# Patient Record
Sex: Male | Born: 1940 | Race: White | Hispanic: No | Marital: Married | Smoking: Former smoker
Health system: Southern US, Community
[De-identification: ages and names within clinical notes are randomized; demographics above are authoritative.]

## PROBLEM LIST (undated history)

## (undated) DIAGNOSIS — D696 Thrombocytopenia, unspecified: Secondary | ICD-10-CM

## (undated) DIAGNOSIS — K56609 Unspecified intestinal obstruction, unspecified as to partial versus complete obstruction: Secondary | ICD-10-CM

## (undated) DIAGNOSIS — R011 Cardiac murmur, unspecified: Secondary | ICD-10-CM

## (undated) DIAGNOSIS — R519 Headache, unspecified: Secondary | ICD-10-CM

## (undated) DIAGNOSIS — J439 Emphysema, unspecified: Secondary | ICD-10-CM

## (undated) DIAGNOSIS — I2510 Atherosclerotic heart disease of native coronary artery without angina pectoris: Secondary | ICD-10-CM

## (undated) DIAGNOSIS — Z8679 Personal history of other diseases of the circulatory system: Secondary | ICD-10-CM

## (undated) DIAGNOSIS — K7460 Unspecified cirrhosis of liver: Secondary | ICD-10-CM

## (undated) DIAGNOSIS — R51 Headache: Secondary | ICD-10-CM

## (undated) DIAGNOSIS — I5033 Acute on chronic diastolic (congestive) heart failure: Secondary | ICD-10-CM

## (undated) DIAGNOSIS — Z860100 Personal history of colon polyps, unspecified: Secondary | ICD-10-CM

## (undated) DIAGNOSIS — M1990 Unspecified osteoarthritis, unspecified site: Secondary | ICD-10-CM

## (undated) DIAGNOSIS — K5790 Diverticulosis of intestine, part unspecified, without perforation or abscess without bleeding: Secondary | ICD-10-CM

## (undated) DIAGNOSIS — K4090 Unilateral inguinal hernia, without obstruction or gangrene, not specified as recurrent: Secondary | ICD-10-CM

## (undated) DIAGNOSIS — I4891 Unspecified atrial fibrillation: Secondary | ICD-10-CM

## (undated) DIAGNOSIS — Z9289 Personal history of other medical treatment: Secondary | ICD-10-CM

## (undated) DIAGNOSIS — D649 Anemia, unspecified: Secondary | ICD-10-CM

## (undated) DIAGNOSIS — E785 Hyperlipidemia, unspecified: Secondary | ICD-10-CM

## (undated) DIAGNOSIS — C3490 Malignant neoplasm of unspecified part of unspecified bronchus or lung: Secondary | ICD-10-CM

## (undated) DIAGNOSIS — J449 Chronic obstructive pulmonary disease, unspecified: Secondary | ICD-10-CM

## (undated) DIAGNOSIS — K420 Umbilical hernia with obstruction, without gangrene: Secondary | ICD-10-CM

## (undated) DIAGNOSIS — I714 Abdominal aortic aneurysm, without rupture, unspecified: Secondary | ICD-10-CM

## (undated) DIAGNOSIS — K219 Gastro-esophageal reflux disease without esophagitis: Secondary | ICD-10-CM

## (undated) DIAGNOSIS — Z923 Personal history of irradiation: Secondary | ICD-10-CM

## (undated) DIAGNOSIS — E039 Hypothyroidism, unspecified: Secondary | ICD-10-CM

## (undated) DIAGNOSIS — L719 Rosacea, unspecified: Secondary | ICD-10-CM

## (undated) DIAGNOSIS — K2960 Other gastritis without bleeding: Secondary | ICD-10-CM

## (undated) DIAGNOSIS — S0990XA Unspecified injury of head, initial encounter: Secondary | ICD-10-CM

## (undated) DIAGNOSIS — N400 Enlarged prostate without lower urinary tract symptoms: Secondary | ICD-10-CM

## (undated) DIAGNOSIS — I219 Acute myocardial infarction, unspecified: Secondary | ICD-10-CM

## (undated) DIAGNOSIS — Z9221 Personal history of antineoplastic chemotherapy: Secondary | ICD-10-CM

## (undated) DIAGNOSIS — R0602 Shortness of breath: Secondary | ICD-10-CM

## (undated) HISTORY — DX: Other gastritis without bleeding: K29.60

## (undated) HISTORY — DX: Malignant neoplasm of unspecified part of unspecified bronchus or lung: C34.90

## (undated) HISTORY — DX: Rosacea, unspecified: L71.9

## (undated) HISTORY — PX: CATARACT EXTRACTION, BILATERAL: SHX1313

## (undated) HISTORY — PX: COLONOSCOPY W/ BIOPSIES: SHX1374

## (undated) HISTORY — DX: Unspecified cirrhosis of liver: K74.60

## (undated) HISTORY — PX: JOINT REPLACEMENT: SHX530

## (undated) HISTORY — DX: Diverticulosis of intestine, part unspecified, without perforation or abscess without bleeding: K57.90

## (undated) HISTORY — PX: TONSILLECTOMY: SUR1361

## (undated) HISTORY — DX: Personal history of irradiation: Z92.3

## (undated) HISTORY — PX: HERNIA REPAIR: SHX51

## (undated) HISTORY — DX: Personal history of other medical treatment: Z92.89

## (undated) HISTORY — DX: Gastro-esophageal reflux disease without esophagitis: K21.9

## (undated) HISTORY — DX: Abdominal aortic aneurysm, without rupture, unspecified: I71.40

## (undated) HISTORY — DX: Atherosclerotic heart disease of native coronary artery without angina pectoris: I25.10

## (undated) HISTORY — PX: VASECTOMY: SHX75

## (undated) HISTORY — DX: Acute on chronic diastolic (congestive) heart failure: I50.33

## (undated) HISTORY — DX: Anemia, unspecified: D64.9

## (undated) HISTORY — DX: Chronic obstructive pulmonary disease, unspecified: J44.9

## (undated) HISTORY — PX: OTHER SURGICAL HISTORY: SHX169

## (undated) HISTORY — DX: Emphysema, unspecified: J43.9

## (undated) HISTORY — DX: Abdominal aortic aneurysm, without rupture: I71.4

## (undated) HISTORY — DX: Thrombocytopenia, unspecified: D69.6

## (undated) HISTORY — DX: Acute myocardial infarction, unspecified: I21.9

---

## 2000-11-22 ENCOUNTER — Ambulatory Visit (HOSPITAL_COMMUNITY): Admission: RE | Admit: 2000-11-22 | Discharge: 2000-11-22 | Payer: Self-pay | Admitting: *Deleted

## 2000-11-22 ENCOUNTER — Encounter (INDEPENDENT_AMBULATORY_CARE_PROVIDER_SITE_OTHER): Payer: Self-pay

## 2001-08-31 HISTORY — PX: CARDIAC CATHETERIZATION: SHX172

## 2001-12-02 ENCOUNTER — Encounter: Payer: Self-pay | Admitting: *Deleted

## 2001-12-02 ENCOUNTER — Ambulatory Visit (HOSPITAL_COMMUNITY): Admission: RE | Admit: 2001-12-02 | Discharge: 2001-12-02 | Payer: Self-pay | Admitting: *Deleted

## 2004-08-31 HISTORY — PX: ESOPHAGOGASTRODUODENOSCOPY: SHX1529

## 2005-02-21 ENCOUNTER — Ambulatory Visit (HOSPITAL_COMMUNITY): Admission: EM | Admit: 2005-02-21 | Discharge: 2005-02-21 | Payer: Self-pay | Admitting: Emergency Medicine

## 2005-03-16 ENCOUNTER — Encounter: Admission: RE | Admit: 2005-03-16 | Discharge: 2005-03-16 | Payer: Self-pay | Admitting: Family Medicine

## 2005-04-24 ENCOUNTER — Ambulatory Visit (HOSPITAL_COMMUNITY): Admission: RE | Admit: 2005-04-24 | Discharge: 2005-04-24 | Payer: Self-pay | Admitting: *Deleted

## 2005-04-24 ENCOUNTER — Encounter (INDEPENDENT_AMBULATORY_CARE_PROVIDER_SITE_OTHER): Payer: Self-pay | Admitting: Specialist

## 2005-09-25 ENCOUNTER — Ambulatory Visit (HOSPITAL_COMMUNITY): Admission: RE | Admit: 2005-09-25 | Discharge: 2005-09-25 | Payer: Self-pay | Admitting: *Deleted

## 2005-12-14 ENCOUNTER — Encounter: Admission: RE | Admit: 2005-12-14 | Discharge: 2005-12-14 | Payer: Self-pay | Admitting: Family Medicine

## 2006-07-19 ENCOUNTER — Encounter: Admission: RE | Admit: 2006-07-19 | Discharge: 2006-07-19 | Payer: Self-pay | Admitting: Family Medicine

## 2006-08-31 HISTORY — PX: KNEE ARTHROSCOPY: SUR90

## 2006-08-31 HISTORY — PX: FINGER SURGERY: SHX640

## 2007-06-22 ENCOUNTER — Ambulatory Visit (HOSPITAL_COMMUNITY): Admission: RE | Admit: 2007-06-22 | Discharge: 2007-06-22 | Payer: Self-pay | Admitting: *Deleted

## 2007-06-22 ENCOUNTER — Encounter (INDEPENDENT_AMBULATORY_CARE_PROVIDER_SITE_OTHER): Payer: Self-pay | Admitting: *Deleted

## 2007-09-08 ENCOUNTER — Encounter: Admission: RE | Admit: 2007-09-08 | Discharge: 2007-09-08 | Payer: Self-pay | Admitting: Family Medicine

## 2008-06-13 ENCOUNTER — Ambulatory Visit (HOSPITAL_COMMUNITY): Admission: RE | Admit: 2008-06-13 | Discharge: 2008-06-13 | Payer: Self-pay | Admitting: Specialist

## 2008-11-28 ENCOUNTER — Encounter: Admission: RE | Admit: 2008-11-28 | Discharge: 2008-11-28 | Payer: Self-pay | Admitting: Family Medicine

## 2009-08-13 ENCOUNTER — Encounter: Admission: RE | Admit: 2009-08-13 | Discharge: 2009-08-13 | Payer: Self-pay | Admitting: Family Medicine

## 2010-09-23 ENCOUNTER — Encounter
Admission: RE | Admit: 2010-09-23 | Discharge: 2010-09-23 | Payer: Self-pay | Source: Home / Self Care | Attending: Family Medicine | Admitting: Family Medicine

## 2011-01-13 NOTE — Op Note (Signed)
NAME:  Joseph [name], Joseph [name]          ACCOUNT NO.:  [id number]   MEDICAL RECORD NO.:  [id number]          PATIENT TYPE:  AMB   LOCATION:  ENDO                         FACILITY:  [facility]   PHYSICIAN:  Georgiana Spinner, M.D.    DATE OF BIRTH:  28-Nov-1940   DATE OF PROCEDURE:  06/22/2007  DATE OF DISCHARGE:                               OPERATIVE REPORT   PROCEDURE:  Colonoscopy.   INDICATIONS:  Colon polyp.   ANESTHESIA:  Fentanyl 100 mcg, Versed 9 mg.   PROCEDURE IN DETAIL:  With the patient mildly sedated in the left  lateral decubitus position, a rectal examination was performed which was  unremarkable to my examination other than external skin tags.  Subsequently, the Pentax videoscopic colonoscope was inserted into the  rectum, passed under direct vision to the cecum, identified by the  ileocecal valve and base of the cecum, both of which were photographed.  From this point, the colonoscope was slowly withdrawn taking  circumferential views of the colonic mucosa, stopping in the ascending  colon where a polyp was seen, photographed, and removed using hot biopsy  forceps technique at a setting of 20/150 blended current.  The endoscope  was then withdrawn taking circumferential views of the remaining colonic  mucosa stopping in the rectum which appeared normal on direct and showed  hemorrhoidal tissue on retroflexed view. The endoscope was straightened  and withdrawn.  The patient's vital signs and pulse oximeter remained  stable.  The patient tolerated the procedure well without apparent  complications.   FINDINGS:  A polyp of ascending colon, diverticulosis, mild sigmoid  colon, internal hemorrhoids.   PLAN:  Await biopsy report.  The patient will call me for results and  follow up with me as needed as an outpatient.           ______________________________  Georgiana Spinner, M.D.     GMO/MEDQ  D:  06/22/2007  T:  06/22/2007  Job:  [id number]

## 2011-01-13 NOTE — Op Note (Signed)
NAME:  Joseph [name], Joseph [name]          ACCOUNT NO.:  [id number]   MEDICAL RECORD NO.:  [id number]          PATIENT TYPE:  AMB   LOCATION:  ENDO                         FACILITY:  [facility]   PHYSICIAN:  Georgiana Spinner, M.D.    DATE OF BIRTH:  25-May-1941   DATE OF PROCEDURE:  06/22/2007  DATE OF DISCHARGE:                               OPERATIVE REPORT   ADDENDUM:  Add a carbon copy of that previous report to Dr. Marjory Lies at [facility].           ______________________________  Georgiana Spinner, M.D.     GMO/MEDQ  D:  06/22/2007  T:  06/22/2007  Job:  [id number]

## 2011-01-16 NOTE — Op Note (Signed)
NAME:  Joseph [name], Joseph [name]          ACCOUNT NO.:  [id number]   MEDICAL RECORD NO.:  [id number]          PATIENT TYPE:  AMB   LOCATION:  ENDO                         FACILITY:  [facility]   PHYSICIAN:  Georgiana Spinner, M.D.    DATE OF BIRTH:  10-16-1940   DATE OF PROCEDURE:  09/25/2005  DATE OF DISCHARGE:                                 OPERATIVE REPORT   PROCEDURE:  Colonoscopy with eradication of polyp tumor   ANESTHESIA:  Demerol 50 Versed 5 mg.   DESCRIPTION OF PROCEDURE:  With the patient mildly sedated in the left  lateral decubitus position. A rectal examination was performed which was  unremarkable. Subsequently the Olympus videoscopic colonoscope was inserted  into the rectum, passed under direct vision to the cecum, identified by  ileocecal valve and appendiceal orifice, both of which were photographed.  From this point, the colonoscope was slowly withdrawn taking circumferential  views of the colonic mucosa; stopping, actually two folds removed from the  ileocecal valve. At which point, a previously noted polyp was seen and  looked like just a thickened fold, but on closer inspection it did appear  that there was polypoid tissue here. This was photographed; and using the  ERBE argon photocoagulator, we eradicated this.  Actually the polypoid  tissue extended over top of the fold and then lapped into the valley between  two folds and we eradicate it in that area as well. Once, with close  inspection, I had managed to evaluate and see that there was no polypoid  tissue remaining to my view.   I elected to terminate the argon photocoagulation and withdrew the  colonoscope, as noted, taking circumferential views of the remaining colonic  mucosa; stopping, only, when we reached the rectum, which appeared normal on  direct and showed hemorrhoids on retroflex view. The endoscope was  straightened, withdrawn. The patient's vital signs and pulse oximeter  remained stable. The patient  tolerated procedure well without apparent  complications.   FINDINGS:  1.  Polyp of ascending colon, two folds removed from the ileocecal valve.  2.  Diverticulosis of sigmoid colon and internal hemorrhoids.   PLAN:  Await clinical response and have patient follow up with me as an  outpatient.           ______________________________  Georgiana Spinner, M.D.     GMO/MEDQ  D:  09/25/2005  T:  09/25/2005  Job:  [id number]

## 2011-01-16 NOTE — Op Note (Signed)
Joseph [name], Joseph [name]          ACCOUNT NO.:  [id number]   MEDICAL RECORD NO.:  [id number]          PATIENT TYPE:  AMB   LOCATION:  ENDO                         FACILITY:  [facility]   PHYSICIAN:  Georgiana Spinner, M.D.    DATE OF BIRTH:  1940/12/01   DATE OF PROCEDURE:  04/24/2005  DATE OF DISCHARGE:                                 OPERATIVE REPORT   PROCEDURE:  Colonoscopy.   INDICATIONS:  Colon polyp.   ANESTHESIA:  None further given.   DESCRIPTION OF PROCEDURE:  With the patient mildly sedated in the left  lateral decubitus position, the Olympus videoscopic colonoscope was inserted  into the rectum after a rectal examination was performed which revealed the  prostate to be smooth but slightly enlarged. The colonoscope was then passed  under direct vision to the cecum, identified by crow's foot of the cecum and  ileocecal valve both of which were photographed. Approximately one fold  removed from the ileocecal valve, there was a thickened area on a fold,  question of whether this was a polyp could not be clearly delineated,  therefore I elected to biopsy this at this time rather than using ERBE to  eradicate a possible normal fold. The endoscope was then withdrawn taking  circumferential views of the remaining colonic mucosa stopping in the rectum  which appeared normal on direct and showed hemorrhoids on retroflexed view.  The endoscope was straightened and withdrawn. The patient's vital signs and  pulse oximeter remained stable. The patient tolerated the procedure well  without apparent complications.   FINDINGS:  Internal hemorrhoids, diverticulosis of sigmoid colon moderately  severe, question of polyp of the ascending colon one fold removed from the  ileocecal valve.   PLAN:  Await biopsy report. The patient will call me for results and follow-  up with me as an outpatient. If tissue proves to be adenomatous will need to  repeat this examination with plans to eradicate the  thickened fold area.           ______________________________  Georgiana Spinner, M.D.     GMO/MEDQ  D:  04/24/2005  T:  04/24/2005  Job:  [id number]   cc:   Marjory Lies, M.D.  [address]  [place]  [place] [address]  Fax: [phone]

## 2011-01-16 NOTE — Procedures (Signed)
[facility]  Patient:    Joseph [name], Joseph [name]                   MRN: [id number] Proc. Date: 11/22/00 Adm. Date:  [id number] Attending:  Sabino Gasser CCNolon Nations, M.D., M.P.H.   Procedure Report  PROCEDURE:  Colonoscopy.  GASTROENTEROLOGIST:  Sabino Gasser, M.D.  INDICATIONS:  Rectal bleeding and colon cancer screening.  ANESTHESIA:  Demerol 60 mg, Versed 6 mg.  PROCEDURE IN DETAIL:  With the patient mildly sedated and in the left lateral decubitus position, the Olympus video colonoscope was inserted in the rectum after rectal exam and passed under direct vision to the cecum identified by ileocecal valve and appendiceal orifice, both of which were photographed. From this point, the colonoscope was slowly withdrawn, taking circumferential views of the entire colonic mucosa, stopping to photograph diverticulosis seen along the way throughout the colon, both right and left sided.  The endoscope was pulled back to approximately 20 cm from the anal verge at which point a polyp was seen.  It was on a short stalk.  It was photographed, and it was removed using snare cautery technique at the setting of 20/20 blended current. There was good hemostasis.  The endoscope was withdrawn to the rectum which appeared normal on direct view and showed internal hemorrhoid in retroflexed view.  The endoscope was straightened and withdrawn.  The patients vital signs and pulse oximetry remained stable.  The patient tolerated the procedure well with no apparent complications.  FINDINGS: 1. Diverticulosis throughout the colon. 2. Internal hemorrhoids. 3. Polyp at 20 cm.  PLAN:  Await biopsy report.  The patient will call me for results and follow up with me as indicated as an outpatient. DD:  11/22/00 TD:  11/22/00 Job: [id number]

## 2011-01-16 NOTE — Op Note (Signed)
NAMETHEORDORE, CISNERO          ACCOUNT NO.:  [id number]   MEDICAL RECORD NO.:  [id number]          PATIENT TYPE:  INP   LOCATION:  1830                         FACILITY:  [facility]   PHYSICIAN:  Mark C. Ophelia Charter, M.D.    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   PROCEDURE:  Irrigation, excisional debridement open fracture left index  finger distal phalanx, open reduction internal fixation, nailbed repair and  closure.   ANESTHESIA:  Preoperative digital block plus repeat digital block  supplementation.   TOURNIQUET:  None.   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  He is right hand dominant and  had amputation through the base of the nailbed and transversely across the  distal phalanx.  Patient had preoperative digital block by anesthesia or  wrist block.  After irrigation and sharp excisional debridement of some  contaminated tissue, removal of small comminuted fragments at the level of  the fracture site, inspection revealed that he had still intact ulnar  digital artery nerve with complete laceration through the base of the  nailbed of the radial digital artery and nerve, not reparable.  Fracture was  distracted, sharply debrided and an L4-5 K-wire was drilled antegrade  followed by retrograde with reduction checked under fluoroscopy.  Nailbed  was then repaired, replaced in a nail plate and repaired with 5-0 nylon  sutures interrupted simple.  Laceration was closed and Xeroform, 4 x 4s, 1  inch Kling, 1  inch Coban was then applied.  The patient tolerated the  procedure well and was transferred in the recovery room in stable condition  and will be discharged home.       MCY/MEDQ  D:  02/21/2005  T:  02/22/2005  Job:  [id number]

## 2011-01-16 NOTE — Cardiovascular Report (Signed)
[facility]  Patient:    Joseph [name], Joseph [name] Visit Number: [id number] MRN: [id number]          Service Type: CAT Location: [facility] [id number] Attending Physician:  Daisey Must Dictated by:   Daisey Must, M.D. [facility] Proc. Date: 12/02/01 Admit Date:  12/02/2001 Discharge Date: 12/02/2001   CC:         [name], M.D.  Lewayne Bunting, M.D. [facility]  Cardiac Catheterization Laboratory   Cardiac Catheterization  PROCEDURES PERFORMED: Left heart catheterization with coronary angiography and left ventriculography.  INDICATIONS: The patient is a 69 year old male with multiple cardiac risk factors. He has been having chest pain. A stress Cardiolite scan showed mild inferior ischemia.  DESCRIPTION OF PROCEDURE: A 6 French sheath was placed in the right femoral artery.  Standard Judkins 6 French catheters were utilized.  Contrast was Omnipaque.  There were no complications.  RESULTS:  HEMODYNAMICS: Left ventricular pressure 120/12, aortic pressure 120/72.  There was no aortic valve gradient.  LEFT VENTRICULOGRAM: Wall motion is normal. Ejection fraction calculated at 63%. There is trace mitral regurgitation.  CORONARY ARTERIOGRAPHY: (Right dominant).  Left main is a long vessel with a mid 20% stenosis.  Left anterior descending artery has a diffuse 20% stenosis in the mid vessel. It gives rise to three small diagonal branches.  Left circumflex gives rise to a very large trifurcating first obtuse marginal branch which supplies the majority of the posterolateral wall. The distal circumflex is relatively small. The left circumflex is normal.  Right coronary artery is a relatively small caliber vessel. There is a 30% stenosis in the proximal vessel, 30% in the mid vessel. The distal right coronary artery gives rise to a normal sized posterior descending artery and a small posterolateral branch.  IMPRESSIONS: 1. Normal left ventricular  systolic function. 2. Mild but hemodynamically insignificant coronary artery disease.  PLAN: The patient will be managed medically. Disease as described. Dictated by:   Daisey Must, M.D. [facility] Attending Physician:  Daisey Must DD:  12/02/01 TD:  11/03/01 Job: [id number]

## 2011-01-16 NOTE — Op Note (Signed)
Joseph [name], Joseph [name]          ACCOUNT NO.:  [id number]   MEDICAL RECORD NO.:  [id number]          PATIENT TYPE:  AMB   LOCATION:  ENDO                         FACILITY:  [facility]   PHYSICIAN:  Georgiana Spinner, M.D.    DATE OF BIRTH:  10/23/1940   DATE OF PROCEDURE:  04/24/2005  DATE OF DISCHARGE:                                 OPERATIVE REPORT   PROCEDURE:  Upper endoscopy.   INDICATIONS:  Gastroesophageal reflux disease.   ANESTHESIA:  Demerol 50, Versed 4 mg.   DESCRIPTION OF PROCEDURE:  With the patient mildly sedated in the left  lateral decubitus position, the Olympus videoscopic endoscope was inserted  in the mouth and passed under direct vision through the esophagus which  appeared normal. There was no evidence of Barrett's esophagus. We entered  into the stomach. The fundus, body, antrum, duodenal bulb, and second  portion of duodenum appeared normal. From this point, the endoscope was  slowly withdrawn taking circumferential views of the duodenal mucosa until  the endoscope had been pulled back into the stomach, placed in retroflexion  to view the stomach from below. The endoscope was straightened and withdrawn  taking circumferential views of the remaining gastric and esophageal mucosa.  The patient's vital signs pulse and oximeter remained stable. The patient  tolerated the procedure well without apparent complications.   FINDINGS:  Unremarkable examination.   PLAN.:  Proceed to colonoscopy.           ______________________________  Georgiana Spinner, M.D.     GMO/MEDQ  D:  04/24/2005  T:  04/24/2005  Job:  [id number]   cc:   Marjory Lies, M.D.  [address]  [place]  [place] [address]  Fax: [phone]

## 2011-07-02 DIAGNOSIS — C3490 Malignant neoplasm of unspecified part of unspecified bronchus or lung: Secondary | ICD-10-CM

## 2011-07-02 HISTORY — DX: Malignant neoplasm of unspecified part of unspecified bronchus or lung: C34.90

## 2011-07-17 ENCOUNTER — Other Ambulatory Visit: Payer: Self-pay | Admitting: Family Medicine

## 2011-07-17 DIAGNOSIS — R222 Localized swelling, mass and lump, trunk: Secondary | ICD-10-CM

## 2011-07-20 ENCOUNTER — Ambulatory Visit
Admission: RE | Admit: 2011-07-20 | Discharge: 2011-07-20 | Disposition: A | Discharge status: Home / Self Care | Payer: Medicare Other | Source: Ambulatory Visit | Attending: Family Medicine | Admitting: Family Medicine

## 2011-07-20 DIAGNOSIS — R222 Localized swelling, mass and lump, trunk: Secondary | ICD-10-CM

## 2011-07-20 MED ORDER — IOHEXOL 300 MG/ML  SOLN
75.0000 mL | Freq: Once | INTRAMUSCULAR | Status: AC | PRN
  Administered 2011-07-20: 75 mL via INTRAVENOUS

## 2011-07-28 ENCOUNTER — Other Ambulatory Visit (HOSPITAL_COMMUNITY): Payer: Self-pay | Admitting: Family Medicine

## 2011-07-28 DIAGNOSIS — R222 Localized swelling, mass and lump, trunk: Secondary | ICD-10-CM

## 2011-07-30 ENCOUNTER — Other Ambulatory Visit: Payer: Self-pay | Admitting: Thoracic Surgery

## 2011-07-30 ENCOUNTER — Institutional Professional Consult (permissible substitution) (INDEPENDENT_AMBULATORY_CARE_PROVIDER_SITE_OTHER): Payer: Medicare Other | Admitting: Thoracic Surgery

## 2011-07-30 VITALS — BP 178/107 | HR 104 | Resp 20 | Ht 72.0 in | Wt 208.0 lb

## 2011-07-30 DIAGNOSIS — R42 Dizziness and giddiness: Secondary | ICD-10-CM

## 2011-07-30 DIAGNOSIS — D381 Neoplasm of uncertain behavior of trachea, bronchus and lung: Secondary | ICD-10-CM

## 2011-07-30 DIAGNOSIS — C7931 Secondary malignant neoplasm of brain: Secondary | ICD-10-CM

## 2011-07-30 DIAGNOSIS — R918 Other nonspecific abnormal finding of lung field: Secondary | ICD-10-CM

## 2011-07-30 DIAGNOSIS — R222 Localized swelling, mass and lump, trunk: Secondary | ICD-10-CM

## 2011-07-30 NOTE — Progress Notes (Signed)
PCP is No primary provider on file. Referring Provider is Burnett, Brent A., MD  Chief Complaint  Patient presents with  . Lung Mass    Referral from Dr Burnett for surgical eval on RUL Mass, liver lesion, Chest CT 07/17/11, PET Scan sch'ed for 08/07/11    HPI: This 70-year-old Caucasian male was found to have a right upper lobe mass. CT scan showed a 2-1/2 cm right upper lobe mass with the mediastinal adenopathy a PET scan is pending. He quit smoking about a month ago because of increasing shortness of breath. He's had no hemoptysis fever chills or excessive sputum he is. He is referred here for evaluation. The plan to MRI of the brain, PET scan, and pulmonary function tests. We will schedule her for bronchoscopy with endobronchial ultrasound and electromagnetic navigation on December 5 the chances assessment of the procedure were discussed. The risk of the procedure were discussed.   No past medical history on file.  No past surgical history on file.  No family history on file.  Social History History  Substance Use Topics  . Smoking status: Former Smoker    Types: Cigarettes    Quit date: 07/02/2011  . Smokeless tobacco: Former User  . Alcohol Use: Yes     soical    Current Outpatient Prescriptions  Medication Sig Dispense Refill  . CHANTIX 1 MG tablet 1 mg 2 (two) times daily.       . nystatin-triamcinolone (MYCOLOG II) cream       . pravastatin (PRAVACHOL) 40 MG tablet Take 40 mg by mouth daily.       . RAPAFLO 8 MG CAPS capsule Take 8 mg by mouth daily with breakfast.       . SYNTHROID 175 MCG tablet Take 175 mcg by mouth daily.         No Known Allergies  Review of Systems  Constitutional: Negative.   Eyes: Negative.   Respiratory: Positive for shortness of breath and wheezing.   Cardiovascular: Negative.   Gastrointestinal: Negative.   Genitourinary: Positive for frequency.  Musculoskeletal: Positive for arthralgias.  Neurological: Negative.   Hematological:  Negative.   Psychiatric/Behavioral: Negative.     BP 178/107  Pulse 104  Resp 20  Ht 6' (1.829 m)  Wt 208 lb (94.348 kg)  BMI 28.21 kg/m2  SpO2 93% Physical Exam  Constitutional: He is oriented to person, place, and time. He appears well-developed and well-nourished.  HENT:  Head: Normocephalic and atraumatic.  Right Ear: External ear normal.  Left Ear: External ear normal.  Mouth/Throat: Oropharynx is clear and moist.  Eyes: Conjunctivae and EOM are normal. Pupils are equal, round, and reactive to light.  Neck: Normal range of motion. Neck supple. Thyromegaly present.  Cardiovascular: Normal rate, regular rhythm, normal heart sounds and intact distal pulses.   Pulmonary/Chest: Effort normal. He has wheezes.  Abdominal: Soft. Bowel sounds are normal.  Musculoskeletal: Normal range of motion.  Neurological: He is alert and oriented to person, place, and time. He has normal reflexes.  Skin: Skin is warm and dry.  Psychiatric: He has a normal mood and affect. His behavior is normal. Judgment and thought content normal.     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  

## 2011-07-31 ENCOUNTER — Other Ambulatory Visit: Payer: Self-pay

## 2011-07-31 DIAGNOSIS — D381 Neoplasm of uncertain behavior of trachea, bronchus and lung: Secondary | ICD-10-CM

## 2011-07-31 LAB — BUN: BUN: 11 mg/dL (ref 6–23)

## 2011-07-31 LAB — CREATININE, SERUM: Creat: 0.71 mg/dL (ref 0.50–1.35)

## 2011-08-03 ENCOUNTER — Ambulatory Visit (HOSPITAL_COMMUNITY)
Admission: RE | Admit: 2011-08-03 | Discharge: 2011-08-03 | Disposition: A | Discharge status: Home / Self Care | Payer: Medicare Other | Source: Ambulatory Visit | Attending: Thoracic Surgery | Admitting: Thoracic Surgery

## 2011-08-03 ENCOUNTER — Encounter (HOSPITAL_COMMUNITY)
Admission: RE | Admit: 2011-08-03 | Discharge: 2011-08-03 | Disposition: A | Discharge status: Home / Self Care | Payer: Medicare Other | Source: Ambulatory Visit | Attending: Family Medicine | Admitting: Family Medicine

## 2011-08-03 ENCOUNTER — Encounter (HOSPITAL_COMMUNITY): Payer: Self-pay

## 2011-08-03 DIAGNOSIS — R062 Wheezing: Secondary | ICD-10-CM | POA: Insufficient documentation

## 2011-08-03 DIAGNOSIS — F17200 Nicotine dependence, unspecified, uncomplicated: Secondary | ICD-10-CM | POA: Insufficient documentation

## 2011-08-03 DIAGNOSIS — I714 Abdominal aortic aneurysm, without rupture, unspecified: Secondary | ICD-10-CM | POA: Insufficient documentation

## 2011-08-03 DIAGNOSIS — K7460 Unspecified cirrhosis of liver: Secondary | ICD-10-CM | POA: Insufficient documentation

## 2011-08-03 DIAGNOSIS — R222 Localized swelling, mass and lump, trunk: Secondary | ICD-10-CM

## 2011-08-03 DIAGNOSIS — K4090 Unilateral inguinal hernia, without obstruction or gangrene, not specified as recurrent: Secondary | ICD-10-CM | POA: Insufficient documentation

## 2011-08-03 DIAGNOSIS — R0609 Other forms of dyspnea: Secondary | ICD-10-CM | POA: Insufficient documentation

## 2011-08-03 DIAGNOSIS — I2510 Atherosclerotic heart disease of native coronary artery without angina pectoris: Secondary | ICD-10-CM | POA: Insufficient documentation

## 2011-08-03 DIAGNOSIS — I723 Aneurysm of iliac artery: Secondary | ICD-10-CM | POA: Insufficient documentation

## 2011-08-03 DIAGNOSIS — R599 Enlarged lymph nodes, unspecified: Secondary | ICD-10-CM | POA: Insufficient documentation

## 2011-08-03 DIAGNOSIS — J988 Other specified respiratory disorders: Secondary | ICD-10-CM | POA: Insufficient documentation

## 2011-08-03 DIAGNOSIS — R0989 Other specified symptoms and signs involving the circulatory and respiratory systems: Secondary | ICD-10-CM | POA: Insufficient documentation

## 2011-08-03 DIAGNOSIS — D381 Neoplasm of uncertain behavior of trachea, bronchus and lung: Secondary | ICD-10-CM

## 2011-08-03 LAB — PULMONARY FUNCTION TEST

## 2011-08-03 LAB — GLUCOSE, CAPILLARY: Glucose-Capillary: 98 mg/dL (ref 70–99)

## 2011-08-03 MED ORDER — FLUDEOXYGLUCOSE F - 18 (FDG) INJECTION
19.6000 | Freq: Once | INTRAVENOUS | Status: AC | PRN
  Administered 2011-08-03: 19.6 via INTRAVENOUS

## 2011-08-04 ENCOUNTER — Encounter (HOSPITAL_COMMUNITY): Payer: Self-pay

## 2011-08-04 ENCOUNTER — Other Ambulatory Visit: Payer: Medicare Other

## 2011-08-04 ENCOUNTER — Ambulatory Visit
Admission: RE | Admit: 2011-08-04 | Discharge: 2011-08-04 | Disposition: A | Discharge status: Home / Self Care | Payer: Medicare Other | Source: Ambulatory Visit | Attending: Thoracic Surgery | Admitting: Thoracic Surgery

## 2011-08-04 ENCOUNTER — Encounter (HOSPITAL_COMMUNITY)
Admission: RE | Admit: 2011-08-04 | Discharge: 2011-08-04 | Disposition: A | Discharge status: Home / Self Care | Payer: Medicare Other | Source: Ambulatory Visit | Attending: Thoracic Surgery | Admitting: Thoracic Surgery

## 2011-08-04 ENCOUNTER — Ambulatory Visit (HOSPITAL_COMMUNITY)
Admission: RE | Admit: 2011-08-04 | Discharge: 2011-08-04 | Disposition: A | Discharge status: Home / Self Care | Payer: Medicare Other | Source: Ambulatory Visit | Attending: Thoracic Surgery | Admitting: Thoracic Surgery

## 2011-08-04 DIAGNOSIS — R42 Dizziness and giddiness: Secondary | ICD-10-CM

## 2011-08-04 DIAGNOSIS — D381 Neoplasm of uncertain behavior of trachea, bronchus and lung: Secondary | ICD-10-CM

## 2011-08-04 HISTORY — DX: Shortness of breath: R06.02

## 2011-08-04 HISTORY — DX: Personal history of colon polyps, unspecified: Z86.0100

## 2011-08-04 HISTORY — DX: Unspecified osteoarthritis, unspecified site: M19.90

## 2011-08-04 HISTORY — DX: Hypothyroidism, unspecified: E03.9

## 2011-08-04 HISTORY — DX: Personal history of colonic polyps: Z86.010

## 2011-08-04 HISTORY — DX: Benign prostatic hyperplasia without lower urinary tract symptoms: N40.0

## 2011-08-04 HISTORY — DX: Hyperlipidemia, unspecified: E78.5

## 2011-08-04 LAB — APTT: aPTT: 35 seconds (ref 24–37)

## 2011-08-04 LAB — CBC
HCT: 47 % (ref 39.0–52.0)
MCHC: 34.7 g/dL (ref 30.0–36.0)
MCV: 94.2 fL (ref 78.0–100.0)
RDW: 13.3 % (ref 11.5–15.5)

## 2011-08-04 LAB — COMPREHENSIVE METABOLIC PANEL
Albumin: 3.5 g/dL (ref 3.5–5.2)
BUN: 11 mg/dL (ref 6–23)
Creatinine, Ser: 0.7 mg/dL (ref 0.50–1.35)
Total Protein: 7.6 g/dL (ref 6.0–8.3)

## 2011-08-04 LAB — PROTIME-INR
INR: 1.07 (ref 0.00–1.49)
Prothrombin Time: 14.1 seconds (ref 11.6–15.2)

## 2011-08-04 LAB — SURGICAL PCR SCREEN
MRSA, PCR: NEGATIVE
Staphylococcus aureus: NEGATIVE

## 2011-08-04 MED ORDER — GADOBENATE DIMEGLUMINE 529 MG/ML IV SOLN
10.0000 mL | Freq: Once | INTRAVENOUS | Status: AC | PRN
  Administered 2011-08-04: 10 mL via INTRAVENOUS

## 2011-08-04 NOTE — Pre-Procedure Instructions (Signed)
[address]  08/04/2011   Your procedure is scheduled on: Wed,Dec 5 @ 0830 Report to [facility] at 0630 AM.  Call this number if you have problems the morning of surgery: [phone]   Remember:   Do not eat food:After Midnight.  May have clear liquids: up to 4 Hours before arrival.(until 2:30am)  Clear liquids include soda, tea, black coffee, apple or grape juice, broth.  Take these medicines the morning of surgery with A SIP OF WATER: Synthroid   Do not wear jewelry, make-up or nail polish.  Do not wear lotions, powders, or perfumes. You may wear deodorant.  Do not shave 48 hours prior to surgery.  Do not bring valuables to the hospital.  Contacts, dentures or bridgework may not be worn into surgery.  Leave suitcase in the car. After surgery it may be brought to your room.  For patients admitted to the hospital, checkout time is 11:00 AM the day of discharge.   Patients discharged the day of surgery will not be allowed to drive home.  Name and phone number of your driver:   Special Instructions: CHG Shower Use Special Wash: 1/2 bottle night before surgery and 1/2 bottle morning of surgery.   Please read over the following fact sheets that you were given: Pain Booklet, Coughing and Deep Breathing, MRSA Information and Surgical Site Infection Prevention

## 2011-08-04 NOTE — Progress Notes (Signed)
Stress test and heart cath done in 2003-report of heart cath in epic

## 2011-08-04 NOTE — Progress Notes (Signed)
EKG requested from Dr.Burnette @ [facility] in [place]

## 2011-08-05 ENCOUNTER — Ambulatory Visit (HOSPITAL_COMMUNITY): Payer: Medicare Other

## 2011-08-05 ENCOUNTER — Other Ambulatory Visit: Payer: Self-pay | Admitting: Thoracic Surgery

## 2011-08-05 ENCOUNTER — Ambulatory Visit (HOSPITAL_COMMUNITY)
Admission: RE | Admit: 2011-08-05 | Discharge: 2011-08-05 | Disposition: A | Discharge status: Home / Self Care | Payer: Medicare Other | Source: Ambulatory Visit | Attending: Thoracic Surgery | Admitting: Thoracic Surgery

## 2011-08-05 ENCOUNTER — Ambulatory Visit (HOSPITAL_COMMUNITY): Payer: Medicare Other | Admitting: Anesthesiology

## 2011-08-05 ENCOUNTER — Encounter (HOSPITAL_COMMUNITY): Payer: Self-pay | Admitting: *Deleted

## 2011-08-05 ENCOUNTER — Encounter (HOSPITAL_COMMUNITY): Payer: Self-pay | Admitting: Thoracic Surgery

## 2011-08-05 ENCOUNTER — Encounter (HOSPITAL_COMMUNITY)
Admission: RE | Disposition: A | Discharge status: Home / Self Care | Payer: Self-pay | Source: Ambulatory Visit | Attending: Thoracic Surgery

## 2011-08-05 ENCOUNTER — Encounter (HOSPITAL_COMMUNITY): Payer: Self-pay | Admitting: Anesthesiology

## 2011-08-05 DIAGNOSIS — Z01810 Encounter for preprocedural cardiovascular examination: Secondary | ICD-10-CM | POA: Insufficient documentation

## 2011-08-05 DIAGNOSIS — Z01818 Encounter for other preprocedural examination: Secondary | ICD-10-CM | POA: Insufficient documentation

## 2011-08-05 DIAGNOSIS — C3410 Malignant neoplasm of upper lobe, unspecified bronchus or lung: Secondary | ICD-10-CM | POA: Insufficient documentation

## 2011-08-05 DIAGNOSIS — R0602 Shortness of breath: Secondary | ICD-10-CM | POA: Insufficient documentation

## 2011-08-05 DIAGNOSIS — D381 Neoplasm of uncertain behavior of trachea, bronchus and lung: Secondary | ICD-10-CM

## 2011-08-05 DIAGNOSIS — R599 Enlarged lymph nodes, unspecified: Secondary | ICD-10-CM | POA: Insufficient documentation

## 2011-08-05 DIAGNOSIS — R222 Localized swelling, mass and lump, trunk: Secondary | ICD-10-CM | POA: Insufficient documentation

## 2011-08-05 LAB — AFB CULTURE WITH SMEAR (NOT AT ARMC): Acid Fast Smear: NONE SEEN

## 2011-08-05 SURGERY — BRONCHOSCOPY, WITH EBUS
Anesthesia: General | Site: Chest | Wound class: Clean Contaminated

## 2011-08-05 MED ORDER — PHENYLEPHRINE HCL 10 MG/ML IJ SOLN
INTRAMUSCULAR | Status: DC | PRN
  Administered 2011-08-05: 120 ug via INTRAVENOUS
  Administered 2011-08-05: 80 ug via INTRAVENOUS
  Administered 2011-08-05: 120 ug via INTRAVENOUS

## 2011-08-05 MED ORDER — PROMETHAZINE HCL 25 MG/ML IJ SOLN: 12.5000 mg | Freq: Four times a day (QID) | INTRAMUSCULAR | Status: DC | PRN

## 2011-08-05 MED ORDER — ONDANSETRON HCL 4 MG/2ML IJ SOLN
INTRAMUSCULAR | Status: DC | PRN
  Administered 2011-08-05: 4 mg via INTRAVENOUS

## 2011-08-05 MED ORDER — SODIUM CHLORIDE 0.9 % IJ SOLN: 3.0000 mL | INTRAMUSCULAR | Status: DC | PRN

## 2011-08-05 MED ORDER — ROCURONIUM BROMIDE 100 MG/10ML IV SOLN
INTRAVENOUS | Status: DC | PRN
  Administered 2011-08-05: 50 mg via INTRAVENOUS

## 2011-08-05 MED ORDER — FENTANYL CITRATE 0.05 MG/ML IJ SOLN
INTRAMUSCULAR | Status: DC | PRN
Start: 2011-08-05 — End: 2011-08-05
  Administered 2011-08-05 (×2): 50 ug via INTRAVENOUS

## 2011-08-05 MED ORDER — SODIUM CHLORIDE 0.9 % IJ SOLN: 3.0000 mL | Freq: Two times a day (BID) | INTRAMUSCULAR | Status: DC

## 2011-08-05 MED ORDER — OXYCODONE HCL 5 MG PO TABS: 5.0000 mg | ORAL_TABLET | ORAL | Status: DC | PRN

## 2011-08-05 MED ORDER — ONDANSETRON HCL 4 MG/2ML IJ SOLN: 4.0000 mg | Freq: Four times a day (QID) | INTRAMUSCULAR | Status: DC | PRN

## 2011-08-05 MED ORDER — THERA M PLUS PO TABS
1.0000 | ORAL_TABLET | Freq: Every day | ORAL | Status: DC
Start: 2011-08-05 — End: 2011-08-05
  Filled 2011-08-05: qty 1

## 2011-08-05 MED ORDER — ACETAMINOPHEN 650 MG RE SUPP: 650.0000 mg | RECTAL | Status: DC | PRN

## 2011-08-05 MED ORDER — GLYCOPYRROLATE 0.2 MG/ML IJ SOLN
INTRAMUSCULAR | Status: DC | PRN
  Administered 2011-08-05: .6 mg via INTRAVENOUS

## 2011-08-05 MED ORDER — PROPOFOL 10 MG/ML IV EMUL
INTRAVENOUS | Status: DC | PRN
  Administered 2011-08-05: 200 mg via INTRAVENOUS

## 2011-08-05 MED ORDER — ALBUTEROL SULFATE HFA 108 (90 BASE) MCG/ACT IN AERS
INHALATION_SPRAY | RESPIRATORY_TRACT | Status: DC | PRN
  Administered 2011-08-05 (×2): 4 via RESPIRATORY_TRACT

## 2011-08-05 MED ORDER — PHENYLEPHRINE HCL 10 MG/ML IJ SOLN
10.0000 mg | INTRAVENOUS | Status: DC | PRN
  Administered 2011-08-05: 10 ug/min via INTRAVENOUS

## 2011-08-05 MED ORDER — LACTATED RINGERS IV SOLN
INTRAVENOUS | Status: DC | PRN
  Administered 2011-08-05 (×2): via INTRAVENOUS

## 2011-08-05 MED ORDER — NYSTATIN-TRIAMCINOLONE 100000-0.1 UNIT/GM-% EX CREA
1.0000 "application " | TOPICAL_CREAM | Freq: Every day | CUTANEOUS | Status: DC | PRN
  Filled 2011-08-05: qty 15

## 2011-08-05 MED ORDER — ACETAMINOPHEN 325 MG PO TABS: 650.0000 mg | ORAL_TABLET | ORAL | Status: DC | PRN

## 2011-08-05 MED ORDER — NEOSTIGMINE METHYLSULFATE 1 MG/ML IJ SOLN
INTRAMUSCULAR | Status: DC | PRN
  Administered 2011-08-05: 5 mg via INTRAVENOUS

## 2011-08-05 MED ORDER — PROMETHAZINE HCL 25 MG/ML IJ SOLN: 6.2500 mg | INTRAMUSCULAR | Status: DC | PRN

## 2011-08-05 MED ORDER — CELECOXIB 200 MG PO CAPS
200.0000 mg | ORAL_CAPSULE | Freq: Two times a day (BID) | ORAL | Status: DC | PRN
  Filled 2011-08-05: qty 1

## 2011-08-05 MED ORDER — PROSTATE HEALTH PO CAPS: 1.0000 | ORAL_CAPSULE | Freq: Every day | ORAL | Status: DC

## 2011-08-05 MED ORDER — HYDROMORPHONE HCL PF 1 MG/ML IJ SOLN: 0.2500 mg | INTRAMUSCULAR | Status: DC | PRN

## 2011-08-05 MED ORDER — SODIUM CHLORIDE 0.9 % IV SOLN: 250.0000 mL | INTRAVENOUS | Status: DC | PRN

## 2011-08-05 MED ORDER — VARENICLINE TARTRATE 1 MG PO TABS
1.0000 mg | ORAL_TABLET | Freq: Two times a day (BID) | ORAL | Status: DC
  Filled 2011-08-05 (×2): qty 1

## 2011-08-05 MED ORDER — MIDAZOLAM HCL 5 MG/5ML IJ SOLN
INTRAMUSCULAR | Status: DC | PRN
  Administered 2011-08-05: 2 mg via INTRAVENOUS

## 2011-08-05 MED ORDER — VECURONIUM BROMIDE 10 MG IV SOLR
INTRAVENOUS | Status: DC | PRN
  Administered 2011-08-05: 2 mg via INTRAVENOUS
  Administered 2011-08-05: 1 mg via INTRAVENOUS

## 2011-08-05 MED ORDER — SIMVASTATIN 40 MG PO TABS
40.0000 mg | ORAL_TABLET | Freq: Every day | ORAL | Status: DC
  Filled 2011-08-05: qty 1

## 2011-08-05 MED ORDER — ESMOLOL HCL 10 MG/ML IV SOLN
INTRAVENOUS | Status: DC | PRN
  Administered 2011-08-05: 40 mg via INTRAVENOUS

## 2011-08-05 MED ORDER — LEVOTHYROXINE SODIUM 175 MCG PO TABS
175.0000 ug | ORAL_TABLET | Freq: Every day | ORAL | Status: DC
  Filled 2011-08-05: qty 1

## 2011-08-05 MED ORDER — SILODOSIN 8 MG PO CAPS
8.0000 mg | ORAL_CAPSULE | Freq: Every day | ORAL | Status: DC
  Filled 2011-08-05: qty 1

## 2011-08-05 SURGICAL SUPPLY — 40 items
BRUSH CYTOL CELLEBRITY 1.5X140 (MISCELLANEOUS) IMPLANT
BRUSH SUPERTRAX BIOPSY (INSTRUMENTS) IMPLANT
BRUSH SUPERTRAX NDL-TIP CYTO (INSTRUMENTS) ×1 IMPLANT
CANISTER SUCTION 2500CC (MISCELLANEOUS) ×3 IMPLANT
CHANNEL WORK EXTEND EDGE 180 (KITS) IMPLANT
CHANNEL WORK EXTEND EDGE 45 (KITS) IMPLANT
CHANNEL WORK EXTEND EDGE 90 (KITS) IMPLANT
CLOTH BEACON ORANGE TIMEOUT ST (SAFETY) ×3 IMPLANT
CONT SPEC 4OZ CLIKSEAL STRL BL (MISCELLANEOUS) ×3 IMPLANT
COVER TABLE BACK 60X90 (DRAPES) ×3 IMPLANT
FILTER STRAW FLUID ASPIR (MISCELLANEOUS) IMPLANT
FORCEPS BIOP RJ4 1.8 (CUTTING FORCEPS) IMPLANT
FORCEPS BIOP SUPERTRX PREMAR (INSTRUMENTS) ×1 IMPLANT
GLOVE SURG SIGNA 7.5 PF LTX (GLOVE) ×3 IMPLANT
KIT LOCATABLE GUIDE (CANNULA) IMPLANT
KIT MARKER FIDUCIAL DELIVERY (KITS) IMPLANT
KIT PROCEDURE EDGE 180 (KITS) IMPLANT
KIT PROCEDURE EDGE 45 (KITS) IMPLANT
KIT PROCEDURE EDGE 90 (KITS) ×1 IMPLANT
KIT ROOM TURNOVER OR (KITS) ×3 IMPLANT
MARKER SKIN DUAL TIP RULER LAB (MISCELLANEOUS) ×3 IMPLANT
NDL BIOPSY TRANSBRONCH 21G (NEEDLE) IMPLANT
NDL ECHOTIP HI DEF 22GA (NEEDLE) IMPLANT
NDL SUPERTRX PREMARK BIOPSY (NEEDLE) IMPLANT
NEEDLE BIOPSY TRANSBRONCH 21G (NEEDLE) IMPLANT
NEEDLE ECHOTIP HI DEF 22GA (NEEDLE) ×3 IMPLANT
NEEDLE SUPERTRX PREMARK BIOPSY (NEEDLE) ×3 IMPLANT
NEEDLE SYS SONOTIP II EBUSTBNA (NEEDLE) IMPLANT
NS IRRIG 1000ML POUR BTL (IV SOLUTION) ×3 IMPLANT
OIL SILICONE PENTAX (PARTS (SERVICE/REPAIRS)) ×3 IMPLANT
PAD ARMBOARD 7.5X6 YLW CONV (MISCELLANEOUS) ×6 IMPLANT
PATCHES PATIENT (LABEL) ×3 IMPLANT
SPONGE GAUZE 4X4 12PLY (GAUZE/BANDAGES/DRESSINGS) ×3 IMPLANT
SYR 20CC LL (SYRINGE) ×3 IMPLANT
SYR 20ML ECCENTRIC (SYRINGE) ×6 IMPLANT
SYR 30ML LL (SYRINGE) ×3 IMPLANT
SYR 5ML LUER SLIP (SYRINGE) ×3 IMPLANT
TOWEL OR 17X24 6PK STRL BLUE (TOWEL DISPOSABLE) ×3 IMPLANT
TRAP SPECIMEN MUCOUS 40CC (MISCELLANEOUS) ×3 IMPLANT
TUBE CONNECTING 12X1/4 (SUCTIONS) ×3 IMPLANT

## 2011-08-05 NOTE — Transfer of Care (Signed)
Immediate Anesthesia Transfer of Care Note  Patient: Joseph [name]  Procedure(s) Performed:  VIDEO BRONCHOSCOPY WITH ENDOBRONCHIAL ULTRASOUND; VIDEO BRONCHOSCOPY WITH ENDOBRONCHIAL NAVIGATION  Patient Location: PACU  Anesthesia Type: General  Level of Consciousness: awake, alert , oriented and patient cooperative  Airway & Oxygen Therapy: Patient Spontanous Breathing and Patient connected to nasal cannula oxygen  Post-op Assessment: Report given to PACU RN and Post -op Vital signs reviewed and stable  Post vital signs: Reviewed and stable  Complications: No apparent anesthesia complications

## 2011-08-05 NOTE — Anesthesia Postprocedure Evaluation (Signed)
  Anesthesia Post-op Note  Patient: Joseph [name]  Procedure(s) Performed:  VIDEO BRONCHOSCOPY WITH ENDOBRONCHIAL ULTRASOUND; VIDEO BRONCHOSCOPY WITH ENDOBRONCHIAL NAVIGATION  Patient Location: PACU  Anesthesia Type: General  Level of Consciousness: awake  Airway and Oxygen Therapy: Patient Spontanous Breathing  Post-op Pain: mild  Post-op Assessment: Post-op Vital signs reviewed  Post-op Vital Signs: stable  Complications: No apparent anesthesia complications

## 2011-08-05 NOTE — Anesthesia Procedure Notes (Signed)
Procedure Name: Intubation Date/Time: 08/05/2011 8:32 AM Performed by: Adria Dill Oxygen Delivery Method: Circle System Utilized Preoxygenation: Pre-oxygenation with 100% oxygen Intubation Type: IV induction Ventilation: Mask ventilation without difficulty Laryngoscope Size: Miller and 2 Grade View: Grade II Tube type: Oral Tube size: 8.5 mm Number of attempts: 1 Placement Confirmation: ETT inserted through vocal cords under direct vision,  breath sounds checked- equal and bilateral and positive ETCO2 Secured at: 22 cm Tube secured with: Tape Dental Injury: Teeth and Oropharynx as per pre-operative assessment

## 2011-08-05 NOTE — Interval H&P Note (Signed)
History and Physical Interval Note:  08/05/2011 7:57 AM  Joseph [name]  has presented today for surgery, with the diagnosis of lung lesion  The various methods of treatment have been discussed with the patient and family. After consideration of risks, benefits and other options for treatment, the patient has consented to  Procedure(s): VIDEO BRONCHOSCOPY WITH ENDOBRONCHIAL NAVIGATION VIDEO BRONCHOSCOPY WITH ENDOBRONCHIAL ULTRASOUND as a surgical intervention .  The patients' history has been reviewed, patient examined, no change in status, stable for surgery.  I have reviewed the patients' chart and labs.  Questions were answered to the patient's satisfaction.     [name]

## 2011-08-05 NOTE — Anesthesia Preprocedure Evaluation (Addendum)
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 Followed with primary care physician.   Neuro/Psych Negative Neurological ROS  Negative Psych ROS   GI/Hepatic GERD-  ,(+)     substance abuse  alcohol use,   Endo/Other  Hypothyroidism   Renal/GU negative Renal ROS     Musculoskeletal  (+) Arthritis -, Osteoarthritis,    Abdominal (+) obese,   Peds  Hematology negative hematology ROS (+)   Anesthesia Other Findings hoarse  Reproductive/Obstetrics negative OB ROS                        Anesthesia Physical Anesthesia Plan  ASA: III  Anesthesia Plan: General   Post-op Pain Management:    Induction: Intravenous  Airway Management Planned: Oral ETT  Additional Equipment:   Intra-op Plan:   Post-operative Plan: Extubation in OR  Informed Consent: I have reviewed the patients History and Physical, chart, labs and discussed the procedure including the risks, benefits and alternatives for the proposed anesthesia with the patient or authorized representative who has indicated his/her understanding and acceptance.   Dental advisory given  Plan Discussed with: CRNA and Surgeon  Anesthesia Plan Comments:        Anesthesia Quick Evaluation

## 2011-08-05 NOTE — Op Note (Signed)
NAME:  Joseph [name], Joseph [name]          ACCOUNT NO.:  [id number]  MEDICAL RECORD NO.:  [id number]  LOCATION:  [facility]                         FACILITY:  [facility]  PHYSICIAN:  Ines Bloomer, M.D. DATE OF BIRTH:  14-Aug-1941  DATE OF PROCEDURE: DATE OF DISCHARGE:                              OPERATIVE REPORT   PREOPERATIVE DIAGNOSIS:  Right upper lobe mass.  POSTOPERATIVE DIAGNOSIS:  Right upper lobe mass.  OPERATION PERFORMED:  Fiber bronchoscopy with endobronchial ultrasound, electromagnetic navigation.  SURGEON:  Ines Bloomer, MD  ANESTHESIA:  General.  DESCRIPTION OF PROCEDURE:  After percutaneous insertion of monitoring lines, underwent general anesthesia, video bronchoscope was passed through the endotracheal tube.  The carina was in the midline.  The right upper lobe, right middle lobe, and right lower lobe orifices were normal.  Left upper lobe and left lower lobe orifices were normal.  No endobronchial lesion was seen.  We then inserted the endobronchial ultrasound, navigated to a 4R node, which was enlarged and did 4 aspirations of this by passing the sheath up the working channel, and then under direct vision and ultrasound guidance passing the needle into the 4R node, and then put in the needle back into the sheath and removing the sheath.  We then removed the endobronchial ultrasound scope and inserted the videoscope with the extended working channel on the locatable guide and did automatic registration.  After this was done, we then navigated to approximately 1-1.5 cm of the right upper lobe lesion and through that, we removed the locatable guide and then passed a needle brush for 2 passes, and then did an aspiration for 1 pass.  Next, we then did multiple biopsies.  This was all done through the working channel.  After that was done, we did 2 more needle passes, and then finally a BAL.  After that, the extended working channel was removed, and the video bronchoscope  was removed.  The patient tolerated the procedure well.     Ines Bloomer, M.D.     DPB/MEDQ  D:  08/05/2011  T:  08/05/2011  Job:  [id number]

## 2011-08-05 NOTE — Preoperative (Signed)
Beta Blockers   Reason not to administer Beta Blockers:Not Applicable 

## 2011-08-05 NOTE — Brief Op Note (Signed)
08/05/2011  9:36 AM  PATIENT:  Joseph [name]  70 y.o. male  PRE-OPERATIVE DIAGNOSIS:  lung lesion  POST-OPERATIVE DIAGNOSIS:  rul mass PROCEDURE:  Procedure(s): VIDEO BRONCHOSCOPY WITH ENDOBRONCHIAL ULTRASOUND VIDEO BRONCHOSCOPY WITH ENDOBRONCHIAL NAVIGATION  SURGEON:  Surgeon(s): D Karle Plumber, MD  PHYSICIAN ASSISTANT:   ASSISTANTS: none   ANESTHESIA:   general  EBL:  Total I/O In: 1000 [I.V.:1000] Out: -   BLOOD ADMINISTERED:none  DRAINS: none   LOCAL MEDICATIONS USED:  NONE  SPECIMEN:  Aspirate  DISPOSITION OF SPECIMEN:  PATHOLOGY  COUNTS:  YES  TOURNIQUET:  * No tourniquets in log *  DICTATION: .Other Dictation: Dictation Number [id number]  PLAN OF CARE: Discharge to home after PACU  PATIENT DISPOSITION:  PACU - hemodynamically stable.   Delay start of Pharmacological VTE agent (>24hrs) due to surgical blood loss or risk of bleeding:  yes

## 2011-08-05 NOTE — H&P (View-Only) (Signed)
PCP is No primary provider on file. Referring Provider is [name]., MD  Chief Complaint  Patient presents with  . Lung Mass    Referral from Dr Doristine Counter for surgical eval on RUL Mass, liver lesion, Chest CT 07/17/11, PET Scan sch'ed for 08/07/11    HPI: This 70 year old Caucasian male was found to have a right upper lobe mass. CT scan showed a 2-1/2 cm right upper lobe mass with the mediastinal adenopathy a PET scan is pending. He quit smoking about a month ago because of increasing shortness of breath. He's had no hemoptysis fever chills or excessive sputum he is. He is referred here for evaluation. The plan to MRI of the brain, PET scan, and pulmonary function tests. We will schedule her for bronchoscopy with endobronchial ultrasound and electromagnetic navigation on December 5 the chances assessment of the procedure were discussed. The risk of the procedure were discussed.   No past medical history on file.  No past surgical history on file.  No family history on file.  Social History History  Substance Use Topics  . Smoking status: Former Smoker    Types: Cigarettes    Quit date: 07/02/2011  . Smokeless tobacco: Former Neurosurgeon  . Alcohol Use: Yes     soical    Current Outpatient Prescriptions  Medication Sig Dispense Refill  . CHANTIX 1 MG tablet 1 mg 2 (two) times daily.       Marland Kitchen nystatin-triamcinolone (MYCOLOG II) cream       . pravastatin (PRAVACHOL) 40 MG tablet Take 40 mg by mouth daily.       Marland Kitchen RAPAFLO 8 MG CAPS capsule Take 8 mg by mouth daily with breakfast.       . SYNTHROID 175 MCG tablet Take 175 mcg by mouth daily.         No Known Allergies  Review of Systems  Constitutional: Negative.   Eyes: Negative.   Respiratory: Positive for shortness of breath and wheezing.   Cardiovascular: Negative.   Gastrointestinal: Negative.   Genitourinary: Positive for frequency.  Musculoskeletal: Positive for arthralgias.  Neurological: Negative.   Hematological:  Negative.   Psychiatric/Behavioral: Negative.     BP 178/107  Pulse 104  Resp 20  Ht 6' (1.829 m)  Wt 208 lb (94.348 kg)  BMI 28.21 kg/m2  SpO2 93% Physical Exam  Constitutional: He is oriented to person, place, and time. He appears well-developed and well-nourished.  HENT:  Head: Normocephalic and atraumatic.  Right Ear: External ear normal.  Left Ear: External ear normal.  Mouth/Throat: Oropharynx is clear and moist.  Eyes: Conjunctivae and EOM are normal. Pupils are equal, round, and reactive to light.  Neck: Normal range of motion. Neck supple. Thyromegaly present.  Cardiovascular: Normal rate, regular rhythm, normal heart sounds and intact distal pulses.   Pulmonary/Chest: Effort normal. He has wheezes.  Abdominal: Soft. Bowel sounds are normal.  Musculoskeletal: Normal range of motion.  Neurological: He is alert and oriented to person, place, and time. He has normal reflexes.  Skin: Skin is warm and dry.  Psychiatric: He has a normal mood and affect. His behavior is normal. Judgment and thought content normal.     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

## 2011-08-06 ENCOUNTER — Ambulatory Visit (HOSPITAL_BASED_OUTPATIENT_CLINIC_OR_DEPARTMENT_OTHER): Payer: Medicare Other | Admitting: Internal Medicine

## 2011-08-06 ENCOUNTER — Encounter: Payer: Medicare Other | Admitting: Internal Medicine

## 2011-08-06 ENCOUNTER — Ambulatory Visit
Admission: RE | Admit: 2011-08-06 | Discharge: 2011-08-06 | Disposition: A | Discharge status: Home / Self Care | Payer: Medicare Other | Source: Ambulatory Visit | Attending: Radiation Oncology | Admitting: Radiation Oncology

## 2011-08-06 ENCOUNTER — Institutional Professional Consult (permissible substitution) (INDEPENDENT_AMBULATORY_CARE_PROVIDER_SITE_OTHER): Payer: Medicare Other | Admitting: Thoracic Surgery

## 2011-08-06 ENCOUNTER — Other Ambulatory Visit: Payer: Self-pay

## 2011-08-06 VITALS — BP 156/105 | HR 110 | Temp 97.3°F | Resp 20 | Ht 72.0 in | Wt 208.0 lb

## 2011-08-06 DIAGNOSIS — J438 Other emphysema: Secondary | ICD-10-CM | POA: Insufficient documentation

## 2011-08-06 DIAGNOSIS — Y842 Radiological procedure and radiotherapy as the cause of abnormal reaction of the patient, or of later complication, without mention of misadventure at the time of the procedure: Secondary | ICD-10-CM | POA: Insufficient documentation

## 2011-08-06 DIAGNOSIS — C3490 Malignant neoplasm of unspecified part of unspecified bronchus or lung: Secondary | ICD-10-CM

## 2011-08-06 DIAGNOSIS — R059 Cough, unspecified: Secondary | ICD-10-CM

## 2011-08-06 DIAGNOSIS — E785 Hyperlipidemia, unspecified: Secondary | ICD-10-CM | POA: Insufficient documentation

## 2011-08-06 DIAGNOSIS — Z87891 Personal history of nicotine dependence: Secondary | ICD-10-CM | POA: Insufficient documentation

## 2011-08-06 DIAGNOSIS — K208 Other esophagitis without bleeding: Secondary | ICD-10-CM | POA: Insufficient documentation

## 2011-08-06 DIAGNOSIS — Z801 Family history of malignant neoplasm of trachea, bronchus and lung: Secondary | ICD-10-CM | POA: Insufficient documentation

## 2011-08-06 DIAGNOSIS — C771 Secondary and unspecified malignant neoplasm of intrathoracic lymph nodes: Secondary | ICD-10-CM | POA: Insufficient documentation

## 2011-08-06 DIAGNOSIS — R0989 Other specified symptoms and signs involving the circulatory and respiratory systems: Secondary | ICD-10-CM

## 2011-08-06 DIAGNOSIS — E039 Hypothyroidism, unspecified: Secondary | ICD-10-CM | POA: Insufficient documentation

## 2011-08-06 DIAGNOSIS — R0609 Other forms of dyspnea: Secondary | ICD-10-CM

## 2011-08-06 DIAGNOSIS — Z510 Encounter for antineoplastic radiation therapy: Secondary | ICD-10-CM | POA: Insufficient documentation

## 2011-08-06 DIAGNOSIS — C3410 Malignant neoplasm of upper lobe, unspecified bronchus or lung: Secondary | ICD-10-CM

## 2011-08-06 DIAGNOSIS — Z79899 Other long term (current) drug therapy: Secondary | ICD-10-CM | POA: Insufficient documentation

## 2011-08-06 DIAGNOSIS — C3491 Malignant neoplasm of unspecified part of right bronchus or lung: Secondary | ICD-10-CM | POA: Insufficient documentation

## 2011-08-06 DIAGNOSIS — R05 Cough: Secondary | ICD-10-CM

## 2011-08-06 NOTE — Progress Notes (Signed)
HPI patient returns for followup. PET scan was positive for mediastinal adenopathy and a possiblei a contralateral mediastinal node. The right upper lobe lesion was also positive. Pulmonary function test showed severe obstructive pulmonary disease with a decreased DLCO. Brain scan was negative for metastasis. 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 We will refer him to Dr. early or Dr. fields for evaluation. He has a clinically stage IIIa non-small cell lung cancer. His biopsies showed non-small cell lung cancer. He is probably adenocarcinoma but will await final report. He was seen today by Dr. Moody as well as Dr. Mohammad. They will initiate treatment.  Current Outpatient Prescriptions  Medication Sig Dispense Refill  . celecoxib (CELEBREX) 200 MG capsule Take 200 mg by mouth 2 (two) times daily as needed.       . CHANTIX 1 MG tablet 1 mg 2 (two) times daily.       . Misc Natural Products (PROSTATE HEALTH) CAPS Take 1 capsule by mouth daily.        . Multiple Vitamins-Minerals (MULTIVITAMINS THER. W/MINERALS) TABS Take 1 tablet by mouth daily.        . nystatin-triamcinolone (MYCOLOG II) cream Apply 1 application topically daily as needed.       . RAPAFLO 8 MG CAPS capsule Take 8 mg by mouth daily with breakfast.       . pravastatin (PRAVACHOL) 40 MG tablet Take 40 mg by mouth daily.       . SYNTHROID 175 MCG tablet Take 175 mcg by mouth daily.          Review of Systems: Unchanged   Physical Exam lungs are clear to auscultation percussion distant breath are distant Diagnostic Tests: Bronchoscopy biopsies revealed non-small cell lung cancer   Impression: Stage IIIa non-small cell lung cancer   Plan: Refer for radiation and chemotherapy      

## 2011-08-07 ENCOUNTER — Telehealth: Payer: Self-pay | Admitting: Internal Medicine

## 2011-08-07 ENCOUNTER — Other Ambulatory Visit: Payer: Medicare Other

## 2011-08-07 ENCOUNTER — Other Ambulatory Visit (HOSPITAL_COMMUNITY): Payer: Medicare Other

## 2011-08-07 ENCOUNTER — Encounter: Payer: Self-pay | Admitting: *Deleted

## 2011-08-07 ENCOUNTER — Telehealth: Payer: Self-pay | Admitting: *Deleted

## 2011-08-07 LAB — CULTURE, RESPIRATORY W GRAM STAIN

## 2011-08-07 NOTE — Progress Notes (Signed)
Spoke with pt at [facility] 08/06/11.  Pt educational/resource information given

## 2011-08-07 NOTE — Telephone Encounter (Signed)
Called pt , left message, chemo class 12/10 , asked pt to get new calendar for month of December

## 2011-08-07 NOTE — Telephone Encounter (Signed)
NOTIFIED DR.MOHAMED'S NURSE, DIANE BELL,RN. ALSO CALLED AND LEFT A VOICE MAIL FOR SHIRLEY HALSEY IN RADIATION TO RETURN A CALL CONCERNING THIS PT.

## 2011-08-07 NOTE — Progress Notes (Signed)
REASON FOR CONSULTATION:  70 years old white male diagnosed with lung cancer.  HPI Joseph [name] is a 70 y.o. male was past medical history significant for her and dyslipidemia as well as abdominal aortic aneurysm and long history of smoking. The patient mentions that he has been complaining of increasing shortness breath and fatigue since in June of 2011. He was seen a few times by his primary care physician and received symptomatic management. Chest x-ray for a preoperative evaluation for left knee surgery was performed on 07/17/2011 and it showed abnormalities in the right upper lobe of the lung. This was followed by CT scan of the chest with contrast on 07/20/2011 which showed Right upper lobe mass with mediastinal adenopathy extending contralaterally to the AP window. Findings are most consistent with primary bronchogenic carcinoma. If this is a non-small cell lung cancer, it is most consistent with at least T2aN3M0 or stage IIIB disease. This was followed by a PET scan on 08/03/2011 and it showed An irregular mass in the right upper lobe measures 3.2 x 2.7 cm, with an S U V max of 25.6. And its lateral prevascular lymph node measures 1.8 cm in short axis, with an S U V max of 14.4. A low right paratracheal lymph node measures 2 cm in short axis, with an S U V max of 23.9. There is faint uptake in the left hilum, with an S U V max of 3.9. On diagnostic CT examination performed in November, there is an unenlarged lymph node in the left hilum. A 12 mm AP window lymph node does not show  hypermetabolism. A 10 mm nodule along the minor fissure does not show hypermetabolism. No additional areas of abnormal hypermetabolism in the neck, chest, abdomen or pelvis. There is an infrarenal aortic aneurysm, measuring 5.1 x 5.0 cm. Left common iliac artery measures 4.2 cm and right common iliac  artery measures 2.8 cm. MRI of the brain with and without contrast on 08/04/2011 was negative for metastatic disease.  The patient was seen by Dr. Edwyna Shell and on 08/01/2011 he underwent Fiber bronchoscopy with endobronchial ultrasound, electromagnetic navigation. The cytology of the biopsy showed malignant cells consistent with non-small cell carcinoma but the immunohistochemical stains to identify the subtype are still pending. Dr. Edwyna Shell kindly referred the patient to me today for evaluation and recommendation regarding his treatment. The patient was seen at the [facility]). He is feeling fine except for shortness breath with exertion as well as occasional cough he had fewer blood tinged sputum after the bronchoscopy. The patient denied having any significant weight loss, denied having any visual changes, no nausea or vomiting and no change in his bowel movement. He was accompanied by his wife [name] today.   Past Medical History  Diagnosis Date  . History of colon polyps   . 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 Hyperlipidemia     takes Pravastatin  . Emphysema   . Lung mass     right upper lobe  . Shortness of breath     with exertion  . Cancer     lung  . Loose stools   . Enlarged prostate     takes Rapaflo daily  . Nocturia   . Arthritis     left knee and back arthrtis  . Hypothyroidism     takes Synthroid daily  . Cataract     Past Surgical History  Procedure Date  . Hernia repair 70's  . Vasectomy  70's  . Finger surgery 2008    left pointer finger  . Knee arthroscopy 2008    left knee  . Tonsillectomy     as a child  . Colonoscopy 2008  . Cardiac catheterization 2003  . Esophagogastroduodenoscopy     Family History  Problem Relation Age of Onset  . Anesthesia problems Neg Hx   . Hypotension Neg Hx   . Malignant hyperthermia Neg Hx   . Pseudochol deficiency Neg Hx     Social History History  Substance Use Topics  . Smoking status: Former Smoker -- 1.5 packs/day for 40 years    Types: Cigarettes    Quit date: 07/02/2011  . Smokeless tobacco:  Former Neurosurgeon  . Alcohol Use: Yes     soical    No Known Allergies  Current Outpatient Prescriptions  Medication Sig Dispense Refill  . celecoxib (CELEBREX) 200 MG capsule Take 200 mg by mouth 2 (two) times daily as needed.       . CHANTIX 1 MG tablet 1 mg 2 (two) times daily.       . Misc Natural Products (PROSTATE HEALTH) CAPS Take 1 capsule by mouth daily.        . Multiple Vitamins-Minerals (MULTIVITAMINS THER. W/MINERALS) TABS Take 1 tablet by mouth daily.        Marland Kitchen nystatin-triamcinolone (MYCOLOG II) cream Apply 1 application topically daily as needed.       . pravastatin (PRAVACHOL) 40 MG tablet Take 40 mg by mouth daily.       Marland Kitchen RAPAFLO 8 MG CAPS capsule Take 8 mg by mouth daily with breakfast.       . SYNTHROID 175 MCG tablet Take 175 mcg by mouth daily.         Review of Systems  A comprehensive review of systems was negative except for: Respiratory: positive for cough and dyspnea on exertion  Physical Exam  ZOX:WRUEA, healthy, no distress, well nourished and well developed SKIN: skin color, texture, turgor are normal HEAD: Normocephalic EYES: normal EARS: External ears normal OROPHARYNX:no exudate, no erythema and lips, buccal mucosa, and tongue normal  NECK: supple, no adenopathy LYMPH:  no palpable lymphadenopathy LUNGS: clear to auscultation  HEART: regular rate & rhythm, no murmurs and no gallops ABDOMEN:abdomen soft, non-tender and normal bowel sounds BACK: Back symmetric, no curvature. EXTREMITIES:no joint deformities, effusion, or inflammation, no edema, no skin discoloration  NEURO: alert & oriented x 3 with fluent speech, no focal motor/sensory deficits, gait normal    Studies/Results: Dg Chest 2 View Within Previous 72 Hours.  Films Obtained On Friday Are Acceptable For Monday And Tuesday Cases  08/04/2011  *RADIOLOGY REPORT*  Clinical Data: Preop bronchoscopy  CHEST - 2 VIEW  Comparison: 08/02/2010  Findings: Heart size appears normal.  No pleural  effusion or pulmonary edema identified.  There is a mass within the right upper lobe which measures 3.30 x 4.6 cm.  No acute airspace consolidation.  IMPRESSION:  1.  Right upper lobe lung mass. 2.  Emphysema.  Original Report Authenticated By: Rosealee Albee, M.D.   Ct Chest W Contrast  07/20/2011  *RADIOLOGY REPORT*  Clinical Data: Right upper lobe mass on chest radiograph.  Ex- smoker.  Shortness of breath.  CT CHEST WITH CONTRAST  Technique:  Multidetector CT imaging of the chest was performed following the standard protocol during bolus administration of intravenous contrast.  Contrast: 75mL OMNIPAQUE IOHEXOL 300 MG/ML IV SOLN  BUN and creatinine were obtained on site at  [facility] at [address] Results:  BUN 9.0 mg/dL,  Creatinine 0.6 mg/dL.  Comparison: Chest radiograph 07/17/2011.  Findings: Prevascular lymph nodes measure up to 1.3 x 2.2 cm (image 27).  Lymph node anterior to the right mainstem bronchus measures 1.7 x 2.5 cm (image 27). AP window lymph nodes measure up to 1.2 cm in short axis.  No hilar or axillary adenopathy.  Coronary artery calcification.  Heart size normal.  No pericardial effusion.  Pre pericardiac lymph node measures 7 mm and has a fatty hilum.  There may be a 9 mm lymph node adjacent to the inferior vena cava (image 55).  Severe emphysema with bullous changes at the right apex.  A spiculated mass in the the right upper lobe measures 3.5 x 2.5 cm. A 10 mm nodule is seen along the minor fissure (image 36). Dependent atelectasis in both lower lobes.  Mild scarring in the right middle lobe and lingula.  No pleural fluid.  Airway is unremarkable.  Incidental imaging of the upper abdomen shows a low attenuation lesion along the peripheral right hepatic lobe, measuring 1.3 x 2.5 cm (image 52). Liver margin may be slightly irregular. Gastrohepatic ligament lymph nodes measure 10 mm.  No worrisome lytic or sclerotic lesions.  IMPRESSION:  1.  Right upper lobe mass with  mediastinal adenopathy extending contralaterally to the AP window.  Findings are most consistent with primary bronchogenic carcinoma.  If this is a non-small cell lung cancer, it is most consistent with at least T2aN3M0 or stage IIIB disease. 2.  Low attenuation lesion along the margin of the right hepatic lobe is indeterminate.  MR abdomen with and without contrast could be performed in further evaluation, as clinically indicated. 3.  Liver margin appears slightly irregular, suggesting cirrhosis.  Original Report Authenticated By: Reyes Ivan, M.D.   Mr Laqueta Jean UX Contrast  08/05/2011  *RADIOLOGY REPORT*  Clinical Data:   Dizziness.  History of right upper lobe  mass. Evaluate for metastatic disease.  MRI HEAD WITHOUT AND WITH CONTRAST  Technique:  Multiplanar, multiecho pulse sequences of the brain and surrounding structures were obtained according to standard protocol without and with intravenous contrast  Contrast: 10mL MULTIHANCE GADOBENATE DIMEGLUMINE 529 MG/ML IV SOLN  Comparison: None.  Findings:  Gray-white matter differentiation is normal. The sella appears normal.  There is mild heterogeneous signal of the inferior aspect of the clivus.  Cerebellar tonsils are above the level of the foramen magnum.  The odontoid process, the predental space and the prevertebral soft tissues are within normal limits.  The subcutaneous tissues of the scalp demonstrate focal nonenhancing hypointensity areas over the parietal occipital region, of unknown significance.  No diffusion weighted abnormalities seen.  Axial FLAIR and T2-weighted images demonstrate prominence of sulci overlying cerebral convexities with overlying prominence of the subarachnoid space left greater right.  A few scattered foci of signal hyperintensity are seen in the subcortical white matter bifrontally, the centrum semi ovale, the biparietal subcortical white matter and the right paramedian pons . There is  asymmetry of the right paramedian  pons anteriorly.  The ventricles are normal.  Mastoids are clear.  The internal auditory canals are symmetrical in signal and morphology.  Flow voids are maintained in the major vessels at the cranial skull base with tortuosity of the basilar artery in the prepontine cistern in contact with the right paramedian pons.  Moderate inflammatory thickening of the mucosa in   the ethmoid air cells, the maxillary sinuses  and the frontal sinuses is  seen. Visualized orbital contents are grossly normal.  No abnormal blood breakdown products seen on SPGR sequences.  No pathological intracranial enhancement seen. Dural venous sinuses are grossly patent.  IMPRESSION: 1.  No evidence of acute ischemia, or of  pathological intracranial enhancement.  2.  Nonspecific subcortical white matter changes supra and infratentorially.  These probably represent areas of gliosis related to chronic microvascular ischemia.  3.  Ectasia of the basilar artery and mass effect on the right paramedian pons anteriorly 4. Diffuse atrophy. 5.  Inflammatory reaction of the mucosa in the paranasal sinuses.  Original Report Authenticated By: Oneal Grout, M.D.   Nm Pet Image Initial (pi) Skull Base To Thigh  08/03/2011  *RADIOLOGY REPORT*  Clinical Data:  Initial treatment strategy for right upper lobe mass.  NUCLEAR MEDICINE PET CT INITIAL (PI) SKULL BASE TO THIGH  Technique:  19.6 mCi F-18 FDG was injected intravenously via the right antecubital fossa.  Full-ring PET imaging was performed from the skull base through the mid-thighs 63  minutes after injection. CT data was obtained and used for attenuation correction and anatomic localization only.  (This was not acquired as a diagnostic CT examination.)  Fasting Blood Glucose:  98  Patient Weight:  208 pounds.  Comparison:  CT chest 07/20/2011.  Findings: An irregular mass in the right upper lobe measures 3.2 x 2.7 cm, with an S U V max of 25.6.  And its lateral prevascular lymph node  measures 1.8 cm in short axis, with an S U V max of 14.4.  A low right paratracheal lymph node measures 2 cm in short axis, with an S U V max of 23.9.  There is faint uptake in the left hilum, with an S U V max of 3.9.  On diagnostic CT examination performed in November, there is an unenlarged lymph node in the left hilum.  A 12 mm AP window lymph node does not show hypermetabolism. A 10 mm nodule along the minor fissure does not show hypermetabolism.  No additional areas of abnormal hypermetabolism in the neck, chest, abdomen or pelvis.  CT images were performed for attenuation correction.  No acute findings in the neck.  Coronary artery calcification.  No pericardial or pleural effusion.  Liver is somewhat irregular along its margin.  Small stones and/or renal vascular calcifications are seen in the kidneys.  There is an infrarenal aortic aneurysm, measuring 5.1 x 5.0 cm. Left common iliac artery measures 4.2 cm and right common iliac artery measures 2.8 cm.  Prostate indents the bladder base.  Small right inguinal hernia contains fat.  IMPRESSION:  1.  Right upper lobe mass with ipsilateral mediastinal adenopathy, most consistent with primary bronchogenic carcinoma.  If this is a non-small cell lung cancer, it is most consistent with T2aN2M0 or stage IIIA disease. Note is made that this is down staged from examination performed 07/20/2011. 2.  Very mild hypermetabolism in an unenlarged left hilar lymph node.  Given the intense hypermetabolism of right mediastinal adenopathy, metastatic involvement of the left hilum would be expected to yield a much higher SUV max as well. 3.  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 4.  Coronary artery calcification. 5.  Cirrhosis.  Original Report Authenticated By: Reyes Ivan, M.D.   Dg Chest Port 1 View  08/05/2011  *RADIOLOGY REPORT*  Clinical Data: Right bronchoscopy.  PORTABLE CHEST - 1 VIEW  Comparison:  08/04/2011  Findings:  Stable right upper lobe lung mass.  No pneumothorax following bronchoscopy.  Underlying COPD changes.  Bibasilar atelectasis or infiltrates, increased since prior study, possibly related to bronchoscopy.  Heart is normal size.  IMPRESSION: No pneumothorax following bronchoscopy.  COPD.  Increasing bibasilar opacities.  Original Report Authenticated By: Cyndie Chime, M.D.   Dg C-arm 1-60 Min-no Report  08/05/2011  CLINICAL DATA: needed for case Brochial Navaigation   C-ARM 1-60 MINUTES  Fluoroscopy was utilized by the requesting physician.  No radiographic  interpretation.       Assessment: This is a very pleasant 70 years old white male recently diagnosed with a stage IIIA/IIIB non-small cell lung cancer, subtype still unknown at this point. I have a lengthy discussion with the patient and his wife today about his disease, stage, prognosis and treatment options.   Plan:  I recommended for the patient a course of concurrent chemoradiation with weekly carboplatin for AUC of 2 and paclitaxel 45 mg/M2 giving with radiation for a total of 6-7 weeks. I discussed with the patient adverse effect of this treatment including but not limited to alopecia, myelosuppression, nausea and vomiting, liver or renal dysfunction and peripheral neuropathy. The patient agreed to the current plan. I will arrange for him to have a chemotherapy education class next week. I referred the patient to Dr. Edwyna Shell for consideration of Port-A-Cath placement. The patient would also be seen by Dr. Mitzi Hansen later today for evaluation and discussion of the radiotherapy option. The patient was given prescription for Compazine 10 mg by mouth every 6 hours as needed for nausea in addition to Emla cream. I expect addition to start the first cycle of this treatment on 08/17/2011 and the patient would come back for followup visit in 3 weeks for evaluation and management any adverse effect of his chemotherapy.  All questions  were answered. The patient knows to call the clinic with any problems, questions or concerns. We can certainly see the patient much sooner if necessary.  Thank you so much for allowing me to participate in the care of Joseph [name]. I will continue to follow up the patient with you and assist in his care.  I spent 30 minutes counseling the patient face to face. The total time spent in the appointment was 60 minutes.   Samier Jaco K. 08/07/2011, 10:26 AM

## 2011-08-09 MED ORDER — CEFUROXIME SODIUM 1.5 G IJ SOLR
1.5000 g | INTRAMUSCULAR | Status: AC
  Administered 2011-08-10: 1.5 g via INTRAVENOUS
  Filled 2011-08-09: qty 1.5

## 2011-08-10 ENCOUNTER — Encounter (HOSPITAL_COMMUNITY): Payer: Self-pay | Admitting: *Deleted

## 2011-08-10 ENCOUNTER — Other Ambulatory Visit: Payer: Medicare Other

## 2011-08-10 ENCOUNTER — Encounter (HOSPITAL_COMMUNITY): Payer: Self-pay | Admitting: Certified Registered"

## 2011-08-10 ENCOUNTER — Telehealth: Payer: Self-pay | Admitting: Internal Medicine

## 2011-08-10 ENCOUNTER — Ambulatory Visit (HOSPITAL_COMMUNITY): Payer: Medicare Other

## 2011-08-10 ENCOUNTER — Encounter (HOSPITAL_COMMUNITY): Payer: Self-pay | Admitting: Anesthesiology

## 2011-08-10 ENCOUNTER — Other Ambulatory Visit: Payer: Self-pay | Admitting: *Deleted

## 2011-08-10 ENCOUNTER — Encounter (HOSPITAL_COMMUNITY)
Admission: RE | Disposition: A | Discharge status: Home / Self Care | Payer: Self-pay | Source: Ambulatory Visit | Attending: Thoracic Surgery

## 2011-08-10 ENCOUNTER — Ambulatory Visit (HOSPITAL_COMMUNITY)
Admission: RE | Admit: 2011-08-10 | Discharge: 2011-08-10 | Disposition: A | Discharge status: Home / Self Care | Payer: Medicare Other | Source: Ambulatory Visit | Attending: Thoracic Surgery | Admitting: Thoracic Surgery

## 2011-08-10 ENCOUNTER — Ambulatory Visit (HOSPITAL_COMMUNITY): Payer: Medicare Other | Admitting: Anesthesiology

## 2011-08-10 DIAGNOSIS — C3490 Malignant neoplasm of unspecified part of unspecified bronchus or lung: Secondary | ICD-10-CM | POA: Insufficient documentation

## 2011-08-10 HISTORY — PX: PORTACATH PLACEMENT: SHX2246

## 2011-08-10 SURGERY — INSERTION, TUNNELED CENTRAL VENOUS DEVICE, WITH PORT
Anesthesia: Monitor Anesthesia Care | Site: Chest | Laterality: Left | Wound class: Clean

## 2011-08-10 MED ORDER — PROMETHAZINE HCL 25 MG/ML IJ SOLN: 12.5000 mg | Freq: Four times a day (QID) | INTRAMUSCULAR | Status: DC | PRN

## 2011-08-10 MED ORDER — LIDOCAINE-EPINEPHRINE (PF) 1 %-1:200000 IJ SOLN
INTRAMUSCULAR | Status: DC | PRN
  Administered 2011-08-10: 16 mL

## 2011-08-10 MED ORDER — LACTATED RINGERS IV SOLN
INTRAVENOUS | Status: DC | PRN
  Administered 2011-08-10: 12:00:00 via INTRAVENOUS

## 2011-08-10 MED ORDER — HYDROMORPHONE HCL PF 1 MG/ML IJ SOLN: 0.2500 mg | INTRAMUSCULAR | Status: DC | PRN

## 2011-08-10 MED ORDER — SODIUM CHLORIDE 0.9 % IR SOLN
Status: DC | PRN
  Administered 2011-08-10: 12:00:00

## 2011-08-10 MED ORDER — FENTANYL CITRATE 0.05 MG/ML IJ SOLN: 50.0000 ug | INTRAMUSCULAR | Status: DC | PRN

## 2011-08-10 MED ORDER — MIDAZOLAM HCL 2 MG/2ML IJ SOLN: 1.0000 mg | INTRAMUSCULAR | Status: DC | PRN

## 2011-08-10 MED ORDER — LACTATED RINGERS IV SOLN
INTRAVENOUS | Status: DC
  Administered 2011-08-10: 12:00:00 via INTRAVENOUS

## 2011-08-10 MED ORDER — PROPOFOL 10 MG/ML IV EMUL
INTRAVENOUS | Status: DC | PRN
  Administered 2011-08-10: 100 ug/kg/min via INTRAVENOUS

## 2011-08-10 MED ORDER — ONDANSETRON HCL 4 MG/2ML IJ SOLN
INTRAMUSCULAR | Status: DC | PRN
  Administered 2011-08-10: 4 mg via INTRAVENOUS

## 2011-08-10 MED ORDER — FENTANYL CITRATE 0.05 MG/ML IJ SOLN
INTRAMUSCULAR | Status: DC | PRN
  Administered 2011-08-10: 50 ug via INTRAVENOUS
  Administered 2011-08-10: 25 ug via INTRAVENOUS

## 2011-08-10 MED ORDER — ONDANSETRON HCL 4 MG/2ML IJ SOLN: 4.0000 mg | Freq: Four times a day (QID) | INTRAMUSCULAR | Status: DC | PRN

## 2011-08-10 MED ORDER — PROMETHAZINE HCL 25 MG/ML IJ SOLN: 6.2500 mg | INTRAMUSCULAR | Status: DC | PRN

## 2011-08-10 MED ORDER — SODIUM CHLORIDE 0.9 % IR SOLN
Status: DC | PRN
  Administered 2011-08-10: 1000 mL

## 2011-08-10 MED ORDER — LORAZEPAM 2 MG/ML IJ SOLN: 1.0000 mg | Freq: Once | INTRAMUSCULAR | Status: DC | PRN

## 2011-08-10 MED ORDER — MIDAZOLAM HCL 5 MG/5ML IJ SOLN
INTRAMUSCULAR | Status: DC | PRN
  Administered 2011-08-10: 1 mg via INTRAVENOUS

## 2011-08-10 SURGICAL SUPPLY — 45 items
ADH SKN CLS APL DERMABOND .7 (GAUZE/BANDAGES/DRESSINGS) ×1
BAG DECANTER FOR FLEXI CONT (MISCELLANEOUS) ×2 IMPLANT
BLADE SURG 11 STRL SS (BLADE) ×2 IMPLANT
CANISTER SUCTION 2500CC (MISCELLANEOUS) ×2 IMPLANT
CLOTH BEACON ORANGE TIMEOUT ST (SAFETY) ×2 IMPLANT
COVER SURGICAL LIGHT HANDLE (MISCELLANEOUS) ×4 IMPLANT
DERMABOND ADVANCED (GAUZE/BANDAGES/DRESSINGS) ×1
DERMABOND ADVANCED .7 DNX12 (GAUZE/BANDAGES/DRESSINGS) ×1 IMPLANT
DRAPE C-ARM 42X72 X-RAY (DRAPES) ×2 IMPLANT
DRAPE CHEST BREAST 15X10 FENES (DRAPES) ×2 IMPLANT
ELECT REM PT RETURN 9FT ADLT (ELECTROSURGICAL) ×2
ELECTRODE REM PT RTRN 9FT ADLT (ELECTROSURGICAL) ×1 IMPLANT
GAUZE SPONGE 4X4 12PLY STRL LF (GAUZE/BANDAGES/DRESSINGS) ×1 IMPLANT
GLOVE BIOGEL PI IND STRL 7.0 (GLOVE) IMPLANT
GLOVE BIOGEL PI INDICATOR 7.0 (GLOVE) ×2
GLOVE SS BIOGEL STRL SZ 6.5 (GLOVE) IMPLANT
GLOVE SUPERSENSE BIOGEL SZ 6.5 (GLOVE) ×1
GLOVE SURG SIGNA 7.5 PF LTX (GLOVE) ×2 IMPLANT
GOWN BRE IMP PREV XXLGXLNG (GOWN DISPOSABLE) ×2 IMPLANT
GOWN STRL NON-REIN LRG LVL3 (GOWN DISPOSABLE) ×2 IMPLANT
GUIDEWIRE UNCOATED ST S 7038 (WIRE) IMPLANT
INTRODUCER 13FR (MISCELLANEOUS) IMPLANT
INTRODUCER COOK 11FR (CATHETERS) IMPLANT
KIT BASIN OR (CUSTOM PROCEDURE TRAY) ×2 IMPLANT
KIT PORT POWER 9.6FR MRI PREA (Catheter) ×1 IMPLANT
KIT PORT POWER ISP 8FR (Catheter) IMPLANT
KIT POWER CATH 8FR (Catheter) IMPLANT
KIT ROOM TURNOVER OR (KITS) ×2 IMPLANT
NDL HYPO 25X1 1.5 SAFETY (NEEDLE) IMPLANT
NEEDLE 22X1 1/2 (OR ONLY) (NEEDLE) ×2 IMPLANT
NEEDLE HYPO 25X1 1.5 SAFETY (NEEDLE) IMPLANT
NS IRRIG 1000ML POUR BTL (IV SOLUTION) ×2 IMPLANT
PACK GENERAL/GYN (CUSTOM PROCEDURE TRAY) ×2 IMPLANT
PAD ARMBOARD 7.5X6 YLW CONV (MISCELLANEOUS) ×4 IMPLANT
SET SHEATH INTRODUCER 10FR (MISCELLANEOUS) IMPLANT
SPONGE GAUZE 4X4 12PLY (GAUZE/BANDAGES/DRESSINGS) ×2 IMPLANT
SUT SILK 2 0 SH (SUTURE) ×2 IMPLANT
SUT VIC AB 3-0 SH 27 (SUTURE) ×2
SUT VIC AB 3-0 SH 27X BRD (SUTURE) ×1 IMPLANT
SYR 20CC LL (SYRINGE) ×2 IMPLANT
SYR CONTROL 10ML LL (SYRINGE) ×2 IMPLANT
TAPE CLOTH SURG 4X10 WHT LF (GAUZE/BANDAGES/DRESSINGS) ×1 IMPLANT
TOWEL OR 17X24 6PK STRL BLUE (TOWEL DISPOSABLE) ×2 IMPLANT
TOWEL OR 17X26 10 PK STRL BLUE (TOWEL DISPOSABLE) ×2 IMPLANT
WATER STERILE IRR 1000ML POUR (IV SOLUTION) ×2 IMPLANT

## 2011-08-10 NOTE — Anesthesia Preprocedure Evaluation (Signed)
Anesthesia Evaluation   Patient awake    Reviewed: Allergy & Precautions, H&P , NPO status , Patient's Chart, lab work & pertinent test results  Airway Mallampati: II TM Distance: >3 FB Neck ROM: Full    Dental   Pulmonary shortness of breath, COPDformer smoker + rhonchi        Cardiovascular     Neuro/Psych    GI/Hepatic   Endo/Other  Hypothyroidism   Renal/GU      Musculoskeletal   Abdominal   Peds  Hematology   Anesthesia Other Findings   Reproductive/Obstetrics                           Anesthesia Physical Anesthesia Plan  ASA: III  Anesthesia Plan: MAC   Post-op Pain Management:    Induction: Intravenous  Airway Management Planned: Simple Face Mask  Additional Equipment:   Intra-op Plan:   Post-operative Plan:   Informed Consent: I have reviewed the patients History and Physical, chart, labs and discussed the procedure including the risks, benefits and alternatives for the proposed anesthesia with the patient or authorized representative who has indicated his/her understanding and acceptance.     Plan Discussed with: CRNA and Surgeon  Anesthesia Plan Comments:         Anesthesia Quick Evaluation

## 2011-08-10 NOTE — Anesthesia Postprocedure Evaluation (Signed)
  Anesthesia Post-op Note  Patient: Joseph [name]  Procedure(s) Performed:  INSERTION PORT-A-CATH - Left Subclavian  Patient Location: PACU  Anesthesia Type: MAC  Level of Consciousness: awake, alert  and oriented  Airway and Oxygen Therapy: Patient Spontanous Breathing  Post-op Pain: mild  Post-op Assessment: Post-op Vital signs reviewed, Patient's Cardiovascular Status Stable, Respiratory Function Stable, Patent Airway and No signs of Nausea or vomiting  Post-op Vital Signs: stable  Complications: No apparent anesthesia complications

## 2011-08-10 NOTE — H&P (View-Only) (Signed)
HPI patient returns for followup. PET scan was positive for mediastinal adenopathy and a possiblei a contralateral mediastinal node. The right upper lobe lesion was also positive. Pulmonary function test showed severe obstructive pulmonary disease with a decreased DLCO. Brain scan was negative for metastasis. 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 We will refer him to Dr. early or Dr. Darrick Penna for evaluation. He has a clinically stage IIIa non-small cell lung cancer. His biopsies showed non-small cell lung cancer. He is probably adenocarcinoma but will await final report. He was seen today by Dr. Mitzi Hansen as well as Dr. Gwenyth Bouillon. They will initiate treatment.  Current Outpatient Prescriptions  Medication Sig Dispense Refill  . celecoxib (CELEBREX) 200 MG capsule Take 200 mg by mouth 2 (two) times daily as needed.       . CHANTIX 1 MG tablet 1 mg 2 (two) times daily.       . Misc Natural Products (PROSTATE HEALTH) CAPS Take 1 capsule by mouth daily.        . Multiple Vitamins-Minerals (MULTIVITAMINS THER. W/MINERALS) TABS Take 1 tablet by mouth daily.        Marland Kitchen nystatin-triamcinolone (MYCOLOG II) cream Apply 1 application topically daily as needed.       Marland Kitchen RAPAFLO 8 MG CAPS capsule Take 8 mg by mouth daily with breakfast.       . pravastatin (PRAVACHOL) 40 MG tablet Take 40 mg by mouth daily.       Marland Kitchen SYNTHROID 175 MCG tablet Take 175 mcg by mouth daily.          Review of Systems: Unchanged   Physical Exam lungs are clear to auscultation percussion distant breath are distant Diagnostic Tests: Bronchoscopy biopsies revealed non-small cell lung cancer   Impression: Stage IIIa non-small cell lung cancer   Plan: Refer for radiation and chemotherapy

## 2011-08-10 NOTE — Progress Notes (Signed)
CC:   Ines Bloomer, M.D. Lajuana Matte, M.D.  REFERRING PHYSICIAN:  Ines Bloomer, M.D.  DIAGNOSIS:  Stage IIIA non-small cell lung cancer.  HISTORY OF PRESENT ILLNESS:  The patient is a 70 year old male who indicates that he has had some degree of shortness of breath for approximately 1 year or greater.  He notes that this has worsened over the last couple of months.  He denies any significant chest pain or other symptoms such as headache or vision changes.  The patient has been experiencing some fatigue as well.  A chest x-ray for preoperative evaluation prior to left knee surgery showed some potential abnormalities within the right upper lobe of the lung.  A CT scan of the chest with contrast was then performed on 07/20/2011.  This showed a spiculated mass in the right upper lobe measuring 3.5 x 2.5 cm.  This was associated with some prevascular lymph nodes and additional mediastinal lymph nodes including 1 AP window lymph node measuring up to 1.2 cm.  There is no hilar or axillary adenopathy present.  The patient has undergone a brain MRI, which was negative for any acute intracranial findings.  He has additionally undergone a PET scan, which was completed on 08/03/2011.  This did show that the right upper lobe mass and ipsilateral mediastinal adenopathy was hypermetabolic and this was felt to correspond to a T2a N2 N0 lung cancer if pathologically demonstrated. There was some very mild hypermetabolism in an non-enlarged left hilar lymph node.  This was felt to be potentially not related to this suspected primary process.  The patient did undergo a bronchoscopy on 08/05/2011.  There were no endobronchial abnormalities noted.  The patient's pathology did return positive for non-small cell lung cancer. There were  some features suggestive of glandular differentiation.  A FNA of a 4R lymph node which was done in endoscopically with EVIS was also positive for malignant  cells consistent with non-small cell lung cancer.  Therefore, it does appear that the patient is presenting with stage III non-small cell lung cancer and his case was discussed at lung conference this morning.  We discussed that a course of chemoradiotherapy would be appropriate for his case and he is seeing me today for consideration of radiation treatment.  PAST MEDICAL HISTORY:  Hyperlipidemia, emphysema, abdominal aneurysm enlarge prostate, arthritis, hypothyroidism, cataracts.  PAST SURGICAL HISTORY:  Hernia repair, vasectomy, finger surgery, knee arthroscopy, status post tonsillectomy, status post cardiac catheterization.  CURRENT MEDICATIONS:  Celebrex, Chantix, Mycolog-II cream, pravastatin, Rapaflo, Synthroid.  ALLERGIES:  No known drug allergies.  FAMILY HISTORY:  The patient states his father suffered from lung cancer.  SOCIAL HISTORY:  The patient lives in [place].  He is married and has 2 children.  The patient is a former smoker, as he states that he quit on 07/02/2011.  He smoked 1-1/2 packs per day for 40 years.  He does drink alcohol on an occasional basis.  REVIEW OF SYSTEMS:  Fully reviewed and documented in the medical chart. This is  negative other than positive for cough and as discussed in the HPI.  PHYSICAL EXAMINATION:  Weight 280 pounds, blood  pressure 183/112, pulse 110, temperature 97.3, O2 saturation 90%.  General:  Well-developed male in no acute distress.  Alert and oriented x3.  HEENT:  Normocephalic atraumatic.  Pupils equal, round, reactive to light.  Extraocular movements intact.  Neck:  Supple but lymphadenopathy.  Cardiovascular: Regular rate and rhythm.  Respiratory is clear to auscultation bilaterally.  GI:  Abdomen is soft, nontender, normal bowel sounds. Extremities:  No edema present.  Neurologic:  No focal deficits.  IMPRESSION AND PLAN:  The patient is a pleasant 70 year old male who has been diagnosed with stage III non-small  cell lung cancer, stage IIIA versus IIIB.  The patient does not have any evidence of distant disease on his PET scan.  I do believe that the patient would be an appropriate candidate to proceed with a course of radiation for an approximate 6-1/2 week course of treatment with concurrent chemotherapy.  I would include the areas positive on PET scan and would also attempt to include the mildly hypermetabolic contralateral node as well to the extent possible. I do believe that it will be possible to include all of this in a radiation plan.  The patient's questions were answered and we did discuss the rationale of this treatment, including the possible side effects and risks.  All of his questions were answered.  It is my understanding that he is going to have his port placed early next week on Monday and we will have him present for simulation next week as well. I anticipate being able to begin his treatment on 08/17/2011.    ______________________________ Radene Gunning, M.D., Ph.D. JSM/MEDQ  D:  08/09/2011  T:  08/10/2011  Job:  [id number]

## 2011-08-10 NOTE — Telephone Encounter (Signed)
Talked to pt , gave him appt for chemo and the rest of appt for December

## 2011-08-10 NOTE — Op Note (Signed)
NAME:  NATION, CRADLE NO.:  [id number]  MEDICAL RECORD NO.:  [id number]  LOCATION:  [facility]                         FACILITY:  [facility]  PHYSICIAN:  Ines Bloomer, M.D. DATE OF BIRTH:  [birth year]  DATE OF PROCEDURE:  08/10/2011 DATE OF DISCHARGE:  08/10/2011                              OPERATIVE REPORT   PREOPERATIVE DIAGNOSIS:  Stage IIIA non-small cell lung cancer.  POSTOPERATIVE DIAGNOSE:  Stage IIIA non-small cell lung cancer.  OPERATION PERFORMED:  Insertion of left subclavian Port-A-Cath.  SURGEON:  Ines Bloomer, M.D.  ANESTHESIA:  1% Xylocaine and IV sedation.  DESCRIPTION OF PROCEDURE:  After prep and draping the left chest, an area was infiltrated with 1% Xylocaine and a left subclavian puncture was performed and a guidewire threaded to the right atrium.  Stab wound was made around the guidewire.  Another area was infiltrated with 1% Xylocaine inferior to this, a transverse incision was made and dissection was carried down by dissecting out a pocket for the 9.6 pre- attached Bard Port-A-Cath.  This was inserted in the pocket and sutured in place with 2-0 silk and then the tubing was tunneled from the pocket to the stab wound around the guidewire.  It was measured appropriately to go to the distal SVC with fluoro and cut appropriately.  Over the guidewire, I passed a dilator with peel-away sheath.  The dilator and guide wire were removed and the tubing passed through the peel-away sheath.  Peel-away sheath was removed.  This confirmed to be in the distal SVC by fluoro.  It flushed easily and withdrew blood easily. Wound was closed with 3-0 Vicryl and Dermabond for the skin.  The Port-A- Cath had been attached in place with 2-0 silk to prevent it from turning over.  Dry sterile dressing was applied.  The patient returned to recovery room in stable condition.     Ines Bloomer, M.D.     DPB/MEDQ  D:  08/10/2011  T:  08/10/2011  Job:   [id number]

## 2011-08-10 NOTE — Progress Notes (Signed)
Per Dr Donnald Garre, pt can start week of 12/30 and will need to let radiation know as well because chemo and radiation must be done at the same time.  Pt missed chemo education today due to port placement appt.  Onc tx schedule filled out to schedule weekly lab/chemo, chemo edu class, and f/u on week 3.  SLJ

## 2011-08-10 NOTE — Interval H&P Note (Signed)
History and Physical Interval Note:  08/10/2011 11:33 AM  Joseph [name]  has presented today for surgery, with the diagnosis of Ca lung  The various methods of treatment have been discussed with the patient and family. After consideration of risks, benefits and other options for treatment, the patient has consented to  Procedure(s): INSERTION PORT-A-CATH as a surgical intervention .  The patients' history has been reviewed, patient examined, no change in status, stable for surgery.  I have reviewed the patients' chart and labs.  Questions were answered to the patient's satisfaction.     [name]

## 2011-08-10 NOTE — Brief Op Note (Signed)
08/10/2011  12:33 PM  PATIENT:  Joseph [name]  70 y.o. male  PRE-OPERATIVE DIAGNOSIS:  Lung Cancer  POST-OPERATIVE DIAGNOSIS:  Lung Cancer  PROCEDURE:  Procedure(s): INSERTION PORT-A-CATH  SURGEON:  Surgeon(s): D Karle Plumber, MD  PHYSICIAN ASSISTANT:   ASSISTANTS: none   ANESTHESIA:   none  EBL:  Total I/O In: 700 [I.V.:700] Out: -   BLOOD ADMINISTERED:none  DRAINS: none   LOCAL MEDICATIONS USED:  XYLOCAINE 20CC  SPECIMEN:  No Specimen  DISPOSITION OF SPECIMEN:  N/A  COUNTS:  YES  TOURNIQUET:  * No tourniquets in log *  DICTATION: .Other Dictation: Dictation Number [id number]  PLAN OF CARE: Discharge to home after PACU  PATIENT DISPOSITION:  PACU - hemodynamically stable.   Delay start of Pharmacological VTE agent (>24hrs) due to surgical blood loss or risk of bleeding: yes

## 2011-08-10 NOTE — Preoperative (Signed)
Beta Blockers   Reason not to administer Beta Blockers:Not Applicable 

## 2011-08-10 NOTE — Transfer of Care (Signed)
Immediate Anesthesia Transfer of Care Note  Patient: Joseph [name]  Procedure(s) Performed:  INSERTION PORT-A-CATH - Left Subclavian  Patient Location: PACU  Anesthesia Type: General  Level of Consciousness: awake  Airway & Oxygen Therapy: Patient Spontanous Breathing  Post-op Assessment: Report given to PACU RN  Post vital signs: stable  Complications: No apparent anesthesia complications

## 2011-08-11 ENCOUNTER — Other Ambulatory Visit: Payer: Medicare Other

## 2011-08-12 ENCOUNTER — Encounter: Payer: Self-pay | Admitting: Vascular Surgery

## 2011-08-13 ENCOUNTER — Encounter (HOSPITAL_COMMUNITY): Payer: Self-pay | Admitting: Thoracic Surgery

## 2011-08-13 ENCOUNTER — Telehealth: Payer: Self-pay | Admitting: Internal Medicine

## 2011-08-13 ENCOUNTER — Ambulatory Visit
Admission: RE | Admit: 2011-08-13 | Discharge: 2011-08-13 | Disposition: A | Discharge status: Home / Self Care | Payer: Medicare Other | Source: Ambulatory Visit | Attending: Radiation Oncology | Admitting: Radiation Oncology

## 2011-08-13 ENCOUNTER — Other Ambulatory Visit: Payer: Medicare Other

## 2011-08-13 ENCOUNTER — Ambulatory Visit (INDEPENDENT_AMBULATORY_CARE_PROVIDER_SITE_OTHER): Payer: Medicare Other | Admitting: Thoracic Surgery

## 2011-08-13 VITALS — BP 118/83 | HR 88 | Resp 20 | Ht 72.0 in

## 2011-08-13 DIAGNOSIS — Z9889 Other specified postprocedural states: Secondary | ICD-10-CM

## 2011-08-13 DIAGNOSIS — C3410 Malignant neoplasm of upper lobe, unspecified bronchus or lung: Secondary | ICD-10-CM

## 2011-08-13 DIAGNOSIS — C3490 Malignant neoplasm of unspecified part of unspecified bronchus or lung: Secondary | ICD-10-CM

## 2011-08-13 NOTE — Progress Notes (Signed)
Met with patient to discuss RO billing.   Dx: 162.3 Upper lobe, lung Rad Tx: Extrl Beam [phone])   Attending Rad: Dr. Mitzi Hansen

## 2011-08-13 NOTE — Progress Notes (Signed)
HPI patient returns today after a Port-A-Cath insertion. The incisions are well-healed he'll see his this toward the end of the month. He started radiation at the present time. I will see him back again as needed  Current Outpatient Prescriptions  Medication Sig Dispense Refill  . celecoxib (CELEBREX) 200 MG capsule Take 200 mg by mouth 2 (two) times daily as needed.       Marland Kitchen HYDROcodone-acetaminophen (NORCO) 5-325 MG per tablet       . Multiple Vitamins-Minerals (MULTIVITAMINS THER. W/MINERALS) TABS Take 1 tablet by mouth daily.        Marland Kitchen nystatin-triamcinolone (MYCOLOG II) cream Apply 1 application topically daily as needed.       . pravastatin (PRAVACHOL) 40 MG tablet Take 40 mg by mouth daily.       Marland Kitchen RAPAFLO 8 MG CAPS capsule Take 8 mg by mouth daily with breakfast.       . SYNTHROID 175 MCG tablet Take 175 mcg by mouth daily.          Review of Systems: Unchanged   Physical Exam lungs are clear auscultation percussion   Diagnostic Tests: None   Impression: Stage IIIa non-small cell lung cancer   Plan: Radiation and chemotherapy

## 2011-08-13 NOTE — Telephone Encounter (Signed)
gve the pt his dec,jan 2013 appt calendar

## 2011-08-17 ENCOUNTER — Ambulatory Visit: Payer: Medicare Other

## 2011-08-19 ENCOUNTER — Encounter: Payer: Self-pay | Admitting: Vascular Surgery

## 2011-08-20 ENCOUNTER — Ambulatory Visit (INDEPENDENT_AMBULATORY_CARE_PROVIDER_SITE_OTHER): Payer: Medicare Other | Admitting: Vascular Surgery

## 2011-08-20 ENCOUNTER — Encounter: Payer: Self-pay | Admitting: Vascular Surgery

## 2011-08-20 VITALS — BP 115/76 | HR 99 | Temp 98.1°F | Ht 72.0 in | Wt 209.0 lb

## 2011-08-20 DIAGNOSIS — I714 Abdominal aortic aneurysm, without rupture, unspecified: Secondary | ICD-10-CM

## 2011-08-20 NOTE — Progress Notes (Signed)
[facility] HISTORY AND PHYSICAL   History of Present Illness:  Patient is a 70 y.o. year old male who presents for evaluation of abdominal aortic aneurysm.  The aneurysm is currently 5.1 cm in diameter by PET performed for lung cancer evaluation.  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 I reviewed these images today. The patient denies abdominal pain.  The patient denies back pain.  The patient has family history of AAA in his brother.  Other medical problems include stage 3 lung cancer and is about to start chemo radiation. 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 He denies significant cardiac history.  Past Medical History  Diagnosis Date  . History of colon polyps   . 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 Hyperlipidemia     takes Pravastatin  . Emphysema   . Lung mass     right upper lobe  . Shortness of breath     with exertion  . Cancer     lung  . Loose stools   . Enlarged prostate     takes Rapaflo daily  . Nocturia   . Arthritis     left knee and back arthrtis  . Hypothyroidism     takes Synthroid daily  . Cataract     Past Surgical History  Procedure Date  . Hernia repair 70's  . Vasectomy 70's  . Finger surgery 2008    left pointer finger  . Knee arthroscopy 2008    left knee  . Tonsillectomy     as a child  . Colonoscopy 2008  . Cardiac catheterization 2003  . Esophagogastroduodenoscopy   . Portacath placement 08/10/2011    Procedure: INSERTION PORT-A-CATH;  Surgeon: Norton Blizzard, MD;  Location: [facility] OR;  Service: Thoracic;  Laterality: Left;  Left Subclavian     Social History History  Substance Use Topics  . Smoking status: Former Smoker -- 1.5 packs/day for 40 years    Types: Cigarettes    Quit date: 07/02/2011  . Smokeless tobacco: Former Neurosurgeon  . Alcohol Use: Yes     social     Family History Family History  Problem Relation Age of Onset  . Anesthesia problems Neg Hx   . Hypotension Neg Hx   . Malignant hyperthermia Neg Hx   . Pseudochol deficiency Neg Hx   . Cancer Father   . Diabetes Father   . Hyperlipidemia Brother   . Aneurysm Brother     Abdominal aortic aneurysm    Allergies  No Known Allergies   Current Outpatient Prescriptions  Medication Sig Dispense Refill  . celecoxib (CELEBREX) 200 MG capsule Take 200 mg by mouth 2 (two) times daily as needed.       . Misc Natural Products (PROSTATE HEALTH) CAPS Take by mouth.        . Multiple Vitamins-Minerals (MULTIVITAMINS THER. W/MINERALS) TABS Take 1 tablet by mouth daily.        Marland Kitchen nystatin-triamcinolone (MYCOLOG II) cream Apply 1 application topically daily as needed.       . pravastatin (PRAVACHOL) 40 MG tablet Take 40 mg by mouth daily.       Marland Kitchen RAPAFLO 8 MG CAPS capsule Take 8 mg by mouth daily with breakfast.       . SYNTHROID 175  MCG tablet Take 175 mcg by mouth daily.       Marland Kitchen HYDROcodone-acetaminophen (NORCO) 5-325 MG per tablet         ROS:   General:  No weight loss, Fever, chills, has general fatigue  HEENT: No recent headaches, no nasal bleeding, no visual changes, no sore throat  Neurologic: No dizziness, blackouts, seizures. No recent symptoms of stroke or mini- stroke. No recent episodes of slurred speech, or temporary blindness.  Cardiac: No recent episodes of chest pain/pressure, no shortness of breath at rest.  Has shortness of breath with exertion.  Denies history of atrial fibrillation or irregular heartbeat  Vascular: No history of rest pain in feet.  No history of claudication.  No history of non-healing ulcer, No history of DVT   Pulmonary: No home oxygen, chronic cough, no hemoptysis,  No asthma or wheezing  Musculoskeletal:  [x ] Arthritis, [x ] Low back pain,  [x ] Joint pain  Hematologic:No history of hypercoagulable state.  No history of easy bleeding.  No  history of anemia  Gastrointestinal: No hematochezia or melena,  No gastroesophageal reflux, no trouble swallowing  Urinary: [ ]  chronic Kidney disease, [ ]  on HD - [ ]  MWF or [ ]  TTHS, [ ]  Burning with urination, [ ]  Frequent urination, [ ]  Difficulty urinating;   Skin: No rashes  Psychological: No history of anxiety,  No history of depression   Physical Examination  Filed Vitals:   08/20/11 0906  BP: 115/76  Pulse: 99  Temp: 98.1 F (36.7 C)  TempSrc: Oral  Height: 6' (1.829 m)  Weight: 209 lb (94.802 kg)  SpO2: 97%    Body mass index is 28.35 kg/(m^2).  General:  Alert and oriented, no acute distress HEENT: Normal Neck: No bruit or JVD Pulmonary: Clear to auscultation bilaterally Cardiac: Regular Rate and Rhythm without murmur Gastrointestinal: Soft, non-tender, non-distended, no mass, no scars Skin: No rash Extremity Pulses:  2+ radial, brachial, femoral, dorsalis pedis, posterior tibial pulses bilaterally Musculoskeletal: No deformity or edema  Neurologic: Upper and lower extremity motor 5/5 and symmetric    ASSESSMENT: Patient with large abdominal and iliac artery aneurysms. This is a difficult situation because he also needs treatment for his stage III lung cancer. I would not consider him an open aneurysm candidate because of the extent of his lung cancer. However we may be a left axis with a stent graft. This would require bilateral coil embolization of his internal iliac arteries. There is some low but present risk of: Infarction as well as buttock claudication sexual dysfunction with this. Although the risk of: Infarction is quite low this was certainly set back his cancer treatment if this occurred. I believe the best option at this point would be to go ahead and start his cancer treatments and then he will followup with me after his cancer treatment to consider operative repair of his aneurysm. Risk of aneurysm rupture as well as signs and symptoms were discussed  with the patient today as well as the risks versus benefit of cancer treatment versus aneurysm treatment first. I also called Dr. Arbutus Ped today and we discussed the patient's care plan and agree that he will have his cancer treatment first.    Fabienne Bruns, MD [facility] Office: [phone] Pager: [phone]

## 2011-08-21 ENCOUNTER — Encounter: Payer: Self-pay | Admitting: Internal Medicine

## 2011-08-24 ENCOUNTER — Ambulatory Visit: Payer: Medicare Other

## 2011-08-27 ENCOUNTER — Ambulatory Visit: Payer: Medicare Other | Admitting: Physician Assistant

## 2011-08-28 ENCOUNTER — Ambulatory Visit: Payer: Medicare Other | Admitting: Physician Assistant

## 2011-08-28 ENCOUNTER — Ambulatory Visit: Payer: Medicare Other

## 2011-08-31 ENCOUNTER — Other Ambulatory Visit (HOSPITAL_BASED_OUTPATIENT_CLINIC_OR_DEPARTMENT_OTHER): Payer: Medicare Other | Admitting: Lab

## 2011-08-31 ENCOUNTER — Other Ambulatory Visit: Payer: Self-pay | Admitting: *Deleted

## 2011-08-31 ENCOUNTER — Encounter: Payer: Self-pay | Admitting: Physician Assistant

## 2011-08-31 ENCOUNTER — Telehealth: Payer: Self-pay | Admitting: Internal Medicine

## 2011-08-31 ENCOUNTER — Other Ambulatory Visit: Payer: Self-pay | Admitting: Internal Medicine

## 2011-08-31 ENCOUNTER — Ambulatory Visit (HOSPITAL_BASED_OUTPATIENT_CLINIC_OR_DEPARTMENT_OTHER): Payer: Medicare Other | Admitting: Physician Assistant

## 2011-08-31 ENCOUNTER — Ambulatory Visit (HOSPITAL_BASED_OUTPATIENT_CLINIC_OR_DEPARTMENT_OTHER): Payer: Medicare Other

## 2011-08-31 ENCOUNTER — Ambulatory Visit: Payer: Medicare Other

## 2011-08-31 VITALS — BP 142/91 | HR 91 | Temp 97.3°F | Ht 72.0 in | Wt 211.4 lb

## 2011-08-31 VITALS — BP 118/77 | HR 83 | Temp 98.0°F

## 2011-08-31 DIAGNOSIS — C3490 Malignant neoplasm of unspecified part of unspecified bronchus or lung: Secondary | ICD-10-CM

## 2011-08-31 DIAGNOSIS — Z5111 Encounter for antineoplastic chemotherapy: Secondary | ICD-10-CM

## 2011-08-31 DIAGNOSIS — F411 Generalized anxiety disorder: Secondary | ICD-10-CM

## 2011-08-31 DIAGNOSIS — Z938 Other artificial opening status: Secondary | ICD-10-CM

## 2011-08-31 DIAGNOSIS — C3410 Malignant neoplasm of upper lobe, unspecified bronchus or lung: Secondary | ICD-10-CM

## 2011-08-31 LAB — CBC WITH DIFFERENTIAL/PLATELET
BASO%: 0.3 % (ref 0.0–2.0)
Basophils Absolute: 0 10*3/uL (ref 0.0–0.1)
EOS%: 2.3 % (ref 0.0–7.0)
HGB: 15.8 g/dL (ref 13.0–17.1)
MCH: 33 pg (ref 27.2–33.4)
MCHC: 34.4 g/dL (ref 32.0–36.0)
RBC: 4.77 10*6/uL (ref 4.20–5.82)
RDW: 13.5 % (ref 11.0–14.6)
lymph#: 2.2 10*3/uL (ref 0.9–3.3)

## 2011-08-31 LAB — COMPREHENSIVE METABOLIC PANEL
ALT: 30 U/L (ref 0–53)
AST: 30 U/L (ref 0–37)
Albumin: 3.5 g/dL (ref 3.5–5.2)
Calcium: 9 mg/dL (ref 8.4–10.5)
Chloride: 104 mEq/L (ref 96–112)
Potassium: 4.3 mEq/L (ref 3.5–5.3)
Sodium: 138 mEq/L (ref 135–145)
Total Protein: 7.2 g/dL (ref 6.0–8.3)

## 2011-08-31 MED ORDER — SODIUM CHLORIDE 0.9 % IV SOLN
Freq: Once | INTRAVENOUS | Status: AC
  Administered 2011-08-31: 11:00:00 via INTRAVENOUS

## 2011-08-31 MED ORDER — SODIUM CHLORIDE 0.9 % IV SOLN
300.0000 mg | Freq: Once | INTRAVENOUS | Status: AC
  Administered 2011-08-31: 300 mg via INTRAVENOUS
  Filled 2011-08-31: qty 30

## 2011-08-31 MED ORDER — ONDANSETRON 16 MG/50ML IVPB (CHCC)
16.0000 mg | Freq: Once | INTRAVENOUS | Status: AC
  Administered 2011-08-31: 16 mg via INTRAVENOUS

## 2011-08-31 MED ORDER — DIPHENHYDRAMINE HCL 50 MG/ML IJ SOLN
50.0000 mg | Freq: Once | INTRAMUSCULAR | Status: AC
  Administered 2011-08-31: 50 mg via INTRAVENOUS

## 2011-08-31 MED ORDER — LORAZEPAM 0.5 MG PO TABS: 0.5000 mg | ORAL_TABLET | Freq: Three times a day (TID) | ORAL | Status: AC

## 2011-08-31 MED ORDER — FAMOTIDINE IN NACL 20-0.9 MG/50ML-% IV SOLN
20.0000 mg | Freq: Once | INTRAVENOUS | Status: AC
  Administered 2011-08-31: 20 mg via INTRAVENOUS

## 2011-08-31 MED ORDER — DEXTROSE 5 % IV SOLN
45.0000 mg/m2 | Freq: Once | INTRAVENOUS | Status: AC
  Administered 2011-08-31: 96 mg via INTRAVENOUS
  Filled 2011-08-31: qty 16

## 2011-08-31 MED ORDER — DEXAMETHASONE SODIUM PHOSPHATE 4 MG/ML IJ SOLN
20.0000 mg | Freq: Once | INTRAMUSCULAR | Status: AC
Start: 2011-08-31 — End: 2011-08-31
  Administered 2011-08-31: 20 mg via INTRAVENOUS

## 2011-08-31 NOTE — Patient Instructions (Signed)
Patient ambulatory out of clinic, instructed to call with any issues.  Patient aware of next appointment 

## 2011-08-31 NOTE — Telephone Encounter (Signed)
gv pt appt schedule for jan °

## 2011-08-31 NOTE — Progress Notes (Signed)
Patient received first Taxol/Carbo treatment 08/31/2011.  Vital signs remained stable and within normal limits during Taxol treatment.  Patient had no complaints throughout.  Instructed patient to maintain good oral hygiene and begin checking temperature regularly.  Call with any temperatures greater than 100.5.  Patient and wife expressed understanding.

## 2011-08-31 NOTE — Progress Notes (Signed)
No images are attached to the encounter. No scans are attached to the encounter. No scans are attached to the encounter. [facility] OFFICE PROGRESS NOTE  [name], MD, MD [address] [place] [address]  DIAGNOSIS: Stage IIIa/IIIB non-small cell lung cancer  PRIOR THERAPY: None  CURRENT THERAPY: Concurrent chemoradiation with chemotherapy in the form of weekly carboplatin for an AUC of 2 and paclitaxel at 45 mg per meter squared  INTERVAL HISTORY: Joseph [name] 70 y.o. male returns for a scheduled regular office visit for followup of his stage III A./IIIB non-small cell lung cancer. He presents to begin his weekly chemotherapy. He is scheduled to begin the radiation portion on 09/02/2011. HEENT his wife opted to wait until after the Christmas holiday to proceed with his treatment so as to be able to enjoy the holiday with her family. Today he complains of some anxiety that is making it difficult for him to sleep well. He is status post Port-A-Cath placement. He is currently taking 2 over-the-counter supplements that he has been on for about the last year. There is a prostate health formula and a supplement called clear lungs that he is been taking. He brought a bottle of clear lungs to see if he could continue on this well undergoing concurrent chemoradiation.   MEDICAL HISTORY: Past Medical History  Diagnosis Date  . History of colon polyps   . 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 Hyperlipidemia     takes Pravastatin  . Emphysema   . Lung mass     right upper lobe  . Shortness of breath     with exertion  . Cancer     lung  . Loose stools   . Enlarged prostate     takes Rapaflo daily  . Nocturia   . Arthritis     left knee and back arthrtis  . Hypothyroidism     takes Synthroid daily  . Cataract     ALLERGIES:   has no known allergies.  MEDICATIONS:  Current Outpatient Prescriptions  Medication Sig Dispense Refill  . celecoxib (CELEBREX) 200  MG capsule Take 200 mg by mouth 2 (two) times daily as needed.       Marland Kitchen HYDROcodone-acetaminophen (NORCO) 5-325 MG per tablet       . lidocaine-prilocaine (EMLA) cream Apply 1 application topically as needed.        Marland Kitchen LORazepam (ATIVAN) 0.5 MG tablet Take 1 tablet (0.5 mg total) by mouth every 8 (eight) hours.  30 tablet  0  . Misc Natural Products (PROSTATE HEALTH) CAPS Take by mouth.        . Multiple Vitamins-Minerals (MULTIVITAMINS THER. W/MINERALS) TABS Take 1 tablet by mouth daily.        Marland Kitchen nystatin-triamcinolone (MYCOLOG II) cream Apply 1 application topically daily as needed.       . pravastatin (PRAVACHOL) 40 MG tablet Take 40 mg by mouth daily.       Marland Kitchen RAPAFLO 8 MG CAPS capsule Take 8 mg by mouth daily with breakfast.       . SYNTHROID 175 MCG tablet Take 175 mcg by mouth daily.        No current facility-administered medications for this visit.   Facility-Administered Medications Ordered in Other Visits  Medication Dose Route Frequency Provider Last Rate Last Dose  . 0.9 %  sodium chloride infusion   Intravenous Once Mohamed K. Mohamed, MD 20 mL/hr at 08/31/11 1045    .  dexamethasone (DECADRON) injection 20 mg  20 mg Intravenous Once Mohamed K. Mohamed, MD   20 mg at 08/31/11 1045  . diphenhydrAMINE (BENADRYL) injection 50 mg  50 mg Intravenous Once Mohamed K. Mohamed, MD   50 mg at 08/31/11 1045  . famotidine (PEPCID) IVPB 20 mg  20 mg Intravenous Once Mohamed K. Mohamed, MD   20 mg at 08/31/11 1100  . ondansetron (ZOFRAN) IVPB 16 mg  16 mg Intravenous Once Mohamed K. Mohamed, MD   16 mg at 08/31/11 1045  . PACLitaxel (TAXOL) 96 mg in dextrose 5 % 250 mL chemo infusion (</= 80mg /m2)  45 mg/m2 (Treatment Plan Actual) Intravenous Once Mohamed K. Mohamed, MD 73 mL/hr at 08/31/11 1127 96 mg at 08/31/11 1127    SURGICAL HISTORY:  Past Surgical History  Procedure Date  . Hernia repair 70's  . Vasectomy 70's  . Finger surgery 2008    left pointer finger  . Knee arthroscopy 2008     left knee  . Tonsillectomy     as a child  . Colonoscopy 2008  . Cardiac catheterization 2003  . Esophagogastroduodenoscopy   . Portacath placement 08/10/2011    Procedure: INSERTION PORT-A-CATH;  Surgeon: Norton Blizzard, MD;  Location: [facility] OR;  Service: Thoracic;  Laterality: Left;  Left Subclavian    REVIEW OF SYSTEMS:  A comprehensive review of systems was negative except for: Constitutional: positive for Anxiety and difficulty sleeping.   PHYSICAL EXAMINATION: General appearance: alert, cooperative and no distress Head: Normocephalic, without obvious abnormality, atraumatic Neck: no adenopathy, no carotid bruit, no JVD, supple, symmetrical, trachea midline and thyroid not enlarged, symmetric, no tenderness/mass/nodules Lymph nodes: Cervical, supraclavicular, and axillary nodes normal. Resp: clear to auscultation bilaterally Cardio: regular rate and rhythm, S1, S2 normal, no murmur, click, rub or gallop GI: soft, non-tender; bowel sounds normal; no masses,  no organomegaly Extremities: extremities normal, atraumatic, no cyanosis or edema Neurologic: Alert and oriented X 3, normal strength and tone. Normal symmetric reflexes. Normal coordination and gait  ECOG PERFORMANCE STATUS: 1 - Symptomatic but completely ambulatory  Blood pressure 142/91, pulse 91, temperature 97.3 F (36.3 C), temperature source Oral, height 6' (1.829 m), weight 211 lb 6.4 oz (95.89 kg).  LABORATORY DATA: Lab Results  Component Value Date   WBC 7.2 08/31/2011   HGB 15.8 08/31/2011   HCT 45.8 08/31/2011   MCV 95.9 08/31/2011   PLT 185 08/31/2011      Chemistry      Component Value Date/Time   NA 138 08/31/2011 0843   K 4.3 08/31/2011 0843   CL 104 08/31/2011 0843   CO2 25 08/31/2011 0843   BUN 12 08/31/2011 0843   CREATININE 0.69 08/31/2011 0843   CREATININE 0.71 07/30/2011 1640      Component Value Date/Time   CALCIUM 9.0 08/31/2011 0843   ALKPHOS 138* 08/31/2011 0843   AST 30 08/31/2011  0843   ALT 30 08/31/2011 0843   BILITOT 1.2 08/31/2011 0843       RADIOGRAPHIC STUDIES:  Dg Chest 2 View Within Previous 72 Hours.  Films Obtained On Friday Are Acceptable For Monday And Tuesday Cases  08/10/2011  *RADIOLOGY REPORT*  Clinical Data: Port-A-Cath insertion.  New diagnosis of lung cancer.  CHEST - 2 VIEW  Comparison: Plain film of 08/04/2009.  P E T of 08/02/2009.  Findings: Underlying COPD. Midline trachea.  Normal heart size. Mediastinal adenopathy described on CT and PET is not readily apparent on plain film. No pleural effusion or  pneumothorax.  Right upper lobe lung mass.  Interstitial thickening in the lower lobes is unchanged.  No evidence of acute superimposed process.  IMPRESSION: Right upper lobe lung mass, without evidence of superimposed process.  Original Report Authenticated By: Consuello Bossier, M.D.   Dg Chest 2 View Within Previous 72 Hours.  Films Obtained On Friday Are Acceptable For Monday And Tuesday Cases  08/04/2011  *RADIOLOGY REPORT*  Clinical Data: Preop bronchoscopy  CHEST - 2 VIEW  Comparison: 08/02/2010  Findings: Heart size appears normal.  No pleural effusion or pulmonary edema identified.  There is a mass within the right upper lobe which measures 3.30 x 4.6 cm.  No acute airspace consolidation.  IMPRESSION:  1.  Right upper lobe lung mass. 2.  Emphysema.  Original Report Authenticated By: Rosealee Albee, M.D.   Mr Laqueta Jean EA Contrast  08/05/2011  *RADIOLOGY REPORT*  Clinical Data:   Dizziness.  History of right upper lobe  mass. Evaluate for metastatic disease.  MRI HEAD WITHOUT AND WITH CONTRAST  Technique:  Multiplanar, multiecho pulse sequences of the brain and surrounding structures were obtained according to standard protocol without and with intravenous contrast  Contrast: 10mL MULTIHANCE GADOBENATE DIMEGLUMINE 529 MG/ML IV SOLN  Comparison: None.  Findings:  Gray-white matter differentiation is normal. The sella appears normal.  There is mild  heterogeneous signal of the inferior aspect of the clivus.  Cerebellar tonsils are above the level of the foramen magnum.  The odontoid process, the predental space and the prevertebral soft tissues are within normal limits.  The subcutaneous tissues of the scalp demonstrate focal nonenhancing hypointensity areas over the parietal occipital region, of unknown significance.  No diffusion weighted abnormalities seen.  Axial FLAIR and T2-weighted images demonstrate prominence of sulci overlying cerebral convexities with overlying prominence of the subarachnoid space left greater right.  A few scattered foci of signal hyperintensity are seen in the subcortical white matter bifrontally, the centrum semi ovale, the biparietal subcortical white matter and the right paramedian pons . There is  asymmetry of the right paramedian pons anteriorly.  The ventricles are normal.  Mastoids are clear.  The internal auditory canals are symmetrical in signal and morphology.  Flow voids are maintained in the major vessels at the cranial skull base with tortuosity of the basilar artery in the prepontine cistern in contact with the right paramedian pons.  Moderate inflammatory thickening of the mucosa in   the ethmoid air cells, the maxillary sinuses  and the frontal sinuses is  seen. Visualized orbital contents are grossly normal.  No abnormal blood breakdown products seen on SPGR sequences.  No pathological intracranial enhancement seen. Dural venous sinuses are grossly patent.  IMPRESSION: 1.  No evidence of acute ischemia, or of  pathological intracranial enhancement.  2.  Nonspecific subcortical white matter changes supra and infratentorially.  These probably represent areas of gliosis related to chronic microvascular ischemia.  3.  Ectasia of the basilar artery and mass effect on the right paramedian pons anteriorly 4. Diffuse atrophy. 5.  Inflammatory reaction of the mucosa in the paranasal sinuses.  Original Report Authenticated  By: Oneal Grout, M.D.   Nm Pet Image Initial (pi) Skull Base To Thigh  08/03/2011  *RADIOLOGY REPORT*  Clinical Data:  Initial treatment strategy for right upper lobe mass.  NUCLEAR MEDICINE PET CT INITIAL (PI) SKULL BASE TO THIGH  Technique:  19.6 mCi F-18 FDG was injected intravenously via the right antecubital fossa.  Full-ring PET imaging was  performed from the skull base through the mid-thighs 63  minutes after injection. CT data was obtained and used for attenuation correction and anatomic localization only.  (This was not acquired as a diagnostic CT examination.)  Fasting Blood Glucose:  98  Patient Weight:  208 pounds.  Comparison:  CT chest 07/20/2011.  Findings: An irregular mass in the right upper lobe measures 3.2 x 2.7 cm, with an S U V max of 25.6.  And its lateral prevascular lymph node measures 1.8 cm in short axis, with an S U V max of 14.4.  A low right paratracheal lymph node measures 2 cm in short axis, with an S U V max of 23.9.  There is faint uptake in the left hilum, with an S U V max of 3.9.  On diagnostic CT examination performed in November, there is an unenlarged lymph node in the left hilum.  A 12 mm AP window lymph node does not show hypermetabolism. A 10 mm nodule along the minor fissure does not show hypermetabolism.  No additional areas of abnormal hypermetabolism in the neck, chest, abdomen or pelvis.  CT images were performed for attenuation correction.  No acute findings in the neck.  Coronary artery calcification.  No pericardial or pleural effusion.  Liver is somewhat irregular along its margin.  Small stones and/or renal vascular calcifications are seen in the kidneys.  There is an infrarenal aortic aneurysm, measuring 5.1 x 5.0 cm. Left common iliac artery measures 4.2 cm and right common iliac artery measures 2.8 cm.  Prostate indents the bladder base.  Small right inguinal hernia contains fat.  IMPRESSION:  1.  Right upper lobe mass with ipsilateral mediastinal  adenopathy, most consistent with primary bronchogenic carcinoma.  If this is a non-small cell lung cancer, it is most consistent with T2aN2M0 or stage IIIA disease. Note is made that this is down staged from examination performed 07/20/2011. 2.  Very mild hypermetabolism in an unenlarged left hilar lymph node.  Given the intense hypermetabolism of right mediastinal adenopathy, metastatic involvement of the left hilum would be expected to yield a much higher SUV max as well. 3.  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 4.  Coronary artery calcification. 5.  Cirrhosis.  Original Report Authenticated By: Reyes Ivan, M.D.   Dg Chest Port 1 View  08/10/2011  *RADIOLOGY REPORT*  Clinical Data: Lung cancer.  PORTABLE CHEST - 1 VIEW  Comparison: Chest x-ray 08/10/2011.  Findings: Left subclavian Port-A-Cath in good position with its tip in the mid SVC.  No complicating features are demonstrated.  Stable emphysematous changes and right upper lobe mass.  IMPRESSION: Left subclavian Port-A-Cath and good position without complicating features.  Original Report Authenticated By: P. Loralie Champagne, M.D.   Dg Chest Port 1 View  08/05/2011  *RADIOLOGY REPORT*  Clinical Data: Right bronchoscopy.  PORTABLE CHEST - 1 VIEW  Comparison: 08/04/2011  Findings: Stable right upper lobe lung mass.  No pneumothorax following bronchoscopy.  Underlying COPD changes.  Bibasilar atelectasis or infiltrates, increased since prior study, possibly related to bronchoscopy.  Heart is normal size.  IMPRESSION: No pneumothorax following bronchoscopy.  COPD.  Increasing bibasilar opacities.  Original Report Authenticated By: Cyndie Chime, M.D.   Dg Fluoro Guide Cv Line-no Report  08/10/2011  CLINICAL DATA: needed for port-a-cath placement   FLOURO GUIDE CV LINE  Fluoroscopy was utilized by the requesting physician.  No radiographic  interpretation.     Dg  C-arm 1-60 Min-no  Report  08/05/2011  CLINICAL DATA: needed for case Brochial Navaigation   C-ARM 1-60 MINUTES  Fluoroscopy was utilized by the requesting physician.  No radiographic  interpretation.       ASSESSMENT/PLAN: The patient is a very pleasant 70 year-old white male recently diagnosed with stage IIIa/IIIB non-small cell lung cancer. The patient was discussed with Dr. Arbutus Ped. He will proceed with his first weekly cycle of chemotherapy with carboplatin for an AUC of 2 and paclitaxel at 45 mg per meter square. To address his anxiety is given a prescription for Ativan 0.5 mg one tablet by mouth every 8 hours as needed for anxiety a total of 30 with no refill. He'll return in one week for a symptom management visit with a repeat CBC differential and C. met. His clear lungs a supplement was reviewed with our [facility] pharmacy and it was recommended he not continue on a supplement as there were components in it or that will interfere with the efficacy of his chemotherapy. He voices understanding. When he returns for a symptom management visit he will resume the bottle for the prostate health supplement as well as his multi-vitamin bottle so we can check dose as well.     Conni Slipper, PA-C     All questions were answered. The patient knows to call the clinic with any problems, questions or concerns. We can certainly see the patient much sooner if necessary.

## 2011-09-02 ENCOUNTER — Ambulatory Visit
Admission: RE | Admit: 2011-09-02 | Discharge: 2011-09-02 | Disposition: A | Discharge status: Home / Self Care | Payer: Medicare Other | Source: Ambulatory Visit | Attending: Radiation Oncology | Admitting: Radiation Oncology

## 2011-09-02 ENCOUNTER — Telehealth: Payer: Self-pay | Admitting: *Deleted

## 2011-09-02 DIAGNOSIS — C3410 Malignant neoplasm of upper lobe, unspecified bronchus or lung: Secondary | ICD-10-CM

## 2011-09-02 LAB — FUNGUS CULTURE W SMEAR

## 2011-09-02 NOTE — Telephone Encounter (Signed)
Patient returned call.  Reports having "headache that started Tuesday, 09-01-2011.  Otherwise. not doing so bad".  Reports eating and drinking fluids well.  No N/V and no problems with bowels.  Remote history of migraines.  Reports headache is not continuous.  It is dull and he feels it especially when he coughs.  Asked if it is okay to use the hydrocodone prescribed by Dr. Edwyna Shell if Tylenol doesn't work.  This is okay but urged to try tylenol if med doesn't help.

## 2011-09-02 NOTE — Telephone Encounter (Signed)
Left VM for patient to call back with status since Taxol/Carbo on 12/31

## 2011-09-02 NOTE — Progress Notes (Signed)
Post sim ed done w/pt and significant other. Gave pt "RAdiation and You" booklet, all questions answered. Informed pt he may need to see dietician during tx, particularly if he begins to lose wgt. Pt verbalized understanding of post sim ed.

## 2011-09-02 NOTE — Telephone Encounter (Signed)
No note

## 2011-09-03 ENCOUNTER — Ambulatory Visit: Admission: RE | Admit: 2011-09-03 | Payer: Medicare Other | Source: Ambulatory Visit

## 2011-09-03 ENCOUNTER — Ambulatory Visit
Admission: RE | Admit: 2011-09-03 | Discharge: 2011-09-03 | Disposition: A | Discharge status: Home / Self Care | Payer: Medicare Other | Source: Ambulatory Visit | Attending: Radiation Oncology | Admitting: Radiation Oncology

## 2011-09-03 NOTE — Progress Notes (Signed)
[facility] Radiation Oncology Weekly Treatment Note    Name: Joseph [name] Date: 09/03/2011 MRN: [id number] DOB: [birth year]  Status: outpatient    Current dose: 200  Current fraction:1  Planned dose:6600  Planned fraction:33   MEDICATIONS: Current Outpatient Prescriptions  Medication Sig Dispense Refill  . celecoxib (CELEBREX) 200 MG capsule Take 200 mg by mouth 2 (two) times daily as needed.       Marland Kitchen HYDROcodone-acetaminophen (NORCO) 5-325 MG per tablet       . lidocaine-prilocaine (EMLA) cream Apply 1 application topically as needed.        Marland Kitchen LORazepam (ATIVAN) 0.5 MG tablet Take 1 tablet (0.5 mg total) by mouth every 8 (eight) hours.  30 tablet  0  . Misc Natural Products (PROSTATE HEALTH) CAPS Take by mouth.        . Multiple Vitamins-Minerals (MULTIVITAMINS THER. W/MINERALS) TABS Take 1 tablet by mouth daily.        Marland Kitchen nystatin-triamcinolone (MYCOLOG II) cream Apply 1 application topically daily as needed.       . pravastatin (PRAVACHOL) 40 MG tablet Take 40 mg by mouth daily.       Marland Kitchen RAPAFLO 8 MG CAPS capsule Take 8 mg by mouth daily with breakfast.       . SYNTHROID 175 MCG tablet Take 175 mcg by mouth daily.          ALLERGIES: Review of patient's allergies indicates no known allergies.   LABORATORY DATA:  Lab Results  Component Value Date   WBC 7.2 08/31/2011   HGB 15.8 08/31/2011   HCT 45.8 08/31/2011   MCV 95.9 08/31/2011   PLT 185 08/31/2011   Lab Results  Component Value Date   NA 138 08/31/2011   K 4.3 08/31/2011   CL 104 08/31/2011   CO2 25 08/31/2011   Lab Results  Component Value Date   ALT 30 08/31/2011   AST 30 08/31/2011   ALKPHOS 138* 08/31/2011   BILITOT 1.2 08/31/2011      NARRATIVE: Joseph [name] was seen today for weekly treatment management. The chart was checked and CBCT images were reviewed. Pt doing well. Has begun chemo. Has had 1 fx of xrt so far.  PHYSICAL EXAMINATION: vitals were not taken for  this visit.      ASSESSMENT: Patient tolerating treatments well.    PLAN: Continue treatment as planned.

## 2011-09-03 NOTE — Progress Notes (Signed)
Pt unable to receive treatment due to machine down time.  1st treatment on yesterday.  Received post sim education on yesterday.  Reiterated for patient to inform us if he has and irritation of the esophagus since a small portion of his esophagus is in the treatment field.   Received 1st chemotherapy on 08/31/11

## 2011-09-04 ENCOUNTER — Ambulatory Visit
Admission: RE | Admit: 2011-09-04 | Discharge: 2011-09-04 | Disposition: A | Discharge status: Home / Self Care | Payer: Medicare Other | Source: Ambulatory Visit | Attending: Radiation Oncology | Admitting: Radiation Oncology

## 2011-09-07 ENCOUNTER — Ambulatory Visit (HOSPITAL_BASED_OUTPATIENT_CLINIC_OR_DEPARTMENT_OTHER): Payer: Medicare Other

## 2011-09-07 ENCOUNTER — Ambulatory Visit
Admission: RE | Admit: 2011-09-07 | Discharge: 2011-09-07 | Disposition: A | Discharge status: Home / Self Care | Payer: Medicare Other | Source: Ambulatory Visit | Attending: Radiation Oncology | Admitting: Radiation Oncology

## 2011-09-07 ENCOUNTER — Other Ambulatory Visit (HOSPITAL_BASED_OUTPATIENT_CLINIC_OR_DEPARTMENT_OTHER): Payer: Medicare Other | Admitting: Lab

## 2011-09-07 ENCOUNTER — Encounter: Payer: Self-pay | Admitting: *Deleted

## 2011-09-07 VITALS — BP 144/75 | HR 103 | Temp 96.9°F

## 2011-09-07 DIAGNOSIS — C3410 Malignant neoplasm of upper lobe, unspecified bronchus or lung: Secondary | ICD-10-CM

## 2011-09-07 DIAGNOSIS — Z5111 Encounter for antineoplastic chemotherapy: Secondary | ICD-10-CM

## 2011-09-07 LAB — COMPREHENSIVE METABOLIC PANEL
AST: 30 U/L (ref 0–37)
Albumin: 3.7 g/dL (ref 3.5–5.2)
Alkaline Phosphatase: 127 U/L — ABNORMAL HIGH (ref 39–117)
BUN: 19 mg/dL (ref 6–23)
Potassium: 4.2 mEq/L (ref 3.5–5.3)
Sodium: 140 mEq/L (ref 135–145)
Total Bilirubin: 1 mg/dL (ref 0.3–1.2)
Total Protein: 6.7 g/dL (ref 6.0–8.3)

## 2011-09-07 LAB — CBC WITH DIFFERENTIAL/PLATELET
BASO%: 0.4 % (ref 0.0–2.0)
EOS%: 1.6 % (ref 0.0–7.0)
MCH: 32.3 pg (ref 27.2–33.4)
MCHC: 34.9 g/dL (ref 32.0–36.0)
MCV: 92.5 fL (ref 79.3–98.0)
MONO%: 7.9 % (ref 0.0–14.0)
RDW: 13 % (ref 11.0–14.6)
lymph#: 2.3 10*3/uL (ref 0.9–3.3)

## 2011-09-07 MED ORDER — SODIUM CHLORIDE 0.9 % IV SOLN
Freq: Once | INTRAVENOUS | Status: AC
  Administered 2011-09-07: 13:00:00 via INTRAVENOUS

## 2011-09-07 MED ORDER — HEPARIN SOD (PORK) LOCK FLUSH 100 UNIT/ML IV SOLN
500.0000 [IU] | Freq: Once | INTRAVENOUS | Status: AC | PRN
  Administered 2011-09-07: 500 [IU]
  Filled 2011-09-07: qty 5

## 2011-09-07 MED ORDER — DIPHENHYDRAMINE HCL 50 MG/ML IJ SOLN
50.0000 mg | Freq: Once | INTRAMUSCULAR | Status: AC
  Administered 2011-09-07: 50 mg via INTRAVENOUS

## 2011-09-07 MED ORDER — SODIUM CHLORIDE 0.9 % IV SOLN
300.0000 mg | Freq: Once | INTRAVENOUS | Status: AC
  Administered 2011-09-07: 300 mg via INTRAVENOUS
  Filled 2011-09-07: qty 30

## 2011-09-07 MED ORDER — DEXAMETHASONE SODIUM PHOSPHATE 4 MG/ML IJ SOLN
20.0000 mg | Freq: Once | INTRAMUSCULAR | Status: AC
  Administered 2011-09-07: 20 mg via INTRAVENOUS

## 2011-09-07 MED ORDER — PACLITAXEL CHEMO INJECTION 300 MG/50ML
45.0000 mg/m2 | Freq: Once | INTRAVENOUS | Status: AC
  Administered 2011-09-07: 96 mg via INTRAVENOUS
  Filled 2011-09-07: qty 16

## 2011-09-07 MED ORDER — SODIUM CHLORIDE 0.9 % IJ SOLN
10.0000 mL | INTRAMUSCULAR | Status: DC | PRN
  Administered 2011-09-07: 10 mL
  Filled 2011-09-07: qty 10

## 2011-09-07 MED ORDER — FAMOTIDINE IN NACL 20-0.9 MG/50ML-% IV SOLN
20.0000 mg | Freq: Once | INTRAVENOUS | Status: AC
  Administered 2011-09-07: 20 mg via INTRAVENOUS

## 2011-09-07 MED ORDER — ONDANSETRON 16 MG/50ML IVPB (CHCC)
16.0000 mg | Freq: Once | INTRAVENOUS | Status: AC
  Administered 2011-09-07: 16 mg via INTRAVENOUS

## 2011-09-07 NOTE — Patient Instructions (Signed)
Pt dc'd, going to rad onc. Aware of next appt. Told to call in any problems.

## 2011-09-08 ENCOUNTER — Ambulatory Visit
Admission: RE | Admit: 2011-09-08 | Discharge: 2011-09-08 | Disposition: A | Discharge status: Home / Self Care | Payer: Medicare Other | Source: Ambulatory Visit | Attending: Radiation Oncology | Admitting: Radiation Oncology

## 2011-09-09 ENCOUNTER — Telehealth: Payer: Self-pay | Admitting: Internal Medicine

## 2011-09-09 ENCOUNTER — Ambulatory Visit (HOSPITAL_BASED_OUTPATIENT_CLINIC_OR_DEPARTMENT_OTHER): Payer: Medicare Other | Admitting: Physician Assistant

## 2011-09-09 ENCOUNTER — Ambulatory Visit
Admission: RE | Admit: 2011-09-09 | Discharge: 2011-09-09 | Disposition: A | Discharge status: Home / Self Care | Payer: Medicare Other | Source: Ambulatory Visit | Attending: Radiation Oncology | Admitting: Radiation Oncology

## 2011-09-09 VITALS — BP 138/87 | HR 102 | Temp 97.4°F | Ht 72.0 in | Wt 212.8 lb

## 2011-09-09 DIAGNOSIS — Z79899 Other long term (current) drug therapy: Secondary | ICD-10-CM

## 2011-09-09 DIAGNOSIS — C3410 Malignant neoplasm of upper lobe, unspecified bronchus or lung: Secondary | ICD-10-CM

## 2011-09-09 NOTE — Telephone Encounter (Signed)
Pt already on schedule for tx 1/14, 1/21, and 1/28 w/AJ on 1/21. Per AJ keep f/u for 1/21 no need to add 1/28. Pt informed schedule will stay as is and already has calendar.

## 2011-09-10 ENCOUNTER — Ambulatory Visit
Admission: RE | Admit: 2011-09-10 | Discharge: 2011-09-10 | Disposition: A | Discharge status: Home / Self Care | Payer: Medicare Other | Source: Ambulatory Visit | Attending: Radiation Oncology | Admitting: Radiation Oncology

## 2011-09-10 ENCOUNTER — Encounter: Payer: Medicare Other | Admitting: Vascular Surgery

## 2011-09-11 ENCOUNTER — Ambulatory Visit
Admission: RE | Admit: 2011-09-11 | Discharge: 2011-09-11 | Disposition: A | Discharge status: Home / Self Care | Payer: Medicare Other | Source: Ambulatory Visit | Attending: Radiation Oncology | Admitting: Radiation Oncology

## 2011-09-11 ENCOUNTER — Encounter: Payer: Self-pay | Admitting: Radiation Oncology

## 2011-09-11 DIAGNOSIS — C3410 Malignant neoplasm of upper lobe, unspecified bronchus or lung: Secondary | ICD-10-CM

## 2011-09-11 NOTE — Progress Notes (Signed)
[facility] Radiation Oncology Weekly Treatment Note    Name: Joseph [name] Date: 09/11/2011 MRN: [id number] DOB: 1941-06-09  Status: outpatient    Current dose: 1400  Current fraction:7  Planned dose:6600  Planned fraction:33   MEDICATIONS: Current Outpatient Prescriptions  Medication Sig Dispense Refill  . celecoxib (CELEBREX) 200 MG capsule Take 200 mg by mouth 2 (two) times daily as needed.       Marland Kitchen HYDROcodone-acetaminophen (NORCO) 5-325 MG per tablet       . lidocaine-prilocaine (EMLA) cream Apply 1 application topically as needed.        Marland Kitchen LORazepam (ATIVAN) 0.5 MG tablet Take 1 tablet (0.5 mg total) by mouth every 8 (eight) hours.  30 tablet  0  . Misc Natural Products (PROSTATE HEALTH) CAPS Take by mouth.        . Multiple Vitamins-Minerals (MULTIVITAMINS THER. W/MINERALS) TABS Take 1 tablet by mouth daily.        Marland Kitchen nystatin-triamcinolone (MYCOLOG II) cream Apply 1 application topically daily as needed.       . pravastatin (PRAVACHOL) 40 MG tablet Take 40 mg by mouth daily.       Marland Kitchen RAPAFLO 8 MG CAPS capsule Take 8 mg by mouth daily with breakfast.       . SYNTHROID 175 MCG tablet Take 175 mcg by mouth daily.          ALLERGIES: Review of patient's allergies indicates no known allergies.   LABORATORY DATA:  Lab Results  Component Value Date   WBC 6.8 09/07/2011   HGB 15.6 09/07/2011   HCT 44.7 09/07/2011   MCV 92.5 09/07/2011   PLT 134* 09/07/2011   Lab Results  Component Value Date   NA 140 09/07/2011   K 4.2 09/07/2011   CL 106 09/07/2011   CO2 25 09/07/2011   Lab Results  Component Value Date   ALT 33 09/07/2011   AST 30 09/07/2011   ALKPHOS 127* 09/07/2011   BILITOT 1.0 09/07/2011      NARRATIVE: Joseph [name] was seen today for weekly treatment management. The chart was checked and CBCT images were reviewed. Pt doing well. No complaints.  PHYSICAL EXAMINATION: weight is 211 lb 1.6 oz (95.754 kg).       ASSESSMENT: Patient tolerating  treatments well.    PLAN: Continue treatment as planned.

## 2011-09-11 NOTE — Progress Notes (Signed)
Pt doing well, no c/o. 

## 2011-09-13 NOTE — Progress Notes (Signed)
No images are attached to the encounter. No scans are attached to the encounter. No scans are attached to the encounter.  [facility] OFFICE PROGRESS NOTE  [name], MD, MD [address] [place] [address]  DIAGNOSIS: Stage IIIa/IIIB non-small cell lung cancer  PRIOR THERAPY: None  CURRENT THERAPY: Concurrent chemoradiation with chemotherapy in the form of weekly carboplatin for an AUC of 2 and paclitaxel at 45 mg per meter squared  INTERVAL HISTORY: Joseph [name] 71 y.o. male returns for a scheduled regular office visit for followup of his stage III A./IIIB non-small cell lung cancer. Overall he is tolerating his concurrent chemoradiation without difficulty. He does mention a mild headache that occurred a couple days after chemotherapy. He had a worse headache after the first cycle of chemotherapy, less so after the second cycle. He notes a "twitch" between her shoulder blades the last for 2-3 minutes but voices no other complaints.    MEDICAL HISTORY: Past Medical History  Diagnosis Date  . History of colon polyps   . 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 Hyperlipidemia     takes Pravastatin  . Emphysema   . Lung mass     right upper lobe  . Shortness of breath     with exertion  . Cancer     lung  . Loose stools   . Enlarged prostate     takes Rapaflo daily  . Nocturia   . Arthritis     left knee and back arthrtis  . Hypothyroidism     takes Synthroid daily  . Cataract     ALLERGIES:   has no known allergies.  MEDICATIONS:  Current Outpatient Prescriptions  Medication Sig Dispense Refill  . celecoxib (CELEBREX) 200 MG capsule Take 200 mg by mouth 2 (two) times daily as needed.       Marland Kitchen HYDROcodone-acetaminophen (NORCO) 5-325 MG per tablet       . lidocaine-prilocaine (EMLA) cream Apply 1 application topically as needed.        . Misc Natural Products (PROSTATE HEALTH) CAPS Take by mouth.        . Multiple Vitamins-Minerals (MULTIVITAMINS  THER. W/MINERALS) TABS Take 1 tablet by mouth daily.        Marland Kitchen nystatin-triamcinolone (MYCOLOG II) cream Apply 1 application topically daily as needed.       . pravastatin (PRAVACHOL) 40 MG tablet Take 40 mg by mouth daily.       Marland Kitchen RAPAFLO 8 MG CAPS capsule Take 8 mg by mouth daily with breakfast.       . SYNTHROID 175 MCG tablet Take 175 mcg by mouth daily.         SURGICAL HISTORY:  Past Surgical History  Procedure Date  . Hernia repair 70's  . Vasectomy 70's  . Finger surgery 2008    left pointer finger  . Knee arthroscopy 2008    left knee  . Tonsillectomy     as a child  . Colonoscopy 2008  . Cardiac catheterization 2003  . Esophagogastroduodenoscopy   . Portacath placement 08/10/2011    Procedure: INSERTION PORT-A-CATH;  Surgeon: Norton Blizzard, MD;  Location: [facility] OR;  Service: Thoracic;  Laterality: Left;  Left Subclavian    REVIEW OF SYSTEMS:  A comprehensive review of systems was negative except for: Constitutional: positive for headache   PHYSICAL EXAMINATION: General appearance: alert, cooperative and no distress Head: Normocephalic, without obvious abnormality, atraumatic Neck: no adenopathy, no carotid bruit,  no JVD, supple, symmetrical, trachea midline and thyroid not enlarged, symmetric, no tenderness/mass/nodules Lymph nodes: Cervical, supraclavicular, and axillary nodes normal. Resp: clear to auscultation bilaterally Cardio: regular rate and rhythm, S1, S2 normal, no murmur, click, rub or gallop GI: soft, non-tender; bowel sounds normal; no masses,  no organomegaly Extremities: extremities normal, atraumatic, no cyanosis or edema Neurologic: Alert and oriented X 3, normal strength and tone. Normal symmetric reflexes. Normal coordination and gait  ECOG PERFORMANCE STATUS: 1 - Symptomatic but completely ambulatory  Blood pressure 138/87, pulse 102, temperature 97.4 F (36.3 C), temperature source Oral, height 6' (1.829 m), weight 212 lb 12.8 oz (96.525  kg).  LABORATORY DATA: Lab Results  Component Value Date   WBC 6.8 09/07/2011   HGB 15.6 09/07/2011   HCT 44.7 09/07/2011   MCV 92.5 09/07/2011   PLT 134* 09/07/2011      Chemistry      Component Value Date/Time   NA 140 09/07/2011 1212   K 4.2 09/07/2011 1212   CL 106 09/07/2011 1212   CO2 25 09/07/2011 1212   BUN 19 09/07/2011 1212   CREATININE 0.71 09/07/2011 1212   CREATININE 0.71 07/30/2011 1640      Component Value Date/Time   CALCIUM 9.0 09/07/2011 1212   ALKPHOS 127* 09/07/2011 1212   AST 30 09/07/2011 1212   ALT 33 09/07/2011 1212   BILITOT 1.0 09/07/2011 1212       RADIOGRAPHIC STUDIES:  Dg Chest 2 View Within Previous 72 Hours.  Films Obtained On Friday Are Acceptable For Monday And Tuesday Cases  08/10/2011  *RADIOLOGY REPORT*  Clinical Data: Port-A-Cath insertion.  New diagnosis of lung cancer.  CHEST - 2 VIEW  Comparison: Plain film of 08/04/2009.  P E T of 08/02/2009.  Findings: Underlying COPD. Midline trachea.  Normal heart size. Mediastinal adenopathy described on CT and PET is not readily apparent on plain film. No pleural effusion or pneumothorax.  Right upper lobe lung mass.  Interstitial thickening in the lower lobes is unchanged.  No evidence of acute superimposed process.  IMPRESSION: Right upper lobe lung mass, without evidence of superimposed process.  Original Report Authenticated By: Consuello Bossier, M.D.   Dg Chest 2 View Within Previous 72 Hours.  Films Obtained On Friday Are Acceptable For Monday And Tuesday Cases  08/04/2011  *RADIOLOGY REPORT*  Clinical Data: Preop bronchoscopy  CHEST - 2 VIEW  Comparison: 08/02/2010  Findings: Heart size appears normal.  No pleural effusion or pulmonary edema identified.  There is a mass within the right upper lobe which measures 3.30 x 4.6 cm.  No acute airspace consolidation.  IMPRESSION:  1.  Right upper lobe lung mass. 2.  Emphysema.  Original Report Authenticated By: Rosealee Albee, M.D.   Mr Laqueta Jean ZO Contrast  08/05/2011   *RADIOLOGY REPORT*  Clinical Data:   Dizziness.  History of right upper lobe  mass. Evaluate for metastatic disease.  MRI HEAD WITHOUT AND WITH CONTRAST  Technique:  Multiplanar, multiecho pulse sequences of the brain and surrounding structures were obtained according to standard protocol without and with intravenous contrast  Contrast: 10mL MULTIHANCE GADOBENATE DIMEGLUMINE 529 MG/ML IV SOLN  Comparison: None.  Findings:  Gray-white matter differentiation is normal. The sella appears normal.  There is mild heterogeneous signal of the inferior aspect of the clivus.  Cerebellar tonsils are above the level of the foramen magnum.  The odontoid process, the predental space and the prevertebral soft tissues are within normal limits.  The  subcutaneous tissues of the scalp demonstrate focal nonenhancing hypointensity areas over the parietal occipital region, of unknown significance.  No diffusion weighted abnormalities seen.  Axial FLAIR and T2-weighted images demonstrate prominence of sulci overlying cerebral convexities with overlying prominence of the subarachnoid space left greater right.  A few scattered foci of signal hyperintensity are seen in the subcortical white matter bifrontally, the centrum semi ovale, the biparietal subcortical white matter and the right paramedian pons . There is  asymmetry of the right paramedian pons anteriorly.  The ventricles are normal.  Mastoids are clear.  The internal auditory canals are symmetrical in signal and morphology.  Flow voids are maintained in the major vessels at the cranial skull base with tortuosity of the basilar artery in the prepontine cistern in contact with the right paramedian pons.  Moderate inflammatory thickening of the mucosa in   the ethmoid air cells, the maxillary sinuses  and the frontal sinuses is  seen. Visualized orbital contents are grossly normal.  No abnormal blood breakdown products seen on SPGR sequences.  No pathological intracranial enhancement  seen. Dural venous sinuses are grossly patent.  IMPRESSION: 1.  No evidence of acute ischemia, or of  pathological intracranial enhancement.  2.  Nonspecific subcortical white matter changes supra and infratentorially.  These probably represent areas of gliosis related to chronic microvascular ischemia.  3.  Ectasia of the basilar artery and mass effect on the right paramedian pons anteriorly 4. Diffuse atrophy. 5.  Inflammatory reaction of the mucosa in the paranasal sinuses.  Original Report Authenticated By: Oneal Grout, M.D.   Nm Pet Image Initial (pi) Skull Base To Thigh  08/03/2011  *RADIOLOGY REPORT*  Clinical Data:  Initial treatment strategy for right upper lobe mass.  NUCLEAR MEDICINE PET CT INITIAL (PI) SKULL BASE TO THIGH  Technique:  19.6 mCi F-18 FDG was injected intravenously via the right antecubital fossa.  Full-ring PET imaging was performed from the skull base through the mid-thighs 63  minutes after injection. CT data was obtained and used for attenuation correction and anatomic localization only.  (This was not acquired as a diagnostic CT examination.)  Fasting Blood Glucose:  98  Patient Weight:  208 pounds.  Comparison:  CT chest 07/20/2011.  Findings: An irregular mass in the right upper lobe measures 3.2 x 2.7 cm, with an S U V max of 25.6.  And its lateral prevascular lymph node measures 1.8 cm in short axis, with an S U V max of 14.4.  A low right paratracheal lymph node measures 2 cm in short axis, with an S U V max of 23.9.  There is faint uptake in the left hilum, with an S U V max of 3.9.  On diagnostic CT examination performed in November, there is an unenlarged lymph node in the left hilum.  A 12 mm AP window lymph node does not show hypermetabolism. A 10 mm nodule along the minor fissure does not show hypermetabolism.  No additional areas of abnormal hypermetabolism in the neck, chest, abdomen or pelvis.  CT images were performed for attenuation correction.  No acute  findings in the neck.  Coronary artery calcification.  No pericardial or pleural effusion.  Liver is somewhat irregular along its margin.  Small stones and/or renal vascular calcifications are seen in the kidneys.  There is an infrarenal aortic aneurysm, measuring 5.1 x 5.0 cm. Left common iliac artery measures 4.2 cm and right common iliac artery measures 2.8 cm.  Prostate indents the bladder  base.  Small right inguinal hernia contains fat.  IMPRESSION:  1.  Right upper lobe mass with ipsilateral mediastinal adenopathy, most consistent with primary bronchogenic carcinoma.  If this is a non-small cell lung cancer, it is most consistent with T2aN2M0 or stage IIIA disease. Note is made that this is down staged from examination performed 07/20/2011. 2.  Very mild hypermetabolism in an unenlarged left hilar lymph node.  Given the intense hypermetabolism of right mediastinal adenopathy, metastatic involvement of the left hilum would be expected to yield a much higher SUV max as well. 3.  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 4.  Coronary artery calcification. 5.  Cirrhosis.  Original Report Authenticated By: Reyes Ivan, M.D.   Dg Chest Port 1 View  08/10/2011  *RADIOLOGY REPORT*  Clinical Data: Lung cancer.  PORTABLE CHEST - 1 VIEW  Comparison: Chest x-ray 08/10/2011.  Findings: Left subclavian Port-A-Cath in good position with its tip in the mid SVC.  No complicating features are demonstrated.  Stable emphysematous changes and right upper lobe mass.  IMPRESSION: Left subclavian Port-A-Cath and good position without complicating features.  Original Report Authenticated By: P. Loralie Champagne, M.D.   Dg Chest Port 1 View  08/05/2011  *RADIOLOGY REPORT*  Clinical Data: Right bronchoscopy.  PORTABLE CHEST - 1 VIEW  Comparison: 08/04/2011  Findings: Stable right upper lobe lung mass.  No pneumothorax following bronchoscopy.  Underlying COPD changes.   Bibasilar atelectasis or infiltrates, increased since prior study, possibly related to bronchoscopy.  Heart is normal size.  IMPRESSION: No pneumothorax following bronchoscopy.  COPD.  Increasing bibasilar opacities.  Original Report Authenticated By: Cyndie Chime, M.D.   Dg Fluoro Guide Cv Line-no Report  08/10/2011  CLINICAL DATA: needed for port-a-cath placement   FLOURO GUIDE CV LINE  Fluoroscopy was utilized by the requesting physician.  No radiographic  interpretation.     Dg C-arm 1-60 Min-no Report  08/05/2011  CLINICAL DATA: needed for case Brochial Navaigation   C-ARM 1-60 MINUTES  Fluoroscopy was utilized by the requesting physician.  No radiographic  interpretation.       ASSESSMENT/PLAN: The patient is a very pleasant 71 year-old white male recently diagnosed with stage IIIa/IIIB non-small cell lung cancer. The patient was discussed with Dr. Arbutus Ped. He will proceed with his weekly chemotherapy with carboplatin for an AUC of 2 and paclitaxel at 45 mg per meter square. He'll return in 2 weeks for another symptom management visit with a repeat CBC differential and C. met.    Conni Slipper, PA-C     All questions were answered. The patient knows to call the clinic with any problems, questions or concerns. We can certainly see the patient much sooner if necessary.

## 2011-09-14 ENCOUNTER — Ambulatory Visit
Admission: RE | Admit: 2011-09-14 | Discharge: 2011-09-14 | Disposition: A | Discharge status: Home / Self Care | Payer: Medicare Other | Source: Ambulatory Visit | Attending: Radiation Oncology | Admitting: Radiation Oncology

## 2011-09-14 ENCOUNTER — Ambulatory Visit (HOSPITAL_BASED_OUTPATIENT_CLINIC_OR_DEPARTMENT_OTHER): Payer: Medicare Other

## 2011-09-14 ENCOUNTER — Encounter: Payer: Self-pay | Admitting: *Deleted

## 2011-09-14 ENCOUNTER — Other Ambulatory Visit: Payer: Medicare Other | Admitting: Lab

## 2011-09-14 VITALS — BP 137/88 | HR 86 | Temp 97.0°F

## 2011-09-14 DIAGNOSIS — Z5111 Encounter for antineoplastic chemotherapy: Secondary | ICD-10-CM

## 2011-09-14 DIAGNOSIS — C3410 Malignant neoplasm of upper lobe, unspecified bronchus or lung: Secondary | ICD-10-CM

## 2011-09-14 LAB — COMPREHENSIVE METABOLIC PANEL
AST: 28 U/L (ref 0–37)
Alkaline Phosphatase: 114 U/L (ref 39–117)
BUN: 11 mg/dL (ref 6–23)
Creatinine, Ser: 0.59 mg/dL (ref 0.50–1.35)
Potassium: 4.4 mEq/L (ref 3.5–5.3)

## 2011-09-14 LAB — CBC WITH DIFFERENTIAL/PLATELET
BASO%: 0.4 % (ref 0.0–2.0)
EOS%: 1.4 % (ref 0.0–7.0)
HCT: 42.6 % (ref 38.4–49.9)
LYMPH%: 29.9 % (ref 14.0–49.0)
MCH: 32.2 pg (ref 27.2–33.4)
MCHC: 34.7 g/dL (ref 32.0–36.0)
NEUT%: 59.4 % (ref 39.0–75.0)
lymph#: 1.5 10*3/uL (ref 0.9–3.3)

## 2011-09-14 MED ORDER — DIPHENHYDRAMINE HCL 50 MG/ML IJ SOLN
50.0000 mg | Freq: Once | INTRAMUSCULAR | Status: AC
  Administered 2011-09-14: 50 mg via INTRAVENOUS

## 2011-09-14 MED ORDER — HEPARIN SOD (PORK) LOCK FLUSH 100 UNIT/ML IV SOLN
500.0000 [IU] | Freq: Once | INTRAVENOUS | Status: AC | PRN
  Administered 2011-09-14: 500 [IU]
  Filled 2011-09-14: qty 5

## 2011-09-14 MED ORDER — DEXAMETHASONE SODIUM PHOSPHATE 4 MG/ML IJ SOLN
20.0000 mg | Freq: Once | INTRAMUSCULAR | Status: AC
  Administered 2011-09-14: 20 mg via INTRAVENOUS

## 2011-09-14 MED ORDER — ONDANSETRON 16 MG/50ML IVPB (CHCC)
16.0000 mg | Freq: Once | INTRAVENOUS | Status: AC
  Administered 2011-09-14: 16 mg via INTRAVENOUS

## 2011-09-14 MED ORDER — PACLITAXEL CHEMO INJECTION 300 MG/50ML
45.0000 mg/m2 | Freq: Once | INTRAVENOUS | Status: AC
  Administered 2011-09-14: 96 mg via INTRAVENOUS
  Filled 2011-09-14: qty 16

## 2011-09-14 MED ORDER — SODIUM CHLORIDE 0.9 % IV SOLN
300.0000 mg | Freq: Once | INTRAVENOUS | Status: AC
  Administered 2011-09-14: 300 mg via INTRAVENOUS
  Filled 2011-09-14: qty 30

## 2011-09-14 MED ORDER — SODIUM CHLORIDE 0.9 % IJ SOLN
10.0000 mL | INTRAMUSCULAR | Status: DC | PRN
  Administered 2011-09-14: 10 mL
  Filled 2011-09-14: qty 10

## 2011-09-14 MED ORDER — SODIUM CHLORIDE 0.9 % IV SOLN
Freq: Once | INTRAVENOUS | Status: AC
  Administered 2011-09-14: 11:00:00 via INTRAVENOUS

## 2011-09-14 MED ORDER — FAMOTIDINE IN NACL 20-0.9 MG/50ML-% IV SOLN
20.0000 mg | Freq: Once | INTRAVENOUS | Status: AC
  Administered 2011-09-14: 20 mg via INTRAVENOUS

## 2011-09-14 NOTE — Patient Instructions (Signed)
1315 Pt ambulatory upon discharge and verbalized understanding of next appt date/time.

## 2011-09-14 NOTE — Progress Notes (Signed)
Pt in lobby wanting to speak to desk RN.  Spoke to pt, he states he noticed a place on the top of his gum that was sore the other day and has stopped hurting but is hard.  He stated he is going to go the dentist about it and wanted to know if Dr Donnald Garre would be okay if he had to get dental work done while on chemo.  Asked pt to inform us of dentist's plan so we could possibly work around his chemo or wait until he takes a break.  Assessed place in pt's mouth.  Did not look inflamed or infected and has not had any bleeding.  Asked pt to keep [place] informed.  SLJ

## 2011-09-15 ENCOUNTER — Ambulatory Visit
Admission: RE | Admit: 2011-09-15 | Discharge: 2011-09-15 | Disposition: A | Discharge status: Home / Self Care | Payer: Medicare Other | Source: Ambulatory Visit | Attending: Radiation Oncology | Admitting: Radiation Oncology

## 2011-09-16 ENCOUNTER — Other Ambulatory Visit: Payer: Self-pay | Admitting: Internal Medicine

## 2011-09-16 ENCOUNTER — Ambulatory Visit
Admission: RE | Admit: 2011-09-16 | Discharge: 2011-09-16 | Disposition: A | Discharge status: Home / Self Care | Payer: Medicare Other | Source: Ambulatory Visit | Attending: Radiation Oncology | Admitting: Radiation Oncology

## 2011-09-17 ENCOUNTER — Ambulatory Visit
Admission: RE | Admit: 2011-09-17 | Discharge: 2011-09-17 | Disposition: A | Discharge status: Home / Self Care | Payer: Medicare Other | Source: Ambulatory Visit | Attending: Radiation Oncology | Admitting: Radiation Oncology

## 2011-09-17 ENCOUNTER — Encounter: Payer: Self-pay | Admitting: Radiation Oncology

## 2011-09-17 VITALS — Wt 212.5 lb

## 2011-09-17 DIAGNOSIS — C3410 Malignant neoplasm of upper lobe, unspecified bronchus or lung: Secondary | ICD-10-CM

## 2011-09-17 MED ORDER — RADIAPLEXRX EX GEL
Freq: Once | CUTANEOUS | Status: AC
  Administered 2011-09-17: 15:00:00 via TOPICAL

## 2011-09-17 NOTE — Progress Notes (Signed)
[facility] Radiation Oncology Weekly Treatment Note    Name: Joseph [name] Date: 09/17/2011 MRN: [id number] DOB: 05/24/1941  Status: outpatient    Current dose: 2200  Current fraction:11  Planned dose:6600  Planned fraction:33   MEDICATIONS: Current Outpatient Prescriptions  Medication Sig Dispense Refill  . celecoxib (CELEBREX) 200 MG capsule Take 200 mg by mouth 2 (two) times daily as needed.       Marland Kitchen HYDROcodone-acetaminophen (NORCO) 5-325 MG per tablet       . lidocaine-prilocaine (EMLA) cream Apply 1 application topically as needed.        . Misc Natural Products (PROSTATE HEALTH) CAPS Take by mouth.        . Multiple Vitamins-Minerals (MULTIVITAMINS THER. W/MINERALS) TABS Take 1 tablet by mouth daily.        Marland Kitchen nystatin-triamcinolone (MYCOLOG II) cream Apply 1 application topically daily as needed.       . pravastatin (PRAVACHOL) 40 MG tablet Take 40 mg by mouth daily.       Marland Kitchen RAPAFLO 8 MG CAPS capsule Take 8 mg by mouth daily with breakfast.       . SYNTHROID 175 MCG tablet Take 175 mcg by mouth daily.        Current Facility-Administered Medications  Medication Dose Route Frequency Provider Last Rate Last Dose  . hyaluronate sodium (RADIAPLEXRX) gel   Topical Once Jonna Coup, MD         ALLERGIES: Review of patient's allergies indicates no known allergies.   LABORATORY DATA:  Lab Results  Component Value Date   WBC 4.9 09/14/2011   HGB 14.8 09/14/2011   HCT 42.6 09/14/2011   MCV 92.6 09/14/2011   PLT 164 09/14/2011   Lab Results  Component Value Date   NA 138 09/14/2011   K 4.4 09/14/2011   CL 107 09/14/2011   CO2 24 09/14/2011   Lab Results  Component Value Date   ALT 26 09/14/2011   AST 28 09/14/2011   ALKPHOS 114 09/14/2011   BILITOT 1.0 09/14/2011      NARRATIVE: Joseph [name] was seen today for weekly treatment management. The chart was checked and CBCT images were reviewed. Pt doing well. Occasional pain b/t  shoulders.  PHYSICAL EXAMINATION: weight is 212 lb 8 oz (96.389 kg).        ASSESSMENT: Patient tolerating treatments well.    PLAN: Continue treatment as planned.

## 2011-09-17 NOTE — Progress Notes (Signed)
Pt states he had slight amt blood when he blew nose this morning; no further bleeding. Pt understands to see dr immediately if he has uncontrolled nose bleed.  Pt states mid r chest is "splotchy red with a few white pustules occass". Gave pt radiaplex w/instructions on use.  Pt also states he noticed past 2 wks that he gets "uncomfortable feeling between his shoulder blades at times. It goes away and is just annoying."

## 2011-09-18 ENCOUNTER — Ambulatory Visit
Admission: RE | Admit: 2011-09-18 | Discharge: 2011-09-18 | Disposition: A | Discharge status: Home / Self Care | Payer: Medicare Other | Source: Ambulatory Visit | Attending: Radiation Oncology | Admitting: Radiation Oncology

## 2011-09-20 ENCOUNTER — Other Ambulatory Visit: Payer: Self-pay | Admitting: Internal Medicine

## 2011-09-21 ENCOUNTER — Ambulatory Visit (HOSPITAL_BASED_OUTPATIENT_CLINIC_OR_DEPARTMENT_OTHER): Payer: Medicare Other

## 2011-09-21 ENCOUNTER — Telehealth: Payer: Self-pay | Admitting: Internal Medicine

## 2011-09-21 ENCOUNTER — Other Ambulatory Visit: Payer: Medicare Other | Admitting: Lab

## 2011-09-21 ENCOUNTER — Other Ambulatory Visit: Payer: Self-pay | Admitting: Certified Registered Nurse Anesthetist

## 2011-09-21 ENCOUNTER — Ambulatory Visit
Admission: RE | Admit: 2011-09-21 | Discharge: 2011-09-21 | Disposition: A | Discharge status: Home / Self Care | Payer: Medicare Other | Source: Ambulatory Visit | Attending: Radiation Oncology | Admitting: Radiation Oncology

## 2011-09-21 ENCOUNTER — Encounter: Payer: Self-pay | Admitting: Physician Assistant

## 2011-09-21 ENCOUNTER — Ambulatory Visit (HOSPITAL_BASED_OUTPATIENT_CLINIC_OR_DEPARTMENT_OTHER): Payer: Medicare Other | Admitting: Physician Assistant

## 2011-09-21 VITALS — BP 130/85 | HR 95 | Temp 97.3°F | Ht 72.0 in | Wt 213.5 lb

## 2011-09-21 DIAGNOSIS — R5381 Other malaise: Secondary | ICD-10-CM

## 2011-09-21 DIAGNOSIS — R222 Localized swelling, mass and lump, trunk: Secondary | ICD-10-CM

## 2011-09-21 DIAGNOSIS — C3410 Malignant neoplasm of upper lobe, unspecified bronchus or lung: Secondary | ICD-10-CM

## 2011-09-21 DIAGNOSIS — Z5111 Encounter for antineoplastic chemotherapy: Secondary | ICD-10-CM

## 2011-09-21 LAB — CBC WITH DIFFERENTIAL/PLATELET
Basophils Absolute: 0 10*3/uL (ref 0.0–0.1)
EOS%: 0.8 % (ref 0.0–7.0)
HCT: 43.2 % (ref 38.4–49.9)
HGB: 15.4 g/dL (ref 13.0–17.1)
MCH: 32.6 pg (ref 27.2–33.4)
NEUT%: 59.6 % (ref 39.0–75.0)
lymph#: 1.4 10*3/uL (ref 0.9–3.3)

## 2011-09-21 LAB — COMPREHENSIVE METABOLIC PANEL
Albumin: 3.8 g/dL (ref 3.5–5.2)
BUN: 12 mg/dL (ref 6–23)
Calcium: 8.8 mg/dL (ref 8.4–10.5)
Chloride: 107 mEq/L (ref 96–112)
Glucose, Bld: 85 mg/dL (ref 70–99)
Potassium: 4.2 mEq/L (ref 3.5–5.3)

## 2011-09-21 MED ORDER — HEPARIN SOD (PORK) LOCK FLUSH 100 UNIT/ML IV SOLN
500.0000 [IU] | Freq: Once | INTRAVENOUS | Status: AC | PRN
  Administered 2011-09-21: 500 [IU]
  Filled 2011-09-21: qty 5

## 2011-09-21 MED ORDER — SODIUM CHLORIDE 0.9 % IJ SOLN
10.0000 mL | INTRAMUSCULAR | Status: DC | PRN
  Administered 2011-09-21: 10 mL
  Filled 2011-09-21: qty 10

## 2011-09-21 MED ORDER — ONDANSETRON 16 MG/50ML IVPB (CHCC)
16.0000 mg | Freq: Once | INTRAVENOUS | Status: AC
  Administered 2011-09-21: 16 mg via INTRAVENOUS

## 2011-09-21 MED ORDER — DIPHENHYDRAMINE HCL 50 MG/ML IJ SOLN
50.0000 mg | Freq: Once | INTRAMUSCULAR | Status: AC
  Administered 2011-09-21: 50 mg via INTRAVENOUS

## 2011-09-21 MED ORDER — DEXAMETHASONE SODIUM PHOSPHATE 4 MG/ML IJ SOLN
20.0000 mg | Freq: Once | INTRAMUSCULAR | Status: AC
  Administered 2011-09-21: 20 mg via INTRAVENOUS

## 2011-09-21 MED ORDER — SODIUM CHLORIDE 0.9 % IV SOLN
300.0000 mg | Freq: Once | INTRAVENOUS | Status: AC
  Administered 2011-09-21: 300 mg via INTRAVENOUS
  Filled 2011-09-21: qty 30

## 2011-09-21 MED ORDER — PACLITAXEL CHEMO INJECTION 300 MG/50ML
45.0000 mg/m2 | Freq: Once | INTRAVENOUS | Status: AC
  Administered 2011-09-21: 96 mg via INTRAVENOUS
  Filled 2011-09-21: qty 16

## 2011-09-21 MED ORDER — FAMOTIDINE IN NACL 20-0.9 MG/50ML-% IV SOLN
20.0000 mg | Freq: Once | INTRAVENOUS | Status: AC
  Administered 2011-09-21: 20 mg via INTRAVENOUS

## 2011-09-21 MED ORDER — SODIUM CHLORIDE 0.9 % IV SOLN
Freq: Once | INTRAVENOUS | Status: AC
  Administered 2011-09-21: 100 mL via INTRAVENOUS

## 2011-09-21 NOTE — Telephone Encounter (Signed)
2/4 lab md,tx and 2/11 lab,tx made and printed for pt  aom

## 2011-09-21 NOTE — Patient Instructions (Signed)
Pt d/c'd with family member with no complaints, aware of future appts, ambulatory.  SLJ

## 2011-09-22 ENCOUNTER — Ambulatory Visit
Admission: RE | Admit: 2011-09-22 | Discharge: 2011-09-22 | Disposition: A | Discharge status: Home / Self Care | Payer: Medicare Other | Source: Ambulatory Visit | Attending: Radiation Oncology | Admitting: Radiation Oncology

## 2011-09-22 NOTE — Progress Notes (Signed)
No images are attached to the encounter. No scans are attached to the encounter. No scans are attached to the encounter. [facility] OFFICE PROGRESS NOTE  [name], MD, MD [address] [place] [address]  DIAGNOSIS: Stage IIIa/IIIB non-small cell lung cancer  PRIOR THERAPY: None  CURRENT THERAPY: Concurrent chemoradiation with chemotherapy in the form of weekly carboplatin for an AUC of 2 and paclitaxel at 45 mg per meter squared  INTERVAL HISTORY: Joseph [name] 71 y.o. male returns for a scheduled regular office visit for followup of his stage III A./IIIB non-small cell lung cancer. Overall he is tolerating his concurrent chemoradiation without difficulty with the exception of some fatigue.  He has been able to walk some for exercise and tolerates that without difficulty. He maintains good appetite. He continues to report a "tight feeling between his shoulder blades as well as the upper portion of the sternum. This discomfort as a fleeting feeling. He also notes a "mass" on his chest. He reports she's currently on a course of amoxicillin for gum infection.  MEDICAL HISTORY: Past Medical History  Diagnosis Date  . History of colon polyps   . 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 Hyperlipidemia     takes Pravastatin  . Emphysema   . Lung mass     right upper lobe  . Shortness of breath     with exertion  . Cancer     lung  . Loose stools   . Enlarged prostate     takes Rapaflo daily  . Nocturia   . Arthritis     left knee and back arthrtis  . Hypothyroidism     takes Synthroid daily  . Cataract     ALLERGIES:   has no known allergies.  MEDICATIONS:  Current Outpatient Prescriptions  Medication Sig Dispense Refill  . celecoxib (CELEBREX) 200 MG capsule Take 200 mg by mouth 2 (two) times daily as needed.       Marland Kitchen HYDROcodone-acetaminophen (NORCO) 5-325 MG per tablet       . lidocaine-prilocaine (EMLA) cream Apply 1 application topically as needed.         . Misc Natural Products (PROSTATE HEALTH) CAPS Take by mouth.        . Multiple Vitamins-Minerals (MULTIVITAMINS THER. W/MINERALS) TABS Take 1 tablet by mouth daily.        Marland Kitchen nystatin-triamcinolone (MYCOLOG II) cream Apply 1 application topically daily as needed.       . pravastatin (PRAVACHOL) 40 MG tablet Take 40 mg by mouth daily.       Marland Kitchen RAPAFLO 8 MG CAPS capsule Take 8 mg by mouth daily with breakfast.       . SYNTHROID 175 MCG tablet Take 175 mcg by mouth daily.        No current facility-administered medications for this visit.   Facility-Administered Medications Ordered in Other Visits  Medication Dose Route Frequency Provider Last Rate Last Dose  . 0.9 %  sodium chloride infusion   Intravenous Once Mohamed K. Mohamed, MD   100 mL at 09/21/11 1235  . CARBOplatin (PARAPLATIN) 300 mg in sodium chloride 0.9 % 100 mL chemo infusion  300 mg Intravenous Once Mohamed K. Mohamed, MD   300 mg at 09/21/11 1439  . heparin lock flush 100 unit/mL  500 Units Intracatheter Once PRN Mohamed K. Mohamed, MD   500 Units at 09/21/11 1520  . PACLitaxel (TAXOL) 96 mg in dextrose 5 % 250 mL  chemo infusion (</= 80mg /m2)  45 mg/m2 (Treatment Plan Actual) Intravenous Once Mohamed K. Mohamed, MD   96 mg at 09/21/11 1331  . DISCONTD: sodium chloride 0.9 % injection 10 mL  10 mL Intracatheter PRN Mohamed K. Mohamed, MD   10 mL at 09/21/11 1520    SURGICAL HISTORY:  Past Surgical History  Procedure Date  . Hernia repair 70's  . Vasectomy 70's  . Finger surgery 2008    left pointer finger  . Knee arthroscopy 2008    left knee  . Tonsillectomy     as a child  . Colonoscopy 2008  . Cardiac catheterization 2003  . Esophagogastroduodenoscopy   . Portacath placement 08/10/2011    Procedure: INSERTION PORT-A-CATH;  Surgeon: Norton Blizzard, MD;  Location: [facility] OR;  Service: Thoracic;  Laterality: Left;  Left Subclavian    REVIEW OF SYSTEMS:  A comprehensive review of systems was negative except for:  Constitutional: positive for fatigue Musculoskeletal: positive for Discomfort between his shoulder blades as well as the upper portion of the sternum which she describes it as a temporary tightness.   PHYSICAL EXAMINATION: General appearance: alert, cooperative and no distress Head: Normocephalic, without obvious abnormality, atraumatic Neck: no adenopathy, no carotid bruit, no JVD, supple, symmetrical, trachea midline and thyroid not enlarged, symmetric, no tenderness/mass/nodules Lymph nodes: Cervical, supraclavicular, and axillary nodes normal. Resp: clear to auscultation bilaterally Cardio: regular rate and rhythm, S1, S2 normal, no murmur, click, rub or gallop GI: soft, non-tender; bowel sounds normal; no masses,  no organomegaly Extremities: extremities normal, atraumatic, no cyanosis or edema Neurologic: Alert and oriented X 3, normal strength and tone. Normal symmetric reflexes. Normal coordination and gait There is a soft tissue prominence in the upper sternum that is nontender, no erythema, approximately 1 1/2 cm in size  ECOG PERFORMANCE STATUS: 1 - Symptomatic but completely ambulatory  Blood pressure 130/85, pulse 95, temperature 97.3 F (36.3 C), temperature source Oral, height 6' (1.829 m), weight 213 lb 8 oz (96.843 kg).  LABORATORY DATA: Lab Results  Component Value Date   WBC 4.8 09/21/2011   HGB 15.4 09/21/2011   HCT 43.2 09/21/2011   MCV 91.3 09/21/2011   PLT 152 09/21/2011      Chemistry      Component Value Date/Time   NA 138 09/21/2011 1056   K 4.2 09/21/2011 1056   CL 107 09/21/2011 1056   CO2 23 09/21/2011 1056   BUN 12 09/21/2011 1056   CREATININE 0.66 09/21/2011 1056   CREATININE 0.71 07/30/2011 1640      Component Value Date/Time   CALCIUM 8.8 09/21/2011 1056   ALKPHOS 115 09/21/2011 1056   AST 26 09/21/2011 1056   ALT 24 09/21/2011 1056   BILITOT 1.0 09/21/2011 1056       RADIOGRAPHIC STUDIES:  Dg Chest 2 View Within Previous 72 Hours.  Films Obtained  On Friday Are Acceptable For Monday And Tuesday Cases  08/10/2011  *RADIOLOGY REPORT*  Clinical Data: Port-A-Cath insertion.  New diagnosis of lung cancer.  CHEST - 2 VIEW  Comparison: Plain film of 08/04/2009.  P E T of 08/02/2009.  Findings: Underlying COPD. Midline trachea.  Normal heart size. Mediastinal adenopathy described on CT and PET is not readily apparent on plain film. No pleural effusion or pneumothorax.  Right upper lobe lung mass.  Interstitial thickening in the lower lobes is unchanged.  No evidence of acute superimposed process.  IMPRESSION: Right upper lobe lung mass, without evidence of superimposed process.  Original Report Authenticated By: Consuello Bossier, M.D.   Dg Chest 2 View Within Previous 72 Hours.  Films Obtained On Friday Are Acceptable For Monday And Tuesday Cases  08/04/2011  *RADIOLOGY REPORT*  Clinical Data: Preop bronchoscopy  CHEST - 2 VIEW  Comparison: 08/02/2010  Findings: Heart size appears normal.  No pleural effusion or pulmonary edema identified.  There is a mass within the right upper lobe which measures 3.30 x 4.6 cm.  No acute airspace consolidation.  IMPRESSION:  1.  Right upper lobe lung mass. 2.  Emphysema.  Original Report Authenticated By: Rosealee Albee, M.D.   Mr Laqueta Jean RU Contrast  08/05/2011  *RADIOLOGY REPORT*  Clinical Data:   Dizziness.  History of right upper lobe  mass. Evaluate for metastatic disease.  MRI HEAD WITHOUT AND WITH CONTRAST  Technique:  Multiplanar, multiecho pulse sequences of the brain and surrounding structures were obtained according to standard protocol without and with intravenous contrast  Contrast: 10mL MULTIHANCE GADOBENATE DIMEGLUMINE 529 MG/ML IV SOLN  Comparison: None.  Findings:  Gray-white matter differentiation is normal. The sella appears normal.  There is mild heterogeneous signal of the inferior aspect of the clivus.  Cerebellar tonsils are above the level of the foramen magnum.  The odontoid process, the predental  space and the prevertebral soft tissues are within normal limits.  The subcutaneous tissues of the scalp demonstrate focal nonenhancing hypointensity areas over the parietal occipital region, of unknown significance.  No diffusion weighted abnormalities seen.  Axial FLAIR and T2-weighted images demonstrate prominence of sulci overlying cerebral convexities with overlying prominence of the subarachnoid space left greater right.  A few scattered foci of signal hyperintensity are seen in the subcortical white matter bifrontally, the centrum semi ovale, the biparietal subcortical white matter and the right paramedian pons . There is  asymmetry of the right paramedian pons anteriorly.  The ventricles are normal.  Mastoids are clear.  The internal auditory canals are symmetrical in signal and morphology.  Flow voids are maintained in the major vessels at the cranial skull base with tortuosity of the basilar artery in the prepontine cistern in contact with the right paramedian pons.  Moderate inflammatory thickening of the mucosa in   the ethmoid air cells, the maxillary sinuses  and the frontal sinuses is  seen. Visualized orbital contents are grossly normal.  No abnormal blood breakdown products seen on SPGR sequences.  No pathological intracranial enhancement seen. Dural venous sinuses are grossly patent.  IMPRESSION: 1.  No evidence of acute ischemia, or of  pathological intracranial enhancement.  2.  Nonspecific subcortical white matter changes supra and infratentorially.  These probably represent areas of gliosis related to chronic microvascular ischemia.  3.  Ectasia of the basilar artery and mass effect on the right paramedian pons anteriorly 4. Diffuse atrophy. 5.  Inflammatory reaction of the mucosa in the paranasal sinuses.  Original Report Authenticated By: Oneal Grout, M.D.   Nm Pet Image Initial (pi) Skull Base To Thigh  08/03/2011  *RADIOLOGY REPORT*  Clinical Data:  Initial treatment strategy  for right upper lobe mass.  NUCLEAR MEDICINE PET CT INITIAL (PI) SKULL BASE TO THIGH  Technique:  19.6 mCi F-18 FDG was injected intravenously via the right antecubital fossa.  Full-ring PET imaging was performed from the skull base through the mid-thighs 63  minutes after injection. CT data was obtained and used for attenuation correction and anatomic localization only.  (This was not acquired as a diagnostic CT examination.)  Fasting Blood Glucose:  98  Patient Weight:  208 pounds.  Comparison:  CT chest 07/20/2011.  Findings: An irregular mass in the right upper lobe measures 3.2 x 2.7 cm, with an S U V max of 25.6.  And its lateral prevascular lymph node measures 1.8 cm in short axis, with an S U V max of 14.4.  A low right paratracheal lymph node measures 2 cm in short axis, with an S U V max of 23.9.  There is faint uptake in the left hilum, with an S U V max of 3.9.  On diagnostic CT examination performed in November, there is an unenlarged lymph node in the left hilum.  A 12 mm AP window lymph node does not show hypermetabolism. A 10 mm nodule along the minor fissure does not show hypermetabolism.  No additional areas of abnormal hypermetabolism in the neck, chest, abdomen or pelvis.  CT images were performed for attenuation correction.  No acute findings in the neck.  Coronary artery calcification.  No pericardial or pleural effusion.  Liver is somewhat irregular along its margin.  Small stones and/or renal vascular calcifications are seen in the kidneys.  There is an infrarenal aortic aneurysm, measuring 5.1 x 5.0 cm. Left common iliac artery measures 4.2 cm and right common iliac artery measures 2.8 cm.  Prostate indents the bladder base.  Small right inguinal hernia contains fat.  IMPRESSION:  1.  Right upper lobe mass with ipsilateral mediastinal adenopathy, most consistent with primary bronchogenic carcinoma.  If this is a non-small cell lung cancer, it is most consistent with T2aN2M0 or stage IIIA  disease. Note is made that this is down staged from examination performed 07/20/2011. 2.  Very mild hypermetabolism in an unenlarged left hilar lymph node.  Given the intense hypermetabolism of right mediastinal adenopathy, metastatic involvement of the left hilum would be expected to yield a much higher SUV max as well. 3.  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 4.  Coronary artery calcification. 5.  Cirrhosis.  Original Report Authenticated By: Reyes Ivan, M.D.   Dg Chest Port 1 View  08/10/2011  *RADIOLOGY REPORT*  Clinical Data: Lung cancer.  PORTABLE CHEST - 1 VIEW  Comparison: Chest x-ray 08/10/2011.  Findings: Left subclavian Port-A-Cath in good position with its tip in the mid SVC.  No complicating features are demonstrated.  Stable emphysematous changes and right upper lobe mass.  IMPRESSION: Left subclavian Port-A-Cath and good position without complicating features.  Original Report Authenticated By: P. Loralie Champagne, M.D.   Dg Chest Port 1 View  08/05/2011  *RADIOLOGY REPORT*  Clinical Data: Right bronchoscopy.  PORTABLE CHEST - 1 VIEW  Comparison: 08/04/2011  Findings: Stable right upper lobe lung mass.  No pneumothorax following bronchoscopy.  Underlying COPD changes.  Bibasilar atelectasis or infiltrates, increased since prior study, possibly related to bronchoscopy.  Heart is normal size.  IMPRESSION: No pneumothorax following bronchoscopy.  COPD.  Increasing bibasilar opacities.  Original Report Authenticated By: Cyndie Chime, M.D.   Dg Fluoro Guide Cv Line-no Report  08/10/2011  CLINICAL DATA: needed for port-a-cath placement   FLOURO GUIDE CV LINE  Fluoroscopy was utilized by the requesting physician.  No radiographic  interpretation.     Dg C-arm 1-60 Min-no Report  08/05/2011  CLINICAL DATA: needed for case Brochial Navaigation   C-ARM 1-60 MINUTES  Fluoroscopy was utilized by the requesting physician.  No  radiographic  interpretation.  ASSESSMENT/PLAN: The patient is a very pleasant 71 year-old white male recently diagnosed with stage IIIa/IIIB non-small cell lung cancer. The patient was discussed with Dr. Arbutus Ped. He will proceed with his weekly chemotherapy with carboplatin for an AUC of 2 and paclitaxel at 45 mg per meter square. He'll return in 2 weeks for another symptom management visit with a repeat CBC differential and C. met. We will monitor the soft tissue prominence on his upper sternum.    Conni Slipper, PA-C     All questions were answered. The patient knows to call the clinic with any problems, questions or concerns. We can certainly see the patient much sooner if necessary.

## 2011-09-23 ENCOUNTER — Ambulatory Visit
Admission: RE | Admit: 2011-09-23 | Discharge: 2011-09-23 | Disposition: A | Discharge status: Home / Self Care | Payer: Medicare Other | Source: Ambulatory Visit | Attending: Radiation Oncology | Admitting: Radiation Oncology

## 2011-09-24 ENCOUNTER — Ambulatory Visit
Admission: RE | Admit: 2011-09-24 | Discharge: 2011-09-24 | Disposition: A | Discharge status: Home / Self Care | Payer: Medicare Other | Source: Ambulatory Visit | Attending: Radiation Oncology | Admitting: Radiation Oncology

## 2011-09-24 VITALS — BP 129/85 | HR 94 | Temp 97.7°F | Wt 214.0 lb

## 2011-09-24 DIAGNOSIS — C3410 Malignant neoplasm of upper lobe, unspecified bronchus or lung: Secondary | ICD-10-CM

## 2011-09-24 NOTE — Progress Notes (Signed)
DIAGNOSIS:  Lung cancer.  NARRATIVE:  Mr. Joseph [name] presented for simulation for his upcoming course of thoracic radiation treatment.  The patient was placed in a supine position and his arms were raised above him using a customized winged board device.  This simple treatment device will be used on a daily basis during his course of treatment.  In this fashion, a CT scan was obtained through the chest region and an isocenter was placed within the lungs.  Mr. Joseph [name] will initially receive a course of radiation corresponding to 42 Gy at 2 Gy per fraction using a 3D conformal technique.  For the patient's treatment, a total of 3 customized blocks have been designed.  The gross tumor volume has been contoured in addition to the spinal cord, brachial plexus, esophagus, heart, and lungs.  The dose-volume histograms of each of these structures will be reviewed as part of the treatment planning process.  A 3D conformal plan is necessary to ensure that the gross tumor volume receives an adequate dose of radiation and that the critical nearby normal structures are adequately spared which are in close proximity.  Image guidance has also been ordered for the patient's treatment to ensure accurate localization of the target on a daily basis.  Mr. Joseph [name] will receive a course of concurrent chemotherapy during his radiation.  He, therefore, may experience increased or overlapping toxicity and he will be monitored closely for such problems including extra lab work as necessary.  This, therefore, constitutes a special treatment procedure.    ______________________________ Radene Gunning, M.D., Ph.D. JSM/MEDQ  D:  09/24/2011  T:  09/24/2011  Job:  [id number]

## 2011-09-24 NOTE — Progress Notes (Signed)
[facility] Radiation Oncology Weekly Treatment Note    Name: Joseph [name] Date: 09/24/2011 MRN: [id number] DOB: 1941/07/01  Status: outpatient    Current dose: 3200  Current fraction:16  Planned dose:6600  Planned fraction:33   MEDICATIONS: Current Outpatient Prescriptions  Medication Sig Dispense Refill  . HYDROcodone-acetaminophen (NORCO) 5-325 MG per tablet       . lidocaine-prilocaine (EMLA) cream Apply 1 application topically as needed.        . Misc Natural Products (PROSTATE HEALTH) CAPS Take by mouth.        . Multiple Vitamins-Minerals (MULTIVITAMINS THER. W/MINERALS) TABS Take 1 tablet by mouth daily.        Marland Kitchen nystatin-triamcinolone (MYCOLOG II) cream Apply 1 application topically daily as needed.       . pravastatin (PRAVACHOL) 40 MG tablet Take 40 mg by mouth daily.       Marland Kitchen RAPAFLO 8 MG CAPS capsule Take 8 mg by mouth daily with breakfast.       . SYNTHROID 175 MCG tablet Take 175 mcg by mouth daily.       . celecoxib (CELEBREX) 200 MG capsule Take 200 mg by mouth 2 (two) times daily as needed.          ALLERGIES: Review of patient's allergies indicates no known allergies.   LABORATORY DATA:  Lab Results  Component Value Date   WBC 4.8 09/21/2011   HGB 15.4 09/21/2011   HCT 43.2 09/21/2011   MCV 91.3 09/21/2011   PLT 152 09/21/2011   Lab Results  Component Value Date   NA 138 09/21/2011   K 4.2 09/21/2011   CL 107 09/21/2011   CO2 23 09/21/2011   Lab Results  Component Value Date   ALT 24 09/21/2011   AST 26 09/21/2011   ALKPHOS 115 09/21/2011   BILITOT 1.0 09/21/2011      NARRATIVE: Joseph [name] was seen today for weekly treatment management. The chart was checked and CBCT images were reviewed. Some esophagitis now - worsened over the past week.  PHYSICAL EXAMINATION: weight is 214 lb (97.07 kg). His temperature is 97.7 F (36.5 C). His blood pressure is 129/85 and his pulse is 94.      Minimal skin change  ASSESSMENT:  Patient tolerating treatments well. Beginning to have some irritation in esophagus.   PLAN: Continue treatment as planned. Gave script for carafate, and pt to start nexium.

## 2011-09-24 NOTE — Progress Notes (Signed)
Pt. Here for weekly md visit for radiation treatments to lungs.tolerating well . Has just started experiencing some tightness down esophagus on swallowing.Also has question about raised area on mid chest approximate size of a dime. Will inform Dr.Moody.

## 2011-09-25 ENCOUNTER — Ambulatory Visit
Admission: RE | Admit: 2011-09-25 | Discharge: 2011-09-25 | Disposition: A | Discharge status: Home / Self Care | Payer: Medicare Other | Source: Ambulatory Visit | Attending: Radiation Oncology | Admitting: Radiation Oncology

## 2011-09-25 DIAGNOSIS — C3410 Malignant neoplasm of upper lobe, unspecified bronchus or lung: Secondary | ICD-10-CM

## 2011-09-28 ENCOUNTER — Ambulatory Visit (HOSPITAL_BASED_OUTPATIENT_CLINIC_OR_DEPARTMENT_OTHER): Payer: Medicare Other

## 2011-09-28 ENCOUNTER — Ambulatory Visit
Admission: RE | Admit: 2011-09-28 | Discharge: 2011-09-28 | Disposition: A | Discharge status: Home / Self Care | Payer: Medicare Other | Source: Ambulatory Visit | Attending: Radiation Oncology | Admitting: Radiation Oncology

## 2011-09-28 ENCOUNTER — Other Ambulatory Visit (HOSPITAL_BASED_OUTPATIENT_CLINIC_OR_DEPARTMENT_OTHER): Payer: Medicare Other | Admitting: Lab

## 2011-09-28 VITALS — BP 123/80 | HR 88 | Temp 97.1°F

## 2011-09-28 DIAGNOSIS — C3410 Malignant neoplasm of upper lobe, unspecified bronchus or lung: Secondary | ICD-10-CM

## 2011-09-28 DIAGNOSIS — Z5111 Encounter for antineoplastic chemotherapy: Secondary | ICD-10-CM

## 2011-09-28 LAB — COMPREHENSIVE METABOLIC PANEL
ALT: 22 U/L (ref 0–53)
AST: 30 U/L (ref 0–37)
CO2: 23 mEq/L (ref 19–32)
Chloride: 108 mEq/L (ref 96–112)
Sodium: 139 mEq/L (ref 135–145)
Total Bilirubin: 0.9 mg/dL (ref 0.3–1.2)
Total Protein: 6.4 g/dL (ref 6.0–8.3)

## 2011-09-28 LAB — CBC WITH DIFFERENTIAL/PLATELET
BASO%: 0.3 % (ref 0.0–2.0)
LYMPH%: 28.5 % (ref 14.0–49.0)
MCHC: 35.5 g/dL (ref 32.0–36.0)
MONO#: 0.4 10*3/uL (ref 0.1–0.9)
RBC: 4.16 10*6/uL — ABNORMAL LOW (ref 4.20–5.82)
WBC: 3.8 10*3/uL — ABNORMAL LOW (ref 4.0–10.3)
lymph#: 1.1 10*3/uL (ref 0.9–3.3)

## 2011-09-28 MED ORDER — DEXAMETHASONE SODIUM PHOSPHATE 4 MG/ML IJ SOLN
20.0000 mg | Freq: Once | INTRAMUSCULAR | Status: AC
  Administered 2011-09-28: 20 mg via INTRAVENOUS

## 2011-09-28 MED ORDER — FAMOTIDINE IN NACL 20-0.9 MG/50ML-% IV SOLN
20.0000 mg | Freq: Once | INTRAVENOUS | Status: AC
  Administered 2011-09-28: 20 mg via INTRAVENOUS

## 2011-09-28 MED ORDER — DIPHENHYDRAMINE HCL 50 MG/ML IJ SOLN
50.0000 mg | Freq: Once | INTRAMUSCULAR | Status: AC
  Administered 2011-09-28: 50 mg via INTRAVENOUS

## 2011-09-28 MED ORDER — SODIUM CHLORIDE 0.9 % IJ SOLN
10.0000 mL | INTRAMUSCULAR | Status: DC | PRN
  Administered 2011-09-28: 10 mL
  Filled 2011-09-28: qty 10

## 2011-09-28 MED ORDER — ONDANSETRON 16 MG/50ML IVPB (CHCC)
16.0000 mg | Freq: Once | INTRAVENOUS | Status: AC
  Administered 2011-09-28: 16 mg via INTRAVENOUS

## 2011-09-28 MED ORDER — SODIUM CHLORIDE 0.9 % IV SOLN
Freq: Once | INTRAVENOUS | Status: AC
  Administered 2011-09-28: 13:00:00 via INTRAVENOUS

## 2011-09-28 MED ORDER — SODIUM CHLORIDE 0.9 % IV SOLN
300.0000 mg | Freq: Once | INTRAVENOUS | Status: AC
  Administered 2011-09-28: 300 mg via INTRAVENOUS
  Filled 2011-09-28: qty 30

## 2011-09-28 MED ORDER — PACLITAXEL CHEMO INJECTION 300 MG/50ML
45.0000 mg/m2 | Freq: Once | INTRAVENOUS | Status: AC
  Administered 2011-09-28: 96 mg via INTRAVENOUS
  Filled 2011-09-28: qty 16

## 2011-09-28 MED ORDER — HEPARIN SOD (PORK) LOCK FLUSH 100 UNIT/ML IV SOLN
500.0000 [IU] | Freq: Once | INTRAVENOUS | Status: AC | PRN
  Administered 2011-09-28: 500 [IU]
  Filled 2011-09-28: qty 5

## 2011-09-28 NOTE — Patient Instructions (Signed)
Patient tolerated treatment well, vss, pt understands concerns to call md for. D.c ambulatory.vss

## 2011-09-28 NOTE — Progress Notes (Signed)
Verbal verification that patient can be treated today with platlet count of 99. DS

## 2011-09-29 ENCOUNTER — Ambulatory Visit
Admission: RE | Admit: 2011-09-29 | Discharge: 2011-09-29 | Disposition: A | Discharge status: Home / Self Care | Payer: Medicare Other | Source: Ambulatory Visit | Attending: Radiation Oncology | Admitting: Radiation Oncology

## 2011-09-30 ENCOUNTER — Ambulatory Visit
Admission: RE | Admit: 2011-09-30 | Discharge: 2011-09-30 | Disposition: A | Discharge status: Home / Self Care | Payer: Medicare Other | Source: Ambulatory Visit | Attending: Radiation Oncology | Admitting: Radiation Oncology

## 2011-10-01 ENCOUNTER — Ambulatory Visit
Admission: RE | Admit: 2011-10-01 | Discharge: 2011-10-01 | Disposition: A | Discharge status: Home / Self Care | Payer: Medicare Other | Source: Ambulatory Visit | Attending: Radiation Oncology | Admitting: Radiation Oncology

## 2011-10-02 ENCOUNTER — Ambulatory Visit
Admission: RE | Admit: 2011-10-02 | Discharge: 2011-10-02 | Disposition: A | Discharge status: Home / Self Care | Payer: Medicare Other | Source: Ambulatory Visit | Attending: Radiation Oncology | Admitting: Radiation Oncology

## 2011-10-02 ENCOUNTER — Encounter: Payer: Self-pay | Admitting: Radiation Oncology

## 2011-10-02 VITALS — Wt 217.5 lb

## 2011-10-02 DIAGNOSIS — C3410 Malignant neoplasm of upper lobe, unspecified bronchus or lung: Secondary | ICD-10-CM

## 2011-10-02 NOTE — Progress Notes (Signed)
Pt on unknown  antibiotic for gum infection; states med was okayed by med onc dr.  [name] states he cont to have some blood in mucus when he blows his nose, mostly in mornings but now occurs during day occass as well. Nares are  dry, pt using moisturizing  nasal spray. Pt's platelet count low this week.  Reminded pt that if bleeding becomes very freq or constant and he cannot control, he needs to go to ED. Pt verbalized understanding.

## 2011-10-04 ENCOUNTER — Other Ambulatory Visit: Payer: Self-pay | Admitting: Internal Medicine

## 2011-10-04 NOTE — Progress Notes (Signed)
[facility] Radiation Oncology Weekly Treatment Note    Name: Joseph [name] Date: 10/04/2011 MRN: [id number] DOB: 12/16/1940  Status: outpatient    Current dose: 4400  Current fraction:22  Planned dose:6600  Planned fraction:33   MEDICATIONS: Current Outpatient Prescriptions  Medication Sig Dispense Refill  . celecoxib (CELEBREX) 200 MG capsule Take 200 mg by mouth 2 (two) times daily as needed.       Marland Kitchen HYDROcodone-acetaminophen (NORCO) 5-325 MG per tablet       . lidocaine-prilocaine (EMLA) cream Apply 1 application topically as needed.        . Misc Natural Products (PROSTATE HEALTH) CAPS Take by mouth.        . Multiple Vitamins-Minerals (MULTIVITAMINS THER. W/MINERALS) TABS Take 1 tablet by mouth daily.        Marland Kitchen nystatin-triamcinolone (MYCOLOG II) cream Apply 1 application topically daily as needed.       . pravastatin (PRAVACHOL) 40 MG tablet Take 40 mg by mouth daily.       Marland Kitchen RAPAFLO 8 MG CAPS capsule Take 8 mg by mouth daily with breakfast.       . SYNTHROID 175 MCG tablet Take 175 mcg by mouth daily.          ALLERGIES: Review of patient's allergies indicates no known allergies.   LABORATORY DATA:  Lab Results  Component Value Date   WBC 3.8* 09/28/2011   HGB 13.5 09/28/2011   HCT 38.0* 09/28/2011   MCV 91.3 09/28/2011   PLT 99* 09/28/2011   Lab Results  Component Value Date   NA 139 09/28/2011   K 4.1 09/28/2011   CL 108 09/28/2011   CO2 23 09/28/2011   Lab Results  Component Value Date   ALT 22 09/28/2011   AST 30 09/28/2011   ALKPHOS 119* 09/28/2011   BILITOT 0.9 09/28/2011      NARRATIVE: Joseph [name] was seen today for weekly treatment management. The chart was checked and CBCT images were reviewed. Doing well. Occasional blood with mucus when blows nose. Using nasal spray. Platelets mildly/ moderately low.  PHYSICAL EXAMINATION: weight is 217 lb 8 oz (98.657 kg).       ASSESSMENT: Patient tolerating treatments well.     PLAN: Continue treatment as planned.

## 2011-10-05 ENCOUNTER — Ambulatory Visit (HOSPITAL_BASED_OUTPATIENT_CLINIC_OR_DEPARTMENT_OTHER): Payer: Medicare Other | Admitting: Physician Assistant

## 2011-10-05 ENCOUNTER — Encounter: Payer: Self-pay | Admitting: Physician Assistant

## 2011-10-05 ENCOUNTER — Other Ambulatory Visit: Payer: Medicare Other | Admitting: Lab

## 2011-10-05 ENCOUNTER — Encounter: Payer: Self-pay | Admitting: *Deleted

## 2011-10-05 ENCOUNTER — Ambulatory Visit
Admission: RE | Admit: 2011-10-05 | Discharge: 2011-10-05 | Disposition: A | Discharge status: Home / Self Care | Payer: Medicare Other | Source: Ambulatory Visit | Attending: Radiation Oncology | Admitting: Radiation Oncology

## 2011-10-05 ENCOUNTER — Ambulatory Visit (HOSPITAL_BASED_OUTPATIENT_CLINIC_OR_DEPARTMENT_OTHER): Payer: Medicare Other

## 2011-10-05 ENCOUNTER — Telehealth: Payer: Self-pay | Admitting: Internal Medicine

## 2011-10-05 VITALS — BP 133/89 | HR 91 | Temp 97.0°F | Ht 72.0 in | Wt 216.1 lb

## 2011-10-05 DIAGNOSIS — Z5111 Encounter for antineoplastic chemotherapy: Secondary | ICD-10-CM

## 2011-10-05 DIAGNOSIS — C3410 Malignant neoplasm of upper lobe, unspecified bronchus or lung: Secondary | ICD-10-CM

## 2011-10-05 LAB — CBC WITH DIFFERENTIAL/PLATELET
Basophils Absolute: 0 10*3/uL (ref 0.0–0.1)
EOS%: 0.9 % (ref 0.0–7.0)
HCT: 37.7 % — ABNORMAL LOW (ref 38.4–49.9)
HGB: 13.5 g/dL (ref 13.0–17.1)
MCH: 33.8 pg — ABNORMAL HIGH (ref 27.2–33.4)
MONO#: 0.2 10*3/uL (ref 0.1–0.9)
NEUT#: 1.8 10*3/uL (ref 1.5–6.5)
RDW: 14.2 % (ref 11.0–14.6)
WBC: 2.7 10*3/uL — ABNORMAL LOW (ref 4.0–10.3)
lymph#: 0.7 10*3/uL — ABNORMAL LOW (ref 0.9–3.3)

## 2011-10-05 LAB — COMPREHENSIVE METABOLIC PANEL
ALT: 19 U/L (ref 0–53)
AST: 24 U/L (ref 0–37)
Albumin: 4 g/dL (ref 3.5–5.2)
BUN: 18 mg/dL (ref 6–23)
CO2: 20 mEq/L (ref 19–32)
Calcium: 8.6 mg/dL (ref 8.4–10.5)
Chloride: 108 mEq/L (ref 96–112)
Potassium: 4.3 mEq/L (ref 3.5–5.3)

## 2011-10-05 MED ORDER — DIPHENHYDRAMINE HCL 50 MG/ML IJ SOLN
50.0000 mg | Freq: Once | INTRAMUSCULAR | Status: AC
  Administered 2011-10-05: 50 mg via INTRAVENOUS

## 2011-10-05 MED ORDER — SODIUM CHLORIDE 0.9 % IV SOLN
300.0000 mg | Freq: Once | INTRAVENOUS | Status: AC
  Administered 2011-10-05: 300 mg via INTRAVENOUS
  Filled 2011-10-05: qty 30

## 2011-10-05 MED ORDER — FAMOTIDINE IN NACL 20-0.9 MG/50ML-% IV SOLN
20.0000 mg | Freq: Once | INTRAVENOUS | Status: AC
  Administered 2011-10-05: 20 mg via INTRAVENOUS

## 2011-10-05 MED ORDER — ONDANSETRON 16 MG/50ML IVPB (CHCC)
16.0000 mg | Freq: Once | INTRAVENOUS | Status: AC
  Administered 2011-10-05: 16 mg via INTRAVENOUS

## 2011-10-05 MED ORDER — PACLITAXEL CHEMO INJECTION 300 MG/50ML
45.0000 mg/m2 | Freq: Once | INTRAVENOUS | Status: AC
  Administered 2011-10-05: 96 mg via INTRAVENOUS
  Filled 2011-10-05: qty 16

## 2011-10-05 MED ORDER — SODIUM CHLORIDE 0.9 % IV SOLN
Freq: Once | INTRAVENOUS | Status: AC
  Administered 2011-10-05: 10:00:00 via INTRAVENOUS

## 2011-10-05 MED ORDER — DEXAMETHASONE SODIUM PHOSPHATE 4 MG/ML IJ SOLN
20.0000 mg | Freq: Once | INTRAMUSCULAR | Status: AC
  Administered 2011-10-05: 20 mg via INTRAVENOUS

## 2011-10-05 NOTE — Telephone Encounter (Signed)
Gv pt appt for [id number] °

## 2011-10-05 NOTE — Progress Notes (Signed)
No images are attached to the encounter. No scans are attached to the encounter. No scans are attached to the encounter.  [facility] OFFICE PROGRESS NOTE  [name], MD, MD [address] [place] [address]  DIAGNOSIS: Stage IIIa/IIIB non-small cell lung cancer  PRIOR THERAPY: None  CURRENT THERAPY: Concurrent chemoradiation with chemotherapy in the form of weekly carboplatin for an AUC of 2 and paclitaxel at 45 mg per meter squared  INTERVAL HISTORY: Joseph [name] 71 y.o. male returns for a scheduled regular office visit for followup of his stage III A./IIIB non-small cell lung cancer. Overall he is tolerating his concurrent chemoradiation without difficulty with the exception of some increased fatigue. He also notes some shortness of breath with exertion. Lately when he blows his nose there's been some blood mixed in mucus but he has not had any Frank episodes of epistaxis. He brings in some information about a blackberry supplement that he is interested in taking.   MEDICAL HISTORY: Past Medical History  Diagnosis Date  . History of colon polyps   . 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 Hyperlipidemia     takes Pravastatin  . Emphysema   . Lung mass     right upper lobe  . Shortness of breath     with exertion  . Cancer     lung  . Loose stools   . Enlarged prostate     takes Rapaflo daily  . Nocturia   . Arthritis     left knee and back arthrtis  . Hypothyroidism     takes Synthroid daily  . Cataract     ALLERGIES:   has no known allergies.  MEDICATIONS:  Current Outpatient Prescriptions  Medication Sig Dispense Refill  . esomeprazole (NEXIUM) 40 MG capsule Take 40 mg by mouth daily before breakfast.      . sucralfate (CARAFATE) 1 G tablet Take 1 g by mouth 4 (four) times daily.      . celecoxib (CELEBREX) 200 MG capsule Take 200 mg by mouth 2 (two) times daily as needed.       Marland Kitchen HYDROcodone-acetaminophen (NORCO) 5-325 MG per tablet       .  lidocaine-prilocaine (EMLA) cream Apply 1 application topically as needed.        . Misc Natural Products (PROSTATE HEALTH) CAPS Take by mouth.        . Multiple Vitamins-Minerals (MULTIVITAMINS THER. W/MINERALS) TABS Take 1 tablet by mouth daily.        Marland Kitchen nystatin-triamcinolone (MYCOLOG II) cream Apply 1 application topically daily as needed.       . pravastatin (PRAVACHOL) 40 MG tablet Take 40 mg by mouth daily.       Marland Kitchen RAPAFLO 8 MG CAPS capsule Take 8 mg by mouth daily with breakfast.       . SYNTHROID 175 MCG tablet Take 175 mcg by mouth daily.        No current facility-administered medications for this visit.   Facility-Administered Medications Ordered in Other Visits  Medication Dose Route Frequency Provider Last Rate Last Dose  . 0.9 %  sodium chloride infusion   Intravenous Once Mohamed K. Mohamed, MD      . CARBOplatin (PARAPLATIN) 300 mg in sodium chloride 0.9 % 100 mL chemo infusion  300 mg Intravenous Once Mohamed K. Mohamed, MD      . dexamethasone (DECADRON) injection 20 mg  20 mg Intravenous Once Mohamed K. Arbutus Ped, MD      .  diphenhydrAMINE (BENADRYL) injection 50 mg  50 mg Intravenous Once Mohamed K. Mohamed, MD      . famotidine (PEPCID) IVPB 20 mg  20 mg Intravenous Once Mohamed K. Mohamed, MD      . ondansetron (ZOFRAN) IVPB 16 mg  16 mg Intravenous Once Mohamed K. Mohamed, MD      . PACLitaxel (TAXOL) 96 mg in dextrose 5 % 250 mL chemo infusion (</= 80mg /m2)  45 mg/m2 (Treatment Plan Actual) Intravenous Once Mohamed K. Arbutus Ped, MD        SURGICAL HISTORY:  Past Surgical History  Procedure Date  . Hernia repair 70's  . Vasectomy 70's  . Finger surgery 2008    left pointer finger  . Knee arthroscopy 2008    left knee  . Tonsillectomy     as a child  . Colonoscopy 2008  . Cardiac catheterization 2003  . Esophagogastroduodenoscopy   . Portacath placement 08/10/2011    Procedure: INSERTION PORT-A-CATH;  Surgeon: Norton Blizzard, MD;  Location: [facility] OR;  Service:  Thoracic;  Laterality: Left;  Left Subclavian    REVIEW OF SYSTEMS:  A comprehensive review of systems was negative except for: Constitutional: positive for fatigue Ears, nose, mouth, throat, and face: positive for Small amounts of blood mixed in with the mucus when he blows his nose, primarily first thing in the morning. Respiratory: positive for dyspnea on exertion   PHYSICAL EXAMINATION: General appearance: alert, cooperative and no distress Head: Normocephalic, without obvious abnormality, atraumatic Neck: no adenopathy, no carotid bruit, no JVD, supple, symmetrical, trachea midline and thyroid not enlarged, symmetric, no tenderness/mass/nodules Lymph nodes: Cervical, supraclavicular, and axillary nodes normal. Resp: clear to auscultation bilaterally Cardio: regular rate and rhythm, S1, S2 normal, no murmur, click, rub or gallop GI: soft, non-tender; bowel sounds normal; no masses,  no organomegaly Extremities: extremities normal, atraumatic, no cyanosis or edema Neurologic: Alert and oriented X 3, normal strength and tone. Normal symmetric reflexes. Normal coordination and gait   ECOG PERFORMANCE STATUS: 1 - Symptomatic but completely ambulatory  Blood pressure 133/89, pulse 91, temperature 97 F (36.1 C), temperature source Oral, height 6' (1.829 m), weight 216 lb 1.6 oz (98.022 kg).  LABORATORY DATA: Lab Results  Component Value Date   WBC 2.7* 10/05/2011   HGB 13.5 10/05/2011   HCT 37.7* 10/05/2011   MCV 94.7 10/05/2011   PLT 104* 10/05/2011      Chemistry      Component Value Date/Time   NA 139 09/28/2011 1234   K 4.1 09/28/2011 1234   CL 108 09/28/2011 1234   CO2 23 09/28/2011 1234   BUN 15 09/28/2011 1234   CREATININE 0.63 09/28/2011 1234   CREATININE 0.71 07/30/2011 1640      Component Value Date/Time   CALCIUM 8.2* 09/28/2011 1234   ALKPHOS 119* 09/28/2011 1234   AST 30 09/28/2011 1234   ALT 22 09/28/2011 1234   BILITOT 0.9 09/28/2011 1234       RADIOGRAPHIC  STUDIES:  Dg Chest 2 View Within Previous 72 Hours.  Films Obtained On Friday Are Acceptable For Monday And Tuesday Cases  08/10/2011  *RADIOLOGY REPORT*  Clinical Data: Port-A-Cath insertion.  New diagnosis of lung cancer.  CHEST - 2 VIEW  Comparison: Plain film of 08/04/2009.  P E T of 08/02/2009.  Findings: Underlying COPD. Midline trachea.  Normal heart size. Mediastinal adenopathy described on CT and PET is not readily apparent on plain film. No pleural effusion or pneumothorax.  Right upper lobe lung  mass.  Interstitial thickening in the lower lobes is unchanged.  No evidence of acute superimposed process.  IMPRESSION: Right upper lobe lung mass, without evidence of superimposed process.  Original Report Authenticated By: Consuello Bossier, M.D.   Dg Chest 2 View Within Previous 72 Hours.  Films Obtained On Friday Are Acceptable For Monday And Tuesday Cases  08/04/2011  *RADIOLOGY REPORT*  Clinical Data: Preop bronchoscopy  CHEST - 2 VIEW  Comparison: 08/02/2010  Findings: Heart size appears normal.  No pleural effusion or pulmonary edema identified.  There is a mass within the right upper lobe which measures 3.30 x 4.6 cm.  No acute airspace consolidation.  IMPRESSION:  1.  Right upper lobe lung mass. 2.  Emphysema.  Original Report Authenticated By: Rosealee Albee, M.D.   Mr Laqueta Jean ZO Contrast  08/05/2011  *RADIOLOGY REPORT*  Clinical Data:   Dizziness.  History of right upper lobe  mass. Evaluate for metastatic disease.  MRI HEAD WITHOUT AND WITH CONTRAST  Technique:  Multiplanar, multiecho pulse sequences of the brain and surrounding structures were obtained according to standard protocol without and with intravenous contrast  Contrast: 10mL MULTIHANCE GADOBENATE DIMEGLUMINE 529 MG/ML IV SOLN  Comparison: None.  Findings:  Gray-white matter differentiation is normal. The sella appears normal.  There is mild heterogeneous signal of the inferior aspect of the clivus.  Cerebellar tonsils are above  the level of the foramen magnum.  The odontoid process, the predental space and the prevertebral soft tissues are within normal limits.  The subcutaneous tissues of the scalp demonstrate focal nonenhancing hypointensity areas over the parietal occipital region, of unknown significance.  No diffusion weighted abnormalities seen.  Axial FLAIR and T2-weighted images demonstrate prominence of sulci overlying cerebral convexities with overlying prominence of the subarachnoid space left greater right.  A few scattered foci of signal hyperintensity are seen in the subcortical white matter bifrontally, the centrum semi ovale, the biparietal subcortical white matter and the right paramedian pons . There is  asymmetry of the right paramedian pons anteriorly.  The ventricles are normal.  Mastoids are clear.  The internal auditory canals are symmetrical in signal and morphology.  Flow voids are maintained in the major vessels at the cranial skull base with tortuosity of the basilar artery in the prepontine cistern in contact with the right paramedian pons.  Moderate inflammatory thickening of the mucosa in   the ethmoid air cells, the maxillary sinuses  and the frontal sinuses is  seen. Visualized orbital contents are grossly normal.  No abnormal blood breakdown products seen on SPGR sequences.  No pathological intracranial enhancement seen. Dural venous sinuses are grossly patent.  IMPRESSION: 1.  No evidence of acute ischemia, or of  pathological intracranial enhancement.  2.  Nonspecific subcortical white matter changes supra and infratentorially.  These probably represent areas of gliosis related to chronic microvascular ischemia.  3.  Ectasia of the basilar artery and mass effect on the right paramedian pons anteriorly 4. Diffuse atrophy. 5.  Inflammatory reaction of the mucosa in the paranasal sinuses.  Original Report Authenticated By: Oneal Grout, M.D.   Nm Pet Image Initial (pi) Skull Base To  Thigh  08/03/2011  *RADIOLOGY REPORT*  Clinical Data:  Initial treatment strategy for right upper lobe mass.  NUCLEAR MEDICINE PET CT INITIAL (PI) SKULL BASE TO THIGH  Technique:  19.6 mCi F-18 FDG was injected intravenously via the right antecubital fossa.  Full-ring PET imaging was performed from the skull base through  the mid-thighs 63  minutes after injection. CT data was obtained and used for attenuation correction and anatomic localization only.  (This was not acquired as a diagnostic CT examination.)  Fasting Blood Glucose:  98  Patient Weight:  208 pounds.  Comparison:  CT chest 07/20/2011.  Findings: An irregular mass in the right upper lobe measures 3.2 x 2.7 cm, with an S U V max of 25.6.  And its lateral prevascular lymph node measures 1.8 cm in short axis, with an S U V max of 14.4.  A low right paratracheal lymph node measures 2 cm in short axis, with an S U V max of 23.9.  There is faint uptake in the left hilum, with an S U V max of 3.9.  On diagnostic CT examination performed in November, there is an unenlarged lymph node in the left hilum.  A 12 mm AP window lymph node does not show hypermetabolism. A 10 mm nodule along the minor fissure does not show hypermetabolism.  No additional areas of abnormal hypermetabolism in the neck, chest, abdomen or pelvis.  CT images were performed for attenuation correction.  No acute findings in the neck.  Coronary artery calcification.  No pericardial or pleural effusion.  Liver is somewhat irregular along its margin.  Small stones and/or renal vascular calcifications are seen in the kidneys.  There is an infrarenal aortic aneurysm, measuring 5.1 x 5.0 cm. Left common iliac artery measures 4.2 cm and right common iliac artery measures 2.8 cm.  Prostate indents the bladder base.  Small right inguinal hernia contains fat.  IMPRESSION:  1.  Right upper lobe mass with ipsilateral mediastinal adenopathy, most consistent with primary bronchogenic carcinoma.  If this is  a non-small cell lung cancer, it is most consistent with T2aN2M0 or stage IIIA disease. Note is made that this is down staged from examination performed 07/20/2011. 2.  Very mild hypermetabolism in an unenlarged left hilar lymph node.  Given the intense hypermetabolism of right mediastinal adenopathy, metastatic involvement of the left hilum would be expected to yield a much higher SUV max as well. 3.  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 4.  Coronary artery calcification. 5.  Cirrhosis.  Original Report Authenticated By: Reyes Ivan, M.D.   Dg Chest Port 1 View  08/10/2011  *RADIOLOGY REPORT*  Clinical Data: Lung cancer.  PORTABLE CHEST - 1 VIEW  Comparison: Chest x-ray 08/10/2011.  Findings: Left subclavian Port-A-Cath in good position with its tip in the mid SVC.  No complicating features are demonstrated.  Stable emphysematous changes and right upper lobe mass.  IMPRESSION: Left subclavian Port-A-Cath and good position without complicating features.  Original Report Authenticated By: P. Loralie Champagne, M.D.   Dg Chest Port 1 View  08/05/2011  *RADIOLOGY REPORT*  Clinical Data: Right bronchoscopy.  PORTABLE CHEST - 1 VIEW  Comparison: 08/04/2011  Findings: Stable right upper lobe lung mass.  No pneumothorax following bronchoscopy.  Underlying COPD changes.  Bibasilar atelectasis or infiltrates, increased since prior study, possibly related to bronchoscopy.  Heart is normal size.  IMPRESSION: No pneumothorax following bronchoscopy.  COPD.  Increasing bibasilar opacities.  Original Report Authenticated By: Cyndie Chime, M.D.   Dg Fluoro Guide Cv Line-no Report  08/10/2011  CLINICAL DATA: needed for port-a-cath placement   FLOURO GUIDE CV LINE  Fluoroscopy was utilized by the requesting physician.  No radiographic  interpretation.     Dg C-arm 1-60 Min-no Report  08/05/2011  CLINICAL DATA: needed for case Brochial Navaigation   C-ARM  1-60 MINUTES  Fluoroscopy was utilized by the requesting physician.  No radiographic  interpretation.       ASSESSMENT/PLAN: The patient is a very pleasant 71 year-old white male recently diagnosed with stage IIIa/IIIB non-small cell lung cancer. The patient was discussed with Dr. Arbutus Ped. He will proceed with his weekly chemotherapy with carboplatin for an AUC of 2 and paclitaxel at 45 mg per meter square. He'll return in 2 weeks for another symptom management visit with a repeat CBC differential and C. met. He was advised not to start in the supplements while taking chemotherapy. The patient voiced understanding.   Laural Benes, Dalon Reichart E, PA-C     All questions were answered. The patient knows to call the clinic with any problems, questions or concerns. We can certainly see the patient much sooner if necessary.

## 2011-10-06 ENCOUNTER — Ambulatory Visit
Admission: RE | Admit: 2011-10-06 | Discharge: 2011-10-06 | Disposition: A | Discharge status: Home / Self Care | Payer: Medicare Other | Source: Ambulatory Visit | Attending: Radiation Oncology | Admitting: Radiation Oncology

## 2011-10-07 ENCOUNTER — Ambulatory Visit
Admission: RE | Admit: 2011-10-07 | Discharge: 2011-10-07 | Disposition: A | Discharge status: Home / Self Care | Payer: Medicare Other | Source: Ambulatory Visit | Attending: Radiation Oncology | Admitting: Radiation Oncology

## 2011-10-08 ENCOUNTER — Ambulatory Visit
Admission: RE | Admit: 2011-10-08 | Discharge: 2011-10-08 | Disposition: A | Discharge status: Home / Self Care | Payer: Medicare Other | Source: Ambulatory Visit | Attending: Radiation Oncology | Admitting: Radiation Oncology

## 2011-10-08 ENCOUNTER — Encounter: Payer: Self-pay | Admitting: Radiation Oncology

## 2011-10-08 VITALS — Wt 217.2 lb

## 2011-10-08 DIAGNOSIS — C3410 Malignant neoplasm of upper lobe, unspecified bronchus or lung: Secondary | ICD-10-CM

## 2011-10-08 NOTE — Progress Notes (Signed)
[facility] Radiation Oncology Weekly Treatment Note    Name: KOUROSH JABLONSKY Date: 10/08/2011 MRN: [id number] DOB: May 02, 1941  Status: outpatient    Current dose: 5200  Current fraction:26  Planned dose:6600  Planned fraction:33   MEDICATIONS: Current Outpatient Prescriptions  Medication Sig Dispense Refill  . celecoxib (CELEBREX) 200 MG capsule Take 200 mg by mouth 2 (two) times daily as needed.       Marland Kitchen esomeprazole (NEXIUM) 40 MG capsule Take 40 mg by mouth daily before breakfast.      . HYDROcodone-acetaminophen (NORCO) 5-325 MG per tablet       . lidocaine-prilocaine (EMLA) cream Apply 1 application topically as needed.        . Misc Natural Products (PROSTATE HEALTH) CAPS Take by mouth.        . Multiple Vitamins-Minerals (MULTIVITAMINS THER. W/MINERALS) TABS Take 1 tablet by mouth daily.        Marland Kitchen nystatin-triamcinolone (MYCOLOG II) cream Apply 1 application topically daily as needed.       . pravastatin (PRAVACHOL) 40 MG tablet Take 40 mg by mouth daily.       Marland Kitchen RAPAFLO 8 MG CAPS capsule Take 8 mg by mouth daily with breakfast.       . sucralfate (CARAFATE) 1 G tablet Take 1 g by mouth 4 (four) times daily.      Marland Kitchen SYNTHROID 175 MCG tablet Take 175 mcg by mouth daily.          ALLERGIES: Review of patient's allergies indicates no known allergies.   LABORATORY DATA:  Lab Results  Component Value Date   WBC 2.7* 10/05/2011   HGB 13.5 10/05/2011   HCT 37.7* 10/05/2011   MCV 94.7 10/05/2011   PLT 104* 10/05/2011   Lab Results  Component Value Date   NA 139 10/05/2011   K 4.3 10/05/2011   CL 108 10/05/2011   CO2 20 10/05/2011   Lab Results  Component Value Date   ALT 19 10/05/2011   AST 24 10/05/2011   ALKPHOS 111 10/05/2011   BILITOT 0.8 10/05/2011      NARRATIVE: Nolon Bussing Mackowski was seen today for weekly treatment management. The chart was checked and CBCT images were reviewed. Pt is doing well. No pain in chest, no SOB.  PHYSICAL EXAMINATION: weight is  217 lb 3.2 oz (98.521 kg).     ASSESSMENT: Patient tolerating treatments well.    PLAN: Continue treatment as planned.

## 2011-10-08 NOTE — Progress Notes (Signed)
Still reports "blood in mucus when he blows nose in mornings".  Appetite comes and goes. Denies pain or cough.

## 2011-10-09 ENCOUNTER — Ambulatory Visit
Admission: RE | Admit: 2011-10-09 | Discharge: 2011-10-09 | Disposition: A | Discharge status: Home / Self Care | Payer: Medicare Other | Source: Ambulatory Visit | Attending: Radiation Oncology | Admitting: Radiation Oncology

## 2011-10-12 ENCOUNTER — Other Ambulatory Visit: Payer: Self-pay | Admitting: Internal Medicine

## 2011-10-12 ENCOUNTER — Ambulatory Visit
Admission: RE | Admit: 2011-10-12 | Discharge: 2011-10-12 | Disposition: A | Discharge status: Home / Self Care | Payer: Medicare Other | Source: Ambulatory Visit | Attending: Radiation Oncology | Admitting: Radiation Oncology

## 2011-10-12 ENCOUNTER — Other Ambulatory Visit (HOSPITAL_BASED_OUTPATIENT_CLINIC_OR_DEPARTMENT_OTHER): Payer: Medicare Other | Admitting: Lab

## 2011-10-12 ENCOUNTER — Ambulatory Visit (HOSPITAL_BASED_OUTPATIENT_CLINIC_OR_DEPARTMENT_OTHER): Payer: Medicare Other

## 2011-10-12 VITALS — BP 116/79 | HR 96 | Temp 97.7°F

## 2011-10-12 DIAGNOSIS — C3410 Malignant neoplasm of upper lobe, unspecified bronchus or lung: Secondary | ICD-10-CM

## 2011-10-12 DIAGNOSIS — Z5111 Encounter for antineoplastic chemotherapy: Secondary | ICD-10-CM

## 2011-10-12 LAB — CBC WITH DIFFERENTIAL/PLATELET
Basophils Absolute: 0 10*3/uL (ref 0.0–0.1)
EOS%: 0.8 % (ref 0.0–7.0)
Eosinophils Absolute: 0 10*3/uL (ref 0.0–0.5)
HCT: 35.5 % — ABNORMAL LOW (ref 38.4–49.9)
HGB: 12.8 g/dL — ABNORMAL LOW (ref 13.0–17.1)
MCH: 32.8 pg (ref 27.2–33.4)
MCV: 91 fL (ref 79.3–98.0)
MONO%: 9.5 % (ref 0.0–14.0)
NEUT#: 1.2 10*3/uL — ABNORMAL LOW (ref 1.5–6.5)
NEUT%: 50.7 % (ref 39.0–75.0)
lymph#: 0.9 10*3/uL (ref 0.9–3.3)

## 2011-10-12 LAB — COMPREHENSIVE METABOLIC PANEL
Albumin: 3.6 g/dL (ref 3.5–5.2)
Alkaline Phosphatase: 115 U/L (ref 39–117)
BUN: 14 mg/dL (ref 6–23)
CO2: 22 mEq/L (ref 19–32)
Glucose, Bld: 98 mg/dL (ref 70–99)
Potassium: 4.1 mEq/L (ref 3.5–5.3)
Sodium: 139 mEq/L (ref 135–145)
Total Protein: 6.2 g/dL (ref 6.0–8.3)

## 2011-10-12 MED ORDER — PACLITAXEL CHEMO INJECTION 300 MG/50ML
45.0000 mg/m2 | Freq: Once | INTRAVENOUS | Status: AC
  Administered 2011-10-12: 96 mg via INTRAVENOUS
  Filled 2011-10-12: qty 16

## 2011-10-12 MED ORDER — ONDANSETRON 16 MG/50ML IVPB (CHCC)
16.0000 mg | Freq: Once | INTRAVENOUS | Status: AC
  Administered 2011-10-12: 16 mg via INTRAVENOUS

## 2011-10-12 MED ORDER — FAMOTIDINE IN NACL 20-0.9 MG/50ML-% IV SOLN
20.0000 mg | Freq: Once | INTRAVENOUS | Status: AC
  Administered 2011-10-12: 20 mg via INTRAVENOUS

## 2011-10-12 MED ORDER — HEPARIN SOD (PORK) LOCK FLUSH 100 UNIT/ML IV SOLN
500.0000 [IU] | Freq: Once | INTRAVENOUS | Status: AC | PRN
  Administered 2011-10-12: 500 [IU]
  Filled 2011-10-12: qty 5

## 2011-10-12 MED ORDER — DIPHENHYDRAMINE HCL 50 MG/ML IJ SOLN
50.0000 mg | Freq: Once | INTRAMUSCULAR | Status: AC
  Administered 2011-10-12: 50 mg via INTRAVENOUS

## 2011-10-12 MED ORDER — SODIUM CHLORIDE 0.9 % IV SOLN
300.0000 mg | Freq: Once | INTRAVENOUS | Status: AC
  Administered 2011-10-12: 300 mg via INTRAVENOUS
  Filled 2011-10-12: qty 30

## 2011-10-12 MED ORDER — DEXAMETHASONE SODIUM PHOSPHATE 4 MG/ML IJ SOLN
20.0000 mg | Freq: Once | INTRAMUSCULAR | Status: AC
  Administered 2011-10-12: 20 mg via INTRAVENOUS

## 2011-10-12 MED ORDER — SODIUM CHLORIDE 0.9 % IV SOLN
Freq: Once | INTRAVENOUS | Status: AC
  Administered 2011-10-12: 14:00:00 via INTRAVENOUS

## 2011-10-12 MED ORDER — SODIUM CHLORIDE 0.9 % IJ SOLN
10.0000 mL | INTRAMUSCULAR | Status: DC | PRN
  Administered 2011-10-12: 10 mL
  Filled 2011-10-12: qty 10

## 2011-10-12 NOTE — Progress Notes (Signed)
1355 WBC 2.4 ANC 1.2, OK to treat per Dr. Arbutus Ped.

## 2011-10-13 ENCOUNTER — Ambulatory Visit
Admission: RE | Admit: 2011-10-13 | Discharge: 2011-10-13 | Disposition: A | Discharge status: Home / Self Care | Payer: Medicare Other | Source: Ambulatory Visit | Attending: Radiation Oncology | Admitting: Radiation Oncology

## 2011-10-13 DIAGNOSIS — C3410 Malignant neoplasm of upper lobe, unspecified bronchus or lung: Secondary | ICD-10-CM

## 2011-10-14 ENCOUNTER — Ambulatory Visit
Admission: RE | Admit: 2011-10-14 | Discharge: 2011-10-14 | Disposition: A | Discharge status: Home / Self Care | Payer: Medicare Other | Source: Ambulatory Visit | Attending: Radiation Oncology | Admitting: Radiation Oncology

## 2011-10-15 ENCOUNTER — Ambulatory Visit
Admission: RE | Admit: 2011-10-15 | Discharge: 2011-10-15 | Disposition: A | Discharge status: Home / Self Care | Payer: Medicare Other | Source: Ambulatory Visit | Attending: Radiation Oncology | Admitting: Radiation Oncology

## 2011-10-15 DIAGNOSIS — C3410 Malignant neoplasm of upper lobe, unspecified bronchus or lung: Secondary | ICD-10-CM

## 2011-10-15 MED ORDER — RADIAPLEXRX EX GEL
Freq: Once | CUTANEOUS | Status: AC
  Administered 2011-10-15: 17:00:00 via TOPICAL

## 2011-10-16 ENCOUNTER — Ambulatory Visit
Admission: RE | Admit: 2011-10-16 | Discharge: 2011-10-16 | Disposition: A | Discharge status: Home / Self Care | Payer: Medicare Other | Source: Ambulatory Visit | Attending: Radiation Oncology | Admitting: Radiation Oncology

## 2011-10-16 VITALS — BP 115/83 | HR 125 | Temp 97.7°F | Wt 207.8 lb

## 2011-10-16 DIAGNOSIS — C3410 Malignant neoplasm of upper lobe, unspecified bronchus or lung: Secondary | ICD-10-CM

## 2011-10-16 NOTE — Progress Notes (Signed)
Here for routine weekly under treat visit with md for radiation treatments of lung. Will complete radiation treatment tomorrow. Completed chemotherapy on Monday 10/12/2011. Had episode of diarrhea on yesterday resolved with taking imodium ad. Has shortness of breath on ambulation and exertion

## 2011-10-19 ENCOUNTER — Encounter: Payer: Self-pay | Admitting: Physician Assistant

## 2011-10-19 ENCOUNTER — Telehealth: Payer: Self-pay | Admitting: Internal Medicine

## 2011-10-19 ENCOUNTER — Other Ambulatory Visit: Payer: Medicare Other | Admitting: Lab

## 2011-10-19 ENCOUNTER — Ambulatory Visit (HOSPITAL_BASED_OUTPATIENT_CLINIC_OR_DEPARTMENT_OTHER): Payer: Medicare Other | Admitting: Physician Assistant

## 2011-10-19 ENCOUNTER — Ambulatory Visit
Admission: RE | Admit: 2011-10-19 | Discharge: 2011-10-19 | Disposition: A | Discharge status: Home / Self Care | Payer: Medicare Other | Source: Ambulatory Visit | Attending: Radiation Oncology | Admitting: Radiation Oncology

## 2011-10-19 VITALS — BP 132/73 | HR 119 | Temp 97.6°F | Ht 72.0 in | Wt 215.7 lb

## 2011-10-19 DIAGNOSIS — C3410 Malignant neoplasm of upper lobe, unspecified bronchus or lung: Secondary | ICD-10-CM

## 2011-10-19 LAB — COMPREHENSIVE METABOLIC PANEL
AST: 25 U/L (ref 0–37)
Albumin: 3.7 g/dL (ref 3.5–5.2)
Alkaline Phosphatase: 112 U/L (ref 39–117)
BUN: 14 mg/dL (ref 6–23)
Calcium: 9 mg/dL (ref 8.4–10.5)
Creatinine, Ser: 0.81 mg/dL (ref 0.50–1.35)
Glucose, Bld: 103 mg/dL — ABNORMAL HIGH (ref 70–99)

## 2011-10-19 LAB — CBC WITH DIFFERENTIAL/PLATELET
Basophils Absolute: 0 10*3/uL (ref 0.0–0.1)
EOS%: 0.4 % (ref 0.0–7.0)
Eosinophils Absolute: 0 10*3/uL (ref 0.0–0.5)
HCT: 36.7 % — ABNORMAL LOW (ref 38.4–49.9)
HGB: 13.2 g/dL (ref 13.0–17.1)
LYMPH%: 33.9 % (ref 14.0–49.0)
MCH: 34.3 pg — ABNORMAL HIGH (ref 27.2–33.4)
MCV: 95.6 fL (ref 79.3–98.0)
MONO%: 13.5 % (ref 0.0–14.0)
NEUT#: 1.5 10*3/uL (ref 1.5–6.5)
NEUT%: 51.7 % (ref 39.0–75.0)
Platelets: 203 10*3/uL (ref 140–400)

## 2011-10-19 NOTE — Telephone Encounter (Signed)
appt made and printed for 3/19 3/20    aom

## 2011-10-20 NOTE — Progress Notes (Signed)
No images are attached to the encounter. No scans are attached to the encounter. No scans are attached to the encounter. Joseph [name] OFFICE PROGRESS NOTE  [name], MD, MD [address] [place] [address]  DIAGNOSIS: Stage IIIa/IIIB non-small cell lung cancer  PRIOR THERAPY: None  CURRENT THERAPY: Concurrent chemoradiation with chemotherapy in the form of weekly carboplatin for an AUC of 2 and paclitaxel at 45 mg per meter squared  INTERVAL HISTORY: Joseph [name] 71 y.o. male returns accompanied by his wife for a scheduled regular office visit for followup of his stage III A./IIIB non-small cell lung cancer. Overall he has tolerated his concurrent chemoradiation without difficulty with the exception of some increased fatigue. He also notes some shortness of breath with exertion. He said some mild epigastric pressure but reports that he had held his Nexium to do to episode of diarrhea that he had. The diarrhea resolved with Imodium. Both he and his wife report that he is not drinking enough water on a daily basis. He did note a little dizziness but had no syncopal or near syncopal episodes. He voices no other complaints today.    MEDICAL HISTORY: Past Medical History  Diagnosis Date  . History of colon polyps   . 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 Hyperlipidemia     takes Pravastatin  . Emphysema   . Lung mass     right upper lobe  . Shortness of breath     with exertion  . Cancer     lung  . Loose stools   . Enlarged prostate     takes Rapaflo daily  . Nocturia   . Arthritis     left knee and back arthrtis  . Hypothyroidism     takes Synthroid daily  . Cataract     ALLERGIES:   has no known allergies.  MEDICATIONS:  Current Outpatient Prescriptions  Medication Sig Dispense Refill  . celecoxib (CELEBREX) 200 MG capsule Take 200 mg by mouth 2 (two) times daily as needed.       Marland Kitchen esomeprazole (NEXIUM) 40 MG capsule Take 40 mg by mouth daily  before breakfast.      . HYDROcodone-acetaminophen (NORCO) 5-325 MG per tablet       . lidocaine-prilocaine (EMLA) cream Apply 1 application topically as needed.        . loperamide (IMODIUM A-D) 2 MG tablet Take 2 mg by mouth 4 (four) times daily as needed.      . Misc Natural Products (PROSTATE HEALTH) CAPS Take by mouth.        . Multiple Vitamins-Minerals (MULTIVITAMINS THER. W/MINERALS) TABS Take 1 tablet by mouth daily.        Marland Kitchen nystatin-triamcinolone (MYCOLOG II) cream Apply 1 application topically daily as needed.       . pravastatin (PRAVACHOL) 40 MG tablet Take 40 mg by mouth daily.       Marland Kitchen RAPAFLO 8 MG CAPS capsule Take 8 mg by mouth daily with breakfast.       . sucralfate (CARAFATE) 1 G tablet Take 1 g by mouth 4 (four) times daily.      Marland Kitchen SYNTHROID 175 MCG tablet Take 175 mcg by mouth daily.         SURGICAL HISTORY:  Past Surgical History  Procedure Date  . Hernia repair 70's  . Vasectomy 70's  . Finger surgery 2008    left pointer finger  . Knee arthroscopy 2008  left knee  . Tonsillectomy     as a child  . Colonoscopy 2008  . Cardiac catheterization 2003  . Esophagogastroduodenoscopy   . Portacath placement 08/10/2011    Procedure: INSERTION PORT-A-CATH;  Surgeon: Norton Blizzard, MD;  Location: [facility] OR;  Service: Thoracic;  Laterality: Left;  Left Subclavian    REVIEW OF SYSTEMS:  Pertinent items are noted in HPI.   PHYSICAL EXAMINATION: General appearance: alert, cooperative and no distress Head: Normocephalic, without obvious abnormality, atraumatic Neck: no adenopathy, no carotid bruit, no JVD, supple, symmetrical, trachea midline and thyroid not enlarged, symmetric, no tenderness/mass/nodules Lymph nodes: Cervical, supraclavicular, and axillary nodes normal. Resp: clear to auscultation bilaterally Cardio: regular rate and rhythm, S1, S2 normal, no murmur, click, rub or gallop GI: soft, non-tender; bowel sounds normal; no masses,  no  organomegaly Extremities: extremities normal, atraumatic, no cyanosis or edema Neurologic: Alert and oriented X 3, normal strength and tone. Normal symmetric reflexes. Normal coordination and gait   ECOG PERFORMANCE STATUS: 1 - Symptomatic but completely ambulatory  Blood pressure 132/73, pulse 119, temperature 97.6 F (36.4 C), temperature source Oral, height 6' (1.829 m), weight 215 lb 11.2 oz (97.841 kg).  LABORATORY DATA: Lab Results  Component Value Date   WBC 2.8* 10/19/2011   HGB 13.2 10/19/2011   HCT 36.7* 10/19/2011   MCV 95.6 10/19/2011   PLT 203 10/19/2011      Chemistry      Component Value Date/Time   NA 141 10/19/2011 1437   K 3.8 10/19/2011 1437   CL 106 10/19/2011 1437   CO2 24 10/19/2011 1437   BUN 14 10/19/2011 1437   CREATININE 0.81 10/19/2011 1437   CREATININE 0.71 07/30/2011 1640      Component Value Date/Time   CALCIUM 9.0 10/19/2011 1437   ALKPHOS 112 10/19/2011 1437   AST 25 10/19/2011 1437   ALT 20 10/19/2011 1437   BILITOT 1.3* 10/19/2011 1437       RADIOGRAPHIC STUDIES:  Dg Chest 2 View Within Previous 72 Hours.  Films Obtained On Friday Are Acceptable For Monday And Tuesday Cases  08/10/2011  *RADIOLOGY REPORT*  Clinical Data: Port-A-Cath insertion.  New diagnosis of lung cancer.  CHEST - 2 VIEW  Comparison: Plain film of 08/04/2009.  P E T of 08/02/2009.  Findings: Underlying COPD. Midline trachea.  Normal heart size. Mediastinal adenopathy described on CT and PET is not readily apparent on plain film. No pleural effusion or pneumothorax.  Right upper lobe lung mass.  Interstitial thickening in the lower lobes is unchanged.  No evidence of acute superimposed process.  IMPRESSION: Right upper lobe lung mass, without evidence of superimposed process.  Original Report Authenticated By: Consuello Bossier, M.D.   Dg Chest 2 View Within Previous 72 Hours.  Films Obtained On Friday Are Acceptable For Monday And Tuesday Cases  08/04/2011  *RADIOLOGY REPORT*  Clinical  Data: Preop bronchoscopy  CHEST - 2 VIEW  Comparison: 08/02/2010  Findings: Heart size appears normal.  No pleural effusion or pulmonary edema identified.  There is a mass within the right upper lobe which measures 3.30 x 4.6 cm.  No acute airspace consolidation.  IMPRESSION:  1.  Right upper lobe lung mass. 2.  Emphysema.  Original Report Authenticated By: Rosealee Albee, M.D.   Mr Laqueta Jean WU Contrast  08/05/2011  *RADIOLOGY REPORT*  Clinical Data:   Dizziness.  History of right upper lobe  mass. Evaluate for metastatic disease.  MRI HEAD WITHOUT AND WITH  CONTRAST  Technique:  Multiplanar, multiecho pulse sequences of the brain and surrounding structures were obtained according to standard protocol without and with intravenous contrast  Contrast: 10mL MULTIHANCE GADOBENATE DIMEGLUMINE 529 MG/ML IV SOLN  Comparison: None.  Findings:  Gray-white matter differentiation is normal. The sella appears normal.  There is mild heterogeneous signal of the inferior aspect of the clivus.  Cerebellar tonsils are above the level of the foramen magnum.  The odontoid process, the predental space and the prevertebral soft tissues are within normal limits.  The subcutaneous tissues of the scalp demonstrate focal nonenhancing hypointensity areas over the parietal occipital region, of unknown significance.  No diffusion weighted abnormalities seen.  Axial FLAIR and T2-weighted images demonstrate prominence of sulci overlying cerebral convexities with overlying prominence of the subarachnoid space left greater right.  A few scattered foci of signal hyperintensity are seen in the subcortical white matter bifrontally, the centrum semi ovale, the biparietal subcortical white matter and the right paramedian pons . There is  asymmetry of the right paramedian pons anteriorly.  The ventricles are normal.  Mastoids are clear.  The internal auditory canals are symmetrical in signal and morphology.  Flow voids are maintained in the major  vessels at the cranial skull base with tortuosity of the basilar artery in the prepontine cistern in contact with the right paramedian pons.  Moderate inflammatory thickening of the mucosa in   the ethmoid air cells, the maxillary sinuses  and the frontal sinuses is  seen. Visualized orbital contents are grossly normal.  No abnormal blood breakdown products seen on SPGR sequences.  No pathological intracranial enhancement seen. Dural venous sinuses are grossly patent.  IMPRESSION: 1.  No evidence of acute ischemia, or of  pathological intracranial enhancement.  2.  Nonspecific subcortical white matter changes supra and infratentorially.  These probably represent areas of gliosis related to chronic microvascular ischemia.  3.  Ectasia of the basilar artery and mass effect on the right paramedian pons anteriorly 4. Diffuse atrophy. 5.  Inflammatory reaction of the mucosa in the paranasal sinuses.  Original Report Authenticated By: Oneal Grout, M.D.   Nm Pet Image Initial (pi) Skull Base To Thigh  08/03/2011  *RADIOLOGY REPORT*  Clinical Data:  Initial treatment strategy for right upper lobe mass.  NUCLEAR MEDICINE PET CT INITIAL (PI) SKULL BASE TO THIGH  Technique:  19.6 mCi F-18 FDG was injected intravenously via the right antecubital fossa.  Full-ring PET imaging was performed from the skull base through the mid-thighs 63  minutes after injection. CT data was obtained and used for attenuation correction and anatomic localization only.  (This was not acquired as a diagnostic CT examination.)  Fasting Blood Glucose:  98  Patient Weight:  208 pounds.  Comparison:  CT chest 07/20/2011.  Findings: An irregular mass in the right upper lobe measures 3.2 x 2.7 cm, with an S U V max of 25.6.  And its lateral prevascular lymph node measures 1.8 cm in short axis, with an S U V max of 14.4.  A low right paratracheal lymph node measures 2 cm in short axis, with an S U V max of 23.9.  There is faint uptake in the left  hilum, with an S U V max of 3.9.  On diagnostic CT examination performed in November, there is an unenlarged lymph node in the left hilum.  A 12 mm AP window lymph node does not show hypermetabolism. A 10 mm nodule along the minor fissure does not show  hypermetabolism.  No additional areas of abnormal hypermetabolism in the neck, chest, abdomen or pelvis.  CT images were performed for attenuation correction.  No acute findings in the neck.  Coronary artery calcification.  No pericardial or pleural effusion.  Liver is somewhat irregular along its margin.  Small stones and/or renal vascular calcifications are seen in the kidneys.  There is an infrarenal aortic aneurysm, measuring 5.1 x 5.0 cm. Left common iliac artery measures 4.2 cm and right common iliac artery measures 2.8 cm.  Prostate indents the bladder base.  Small right inguinal hernia contains fat.  IMPRESSION:  1.  Right upper lobe mass with ipsilateral mediastinal adenopathy, most consistent with primary bronchogenic carcinoma.  If this is a non-small cell lung cancer, it is most consistent with T2aN2M0 or stage IIIA disease. Note is made that this is down staged from examination performed 07/20/2011. 2.  Very mild hypermetabolism in an unenlarged left hilar lymph node.  Given the intense hypermetabolism of right mediastinal adenopathy, metastatic involvement of the left hilum would be expected to yield a much higher SUV max as well. 3.  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 4.  Coronary artery calcification. 5.  Cirrhosis.  Original Report Authenticated By: Reyes Ivan, M.D.   Dg Chest Port 1 View  08/10/2011  *RADIOLOGY REPORT*  Clinical Data: Lung cancer.  PORTABLE CHEST - 1 VIEW  Comparison: Chest x-ray 08/10/2011.  Findings: Left subclavian Port-A-Cath in good position with its tip in the mid SVC.  No complicating features are demonstrated.  Stable emphysematous changes and right  upper lobe mass.  IMPRESSION: Left subclavian Port-A-Cath and good position without complicating features.  Original Report Authenticated By: P. Loralie Champagne, M.D.   Dg Chest Port 1 View  08/05/2011  *RADIOLOGY REPORT*  Clinical Data: Right bronchoscopy.  PORTABLE CHEST - 1 VIEW  Comparison: 08/04/2011  Findings: Stable right upper lobe lung mass.  No pneumothorax following bronchoscopy.  Underlying COPD changes.  Bibasilar atelectasis or infiltrates, increased since prior study, possibly related to bronchoscopy.  Heart is normal size.  IMPRESSION: No pneumothorax following bronchoscopy.  COPD.  Increasing bibasilar opacities.  Original Report Authenticated By: Cyndie Chime, M.D.   Dg Fluoro Guide Cv Line-no Report  08/10/2011  CLINICAL DATA: needed for port-a-cath placement   FLOURO GUIDE CV LINE  Fluoroscopy was utilized by the requesting physician.  No radiographic  interpretation.     Dg C-arm 1-60 Min-no Report  08/05/2011  CLINICAL DATA: needed for case Brochial Navaigation   C-ARM 1-60 MINUTES  Fluoroscopy was utilized by the requesting physician.  No radiographic  interpretation.       ASSESSMENT/PLAN: The patient is a very pleasant 71 year-old white male recently diagnosed with stage IIIa/IIIB non-small cell lung cancer. The patient was discussed with Dr. Arbutus Ped. He has completed his course of concurrent chemoradiation. He'll followup with Dr. Gwenyth Bouillon in one month with a repeat CBC differential and CT of the chest with contrast to reevaluate his disease. He is encouraged to increase his intake of water daily. He will followup with Dr. Mitzi Hansen as scheduled in mid March 2013.   Laural Benes, Clare Fennimore E, PA-C     All questions were answered. The patient knows to call the clinic with any problems, questions or concerns. We can certainly see the patient much sooner if necessary.

## 2011-10-22 NOTE — Progress Notes (Signed)
[facility] Radiation Oncology Weekly Treatment Note    Name: PRESSLEY TADESSE Date: 10/22/2011 MRN: [id number] DOB: 03/07/1941  Status: outpatient    Current dose: 6400  Current fraction:32  Planned dose:6600  Planned fraction:33   MEDICATIONS: Current Outpatient Prescriptions  Medication Sig Dispense Refill  . esomeprazole (NEXIUM) 40 MG capsule Take 40 mg by mouth daily before breakfast.      . HYDROcodone-acetaminophen (NORCO) 5-325 MG per tablet       . lidocaine-prilocaine (EMLA) cream Apply 1 application topically as needed.        . loperamide (IMODIUM A-D) 2 MG tablet Take 2 mg by mouth 4 (four) times daily as needed.      . Multiple Vitamins-Minerals (MULTIVITAMINS THER. W/MINERALS) TABS Take 1 tablet by mouth daily.        Marland Kitchen nystatin-triamcinolone (MYCOLOG II) cream Apply 1 application topically daily as needed.       . pravastatin (PRAVACHOL) 40 MG tablet Take 40 mg by mouth daily.       Marland Kitchen RAPAFLO 8 MG CAPS capsule Take 8 mg by mouth daily with breakfast.       . sucralfate (CARAFATE) 1 G tablet Take 1 g by mouth 4 (four) times daily.      Marland Kitchen SYNTHROID 175 MCG tablet Take 175 mcg by mouth daily.       . celecoxib (CELEBREX) 200 MG capsule Take 200 mg by mouth 2 (two) times daily as needed.       . Misc Natural Products (PROSTATE HEALTH) CAPS Take by mouth.           ALLERGIES: Review of patient's allergies indicates no known allergies.   LABORATORY DATA:  Lab Results  Component Value Date   WBC 2.8* 10/19/2011   HGB 13.2 10/19/2011   HCT 36.7* 10/19/2011   MCV 95.6 10/19/2011   PLT 203 10/19/2011   Lab Results  Component Value Date   NA 141 10/19/2011   K 3.8 10/19/2011   CL 106 10/19/2011   CO2 24 10/19/2011   Lab Results  Component Value Date   ALT 20 10/19/2011   AST 25 10/19/2011   ALKPHOS 112 10/19/2011   BILITOT 1.3* 10/19/2011      NARRATIVE: Nolon Bussing Rodden was seen today for weekly treatment management. The chart was checked  and CBCT images were reviewed. Pt doing very well. One episode of diarrhea - resolved.No change esophagitis.  PHYSICAL EXAMINATION: weight is 207 lb 12.8 oz (94.257 kg). His temperature is 97.7 F (36.5 C). His blood pressure is 115/83 and his pulse is 125. His oxygen saturation is 97%.       ASSESSMENT: Patient tolerating treatments well.    PLAN: Continue treatment as planned.

## 2011-10-24 NOTE — Procedures (Signed)
DIAGNOSIS:  Lung cancer.  NARRATIVE:  Mr. Joseph [name] has undergone a simulation for his final cone down boost treatment.  He has been planned thus far to receive 60 Gy and he will now receive an additional 6 Gy in 3 fractions at 2 Gy per fraction.  For this final boost treatment an additional 4 customized blocks have been designed for this purpose, and 6 MV photons will be used.  A complex isodose plan is requested to ensure that the target is adequately covered and that the normal tissues in this region continue to be adequately spared with respect to the boost treatment and the cumulative plan.  This will deliver 66 Gy.  Image guidance is ordered for this boost as it has been for the initial course and the first boost.    ______________________________ Radene Gunning, M.D., Ph.D. JSM/MEDQ  D:  10/24/2011  T:  10/24/2011  Job:  [id number]

## 2011-10-24 NOTE — Progress Notes (Signed)
CC:   Joseph [name], M.D. Joseph [name], M.D.  DIAGNOSIS:  Non-small cell lung cancer, stage III.  INDICATION FOR THERAPY:  Curative.  TREATMENT DATES:  09/02/2011 to 10/19/2011.  SITE AND DOSE:  Joseph [name] received a course of thoracic radiotherapy to the gross disease within the chest.  He initially received a 3D conformal technique to 42 Gy at 2 Gy per fraction.  He then received an initial boost for an additional 18 Gy using a 4 field technique and a final boost treatment for an additional 6 Gy also using a 4 field technique.  This yielded a total dose of 66 Gy.  Both 6 and 10 MV photons were used for his treatment.  NARRATIVE:  Overall Joseph [name] did quite well during the course of his treatment.  He did experience some irritation in terms of some mild skin changes and esophagitis but overall did quite well.  FOLLOWUP APPOINTMENT:  One month.    ______________________________ Radene Gunning, M.D., Ph.D. JSM/MEDQ  D:  10/24/2011  T:  10/24/2011  Job:  [id number]

## 2011-10-24 NOTE — Procedures (Signed)
DIAGNOSIS:  Lung cancer.  NARRATIVE:  Joseph [name] has initially been planned to receive a dose of 42 Gy in 21 fractions using a 3D conformal technique.  He will now receive an additional 18 Gy using a 4-field boost technique.  Therefore an additional 4 customized blocks have been designed for this purpose. A complex isodose plan is requested to ensure that the boost target is adequately covered with radiation and the normal tissues in this region are adequately spared.  This will yield 60 Gy and then it is anticipated that the patient will receive an additional 6 Gy final cone down boost to yield a final total dose of 66 Gy.  Again, an additional 4 customized blocks have been designed for this next boost treatment.    ______________________________ Radene Gunning, M.D., Ph.D. JSM/MEDQ  D:  10/24/2011  T:  10/24/2011  Job:  [id number]

## 2011-11-05 ENCOUNTER — Telehealth: Payer: Self-pay | Admitting: *Deleted

## 2011-11-05 NOTE — Telephone Encounter (Signed)
Pt's wife called stating that pt is still c/o fatigue after radiation and chemotherapy has been completed.  Wanted to know if this was normal.  Informed her that he may experience fatigue for several weeks after chemo/rad and we can reassess him at f/u appt on 11/18/11.  He denies any increase SOB or any other complaints at this time.  Pt's wife verbalized understanding and will call back if anything changes.  SLJ

## 2011-11-17 ENCOUNTER — Encounter: Payer: Self-pay | Admitting: *Deleted

## 2011-11-17 ENCOUNTER — Ambulatory Visit (HOSPITAL_COMMUNITY)
Admission: RE | Admit: 2011-11-17 | Discharge: 2011-11-17 | Disposition: A | Discharge status: Home / Self Care | Payer: Medicare Other | Source: Ambulatory Visit | Attending: Physician Assistant | Admitting: Physician Assistant

## 2011-11-17 DIAGNOSIS — R911 Solitary pulmonary nodule: Secondary | ICD-10-CM | POA: Insufficient documentation

## 2011-11-17 DIAGNOSIS — R0602 Shortness of breath: Secondary | ICD-10-CM | POA: Insufficient documentation

## 2011-11-17 DIAGNOSIS — Z923 Personal history of irradiation: Secondary | ICD-10-CM | POA: Insufficient documentation

## 2011-11-17 DIAGNOSIS — C3410 Malignant neoplasm of upper lobe, unspecified bronchus or lung: Secondary | ICD-10-CM

## 2011-11-17 DIAGNOSIS — C3490 Malignant neoplasm of unspecified part of unspecified bronchus or lung: Secondary | ICD-10-CM | POA: Insufficient documentation

## 2011-11-17 DIAGNOSIS — R599 Enlarged lymph nodes, unspecified: Secondary | ICD-10-CM | POA: Insufficient documentation

## 2011-11-17 DIAGNOSIS — K7460 Unspecified cirrhosis of liver: Secondary | ICD-10-CM | POA: Insufficient documentation

## 2011-11-17 MED ORDER — IOHEXOL 300 MG/ML  SOLN
80.0000 mL | Freq: Once | INTRAMUSCULAR | Status: AC | PRN
  Administered 2011-11-17: 80 mL via INTRAVENOUS

## 2011-11-18 ENCOUNTER — Telehealth: Payer: Self-pay | Admitting: Internal Medicine

## 2011-11-18 ENCOUNTER — Ambulatory Visit (HOSPITAL_BASED_OUTPATIENT_CLINIC_OR_DEPARTMENT_OTHER): Payer: Medicare Other | Admitting: Internal Medicine

## 2011-11-18 ENCOUNTER — Other Ambulatory Visit (HOSPITAL_BASED_OUTPATIENT_CLINIC_OR_DEPARTMENT_OTHER): Payer: Medicare Other | Admitting: Lab

## 2011-11-18 VITALS — BP 139/91 | HR 96 | Temp 97.0°F | Ht 72.0 in | Wt 218.1 lb

## 2011-11-18 DIAGNOSIS — C3490 Malignant neoplasm of unspecified part of unspecified bronchus or lung: Secondary | ICD-10-CM

## 2011-11-18 DIAGNOSIS — C3410 Malignant neoplasm of upper lobe, unspecified bronchus or lung: Secondary | ICD-10-CM

## 2011-11-18 DIAGNOSIS — Z09 Encounter for follow-up examination after completed treatment for conditions other than malignant neoplasm: Secondary | ICD-10-CM

## 2011-11-18 LAB — COMPREHENSIVE METABOLIC PANEL
ALT: 21 U/L (ref 0–53)
AST: 29 U/L (ref 0–37)
Alkaline Phosphatase: 136 U/L — ABNORMAL HIGH (ref 39–117)
CO2: 21 mEq/L (ref 19–32)
Creatinine, Ser: 0.7 mg/dL (ref 0.50–1.35)
Sodium: 142 mEq/L (ref 135–145)
Total Bilirubin: 1 mg/dL (ref 0.3–1.2)
Total Protein: 6.7 g/dL (ref 6.0–8.3)

## 2011-11-18 LAB — CBC WITH DIFFERENTIAL/PLATELET
BASO%: 0.2 % (ref 0.0–2.0)
EOS%: 2.4 % (ref 0.0–7.0)
HCT: 39.7 % (ref 38.4–49.9)
LYMPH%: 27.3 % (ref 14.0–49.0)
MCH: 34.4 pg — ABNORMAL HIGH (ref 27.2–33.4)
MCHC: 34.7 g/dL (ref 32.0–36.0)
MCV: 99.1 fL — ABNORMAL HIGH (ref 79.3–98.0)
MONO#: 0.8 10*3/uL (ref 0.1–0.9)
MONO%: 15.3 % — ABNORMAL HIGH (ref 0.0–14.0)
NEUT%: 54.8 % (ref 39.0–75.0)
Platelets: 133 10*3/uL — ABNORMAL LOW (ref 140–400)
RBC: 4.01 10*6/uL — ABNORMAL LOW (ref 4.20–5.82)
WBC: 5.1 10*3/uL (ref 4.0–10.3)

## 2011-11-18 NOTE — Progress Notes (Signed)
[name] [phone]   [phone]  OFFICE PROGRESS NOTE  [name], MD, MD [address] [place] [address]  DIAGNOSIS: Stage IIIa/IIIB non-small cell lung cancer   PRIOR THERAPY:  Concurrent chemoradiation with chemotherapy in the form of weekly carboplatin for an AUC of 2 and paclitaxel at 45 mg per meter squared  CURRENT THERAPY: The patient will start next week the first cycle of consolidation chemotherapy with carboplatin for AUC of 5 and paclitaxel and 75 mg/M2 every 3 weeks with Neulasta support.  INTERVAL HISTORY: Joseph [name] 71 y.o. male returns to the clinic today for followup visit accompanied by his wife. He tolerated the previous course of concurrent chemoradiation fairly well. The patient denied having any significant chest pain or shortness of breath. He has no nausea or vomiting, fever or chills. No significant weight loss or swallowing issues. He has repeat CT scan of the chest performed recently and he is here today for evaluation and discussion of his scan results.  MEDICAL HISTORY: Past Medical History  Diagnosis Date  . History of colon polyps   . 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 Hyperlipidemia     takes Pravastatin  . Emphysema   . Lung mass     right upper lobe  . Shortness of breath     with exertion  . Cancer     lung  . Loose stools   . Enlarged prostate     takes Rapaflo daily  . Nocturia   . Arthritis     left knee and back arthrtis  . Hypothyroidism     takes Synthroid daily  . Cataract   . Hx of radiation therapy 09/02/11 to 10/19/11    chest    ALLERGIES:   has no known allergies.  MEDICATIONS:  Current Outpatient Prescriptions  Medication Sig Dispense Refill  . celecoxib (CELEBREX) 200 MG capsule Take 200 mg by mouth 2 (two) times daily as needed.       Marland Kitchen HYDROcodone-acetaminophen (NORCO) 5-325 MG per tablet       . lidocaine-prilocaine (EMLA) cream Apply 1 application  topically as needed.        . loperamide (IMODIUM A-D) 2 MG tablet Take 2 mg by mouth 4 (four) times daily as needed.      . Multiple Vitamins-Minerals (MULTIVITAMINS THER. W/MINERALS) TABS Take 1 tablet by mouth daily.        Marland Kitchen nystatin-triamcinolone (MYCOLOG II) cream Apply 1 application topically daily as needed.       Marland Kitchen omeprazole (PRILOSEC) 20 MG capsule Take 20 mg by mouth daily.      . pravastatin (PRAVACHOL) 40 MG tablet Take 40 mg by mouth daily.       . prochlorperazine (COMPAZINE) 10 MG tablet Take 10 mg by mouth every 6 (six) hours as needed.      Marland Kitchen RAPAFLO 8 MG CAPS capsule Take 8 mg by mouth daily with breakfast.       . SYNTHROID 175 MCG tablet Take 175 mcg by mouth daily.       Marland Kitchen esomeprazole (NEXIUM) 40 MG capsule Take 40 mg by mouth daily before breakfast.      . Misc Natural Products (PROSTATE HEALTH) CAPS Take by mouth.        . sucralfate (CARAFATE) 1 G tablet Take 1 g by mouth 4 (four) times daily.        SURGICAL HISTORY:  Past Surgical  History  Procedure Date  . Hernia repair 70's  . Vasectomy 70's  . Finger surgery 2008    left pointer finger  . Knee arthroscopy 2008    left knee  . Tonsillectomy     as a child  . Colonoscopy 2008  . Cardiac catheterization 2003  . Esophagogastroduodenoscopy   . Portacath placement 08/10/2011    Procedure: INSERTION PORT-A-CATH;  Surgeon: Norton Blizzard, MD;  Location: [facility] OR;  Service: Thoracic;  Laterality: Left;  Left Subclavian    REVIEW OF SYSTEMS:  A comprehensive review of systems was negative.   PHYSICAL EXAMINATION: General appearance: alert, cooperative and no distress Head: Normocephalic, without obvious abnormality, atraumatic Neck: no adenopathy Lymph nodes: Cervical, supraclavicular, and axillary nodes normal. Resp: clear to auscultation bilaterally Cardio: regular rate and rhythm, S1, S2 normal, no murmur, click, rub or gallop GI: soft, non-tender; bowel sounds normal; no masses,  no  organomegaly Extremities: extremities normal, atraumatic, no cyanosis or edema Neurologic: Alert and oriented X 3, normal strength and tone. Normal symmetric reflexes. Normal coordination and gait  ECOG PERFORMANCE STATUS: 0 - Asymptomatic  Blood pressure 139/91, pulse 96, temperature 97 F (36.1 C), temperature source Oral, height 6' (1.829 m), weight 218 lb 1.6 oz (98.93 kg).  LABORATORY DATA: Lab Results  Component Value Date   WBC 5.1 11/18/2011   HGB 13.8 11/18/2011   HCT 39.7 11/18/2011   MCV 99.1* 11/18/2011   PLT 133* 11/18/2011      Chemistry      Component Value Date/Time   NA 141 10/19/2011 1437   K 3.8 10/19/2011 1437   CL 106 10/19/2011 1437   CO2 24 10/19/2011 1437   BUN 14 10/19/2011 1437   CREATININE 0.81 10/19/2011 1437   CREATININE 0.71 07/30/2011 1640      Component Value Date/Time   CALCIUM 9.0 10/19/2011 1437   ALKPHOS 112 10/19/2011 1437   AST 25 10/19/2011 1437   ALT 20 10/19/2011 1437   BILITOT 1.3* 10/19/2011 1437       RADIOGRAPHIC STUDIES: Ct Chest W Contrast  11/17/2011  *RADIOLOGY REPORT*  Clinical Data: Lung cancer diagnosed 11/12.  Chemotherapy and radiation therapy completed 4 weeks ago.  Shortness of breath.  CT CHEST WITH CONTRAST  Technique:  Multidetector CT imaging of the chest was performed following the standard protocol during bolus administration of intravenous contrast.  Contrast: 80mL OMNIPAQUE IOHEXOL 300 MG/ML IJ SOLN  Comparison: Plain film of 08/10/2011.  Most recent chest CT of 07/20/2011.  P E T of 08/03/2011.  Findings: Lung windows demonstrate secretions along the nondependent trachea on image 88.  Moderate to severe centrilobular emphysema. Decreased size of a right upper lobe lung nodule/mass.  Today, this measures 1.2 x 1.4 cm on image 21 versus 3.5 x 2.5 cm on 07/20/2011. A nodule along the right minor fissure (inferior right upper lobe) measures 1.0 by 0.7 cm on image 38 versus 1.1 x 0.9 cm on the prior. Clear left lung.  Soft tissue  windows demonstrate a left-sided Port-A-Cath which terminates at the mid SVC.  Tortuous descending thoracic aorta.  Normal heart size with advanced multivessel coronary artery atherosclerosis. No pericardial or pleural effusion.  No central pulmonary embolism, on this non-dedicated study. Low right paratracheal node measures 1.3 cm today versus 1.7 cm on the prior.  Image 30 today. A right-sided prevascular node measures 7 mm on image 29 today versus 1.3 cm on the prior.  The left prevascular/AP window node described on the  prior CT measures 1.0 by 1.2 cm on image 28 versus 1.2 x 1.2 on the prior.  No hilar adenopathy.  Stable small paraesophageal nodes.  9 mm right cardiophrenic angle node measured 8 mm on the prior (when remeasured).  Limited abdominal imaging demonstrates moderate cirrhosis.  No focal liver lesion.  Stable mildly prominent upper abdominal nodes, likely reactive. Normal adrenal glands.  5 mm sclerotic lesion within the upper thoracic spine eccentric left on image 12 is unchanged.  Likely a bone island.  IMPRESSION:  1.  Interval response to therapy of right upper lobe lung nodule/mass and ipsilateral mediastinal adenopathy. 2.  Inferior right upper lobe lung nodule which measures similar to slightly decreased in size today.  This could be incidental or represent response to therapy of pulmonary metastasis versus a synchronous primary. 3.  A left-sided mediastinal node measures similar to minimally decreased in size.  This was felt to not represent nodal metastasis on the prior PET.  This remains indeterminate. 4.  Right cardiophrenic angle node is felt to be similar, but mildly enlarged. Recommend attention on follow-up. 5.  Cirrhosis.  Original Report Authenticated By: Consuello Bossier, M.D.    ASSESSMENT: This is a very pleasant 71 years old white male with stage IIIB non-small cell lung cancer diagnosed in December of 2012 status post a course of concurrent chemoradiation. The patient has  significant improvement in his disease.  PLAN: I discussed the scan results and showed the images to the patient and his wife. I recommended for the duration consolidation chemotherapy with carboplatin for AUC of 5 and paclitaxel 175 mg/M2 with Neulasta support every 3 weeks for a total of 3 cycles. I discussed with the patient adverse effect of this treatment including but not limited to alopecia, myelosuppression, peripheral neuropathy, nausea vomiting, liver or renal dysfunction. The patient agreed to the current plan and he is expected to start the first cycle of this treatment next week. He would come back for followup visit in one month with the start of cycle #2.  All questions were answered. The patient knows to call the clinic with any problems, questions or concerns. We can certainly see the patient much sooner if necessary.  I spent 20 minutes counseling the patient face to face. The total time spent in the appointment was 35 minutes.

## 2011-11-18 NOTE — Telephone Encounter (Signed)
Gv pt appt for [id number] 

## 2011-11-19 ENCOUNTER — Telehealth: Payer: Self-pay | Admitting: Medical Oncology

## 2011-11-19 NOTE — Telephone Encounter (Signed)
confirming appointments-done

## 2011-11-20 ENCOUNTER — Ambulatory Visit
Admission: RE | Admit: 2011-11-20 | Discharge: 2011-11-20 | Disposition: A | Discharge status: Home / Self Care | Payer: Medicare Other | Source: Ambulatory Visit | Attending: Radiation Oncology | Admitting: Radiation Oncology

## 2011-11-20 VITALS — BP 143/97 | HR 100 | Temp 96.9°F | Wt 218.0 lb

## 2011-11-20 DIAGNOSIS — C3410 Malignant neoplasm of upper lobe, unspecified bronchus or lung: Secondary | ICD-10-CM

## 2011-11-20 NOTE — Progress Notes (Signed)
Radiation Oncology         [phone]) [phone] ________________________________  Name: Joseph [name] MRN: [id number]  Date: 11/20/2011  DOB: Nov 30, 1940  Follow-Up Visit Note  CC: [name], MD, MD  Ines Bloomer, MD  Diagnosis:   Non-small cell lung cancer, stage III  Interval Since Last Radiation:  1 months  Narrative:  The patient returns today for routine follow-up.  The patient indicates that he is doing fairly well. The patient does note some increased fatigue. His esophagitis has improved and he is not having major difficulties with this. The patient does note some dyspnea on exertion but denies any shortness of breath at rest. The patient did have a CT scan of the chest completed on 11/17/2011. He had an interval response to therapy which looked quite good on review of the right upper lobe nodule and ipsilateral mediastinal adenopathy. The inferior right upper lobe lung nodule measured similar to slightly decreased in size. The patient has discussed these results with Dr. Arbutus Ped and a course of consolidative chemotherapy has been recommended for him.                              ALLERGIES:   has no known allergies.  Meds: Current Outpatient Prescriptions  Medication Sig Dispense Refill  . celecoxib (CELEBREX) 200 MG capsule Take 200 mg by mouth 2 (two) times daily as needed.       Marland Kitchen esomeprazole (NEXIUM) 40 MG capsule Take 40 mg by mouth daily before breakfast.      . HYDROcodone-acetaminophen (NORCO) 5-325 MG per tablet       . lidocaine-prilocaine (EMLA) cream Apply 1 application topically as needed.        . loperamide (IMODIUM A-D) 2 MG tablet Take 2 mg by mouth 4 (four) times daily as needed.      . Misc Natural Products (PROSTATE HEALTH) CAPS Take by mouth.        . Multiple Vitamins-Minerals (MULTIVITAMINS THER. W/MINERALS) TABS Take 1 tablet by mouth daily.        Marland Kitchen nystatin-triamcinolone (MYCOLOG II) cream Apply 1 application topically daily as needed.         Marland Kitchen omeprazole (PRILOSEC) 20 MG capsule Take 20 mg by mouth daily.      . pravastatin (PRAVACHOL) 40 MG tablet Take 40 mg by mouth daily.       . prochlorperazine (COMPAZINE) 10 MG tablet Take 10 mg by mouth every 6 (six) hours as needed.      Marland Kitchen RAPAFLO 8 MG CAPS capsule Take 8 mg by mouth daily with breakfast.       . sucralfate (CARAFATE) 1 G tablet Take 1 g by mouth 4 (four) times daily.      Marland Kitchen SYNTHROID 175 MCG tablet Take 175 mcg by mouth daily.         Physical Findings: The patient is in no acute distress. Patient is alert and oriented.  weight is 218 lb (98.884 kg). His temperature is 96.9 F (36.1 C). His blood pressure is 143/97 and his pulse is 100. His oxygen saturation is 96%. .  No significant changes.  Lab Findings: Lab Results  Component Value Date   WBC 5.1 11/18/2011   HGB 13.8 11/18/2011   HCT 39.7 11/18/2011   MCV 99.1* 11/18/2011   PLT 133* 11/18/2011    @LASTCHEM @  Radiographic Findings: Ct Chest W Contrast  11/17/2011  *RADIOLOGY REPORT*  Clinical Data: Lung cancer diagnosed 11/12.  Chemotherapy and radiation therapy completed 4 weeks ago.  Shortness of breath.  CT CHEST WITH CONTRAST  Technique:  Multidetector CT imaging of the chest was performed following the standard protocol during bolus administration of intravenous contrast.  Contrast: 80mL OMNIPAQUE IOHEXOL 300 MG/ML IJ SOLN  Comparison: Plain film of 08/10/2011.  Most recent chest CT of 07/20/2011.  P E T of 08/03/2011.  Findings: Lung windows demonstrate secretions along the nondependent trachea on image 88.  Moderate to severe centrilobular emphysema. Decreased size of a right upper lobe lung nodule/mass.  Today, this measures 1.2 x 1.4 cm on image 21 versus 3.5 x 2.5 cm on 07/20/2011. A nodule along the right minor fissure (inferior right upper lobe) measures 1.0 by 0.7 cm on image 38 versus 1.1 x 0.9 cm on the prior. Clear left lung.  Soft tissue windows demonstrate a left-sided Port-A-Cath which terminates  at the mid SVC.  Tortuous descending thoracic aorta.  Normal heart size with advanced multivessel coronary artery atherosclerosis. No pericardial or pleural effusion.  No central pulmonary embolism, on this non-dedicated study. Low right paratracheal node measures 1.3 cm today versus 1.7 cm on the prior.  Image 30 today. A right-sided prevascular node measures 7 mm on image 29 today versus 1.3 cm on the prior.  The left prevascular/AP window node described on the prior CT measures 1.0 by 1.2 cm on image 28 versus 1.2 x 1.2 on the prior.  No hilar adenopathy.  Stable small paraesophageal nodes.  9 mm right cardiophrenic angle node measured 8 mm on the prior (when remeasured).  Limited abdominal imaging demonstrates moderate cirrhosis.  No focal liver lesion.  Stable mildly prominent upper abdominal nodes, likely reactive. Normal adrenal glands.  5 mm sclerotic lesion within the upper thoracic spine eccentric left on image 12 is unchanged.  Likely a bone island.  IMPRESSION:  1.  Interval response to therapy of right upper lobe lung nodule/mass and ipsilateral mediastinal adenopathy. 2.  Inferior right upper lobe lung nodule which measures similar to slightly decreased in size today.  This could be incidental or represent response to therapy of pulmonary metastasis versus a synchronous primary. 3.  A left-sided mediastinal node measures similar to minimally decreased in size.  This was felt to not represent nodal metastasis on the prior PET.  This remains indeterminate. 4.  Right cardiophrenic angle node is felt to be similar, but mildly enlarged. Recommend attention on follow-up. 5.  Cirrhosis.  Original Report Authenticated By: Consuello Bossier, M.D.    Impression:  The patient is recovering from the effects of radiation.  He is doing quite well in this regard. He is not having any active issues regarding his radiation treatment such as esophagitis. The patient is having some shortness of breath and I believe this  is likely multifactorial. Is not complaining of any other symptoms such as fever. The patient's response appeared reasonably good on his CT scan which would be classified as a partial response thus far.  Plan:  The patient is proceeding with consolidative chemotherapy with Dr. Arbutus Ped. I will have him return to our clinic in 6 months for continued followup.   Radene Gunning, M.D., Ph.D.

## 2011-11-20 NOTE — Progress Notes (Signed)
Here for routine radiation follow up of non-small cell lung ca stage III.  Patient feeling more fatigued and short of breath but oxygen saturation 96% on room air. Ct of chest reveals interval response to treatment. Denies pain.Appetite good.

## 2011-11-24 ENCOUNTER — Other Ambulatory Visit: Payer: Medicare Other | Admitting: Lab

## 2011-11-24 ENCOUNTER — Ambulatory Visit (HOSPITAL_BASED_OUTPATIENT_CLINIC_OR_DEPARTMENT_OTHER): Payer: Medicare Other

## 2011-11-24 VITALS — BP 134/90 | HR 105 | Temp 98.5°F

## 2011-11-24 DIAGNOSIS — Z5111 Encounter for antineoplastic chemotherapy: Secondary | ICD-10-CM

## 2011-11-24 DIAGNOSIS — C3410 Malignant neoplasm of upper lobe, unspecified bronchus or lung: Secondary | ICD-10-CM

## 2011-11-24 DIAGNOSIS — C3490 Malignant neoplasm of unspecified part of unspecified bronchus or lung: Secondary | ICD-10-CM

## 2011-11-24 LAB — CBC WITH DIFFERENTIAL/PLATELET
Basophils Absolute: 0 10*3/uL (ref 0.0–0.1)
Eosinophils Absolute: 0.2 10*3/uL (ref 0.0–0.5)
HGB: 13.9 g/dL (ref 13.0–17.1)
LYMPH%: 31.6 % (ref 14.0–49.0)
MONO#: 0.9 10*3/uL (ref 0.1–0.9)
NEUT#: 2.8 10*3/uL (ref 1.5–6.5)
Platelets: 123 10*3/uL — ABNORMAL LOW (ref 140–400)
RBC: 4.11 10*6/uL — ABNORMAL LOW (ref 4.20–5.82)
RDW: 15.4 % — ABNORMAL HIGH (ref 11.0–14.6)
WBC: 5.6 10*3/uL (ref 4.0–10.3)

## 2011-11-24 LAB — COMPREHENSIVE METABOLIC PANEL
Alkaline Phosphatase: 143 U/L — ABNORMAL HIGH (ref 39–117)
BUN: 10 mg/dL (ref 6–23)
CO2: 23 mEq/L (ref 19–32)
Glucose, Bld: 89 mg/dL (ref 70–99)
Sodium: 135 mEq/L (ref 135–145)
Total Bilirubin: 1.4 mg/dL — ABNORMAL HIGH (ref 0.3–1.2)
Total Protein: 6.7 g/dL (ref 6.0–8.3)

## 2011-11-24 MED ORDER — ONDANSETRON 16 MG/50ML IVPB (CHCC)
16.0000 mg | Freq: Once | INTRAVENOUS | Status: AC
  Administered 2011-11-24: 16 mg via INTRAVENOUS

## 2011-11-24 MED ORDER — HEPARIN SOD (PORK) LOCK FLUSH 100 UNIT/ML IV SOLN
500.0000 [IU] | Freq: Once | INTRAVENOUS | Status: AC | PRN
  Administered 2011-11-24: 500 [IU]
  Filled 2011-11-24: qty 5

## 2011-11-24 MED ORDER — SODIUM CHLORIDE 0.9 % IV SOLN
750.0000 mg | Freq: Once | INTRAVENOUS | Status: AC
  Administered 2011-11-24: 750 mg via INTRAVENOUS
  Filled 2011-11-24: qty 75

## 2011-11-24 MED ORDER — PACLITAXEL CHEMO INJECTION 300 MG/50ML
175.0000 mg/m2 | Freq: Once | INTRAVENOUS | Status: AC
  Administered 2011-11-24: 390 mg via INTRAVENOUS
  Filled 2011-11-24: qty 65

## 2011-11-24 MED ORDER — DIPHENHYDRAMINE HCL 50 MG/ML IJ SOLN
50.0000 mg | Freq: Once | INTRAMUSCULAR | Status: AC
  Administered 2011-11-24: 50 mg via INTRAVENOUS

## 2011-11-24 MED ORDER — FAMOTIDINE IN NACL 20-0.9 MG/50ML-% IV SOLN
20.0000 mg | Freq: Once | INTRAVENOUS | Status: AC
  Administered 2011-11-24: 20 mg via INTRAVENOUS

## 2011-11-24 MED ORDER — DEXAMETHASONE SODIUM PHOSPHATE 4 MG/ML IJ SOLN
20.0000 mg | Freq: Once | INTRAMUSCULAR | Status: AC
  Administered 2011-11-24: 20 mg via INTRAVENOUS

## 2011-11-24 MED ORDER — SODIUM CHLORIDE 0.9 % IV SOLN
Freq: Once | INTRAVENOUS | Status: AC
  Administered 2011-11-24: 10:00:00 via INTRAVENOUS

## 2011-11-24 MED ORDER — SODIUM CHLORIDE 0.9 % IJ SOLN
10.0000 mL | INTRAMUSCULAR | Status: DC | PRN
  Administered 2011-11-24: 10 mL
  Filled 2011-11-24: qty 10

## 2011-11-24 NOTE — Patient Instructions (Signed)
[facility] Discharge Instructions for Patients Receiving Chemotherapy  Today you received the following chemotherapy agents Taxol/Carbo.  To help prevent nausea and vomiting after your treatment, we encourage you to take your nausea medication as ordered per MD. .   If you develop nausea and vomiting that is not controlled by your nausea medication, call the clinic. If it is after clinic hours your family physician or the after hours number for the clinic or go to the Emergency Department.   BELOW ARE SYMPTOMS THAT SHOULD BE REPORTED IMMEDIATELY:  *FEVER GREATER THAN 100.5 F  *CHILLS WITH OR WITHOUT FEVER  NAUSEA AND VOMITING THAT IS NOT CONTROLLED WITH YOUR NAUSEA MEDICATION  *UNUSUAL SHORTNESS OF BREATH  *UNUSUAL BRUISING OR BLEEDING  TENDERNESS IN MOUTH AND THROAT WITH OR WITHOUT PRESENCE OF ULCERS  *URINARY PROBLEMS  *BOWEL PROBLEMS  UNUSUAL RASH Items with * indicate a potential emergency and should be followed up as soon as possible.  nt. Please let the nurse know about any problems that you may have experienced. Feel free to call the clinic you have any questions or concerns. The clinic phone number is [phone].   I have been informed and understand all the instructions given to me. I know to contact the clinic, my physician, or go to the Emergency Department if any problems should occur. I do not have any questions at this time, but understand that I may call the clinic during office hours   should I have any questions or need assistance in obtaining follow up care.    __________________________________________  _____________  __________ Signature of Patient or Authorized Representative            Date                   Time    __________________________________________ Nurse's Signature

## 2011-11-25 ENCOUNTER — Ambulatory Visit (HOSPITAL_BASED_OUTPATIENT_CLINIC_OR_DEPARTMENT_OTHER): Payer: Medicare Other

## 2011-11-25 VITALS — BP 137/88 | HR 113 | Temp 96.8°F

## 2011-11-25 DIAGNOSIS — Z5189 Encounter for other specified aftercare: Secondary | ICD-10-CM

## 2011-11-25 DIAGNOSIS — C3410 Malignant neoplasm of upper lobe, unspecified bronchus or lung: Secondary | ICD-10-CM

## 2011-11-25 MED ORDER — PEGFILGRASTIM INJECTION 6 MG/0.6ML
6.0000 mg | Freq: Once | SUBCUTANEOUS | Status: AC
  Administered 2011-11-25: 6 mg via SUBCUTANEOUS

## 2011-12-02 ENCOUNTER — Other Ambulatory Visit (HOSPITAL_BASED_OUTPATIENT_CLINIC_OR_DEPARTMENT_OTHER): Payer: Medicare Other | Admitting: Lab

## 2011-12-02 DIAGNOSIS — C3490 Malignant neoplasm of unspecified part of unspecified bronchus or lung: Secondary | ICD-10-CM

## 2011-12-02 LAB — CBC WITH DIFFERENTIAL/PLATELET
Eosinophils Absolute: 0 10*3/uL (ref 0.0–0.5)
HGB: 13.2 g/dL (ref 13.0–17.1)
MONO#: 0.5 10*3/uL (ref 0.1–0.9)
MONO%: 13.5 % (ref 0.0–14.0)
NEUT#: 2.1 10*3/uL (ref 1.5–6.5)
RBC: 3.83 10*6/uL — ABNORMAL LOW (ref 4.20–5.82)
RDW: 14.7 % — ABNORMAL HIGH (ref 11.0–14.6)
WBC: 4 10*3/uL (ref 4.0–10.3)
lymph#: 1.2 10*3/uL (ref 0.9–3.3)
nRBC: 0 % (ref 0–0)

## 2011-12-02 LAB — COMPREHENSIVE METABOLIC PANEL
Albumin: 3.9 g/dL (ref 3.5–5.2)
BUN: 14 mg/dL (ref 6–23)
CO2: 22 mEq/L (ref 19–32)
Calcium: 9.4 mg/dL (ref 8.4–10.5)
Glucose, Bld: 100 mg/dL — ABNORMAL HIGH (ref 70–99)
Potassium: 4.3 mEq/L (ref 3.5–5.3)
Sodium: 140 mEq/L (ref 135–145)
Total Protein: 7 g/dL (ref 6.0–8.3)

## 2011-12-09 ENCOUNTER — Other Ambulatory Visit (HOSPITAL_BASED_OUTPATIENT_CLINIC_OR_DEPARTMENT_OTHER): Payer: Medicare Other | Admitting: Lab

## 2011-12-09 DIAGNOSIS — C3490 Malignant neoplasm of unspecified part of unspecified bronchus or lung: Secondary | ICD-10-CM

## 2011-12-09 LAB — CBC WITH DIFFERENTIAL/PLATELET
BASO%: 0.3 % (ref 0.0–2.0)
LYMPH%: 20.2 % (ref 14.0–49.0)
MCHC: 34.9 g/dL (ref 32.0–36.0)
MONO#: 0.6 10*3/uL (ref 0.1–0.9)
MONO%: 9.8 % (ref 0.0–14.0)
Platelets: 136 10*3/uL — ABNORMAL LOW (ref 140–400)
RBC: 3.57 10*6/uL — ABNORMAL LOW (ref 4.20–5.82)
WBC: 6.6 10*3/uL (ref 4.0–10.3)
nRBC: 0 % (ref 0–0)

## 2011-12-09 LAB — COMPREHENSIVE METABOLIC PANEL
ALT: 19 U/L (ref 0–53)
Albumin: 3.5 g/dL (ref 3.5–5.2)
CO2: 25 mEq/L (ref 19–32)
Chloride: 106 mEq/L (ref 96–112)
Potassium: 4.1 mEq/L (ref 3.5–5.3)
Sodium: 138 mEq/L (ref 135–145)
Total Bilirubin: 1.3 mg/dL — ABNORMAL HIGH (ref 0.3–1.2)
Total Protein: 6.7 g/dL (ref 6.0–8.3)

## 2011-12-15 ENCOUNTER — Other Ambulatory Visit (HOSPITAL_BASED_OUTPATIENT_CLINIC_OR_DEPARTMENT_OTHER): Payer: Medicare Other | Admitting: Lab

## 2011-12-15 ENCOUNTER — Other Ambulatory Visit: Payer: Self-pay | Admitting: Medical Oncology

## 2011-12-15 ENCOUNTER — Ambulatory Visit: Payer: Medicare Other | Admitting: Internal Medicine

## 2011-12-15 ENCOUNTER — Ambulatory Visit (HOSPITAL_BASED_OUTPATIENT_CLINIC_OR_DEPARTMENT_OTHER): Payer: Medicare Other

## 2011-12-15 VITALS — BP 113/79 | HR 101 | Temp 97.8°F | Wt 216.3 lb

## 2011-12-15 DIAGNOSIS — C3490 Malignant neoplasm of unspecified part of unspecified bronchus or lung: Secondary | ICD-10-CM

## 2011-12-15 DIAGNOSIS — C3410 Malignant neoplasm of upper lobe, unspecified bronchus or lung: Secondary | ICD-10-CM

## 2011-12-15 DIAGNOSIS — R110 Nausea: Secondary | ICD-10-CM

## 2011-12-15 DIAGNOSIS — Z5111 Encounter for antineoplastic chemotherapy: Secondary | ICD-10-CM

## 2011-12-15 LAB — COMPREHENSIVE METABOLIC PANEL
ALT: 23 U/L (ref 0–53)
AST: 33 U/L (ref 0–37)
Chloride: 107 mEq/L (ref 96–112)
Creatinine, Ser: 0.62 mg/dL (ref 0.50–1.35)
Total Bilirubin: 0.6 mg/dL (ref 0.3–1.2)

## 2011-12-15 LAB — CBC WITH DIFFERENTIAL/PLATELET
BASO%: 0.4 % (ref 0.0–2.0)
EOS%: 1 % (ref 0.0–7.0)
HCT: 37.9 % — ABNORMAL LOW (ref 38.4–49.9)
LYMPH%: 35.3 % (ref 14.0–49.0)
MCH: 34.6 pg — ABNORMAL HIGH (ref 27.2–33.4)
MCHC: 34.8 g/dL (ref 32.0–36.0)
NEUT%: 49.5 % (ref 39.0–75.0)
Platelets: 182 10*3/uL (ref 140–400)

## 2011-12-15 MED ORDER — HEPARIN SOD (PORK) LOCK FLUSH 100 UNIT/ML IV SOLN
500.0000 [IU] | Freq: Once | INTRAVENOUS | Status: AC | PRN
  Administered 2011-12-15: 500 [IU]
  Filled 2011-12-15: qty 5

## 2011-12-15 MED ORDER — SODIUM CHLORIDE 0.9 % IJ SOLN
10.0000 mL | INTRAMUSCULAR | Status: DC | PRN
  Administered 2011-12-15: 10 mL
  Filled 2011-12-15: qty 10

## 2011-12-15 MED ORDER — DEXAMETHASONE SODIUM PHOSPHATE 4 MG/ML IJ SOLN
20.0000 mg | Freq: Once | INTRAMUSCULAR | Status: AC
  Administered 2011-12-15: 20 mg via INTRAVENOUS

## 2011-12-15 MED ORDER — PROCHLORPERAZINE MALEATE 10 MG PO TABS: 10.0000 mg | ORAL_TABLET | Freq: Four times a day (QID) | ORAL | Status: DC | PRN

## 2011-12-15 MED ORDER — DIPHENHYDRAMINE HCL 50 MG/ML IJ SOLN
50.0000 mg | Freq: Once | INTRAMUSCULAR | Status: AC
  Administered 2011-12-15: 50 mg via INTRAVENOUS

## 2011-12-15 MED ORDER — ONDANSETRON 16 MG/50ML IVPB (CHCC)
16.0000 mg | Freq: Once | INTRAVENOUS | Status: AC
  Administered 2011-12-15: 16 mg via INTRAVENOUS

## 2011-12-15 MED ORDER — SODIUM CHLORIDE 0.9 % IV SOLN
Freq: Once | INTRAVENOUS | Status: AC
  Administered 2011-12-15: 10:00:00 via INTRAVENOUS

## 2011-12-15 MED ORDER — SODIUM CHLORIDE 0.9 % IV SOLN
750.0000 mg | Freq: Once | INTRAVENOUS | Status: AC
  Administered 2011-12-15: 750 mg via INTRAVENOUS
  Filled 2011-12-15: qty 75

## 2011-12-15 MED ORDER — FAMOTIDINE IN NACL 20-0.9 MG/50ML-% IV SOLN
20.0000 mg | Freq: Once | INTRAVENOUS | Status: AC
  Administered 2011-12-15: 20 mg via INTRAVENOUS

## 2011-12-15 MED ORDER — PACLITAXEL CHEMO INJECTION 300 MG/50ML
175.0000 mg/m2 | Freq: Once | INTRAVENOUS | Status: AC
  Administered 2011-12-15: 390 mg via INTRAVENOUS
  Filled 2011-12-15: qty 65

## 2011-12-15 NOTE — Patient Instructions (Signed)
[facility] Discharge Instructions for Patients Receiving Chemotherapy  Today you received the following chemotherapy agents; Paclitaxel and Carboplatin.  To help prevent nausea and vomiting after your treatment, we encourage you to take your nausea medication per doctor's instructions. Begin taking it as prescribed by your physician.  If you develop nausea and vomiting that is not controlled by your nausea medication, call the clinic. If it is after clinic hours your family physician or the after hours number for the clinic or go to the Emergency Department.   BELOW ARE SYMPTOMS THAT SHOULD BE REPORTED IMMEDIATELY:  *FEVER GREATER THAN 100.5 F  *CHILLS WITH OR WITHOUT FEVER  NAUSEA AND VOMITING THAT IS NOT CONTROLLED WITH YOUR NAUSEA MEDICATION  *UNUSUAL SHORTNESS OF BREATH  *UNUSUAL BRUISING OR BLEEDING  TENDERNESS IN MOUTH AND THROAT WITH OR WITHOUT PRESENCE OF ULCERS  *URINARY PROBLEMS  *BOWEL PROBLEMS  UNUSUAL RASH Items with * indicate a potential emergency and should be followed up as soon as possible.  One of the nurses will contact you 24 hours after your treatment. Please let the nurse know about any problems that you may have experienced. Feel free to call the clinic if you have any questions or concerns. The clinic phone number is [phone].   I have been informed and understand all the instructions given to me. I know to contact the clinic, my physician, or go to the Emergency Department if any problems should occur. I do not have any questions at this time, but understand that I may call the clinic during office hours   should I have any questions or need assistance in obtaining follow up care.    __________________________________________  _____________  __________ Signature of Patient or Authorized Representative            Date                   Time    __________________________________________ Nurse's Signature

## 2011-12-15 NOTE — Telephone Encounter (Signed)
Called in refill for compazine and pt notified

## 2011-12-15 NOTE — Progress Notes (Signed)
[facility] [phone]   [phone]  OFFICE PROGRESS NOTE  [name], MD, MD [address] [place] [address]  DIAGNOSIS: Stage IIIa/IIIB non-small cell lung cancer   PRIOR THERAPY: Concurrent chemoradiation with chemotherapy in the form of weekly carboplatin for an AUC of 2 and paclitaxel at 45 mg per meter squared   CURRENT THERAPY: Consolidation chemotherapy with carboplatin for AUC of 5 and paclitaxel 175 mg/M2 every 3 weeks with Neulasta support, status post 1 cycle.   INTERVAL HISTORY: Joseph [name] 71 y.o. male returns to the clinic today for followup visit accompanied by his wife. The patient tolerated the first cycle of his consolidation chemotherapy fairly well was no significant complaints except for aching pain for 3 days after the Neulasta injection. He denied having any significant nausea or vomiting. He has no fever or chills. No significant weight loss or night sweats. He is to have some shortness of breath with exertion but no significant chest pain, cough or hemoptysis. He is here today to start cycle #2 of his chemotherapy.  MEDICAL HISTORY: Past Medical History  Diagnosis Date  . History of colon polyps   . 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 Hyperlipidemia     takes Pravastatin  . Emphysema   . Lung mass     right upper lobe  . Shortness of breath     with exertion  . Cancer     lung  . Loose stools   . Enlarged prostate     takes Rapaflo daily  . Nocturia   . Arthritis     left knee and back arthrtis  . Hypothyroidism     takes Synthroid daily  . Cataract   . Hx of radiation therapy 09/02/11 to 10/19/11    chest    ALLERGIES:   has no known allergies.  MEDICATIONS:  Current Outpatient Prescriptions  Medication Sig Dispense Refill  . celecoxib (CELEBREX) 200 MG capsule Take 200 mg by mouth 2 (two) times daily as needed.       Marland Kitchen esomeprazole (NEXIUM) 40 MG capsule Take 40 mg by mouth daily before  breakfast.      . HYDROcodone-acetaminophen (NORCO) 5-325 MG per tablet       . lidocaine-prilocaine (EMLA) cream Apply 1 application topically as needed.        . loperamide (IMODIUM A-D) 2 MG tablet Take 2 mg by mouth 4 (four) times daily as needed.      . Misc Natural Products (PROSTATE HEALTH) CAPS Take by mouth.        . Multiple Vitamins-Minerals (MULTIVITAMINS THER. W/MINERALS) TABS Take 1 tablet by mouth daily.        Marland Kitchen nystatin-triamcinolone (MYCOLOG II) cream Apply 1 application topically daily as needed.       Marland Kitchen omeprazole (PRILOSEC) 20 MG capsule Take 20 mg by mouth daily.      . pravastatin (PRAVACHOL) 40 MG tablet Take 40 mg by mouth daily.       . prochlorperazine (COMPAZINE) 10 MG tablet Take 10 mg by mouth every 6 (six) hours as needed.      Marland Kitchen RAPAFLO 8 MG CAPS capsule Take 8 mg by mouth daily with breakfast.       . sucralfate (CARAFATE) 1 G tablet Take 1 g by mouth 4 (four) times daily.      Marland Kitchen SYNTHROID 175 MCG tablet Take 175 mcg by mouth daily.  SURGICAL HISTORY:  Past Surgical History  Procedure Date  . Hernia repair 70's  . Vasectomy 70's  . Finger surgery 2008    left pointer finger  . Knee arthroscopy 2008    left knee  . Tonsillectomy     as a child  . Colonoscopy 2008  . Cardiac catheterization 2003  . Esophagogastroduodenoscopy   . Portacath placement 08/10/2011    Procedure: INSERTION PORT-A-CATH;  Surgeon: Norton Blizzard, MD;  Location: [facility] OR;  Service: Thoracic;  Laterality: Left;  Left Subclavian    REVIEW OF SYSTEMS:  A comprehensive review of systems was negative except for: Constitutional: positive for fatigue Respiratory: positive for dyspnea on exertion   PHYSICAL EXAMINATION: General appearance: alert, cooperative and no distress Head: Normocephalic, without obvious abnormality, atraumatic Neck: no adenopathy Lymph nodes: Cervical, supraclavicular, and axillary nodes normal. Resp: clear to auscultation bilaterally Cardio: regular  rate and rhythm, S1, S2 normal, no murmur, click, rub or gallop GI: soft, non-tender; bowel sounds normal; no masses,  no organomegaly Extremities: extremities normal, atraumatic, no cyanosis or edema Neurologic: Alert and oriented X 3, normal strength and tone. Normal symmetric reflexes. Normal coordination and gait  ECOG PERFORMANCE STATUS: 1 - Symptomatic but completely ambulatory  There were no vitals taken for this visit.  LABORATORY DATA: Lab Results  Component Value Date   WBC 6.6 12/09/2011   HGB 12.6* 12/09/2011   HCT 36.0* 12/09/2011   MCV 100.9* 12/09/2011   PLT 136* 12/09/2011      Chemistry      Component Value Date/Time   NA 138 12/09/2011 1011   K 4.1 12/09/2011 1011   CL 106 12/09/2011 1011   CO2 25 12/09/2011 1011   BUN 10 12/09/2011 1011   CREATININE 0.60 12/09/2011 1011   CREATININE 0.71 07/30/2011 1640      Component Value Date/Time   CALCIUM 8.6 12/09/2011 1011   ALKPHOS 154* 12/09/2011 1011   AST 26 12/09/2011 1011   ALT 19 12/09/2011 1011   BILITOT 1.3* 12/09/2011 1011       RADIOGRAPHIC STUDIES: Ct Chest W Contrast  11/17/2011  *RADIOLOGY REPORT*  Clinical Data: Lung cancer diagnosed 11/12.  Chemotherapy and radiation therapy completed 4 weeks ago.  Shortness of breath.  CT CHEST WITH CONTRAST  Technique:  Multidetector CT imaging of the chest was performed following the standard protocol during bolus administration of intravenous contrast.  Contrast: 80mL OMNIPAQUE IOHEXOL 300 MG/ML IJ SOLN  Comparison: Plain film of 08/10/2011.  Most recent chest CT of 07/20/2011.  P E T of 08/03/2011.  Findings: Lung windows demonstrate secretions along the nondependent trachea on image 88.  Moderate to severe centrilobular emphysema. Decreased size of a right upper lobe lung nodule/mass.  Today, this measures 1.2 x 1.4 cm on image 21 versus 3.5 x 2.5 cm on 07/20/2011. A nodule along the right minor fissure (inferior right upper lobe) measures 1.0 by 0.7 cm on image 38 versus 1.1  x 0.9 cm on the prior. Clear left lung.  Soft tissue windows demonstrate a left-sided Port-A-Cath which terminates at the mid SVC.  Tortuous descending thoracic aorta.  Normal heart size with advanced multivessel coronary artery atherosclerosis. No pericardial or pleural effusion.  No central pulmonary embolism, on this non-dedicated study. Low right paratracheal node measures 1.3 cm today versus 1.7 cm on the prior.  Image 30 today. A right-sided prevascular node measures 7 mm on image 29 today versus 1.3 cm on the prior.  The left prevascular/AP window  node described on the prior CT measures 1.0 by 1.2 cm on image 28 versus 1.2 x 1.2 on the prior.  No hilar adenopathy.  Stable small paraesophageal nodes.  9 mm right cardiophrenic angle node measured 8 mm on the prior (when remeasured).  Limited abdominal imaging demonstrates moderate cirrhosis.  No focal liver lesion.  Stable mildly prominent upper abdominal nodes, likely reactive. Normal adrenal glands.  5 mm sclerotic lesion within the upper thoracic spine eccentric left on image 12 is unchanged.  Likely a bone island.  IMPRESSION:  1.  Interval response to therapy of right upper lobe lung nodule/mass and ipsilateral mediastinal adenopathy. 2.  Inferior right upper lobe lung nodule which measures similar to slightly decreased in size today.  This could be incidental or represent response to therapy of pulmonary metastasis versus a synchronous primary. 3.  A left-sided mediastinal node measures similar to minimally decreased in size.  This was felt to not represent nodal metastasis on the prior PET.  This remains indeterminate. 4.  Right cardiophrenic angle node is felt to be similar, but mildly enlarged. Recommend attention on follow-up. 5.  Cirrhosis.  Original Report Authenticated By: Consuello Bossier, M.D.    ASSESSMENT: This is a very pleasant 71 years old white male with history of stage IIIB non-small cell lung cancer status post concurrent chemoradiation  and currently on consolidation chemotherapy with carboplatin and paclitaxel is status post one cycle. The patient is tolerating his treatment fairly well except for mild fatigue after the Neulasta injection.   PLAN: We'll proceed with cycle #2 today as scheduled. The patient will come back for followup visit in 3 weeks with the start of cycle #3. He would be given a refill for Compazine 10 mg by mouth every 6 hours as needed for nausea. He was also advised to take Claritin 10 mg by mouth daily for a few days after the Neulasta injection.  All questions were answered. The patient knows to call the clinic with any problems, questions or concerns. We can certainly see the patient much sooner if necessary.  I spent 20 minutes counseling the patient face to face. The total time spent in the appointment was 30 minutes.

## 2011-12-16 ENCOUNTER — Ambulatory Visit (HOSPITAL_BASED_OUTPATIENT_CLINIC_OR_DEPARTMENT_OTHER): Payer: Medicare Other

## 2011-12-16 VITALS — BP 103/72 | HR 121 | Temp 97.5°F

## 2011-12-16 DIAGNOSIS — C3410 Malignant neoplasm of upper lobe, unspecified bronchus or lung: Secondary | ICD-10-CM

## 2011-12-16 MED ORDER — PEGFILGRASTIM INJECTION 6 MG/0.6ML
6.0000 mg | Freq: Once | SUBCUTANEOUS | Status: AC
  Administered 2011-12-16: 6 mg via SUBCUTANEOUS
  Filled 2011-12-16: qty 0.6

## 2011-12-18 ENCOUNTER — Telehealth: Payer: Self-pay | Admitting: Internal Medicine

## 2011-12-18 NOTE — Telephone Encounter (Signed)
Added lb/fu for 5/6. Lmonvm informing pt. Other appts remain the same and pt to get new schedule when he comes in on 4/23.

## 2011-12-22 ENCOUNTER — Other Ambulatory Visit (HOSPITAL_BASED_OUTPATIENT_CLINIC_OR_DEPARTMENT_OTHER): Payer: Medicare Other

## 2011-12-22 DIAGNOSIS — C3490 Malignant neoplasm of unspecified part of unspecified bronchus or lung: Secondary | ICD-10-CM

## 2011-12-22 LAB — COMPREHENSIVE METABOLIC PANEL
AST: 35 U/L (ref 0–37)
Alkaline Phosphatase: 164 U/L — ABNORMAL HIGH (ref 39–117)
BUN: 11 mg/dL (ref 6–23)
Calcium: 8.7 mg/dL (ref 8.4–10.5)
Chloride: 103 mEq/L (ref 96–112)
Creatinine, Ser: 0.69 mg/dL (ref 0.50–1.35)

## 2011-12-22 LAB — CBC WITH DIFFERENTIAL/PLATELET
Basophils Absolute: 0 10*3/uL (ref 0.0–0.1)
EOS%: 0.5 % (ref 0.0–7.0)
Eosinophils Absolute: 0 10*3/uL (ref 0.0–0.5)
HCT: 37.7 % — ABNORMAL LOW (ref 38.4–49.9)
HGB: 12.9 g/dL — ABNORMAL LOW (ref 13.0–17.1)
MCH: 34.8 pg — ABNORMAL HIGH (ref 27.2–33.4)
NEUT#: 2.3 10*3/uL (ref 1.5–6.5)
NEUT%: 58.1 % (ref 39.0–75.0)
lymph#: 1.1 10*3/uL (ref 0.9–3.3)

## 2011-12-23 ENCOUNTER — Other Ambulatory Visit: Payer: Medicare Other | Admitting: Lab

## 2011-12-29 ENCOUNTER — Other Ambulatory Visit (HOSPITAL_BASED_OUTPATIENT_CLINIC_OR_DEPARTMENT_OTHER): Payer: Medicare Other | Admitting: Lab

## 2011-12-29 DIAGNOSIS — C3490 Malignant neoplasm of unspecified part of unspecified bronchus or lung: Secondary | ICD-10-CM

## 2011-12-29 LAB — CBC WITH DIFFERENTIAL/PLATELET
Basophils Absolute: 0 10*3/uL (ref 0.0–0.1)
Eosinophils Absolute: 0 10*3/uL (ref 0.0–0.5)
HGB: 13.6 g/dL (ref 13.0–17.1)
MONO#: 0.7 10*3/uL (ref 0.1–0.9)
NEUT#: 4.5 10*3/uL (ref 1.5–6.5)
RBC: 3.83 10*6/uL — ABNORMAL LOW (ref 4.20–5.82)
RDW: 14.9 % — ABNORMAL HIGH (ref 11.0–14.6)
WBC: 6.6 10*3/uL (ref 4.0–10.3)
lymph#: 1.5 10*3/uL (ref 0.9–3.3)
nRBC: 0 % (ref 0–0)

## 2011-12-29 LAB — COMPREHENSIVE METABOLIC PANEL
Albumin: 4 g/dL (ref 3.5–5.2)
Alkaline Phosphatase: 200 U/L — ABNORMAL HIGH (ref 39–117)
BUN: 8 mg/dL (ref 6–23)
CO2: 23 mEq/L (ref 19–32)
Calcium: 9.6 mg/dL (ref 8.4–10.5)
Chloride: 106 mEq/L (ref 96–112)
Glucose, Bld: 94 mg/dL (ref 70–99)
Potassium: 4.3 mEq/L (ref 3.5–5.3)
Total Protein: 7 g/dL (ref 6.0–8.3)

## 2012-01-04 ENCOUNTER — Ambulatory Visit (HOSPITAL_BASED_OUTPATIENT_CLINIC_OR_DEPARTMENT_OTHER): Payer: Medicare Other | Admitting: Physician Assistant

## 2012-01-04 ENCOUNTER — Telehealth: Payer: Self-pay | Admitting: Internal Medicine

## 2012-01-04 ENCOUNTER — Encounter: Payer: Self-pay | Admitting: Physician Assistant

## 2012-01-04 ENCOUNTER — Other Ambulatory Visit (HOSPITAL_BASED_OUTPATIENT_CLINIC_OR_DEPARTMENT_OTHER): Payer: Medicare Other

## 2012-01-04 VITALS — BP 127/89 | HR 98 | Temp 96.9°F | Ht 72.0 in | Wt 217.9 lb

## 2012-01-04 DIAGNOSIS — R1310 Dysphagia, unspecified: Secondary | ICD-10-CM

## 2012-01-04 DIAGNOSIS — C3410 Malignant neoplasm of upper lobe, unspecified bronchus or lung: Secondary | ICD-10-CM

## 2012-01-04 DIAGNOSIS — C3490 Malignant neoplasm of unspecified part of unspecified bronchus or lung: Secondary | ICD-10-CM

## 2012-01-04 DIAGNOSIS — R5381 Other malaise: Secondary | ICD-10-CM

## 2012-01-04 LAB — COMPREHENSIVE METABOLIC PANEL
ALT: 22 U/L (ref 0–53)
Albumin: 3.7 g/dL (ref 3.5–5.2)
Alkaline Phosphatase: 162 U/L — ABNORMAL HIGH (ref 39–117)
CO2: 22 mEq/L (ref 19–32)
Glucose, Bld: 106 mg/dL — ABNORMAL HIGH (ref 70–99)
Potassium: 4.1 mEq/L (ref 3.5–5.3)
Sodium: 139 mEq/L (ref 135–145)
Total Bilirubin: 0.6 mg/dL (ref 0.3–1.2)
Total Protein: 6.7 g/dL (ref 6.0–8.3)

## 2012-01-04 LAB — CBC WITH DIFFERENTIAL/PLATELET
BASO%: 0.5 % (ref 0.0–2.0)
Eosinophils Absolute: 0 10*3/uL (ref 0.0–0.5)
LYMPH%: 24.3 % (ref 14.0–49.0)
MCHC: 34.4 g/dL (ref 32.0–36.0)
MCV: 103 fL — ABNORMAL HIGH (ref 79.3–98.0)
MONO%: 12.7 % (ref 0.0–14.0)
NEUT#: 3.1 10*3/uL (ref 1.5–6.5)
Platelets: 146 10*3/uL (ref 140–400)
RBC: 3.78 10*6/uL — ABNORMAL LOW (ref 4.20–5.82)
RDW: 15.7 % — ABNORMAL HIGH (ref 11.0–14.6)
WBC: 5 10*3/uL (ref 4.0–10.3)
nRBC: 0 % (ref 0–0)

## 2012-01-04 MED ORDER — HYDROCODONE-ACETAMINOPHEN 5-325 MG PO TABS: 1.0000 | ORAL_TABLET | Freq: Four times a day (QID) | ORAL | Status: DC | PRN

## 2012-01-04 NOTE — Telephone Encounter (Signed)
Gv pt appt for [id number].  scheduled ct scan on 05/28 @ [facility]

## 2012-01-05 ENCOUNTER — Ambulatory Visit (HOSPITAL_BASED_OUTPATIENT_CLINIC_OR_DEPARTMENT_OTHER): Payer: Medicare Other

## 2012-01-05 ENCOUNTER — Other Ambulatory Visit: Payer: Medicare Other | Admitting: Lab

## 2012-01-05 ENCOUNTER — Other Ambulatory Visit: Payer: Self-pay | Admitting: Internal Medicine

## 2012-01-05 VITALS — BP 131/96 | HR 104 | Temp 97.0°F

## 2012-01-05 DIAGNOSIS — C3410 Malignant neoplasm of upper lobe, unspecified bronchus or lung: Secondary | ICD-10-CM

## 2012-01-05 DIAGNOSIS — Z5111 Encounter for antineoplastic chemotherapy: Secondary | ICD-10-CM

## 2012-01-05 MED ORDER — SODIUM CHLORIDE 0.9 % IV SOLN
Freq: Once | INTRAVENOUS | Status: AC
  Administered 2012-01-05: 09:00:00 via INTRAVENOUS

## 2012-01-05 MED ORDER — ONDANSETRON 16 MG/50ML IVPB (CHCC)
16.0000 mg | Freq: Once | INTRAVENOUS | Status: AC
  Administered 2012-01-05: 16 mg via INTRAVENOUS

## 2012-01-05 MED ORDER — PACLITAXEL CHEMO INJECTION 300 MG/50ML
175.0000 mg/m2 | Freq: Once | INTRAVENOUS | Status: AC
  Administered 2012-01-05: 390 mg via INTRAVENOUS
  Filled 2012-01-05: qty 65

## 2012-01-05 MED ORDER — DEXAMETHASONE SODIUM PHOSPHATE 4 MG/ML IJ SOLN
20.0000 mg | Freq: Once | INTRAMUSCULAR | Status: AC
  Administered 2012-01-05: 20 mg via INTRAVENOUS

## 2012-01-05 MED ORDER — SODIUM CHLORIDE 0.9 % IV SOLN
750.0000 mg | Freq: Once | INTRAVENOUS | Status: AC
  Administered 2012-01-05: 750 mg via INTRAVENOUS
  Filled 2012-01-05: qty 75

## 2012-01-05 MED ORDER — HEPARIN SOD (PORK) LOCK FLUSH 100 UNIT/ML IV SOLN
500.0000 [IU] | Freq: Once | INTRAVENOUS | Status: AC | PRN
  Administered 2012-01-05: 500 [IU]
  Filled 2012-01-05: qty 5

## 2012-01-05 MED ORDER — SODIUM CHLORIDE 0.9 % IJ SOLN
10.0000 mL | INTRAMUSCULAR | Status: DC | PRN
  Administered 2012-01-05: 10 mL
  Filled 2012-01-05: qty 10

## 2012-01-05 MED ORDER — FAMOTIDINE IN NACL 20-0.9 MG/50ML-% IV SOLN
20.0000 mg | Freq: Once | INTRAVENOUS | Status: AC
  Administered 2012-01-05: 20 mg via INTRAVENOUS

## 2012-01-05 MED ORDER — DIPHENHYDRAMINE HCL 50 MG/ML IJ SOLN
50.0000 mg | Freq: Once | INTRAMUSCULAR | Status: AC
  Administered 2012-01-05: 50 mg via INTRAVENOUS

## 2012-01-05 NOTE — Patient Instructions (Signed)
[facility] Discharge Instructions for Patients Receiving Chemotherapy  Today you received the following chemotherapy agents Taxol and Carboplatin.  To help prevent nausea and vomiting after your treatment, we encourage you to take your nausea medication as prescribed.   If you develop nausea and vomiting that is not controlled by your nausea medication, call the clinic. If it is after clinic hours your family physician or the after hours number for the clinic or go to the Emergency Department.   BELOW ARE SYMPTOMS THAT SHOULD BE REPORTED IMMEDIATELY:  *FEVER GREATER THAN 100.5 F  *CHILLS WITH OR WITHOUT FEVER  NAUSEA AND VOMITING THAT IS NOT CONTROLLED WITH YOUR NAUSEA MEDICATION  *UNUSUAL SHORTNESS OF BREATH  *UNUSUAL BRUISING OR BLEEDING  TENDERNESS IN MOUTH AND THROAT WITH OR WITHOUT PRESENCE OF ULCERS  *URINARY PROBLEMS  *BOWEL PROBLEMS  UNUSUAL RASH Items with * indicate a potential emergency and should be followed up as soon as possible.  One of the nurses will contact you 24 hours after your treatment. Please let the nurse know about any problems that you may have experienced. Feel free to call the clinic you have any questions or concerns. The clinic phone number is [phone].   I have been informed and understand all the instructions given to me. I know to contact the clinic, my physician, or go to the Emergency Department if any problems should occur. I do not have any questions at this time, but understand that I may call the clinic during office hours   should I have any questions or need assistance in obtaining follow up care.    __________________________________________  _____________  __________ Signature of Patient or Authorized Representative            Date                   Time    __________________________________________ Nurse's Signature    

## 2012-01-06 ENCOUNTER — Ambulatory Visit (HOSPITAL_BASED_OUTPATIENT_CLINIC_OR_DEPARTMENT_OTHER): Payer: Medicare Other

## 2012-01-06 VITALS — BP 123/78 | HR 117 | Temp 97.4°F

## 2012-01-06 DIAGNOSIS — C3410 Malignant neoplasm of upper lobe, unspecified bronchus or lung: Secondary | ICD-10-CM

## 2012-01-06 MED ORDER — PEGFILGRASTIM INJECTION 6 MG/0.6ML
6.0000 mg | Freq: Once | SUBCUTANEOUS | Status: AC
  Administered 2012-01-06: 6 mg via SUBCUTANEOUS
  Filled 2012-01-06: qty 0.6

## 2012-01-06 NOTE — Progress Notes (Signed)
[facility] [phone]) [phone]   Fax:(336) [phone]  OFFICE PROGRESS NOTE  [name], MD, MD [address] [place] [address]  DIAGNOSIS: Stage IIIa/IIIB non-small cell lung cancer   PRIOR THERAPY: Concurrent chemoradiation with chemotherapy in the form of weekly carboplatin for an AUC of 2 and paclitaxel at 45 mg per meter squared   CURRENT THERAPY: Consolidation chemotherapy with carboplatin for AUC of 5 and paclitaxel 175 mg/M2 every 3 weeks with Neulasta support, status post 2 cycles.   INTERVAL HISTORY: Joseph [name] 71 y.o. male returns to the clinic today for followup visit.  The patient tolerated the first 2 cycles of his consolidation chemotherapy fairly well with no significant complaints except for aching pain for 3 days after the Neulasta injection. He also reports some fatigue as well as some shortness of breath with exertion. He has some occasional dysphagia as well as some heartburn that he treats with TUMS. He reports that Nexium gave him diarrhea so he discontinued this medication. He was taking Carafate and states that he will resume this medication to help with the dysphagia. He is due for cycle 3 of his consolidation chemotherapy on 01/05/2012. He denied having any significant nausea or vomiting. He has no fever or chills. No significant weight loss or night sweats. He denied significant chest pain, cough or hemoptysis.   MEDICAL HISTORY: Past Medical History  Diagnosis Date  . History of colon polyps   . 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 Hyperlipidemia     takes Pravastatin  . Emphysema   . Lung mass     right upper lobe  . Shortness of breath     with exertion  . Cancer     lung  . Loose stools   . Enlarged prostate     takes Rapaflo daily  . Nocturia   . Arthritis     left knee and back arthrtis  . Hypothyroidism     takes Synthroid daily  . Cataract   . Hx of radiation therapy 09/02/11 to 10/19/11    chest     ALLERGIES:   has no known allergies.  MEDICATIONS:  Current Outpatient Prescriptions  Medication Sig Dispense Refill  . celecoxib (CELEBREX) 200 MG capsule Take 200 mg by mouth 2 (two) times daily as needed.       Marland Kitchen HYDROcodone-acetaminophen (NORCO) 5-325 MG per tablet Take 1 tablet by mouth every 6 (six) hours as needed for pain.  30 tablet  0  . lidocaine-prilocaine (EMLA) cream Apply 1 application topically as needed.        . loperamide (IMODIUM A-D) 2 MG tablet Take 2 mg by mouth 4 (four) times daily as needed.      . Misc Natural Products (PROSTATE HEALTH) CAPS Take by mouth.        . Multiple Vitamins-Minerals (MULTIVITAMINS THER. W/MINERALS) TABS Take 1 tablet by mouth daily.        Marland Kitchen nystatin-triamcinolone (MYCOLOG II) cream Apply 1 application topically daily as needed.       Marland Kitchen omeprazole (PRILOSEC) 20 MG capsule Take 20 mg by mouth daily.      . pravastatin (PRAVACHOL) 40 MG tablet Take 40 mg by mouth daily.       . prochlorperazine (COMPAZINE) 10 MG tablet Take 1 tablet (10 mg total) by mouth every 6 (six) hours as needed.  30 tablet  0  . RAPAFLO 8 MG CAPS capsule Take 8  mg by mouth daily with breakfast.       . sucralfate (CARAFATE) 1 G tablet Take 1 g by mouth 4 (four) times daily.      Marland Kitchen SYNTHROID 175 MCG tablet Take 150 mcg by mouth daily.        No current facility-administered medications for this visit.   Facility-Administered Medications Ordered in Other Visits  Medication Dose Route Frequency Provider Last Rate Last Dose  . pegfilgrastim (NEULASTA) injection 6 mg  6 mg Subcutaneous Once Conni Slipper, PA   6 mg at 01/06/12 1002  . DISCONTD: sodium chloride 0.9 % injection 10 mL  10 mL Intracatheter PRN Conni Slipper, PA   10 mL at 01/05/12 1424    SURGICAL HISTORY:  Past Surgical History  Procedure Date  . Hernia repair 70's  . Vasectomy 70's  . Finger surgery 2008    left pointer finger  . Knee arthroscopy 2008    left knee  . Tonsillectomy      as a child  . Colonoscopy 2008  . Cardiac catheterization 2003  . Esophagogastroduodenoscopy   . Portacath placement 08/10/2011    Procedure: INSERTION PORT-A-CATH;  Surgeon: Norton Blizzard, MD;  Location: [facility] OR;  Service: Thoracic;  Laterality: Left;  Left Subclavian    REVIEW OF SYSTEMS:  A comprehensive review of systems was negative except for: Constitutional: positive for fatigue Ears, nose, mouth, throat, and face: positive for Dysphagia Respiratory: positive for dyspnea on exertion   PHYSICAL EXAMINATION: General appearance: alert, cooperative and no distress Head: Normocephalic, without obvious abnormality, atraumatic Neck: no adenopathy Lymph nodes: Cervical, supraclavicular, and axillary nodes normal. Resp: clear to auscultation bilaterally Cardio: regular rate and rhythm, S1, S2 normal, no murmur, click, rub or gallop GI: soft, non-tender; bowel sounds normal; no masses,  no organomegaly Extremities: extremities normal, atraumatic, no cyanosis or edema Neurologic: Alert and oriented X 3, normal strength and tone. Normal symmetric reflexes. Normal coordination and gait  ECOG PERFORMANCE STATUS: 1 - Symptomatic but completely ambulatory  Blood pressure 127/89, pulse 98, temperature 96.9 F (36.1 C), temperature source Oral, height 6' (1.829 m), weight 217 lb 14.4 oz (98.839 kg).  LABORATORY DATA: Lab Results  Component Value Date   WBC 5.0 01/04/2012   HGB 13.4 01/04/2012   HCT 39.0 01/04/2012   MCV 103.0* 01/04/2012   PLT 146 01/04/2012      Chemistry      Component Value Date/Time   NA 139 01/04/2012 1129   K 4.1 01/04/2012 1129   CL 110 01/04/2012 1129   CO2 22 01/04/2012 1129   BUN 10 01/04/2012 1129   CREATININE 0.64 01/04/2012 1129   CREATININE 0.71 07/30/2011 1640      Component Value Date/Time   CALCIUM 8.6 01/04/2012 1129   ALKPHOS 162* 01/04/2012 1129   AST 30 01/04/2012 1129   ALT 22 01/04/2012 1129   BILITOT 0.6 01/04/2012 1129       RADIOGRAPHIC STUDIES: Ct Chest W  Contrast  11/17/2011  *RADIOLOGY REPORT*  Clinical Data: Lung cancer diagnosed 11/12.  Chemotherapy and radiation therapy completed 4 weeks ago.  Shortness of breath.  CT CHEST WITH CONTRAST  Technique:  Multidetector CT imaging of the chest was performed following the standard protocol during bolus administration of intravenous contrast.  Contrast: 80mL OMNIPAQUE IOHEXOL 300 MG/ML IJ SOLN  Comparison: Plain film of 08/10/2011.  Most recent chest CT of 07/20/2011.  P E T of 08/03/2011.  Findings: Lung windows demonstrate secretions along  the nondependent trachea on image 88.  Moderate to severe centrilobular emphysema. Decreased size of a right upper lobe lung nodule/mass.  Today, this measures 1.2 x 1.4 cm on image 21 versus 3.5 x 2.5 cm on 07/20/2011. A nodule along the right minor fissure (inferior right upper lobe) measures 1.0 by 0.7 cm on image 38 versus 1.1 x 0.9 cm on the prior. Clear left lung.  Soft tissue windows demonstrate a left-sided Port-A-Cath which terminates at the mid SVC.  Tortuous descending thoracic aorta.  Normal heart size with advanced multivessel coronary artery atherosclerosis. No pericardial or pleural effusion.  No central pulmonary embolism, on this non-dedicated study. Low right paratracheal node measures 1.3 cm today versus 1.7 cm on the prior.  Image 30 today. A right-sided prevascular node measures 7 mm on image 29 today versus 1.3 cm on the prior.  The left prevascular/AP window node described on the prior CT measures 1.0 by 1.2 cm on image 28 versus 1.2 x 1.2 on the prior.  No hilar adenopathy.  Stable small paraesophageal nodes.  9 mm right cardiophrenic angle node measured 8 mm on the prior (when remeasured).  Limited abdominal imaging demonstrates moderate cirrhosis.  No focal liver lesion.  Stable mildly prominent upper abdominal nodes, likely reactive. Normal adrenal glands.  5 mm sclerotic lesion within the upper thoracic spine eccentric left on image 12 is unchanged.   Likely a bone island.  IMPRESSION:  1.  Interval response to therapy of right upper lobe lung nodule/mass and ipsilateral mediastinal adenopathy. 2.  Inferior right upper lobe lung nodule which measures similar to slightly decreased in size today.  This could be incidental or represent response to therapy of pulmonary metastasis versus a synchronous primary. 3.  A left-sided mediastinal node measures similar to minimally decreased in size.  This was felt to not represent nodal metastasis on the prior PET.  This remains indeterminate. 4.  Right cardiophrenic angle node is felt to be similar, but mildly enlarged. Recommend attention on follow-up. 5.  Cirrhosis.  Original Report Authenticated By: Consuello Bossier, M.D.    ASSESSMENT/PLAN: This is a very pleasant 71 years old white male with history of stage IIIB non-small cell lung cancer status post concurrent chemoradiation and currently on consolidation chemotherapy with carboplatin and paclitaxel is status post one cycle. The patient is tolerating his treatment fairly well except for mild fatigue after the Neulasta injection. The patient was discussed with Dr. Arbutus Ped. He will proceed with cycle #3 of his consolidation chemotherapy as scheduled on 01/05/2012. For the discomfort with the Neulasta injection he is given a prescription for Norco 5/325, one tablet by mouth every 6 hours as needed for pain a total of 30 with no refill. He will continue with weekly labs consisting of a CBC differential and C. met. He will followup with Dr. Gwenyth Bouillon in 3 weeks with repeat CBC differential C. met and CT of the chest with contrast to reevaluate his disease. He is encouraged to resume the Carafate for his dysphasia and to notify us if this fails to address his symptoms.  Laural Benes, Rakhi Romagnoli E, PA-C   All questions were answered. The patient knows to call the clinic with any problems, questions or concerns. We can certainly see the patient much sooner if necessary.  Marland Kitchen

## 2012-01-26 ENCOUNTER — Ambulatory Visit (HOSPITAL_COMMUNITY)
Admission: RE | Admit: 2012-01-26 | Discharge: 2012-01-26 | Disposition: A | Discharge status: Home / Self Care | Payer: Medicare Other | Source: Ambulatory Visit | Attending: Physician Assistant | Admitting: Physician Assistant

## 2012-01-26 DIAGNOSIS — M949 Disorder of cartilage, unspecified: Secondary | ICD-10-CM | POA: Insufficient documentation

## 2012-01-26 DIAGNOSIS — R0602 Shortness of breath: Secondary | ICD-10-CM | POA: Insufficient documentation

## 2012-01-26 DIAGNOSIS — M899 Disorder of bone, unspecified: Secondary | ICD-10-CM | POA: Insufficient documentation

## 2012-01-26 DIAGNOSIS — K7460 Unspecified cirrhosis of liver: Secondary | ICD-10-CM | POA: Insufficient documentation

## 2012-01-26 DIAGNOSIS — J438 Other emphysema: Secondary | ICD-10-CM | POA: Insufficient documentation

## 2012-01-26 DIAGNOSIS — C3410 Malignant neoplasm of upper lobe, unspecified bronchus or lung: Secondary | ICD-10-CM | POA: Insufficient documentation

## 2012-01-26 MED ORDER — IOHEXOL 300 MG/ML  SOLN
80.0000 mL | Freq: Once | INTRAMUSCULAR | Status: AC | PRN
  Administered 2012-01-26: 80 mL via INTRAVENOUS

## 2012-01-27 ENCOUNTER — Ambulatory Visit (HOSPITAL_BASED_OUTPATIENT_CLINIC_OR_DEPARTMENT_OTHER): Payer: Medicare Other | Admitting: Internal Medicine

## 2012-01-27 ENCOUNTER — Other Ambulatory Visit (HOSPITAL_BASED_OUTPATIENT_CLINIC_OR_DEPARTMENT_OTHER): Payer: Medicare Other | Admitting: Lab

## 2012-01-27 ENCOUNTER — Telehealth: Payer: Self-pay | Admitting: Internal Medicine

## 2012-01-27 VITALS — BP 144/89 | HR 108 | Temp 96.8°F | Ht 72.0 in | Wt 217.7 lb

## 2012-01-27 DIAGNOSIS — R5381 Other malaise: Secondary | ICD-10-CM

## 2012-01-27 DIAGNOSIS — R5383 Other fatigue: Secondary | ICD-10-CM

## 2012-01-27 DIAGNOSIS — R0609 Other forms of dyspnea: Secondary | ICD-10-CM

## 2012-01-27 DIAGNOSIS — R0989 Other specified symptoms and signs involving the circulatory and respiratory systems: Secondary | ICD-10-CM

## 2012-01-27 DIAGNOSIS — C3490 Malignant neoplasm of unspecified part of unspecified bronchus or lung: Secondary | ICD-10-CM

## 2012-01-27 DIAGNOSIS — C3410 Malignant neoplasm of upper lobe, unspecified bronchus or lung: Secondary | ICD-10-CM

## 2012-01-27 LAB — CBC WITH DIFFERENTIAL/PLATELET
EOS%: 0.8 % (ref 0.0–7.0)
MCH: 34.9 pg — ABNORMAL HIGH (ref 27.2–33.4)
MCHC: 34.9 g/dL (ref 32.0–36.0)
MCV: 100 fL — ABNORMAL HIGH (ref 79.3–98.0)
MONO%: 14.4 % — ABNORMAL HIGH (ref 0.0–14.0)
RBC: 3.87 10*6/uL — ABNORMAL LOW (ref 4.20–5.82)
RDW: 15.7 % — ABNORMAL HIGH (ref 11.0–14.6)
nRBC: 0 % (ref 0–0)

## 2012-01-27 LAB — COMPREHENSIVE METABOLIC PANEL
ALT: 22 U/L (ref 0–53)
Alkaline Phosphatase: 194 U/L — ABNORMAL HIGH (ref 39–117)
Creatinine, Ser: 0.7 mg/dL (ref 0.50–1.35)
Sodium: 141 mEq/L (ref 135–145)
Total Bilirubin: 0.7 mg/dL (ref 0.3–1.2)
Total Protein: 6.7 g/dL (ref 6.0–8.3)

## 2012-01-27 NOTE — Progress Notes (Signed)
[facility] Telephone:([phone]) [phone]   Fax:(336) [phone]  OFFICE PROGRESS NOTE  [name], MD, MD [address] [place] [address]  DIAGNOSIS: Stage IIIa/IIIB non-small cell lung cancer   PRIOR THERAPY:  1) Concurrent chemoradiation with chemotherapy in the form of weekly carboplatin for an AUC of 2 and paclitaxel at 45 mg per meter squared. 2) Consolidation chemotherapy with carboplatin for AUC of 5 and paclitaxel 175 mg/M2 every 3 weeks with Neulasta support, status post 3 cycles, last cycle was given on 01/05/2012 with partial response.  CURRENT THERAPY: Observation  INTERVAL HISTORY: Joseph [name] 71 y.o. male returns to the clinic today for followup visit accompanied his wife. 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 He also has mild fatigue and shortness breath with exertion. He denied having any significant chest pain, cough or hemoptysis. No significant weight loss. He has repeat CT scan of the chest performed recently and he is here today for evaluation and discussion of his scan results.  MEDICAL HISTORY: Past Medical History  Diagnosis Date  . History of colon polyps   . 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 Hyperlipidemia     takes Pravastatin  . Emphysema   . Lung mass     right upper lobe  . Shortness of breath     with exertion  . Cancer     lung  . Loose stools   . Enlarged prostate     takes Rapaflo daily  . Nocturia   . Arthritis     left knee and back arthrtis  . Hypothyroidism     takes Synthroid daily  . Cataract   . Hx of radiation therapy 09/02/11 to 10/19/11    chest    ALLERGIES:   has no known allergies.  MEDICATIONS:  Current Outpatient Prescriptions  Medication Sig Dispense Refill  . celecoxib (CELEBREX) 200 MG capsule Take 200 mg by mouth 2 (two) times daily as needed.       Marland Kitchen HYDROcodone-acetaminophen  (NORCO) 5-325 MG per tablet Take 1 tablet by mouth every 6 (six) hours as needed for pain.  30 tablet  0  . lidocaine-prilocaine (EMLA) cream Apply 1 application topically as needed.        . loperamide (IMODIUM A-D) 2 MG tablet Take 2 mg by mouth 4 (four) times daily as needed.      . Misc Natural Products (PROSTATE HEALTH) CAPS Take by mouth.        . Multiple Vitamins-Minerals (MULTIVITAMINS THER. W/MINERALS) TABS Take 1 tablet by mouth daily.        Marland Kitchen nystatin-triamcinolone (MYCOLOG II) cream Apply 1 application topically daily as needed.       Marland Kitchen omeprazole (PRILOSEC) 20 MG capsule Take 20 mg by mouth daily.      . pravastatin (PRAVACHOL) 40 MG tablet Take 40 mg by mouth daily.       . prochlorperazine (COMPAZINE) 10 MG tablet Take 1 tablet (10 mg total) by mouth every 6 (six) hours as needed.  30 tablet  0  . RAPAFLO 8 MG CAPS capsule Take 8 mg by mouth daily with breakfast.       . sucralfate (CARAFATE) 1 G tablet Take 1 g by mouth 4 (four) times daily.      Marland Kitchen SYNTHROID 175 MCG tablet Take 150 mcg by mouth daily.  No current facility-administered medications for this visit.   Facility-Administered Medications Ordered in Other Visits  Medication Dose Route Frequency Provider Last Rate Last Dose  . iohexol (OMNIPAQUE) 300 MG/ML solution 80 mL  80 mL Intravenous Once PRN Medication Radiologist, MD   80 mL at 01/26/12 1322    SURGICAL HISTORY:  Past Surgical History  Procedure Date  . Hernia repair 70's  . Vasectomy 70's  . Finger surgery 2008    left pointer finger  . Knee arthroscopy 2008    left knee  . Tonsillectomy     as a child  . Colonoscopy 2008  . Cardiac catheterization 2003  . Esophagogastroduodenoscopy   . Portacath placement 08/10/2011    Procedure: INSERTION PORT-A-CATH;  Surgeon: Norton Blizzard, MD;  Location: [facility] OR;  Service: Thoracic;  Laterality: Left;  Left Subclavian    REVIEW OF SYSTEMS:  A comprehensive review of systems was negative except for:  Constitutional: positive for fatigue Respiratory: positive for dyspnea on exertion   PHYSICAL EXAMINATION: General appearance: alert, cooperative and no distress Neck: no adenopathy Lymph nodes: Cervical, supraclavicular, and axillary nodes normal. Resp: clear to auscultation bilaterally Cardio: regular rate and rhythm, S1, S2 normal, no murmur, click, rub or gallop GI: soft, non-tender; bowel sounds normal; no masses,  no organomegaly Extremities: extremities normal, atraumatic, no cyanosis or edema Neurologic: Alert and oriented X 3, normal strength and tone. Normal symmetric reflexes. Normal coordination and gait  ECOG PERFORMANCE STATUS: 1 - Symptomatic but completely ambulatory  Blood pressure 144/89, pulse 108, temperature 96.8 F (36 C), temperature source Oral, height 6' (1.829 m), weight 217 lb 11.2 oz (98.748 kg).  LABORATORY DATA: Lab Results  Component Value Date   WBC 4.9 01/27/2012   HGB 13.5 01/27/2012   HCT 38.7 01/27/2012   MCV 100.0* 01/27/2012   PLT 105* 01/27/2012      Chemistry      Component Value Date/Time   NA 139 01/04/2012 1129   K 4.1 01/04/2012 1129   CL 110 01/04/2012 1129   CO2 22 01/04/2012 1129   BUN 10 01/04/2012 1129   CREATININE 0.64 01/04/2012 1129   CREATININE 0.71 07/30/2011 1640      Component Value Date/Time   CALCIUM 8.6 01/04/2012 1129   ALKPHOS 162* 01/04/2012 1129   AST 30 01/04/2012 1129   ALT 22 01/04/2012 1129   BILITOT 0.6 01/04/2012 1129       RADIOGRAPHIC STUDIES: Ct Chest W Contrast  01/26/2012  *RADIOLOGY REPORT*  Clinical Data: Restaging.  Lung cancer diagnosed in November 2012. Chemotherapy and radiation air leak.  Shortness of breath.  CT CHEST WITH CONTRAST  Technique:  Multidetector CT imaging of the chest was performed following the standard protocol during bolus administration of intravenous contrast.  Contrast: 80mL OMNIPAQUE IOHEXOL 300 MG/ML  SOLN  Comparison: Chest CT 11/17/2011, 07/20/2011, and PET CT 08/03/2011.  Findings: Left  chest wall Port-A-Cath terminates in the mid superior vena cava.  At the level of the tip of the catheter, there is peripheral low density within the lateral aspect the superior vena cava, which could reflect some thrombus formation near the distal catheter tip.  Left prevascular lymph node is decreased at 8 x 9 mm (previously 10 x 12 mm).  Right prevascular lymph node measures 7 mm short axis, without significant change.  Right hilar lymph node measures 10 mm short axis (previously 13 mm).  Cardiophrenic lymph node measures 8 mm (previously 9 mm).  No new or enlarging  lymph nodes in the chest.  No supraclavicular or axillary lymphadenopathy identified.  Negative for pleural or pericardial effusion.  Advanced coronary artery atherosclerotic calcifications.  Heart size normal. Thoracic aorta normal in caliber and enhancement.  Severe centrilobular emphysema.  Right upper lobe spiculated nodule measures 11 x 11 mm (measurements exclude the spicules).  Previously, on 11/17/2011, this nodule measured 14 x 12 mm.  Inferior right upper lobe nodule near the minor fissure measures 8 x 6 mm (previously 10 x 7 mm). No new lung nodules.  No airspace disease.  Left lung remains clear.  The trachea and mainstem bronchi are patent.  Stable sclerotic 6 mm lesion in the left aspect of the T2 vertebral body.  Imaging of the upper abdomen shows normal adrenal glands. Cirrhotic contour the liver is again noted.  IMPRESSION:  1.  Continued positive response to therapy.  Slight interval decrease in size of some of the mediastinal and right hilar lymph nodes and slight decrease in right upper lobe spiculated lung nodule. 2.  Inferior right upper lobe lung nodule is also slightly decreased in size.  3.  Emphysema and cirrhosis. 4.  Probable thrombosis adjacent to the distal tip of the Port-A- Cath.  Original Report Authenticated By: Britta Mccreedy, M.D.    ASSESSMENT: This is a very pleasant 71 years old white male with history of stage  IIIB non-small cell lung cancer status post concurrent chemoradiation followed by consolidation chemotherapy with partial response.  PLAN: The patient is doing fine and tolerated his treatment well. I discussed the scan results with the patient and his wife. I had a lengthy discussion with him about his current disease status and prognosis. I recommended for him observation for now with repeat CT scan of the chest with contrast in 3 months. The patient will continue to have Port-A-Cath flush every 6 weeks. The patient was advised to call me immediately if he has any concerning symptoms in the interval.  All questions were answered. The patient knows to call the clinic with any problems, questions or concerns. We can certainly see the patient much sooner if necessary.  I spent 15 minutes counseling the patient face to face. The total time spent in the appointment was 25 minutes.

## 2012-01-27 NOTE — Telephone Encounter (Signed)
Gv pt appt for june-aug2013.  scheduled pt for ct scan on08/26 @ [facility]

## 2012-02-17 ENCOUNTER — Ambulatory Visit (HOSPITAL_BASED_OUTPATIENT_CLINIC_OR_DEPARTMENT_OTHER): Payer: Medicare Other

## 2012-02-17 VITALS — BP 148/99 | HR 103 | Temp 96.6°F

## 2012-02-17 DIAGNOSIS — C3490 Malignant neoplasm of unspecified part of unspecified bronchus or lung: Secondary | ICD-10-CM

## 2012-02-17 MED ORDER — SODIUM CHLORIDE 0.9 % IJ SOLN
10.0000 mL | INTRAMUSCULAR | Status: DC | PRN
  Administered 2012-02-17: 10 mL via INTRAVENOUS
  Filled 2012-02-17: qty 10

## 2012-02-17 MED ORDER — HEPARIN SOD (PORK) LOCK FLUSH 100 UNIT/ML IV SOLN
500.0000 [IU] | Freq: Once | INTRAVENOUS | Status: AC
  Administered 2012-02-17: 500 [IU] via INTRAVENOUS
  Filled 2012-02-17: qty 5

## 2012-03-17 ENCOUNTER — Telehealth: Payer: Self-pay | Admitting: Internal Medicine

## 2012-03-17 NOTE — Telephone Encounter (Signed)
s/w pt as he had lm to r/s flush from 7/31 to 8/2   aom

## 2012-04-01 ENCOUNTER — Ambulatory Visit (HOSPITAL_BASED_OUTPATIENT_CLINIC_OR_DEPARTMENT_OTHER): Payer: Medicare Other

## 2012-04-01 VITALS — BP 116/73 | HR 97 | Temp 97.4°F

## 2012-04-01 DIAGNOSIS — C3490 Malignant neoplasm of unspecified part of unspecified bronchus or lung: Secondary | ICD-10-CM

## 2012-04-01 DIAGNOSIS — Z452 Encounter for adjustment and management of vascular access device: Secondary | ICD-10-CM

## 2012-04-01 DIAGNOSIS — C3410 Malignant neoplasm of upper lobe, unspecified bronchus or lung: Secondary | ICD-10-CM

## 2012-04-01 MED ORDER — SODIUM CHLORIDE 0.9 % IJ SOLN
10.0000 mL | INTRAMUSCULAR | Status: DC | PRN
  Administered 2012-04-01: 10 mL via INTRAVENOUS
  Filled 2012-04-01: qty 10

## 2012-04-01 MED ORDER — HEPARIN SOD (PORK) LOCK FLUSH 100 UNIT/ML IV SOLN
500.0000 [IU] | Freq: Once | INTRAVENOUS | Status: AC
  Administered 2012-04-01: 500 [IU] via INTRAVENOUS
  Filled 2012-04-01: qty 5

## 2012-04-04 ENCOUNTER — Telehealth: Payer: Self-pay | Admitting: Medical Oncology

## 2012-04-04 NOTE — Telephone Encounter (Signed)
Pt.notified

## 2012-04-04 NOTE — Telephone Encounter (Addendum)
Pt is going to [place] - does pt need to take aspirin due to higher elevation or take any other precautions? Per Dr Donnald Garre there is no extra precautions just be aware his breathing may be worse with higher elevation

## 2012-04-25 ENCOUNTER — Other Ambulatory Visit (HOSPITAL_BASED_OUTPATIENT_CLINIC_OR_DEPARTMENT_OTHER): Payer: Medicare Other | Admitting: Lab

## 2012-04-25 ENCOUNTER — Ambulatory Visit (HOSPITAL_COMMUNITY)
Admission: RE | Admit: 2012-04-25 | Discharge: 2012-04-25 | Disposition: A | Discharge status: Home / Self Care | Payer: Medicare Other | Source: Ambulatory Visit | Attending: Internal Medicine | Admitting: Internal Medicine

## 2012-04-25 DIAGNOSIS — C3490 Malignant neoplasm of unspecified part of unspecified bronchus or lung: Secondary | ICD-10-CM | POA: Insufficient documentation

## 2012-04-25 DIAGNOSIS — I723 Aneurysm of iliac artery: Secondary | ICD-10-CM | POA: Insufficient documentation

## 2012-04-25 DIAGNOSIS — I714 Abdominal aortic aneurysm, without rupture, unspecified: Secondary | ICD-10-CM | POA: Insufficient documentation

## 2012-04-25 DIAGNOSIS — R911 Solitary pulmonary nodule: Secondary | ICD-10-CM | POA: Insufficient documentation

## 2012-04-25 DIAGNOSIS — C3410 Malignant neoplasm of upper lobe, unspecified bronchus or lung: Secondary | ICD-10-CM

## 2012-04-25 LAB — CBC WITH DIFFERENTIAL/PLATELET
BASO%: 0.5 % (ref 0.0–2.0)
Basophils Absolute: 0 10*3/uL (ref 0.0–0.1)
EOS%: 1.7 % (ref 0.0–7.0)
HGB: 15.8 g/dL (ref 13.0–17.1)
MCH: 34.6 pg — ABNORMAL HIGH (ref 27.2–33.4)
MCHC: 34.4 g/dL (ref 32.0–36.0)
MCV: 100.6 fL — ABNORMAL HIGH (ref 79.3–98.0)
MONO%: 10.9 % (ref 0.0–14.0)
NEUT%: 62.8 % (ref 39.0–75.0)
RDW: 13.3 % (ref 11.0–14.6)

## 2012-04-25 LAB — COMPREHENSIVE METABOLIC PANEL (CC13)
AST: 35 U/L — ABNORMAL HIGH (ref 5–34)
Alkaline Phosphatase: 140 U/L (ref 40–150)
BUN: 24 mg/dL (ref 7.0–26.0)
Creatinine: 1 mg/dL (ref 0.7–1.3)
Potassium: 4.2 mEq/L (ref 3.5–5.1)

## 2012-04-25 MED ORDER — IOHEXOL 300 MG/ML  SOLN
100.0000 mL | Freq: Once | INTRAMUSCULAR | Status: AC | PRN
  Administered 2012-04-25: 100 mL via INTRAVENOUS

## 2012-04-27 ENCOUNTER — Telehealth: Payer: Self-pay | Admitting: Internal Medicine

## 2012-04-27 ENCOUNTER — Ambulatory Visit (HOSPITAL_BASED_OUTPATIENT_CLINIC_OR_DEPARTMENT_OTHER): Payer: Medicare Other | Admitting: Internal Medicine

## 2012-04-27 VITALS — BP 120/81 | HR 91 | Temp 98.2°F | Resp 20 | Ht 72.0 in | Wt 213.7 lb

## 2012-04-27 DIAGNOSIS — C3410 Malignant neoplasm of upper lobe, unspecified bronchus or lung: Secondary | ICD-10-CM

## 2012-04-27 NOTE — Telephone Encounter (Signed)
appts made and printed for pt aom °

## 2012-04-27 NOTE — Patient Instructions (Signed)
Your CT scan of the chest, abdomen and pelvis is fine with no evidence for disease progression. You have an elevated bilirubin level (? Liver dysfunction) that may need close followup by your family doctor or gastroenterology. Followup in 3 months with repeat CT scan of the chest. Please call if you have any questions in the interval.

## 2012-04-27 NOTE — Progress Notes (Signed)
[facility] Telephone:(336) [phone]   Fax:(336) [phone]  OFFICE PROGRESS NOTE  DIAGNOSIS: Stage IIIa/IIIB non-small cell lung cancer   PRIOR THERAPY:  1) Concurrent chemoradiation with chemotherapy in the form of weekly carboplatin for an AUC of 2 and paclitaxel at 45 mg per meter squared.  2) Consolidation chemotherapy with carboplatin for AUC of 5 and paclitaxel 175 mg/M2 every 3 weeks with Neulasta support, status post 3 cycles, last cycle was given on 01/05/2012 with partial response.   CURRENT THERAPY: Observation  INTERVAL HISTORY: Joseph [name] 70 y.o. male returns to the clinic today for routine three-month followup visit accompanied by his wife. The patient is feeling fine with no specific complaints today. He denied having any significant chest pain but continues to have shortness breath with exertion. He denied having any cough or hemoptysis. No significant weight loss or night sweats. The patient has repeat CT scan of the chest, abdomen and pelvis performed recently and he is here for evaluation and discussion of his scan results.  MEDICAL HISTORY: Past Medical History  Diagnosis Date  . History of colon polyps   . 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 Hyperlipidemia     takes Pravastatin  . Emphysema   . Lung mass     right upper lobe  . Shortness of breath     with exertion  . Cancer     lung  . Loose stools   . Enlarged prostate     takes Rapaflo daily  . Nocturia   . Arthritis     left knee and back arthrtis  . Hypothyroidism     takes Synthroid daily  . Cataract   . Hx of radiation therapy 09/02/11 to 10/19/11    chest    ALLERGIES:   has no known allergies.  MEDICATIONS:  Current Outpatient Prescriptions  Medication Sig Dispense Refill  . celecoxib (CELEBREX) 200 MG capsule Take 200 mg by mouth 2 (two) times daily as needed.       Marland Kitchen HYDROcodone-acetaminophen (NORCO) 5-325 MG per tablet Take 1 tablet by mouth every 6 (six) hours  as needed for pain.  30 tablet  0  . lidocaine-prilocaine (EMLA) cream Apply 1 application topically as needed.        . loperamide (IMODIUM A-D) 2 MG tablet Take 2 mg by mouth 4 (four) times daily as needed.      . Misc Natural Products (PROSTATE HEALTH) CAPS Take by mouth.        . Multiple Vitamins-Minerals (MULTIVITAMINS THER. W/MINERALS) TABS Take 1 tablet by mouth daily.        Marland Kitchen nystatin-triamcinolone (MYCOLOG II) cream Apply 1 application topically daily as needed.       Marland Kitchen omeprazole (PRILOSEC) 20 MG capsule Take 20 mg by mouth daily.      . pravastatin (PRAVACHOL) 40 MG tablet Take 40 mg by mouth daily.       . prochlorperazine (COMPAZINE) 10 MG tablet Take 1 tablet (10 mg total) by mouth every 6 (six) hours as needed.  30 tablet  0  . RAPAFLO 8 MG CAPS capsule Take 8 mg by mouth daily with breakfast.       . sucralfate (CARAFATE) 1 G tablet Take 1 g by mouth 4 (four) times daily.      Marland Kitchen SYNTHROID 175 MCG tablet Take 150 mcg by mouth daily.         SURGICAL HISTORY:  Past Surgical  History  Procedure Date  . Hernia repair 70's  . Vasectomy 70's  . Finger surgery 2008    left pointer finger  . Knee arthroscopy 2008    left knee  . Tonsillectomy     as a child  . Colonoscopy 2008  . Cardiac catheterization 2003  . Esophagogastroduodenoscopy   . Portacath placement 08/10/2011    Procedure: INSERTION PORT-A-CATH;  Surgeon: Norton Blizzard, MD;  Location: [facility] OR;  Service: Thoracic;  Laterality: Left;  Left Subclavian    REVIEW OF SYSTEMS:  A comprehensive review of systems was negative.   PHYSICAL EXAMINATION: General appearance: alert, cooperative and no distress Head: Normocephalic, without obvious abnormality, atraumatic Neck: no adenopathy Lymph nodes: Cervical, supraclavicular, and axillary nodes normal. Resp: clear to auscultation bilaterally Cardio: regular rate and rhythm, S1, S2 normal, no murmur, click, rub or gallop GI: soft, non-tender; bowel sounds normal; no  masses,  no organomegaly Extremities: extremities normal, atraumatic, no cyanosis or edema Neurologic: Alert and oriented X 3, normal strength and tone. Normal symmetric reflexes. Normal coordination and gait  ECOG PERFORMANCE STATUS: 1 - Symptomatic but completely ambulatory  Blood pressure 120/81, pulse 91, temperature 98.2 F (36.8 C), temperature source Oral, resp. rate 20, height 6' (1.829 m), weight 213 lb 11.2 oz (96.934 kg).  LABORATORY DATA: Lab Results  Component Value Date   WBC 5.1 04/25/2012   HGB 15.8 04/25/2012   HCT 45.9 04/25/2012   MCV 100.6* 04/25/2012   PLT 147 04/25/2012      Chemistry      Component Value Date/Time   NA 138 04/25/2012 1059   NA 141 01/27/2012 1053   K 4.2 04/25/2012 1059   K 4.4 01/27/2012 1053   CL 105 04/25/2012 1059   CL 106 01/27/2012 1053   CO2 23 04/25/2012 1059   CO2 27 01/27/2012 1053   BUN 24.0 04/25/2012 1059   BUN 12 01/27/2012 1053   CREATININE 1.0 04/25/2012 1059   CREATININE 0.70 01/27/2012 1053   CREATININE 0.71 07/30/2011 1640      Component Value Date/Time   CALCIUM 9.3 04/25/2012 1059   CALCIUM 8.9 01/27/2012 1053   ALKPHOS 140 04/25/2012 1059   ALKPHOS 194* 01/27/2012 1053   AST 35* 04/25/2012 1059   AST 32 01/27/2012 1053   ALT 26 04/25/2012 1059   ALT 22 01/27/2012 1053   BILITOT 2.20* 04/25/2012 1059   BILITOT 0.7 01/27/2012 1053       RADIOGRAPHIC STUDIES: Ct Chest W Contrast  04/25/2012  *RADIOLOGY REPORT*  Clinical Data:  Follow up lung cancer.  Chemotherapy and  radiation therapy completed June 2013.  CT CHEST, ABDOMEN AND PELVIS WITH CONTRAST  Technique:  Multidetector CT imaging of the chest, abdomen and pelvis was performed following the standard protocol during bolus administration of intravenous contrast.  Contrast: OMNIPAQUE IOHEXOL 300 MG/ML  SOLN  Comparison:   PET CT scan 08/03/2011,  CT thorax 01/26/2012  CT CHEST  Findings:  Port in the left anterior chest wall.  No axillary or supraclavicular lymphadenopathy.   No mediastinal lymphadenopathy. Right lower paratracheal lymph node measures 9 mm (image 27) compared to 10 mm on prior. Small prevascular lymph nodes are also unchanged.  No hilar lymphadenopathy.  No pericardial fluid. Esophagus is normal.  Spiculated right upper lobe pulmonary nodule measures 12 mm x 10 mm not changed from 12 mm x 11 mm on prior.  There are no new pulmonary nodules.  There is extensive emphysematous change of the  upper lobes.  Along the horizontal fissure evidence of flat 8  mm nodule  (image 35) which is unchanged from prior.  IMPRESSION:  1.  No evidence of lung cancer progression.  Stable chest. 2.  Stable spiculated nodule in the right upper lobe. 3.  Stable small mediastinal lymph nodes. 4.  Stable pleural nodule along the horizontal fissure. 5.  Severe emphysematous change.  CT ABDOMEN AND PELVIS  Findings:  There is no focal hepatic lesion.  Gallbladder, pancreas, spleen, adrenal glands, and kidneys are normal.  The stomach, small bowel, appendix, and colon are normal.  There are multiple diverticula the sigmoid colon.  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  These findings do not appear appreciably changed from noncontrast CT of 08/03/2011.  Prostate gland bladder normal.  No pelvic lymphadenopathy. Review of  bone windows demonstrates no aggressive osseous lesions.  IMPRESSION:  1.  No evidence of lung cancer metastasis within the abdomen or pelvis. 2.  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   Original Report Authenticated By: Genevive Bi, M.D.      ASSESSMENT: This is a very pleasant 71 years old white male with history of stage IIIa/IIIB non-small cell lung cancer status post concurrent chemoradiation followed by 3 cycles of consolidation chemotherapy, last treatment was given on 01/05/2012. The patient is doing fine with no evidence for disease progression. He has  elevated bilirubin that need close monitoring.  PLAN: I discussed the lab and scan results with the patient. I recommended for him continuous observation with repeat CT scan of the chest in 3 months. For the total bilirubin that the patient would contact his primary care physician Dr. Clelia Croft for followup visit and evaluation. He has no other significant abnormalities in his liver function. He was advised to call me immediately if he has any concerning symptoms in the interval.  All questions were answered. The patient knows to call the clinic with any problems, questions or concerns. We can certainly see the patient much sooner if necessary.

## 2012-05-03 ENCOUNTER — Telehealth: Payer: Self-pay | Admitting: *Deleted

## 2012-05-03 ENCOUNTER — Other Ambulatory Visit: Payer: Self-pay | Admitting: *Deleted

## 2012-05-03 NOTE — Telephone Encounter (Signed)
Called and spoke with pt regarding scheduling port fluhses, pt stated he forgot to mention that he has continued to have some tingling in his finger and feet.  This is not affected his ADL's.  Will inform Dr Donnald Garre, if anything changes or gets worse he is to call back and let us know, he verbalized understanding.  SLJ

## 2012-05-05 ENCOUNTER — Telehealth: Payer: Self-pay | Admitting: *Deleted

## 2012-05-05 NOTE — Telephone Encounter (Signed)
please schedule port fluhses every 6 weeks x 6.   Left voice message to inform the patient of the flush apppointments

## 2012-05-13 ENCOUNTER — Telehealth: Payer: Self-pay | Admitting: Internal Medicine

## 2012-05-13 NOTE — Telephone Encounter (Signed)
Pt called left message, called pt back gave her appt for flush until November 2013

## 2012-05-13 NOTE — Telephone Encounter (Signed)
Pt called back wants to cancel 11/26 flush , pt will wait until CT appt to flush port

## 2012-05-20 ENCOUNTER — Ambulatory Visit
Admission: RE | Admit: 2012-05-20 | Discharge: 2012-05-20 | Disposition: A | Discharge status: Home / Self Care | Payer: Medicare Other | Source: Ambulatory Visit | Attending: Radiation Oncology | Admitting: Radiation Oncology

## 2012-05-20 ENCOUNTER — Encounter: Payer: Self-pay | Admitting: Radiation Oncology

## 2012-05-20 VITALS — BP 147/84 | HR 75 | Temp 97.4°F | Resp 18 | Wt 213.9 lb

## 2012-05-20 DIAGNOSIS — C3410 Malignant neoplasm of upper lobe, unspecified bronchus or lung: Secondary | ICD-10-CM

## 2012-05-20 NOTE — Progress Notes (Addendum)
Radiation Oncology         [phone]) [phone] ________________________________  Name: Joseph [name] MRN: [id number]  Date: 05/20/2012  DOB: Jan 07, 1941  Follow-Up Visit Note  CC: [name], MD  Ines Bloomer, MD  Diagnosis:   Non-small cell lung cancer, stage III  Interval Since Last Radiation:  Approximately 7 months   Narrative:  The patient returns today for routine follow-up.  The patient states that his breathing has improved in recent weeks 2 over the last month. His energy level has remained down but is slowly improving as well. He has an occasional cough. No hemoptysis. The patient has been is nonsmoker for 11 months. Patient's last CT scan of the chest was completed on 04/25/2012. There is no evidence of lung cancer progression at that time. A CT scan of the abdomen and pelvis also on that date showed no evidence of lung cancer metastasis in this region. Within the chest, a stable spiculated nodule in the right upper lobe was present as well as stable small mediastinal lymph nodes. The patient has previously reviewed this information with Dr. Shirline Frees and is continuing on observation with followup and repeat scans in a couple of months.                              ALLERGIES:   has no known allergies.  Meds: Current Outpatient Prescriptions  Medication Sig Dispense Refill  . Multiple Vitamins-Minerals (MULTIVITAMINS THER. W/MINERALS) TABS Take 1 tablet by mouth daily.        . pravastatin (PRAVACHOL) 40 MG tablet Take 40 mg by mouth daily.       Marland Kitchen RAPAFLO 8 MG CAPS capsule Take 8 mg by mouth daily with breakfast.       . SYNTHROID 175 MCG tablet Take 150 mcg by mouth daily.       . celecoxib (CELEBREX) 200 MG capsule Take 200 mg by mouth 2 (two) times daily as needed.       Marland Kitchen HYDROcodone-acetaminophen (NORCO) 5-325 MG per tablet Take 1 tablet by mouth every 6 (six) hours as needed for pain.  30 tablet  0  . lidocaine-prilocaine (EMLA) cream Apply 1 application topically  as needed.        . loperamide (IMODIUM A-D) 2 MG tablet Take 2 mg by mouth 4 (four) times daily as needed.      . Misc Natural Products (PROSTATE HEALTH) CAPS Take by mouth.        . nystatin-triamcinolone (MYCOLOG II) cream Apply 1 application topically daily as needed.       Marland Kitchen omeprazole (PRILOSEC) 20 MG capsule Take 20 mg by mouth daily.      . prochlorperazine (COMPAZINE) 10 MG tablet Take 1 tablet (10 mg total) by mouth every 6 (six) hours as needed.  30 tablet  0  . sucralfate (CARAFATE) 1 G tablet Take 1 g by mouth 4 (four) times daily.        Physical Findings: The patient is in no acute distress. Patient is alert and oriented.  weight is 213 lb 14.4 oz (97.024 kg). His oral temperature is 97.4 F (36.3 C). His blood pressure is 147/84 and his pulse is 75. His respiration is 18 and oxygen saturation is 91%. .   General: Well-developed, in no acute distress HEENT: Normocephalic, atraumatic Cardiovascular: Regular rate and rhythm Respiratory: Clear to auscultation bilaterally GI: Soft, nontender, normal bowel sounds Extremities: No edema present  Lab Findings: Lab Results  Component Value Date   WBC 5.1 04/25/2012   HGB 15.8 04/25/2012   HCT 45.9 04/25/2012   MCV 100.6* 04/25/2012   PLT 147 04/25/2012     Radiographic Findings: Ct Chest W Contrast  04/25/2012  *RADIOLOGY REPORT*  Clinical Data:  Follow up lung cancer.  Chemotherapy and  radiation therapy completed June 2013.  CT CHEST, ABDOMEN AND PELVIS WITH CONTRAST  Technique:  Multidetector CT imaging of the chest, abdomen and pelvis was performed following the standard protocol during bolus administration of intravenous contrast.  Contrast: OMNIPAQUE IOHEXOL 300 MG/ML  SOLN  Comparison:   PET CT scan 08/03/2011,  CT thorax 01/26/2012  CT CHEST  Findings:  Port in the left anterior chest wall.  No axillary or supraclavicular lymphadenopathy.  No mediastinal lymphadenopathy. Right lower paratracheal lymph node measures 9  mm (image 27) compared to 10 mm on prior. Small prevascular lymph nodes are also unchanged.  No hilar lymphadenopathy.  No pericardial fluid. Esophagus is normal.  Spiculated right upper lobe pulmonary nodule measures 12 mm x 10 mm not changed from 12 mm x 11 mm on prior.  There are no new pulmonary nodules.  There is extensive emphysematous change of the upper lobes.  Along the horizontal fissure evidence of flat 8  mm nodule  (image 35) which is unchanged from prior.  IMPRESSION:  1.  No evidence of lung cancer progression.  Stable chest. 2.  Stable spiculated nodule in the right upper lobe. 3.  Stable small mediastinal lymph nodes. 4.  Stable pleural nodule along the horizontal fissure. 5.  Severe emphysematous change.  CT ABDOMEN AND PELVIS  Findings:  There is no focal hepatic lesion.  Gallbladder, pancreas, spleen, adrenal glands, and kidneys are normal.  The stomach, small bowel, appendix, and colon are normal.  There are multiple diverticula the sigmoid colon.  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  These findings do not appear appreciably changed from noncontrast CT of 08/03/2011.  Prostate gland bladder normal.  No pelvic lymphadenopathy. Review of  bone windows demonstrates no aggressive osseous lesions.  IMPRESSION:  1.  No evidence of lung cancer metastasis within the abdomen or pelvis. 2.  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   Original Report Authenticated By: Genevive Bi, M.D.    Ct Abdomen Pelvis W Contrast  04/25/2012  *RADIOLOGY REPORT*  Clinical Data:  Follow up lung cancer.  Chemotherapy and  radiation therapy completed June 2013.  CT CHEST, ABDOMEN AND PELVIS WITH CONTRAST  Technique:  Multidetector CT imaging of the chest, abdomen and pelvis was performed following the standard protocol during bolus administration of intravenous contrast.  Contrast: OMNIPAQUE IOHEXOL  300 MG/ML  SOLN  Comparison:   PET CT scan 08/03/2011,  CT thorax 01/26/2012  CT CHEST  Findings:  Port in the left anterior chest wall.  No axillary or supraclavicular lymphadenopathy.  No mediastinal lymphadenopathy. Right lower paratracheal lymph node measures 9 mm (image 27) compared to 10 mm on prior. Small prevascular lymph nodes are also unchanged.  No hilar lymphadenopathy.  No pericardial fluid. Esophagus is normal.  Spiculated right upper lobe pulmonary nodule measures 12 mm x 10 mm not changed from 12 mm x 11 mm on prior.  There are no new pulmonary nodules.  There is extensive emphysematous change of the upper lobes.  Along the horizontal  fissure evidence of flat 8  mm nodule  (image 35) which is unchanged from prior.  IMPRESSION:  1.  No evidence of lung cancer progression.  Stable chest. 2.  Stable spiculated nodule in the right upper lobe. 3.  Stable small mediastinal lymph nodes. 4.  Stable pleural nodule along the horizontal fissure. 5.  Severe emphysematous change.  CT ABDOMEN AND PELVIS  Findings:  There is no focal hepatic lesion.  Gallbladder, pancreas, spleen, adrenal glands, and kidneys are normal.  The stomach, small bowel, appendix, and colon are normal.  There are multiple diverticula the sigmoid colon.  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  These findings do not appear appreciably changed from noncontrast CT of 08/03/2011.  Prostate gland bladder normal.  No pelvic lymphadenopathy. Review of  bone windows demonstrates no aggressive osseous lesions.  IMPRESSION:  1.  No evidence of lung cancer metastasis within the abdomen or pelvis. 2.  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   Original Report Authenticated By: Genevive Bi, M.D.     Impression:    71 year old male status post chemoradiotherapy and subsequent consolidative chemotherapy for stage III non-small cell lung  cancer. His imaging has looked fairly good with a reasonable partial response with no evidence of progression on his scans. He is continuing on followup/observation at this time. His breathing has been improving as has his fatigue although this has been lagging to some degree.  Plan:  The patient has repeat scans in a couple of months with followup with Dr. Shirline Frees. He will return to our clinic on a when necessary basis.   The patient was seen for 15 minutes today, the majority of which was spent counseling him on his diagnosis of lung cancer and coordinating his care.   Radene Gunning, M.D., Ph.D.

## 2012-05-20 NOTE — Progress Notes (Signed)
Patient presents to the clinic today unaccompanied for a follow up appointment with Dr. Mitzi Hansen. Patient is alert and oriented to person, place, and time. No distress noted. Steady gait noted. Pleasant affect noted. Patient denies pain at this time. Patient reports an occasional cough with white sputum. Patient denies blood in his sputum. Patient reports shortness of breath with exertion. Patient reports he has not smoked in 11 months. Patient remains active. Patient denies pain or difficulty swallowing. Patient reports a good appetite. Also, patient reports sleeping without difficulty. Reported all finding to Dr. Mitzi Hansen.

## 2012-05-27 ENCOUNTER — Other Ambulatory Visit: Payer: Self-pay | Admitting: Internal Medicine

## 2012-05-27 DIAGNOSIS — R7989 Other specified abnormal findings of blood chemistry: Secondary | ICD-10-CM

## 2012-06-01 ENCOUNTER — Ambulatory Visit
Admission: RE | Admit: 2012-06-01 | Discharge: 2012-06-01 | Disposition: A | Discharge status: Home / Self Care | Payer: Medicare Other | Source: Ambulatory Visit | Attending: Internal Medicine | Admitting: Internal Medicine

## 2012-06-01 ENCOUNTER — Encounter: Payer: Self-pay | Admitting: Internal Medicine

## 2012-06-01 DIAGNOSIS — R7989 Other specified abnormal findings of blood chemistry: Secondary | ICD-10-CM

## 2012-06-14 ENCOUNTER — Ambulatory Visit (HOSPITAL_BASED_OUTPATIENT_CLINIC_OR_DEPARTMENT_OTHER): Payer: Medicare Other

## 2012-06-14 VITALS — BP 105/72 | HR 87 | Temp 98.1°F

## 2012-06-14 DIAGNOSIS — Z452 Encounter for adjustment and management of vascular access device: Secondary | ICD-10-CM

## 2012-06-14 DIAGNOSIS — Z95828 Presence of other vascular implants and grafts: Secondary | ICD-10-CM

## 2012-06-14 DIAGNOSIS — C3410 Malignant neoplasm of upper lobe, unspecified bronchus or lung: Secondary | ICD-10-CM

## 2012-06-14 MED ORDER — HEPARIN SOD (PORK) LOCK FLUSH 100 UNIT/ML IV SOLN
500.0000 [IU] | Freq: Once | INTRAVENOUS | Status: AC
  Administered 2012-06-14: 500 [IU] via INTRAVENOUS
  Filled 2012-06-14: qty 5

## 2012-06-14 MED ORDER — SODIUM CHLORIDE 0.9 % IJ SOLN
10.0000 mL | INTRAMUSCULAR | Status: DC | PRN
  Administered 2012-06-14: 10 mL via INTRAVENOUS
  Filled 2012-06-14: qty 10

## 2012-07-04 ENCOUNTER — Encounter: Payer: Self-pay | Admitting: Internal Medicine

## 2012-07-04 ENCOUNTER — Other Ambulatory Visit: Payer: Medicare Other

## 2012-07-04 ENCOUNTER — Ambulatory Visit (INDEPENDENT_AMBULATORY_CARE_PROVIDER_SITE_OTHER): Payer: Medicare Other | Admitting: Internal Medicine

## 2012-07-04 ENCOUNTER — Other Ambulatory Visit (INDEPENDENT_AMBULATORY_CARE_PROVIDER_SITE_OTHER): Payer: Medicare Other

## 2012-07-04 VITALS — BP 114/80 | HR 76 | Ht 71.5 in | Wt 202.6 lb

## 2012-07-04 DIAGNOSIS — R7989 Other specified abnormal findings of blood chemistry: Secondary | ICD-10-CM

## 2012-07-04 DIAGNOSIS — Z860100 Personal history of colon polyps, unspecified: Secondary | ICD-10-CM

## 2012-07-04 DIAGNOSIS — R748 Abnormal levels of other serum enzymes: Secondary | ICD-10-CM

## 2012-07-04 DIAGNOSIS — R932 Abnormal findings on diagnostic imaging of liver and biliary tract: Secondary | ICD-10-CM

## 2012-07-04 DIAGNOSIS — C3490 Malignant neoplasm of unspecified part of unspecified bronchus or lung: Secondary | ICD-10-CM

## 2012-07-04 HISTORY — DX: Personal history of colonic polyps: Z86.010

## 2012-07-04 HISTORY — DX: Personal history of colon polyps, unspecified: Z86.0100

## 2012-07-04 LAB — HEPATIC FUNCTION PANEL: Total Bilirubin: 1.6 mg/dL — ABNORMAL HIGH (ref 0.3–1.2)

## 2012-07-04 NOTE — Patient Instructions (Addendum)
You have been scheduled for an MRI at [facility]  on 07/08/12. Your appointment time is 9:00am. Please arrive 15 minutes prior to your appointment time for registration purposes. There is no prep for this test. However, if you have any metal in your body, have a pacemaker or defibrillator, please be sure to let your ordering physician know. This test typically takes 45 minutes to 1 hour to complete.  Your physician has requested that you go to the basement for the following lab work before leaving today: Hepatic Function Panel  Thank you for choosing me and [facility].  Iva Boop, M.D., [facility]

## 2012-07-04 NOTE — Progress Notes (Signed)
Subjective:    Patient ID: Joseph [name], male    DOB: Jul 18, 1941, 71 y.o.   MRN: [id number]  HPI This is a very pleasant elderly white man who presents for evaluation of abnormal lab testing and a question of cirrhosis on ultrasound. This past year he has been treated for stage III non-small  carcinoma of the lung by Dr. Shirline Frees and Dr. Mitzi Hansen., with chemotherapy and radiation therapy. During this time in followup he was noted have an elevated bilirubin of 2.2  August, and this was normal in May.. He's had a mild elevation of his alkaline phosphatase as well as his reviewed and the records. He saw his primary care physician, ultrasound and suggested cirrhosis. The patient has no clear risk factors for that, he has never been a heavy drinker, he had negative hepatitis A, B, and C screening, ANA is negative and coagulation parameters are normal. A ferritin was normal also. He has never been told he had liver problems until this time. CT scan earlier this year did not mention of abnormal liver. There is no significant family history of liver problems. No Known Allergies Outpatient Prescriptions Prior to Visit  Medication Sig Dispense Refill  . celecoxib (CELEBREX) 200 MG capsule Take 200 mg by mouth 2 (two) times daily as needed.       Marland Kitchen HYDROcodone-acetaminophen (NORCO) 5-325 MG per tablet Take 1 tablet by mouth every 6 (six) hours as needed for pain.  30 tablet  0  . lidocaine-prilocaine (EMLA) cream Apply 1 application topically as needed.        . loperamide (IMODIUM A-D) 2 MG tablet Take 2 mg by mouth 4 (four) times daily as needed.      . Misc Natural Products (PROSTATE HEALTH) CAPS Take by mouth.        . Multiple Vitamins-Minerals (MULTIVITAMINS THER. W/MINERALS) TABS Take 1 tablet by mouth daily.        Marland Kitchen nystatin-triamcinolone (MYCOLOG II) cream Apply 1 application topically daily as needed.       . prochlorperazine (COMPAZINE) 10 MG tablet Take 1 tablet (10 mg total) by mouth every 6  (six) hours as needed.  30 tablet  0  . RAPAFLO 8 MG CAPS capsule Take 8 mg by mouth daily with breakfast.       . SYNTHROID 175 MCG tablet Take 150 mcg by mouth daily.       . [DISCONTINUED] omeprazole (PRILOSEC) 20 MG capsule Take 20 mg by mouth daily.      . [DISCONTINUED] pravastatin (PRAVACHOL) 40 MG tablet Take 40 mg by mouth daily.       . [DISCONTINUED] sucralfate (CARAFATE) 1 G tablet Take 1 g by mouth 4 (four) times daily.       Last reviewed on 07/04/2012 11:25 AM by Iva Boop, MD Past Medical History  Diagnosis Date  . History of colon polyps   . 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 Hyperlipidemia     takes Pravastatin  . Emphysema   . Lung mass     right upper lobe  . Shortness of breath     with exertion  . Lung cancer   . Loose stools   . Enlarged prostate     takes Rapaflo daily  . Nocturia   . Arthritis     left knee and back arthrtis  . Hypothyroidism     takes Synthroid daily  . Cataract   . Hx of radiation therapy 09/02/11 to  10/19/11    chest  . Diverticulosis    Past Surgical History  Procedure Date  . Hernia repair 70's  . Vasectomy 70's  . Finger surgery 2008    left pointer finger  . Knee arthroscopy 2008    left knee  . Tonsillectomy     as a child  . Colonoscopy w/ biopsies     multiple   . Cardiac catheterization 2003  . Esophagogastroduodenoscopy   . Portacath placement 08/10/2011    Procedure: INSERTION PORT-A-CATH;  Surgeon: Norton Blizzard, MD;  Location: [facility] OR;  Service: Thoracic;  Laterality: Left;  Left Subclavian   colonoscopy History   Social History  . Marital Status: Married    Spouse Name: N/A    Number of Children: N/A  . Years of Education: N/A   Social History Main Topics  . Smoking status: Former Smoker -- 1.5 packs/day for 40 years    Types: Cigarettes    Quit date: 07/02/2011  . Smokeless tobacco: Former Neurosurgeon  . Alcohol Use: Yes     Comment: social  . Drug Use: No    Family History  Problem Relation Age  of Onset  . Anesthesia problems Neg Hx   . Hypotension Neg Hx   . Malignant hyperthermia Neg Hx   . Pseudochol deficiency Neg Hx   . Cancer Father   . Diabetes Father   . Hyperlipidemia Brother   . Aneurysm Brother     Abdominal aortic aneurysm        Review of Systems Intentional 20 pound weight loss. Some dyspnea. Some increased urination. All other review of systems are negative or as per history of present illness    Objective:   Physical Exam General:  Well-developed, well-nourished and in no acute distress Eyes:  anicteric. ENT:   Mouth and posterior pharynx free of lesions.  Neck:   supple w/o thyromegaly or mass.  Lungs: Clear to auscultation bilaterally. Port-A-Cath in left anterior upper chest wall Heart:  S1S2, no rubs, murmurs, gallops. Abdomen:  soft, non-tender, no hepatosplenomegaly, hernia, or mass and BS+.  Lymph:  no cervical or supraclavicular adenopathy. Extremities:   no edema Skin   no rash. No stigmata of chronic liver disease Neuro:  A&O x 3.  Psych:  appropriate mood and  Affect.   Data Reviewed:   Chemistry      Component Value Date/Time   NA 138 04/25/2012 1059   NA 141 01/27/2012 1053   K 4.2 04/25/2012 1059   K 4.4 01/27/2012 1053   CL 105 04/25/2012 1059   CL 106 01/27/2012 1053   CO2 23 04/25/2012 1059   CO2 27 01/27/2012 1053   BUN 24.0 04/25/2012 1059   BUN 12 01/27/2012 1053   CREATININE 1.0 04/25/2012 1059   CREATININE 0.70 01/27/2012 1053   CREATININE 0.71 07/30/2011 1640      Component Value Date/Time   CALCIUM 9.3 04/25/2012 1059   CALCIUM 8.9 01/27/2012 1053   ALKPHOS 140 04/25/2012 1059   ALKPHOS 194* 01/27/2012 1053   AST 35* 04/25/2012 1059   AST 32 01/27/2012 1053   ALT 26 04/25/2012 1059   ALT 22 01/27/2012 1053   BILITOT 2.20* 04/25/2012 1059   BILITOT 0.7 01/27/2012 1053     October 29 labs show total bilirubin 1.8 with alkaline phosphatase 141, these were 152 and 1.8 in September. His transaminases as previously were  normal.  Ultrasound images and report reviewed, CT scan of the abdomen and pelvis images  and report reviewed. These are from this year and previously.  Primary care notes from Dr. Eric Form reviewed.  Colonoscopy and upper GI endoscopy reports, multiple reviewed as well as pathology reports.       Assessment & Plan:   1. Abnormal bilirubin test   2. Abnormal alkaline phosphatase test   3. Abnormal liver ultrasound   4. Non-small cell carcinoma of lung, stage 3   5. Personal history of adenomatous colonic polyps    1. He could have cirrhosis, I'm not convinced he does. He did seem to have somewhat of a lobular but very minimal change of the orders of the liver on his CT scan earlier this year. It is a bit unusual his transaminases are normal. 2. I think he could have had some sort of goes treatment phenomenon since he was radiated, and had chemotherapy. 3. I will recheck liver function tests today which will also allow for fractionation of his bilirubin. 4. Given his history of non-small cell lung cancer I think it's appropriate order an MRI of the liver we'll do that as well. 5. If he does have cirrhosis then vaccination for hepatitis A and B. makes sense. He is not immune to either of those. 6. I am not convinced a repeat routine colonoscopy makes sense at this time, but it could given his history of adenomatous colon polyps and having been 5 years since her last procedure. I want to wait this other workup first. I did not discuss this with him today but I will once these results are in.  I appreciate the opportunity to care for this patient.  CC: Kari Baars, MD, Si Gaul, MD, Dorothy Puffer, MD

## 2012-07-06 ENCOUNTER — Telehealth: Payer: Self-pay | Admitting: *Deleted

## 2012-07-06 NOTE — Telephone Encounter (Signed)
Pt's wife called stating that pt will be going to [place] to see Dr Dorthula Matas and they are requesting pathology slides that were done at at [facility].  Called pathology, form faxed that pt needs to fill out and fax back to pathology.  Form left at the front desk for pt to fill out and return back to pathology.  SLJ

## 2012-07-08 ENCOUNTER — Other Ambulatory Visit (HOSPITAL_COMMUNITY): Payer: Medicare Other

## 2012-07-13 ENCOUNTER — Encounter: Payer: Self-pay | Admitting: Internal Medicine

## 2012-07-13 ENCOUNTER — Telehealth: Payer: Self-pay

## 2012-07-13 NOTE — Telephone Encounter (Signed)
See my chart encounter.

## 2012-07-13 NOTE — Telephone Encounter (Signed)
Since [facility] Medicare initially denied the MRI, will Dr. Leone Payor submit further details justifying his position regarding the test? Also, if I don't have the MRI, should I call for an appointment to get the Type A & B Hepatitis vaccines, per Dr. Eilleen Kempf request?      Thank you.

## 2012-07-13 NOTE — Telephone Encounter (Signed)
Looks like this needed peer to peer, did you see in your in basket?

## 2012-07-15 ENCOUNTER — Telehealth: Payer: Self-pay | Admitting: Medical Oncology

## 2012-07-15 NOTE — Telephone Encounter (Signed)
Transferred call to radiology. Pt has appt dec 10th at [facility]

## 2012-07-22 ENCOUNTER — Telehealth: Payer: Self-pay

## 2012-07-22 DIAGNOSIS — C3490 Malignant neoplasm of unspecified part of unspecified bronchus or lung: Secondary | ICD-10-CM

## 2012-07-22 DIAGNOSIS — R748 Abnormal levels of other serum enzymes: Secondary | ICD-10-CM

## 2012-07-22 DIAGNOSIS — R7989 Other specified abnormal findings of blood chemistry: Secondary | ICD-10-CM

## 2012-07-22 DIAGNOSIS — R933 Abnormal findings on diagnostic imaging of other parts of digestive tract: Secondary | ICD-10-CM

## 2012-07-22 NOTE — Telephone Encounter (Signed)
Left message for patient to call back  

## 2012-07-22 NOTE — Telephone Encounter (Signed)
CT abdomen has been added on.  I have left a message for the patient to call back to discuss directions and contrast

## 2012-07-22 NOTE — Telephone Encounter (Signed)
Message copied by Annett Fabian on Fri Jul 22, 2012 11:08 AM ------      Message from: Stan Head E      Created: Thu Jul 21, 2012  4:57 PM       I talked to them and have decided to use CT [facility] - he has an upcoming chest CT with contrast (oncology has ordered for future) - please add an abdominal CT (no pelvis) to this re: abnormal alk phos and bilirubin, abnormal liver on [place] and he has stage 3 lung cancer (all that info needs to go in)            Please notify patient      ----- Message -----         From: Rossie Muskrat, RN,CGRN         Sent: 07/07/2012  11:53 AM           To: Iva Boop, MD            Ins has denies MRI.  Can you please do a Peer to Peer Review the info is below or let me know the next step.        ----- Message -----         From: Marcos Eke         Sent: 07/07/2012  11:05 AM           To: Rossie Muskrat, RN,CGRN            Denied for MRI of Liver W/WO            Pt needs a peer to peer to get approval.            [phone] Option 4

## 2012-07-26 NOTE — Telephone Encounter (Signed)
CT is under review with [employer], I have left a message for the patient to call back to discuss CT scheduled for next week

## 2012-07-31 ENCOUNTER — Encounter: Payer: Self-pay | Admitting: Internal Medicine

## 2012-08-01 ENCOUNTER — Telehealth: Payer: Self-pay | Admitting: Internal Medicine

## 2012-08-01 ENCOUNTER — Encounter (HOSPITAL_COMMUNITY): Payer: Self-pay

## 2012-08-01 ENCOUNTER — Ambulatory Visit (HOSPITAL_COMMUNITY)
Admission: RE | Admit: 2012-08-01 | Discharge: 2012-08-01 | Disposition: A | Discharge status: Home / Self Care | Payer: Medicare Other | Source: Ambulatory Visit | Attending: Internal Medicine | Admitting: Internal Medicine

## 2012-08-01 ENCOUNTER — Inpatient Hospital Stay (HOSPITAL_COMMUNITY)
Admission: RE | Admit: 2012-08-01 | Discharge: 2012-08-01 | Payer: Medicare Other | Source: Ambulatory Visit | Attending: Internal Medicine | Admitting: Internal Medicine

## 2012-08-01 ENCOUNTER — Other Ambulatory Visit (HOSPITAL_BASED_OUTPATIENT_CLINIC_OR_DEPARTMENT_OTHER): Payer: Medicare Other | Admitting: Lab

## 2012-08-01 DIAGNOSIS — C3410 Malignant neoplasm of upper lobe, unspecified bronchus or lung: Secondary | ICD-10-CM | POA: Insufficient documentation

## 2012-08-01 DIAGNOSIS — I714 Abdominal aortic aneurysm, without rupture, unspecified: Secondary | ICD-10-CM | POA: Insufficient documentation

## 2012-08-01 DIAGNOSIS — R933 Abnormal findings on diagnostic imaging of other parts of digestive tract: Secondary | ICD-10-CM | POA: Insufficient documentation

## 2012-08-01 DIAGNOSIS — R7989 Other specified abnormal findings of blood chemistry: Secondary | ICD-10-CM

## 2012-08-01 DIAGNOSIS — J438 Other emphysema: Secondary | ICD-10-CM | POA: Insufficient documentation

## 2012-08-01 DIAGNOSIS — R748 Abnormal levels of other serum enzymes: Secondary | ICD-10-CM

## 2012-08-01 DIAGNOSIS — C3490 Malignant neoplasm of unspecified part of unspecified bronchus or lung: Secondary | ICD-10-CM

## 2012-08-01 LAB — COMPREHENSIVE METABOLIC PANEL (CC13)
CO2: 25 mEq/L (ref 22–29)
Glucose: 93 mg/dl (ref 70–99)
Sodium: 143 mEq/L (ref 136–145)
Total Bilirubin: 1.21 mg/dL — ABNORMAL HIGH (ref 0.20–1.20)
Total Protein: 6.8 g/dL (ref 6.4–8.3)

## 2012-08-01 LAB — CBC WITH DIFFERENTIAL/PLATELET
Basophils Absolute: 0 10*3/uL (ref 0.0–0.1)
Eosinophils Absolute: 0.1 10*3/uL (ref 0.0–0.5)
HCT: 42.5 % (ref 38.4–49.9)
HGB: 14.5 g/dL (ref 13.0–17.1)
LYMPH%: 26.1 % (ref 14.0–49.0)
MONO#: 0.6 10*3/uL (ref 0.1–0.9)
NEUT#: 3 10*3/uL (ref 1.5–6.5)
NEUT%: 60.3 % (ref 39.0–75.0)
Platelets: 139 10*3/uL — ABNORMAL LOW (ref 140–400)
RBC: 4.26 10*6/uL (ref 4.20–5.82)
WBC: 5 10*3/uL (ref 4.0–10.3)

## 2012-08-01 MED ORDER — IOHEXOL 300 MG/ML  SOLN
100.0000 mL | Freq: Once | INTRAMUSCULAR | Status: AC | PRN
  Administered 2012-08-01: 100 mL via INTRAVENOUS

## 2012-08-01 NOTE — Telephone Encounter (Signed)
Patient will come pick up contrast this am for CT at 12

## 2012-08-01 NOTE — Telephone Encounter (Signed)
Patient is aware of CT he will come pick up contrast today

## 2012-08-02 NOTE — Progress Notes (Signed)
Quick Note:  CT suggests mild cirrhosis - no liver lesions (metastases) - good news Reasonable to get the hepatitis A and B vaccines (think he was going to do with Dr. Clelia Croft - PCP) Liver seems to be working ok  It is not clear to me if it makes sense to repeat a colonoscopy given hx of polyps  I ask that he schedule a non-urgent follow-up visit and we can discuss things  Cc: Dr. Eric Form please ______

## 2012-08-03 ENCOUNTER — Ambulatory Visit (HOSPITAL_BASED_OUTPATIENT_CLINIC_OR_DEPARTMENT_OTHER): Payer: Medicare Other | Admitting: Internal Medicine

## 2012-08-03 ENCOUNTER — Telehealth: Payer: Self-pay | Admitting: Internal Medicine

## 2012-08-03 VITALS — BP 127/79 | HR 82 | Temp 96.7°F | Resp 20 | Ht 71.5 in | Wt 201.9 lb

## 2012-08-03 DIAGNOSIS — C3410 Malignant neoplasm of upper lobe, unspecified bronchus or lung: Secondary | ICD-10-CM

## 2012-08-03 NOTE — Patient Instructions (Signed)
Your scan showed no evidence for disease progression. Followup in 3 months with repeat CT scan of the chest.

## 2012-08-03 NOTE — Telephone Encounter (Signed)
Appts made and printed for pt  aom 

## 2012-08-03 NOTE — Progress Notes (Signed)
[facility] Telephone:([phone]   Fax:(336) [phone]  OFFICE PROGRESS NOTE  Kari Baars, MD [address] [facility], [place]. [place] [address]  DIAGNOSIS: Stage IIIa/IIIB non-small cell lung cancer   PRIOR THERAPY:  1) Concurrent chemoradiation with chemotherapy in the form of weekly carboplatin for an AUC of 2 and paclitaxel at 45 mg per meter squared.  2) Consolidation chemotherapy with carboplatin for AUC of 5 and paclitaxel 175 mg/M2 every 3 weeks with Neulasta support, status post 3 cycles, last cycle was given on 01/05/2012 with partial response.   CURRENT THERAPY: Observation.  INTERVAL HISTORY: Joseph [name] 71 y.o. male returns to the clinic today for followup visit accompanied his wife. The patient is feeling fine today with no specific complaints. He was seen recently by Dr. Elenore Paddy for evaluation of hyper bilirubinemia which currently close to the normal range. He denied having any significant chest pain, shortness breath, cough or hemoptysis. He initially lost a few pounds since his last visit. The patient had repeat CT scan of the chest, and abdomen recently and he is here today for evaluation and discussion of his scan results.  MEDICAL HISTORY: Past Medical History  Diagnosis Date  . History of colon polyps 2006,2008  . 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 Hyperlipidemia     takes Pravastatin  . Emphysema   . Lung mass     right upper lobe  . Shortness of breath     with exertion  . Loose stools   . Enlarged prostate     takes Rapaflo daily  . Nocturia   . Arthritis     left knee and back arthrtis  . Hypothyroidism     takes Synthroid daily  . Cataract   . Hx of radiation therapy 09/02/11 to 10/19/11    chest  . Diverticulosis   . Lung cancer dx'd 07/2011    ALLERGIES:   has no known allergies.  MEDICATIONS:  Current Outpatient Prescriptions  Medication Sig Dispense Refill  . HYDROcodone-acetaminophen  (NORCO) 5-325 MG per tablet Take 1 tablet by mouth every 6 (six) hours as needed for pain.  30 tablet  0  . lidocaine-prilocaine (EMLA) cream Apply 1 application topically as needed.        . loperamide (IMODIUM A-D) 2 MG tablet Take 2 mg by mouth 4 (four) times daily as needed.      . Misc Natural Products (PROSTATE HEALTH) CAPS Take by mouth.        . Multiple Vitamins-Minerals (MULTIVITAMINS THER. W/MINERALS) TABS Take 1 tablet by mouth daily.        Marland Kitchen nystatin-triamcinolone (MYCOLOG II) cream Apply 1 application topically daily as needed.       . prochlorperazine (COMPAZINE) 10 MG tablet Take 1 tablet (10 mg total) by mouth every 6 (six) hours as needed.  30 tablet  0  . RAPAFLO 8 MG CAPS capsule Take 8 mg by mouth daily with breakfast.       . SYNTHROID 175 MCG tablet Take 150 mcg by mouth daily.       Marland Kitchen tiotropium (SPIRIVA) 18 MCG inhalation capsule Place 18 mcg into inhaler and inhale daily.      . celecoxib (CELEBREX) 200 MG capsule Take 200 mg by mouth 2 (two) times daily as needed.         SURGICAL HISTORY:  Past Surgical History  Procedure Date  . Hernia repair 70's  .  Vasectomy 70's  . Finger surgery 2008    left pointer finger  . Knee arthroscopy 2008    left knee  . Tonsillectomy     as a child  . Colonoscopy w/ biopsies     multiple   . Cardiac catheterization 2003  . Esophagogastroduodenoscopy   . Portacath placement 08/10/2011    Procedure: INSERTION PORT-A-CATH;  Surgeon: Norton Blizzard, MD;  Location: [facility] OR;  Service: Thoracic;  Laterality: Left;  Left Subclavian    REVIEW OF SYSTEMS:  A comprehensive review of systems was negative.   PHYSICAL EXAMINATION: General appearance: alert, cooperative and no distress Head: Normocephalic, without obvious abnormality, atraumatic Neck: no adenopathy Lymph nodes: Cervical, supraclavicular, and axillary nodes normal. Resp: clear to auscultation bilaterally Cardio: regular rate and rhythm, S1, S2 normal, no murmur,  click, rub or gallop GI: soft, non-tender; bowel sounds normal; no masses,  no organomegaly Extremities: extremities normal, atraumatic, no cyanosis or edema  ECOG PERFORMANCE STATUS: 0 - Asymptomatic  Blood pressure 127/79, pulse 82, temperature 96.7 F (35.9 C), temperature source Oral, resp. rate 20, height 5' 11.5" (1.816 m), weight 201 lb 14.4 oz (91.581 kg).  LABORATORY DATA: Lab Results  Component Value Date   WBC 5.0 08/01/2012   HGB 14.5 08/01/2012   HCT 42.5 08/01/2012   MCV 99.8* 08/01/2012   PLT 139* 08/01/2012      Chemistry      Component Value Date/Time   NA 143 08/01/2012 1046   NA 141 01/27/2012 1053   K 4.2 08/01/2012 1046   K 4.4 01/27/2012 1053   CL 109* 08/01/2012 1046   CL 106 01/27/2012 1053   CO2 25 08/01/2012 1046   CO2 27 01/27/2012 1053   BUN 15.0 08/01/2012 1046   BUN 12 01/27/2012 1053   CREATININE 0.8 08/01/2012 1046   CREATININE 0.70 01/27/2012 1053   CREATININE 0.71 07/30/2011 1640      Component Value Date/Time   CALCIUM 8.9 08/01/2012 1046   CALCIUM 8.9 01/27/2012 1053   ALKPHOS 172* 08/01/2012 1046   ALKPHOS 137* 07/04/2012 1220   AST 29 08/01/2012 1046   AST 39* 07/04/2012 1220   ALT 27 08/01/2012 1046   ALT 32 07/04/2012 1220   BILITOT 1.21* 08/01/2012 1046   BILITOT 1.6* 07/04/2012 1220       RADIOGRAPHIC STUDIES: Ct Chest W Contrast  08/01/2012  *RADIOLOGY REPORT*  Clinical Data:  Restaging lung cancer.  Radiation and chemotherapy completed 4 months ago.  Shortness of breath.  Elevated bilirubin level.  CT CHEST AND ABDOMEN WITH CONTRAST  Technique:  Multidetector CT imaging of the chest and abdomen pelvis was performed following the standard protocol during bolus administration of intravenous contrast.  Contrast: OMNIPAQUE IOHEXOL 300 MG/ML  SOLN  Comparison:  Prior CTs 04/25/2012 and 01/26/2012.  Abdominal ultrasound 06/01/2012.   CT CHEST  Findings:  The left subclavian Port-A-Cath tip is unchanged within the mid SVC.  There may be a small  fibrin sheath adjacent to the catheter tip.  There is no large vessel occlusion.  Small mediastinal lymph nodes are unchanged.  There are no pathologically enlarged mediastinal or hilar lymph nodes.  Coronary artery calcifications are noted.  The spiculated right upper lobe lesion does not appear significantly changed, measuring 13 x 6 mm on image 19.  The nodular density along the right minor fissure appears slightly more prominent, measuring 9 mm on image 36. No change is less apparent on the reformatted images, and this finding  has been present on multiple prior studies.  No new pulmonary nodules are identified. There are diffuse emphysematous changes.  IMPRESSION:  1.  Overall no significant change in spiculated right upper lobe nodule and smooth oval nodule along the minor fissure. 2.  Stable small mediastinal lymph nodes.  No evidence of disease progression. 3.  Diffuse emphysema.   CT ABDOMEN  Findings:  There is stable mild contour irregularity of the liver. No focal hepatic lesions are identified.  The gallbladder, biliary system and pancreas appear unchanged.  There is stable mild dilatation of the portal vein near the bifurcation.  The spleen, adrenal glands and kidneys are unchanged.  There are small renal parapelvic cysts.  There is no hydronephrosis.  A 5.5 cm abdominal aortic aneurysm is noted, slightly enlarged compared with the prior examination. Mild associated mural thrombus appears unchanged.  This is incompletely visualized on this examination which does not include the pelvis.  There is no retroperitoneal hematoma.  There are no enlarged abdominal lymph nodes.  The abdominal bowel gas pattern appears normal.  There are no suspicious osseous findings.  IMPRESSION:  1.  No evidence of abdominal metastatic disease. 2.  Stable contour irregularity of the liver suspicious for mild cirrhosis.  No focal hepatic abnormality. 3.  Slight interval enlargement of distal abdominal aortic aneurysm.    Original Report Authenticated By: Carey Bullocks, M.D.     ASSESSMENT: This is a very pleasant 71 years old white male with history of stage IIIa non-small cell lung cancer status post concurrent chemoradiation followed by 3 cycles of consolidation chemotherapy with carboplatin and paclitaxel with partial response. The patient is doing fine and he has no evidence for disease progression on his recent scan.  PLAN: I discussed the scan results with the patient and his wife. I recommended for him to continue on observation for now with repeat CT scan of the chest in 3 months. The patient is going to [facility] for second opinion regarding his disease based on his daughter's request. We will forward all the records needed for this patient to go for this appointment. He was advised to call immediately if he has any concerning symptoms in the interval.  All questions were answered. The patient knows to call the clinic with any problems, questions or concerns. We can certainly see the patient much sooner if necessary.

## 2012-08-11 ENCOUNTER — Telehealth: Payer: Self-pay | Admitting: Internal Medicine

## 2012-08-11 NOTE — Telephone Encounter (Signed)
Left message for patient to call back  

## 2012-08-11 NOTE — Telephone Encounter (Signed)
Patient wants to know if Twinrix will be covered by his new Medicare plan.  I have asked him to contact them for plan specifics, as I am unaware of his plans specifics.  He is asked to keep his follow up for January.

## 2012-08-12 ENCOUNTER — Telehealth: Payer: Self-pay | Admitting: Internal Medicine

## 2012-08-12 NOTE — Telephone Encounter (Signed)
Patient is starting on Tamsulosin 0.4 mg QD, prescribed by Dr. Isabel Caprice.  It has warnings with history of liver disease.  Is it ok for him to start on this?

## 2012-08-12 NOTE — Telephone Encounter (Signed)
Yes- his liver is working ok

## 2012-08-15 NOTE — Telephone Encounter (Signed)
Patient advised.

## 2012-09-06 ENCOUNTER — Ambulatory Visit (HOSPITAL_BASED_OUTPATIENT_CLINIC_OR_DEPARTMENT_OTHER): Payer: Medicare Other

## 2012-09-06 VITALS — BP 131/88 | HR 90 | Temp 96.8°F | Resp 20

## 2012-09-06 DIAGNOSIS — Z452 Encounter for adjustment and management of vascular access device: Secondary | ICD-10-CM

## 2012-09-06 DIAGNOSIS — C3410 Malignant neoplasm of upper lobe, unspecified bronchus or lung: Secondary | ICD-10-CM

## 2012-09-06 DIAGNOSIS — C3490 Malignant neoplasm of unspecified part of unspecified bronchus or lung: Secondary | ICD-10-CM

## 2012-09-06 MED ORDER — HEPARIN SOD (PORK) LOCK FLUSH 100 UNIT/ML IV SOLN
500.0000 [IU] | Freq: Once | INTRAVENOUS | Status: AC
  Administered 2012-09-06: 500 [IU] via INTRAVENOUS
  Filled 2012-09-06: qty 5

## 2012-09-06 MED ORDER — SODIUM CHLORIDE 0.9 % IJ SOLN
10.0000 mL | INTRAMUSCULAR | Status: DC | PRN
  Administered 2012-09-06: 10 mL via INTRAVENOUS
  Filled 2012-09-06: qty 10

## 2012-09-07 ENCOUNTER — Encounter: Payer: Self-pay | Admitting: Internal Medicine

## 2012-09-07 ENCOUNTER — Ambulatory Visit (INDEPENDENT_AMBULATORY_CARE_PROVIDER_SITE_OTHER): Payer: Medicare Other | Admitting: Internal Medicine

## 2012-09-07 VITALS — BP 106/64 | HR 86 | Ht 72.0 in | Wt 204.0 lb

## 2012-09-07 DIAGNOSIS — Z86010 Personal history of colonic polyps: Secondary | ICD-10-CM

## 2012-09-07 DIAGNOSIS — K7460 Unspecified cirrhosis of liver: Secondary | ICD-10-CM

## 2012-09-07 DIAGNOSIS — C3490 Malignant neoplasm of unspecified part of unspecified bronchus or lung: Secondary | ICD-10-CM

## 2012-09-07 DIAGNOSIS — Z23 Encounter for immunization: Secondary | ICD-10-CM

## 2012-09-07 NOTE — Progress Notes (Signed)
Subjective:    Patient ID: Joseph [name], male    DOB: Feb 10, 1941, 72 y.o.   MRN: [id number]  HPI The patient returns for followup, he has a diagnosis of cirrhosis made on CT scanning. The first CT scan showed a lobular liver, mild in the summer. We're going to try to get an MRI for more definitive diagnosis and ended up with a CT again because of issues, but he had a triple phase CT that demonstrated a mildly lobular contour of the liver without mass lesions and no significant change over about 5 or 6 months. He feels well overall. He is in good spirits. His lung cancer is in remission at this point, there is a stable spiculated nodule. He has plans to have scans again in March of this year. He is ready to have hepatitis A and B. vaccine. No Known Allergies Outpatient Prescriptions Prior to Visit  Medication Sig Dispense Refill  . celecoxib (CELEBREX) 200 MG capsule Take 200 mg by mouth 2 (two) times daily as needed.       Marland Kitchen HYDROcodone-acetaminophen (NORCO) 5-325 MG per tablet Take 1 tablet by mouth every 6 (six) hours as needed for pain.  30 tablet  0  . lidocaine-prilocaine (EMLA) cream Apply 1 application topically as needed.        . loperamide (IMODIUM A-D) 2 MG tablet Take 2 mg by mouth 4 (four) times daily as needed.      . Misc Natural Products (PROSTATE HEALTH) CAPS Take by mouth.        . Multiple Vitamins-Minerals (MULTIVITAMINS THER. W/MINERALS) TABS Take 1 tablet by mouth daily.        Marland Kitchen nystatin-triamcinolone (MYCOLOG II) cream Apply 1 application topically daily as needed.       . prochlorperazine (COMPAZINE) 10 MG tablet Take 1 tablet (10 mg total) by mouth every 6 (six) hours as needed.  30 tablet  0  . SYNTHROID 175 MCG tablet Take 150 mcg by mouth daily.       Marland Kitchen tiotropium (SPIRIVA) 18 MCG inhalation capsule Place 18 mcg into inhaler and inhale daily.      . [DISCONTINUED] RAPAFLO 8 MG CAPS capsule Take 8 mg by mouth daily with breakfast.        Last reviewed on  09/07/2012  2:04 PM by Iva Boop, MD Past Medical History  Diagnosis Date  . History of colon polyps 2006,2008  . 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 Hyperlipidemia     takes Pravastatin  . Emphysema   . Lung cancer     right upper lobe  . Enlarged prostate     takes Rapaflo daily  . Arthritis     left knee and back arthrtis  . Hypothyroidism     takes Synthroid daily  . Cataract   . Hx of radiation therapy 09/02/11 to 10/19/11    chest  . Diverticulosis   . Cirrhosis     By radiography, seen on CT   Past Surgical History  Procedure Date  . Hernia repair 70's  . Vasectomy 70's  . Finger surgery 2008    left pointer finger  . Knee arthroscopy 2008    left knee  . Tonsillectomy     as a child  . Colonoscopy w/ biopsies     multiple   . Cardiac catheterization 2003  . Esophagogastroduodenoscopy 2006  . Portacath placement 08/10/2011    Procedure: INSERTION PORT-A-CATH;  Surgeon: Norton Blizzard, MD;  Location: [facility] OR;  Service: Thoracic;  Laterality: Left;  Left Subclavian    Review of Systems As above    Objective:   Physical Exam Well-developed nourished no acute distress    Assessment & Plan:   1. Cirrhosis   2. Personal history of adenomatous colonic polyps with last colonoscopy showing an adenoma in late 2008   3. Lung cancer , stage III a in remission    1. It is appropriate for him to have a screening endoscopy to see if he has esophageal varices. He is willing to do this but wishes to defer until his cancer followup scans in March are completed. 2. He also is appropriate for a repeat screening and surveillance colonoscopy. I reviewed with his oncologist who thought that that was appropriate he denies a diagnosis of lung cancer he is in remission. The risks and benefits as well as alternatives of endoscopic procedure(s) have been discussed and reviewed. All questions answered. The patient agrees to proceed.  Patient plans to followup by calling, and arrange  his EGD and colonoscopy. He should just need to come in for pre-visit. We discussed that the etiology of his cirrhosis unclear, he certainly could of had nonalcoholic fatty liver over the years. He used to be heavy or. At one point there did seem to be some fatty liver changes suggested on imaging. Will proceed with hepatitis A and B. vaccines today. He says these had an influenza vaccine this year, he is also had a pneumonia vaccine at some point in the last couple of years she says. Deferred Dr. Clelia Croft for these other vaccinations. 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 It does not seem that he is a significant surgical risk as I think he has fairly well compensated liver disease at this point. That could change with time however we could revisit. Obviously he has lung cancer diagnosis effects the intervention on his aneurysm as well though fortunately he has had good news there as well.  I appreciate the opportunity to care for this patient.  CC: Kari Baars, MD

## 2012-09-07 NOTE — Patient Instructions (Addendum)
Call us back to set up a pre-visit and procedure dates for Endoscopy and Colonoscopy.  You have been given a handout to read on cirrhosis.   Today you have started TwinRix injections.  Next one is due January 15th 10:30am and the last one January 30th 10:30am and a booster in a year.  Thank you for choosing me and [facility].  Iva Boop, M.D., [facility]

## 2012-09-14 ENCOUNTER — Ambulatory Visit (INDEPENDENT_AMBULATORY_CARE_PROVIDER_SITE_OTHER): Payer: Medicare Other | Admitting: Internal Medicine

## 2012-09-14 DIAGNOSIS — K7460 Unspecified cirrhosis of liver: Secondary | ICD-10-CM

## 2012-09-14 DIAGNOSIS — Z23 Encounter for immunization: Secondary | ICD-10-CM

## 2012-09-29 ENCOUNTER — Ambulatory Visit (INDEPENDENT_AMBULATORY_CARE_PROVIDER_SITE_OTHER): Payer: Medicare Other | Admitting: Internal Medicine

## 2012-09-29 DIAGNOSIS — Z23 Encounter for immunization: Secondary | ICD-10-CM

## 2012-10-15 ENCOUNTER — Other Ambulatory Visit: Payer: Self-pay

## 2012-10-18 ENCOUNTER — Ambulatory Visit (HOSPITAL_BASED_OUTPATIENT_CLINIC_OR_DEPARTMENT_OTHER): Payer: Medicare Other

## 2012-10-18 VITALS — BP 115/68 | HR 90 | Temp 97.3°F

## 2012-10-18 DIAGNOSIS — Z452 Encounter for adjustment and management of vascular access device: Secondary | ICD-10-CM

## 2012-10-18 DIAGNOSIS — C3490 Malignant neoplasm of unspecified part of unspecified bronchus or lung: Secondary | ICD-10-CM

## 2012-10-18 DIAGNOSIS — C3410 Malignant neoplasm of upper lobe, unspecified bronchus or lung: Secondary | ICD-10-CM

## 2012-10-18 MED ORDER — SODIUM CHLORIDE 0.9 % IJ SOLN
10.0000 mL | INTRAMUSCULAR | Status: DC | PRN
  Administered 2012-10-18: 10 mL via INTRAVENOUS
  Filled 2012-10-18: qty 10

## 2012-10-18 MED ORDER — HEPARIN SOD (PORK) LOCK FLUSH 100 UNIT/ML IV SOLN
500.0000 [IU] | Freq: Once | INTRAVENOUS | Status: AC
  Administered 2012-10-18: 500 [IU] via INTRAVENOUS
  Filled 2012-10-18: qty 5

## 2012-10-24 ENCOUNTER — Telehealth: Payer: Self-pay | Admitting: *Deleted

## 2012-10-24 NOTE — Telephone Encounter (Signed)
Progress note from Dr. Victory Dakin at [facility] given to Dr Donnald Garre to review.  Will be scanned into EPIC.  SLJ

## 2012-11-01 ENCOUNTER — Other Ambulatory Visit (HOSPITAL_BASED_OUTPATIENT_CLINIC_OR_DEPARTMENT_OTHER): Payer: Medicare Other

## 2012-11-01 DIAGNOSIS — C3410 Malignant neoplasm of upper lobe, unspecified bronchus or lung: Secondary | ICD-10-CM

## 2012-11-01 DIAGNOSIS — C3490 Malignant neoplasm of unspecified part of unspecified bronchus or lung: Secondary | ICD-10-CM

## 2012-11-01 LAB — COMPREHENSIVE METABOLIC PANEL WITH GFR
ALT: 21 U/L (ref 0–53)
AST: 28 U/L (ref 0–37)
Albumin: 3.2 g/dL — ABNORMAL LOW (ref 3.5–5.2)
Alkaline Phosphatase: 148 U/L — ABNORMAL HIGH (ref 39–117)
BUN: 12 mg/dL (ref 6–23)
CO2: 23 meq/L (ref 19–32)
Calcium: 8.8 mg/dL (ref 8.4–10.5)
Chloride: 107 meq/L (ref 96–112)
Creatinine, Ser: 0.85 mg/dL (ref 0.50–1.35)
Glucose, Bld: 85 mg/dL (ref 70–99)
Potassium: 4.2 meq/L (ref 3.5–5.3)
Sodium: 140 meq/L (ref 135–145)
Total Bilirubin: 1.1 mg/dL (ref 0.3–1.2)
Total Protein: 7 g/dL (ref 6.0–8.3)

## 2012-11-01 LAB — CBC WITH DIFFERENTIAL/PLATELET
Eosinophils Absolute: 0.1 10*3/uL (ref 0.0–0.5)
MONO#: 0.4 10*3/uL (ref 0.1–0.9)
NEUT#: 2.6 10*3/uL (ref 1.5–6.5)
Platelets: 135 10*3/uL — ABNORMAL LOW (ref 140–400)
RBC: 4.47 10*6/uL (ref 4.20–5.82)
RDW: 13.8 % (ref 11.0–14.6)
WBC: 4.4 10*3/uL (ref 4.0–10.3)

## 2012-11-02 ENCOUNTER — Encounter (HOSPITAL_COMMUNITY): Payer: Self-pay

## 2012-11-02 ENCOUNTER — Ambulatory Visit (HOSPITAL_COMMUNITY)
Admission: RE | Admit: 2012-11-02 | Discharge: 2012-11-02 | Disposition: A | Discharge status: Home / Self Care | Payer: Medicare Other | Source: Ambulatory Visit | Attending: Internal Medicine | Admitting: Internal Medicine

## 2012-11-02 DIAGNOSIS — Z9221 Personal history of antineoplastic chemotherapy: Secondary | ICD-10-CM | POA: Insufficient documentation

## 2012-11-02 DIAGNOSIS — Z923 Personal history of irradiation: Secondary | ICD-10-CM | POA: Insufficient documentation

## 2012-11-02 DIAGNOSIS — C3410 Malignant neoplasm of upper lobe, unspecified bronchus or lung: Secondary | ICD-10-CM | POA: Insufficient documentation

## 2012-11-02 DIAGNOSIS — R911 Solitary pulmonary nodule: Secondary | ICD-10-CM | POA: Insufficient documentation

## 2012-11-02 MED ORDER — IOHEXOL 300 MG/ML  SOLN
100.0000 mL | Freq: Once | INTRAMUSCULAR | Status: AC | PRN
  Administered 2012-11-02: 100 mL via INTRAVENOUS

## 2012-11-03 ENCOUNTER — Ambulatory Visit (HOSPITAL_BASED_OUTPATIENT_CLINIC_OR_DEPARTMENT_OTHER): Payer: Medicare Other | Admitting: Internal Medicine

## 2012-11-03 ENCOUNTER — Telehealth: Payer: Self-pay | Admitting: Internal Medicine

## 2012-11-03 ENCOUNTER — Encounter: Payer: Self-pay | Admitting: Internal Medicine

## 2012-11-03 DIAGNOSIS — C3410 Malignant neoplasm of upper lobe, unspecified bronchus or lung: Secondary | ICD-10-CM

## 2012-11-03 NOTE — Progress Notes (Signed)
[name] Telephone:(336) [phone]   Fax:(336) [phone]  OFFICE PROGRESS NOTE  Joseph Baars, MD [address] [facility], [place]. [place] [address]  DIAGNOSIS: Stage IIIa/IIIB non-small cell lung cancer   PRIOR THERAPY:  1) Concurrent chemoradiation with chemotherapy in the form of weekly carboplatin for an AUC of 2 and paclitaxel at 45 mg per meter squared.  2) Consolidation chemotherapy with carboplatin for AUC of 5 and paclitaxel 175 mg/M2 every 3 weeks with Neulasta support, status post 3 cycles, last cycle was given on 01/05/2012 with partial response.   CURRENT THERAPY: Observation.  INTERVAL HISTORY: Joseph [name] 72 y.o. male returns to the clinic today for followup visit accompanied his wife. The patient is feeling fine today with no specific complaints. He denied having any significant chest pain, shortness breath, cough or hemoptysis. He denied having any weight loss or night sweats. The patient had repeat CT scan of the chest performed recently and he is here for evaluation and discussion of his scan results.  MEDICAL HISTORY: Past Medical History  Diagnosis Date  . History of colon polyps 2006,2008  . 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 Hyperlipidemia     takes Pravastatin  . Emphysema   . Lung cancer     right upper lobe  . Enlarged prostate     takes Rapaflo daily  . Arthritis     left knee and back arthrtis  . Hypothyroidism     takes Synthroid daily  . Cataract   . Hx of radiation therapy 09/02/11 to 10/19/11    chest  . Diverticulosis   . Cirrhosis     By radiography, seen on CT    ALLERGIES:  has No Known Allergies.  MEDICATIONS:  Current Outpatient Prescriptions  Medication Sig Dispense Refill  . celecoxib (CELEBREX) 200 MG capsule Take 200 mg by mouth 2 (two) times daily as needed.       Marland Kitchen HYDROcodone-acetaminophen (NORCO) 5-325 MG per tablet Take 1 tablet by mouth every 6 (six) hours as needed for  pain.  30 tablet  0  . lidocaine-prilocaine (EMLA) cream Apply 1 application topically as needed.        . loperamide (IMODIUM A-D) 2 MG tablet Take 2 mg by mouth 4 (four) times daily as needed.      . Misc Natural Products (PROSTATE HEALTH) CAPS Take by mouth.        . Multiple Vitamins-Minerals (MULTIVITAMINS THER. W/MINERALS) TABS Take 1 tablet by mouth daily.        Marland Kitchen nystatin-triamcinolone (MYCOLOG II) cream Apply 1 application topically daily as needed.       Marland Kitchen SYNTHROID 175 MCG tablet Take 150 mcg by mouth daily.       . tamsulosin (FLOMAX) 0.4 MG CAPS Take 0.4 mg by mouth daily.      Marland Kitchen tiotropium (SPIRIVA) 18 MCG inhalation capsule Place 18 mcg into inhaler and inhale daily.       No current facility-administered medications for this visit.    SURGICAL HISTORY:  Past Surgical History  Procedure Laterality Date  . Hernia repair  70's  . Vasectomy  70's  . Finger surgery  2008    left pointer finger  . Knee arthroscopy  2008    left knee  . Tonsillectomy      as a child  . Colonoscopy w/ biopsies      multiple   . Cardiac catheterization  2003  .  Esophagogastroduodenoscopy  2006  . Portacath placement  08/10/2011    Procedure: INSERTION PORT-A-CATH;  Surgeon: Norton Blizzard, MD;  Location: [facility] OR;  Service: Thoracic;  Laterality: Left;  Left Subclavian    REVIEW OF SYSTEMS:  A comprehensive review of systems was negative.   PHYSICAL EXAMINATION: General appearance: alert, cooperative and no distress Head: Normocephalic, without obvious abnormality, atraumatic Neck: no adenopathy Resp: clear to auscultation bilaterally Cardio: regular rate and rhythm, S1, S2 normal, no murmur, click, rub or gallop GI: soft, non-tender; bowel sounds normal; no masses,  no organomegaly Extremities: extremities normal, atraumatic, no cyanosis or edema Neurologic: Alert and oriented X 3, normal strength and tone. Normal symmetric reflexes. Normal coordination and gait  ECOG PERFORMANCE  STATUS: 0 - Asymptomatic  Blood pressure 141/86, pulse 94, temperature 97.8 F (36.6 C), temperature source Oral, weight 208 lb 12.8 oz (94.711 kg).  LABORATORY DATA: Lab Results  Component Value Date   WBC 4.4 11/01/2012   HGB 15.1 11/01/2012   HCT 43.5 11/01/2012   MCV 97.3 11/01/2012   PLT 135* 11/01/2012      Chemistry      Component Value Date/Time   NA 140 11/01/2012 1142   NA 143 08/01/2012 1046   K 4.2 11/01/2012 1142   K 4.2 08/01/2012 1046   CL 107 11/01/2012 1142   CL 109* 08/01/2012 1046   CO2 23 11/01/2012 1142   CO2 25 08/01/2012 1046   BUN 12 11/01/2012 1142   BUN 15.0 08/01/2012 1046   CREATININE 0.85 11/01/2012 1142   CREATININE 0.8 08/01/2012 1046   CREATININE 0.71 07/30/2011 1640      Component Value Date/Time   CALCIUM 8.8 11/01/2012 1142   CALCIUM 8.9 08/01/2012 1046   ALKPHOS 148* 11/01/2012 1142   ALKPHOS 172* 08/01/2012 1046   AST 28 11/01/2012 1142   AST 29 08/01/2012 1046   ALT 21 11/01/2012 1142   ALT 27 08/01/2012 1046   BILITOT 1.1 11/01/2012 1142   BILITOT 1.21* 08/01/2012 1046       RADIOGRAPHIC STUDIES: Ct Chest W Contrast  11/02/2012  *RADIOLOGY REPORT*  Clinical Data: Follow-up lung cancer.  Chemotherapy and radiation therapy complete.  CT CHEST WITH CONTRAST  Technique:  Multidetector CT imaging of the chest was performed following the standard protocol during bolus administration of intravenous contrast.  Contrast: OMNIPAQUE IOHEXOL 300 MG/ML  SOLN  Comparison: 08/01/2012, 11/17/2011 and 07/20/2011.  Findings: Mediastinal lymph nodes are not enlarged by CT size criteria and are unchanged.  No pathologically enlarged hilar or axillary lymph nodes.  Atherosclerotic calcification of the arterial vasculature, including coronary arteries.  Heart size normal.  No pericardial effusion.  Fairly severe centrilobular and paraseptal emphysema. Spiculated nodular density in the right upper lobe measures 13 x 7 mm, unchanged, and is surrounded by retraction and architectural  distortion with associated volume loss.  The latter findings are progressive from 08/01/2012.  A 9 mm nodule along the right major fissure is unchanged.  Left lung is clear.  No pleural fluid. Airway is unremarkable.  Incidental imaging of the upper abdomen shows irregularity of the liver margin.  No worrisome lytic or sclerotic lesions.  IMPRESSION:  1.  Stable spiculated nodule in the right upper lobe with progressive surrounding architectural distortion and retractile parenchymal opacity, presumably related to radiation therapy. 2.  Cirrhosis.   Original Report Authenticated By: Leanna Battles, M.D.     ASSESSMENT: This is a very pleasant 72 years old white male with history  of stage IIIB non-small cell lung cancer status post concurrent chemoradiation followed by consolidation chemotherapy and has been observation since may of 2013 with no evidence for disease progression.   PLAN: I discussed the scan results with the patient and his wife. I recommended for him to continue on observation with repeat CT scan of the chest with contrast in 3 months. The patient was advised to call me immediately if he has any concerning symptoms in the interval.  All questions were answered. The patient knows to call the clinic with any problems, questions or concerns. We can certainly see the patient much sooner if necessary.

## 2012-11-03 NOTE — Telephone Encounter (Signed)
Gave pt appt for lab before CT then see MD on July 2014,

## 2012-11-08 ENCOUNTER — Encounter: Payer: Self-pay | Admitting: Internal Medicine

## 2012-11-28 ENCOUNTER — Telehealth: Payer: Self-pay | Admitting: Internal Medicine

## 2012-11-28 NOTE — Telephone Encounter (Signed)
Per Dr. Jamelle Haring last recommendation on CT scan 07/22/12, he wanted to see the patient in the office prior to scheduling a CT  I have left a message for the patient to call back

## 2012-11-30 NOTE — Telephone Encounter (Signed)
Spoke with patient's wife and scheduled patient on 12/02/12 at 2:15 PM with Dr. Leone Payor.

## 2012-11-30 NOTE — Telephone Encounter (Signed)
If you cant reach pt on home phone try [phone]

## 2012-11-30 NOTE — Telephone Encounter (Signed)
Patient is calling back  °

## 2012-12-02 ENCOUNTER — Encounter: Payer: Self-pay | Admitting: Internal Medicine

## 2012-12-02 ENCOUNTER — Ambulatory Visit (INDEPENDENT_AMBULATORY_CARE_PROVIDER_SITE_OTHER): Payer: Medicare Other | Admitting: Internal Medicine

## 2012-12-02 VITALS — BP 124/60 | HR 81 | Ht 71.0 in | Wt 209.0 lb

## 2012-12-02 DIAGNOSIS — Z1211 Encounter for screening for malignant neoplasm of colon: Secondary | ICD-10-CM

## 2012-12-02 DIAGNOSIS — Z86010 Personal history of colonic polyps: Secondary | ICD-10-CM

## 2012-12-02 DIAGNOSIS — K7460 Unspecified cirrhosis of liver: Secondary | ICD-10-CM

## 2012-12-02 NOTE — Progress Notes (Signed)
  Subjective:    Patient ID: Joseph [name], male    DOB: 19-Oct-1940, 72 y.o.   MRN: [id number]  HPI The patient returns for follow-up. He has cirrhosis per CT w/ negative serologic work-up Stable on CT and lung cancer still in remission. Also has hx adenomatous polyps w/ last colonoscopy 2008. Doing well overall.  Medications, allergies, past medical history, past surgical history, family history and social history are reviewed and updated in the EMR.  Review of Systems As above    Objective:   Physical Exam General:  NAD Eyes:   anicteric Lungs:  clear Heart:  S1S2 no rubs, murmurs or gallops    Data Reviewed:  Previous colonoscopies and pathology Last CT chest 10/2012        Assessment & Plan:  Cirrhosis of liver without mention of alcohol  Personal history of colonic polyps  Special screening for malignant neoplasms, colon  1. Schedule EGD to screen for varices 2. Schedule screening/surveillance colonoscopy The risks and benefits as well as alternatives of endoscopic procedure(s) have been discussed and reviewed. All questions answered. The patient agrees to proceed.  I appreciate the opportunity to care for this patient.  WU:JWJX,B Riley Lam, MD

## 2012-12-02 NOTE — Patient Instructions (Addendum)
You have been scheduled for an endoscopy and colonoscopy with propofol. Please follow the written instructions given to you at your visit today. Please use the moviprep kit we have given you today. If you use inhalers (even only as needed), please bring them with you on the day of your procedure.  Thank you for choosing me and [facility].  Iva Boop, M.D., [facility]

## 2012-12-14 ENCOUNTER — Ambulatory Visit (HOSPITAL_BASED_OUTPATIENT_CLINIC_OR_DEPARTMENT_OTHER): Payer: Medicare Other

## 2012-12-14 VITALS — BP 126/81 | HR 82 | Temp 96.9°F

## 2012-12-14 DIAGNOSIS — C3490 Malignant neoplasm of unspecified part of unspecified bronchus or lung: Secondary | ICD-10-CM

## 2012-12-14 MED ORDER — HEPARIN SOD (PORK) LOCK FLUSH 100 UNIT/ML IV SOLN
500.0000 [IU] | Freq: Once | INTRAVENOUS | Status: AC
  Administered 2012-12-14: 500 [IU] via INTRAVENOUS
  Filled 2012-12-14: qty 5

## 2012-12-14 MED ORDER — SODIUM CHLORIDE 0.9 % IJ SOLN
10.0000 mL | INTRAMUSCULAR | Status: DC | PRN
Start: 2012-12-14 — End: 2012-12-14
  Administered 2012-12-14: 10 mL via INTRAVENOUS
  Filled 2012-12-14: qty 10

## 2012-12-14 NOTE — Patient Instructions (Addendum)

## 2012-12-15 ENCOUNTER — Encounter: Payer: Self-pay | Admitting: Internal Medicine

## 2013-01-03 ENCOUNTER — Ambulatory Visit (AMBULATORY_SURGERY_CENTER): Payer: Medicare Other | Admitting: Internal Medicine

## 2013-01-03 ENCOUNTER — Encounter: Payer: Self-pay | Admitting: Internal Medicine

## 2013-01-03 VITALS — BP 114/78 | HR 73 | Temp 98.3°F | Resp 12 | Ht 71.0 in | Wt 209.0 lb

## 2013-01-03 DIAGNOSIS — D126 Benign neoplasm of colon, unspecified: Secondary | ICD-10-CM

## 2013-01-03 DIAGNOSIS — Z86010 Personal history of colonic polyps: Secondary | ICD-10-CM

## 2013-01-03 DIAGNOSIS — K5730 Diverticulosis of large intestine without perforation or abscess without bleeding: Secondary | ICD-10-CM

## 2013-01-03 DIAGNOSIS — K7460 Unspecified cirrhosis of liver: Secondary | ICD-10-CM

## 2013-01-03 DIAGNOSIS — Z1211 Encounter for screening for malignant neoplasm of colon: Secondary | ICD-10-CM

## 2013-01-03 MED ORDER — SODIUM CHLORIDE 0.9 % IV SOLN: 500.0000 mL | INTRAVENOUS | Status: DC

## 2013-01-03 NOTE — Patient Instructions (Addendum)
The esophagus, stomach and duodenum were normal. No swollen veins or varices. This is good news.  The colonoscopy revealed a small polyp - removed, and diverticulosis and hemorrhoids. Nothing bad.  I will let you know the pathology results by mail.  I appreciate the opportunity to care for you.  Iva Boop, MD, FACG  YOU HAD AN ENDOSCOPIC PROCEDURE TODAY AT THE  ENDOSCOPY CENTER: Refer to the procedure report that was given to you for any specific questions about what was found during the examination.  If the procedure report does not answer your questions, please call your gastroenterologist to clarify.  If you requested that your care partner not be given the details of your procedure findings, then the procedure report has been included in a sealed envelope for you to review at your convenience later.  YOU SHOULD EXPECT: Some feelings of bloating in the abdomen. Passage of more gas than usual.  Walking can help get rid of the air that was put into your GI tract during the procedure and reduce the bloating. If you had a lower endoscopy (such as a colonoscopy or flexible sigmoidoscopy) you may notice spotting of blood in your stool or on the toilet paper. If you underwent a bowel prep for your procedure, then you may not have a normal bowel movement for a few days.  DIET: Your first meal following the procedure should be a light meal and then it is ok to progress to your normal diet.  A half-sandwich or bowl of soup is an example of a good first meal.  Heavy or fried foods are harder to digest and may make you feel nauseous or bloated.  Likewise meals heavy in dairy and vegetables can cause extra gas to form and this can also increase the bloating.  Drink plenty of fluids but you should avoid alcoholic beverages for 24 hours.  ACTIVITY: Your care partner should take you home directly after the procedure.  You should plan to take it easy, moving slowly for the rest of the day.  You can  resume normal activity the day after the procedure however you should NOT DRIVE or use heavy machinery for 24 hours (because of the sedation medicines used during the test).    SYMPTOMS TO REPORT IMMEDIATELY: A gastroenterologist can be reached at any hour.  During normal business hours, 8:30 AM to 5:00 PM Monday through Friday, call [phone].  After hours and on weekends, please call the GI answering service at [phone] who will take a message and have the physician on call contact you.   Following lower endoscopy (colonoscopy or flexible sigmoidoscopy):  Excessive amounts of blood in the stool  Significant tenderness or worsening of abdominal pains  Swelling of the abdomen that is new, acute  Fever of 100F or higher  Following upper endoscopy (EGD)  Vomiting of blood or coffee ground material  New chest pain or pain under the shoulder blades  Painful or persistently difficult swallowing  New shortness of breath  Fever of 100F or higher  Black, tarry-looking stools  FOLLOW UP: If any biopsies were taken you will be contacted by phone or by letter within the next 1-3 weeks.  Call your gastroenterologist if you have not heard about the biopsies in 3 weeks.  Our staff will call the home number listed on your records the next business day following your procedure to check on you and address any questions or concerns that you may have at that time  regarding the information given to you following your procedure. This is a courtesy call and so if there is no answer at the home number and we have not heard from you through the emergency physician on call, we will assume that you have returned to your regular daily activities without incident.  SIGNATURES/CONFIDENTIALITY: You and/or your care partner have signed paperwork which will be entered into your electronic medical record.  These signatures attest to the fact that that the information above on your After Visit Summary has been  reviewed and is understood.  Full responsibility of the confidentiality of this discharge information lies with you and/or your care-partner.

## 2013-01-03 NOTE — Op Note (Signed)
[facility] [address] [place], [address]   COLONOSCOPY PROCEDURE REPORT  PATIENT: Joseph [name], Joseph [name].  MR#: #[id number] BIRTHDATE: [birth year] , 72  yrs. old GENDER: Male ENDOSCOPIST: Iva Boop, MD, [facility]  PROCEDURE DATE:  01/03/2013 PROCEDURE:   Colonoscopy with snare polypectomy ASA CLASS:   Class III INDICATIONS:Screening and surveillance,personal history of colonic polyps.   right sided adenoma removed 2007-2008 MEDICATIONS: There was residual sedation effect present from prior procedure, propofol (Diprivan) 100mg  IV, MAC sedation, administered by CRNA, and These medications were titrated to patient response per physician's verbal order  DESCRIPTION OF PROCEDURE:   After the risks benefits and alternatives of the procedure were thoroughly explained, informed consent was obtained.  A digital rectal exam revealed no abnormalities of the rectum and A digital rectal exam revealed no prostatic nodules.   The LB [id number]  endoscope was introduced through the anus and advanced to the cecum, which was identified by both the appendix and ileocecal valve. No adverse events experienced.   The quality of the prep was excellent using Suprep  The instrument was then slowly withdrawn as the colon was fully examined.      COLON FINDINGS: A polypoid shaped sessile polyp measuring 5 mm in size was found in the ascending colon.  A polypectomy was performed with a cold snare.  The resection was complete and the polyp tissue was completely retrieved.   Moderate diverticulosis was noted throughout the entire examined colon.   Small internal hemorrhoids were found. Otherwise normal.  Retroflexed views revealed internal hemorrhoids. The time to cecum=2 minutes 10 seconds.  Withdrawal time=11 minutes 00 seconds.  The scope was withdrawn and the procedure completed. COMPLICATIONS: There were no complications.  ENDOSCOPIC IMPRESSION: 1.   Sessile polyp  measuring 5 mm in size was found in the ascending colon; polypectomy was performed with a cold snare 2.   Moderate diverticulosis was noted throughout the entire examined colon 3.   Small internal hemorrhoids - otherwise normal  RECOMMENDATIONS: Await pathology results   eSigned:  Iva Boop, MD, [facility] 01/03/2013 3:42 PM  cc: The Patient and W.  Buren Kos, MD

## 2013-01-03 NOTE — Op Note (Signed)
[facility] [address] [place], [address]   ENDOSCOPY PROCEDURE REPORT  PATIENT: Joseph, [name].  MR#: #[id number] BIRTHDATE: [birth year] , 72  yrs. old GENDER: Male ENDOSCOPIST: Iva Boop, MD, [facility] PROCEDURE DATE:  01/03/2013 PROCEDURE:  EGD, screening ASA CLASS:     Class III INDICATIONS:  Screening for varices.  Cirrhosis MEDICATIONS: propofol (Diprivan) 100mg  IV, MAC sedation, administered by CRNA, and These medications were titrated to patient response per physician's verbal order TOPICAL ANESTHETIC: none  DESCRIPTION OF PROCEDURE: After the risks benefits and alternatives of the procedure were thoroughly explained, informed consent was obtained.  The LB GIF-H180 [id number] endoscope was introduced through the mouth and advanced to the second portion of the duodenum. Without limitations.  The instrument was slowly withdrawn as the mucosa was fully examined.      The upper, middle and distal third of the esophagus were carefully inspected and no abnormalities were noted.  The z-line was well seen at the GEJ.  The endoscope was pushed into the fundus which was normal including a retroflexed view.  The antrum, gastric body, first and second part of the duodenum were unremarkable. Retroflexed views revealed no abnormalities.     The scope was then withdrawn from the patient and the procedure completed.  COMPLICATIONS: There were no complications. ENDOSCOPIC IMPRESSION: Normal EGD  RECOMMENDATIONS: 1.  Proceed with a Colonoscopy. 2.   Consider repeat screening EGD in 2-3 years   eSigned:  Iva Boop, MD, [facility] 01/03/2013 3:32 PM   CC:W.  Buren Kos, MD and The Patient

## 2013-01-03 NOTE — Progress Notes (Signed)
Called to room to assist during endoscopic procedure.  Patient ID and intended procedure confirmed with present staff. Received instructions for my participation in the procedure from the performing physician.  

## 2013-01-03 NOTE — Progress Notes (Addendum)
Patient did not have preoperative order for IV antibiotic SSI prophylaxis. (G8918)  Patient did not experience any of the following events: a burn prior to discharge; a fall within the facility; wrong site/side/patient/procedure/implant event; or a hospital transfer or hospital admission upon discharge from the facility. (G8907)  

## 2013-01-03 NOTE — Progress Notes (Signed)
Propofol given over incremental dosages 

## 2013-01-04 ENCOUNTER — Telehealth: Payer: Self-pay

## 2013-01-04 NOTE — Telephone Encounter (Signed)
  Follow up Call-  Call back number 01/03/2013  Post procedure Call Back phone  # [phone]  Permission to leave phone message Yes     Patient questions:  Do you have a fever, pain , or abdominal swelling? no Pain Score  0 *  Have you tolerated food without any problems? yes  Have you been able to return to your normal activities? yes  Do you have any questions about your discharge instructions: Diet   no Medications  no Follow up visit  no  Do you have questions or concerns about your Care? no  Actions: * If pain score is 4 or above: No action needed, pain <4.

## 2013-01-09 ENCOUNTER — Encounter: Payer: Self-pay | Admitting: Internal Medicine

## 2013-01-09 NOTE — Progress Notes (Signed)
Quick Note:  Diminutive adenoma Repeat colonoscopy in about 12/2017 ______

## 2013-01-25 ENCOUNTER — Ambulatory Visit (HOSPITAL_BASED_OUTPATIENT_CLINIC_OR_DEPARTMENT_OTHER): Payer: Medicare Other

## 2013-01-25 VITALS — BP 127/80 | HR 83 | Temp 97.1°F

## 2013-01-25 DIAGNOSIS — C3410 Malignant neoplasm of upper lobe, unspecified bronchus or lung: Secondary | ICD-10-CM

## 2013-01-25 DIAGNOSIS — C801 Malignant (primary) neoplasm, unspecified: Secondary | ICD-10-CM

## 2013-01-25 DIAGNOSIS — Z452 Encounter for adjustment and management of vascular access device: Secondary | ICD-10-CM

## 2013-01-25 MED ORDER — SODIUM CHLORIDE 0.9 % IJ SOLN
10.0000 mL | INTRAMUSCULAR | Status: DC | PRN
  Administered 2013-01-25: 10 mL via INTRAVENOUS
  Filled 2013-01-25: qty 10

## 2013-01-25 MED ORDER — HEPARIN SOD (PORK) LOCK FLUSH 100 UNIT/ML IV SOLN
500.0000 [IU] | Freq: Once | INTRAVENOUS | Status: AC
  Administered 2013-01-25: 500 [IU] via INTRAVENOUS
  Filled 2013-01-25: qty 5

## 2013-02-23 ENCOUNTER — Telehealth: Payer: Self-pay | Admitting: Medical Oncology

## 2013-02-23 NOTE — Telephone Encounter (Signed)
Pt clarifying CT chest with ot with out contrast. I left message that it is ordered with contrast.

## 2013-03-06 ENCOUNTER — Ambulatory Visit: Payer: Medicare Other | Admitting: Internal Medicine

## 2013-03-06 ENCOUNTER — Ambulatory Visit (HOSPITAL_COMMUNITY)
Admission: RE | Admit: 2013-03-06 | Discharge: 2013-03-06 | Disposition: A | Discharge status: Home / Self Care | Payer: Medicare Other | Source: Ambulatory Visit | Attending: Internal Medicine | Admitting: Internal Medicine

## 2013-03-06 ENCOUNTER — Encounter (HOSPITAL_COMMUNITY): Payer: Self-pay

## 2013-03-06 ENCOUNTER — Other Ambulatory Visit (HOSPITAL_BASED_OUTPATIENT_CLINIC_OR_DEPARTMENT_OTHER): Payer: Medicare Other | Admitting: Lab

## 2013-03-06 DIAGNOSIS — K7460 Unspecified cirrhosis of liver: Secondary | ICD-10-CM | POA: Insufficient documentation

## 2013-03-06 DIAGNOSIS — I2510 Atherosclerotic heart disease of native coronary artery without angina pectoris: Secondary | ICD-10-CM | POA: Insufficient documentation

## 2013-03-06 DIAGNOSIS — Z9221 Personal history of antineoplastic chemotherapy: Secondary | ICD-10-CM | POA: Insufficient documentation

## 2013-03-06 DIAGNOSIS — I7090 Unspecified atherosclerosis: Secondary | ICD-10-CM | POA: Insufficient documentation

## 2013-03-06 DIAGNOSIS — Z923 Personal history of irradiation: Secondary | ICD-10-CM | POA: Insufficient documentation

## 2013-03-06 DIAGNOSIS — C3410 Malignant neoplasm of upper lobe, unspecified bronchus or lung: Secondary | ICD-10-CM | POA: Insufficient documentation

## 2013-03-06 DIAGNOSIS — J438 Other emphysema: Secondary | ICD-10-CM | POA: Insufficient documentation

## 2013-03-06 LAB — CBC WITH DIFFERENTIAL/PLATELET
BASO%: 0.4 % (ref 0.0–2.0)
Basophils Absolute: 0 10*3/uL (ref 0.0–0.1)
EOS%: 1.6 % (ref 0.0–7.0)
HCT: 46.2 % (ref 38.4–49.9)
HGB: 15.7 g/dL (ref 13.0–17.1)
LYMPH%: 26.7 % (ref 14.0–49.0)
MCH: 33.8 pg — ABNORMAL HIGH (ref 27.2–33.4)
MCHC: 34 g/dL (ref 32.0–36.0)
MCV: 99.4 fL — ABNORMAL HIGH (ref 79.3–98.0)
MONO%: 10.1 % (ref 0.0–14.0)
NEUT%: 61.2 % (ref 39.0–75.0)
Platelets: 141 10*3/uL (ref 140–400)
lymph#: 1.5 10*3/uL (ref 0.9–3.3)

## 2013-03-06 LAB — COMPREHENSIVE METABOLIC PANEL (CC13)
ALT: 40 U/L (ref 0–55)
AST: 36 U/L — ABNORMAL HIGH (ref 5–34)
Alkaline Phosphatase: 154 U/L — ABNORMAL HIGH (ref 40–150)
BUN: 19.5 mg/dL (ref 7.0–26.0)
Calcium: 9 mg/dL (ref 8.4–10.4)
Creatinine: 1 mg/dL (ref 0.7–1.3)
Total Bilirubin: 1.33 mg/dL — ABNORMAL HIGH (ref 0.20–1.20)

## 2013-03-06 MED ORDER — IOHEXOL 300 MG/ML  SOLN
80.0000 mL | Freq: Once | INTRAMUSCULAR | Status: AC | PRN
  Administered 2013-03-06: 80 mL via INTRAVENOUS

## 2013-03-07 ENCOUNTER — Ambulatory Visit (HOSPITAL_BASED_OUTPATIENT_CLINIC_OR_DEPARTMENT_OTHER): Payer: Medicare Other | Admitting: Internal Medicine

## 2013-03-07 ENCOUNTER — Encounter: Payer: Self-pay | Admitting: Internal Medicine

## 2013-03-07 ENCOUNTER — Telehealth: Payer: Self-pay | Admitting: Internal Medicine

## 2013-03-07 DIAGNOSIS — C3410 Malignant neoplasm of upper lobe, unspecified bronchus or lung: Secondary | ICD-10-CM

## 2013-03-07 NOTE — Progress Notes (Signed)
[facility] Telephone:([phone]) [phone]   Fax:(336) [phone]  OFFICE PROGRESS NOTE  Kari Baars, MD [address] [facility], [place]. [place] [address]  DIAGNOSIS: Stage IIIa/IIIB non-small cell lung cancer   PRIOR THERAPY:  1) Concurrent chemoradiation with chemotherapy in the form of weekly carboplatin for an AUC of 2 and paclitaxel at 45 mg per meter squared.  2) Consolidation chemotherapy with carboplatin for AUC of 5 and paclitaxel 175 mg/M2 every 3 weeks with Neulasta support, status post 3 cycles, last cycle was given on 01/05/2012 with partial response.   CURRENT THERAPY: Observation.  INTERVAL HISTORY: Joseph [name] 72 y.o. male returns to the clinic today for followup visit accompanied by his wife. The patient is feeling fine today with no specific complaints. He denied having any significant chest pain, shortness of breath, cough or hemoptysis. Denied having any significant weight loss or night sweats. The patient had repeat CT scan of the chest performed recently and he is here for evaluation and discussion of his scan results.  MEDICAL HISTORY: Past Medical History  Diagnosis Date  . History of colon polyps 2006,2008  . 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 Hyperlipidemia     takes Pravastatin  . Emphysema   . Lung cancer     right upper lobe  . Enlarged prostate     takes Rapaflo daily  . Arthritis     left knee and back arthrtis  . Hypothyroidism     takes Synthroid daily  . Cataract   . Hx of radiation therapy 09/02/11 to 10/19/11    chest  . Diverticulosis   . Cirrhosis     By radiography, seen on CT  . Rosacea     ALLERGIES:  has No Known Allergies.  MEDICATIONS:  Current Outpatient Prescriptions  Medication Sig Dispense Refill  . celecoxib (CELEBREX) 200 MG capsule Take 200 mg by mouth 2 (two) times daily as needed.       Marland Kitchen FINACEA 15 % cream Apply 1 application topically as directed.      .  lidocaine-prilocaine (EMLA) cream Apply 1 application topically as needed.        . Multiple Vitamins-Minerals (MULTIVITAMINS THER. W/MINERALS) TABS Take 1 tablet by mouth daily.        Marland Kitchen nystatin-triamcinolone (MYCOLOG II) cream Apply 1 application topically daily as needed.       Marland Kitchen SYNTHROID 175 MCG tablet Take 150 mcg by mouth daily.       . tamsulosin (FLOMAX) 0.4 MG CAPS Take 0.4 mg by mouth daily.      Marland Kitchen tiotropium (SPIRIVA) 18 MCG inhalation capsule Place 18 mcg into inhaler and inhale daily.      Marland Kitchen HYDROcodone-acetaminophen (NORCO) 5-325 MG per tablet Take 1 tablet by mouth every 6 (six) hours as needed for pain.  30 tablet  0  . Misc Natural Products (PROSTATE HEALTH) CAPS Take by mouth.         No current facility-administered medications for this visit.    SURGICAL HISTORY:  Past Surgical History  Procedure Laterality Date  . Hernia repair  70's  . Vasectomy  70's  . Finger surgery  2008    left pointer finger  . Knee arthroscopy  2008    left knee  . Tonsillectomy      as a child  . Colonoscopy w/ biopsies      multiple   . Cardiac catheterization  2003  .  Esophagogastroduodenoscopy  2006  . Portacath placement  08/10/2011    Procedure: INSERTION PORT-A-CATH;  Surgeon: Norton Blizzard, MD;  Location: [facility] OR;  Service: Thoracic;  Laterality: Left;  Left Subclavian    REVIEW OF SYSTEMS:  A comprehensive review of systems was negative.   PHYSICAL EXAMINATION: General appearance: alert, cooperative and no distress Head: Normocephalic, without obvious abnormality, atraumatic Neck: no adenopathy Lymph nodes: Cervical, supraclavicular, and axillary nodes normal. Resp: clear to auscultation bilaterally Cardio: regular rate and rhythm, S1, S2 normal, no murmur, click, rub or gallop GI: soft, non-tender; bowel sounds normal; no masses,  no organomegaly Extremities: extremities normal, atraumatic, no cyanosis or edema  ECOG PERFORMANCE STATUS: 0 - Asymptomatic  Blood  pressure 135/89, pulse 76, temperature 97 F (36.1 C), temperature source Oral, resp. rate 18, height 5\' 11"  (1.803 m), weight 215 lb (97.523 kg), SpO2 99.00%.  LABORATORY DATA: Lab Results  Component Value Date   WBC 5.7 03/06/2013   HGB 15.7 03/06/2013   HCT 46.2 03/06/2013   MCV 99.4* 03/06/2013   PLT 141 03/06/2013      Chemistry      Component Value Date/Time   NA 138 03/06/2013 1249   NA 140 11/01/2012 1142   K 4.6 03/06/2013 1249   K 4.2 11/01/2012 1142   CL 107 11/01/2012 1142   CL 109* 08/01/2012 1046   CO2 27 03/06/2013 1249   CO2 23 11/01/2012 1142   BUN 19.5 03/06/2013 1249   BUN 12 11/01/2012 1142   CREATININE 1.0 03/06/2013 1249   CREATININE 0.85 11/01/2012 1142   CREATININE 0.71 07/30/2011 1640      Component Value Date/Time   CALCIUM 9.0 03/06/2013 1249   CALCIUM 8.8 11/01/2012 1142   ALKPHOS 154* 03/06/2013 1249   ALKPHOS 148* 11/01/2012 1142   AST 36* 03/06/2013 1249   AST 28 11/01/2012 1142   ALT 40 03/06/2013 1249   ALT 21 11/01/2012 1142   BILITOT 1.33* 03/06/2013 1249   BILITOT 1.1 11/01/2012 1142       RADIOGRAPHIC STUDIES: Ct Chest W Contrast  03/06/2013   *RADIOLOGY REPORT*  Clinical Data: Lung cancer.  Chemotherapy and radiation therapy complete.  Shortness of breath.  CT CHEST WITH CONTRAST  Technique:  Multidetector CT imaging of the chest was performed following the standard protocol during bolus administration of intravenous contrast.  Contrast: 80mL OMNIPAQUE IOHEXOL 300 MG/ML  SOLN  Comparison: 11/02/2012.  Findings: Mediastinal lymph nodes are not enlarged by CT size criteria.  No hilar or axillary adenopathy.  Atherosclerotic calcification of the arterial vasculature, including coronary arteries.  Heart size normal.  No pericardial effusion.  Left subclavian Port-A-Cath tip terminates in the SVC.  Emphysema.  Right upper lobe nodule measures approximately 6 x 12 mm (previously 7 x 13 mm) with surrounding post-treatment changes and volume loss.  10 mm nodule along the minor fissure (image  30) is unchanged from 11/02/2012 but may be minimally more prominent than on 01/26/2012, at which time it measured 8 mm in long axis. Mild scarring in the medial left upper lobe.  No pleural fluid. Airway is unremarkable.  Incidental imaging of the upper abdomen shows marginal irregularity of the liver.  Gastrohepatic ligament lymph nodes are not enlarged by CT size criteria.  No worrisome lytic or sclerotic lesions. Scattered sclerotic foci in the spine are unchanged and may present bone islands.  IMPRESSION:  1.  Right upper lobe nodule is stable or minimally smaller than on 11/02/2012. 2.  Nodule  along the minor fissure is stable from 11/02/2012 but may be minimally more prominent than on 01/26/2012. 3.  Cirrhosis.   Original Report Authenticated By: Leanna Battles, M.D.    ASSESSMENT AND PLAN: This is a very pleasant 72 years old white male with history of stage IIIa/IIIB non-small cell lung cancer status post concurrent chemoradiation followed by consolidation chemotherapy and he has been observation since May of 2013 with no evidence for disease progression. I discussed the scan results with the patient and his wife. I recommended for him to continue on observation with repeat CT scan of the chest in 4 months.  The patient was advised to call immediately if he has any concerning symptoms in the interval. All questions were answered. The patient knows to call the clinic with any problems, questions or concerns. We can certainly see the patient much sooner if necessary.

## 2013-03-07 NOTE — Patient Instructions (Signed)
No evidence for disease progression on his recent scan. Followup visit in 4 months with repeat CT scan of the chest. 

## 2013-03-07 NOTE — Telephone Encounter (Signed)
Gave pt appt for lab and MD on November and flush and October 2014

## 2013-04-05 ENCOUNTER — Other Ambulatory Visit: Payer: Self-pay

## 2013-04-10 ENCOUNTER — Telehealth: Payer: Self-pay | Admitting: *Deleted

## 2013-04-10 NOTE — Telephone Encounter (Signed)
Patient called and moved his flush appt for 8/20 to later in the day

## 2013-04-19 ENCOUNTER — Ambulatory Visit (HOSPITAL_BASED_OUTPATIENT_CLINIC_OR_DEPARTMENT_OTHER): Payer: Medicare Other

## 2013-04-19 DIAGNOSIS — Z452 Encounter for adjustment and management of vascular access device: Secondary | ICD-10-CM

## 2013-04-19 DIAGNOSIS — C3410 Malignant neoplasm of upper lobe, unspecified bronchus or lung: Secondary | ICD-10-CM

## 2013-04-19 MED ORDER — HEPARIN SOD (PORK) LOCK FLUSH 100 UNIT/ML IV SOLN
500.0000 [IU] | Freq: Once | INTRAVENOUS | Status: AC
  Administered 2013-04-19: 500 [IU] via INTRAVENOUS
  Filled 2013-04-19: qty 5

## 2013-04-19 MED ORDER — SODIUM CHLORIDE 0.9 % IJ SOLN
10.0000 mL | INTRAMUSCULAR | Status: DC | PRN
  Administered 2013-04-19: 10 mL via INTRAVENOUS
  Filled 2013-04-19: qty 10

## 2013-05-31 ENCOUNTER — Ambulatory Visit (HOSPITAL_BASED_OUTPATIENT_CLINIC_OR_DEPARTMENT_OTHER): Payer: Medicare Other

## 2013-05-31 DIAGNOSIS — C3410 Malignant neoplasm of upper lobe, unspecified bronchus or lung: Secondary | ICD-10-CM

## 2013-05-31 DIAGNOSIS — Z452 Encounter for adjustment and management of vascular access device: Secondary | ICD-10-CM

## 2013-05-31 MED ORDER — HEPARIN SOD (PORK) LOCK FLUSH 100 UNIT/ML IV SOLN
500.0000 [IU] | Freq: Once | INTRAVENOUS | Status: AC
  Administered 2013-05-31: 500 [IU] via INTRAVENOUS
  Filled 2013-05-31: qty 5

## 2013-05-31 MED ORDER — SODIUM CHLORIDE 0.9 % IJ SOLN
10.0000 mL | INTRAMUSCULAR | Status: DC | PRN
  Administered 2013-05-31: 10 mL via INTRAVENOUS
  Filled 2013-05-31: qty 10

## 2013-05-31 NOTE — Patient Instructions (Addendum)
Implanted Port Instructions  An implanted port is a central line that has a round shape and is placed under the skin. It is used for long-term IV (intravenous) access for:  · Medicine.  · Fluids.  · Liquid nutrition, such as TPN (total parenteral nutrition).  · Blood samples.  Ports can be placed:  · In the chest area just below the collarbone (this is the most common place.)  · In the arms.  · In the belly (abdomen) area.  · In the legs.  PARTS OF THE PORT  A port has 2 main parts:  · The reservoir. The reservoir is round, disc-shaped, and will be a small, raised area under your skin.  · The reservoir is the part where a needle is inserted (accessed) to either give medicines or to draw blood.  · The catheter. The catheter is a long, slender tube that extends from the reservoir. The catheter is placed into a large vein.  · Medicine that is inserted into the reservoir goes into the catheter and then into the vein.  INSERTION OF THE PORT  · The port is surgically placed in either an operating room or in a procedural area (interventional radiology).  · Medicine may be given to help you relax during the procedure.  · The skin where the port will be inserted is numbed (local anesthetic).  · 1 or 2 small cuts (incisions) will be made in the skin to insert the port.  · The port can be used after it has been inserted.  INCISION SITE CARE  · The incision site may have small adhesive strips on it. This helps keep the incision site closed. Sometimes, no adhesive strips are placed. Instead of adhesive strips, a special kind of surgical glue is used to keep the incision closed.  · If adhesive strips were placed on the incision sites, do not take them off. They will fall off on their own.  · The incision site may be sore for 1 to 2 days. Pain medicine can help.  · Do not get the incision site wet. Bathe or shower as directed by your caregiver.  · The incision site should heal in 5 to 7 days. A small scar may form after the  incision has healed.  ACCESSING THE PORT  Special steps must be taken to access the port:  · Before the port is accessed, a numbing cream can be placed on the skin. This helps numb the skin over the port site.  · A sterile technique is used to access the port.  · The port is accessed with a needle. Only "non-coring" port needles should be used to access the port. Once the port is accessed, a blood return should be checked. This helps ensure the port is in the vein and is not clogged (clotted).  · If your caregiver believes your port should remain accessed, a clear (transparent) bandage will be placed over the needle site. The bandage and needle will need to be changed every week or as directed by your caregiver.  · Keep the bandage covering the needle clean and dry. Do not get it wet. Follow your caregiver's instructions on how to take a shower or bath when the port is accessed.  · If your port does not need to stay accessed, no bandage is needed over the port.  FLUSHING THE PORT  Flushing the port keeps it from getting clogged. How often the port is flushed depends on:  · If a   constant infusion is running. If a constant infusion is running, the port may not need to be flushed.  · If intermittent medicines are given.  · If the port is not being used.  For intermittent medicines:  · The port will need to be flushed:  · After medicines have been given.  · After blood has been drawn.  · As part of routine maintenance.  · A port is normally flushed with:  · Normal saline.  · Heparin.  · Follow your caregiver's advice on how often, how much, and the type of flush to use on your port.  IMPORTANT PORT INFORMATION  · Tell your caregiver if you are allergic to heparin.  · After your port is placed, you will get a manufacturer's information card. The card has information about your port. Keep this card with you at all times.  · There are many types of ports available. Know what kind of port you have.  · In case of an  emergency, it may be helpful to wear a medical alert bracelet. This can help alert health care workers that you have a port.  · The port can stay in for as long as your caregiver believes it is necessary.  · When it is time for the port to come out, surgery will be done to remove it. The surgery will be similar to how the port was put in.  · If you are in the hospital or clinic:  · Your port will be taken care of and flushed by a nurse.  · If you are at home:  · A home health care nurse may give medicines and take care of the port.  · You or a family member can get special training and directions for giving medicine and taking care of the port at home.  SEEK IMMEDIATE MEDICAL CARE IF:   · Your port does not flush or you are unable to get a blood return.  · New drainage or pus is coming from the incision.  · A bad smell is coming from the incision site.  · You develop swelling or increased redness at the incision site.  · You develop increased swelling or pain at the port site.  · You develop swelling or pain in the surrounding skin near the port.  · You have an oral temperature above 102° F (38.9° C), not controlled by medicine.  MAKE SURE YOU:   · Understand these instructions.  · Will watch your condition.  · Will get help right away if you are not doing well or get worse.  Document Released: 08/17/2005 Document Revised: 11/09/2011 Document Reviewed: 11/08/2008  Exit[employer]® Patient Information ©2014 [employer].

## 2013-07-06 ENCOUNTER — Other Ambulatory Visit: Payer: Self-pay

## 2013-07-17 ENCOUNTER — Other Ambulatory Visit (HOSPITAL_BASED_OUTPATIENT_CLINIC_OR_DEPARTMENT_OTHER): Payer: Medicare Other | Admitting: Lab

## 2013-07-17 ENCOUNTER — Ambulatory Visit (HOSPITAL_COMMUNITY)
Admission: RE | Admit: 2013-07-17 | Discharge: 2013-07-17 | Disposition: A | Discharge status: Home / Self Care | Payer: Medicare Other | Source: Ambulatory Visit | Attending: Internal Medicine | Admitting: Internal Medicine

## 2013-07-17 ENCOUNTER — Encounter (HOSPITAL_COMMUNITY): Payer: Self-pay

## 2013-07-17 DIAGNOSIS — C3410 Malignant neoplasm of upper lobe, unspecified bronchus or lung: Secondary | ICD-10-CM | POA: Insufficient documentation

## 2013-07-17 DIAGNOSIS — R911 Solitary pulmonary nodule: Secondary | ICD-10-CM | POA: Insufficient documentation

## 2013-07-17 DIAGNOSIS — K7460 Unspecified cirrhosis of liver: Secondary | ICD-10-CM | POA: Insufficient documentation

## 2013-07-17 LAB — COMPREHENSIVE METABOLIC PANEL (CC13)
Albumin: 3.2 g/dL — ABNORMAL LOW (ref 3.5–5.0)
Alkaline Phosphatase: 199 U/L — ABNORMAL HIGH (ref 40–150)
CO2: 23 mEq/L (ref 22–29)
Calcium: 8.9 mg/dL (ref 8.4–10.4)
Chloride: 110 mEq/L — ABNORMAL HIGH (ref 98–109)
Glucose: 94 mg/dl (ref 70–140)
Potassium: 4.2 mEq/L (ref 3.5–5.1)
Sodium: 141 mEq/L (ref 136–145)
Total Protein: 7 g/dL (ref 6.4–8.3)

## 2013-07-17 LAB — CBC WITH DIFFERENTIAL/PLATELET
Basophils Absolute: 0 10*3/uL (ref 0.0–0.1)
Eosinophils Absolute: 0.1 10*3/uL (ref 0.0–0.5)
HGB: 14.6 g/dL (ref 13.0–17.1)
MONO#: 0.5 10*3/uL (ref 0.1–0.9)
MONO%: 10.7 % (ref 0.0–14.0)
NEUT#: 3.2 10*3/uL (ref 1.5–6.5)
RBC: 4.35 10*6/uL (ref 4.20–5.82)
RDW: 14 % (ref 11.0–14.6)
WBC: 5.1 10*3/uL (ref 4.0–10.3)
lymph#: 1.2 10*3/uL (ref 0.9–3.3)

## 2013-07-17 MED ORDER — IOHEXOL 300 MG/ML  SOLN
80.0000 mL | Freq: Once | INTRAMUSCULAR | Status: AC | PRN
  Administered 2013-07-17: 80 mL via INTRAVENOUS

## 2013-07-19 ENCOUNTER — Other Ambulatory Visit: Payer: Self-pay | Admitting: Vascular Surgery

## 2013-07-19 ENCOUNTER — Encounter: Payer: Self-pay | Admitting: Internal Medicine

## 2013-07-19 ENCOUNTER — Ambulatory Visit (HOSPITAL_BASED_OUTPATIENT_CLINIC_OR_DEPARTMENT_OTHER): Payer: Medicare Other | Admitting: Internal Medicine

## 2013-07-19 DIAGNOSIS — Z01810 Encounter for preprocedural cardiovascular examination: Secondary | ICD-10-CM

## 2013-07-19 DIAGNOSIS — I714 Abdominal aortic aneurysm, without rupture, unspecified: Secondary | ICD-10-CM

## 2013-07-19 DIAGNOSIS — C3410 Malignant neoplasm of upper lobe, unspecified bronchus or lung: Secondary | ICD-10-CM

## 2013-07-19 NOTE — Progress Notes (Signed)
[facility] Telephone:([phone]) [phone]   Fax:([phone]  OFFICE PROGRESS NOTE  Kari Baars, MD [address] [facility], [place]. [place] [address]  DIAGNOSIS: Stage IIIa/IIIB non-small cell lung cancer   PRIOR THERAPY:  1) Concurrent chemoradiation with chemotherapy in the form of weekly carboplatin for an AUC of 2 and paclitaxel at 45 mg per meter squared.  2) Consolidation chemotherapy with carboplatin for AUC of 5 and paclitaxel 175 mg/M2 every 3 weeks with Neulasta support, status post 3 cycles, last cycle was given on 01/05/2012 with partial response.   CURRENT THERAPY: Observation.  INTERVAL HISTORY: TONYA CARLILE 72 y.o. male returns to the clinic today for followup visit accompanied by his wife. The patient is feeling fine today with no specific complaints, except for increased numbness in his left foot after he started treatment with Celebrex for knee pain. He discontinue the treatment with Celebrex and the numbness improved. He denied having any significant chest pain, shortness of breath, cough or hemoptysis. Denied having any significant weight loss or night sweats. The patient had repeat CT scan of the chest performed recently and he is here for evaluation and discussion of his scan results.  MEDICAL HISTORY: Past Medical History  Diagnosis Date  . History of colon polyps 2006,2008  . 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 Hyperlipidemia     takes Pravastatin  . Emphysema   . Lung cancer     right upper lobe  . Enlarged prostate     takes Rapaflo daily  . Arthritis     left knee and back arthrtis  . Hypothyroidism     takes Synthroid daily  . Cataract   . Hx of radiation therapy 09/02/11 to 10/19/11    chest  . Diverticulosis   . Cirrhosis     By radiography, seen on CT  . Rosacea     ALLERGIES:  has No Known Allergies.  MEDICATIONS:  Current Outpatient Prescriptions  Medication Sig Dispense Refill  . FINACEA  15 % cream Apply 1 application topically as directed.      Marland Kitchen HYDROcodone-acetaminophen (NORCO) 5-325 MG per tablet Take 1 tablet by mouth every 6 (six) hours as needed for pain.  30 tablet  0  . lidocaine-prilocaine (EMLA) cream Apply 1 application topically as needed.        . Misc Natural Products (PROSTATE HEALTH) CAPS Take by mouth.        . Multiple Vitamins-Minerals (MULTIVITAMINS THER. W/MINERALS) TABS Take 1 tablet by mouth daily.        Marland Kitchen nystatin-triamcinolone (MYCOLOG II) cream Apply 1 application topically daily as needed.       Marland Kitchen SYNTHROID 175 MCG tablet Take 175 mcg by mouth daily.       . tamsulosin (FLOMAX) 0.4 MG CAPS Take 0.4 mg by mouth daily.      Marland Kitchen tiotropium (SPIRIVA) 18 MCG inhalation capsule Place 18 mcg into inhaler and inhale daily.       No current facility-administered medications for this visit.    SURGICAL HISTORY:  Past Surgical History  Procedure Laterality Date  . Hernia repair  70's  . Vasectomy  70's  . Finger surgery  2008    left pointer finger  . Knee arthroscopy  2008    left knee  . Tonsillectomy      as a child  . Colonoscopy w/ biopsies      multiple   .  Cardiac catheterization  2003  . Esophagogastroduodenoscopy  2006  . Portacath placement  08/10/2011    Procedure: INSERTION PORT-A-CATH;  Surgeon: Norton Blizzard, MD;  Location: [facility] OR;  Service: Thoracic;  Laterality: Left;  Left Subclavian    REVIEW OF SYSTEMS:  A comprehensive review of systems was negative.   PHYSICAL EXAMINATION: General appearance: alert, cooperative and no distress Head: Normocephalic, without obvious abnormality, atraumatic Neck: no adenopathy Lymph nodes: Cervical, supraclavicular, and axillary nodes normal. Resp: clear to auscultation bilaterally Cardio: regular rate and rhythm, S1, S2 normal, no murmur, click, rub or gallop GI: soft, non-tender; bowel sounds normal; no masses,  no organomegaly Extremities: extremities normal, atraumatic, no cyanosis or  edema  ECOG PERFORMANCE STATUS: 0 - Asymptomatic  Blood pressure 151/103, pulse 92, temperature 97.6 F (36.4 C), temperature source Oral, resp. rate 18, height 5\' 11"  (1.803 m), weight 213 lb 8 oz (96.843 kg), SpO2 95.00%.  LABORATORY DATA: Lab Results  Component Value Date   WBC 5.1 07/17/2013   HGB 14.6 07/17/2013   HCT 43.2 07/17/2013   MCV 99.2* 07/17/2013   PLT 134* 07/17/2013      Chemistry      Component Value Date/Time   NA 141 07/17/2013 0856   NA 140 11/01/2012 1142   K 4.2 07/17/2013 0856   K 4.2 11/01/2012 1142   CL 107 11/01/2012 1142   CL 109* 08/01/2012 1046   CO2 23 07/17/2013 0856   CO2 23 11/01/2012 1142   BUN 14.7 07/17/2013 0856   BUN 12 11/01/2012 1142   CREATININE 0.9 07/17/2013 0856   CREATININE 0.85 11/01/2012 1142   CREATININE 0.71 07/30/2011 1640      Component Value Date/Time   CALCIUM 8.9 07/17/2013 0856   CALCIUM 8.8 11/01/2012 1142   ALKPHOS 199* 07/17/2013 0856   ALKPHOS 148* 11/01/2012 1142   AST 54* 07/17/2013 0856   AST 28 11/01/2012 1142   ALT 63* 07/17/2013 0856   ALT 21 11/01/2012 1142   BILITOT 0.86 07/17/2013 0856   BILITOT 1.1 11/01/2012 1142       RADIOGRAPHIC STUDIES: Ct Chest W Contrast  07/17/2013   CLINICAL DATA:  Followup lung cancer  EXAM: CT CHEST WITH CONTRAST  TECHNIQUE: Multidetector CT imaging of the chest was performed during intravenous contrast administration.  CONTRAST:  80mL OMNIPAQUE IOHEXOL 300 MG/ML  SOLN  COMPARISON:  03/06/2013  FINDINGS: No pleural effusion identified. No airspace consolidation identified. Advanced changes of centrilobular and paraseptal emphysema identified. The index lesion within the right upper lobe measures 1.2 cm x 0.6 cm, image 17/series 4. This is unchanged from the previous exam. Right midlung nodule is stable measuring 9 mm, image 30/series 4. No new pulmonary nodules are mass is identified.  The heart size is normal. No pericardial or pleural effusion identified. 7 mm pre-vascular lymph node is  stable from previous exam. No new or enlarging mediastinal or hilar lymph nodes. There is no axillary or supraclavicular adenopathy.  Limited imaging through the upper abdomen shows no acute findings. The adrenal glands appear normal. Morphologic features of the liver are noted compatible with cirrhosis.  Review of the visualized osseous structures is significant for mild spondylosis. No aggressive lytic or sclerotic bone lesions identified.  IMPRESSION: 1. Stable right upper lobe nodule. The nodule along the minor fissure is also unchanged. 2. Cirrhosis   Electronically Signed   By: Signa Kell M.D.   On: 07/17/2013 12:08    ASSESSMENT AND PLAN: This is a very  pleasant 72 years old white male with history of stage IIIa/IIIB non-small cell lung cancer status post concurrent chemoradiation followed by consolidation chemotherapy and he has been observation since May of 2013 with no evidence for disease progression. I discussed the scan results with the patient and his wife. I recommended for him to continue on observation with repeat CT scan of the chest in 6 months.  The patient was advised to call immediately if he has any concerning symptoms in the interval. All questions were answered. The patient knows to call the clinic with any problems, questions or concerns. We can certainly see the patient much sooner if necessary.

## 2013-07-19 NOTE — Patient Instructions (Signed)
Followup visit in 6 months with repeat CT scan of the chest. 

## 2013-07-20 ENCOUNTER — Encounter: Payer: Self-pay | Admitting: Vascular Surgery

## 2013-08-08 ENCOUNTER — Telehealth: Payer: Self-pay | Admitting: Medical Oncology

## 2013-08-08 NOTE — Telephone Encounter (Signed)
Faxed note for surgeon to contact PCP for medical clearnace -pt on observation. Dr Asa Lente last note faxed to [facility] orthopedics.

## 2013-08-09 ENCOUNTER — Encounter: Payer: Self-pay | Admitting: Vascular Surgery

## 2013-08-10 ENCOUNTER — Ambulatory Visit (HOSPITAL_COMMUNITY)
Admission: RE | Admit: 2013-08-10 | Discharge: 2013-08-10 | Disposition: A | Discharge status: Home / Self Care | Payer: Medicare Other | Source: Ambulatory Visit | Attending: Vascular Surgery | Admitting: Vascular Surgery

## 2013-08-10 ENCOUNTER — Ambulatory Visit (INDEPENDENT_AMBULATORY_CARE_PROVIDER_SITE_OTHER): Payer: Medicare Other | Admitting: Vascular Surgery

## 2013-08-10 ENCOUNTER — Ambulatory Visit (INDEPENDENT_AMBULATORY_CARE_PROVIDER_SITE_OTHER)
Admission: RE | Admit: 2013-08-10 | Discharge: 2013-08-10 | Disposition: A | Discharge status: Home / Self Care | Payer: Medicare Other | Source: Ambulatory Visit | Attending: Vascular Surgery | Admitting: Vascular Surgery

## 2013-08-10 ENCOUNTER — Encounter: Payer: Self-pay | Admitting: Vascular Surgery

## 2013-08-10 VITALS — BP 133/102 | HR 91 | Ht 71.0 in | Wt 211.8 lb

## 2013-08-10 DIAGNOSIS — I714 Abdominal aortic aneurysm, without rupture, unspecified: Secondary | ICD-10-CM | POA: Insufficient documentation

## 2013-08-10 DIAGNOSIS — Z01810 Encounter for preprocedural cardiovascular examination: Secondary | ICD-10-CM | POA: Insufficient documentation

## 2013-08-10 NOTE — Progress Notes (Signed)
[facility] HISTORY AND PHYSICAL    History of Present Illness:  Patient is a 72 y.o. year old male who presents for evaluation of abdominal aortic aneurysm.  The aneurysm is currently 5.5 cm in diameter by CT scan.  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 I reviewed these images today. The most recent abdominal CT scan was in May of 2013. The patient denies abdominal pain.  The patient denies back pain.  The patient has family history of AAA in his brother.  Other medical problems include stage 3 lung cancer dormant status post chemo radiation. 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 He denies significant cardiac history. He is also being considered for left knee replacement by Dr. Cleophas Dunker. Other chronic medical problems include hyperlipidemia, COPD, possible cirrhosis by CT scan. All these are currently stable.    Past Medical History  Diagnosis Date  . History of colon polyps 2006,2008  . 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 Hyperlipidemia     takes Pravastatin  . Emphysema   . Lung cancer     right upper lobe  . Enlarged prostate     takes Rapaflo daily  . Arthritis     left knee and back arthrtis  . Hypothyroidism     takes Synthroid daily  . Cataract   . Hx of radiation therapy 09/02/11 to 10/19/11    chest  . Diverticulosis   . Cirrhosis     By radiography, seen on CT  . Rosacea       Past Surgical History   Procedure  Date   .  Hernia repair  70's   .  Vasectomy  70's   .  Finger surgery  2008       left pointer finger   .  Knee arthroscopy  2008       left knee   .  Tonsillectomy         as a child   .  Colonoscopy  2008   .  Cardiac catheterization  2003   .  Esophagogastroduodenoscopy     .  Portacath placement  08/10/2011       Procedure: INSERTION PORT-A-CATH;  Surgeon: Norton Blizzard, MD;   Location[facility] OR;  Service: Thoracic;  Laterality: Left;  Left Subclavian      Social History History   Substance Use Topics   .  Smoking status:  Former Smoker -- 1.5 packs/day for 40 years       Types:  Cigarettes       Quit date:  07/02/2011   .  Smokeless tobacco:  Former Neurosurgeon   .  Alcohol Use:  Yes         social     Family History Family History   Problem  Relation  Age of Onset   .  Anesthesia problems  Neg Hx     .  Hypotension  Neg Hx     .  Malignant hyperthermia  Neg Hx     .  Pseudochol deficiency  Neg Hx     .  Cancer  Father     .  Diabetes  Father     .  Hyperlipidemia  Brother     .  Aneurysm  Brother  Abdominal aortic aneurysm     Allergies  No Known Allergies     Current Outpatient Prescriptions on File Prior to Visit  Medication Sig Dispense Refill  . Misc Natural Products (PROSTATE HEALTH) CAPS Take by mouth.        . Multiple Vitamins-Minerals (MULTIVITAMINS THER. W/MINERALS) TABS Take 1 tablet by mouth daily.        Marland Kitchen SYNTHROID 175 MCG tablet Take 175 mcg by mouth daily.       . tamsulosin (FLOMAX) 0.4 MG CAPS Take 0.4 mg by mouth daily.      Marland Kitchen tiotropium (SPIRIVA) 18 MCG inhalation capsule Place 18 mcg into inhaler and inhale daily.      Marland Kitchen FINACEA 15 % cream Apply 1 application topically as directed.      Marland Kitchen HYDROcodone-acetaminophen (NORCO) 5-325 MG per tablet Take 1 tablet by mouth every 6 (six) hours as needed for pain.  30 tablet  0  . lidocaine-prilocaine (EMLA) cream Apply 1 application topically as needed.        . nystatin-triamcinolone (MYCOLOG II) cream Apply 1 application topically daily as needed.        No current facility-administered medications on file prior to visit.    ROS:    General:  No weight loss, Fever, chills, has general fatigue  HEENT: No recent headaches, no nasal bleeding, no visual changes, no sore throat  Neurologic: No dizziness, blackouts, seizures. No recent symptoms of stroke or mini- stroke. No  recent episodes of slurred speech, or temporary blindness.  Cardiac: No recent episodes of chest pain/pressure, no shortness of breath at rest.  Has shortness of breath with exertion.  Denies history of atrial fibrillation or irregular heartbeat  Vascular: No history of rest pain in feet.  No history of claudication.  No history of non-healing ulcer, No history of DVT    Pulmonary: No home oxygen, chronic cough, no hemoptysis,  No asthma or wheezing  Musculoskeletal:  [x ] Arthritis, [x ] Low back pain,  [x ] Joint pain  Hematologic:No history of hypercoagulable state.  No history of easy bleeding.  No history of anemia  Gastrointestinal: No hematochezia or melena,  No gastroesophageal reflux, no trouble swallowing  Urinary: [ ]  chronic Kidney disease, [ ]  on HD - [ ]  MWF or [ ]  TTHS, [ ]  Burning with urination, [ ]  Frequent urination, [ ]  Difficulty urinating;    Skin: No rashes  Psychological: No history of anxiety,  No history of depression   Physical Examination     Filed Vitals:   08/10/13 1004  BP: 133/102  Pulse: 91  Height: 5\' 11"  (1.803 m)  Weight: 211 lb 12.8 oz (96.072 kg)  SpO2: 97%   General:  Alert and oriented, no acute distress HEENT: Normal Neck: No bruit or JVD Pulmonary: Clear to auscultation bilaterally Cardiac: Regular Rate and Rhythm without murmur Gastrointestinal: Soft, non-tender, non-distended, vaguely palpable pulsatile epigastric mass Skin: No rash Extremity Pulses:  2+ radial, brachial, femoral, dorsalis pedis, posterior tibial pulses bilaterally Musculoskeletal: No deformity or edema     Neurologic: Upper and lower extremity motor 5/5 and symmetric    ASSESSMENT: Patient with large abdominal and iliac artery aneurysms. Stage III lung cancer currently dormant. I discussed with the patient today the procedural details of aneurysm stent graft during or possible open aneurysm repair. With a stent graft, this would require bilateral coil  embolization of his internal iliac arteries. There is some low but present risk of colon infarction  as well as buttock claudication sexual dysfunction with this. I also discussed with the patient that open aneurysm repair would be a much larger operation with risks of transfusion, and high risk of pulmonary events. They're also be longer recovery time. Risk of aneurysm rupture as well as signs and symptoms were discussed with the patient today.  The patient will think about both of these techniques and decide which way he wishes to have the aneurysm repaired and get back to me in the near future. I would not proceed with total knee replacement until the aneurysms have been addressed. The patient would also need a repeat CT angiogram of the abdomen and pelvis prior to proceeding either open or stent graft repair. He will also need preoperative cardiac risk stratification. If he decides to go with stent graft repair, we would do staged coil embolization to his internal iliac arteries prior to placing the stent graft.   Fabienne Bruns, MD [facility] Office: [phone] Pager: [phone]

## 2013-08-11 ENCOUNTER — Encounter: Payer: Self-pay | Admitting: Internal Medicine

## 2013-08-14 ENCOUNTER — Telehealth: Payer: Self-pay

## 2013-08-14 DIAGNOSIS — Z23 Encounter for immunization: Secondary | ICD-10-CM

## 2013-08-14 NOTE — Telephone Encounter (Signed)
Left message to call back so we can set up Twinrix year booster injection, due 09/07/2013.

## 2013-08-14 NOTE — Telephone Encounter (Signed)
Patient had called back and left me a message to set up the appointment and to leave it on his machine.  It is set up for 09/07/13 at 2:00pm.  Order put in for the Twinrix.

## 2013-08-15 ENCOUNTER — Telehealth: Payer: Self-pay

## 2013-08-15 ENCOUNTER — Other Ambulatory Visit: Payer: Self-pay | Admitting: *Deleted

## 2013-08-15 DIAGNOSIS — I714 Abdominal aortic aneurysm, without rupture, unspecified: Secondary | ICD-10-CM

## 2013-08-15 NOTE — Telephone Encounter (Signed)
Message copied by Annett Fabian on Tue Aug 15, 2013 10:47 AM ------      Message from: Stan Head E      Created: Mon Aug 14, 2013  4:09 PM      Regarding: needs rev       I have a request to clear him for knee replacement - have not seen in 6+ months      Has cirrhosis      Please schedule REV ------

## 2013-08-15 NOTE — Telephone Encounter (Signed)
Patient advised He reported he is found to have an aneurysm and knee surgery has been postponed.  He will come in and see Dr. Leone Payor on 09/07/13 and get his 4th Twinrix on that date

## 2013-08-16 ENCOUNTER — Telehealth: Payer: Self-pay | Admitting: Vascular Surgery

## 2013-08-16 NOTE — Telephone Encounter (Addendum)
Message copied by Fredrich Birks on Wed Aug 16, 2013 11:02 AM ------      Message from: Melene Plan      Created: Tue Aug 15, 2013 10:18 AM       Sorry I forgot to tell you he will be on vacation 12/22-1/2      ----- Message -----         From: Sherren Kerns, MD         Sent: 08/14/2013   4:40 PM           To: Melene Plan, RN            Needs repeat CTA first.  Needs John to review films.  After I see his CT we can decide on procedure dates. Will coil probably right side first followed by left a few weeks later both in PV.  Will then do stent after that.  Need CT first to make sure anatomy has not changed.            Charles      ----- Message -----         From: Melene Plan, RN         Sent: 08/14/2013   1:59 PM           To: Sherren Kerns, MD, Conley Simmonds Pullins, RN            This pt wants to schedule "one of his 3" surgeries. I imagine it is the coiling before EVAR. Would you want to do this in the PV and bilateral  or which one first? Doesn't want until 3rd week or so in Jan.      Thanks      Judy             ------  08/16/13: spoke with pt to schedule appt/ faxed order for bloodwork to [facility], dpm

## 2013-08-17 ENCOUNTER — Other Ambulatory Visit: Payer: Self-pay | Admitting: Vascular Surgery

## 2013-08-30 ENCOUNTER — Ambulatory Visit (HOSPITAL_BASED_OUTPATIENT_CLINIC_OR_DEPARTMENT_OTHER): Payer: Medicare Other

## 2013-08-30 VITALS — BP 140/94 | HR 90 | Temp 97.0°F

## 2013-08-30 DIAGNOSIS — C3410 Malignant neoplasm of upper lobe, unspecified bronchus or lung: Secondary | ICD-10-CM

## 2013-08-30 DIAGNOSIS — Z452 Encounter for adjustment and management of vascular access device: Secondary | ICD-10-CM

## 2013-08-30 DIAGNOSIS — Z95828 Presence of other vascular implants and grafts: Secondary | ICD-10-CM

## 2013-08-30 MED ORDER — SODIUM CHLORIDE 0.9 % IJ SOLN
10.0000 mL | INTRAMUSCULAR | Status: DC | PRN
  Administered 2013-08-30: 10 mL via INTRAVENOUS
  Filled 2013-08-30: qty 10

## 2013-08-30 MED ORDER — HEPARIN SOD (PORK) LOCK FLUSH 100 UNIT/ML IV SOLN
500.0000 [IU] | Freq: Once | INTRAVENOUS | Status: AC
  Administered 2013-08-30: 500 [IU] via INTRAVENOUS
  Filled 2013-08-30: qty 5

## 2013-08-30 NOTE — Patient Instructions (Signed)
Implanted Port Instructions  An implanted port is a central line that has a round shape and is placed under the skin. It is used for long-term IV (intravenous) access for:  · Medicine.  · Fluids.  · Liquid nutrition, such as TPN (total parenteral nutrition).  · Blood samples.  Ports can be placed:  · In the chest area just below the collarbone (this is the most common place.)  · In the arms.  · In the belly (abdomen) area.  · In the legs.  PARTS OF THE PORT  A port has 2 main parts:  · The reservoir. The reservoir is round, disc-shaped, and will be a small, raised area under your skin.  · The reservoir is the part where a needle is inserted (accessed) to either give medicines or to draw blood.  · The catheter. The catheter is a long, slender tube that extends from the reservoir. The catheter is placed into a large vein.  · Medicine that is inserted into the reservoir goes into the catheter and then into the vein.  INSERTION OF THE PORT  · The port is surgically placed in either an operating room or in a procedural area (interventional radiology).  · Medicine may be given to help you relax during the procedure.  · The skin where the port will be inserted is numbed (local anesthetic).  · 1 or 2 small cuts (incisions) will be made in the skin to insert the port.  · The port can be used after it has been inserted.  INCISION SITE CARE  · The incision site may have small adhesive strips on it. This helps keep the incision site closed. Sometimes, no adhesive strips are placed. Instead of adhesive strips, a special kind of surgical glue is used to keep the incision closed.  · If adhesive strips were placed on the incision sites, do not take them off. They will fall off on their own.  · The incision site may be sore for 1 to 2 days. Pain medicine can help.  · Do not get the incision site wet. Bathe or shower as directed by your caregiver.  · The incision site should heal in 5 to 7 days. A small scar may form after the  incision has healed.  ACCESSING THE PORT  Special steps must be taken to access the port:  · Before the port is accessed, a numbing cream can be placed on the skin. This helps numb the skin over the port site.  · A sterile technique is used to access the port.  · The port is accessed with a needle. Only "non-coring" port needles should be used to access the port. Once the port is accessed, a blood return should be checked. This helps ensure the port is in the vein and is not clogged (clotted).  · If your caregiver believes your port should remain accessed, a clear (transparent) bandage will be placed over the needle site. The bandage and needle will need to be changed every week or as directed by your caregiver.  · Keep the bandage covering the needle clean and dry. Do not get it wet. Follow your caregiver's instructions on how to take a shower or bath when the port is accessed.  · If your port does not need to stay accessed, no bandage is needed over the port.  FLUSHING THE PORT  Flushing the port keeps it from getting clogged. How often the port is flushed depends on:  · If a   constant infusion is running. If a constant infusion is running, the port may not need to be flushed.  · If intermittent medicines are given.  · If the port is not being used.  For intermittent medicines:  · The port will need to be flushed:  · After medicines have been given.  · After blood has been drawn.  · As part of routine maintenance.  · A port is normally flushed with:  · Normal saline.  · Heparin.  · Follow your caregiver's advice on how often, how much, and the type of flush to use on your port.  IMPORTANT PORT INFORMATION  · Tell your caregiver if you are allergic to heparin.  · After your port is placed, you will get a manufacturer's information card. The card has information about your port. Keep this card with you at all times.  · There are many types of ports available. Know what kind of port you have.  · In case of an  emergency, it may be helpful to wear a medical alert bracelet. This can help alert health care workers that you have a port.  · The port can stay in for as long as your caregiver believes it is necessary.  · When it is time for the port to come out, surgery will be done to remove it. The surgery will be similar to how the port was put in.  · If you are in the hospital or clinic:  · Your port will be taken care of and flushed by a nurse.  · If you are at home:  · A home health care nurse may give medicines and take care of the port.  · You or a family member can get special training and directions for giving medicine and taking care of the port at home.  SEEK IMMEDIATE MEDICAL CARE IF:   · Your port does not flush or you are unable to get a blood return.  · New drainage or pus is coming from the incision.  · A bad smell is coming from the incision site.  · You develop swelling or increased redness at the incision site.  · You develop increased swelling or pain at the port site.  · You develop swelling or pain in the surrounding skin near the port.  · You have an oral temperature above 102° F (38.9° C), not controlled by medicine.  MAKE SURE YOU:   · Understand these instructions.  · Will watch your condition.  · Will get help right away if you are not doing well or get worse.  Document Released: 08/17/2005 Document Revised: 11/09/2011 Document Reviewed: 11/08/2008  [employer]® Patient Information ©2014 [employer].

## 2013-08-31 DIAGNOSIS — Z9289 Personal history of other medical treatment: Secondary | ICD-10-CM

## 2013-08-31 HISTORY — DX: Personal history of other medical treatment: Z92.89

## 2013-09-04 ENCOUNTER — Other Ambulatory Visit: Payer: Medicare Other

## 2013-09-05 ENCOUNTER — Ambulatory Visit
Admission: RE | Admit: 2013-09-05 | Discharge: 2013-09-05 | Disposition: A | Discharge status: Home / Self Care | Payer: Medicare Other | Source: Ambulatory Visit | Attending: Vascular Surgery | Admitting: Vascular Surgery

## 2013-09-05 DIAGNOSIS — I714 Abdominal aortic aneurysm, without rupture, unspecified: Secondary | ICD-10-CM

## 2013-09-05 MED ORDER — IOHEXOL 350 MG/ML SOLN
80.0000 mL | Freq: Once | INTRAVENOUS | Status: AC | PRN
  Administered 2013-09-05: 80 mL via INTRAVENOUS

## 2013-09-07 ENCOUNTER — Encounter: Payer: Self-pay | Admitting: Internal Medicine

## 2013-09-07 ENCOUNTER — Ambulatory Visit (INDEPENDENT_AMBULATORY_CARE_PROVIDER_SITE_OTHER): Payer: Medicare Other | Admitting: Internal Medicine

## 2013-09-07 ENCOUNTER — Other Ambulatory Visit (INDEPENDENT_AMBULATORY_CARE_PROVIDER_SITE_OTHER): Payer: Medicare Other

## 2013-09-07 VITALS — BP 128/90 | HR 88 | Ht 72.0 in | Wt 214.0 lb

## 2013-09-07 DIAGNOSIS — Z23 Encounter for immunization: Secondary | ICD-10-CM

## 2013-09-07 DIAGNOSIS — K7460 Unspecified cirrhosis of liver: Secondary | ICD-10-CM

## 2013-09-07 LAB — APTT: aPTT: 28.8 s (ref 21.7–28.8)

## 2013-09-07 LAB — PROTIME-INR
INR: 1.2 ratio — ABNORMAL HIGH (ref 0.8–1.0)
PROTHROMBIN TIME: 12.4 s (ref 10.2–12.4)

## 2013-09-07 NOTE — Patient Instructions (Signed)
Your physician has requested that you go to the basement for the following lab work before leaving today: PT/INR, PTT  Today you have been given a Twinrix vaccine.   I appreciate the opportunity to care for you.

## 2013-09-07 NOTE — Progress Notes (Signed)
Subjective:    Patient ID: Joseph [name], male    DOB: 1940-10-19, 73 y.o.   MRN: [id number]  HPI Patient returns for followup. He is not having any specific gastrointestinal complaints. He was advised to make this appointment prior to any replacement surgery by Dr. Durward Fortes. I was asked for preoperative clearance because of this liver disease question. In the interim he has seen Dr. Oneida Alar and he will have an abdominal aortic aneurysm repair. This will receive any knee replacement.  No Known Allergies Outpatient Prescriptions Prior to Visit  Medication Sig Dispense Refill  . FINACEA 15 % cream Apply 1 application topically as directed.      Marland Kitchen HYDROcodone-acetaminophen (NORCO) 5-325 MG per tablet Take 1 tablet by mouth every 6 (six) hours as needed for pain.  30 tablet  0  . lidocaine-prilocaine (EMLA) cream Apply 1 application topically as needed.        . Multiple Vitamins-Minerals (MULTIVITAMINS THER. W/MINERALS) TABS Take 1 tablet by mouth daily.        Marland Kitchen nystatin-triamcinolone (MYCOLOG II) cream Apply 1 application topically daily as needed.       Marland Kitchen SYNTHROID 175 MCG tablet Take 175 mcg by mouth daily.       . tamsulosin (FLOMAX) 0.4 MG CAPS Take 0.4 mg by mouth daily.      Marland Kitchen tiotropium (SPIRIVA) 18 MCG inhalation capsule Place 18 mcg into inhaler and inhale daily.      . Milk Thistle 200 MG CAPS Take 1 capsule by mouth daily.      . Misc Natural Products (PROSTATE HEALTH) CAPS Take by mouth.         No facility-administered medications prior to visit.   Past Medical History  Diagnosis Date  . History of colon polyps 2006,2008  . 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 Hyperlipidemia     takes Pravastatin  . Emphysema   . Lung cancer     right upper lobe  . Enlarged prostate     takes Rapaflo daily  . Arthritis     left knee and back arthrtis  . Hypothyroidism     takes Synthroid daily  . Cataract   . Hx of radiation therapy 09/02/11 to 10/19/11    chest  .  Diverticulosis   . Cirrhosis     By radiography, seen on CT  . Rosacea    Past Surgical History  Procedure Laterality Date  . Hernia repair  70's  . Vasectomy  70's  . Finger surgery  2008    left pointer finger  . Knee arthroscopy  2008    left knee  . Tonsillectomy      as a child  . Colonoscopy w/ biopsies      multiple   . Cardiac catheterization  2003  . Esophagogastroduodenoscopy  2006  . Portacath placement  08/10/2011    Procedure: INSERTION PORT-A-CATH;  Surgeon: Pierre Bali, MD;  Location: [facility] OR;  Service: Thoracic;  Laterality: Left;  Left Subclavian   Review of Systems Right shldr is sore on the posterior     Objective:   Physical Exam General:  NAD Eyes:   anicteric Lungs:  clear Heart:  S1S2 no rubs, murmurs or gallops, no HSM Abdomen:  soft and nontender, BS+ Ext:   no edema Back:  There is an area of muscle spasm in the right posterior shoulder area in between the spine and the medial  aspect of the scapula.    Assessment & Plan:      . Cirrhosis of liver without mention of alcohol

## 2013-09-07 NOTE — Assessment & Plan Note (Addendum)
stable as far as I can tell, he has mild thrombocytopenia. He has good liver function as best I can tell and I don't think he is at significantly increased operative risk. Especially with his abdominal aortic aneurysm surgery that's going to take priority. As far as an elective knee replacement is concerned I don't think he has considerable bleeding risk or anesthesia risk.  check a protime INR and PTT today. Lab Results  Component Value Date   INR 1.2* 09/07/2013   INR 1.07 08/04/2011   PTT NL at 28.8   CC: Janalyn Rouse, MD Dr. Durward Fortes  Dr. Oneida Alar

## 2013-09-08 ENCOUNTER — Encounter: Payer: Self-pay | Admitting: Vascular Surgery

## 2013-09-19 ENCOUNTER — Encounter: Payer: Self-pay | Admitting: Internal Medicine

## 2013-09-19 NOTE — Progress Notes (Signed)
Patient ID: Joseph [name], male   DOB: [birth year], 73 y.o.   MRN: [id number] Faxed a pre-operative clearance to [phone] [facility] , form sent to be scanned in.

## 2013-09-20 ENCOUNTER — Encounter: Payer: Self-pay | Admitting: Vascular Surgery

## 2013-09-21 ENCOUNTER — Encounter: Payer: Self-pay | Admitting: Vascular Surgery

## 2013-09-21 ENCOUNTER — Ambulatory Visit (INDEPENDENT_AMBULATORY_CARE_PROVIDER_SITE_OTHER): Payer: Medicare Other | Admitting: Vascular Surgery

## 2013-09-21 VITALS — BP 118/64 | HR 98 | Ht 72.0 in | Wt 217.6 lb

## 2013-09-21 DIAGNOSIS — I714 Abdominal aortic aneurysm, without rupture, unspecified: Secondary | ICD-10-CM

## 2013-09-21 NOTE — Progress Notes (Signed)
[facility] [place] HISTORY AND PHYSICAL    History of Present Illness:  Patient is a 73 y.o. year old male who presents for evaluation of abdominal aortic aneurysm.  Patient returns today after recent CT scan of the abdomen and pelvis did discuss an intervention The patient denies abdominal pain.  The patient denies back pain.  The patient has family history of AAA in his brother.  Other medical problems include stage 3 lung cancer dormant status post chemo radiation. 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 He denies significant cardiac history. He is also being considered for left knee replacement by Dr. Durward Fortes. Other chronic medical problems include hyperlipidemia, COPD, possible cirrhosis by CT scan. All these are currently stable.    Past Medical History   Diagnosis  Date   .  History of colon polyps  2006,2008   .  Abdominal aneurysm         4.4cm   .  Hyperlipidemia         takes Pravastatin   .  Emphysema     .  Lung cancer         right upper lobe   .  Enlarged prostate         takes Rapaflo daily   .  Arthritis         left knee and back arthrtis   .  Hypothyroidism         takes Synthroid daily   .  Cataract     .  Hx of radiation therapy  09/02/11 to 10/19/11       chest   .  Diverticulosis     .  Cirrhosis         By radiography, seen on CT   .  Rosacea         Past Surgical History    Procedure   Date    .   Hernia repair   70's    .   Vasectomy   70's    .   Finger surgery   2008          left pointer finger    .   Knee arthroscopy   2008          left knee    .   Tonsillectomy             as a child    .   Colonoscopy   2008    .   Cardiac catheterization   2003    .   Esophagogastroduodenoscopy       .   Portacath placement   08/10/2011          Procedure: INSERTION PORT-A-CATH;  Surgeon: Pierre Bali, MD;  Location: [facility] OR;  Service: Thoracic;  Laterality: Left;  Left Subclavian        Social History History    Substance Use Topics    .   Smoking status:   Former Smoker -- 1.5 packs/day for 40 years          Types:   Cigarettes          Quit date:   07/02/2011    .   Smokeless tobacco:   Former Systems developer    .   Alcohol Use:   Yes             social      Family  History Family History    Problem   Relation   Age of Onset    .   Anesthesia problems   Neg Hx       .   Hypotension   Neg Hx       .   Malignant hyperthermia   Neg Hx       .   Pseudochol deficiency   Neg Hx       .   Cancer   Father       .   Diabetes   Father       .   Hyperlipidemia   Brother       .   Aneurysm   Brother             Abdominal aortic aneurysm      Allergies  No Known Allergies     Current Outpatient Prescriptions on File Prior to Visit   Medication  Sig  Dispense  Refill   .  Misc Natural Products (PROSTATE HEALTH) CAPS  Take by mouth.           .  Multiple Vitamins-Minerals (MULTIVITAMINS THER. W/MINERALS) TABS  Take 1 tablet by mouth daily.           Marland Kitchen  SYNTHROID 175 MCG tablet  Take 175 mcg by mouth daily.          .  tamsulosin (FLOMAX) 0.4 MG CAPS  Take 0.4 mg by mouth daily.         Marland Kitchen  tiotropium (SPIRIVA) 18 MCG inhalation capsule  Place 18 mcg into inhaler and inhale daily.         Marland Kitchen  FINACEA 15 % cream  Apply 1 application topically as directed.         Marland Kitchen  HYDROcodone-acetaminophen (NORCO) 5-325 MG per tablet  Take 1 tablet by mouth every 6 (six) hours as needed for pain.   30 tablet   0   .  lidocaine-prilocaine (EMLA) cream  Apply 1 application topically as needed.           .  nystatin-triamcinolone (MYCOLOG II) cream  Apply 1 application topically daily as needed.             No current facility-administered medications on file prior to visit.     ROS:    General:  No weight loss, Fever, chills, has general fatigue  HEENT: No recent headaches, no nasal bleeding, no visual changes, no sore throat  Neurologic: No dizziness, blackouts, seizures. No recent  symptoms of stroke or mini- stroke. No recent episodes of slurred speech, or temporary blindness.  Cardiac: No recent episodes of chest pain/pressure, no shortness of breath at rest.  Has shortness of breath with exertion.  Denies history of atrial fibrillation or irregular heartbeat  Vascular: No history of rest pain in feet.  No history of claudication.  No history of non-healing ulcer, No history of DVT    Pulmonary: No home oxygen, chronic cough, no hemoptysis,  No asthma or wheezing  Musculoskeletal:  [x ] Arthritis, [x ] Low back pain,  [x ] Joint pain  Hematologic:No history of hypercoagulable state.  No history of easy bleeding.  No history of anemia  Gastrointestinal: No hematochezia or melena,  No gastroesophageal reflux, no trouble swallowing  Urinary: [ ]  chronic Kidney disease, [ ]  on HD - [ ]  MWF or [ ]  TTHS, [ ]  Burning with urination, [ ]  Frequent urination, [ ]  Difficulty urinating;  Skin: No rashes  Psychological: No history of anxiety,  No history of depression   Physical Examination       Filed Vitals:   09/21/13 1056  BP: 118/64  Pulse: 98  Height: 6' (1.829 m)  Weight: 217 lb 9.6 oz (98.703 kg)  SpO2: 97%   General:  Alert and oriented, no acute distress HEENT: Normal Neck: No bruit or JVD Pulmonary: Clear to auscultation bilaterally Cardiac: Regular Rate and Rhythm without murmur Gastrointestinal: Soft, non-tender, non-distended, vaguely palpable pulsatile epigastric mass Skin: No rash Extremity Pulses:  2+ radial, brachial, femoral, dorsalis pedis, posterior tibial pulses bilaterally Musculoskeletal: No deformity or edema     Neurologic: Upper and lower extremity motor 5/5 and symmetric  Data: CT scan of abdomen and pelvis is reviewed today.  Infrarenal abdominal aortic aneurysm has enlarged since 2013.  Aneurysm now measures up to 6.2 cm. Aneurysm extends into the common iliac arteries bilaterally. Enlargement of the common iliac arteries   bilaterally. Left common iliac artery aneurysm measures up to 5.1 cm and the right measures 3.1 cm. 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   ASSESSMENT: Patient with large abdominal and iliac artery aneurysms. Stage III lung cancer currently dormant. I discussed with the patient today the procedural details of aneurysm stent graft during or possible open aneurysm repair. With a stent graft, this would require bilateral coil embolization of his internal iliac arteries. There is some low but present risk of colon infarction as well as buttock claudication sexual dysfunction with this. I also discussed with the patient that open aneurysm repair would be a much larger operation with risks of transfusion, and high risk of pulmonary events. They're also be longer recovery time. Risk of aneurysm rupture as well as signs and symptoms were discussed with the patient today.  at this point the patient wishes to proceed aneurysm stent graft repair. He will be scheduled for coil embolization of the internal iliac arteries and staged fashion. This will be followed by aneurysm stent graft repair. He will also need preoperative cardiac risk stratification. All these will be set up in the next few weeks.   Ruta Hinds, MD [facility] [place] Office: [phone] Pager: [phone]

## 2013-09-26 ENCOUNTER — Other Ambulatory Visit: Payer: Self-pay | Admitting: *Deleted

## 2013-09-29 ENCOUNTER — Encounter: Payer: Self-pay | Admitting: Internal Medicine

## 2013-09-29 ENCOUNTER — Encounter: Payer: Self-pay | Admitting: Cardiology

## 2013-09-29 ENCOUNTER — Ambulatory Visit (INDEPENDENT_AMBULATORY_CARE_PROVIDER_SITE_OTHER): Payer: Medicare Other | Admitting: Cardiology

## 2013-09-29 VITALS — BP 124/66 | HR 94 | Ht 72.0 in | Wt 222.0 lb

## 2013-09-29 DIAGNOSIS — I2510 Atherosclerotic heart disease of native coronary artery without angina pectoris: Secondary | ICD-10-CM | POA: Insufficient documentation

## 2013-09-29 NOTE — Progress Notes (Signed)
Patient ID: FISHEL WAMBLE, male   DOB: 15-Jul-1941, 73 y.o.   MRN: [id number]     Patient Name: Joseph [name] Date of Encounter: 09/29/2013  Primary Care Provider:  Janalyn Rouse, MD Primary Cardiologist:  Ena Dawley, H  Problem List   Past Medical History  Diagnosis Date  . History of colon polyps 2006,2008  . 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 Hyperlipidemia     takes Pravastatin  . Emphysema   . Lung cancer     right upper lobe  . Enlarged prostate     takes Rapaflo daily  . Arthritis     left knee and back arthrtis  . Hypothyroidism     takes Synthroid daily  . Cataract   . Hx of radiation therapy 09/02/11 to 10/19/11    chest  . Diverticulosis   . Cirrhosis     By radiography, seen on CT  . Rosacea    Past Surgical History  Procedure Laterality Date  . Hernia repair  70's  . Vasectomy  70's  . Finger surgery  2008    left pointer finger  . Knee arthroscopy  2008    left knee  . Tonsillectomy      as a child  . Colonoscopy w/ biopsies      multiple   . Cardiac catheterization  2003  . Esophagogastroduodenoscopy  2006  . Portacath placement  08/10/2011    Procedure: INSERTION PORT-A-CATH;  Surgeon: Pierre Bali, MD;  Location: [facility] OR;  Service: Thoracic;  Laterality: Left;  Left Subclavian    Allergies  No Known Allergies  HPI  73 year old male with prior medical history of hyperlipidemia, known nonobstructive coronary artery disease was being referred to [place] for preop evaluation prior to 4.4 cm AAA repair. The patient states that approximately 10 years ago he was evaluated for coronary artery disease because of abnormal EKG that was followed by abnormal stress test and a cardiac catheterization that only showed minimal atherosclerotic plaque. The patient also has a history of lung cancer for which she has been treated with chemotherapy and radiation therapy in 2012 and is currently in remission. The patient quit smoking 2 years ago and is  being treated for COPD and emphysema with Spiriva. He is fairly active and despite his past medical history and pain he is using the elliptical machine and he can walk up to an hour without limitation in regards to chest pain or shortness of breath. He denies any palpitations or syncope as well as orthopnea or paroxysmal nocturnal dyspnea. The patient has a family history of AAA in his brother. He has known hyperlipidemia however he developed liver enzymes elevation after his chemotherapy and is being followed a GI specialist Dr. Charlott Holler, he has never been started on statins. He denies any alcohol intake  Home Medications  Prior to Admission medications   Medication Sig Start Date End Date Taking? Authorizing Provider  doxycycline (VIBRAMYCIN) 100 MG capsule Take 100 mg by mouth 2 (two) times daily.   Yes Historical Provider, MD  FINACEA 15 % cream Apply 1 application topically as directed. 12/01/12  Yes Historical Provider, MD  HYDROcodone-acetaminophen (NORCO) 5-325 MG per tablet Take 1 tablet by mouth every 6 (six) hours as needed for pain. 01/04/12  Yes Carlton Adam, PA-C  lidocaine-prilocaine (EMLA) cream Apply 1 application topically as needed.   08/06/11  Yes Curt Bears, MD  metroNIDAZOLE (METROGEL) 0.75 % gel Apply 1 application topically 2 (  two) times daily.   Yes Historical Provider, MD  MILK THISTLE PO Take 1 capsule by mouth daily. Take one 1200 mg capsule daily   Yes Historical Provider, MD  Multiple Vitamins-Minerals (MULTIVITAMINS THER. W/MINERALS) TABS Take 1 tablet by mouth daily.     Yes Historical Provider, MD  nystatin-triamcinolone (MYCOLOG II) cream Apply 1 application topically daily as needed.  07/10/11  Yes Historical Provider, MD  OVER THE COUNTER MEDICATION 30 mg 3 (three) times daily. LUNG SUPPORT   Yes Historical Provider, MD  OVER THE COUNTER MEDICATION [place]  TAIL MUSHROOM  Takes 1 gr daily   Yes Historical Provider, MD  SYNTHROID 175 MCG tablet Take 175 mcg by  mouth daily.  06/08/11  Yes Stephens Shire, MD  tamsulosin (FLOMAX) 0.4 MG CAPS Take 0.4 mg by mouth daily.   Yes Historical Provider, MD  tiotropium (SPIRIVA) 18 MCG inhalation capsule Place 18 mcg into inhaler and inhale daily.   Yes Historical Provider, MD    Family History  Family History  Problem Relation Age of Onset  . Anesthesia problems Neg Hx   . Hypotension Neg Hx   . Malignant hyperthermia Neg Hx   . Pseudochol deficiency Neg Hx   . Cancer Father   . Diabetes Father   . Hyperlipidemia Brother   . Aneurysm Brother     Abdominal aortic aneurysm  . Hyperlipidemia Mother   . Hyperlipidemia Sister     Social History  History   Social History  . Marital Status: Married    Spouse Name: N/A    Number of Children: N/A  . Years of Education: N/A   Occupational History  . Not on file.   Social History Main Topics  . Smoking status: Former Smoker -- 1.50 packs/day for 40 years    Types: Cigarettes    Quit date: 07/02/2011  . Smokeless tobacco: Former Systems developer  . Alcohol Use: 1.8 oz/week    3 Cans of beer per week     Comment: social  . Drug Use: No  . Sexual Activity: Not on file   Other Topics Concern  . Not on file   Social History Narrative  . No narrative on file     Review of Systems, as per HPI, otherwise negative General:  No chills, fever, night sweats or weight changes.  Cardiovascular:  No chest pain, dyspnea on exertion, edema, orthopnea, palpitations, paroxysmal nocturnal dyspnea. Dermatological: No rash, lesions/masses Respiratory: No cough, dyspnea Urologic: No hematuria, dysuria Abdominal:   No nausea, vomiting, diarrhea, bright red blood per rectum, melena, or hematemesis Neurologic:  No visual changes, wkns, changes in mental status. All other systems reviewed and are otherwise negative except as noted above.  Physical Exam  Blood pressure 124/66, pulse 94, height 6' (1.829 m), weight 222 lb (100.699 kg).  General: Pleasant, NAD Psych:  Normal affect. Neuro: Alert and oriented X 3. Moves all extremities spontaneously. HEENT: Normal  Neck: Supple without bruits or JVD. Lungs:  Resp regular and unlabored, CTA. Heart: RRR no s3, s4, or murmurs. Abdomen: Soft, non-tender, non-distended, BS + x 4.  Extremities: No clubbing, cyanosis or edema. DP/PT/Radials 2+ and equal bilaterally.  Labs:  No results found for this basename: CKTOTAL, CKMB, TROPONINI,  in the last 72 hours Lab Results  Component Value Date   WBC 5.1 07/17/2013   HGB 14.6 07/17/2013   HCT 43.2 07/17/2013   MCV 99.2* 07/17/2013   PLT 134* 07/17/2013   No results found for this  basename: NA, K, CL, CO2, BUN, CREATININE, CALCIUM, LABALBU, PROT, BILITOT, ALKPHOS, ALT, AST, GLUCOSE,  in the last 168 hours No results found for this basename: CHOL, HDL, LDLCALC, TRIG   No results found for this basename: DDIMER   No components found with this basename: POCBNP,   Accessory Clinical Findings  Echocardiogram - none  ECG - sinus rhythm, nonspecific ST-T wave abnormalities, abnormal EKG appear   Assessment & Plan  This is a 73 year old male with a history of known nonobstructive coronary artery disease and hyperlipidemia. The patient is currently free of symptoms suggesting angina or congestive heart failure. Preferably he should be treated with statin for history of known coronary artery disease as well as AAA. Because he still has ongoing mild transaminase elevation as well as mild alkaline phosphatase elevation we will refer him to our lipid clinic and consult with his liver doctor. We will also order a transthoracic echocardiogram to reevaluate he is right and left ventricular function considering chemotherapy and radiation therapy and severe emphysema. No stress testing is indicated at this time.  However, the patient is considered intermediate risk for high-risk vascular surgery and unless there is significant abnormality such as severe pulmonary  hypertension on echocardiogram there is currently no contraindication from cardiac standpoint for this patient to undergo his vascular surgery. If possible we would recommend to use a low dose of beta blocker in the perioperative period, however these may be challenging with regards of his COPD and frequent wheezing.  Thank you for referring this patient to [place].  Dorothy Spark, MD, [facility] 09/29/2013, 1:59 PM

## 2013-09-29 NOTE — Patient Instructions (Addendum)
Your physician has requested that you have an echocardiogram. Echocardiography is a painless test that uses sound waves to create images of your heart. It provides your doctor with information about the size and shape of your heart and how well your heart's chambers and valves are working. This procedure takes approximately one hour. There are no restrictions for this procedure.   Your physician recommends that you continue on your current medications as directed. Please refer to the Current Medication list given to you today.  You have been referred to [facility]    Your physician recommends that you schedule a follow-up appointment  as needed

## 2013-10-09 ENCOUNTER — Ambulatory Visit (INDEPENDENT_AMBULATORY_CARE_PROVIDER_SITE_OTHER): Payer: Medicare Other | Admitting: Pharmacist

## 2013-10-09 VITALS — Wt 222.0 lb

## 2013-10-09 DIAGNOSIS — E785 Hyperlipidemia, unspecified: Secondary | ICD-10-CM

## 2013-10-09 MED ORDER — ATORVASTATIN CALCIUM 10 MG PO TABS
10.0000 mg | ORAL_TABLET | Freq: Every day | ORAL | Status: DC
Start: 2013-10-09 — End: 2014-05-31

## 2013-10-09 NOTE — Patient Instructions (Signed)
1.  Start lipitor (atorvastatin) 10 mg once daily. 2.  Call Ysidro Evert if any muscle aches start to occur. 3.  Check lipid panel and liver enzymes in 4 weeks when you see Dr. Brigitte Pulse on 11/01/13 - please have him fax results to Alferd Apa (PharmD, [place], CPP) at [phone]  See Ysidro Evert 1 week after lab work to review (11/09/13 at 11:30 am)

## 2013-10-09 NOTE — Assessment & Plan Note (Addendum)
Given LDL ~ 140 mg/dL, and needing a 50% reduction given his h/o AAA and non-obstructive CAD, we will start patient on a low dose atorvastatin today.  This has been "okayed" by GI physician already.  Pravastatin not likely to be potent enough, thus atorvastatin selected.  Patient is scheduled to follow up with Dr. Brigitte Pulse [facility]) in 4 weeks and will likely be getting blood work at that time.  We would like to get a lipid / liver panel drawn in 4 weeks, and patient would like to have it drawn while he is there if possible.  I gave patient a written order to get a lipid / liver panel drawn and results to be faxed to our office.  If he can't get them drawn there, we will have him come to our office instead.  If LFTs stable in 4 weeks, may need increase to atorvastatin 20 mg if LDL and non-HDL still elevated.  Patient and I discussed heart diease risk vs hepatic injury in the office.  I explained we would keep a close eye on LFTs while using statin in him.  He agrees to call me if any muscle aches or side effects occur before our f/u visit next month.  He is to see me 1 week after his blood work with Dr. Brigitte Pulse. Plan: 1.  Start lipitor (atorvastatin) 10 mg once daily. 2.  Call Joseph [name] if any muscle aches start to occur. 3.  Check lipid panel and liver enzymes in 4 weeks when you see Dr. Brigitte Pulse on 11/01/13 - please have him fax results to Alferd Apa (PharmD, [place], CPP) at [phone] See Joseph [name] 1 week after lab work to review (11/09/13 at 11:30 am)  Note faxed to Dr. Brigitte Pulse

## 2013-10-09 NOTE — Progress Notes (Signed)
Joseph [name] is a pleasant 73 y.o. WM referred to Lipid Clinic by Dr. Meda Coffee given his h/o AAA and non-obstructive disease which is complicated by possible cirrhosis.  Joseph [name]'s liver function have been stable lately, and historically only his ALK Phos and bilirubin have been slightly elevated.  ALT and AST have been relatively normal historically, but slightly elevated in 07/2013.  He had a CT scan which showed his liver margins were slightly irregular and he has seen Dr. Carlean Purl (GI) for this.  Dr. Carlean Purl was consulted on using a statin in Joseph [name], and Dr. Carlean Purl felt it would be okay to do so at this time.  His LFTs became slightly elevated after being treated by oncology for lung cancer - no recurrence of cancer since 2012.  Joseph [name] was treated with pravastatin in 2012 for 1 year, but this was ultimately stopped by his PCP Joseph [name] tells me.  No known reason for stopping pravastatin that I can see in chart, and Joseph [name] doesn't think he was having a problem with it. He thinks maybe it was stopped due to findings on CT scan - irregular liver margins  Joseph [name] needs a 50% LDL reduction given AAA and non-obstructive CAD, so will likely try a more potent statin.  Joseph [name] already has an appointment to f/u with Dr. Brigitte Pulse (PCP) in 4 weeks and Joseph [name] would like to get lab work done there if any is needed.  Risk Factors:  AAA, non-obstructive on cath years ago, low HDL, age - LDL goal < 70, non-HDL goal < 100 Meds:  Phytosterols bid Intolerant:  None  Social history:  Former smoker (quit 2012), rarely drinks alcohol (socially only) Family history:  Brother has AAA also Diet:  Joseph [name] eats a relatively low fat diet.  Breakfast typically eggs whites and/or toast.  Lunch and dinner is often salad, fish (salmon or tilapia), and sometimes pork.  Doesn't eat red meat.  Drinks green tea or water.  Rarely drinks soda. Exercise:  Previously walking on his elliptical machine 3-4 days per week (30 minutes), but since he hurt his  shoulder this has been less.  Labs: Late 2014 (Dr. [name] - PCP with [facility]):  TC 211, TG 164, HDL 38, ~ LDL 140 mg/dL; Alk Phos 199, ALT 63, AST 54, Total Bili 0.86  (not on therapy)  Current Outpatient Prescriptions  Medication Sig Dispense Refill  . PHYTOSTEROLS PO Take 1 capsule by mouth 2 (two) times daily.      Marland Kitchen doxycycline (VIBRAMYCIN) 100 MG capsule Take 100 mg by mouth 2 (two) times daily.      Marland Kitchen FINACEA 15 % cream Apply 1 application topically as directed.      Marland Kitchen HYDROcodone-acetaminophen (NORCO) 5-325 MG per tablet Take 1 tablet by mouth every 6 (six) hours as needed for pain.  30 tablet  0  . lidocaine-prilocaine (EMLA) cream Apply 1 application topically as needed.        . metroNIDAZOLE (METROGEL) 0.75 % gel Apply 1 application topically 2 (two) times daily.      Marland Kitchen MILK THISTLE PO Take 1 capsule by mouth daily. Take one 1200 mg capsule daily      . Multiple Vitamins-Minerals (MULTIVITAMINS THER. W/MINERALS) TABS Take 1 tablet by mouth daily.        Marland Kitchen nystatin-triamcinolone (MYCOLOG II) cream Apply 1 application topically daily as needed.       Marland Kitchen OVER THE COUNTER MEDICATION 30 mg 3 (three) times daily. LUNG SUPPORT      .  OVER THE COUNTER MEDICATION [place]  TAIL MUSHROOM  Takes 1 gr daily      . SYNTHROID 175 MCG tablet Take 175 mcg by mouth daily.       . tamsulosin (FLOMAX) 0.4 MG CAPS Take 0.4 mg by mouth daily.      Marland Kitchen tiotropium (SPIRIVA) 18 MCG inhalation capsule Place 18 mcg into inhaler and inhale daily.       No current facility-administered medications for this visit.   No Known Allergies Family History  Problem Relation Age of Onset  . Anesthesia problems Neg Hx   . Hypotension Neg Hx   . Malignant hyperthermia Neg Hx   . Pseudochol deficiency Neg Hx   . Cancer Father   . Diabetes Father   . Hyperlipidemia Brother   . Aneurysm Brother     Abdominal aortic aneurysm  . Hyperlipidemia Mother   . Hyperlipidemia Sister

## 2013-10-11 ENCOUNTER — Ambulatory Visit (HOSPITAL_BASED_OUTPATIENT_CLINIC_OR_DEPARTMENT_OTHER): Payer: Medicare Other

## 2013-10-11 VITALS — BP 127/86 | HR 105 | Temp 97.1°F

## 2013-10-11 DIAGNOSIS — Z452 Encounter for adjustment and management of vascular access device: Secondary | ICD-10-CM

## 2013-10-11 DIAGNOSIS — C3410 Malignant neoplasm of upper lobe, unspecified bronchus or lung: Secondary | ICD-10-CM

## 2013-10-11 DIAGNOSIS — Z95828 Presence of other vascular implants and grafts: Secondary | ICD-10-CM

## 2013-10-11 MED ORDER — SODIUM CHLORIDE 0.9 % IJ SOLN
10.0000 mL | INTRAMUSCULAR | Status: DC | PRN
  Administered 2013-10-11: 10 mL via INTRAVENOUS
  Filled 2013-10-11: qty 10

## 2013-10-11 MED ORDER — HEPARIN SOD (PORK) LOCK FLUSH 100 UNIT/ML IV SOLN
500.0000 [IU] | Freq: Once | INTRAVENOUS | Status: AC
  Administered 2013-10-11: 500 [IU] via INTRAVENOUS
  Filled 2013-10-11: qty 5

## 2013-10-11 NOTE — Patient Instructions (Signed)

## 2013-10-12 ENCOUNTER — Ambulatory Visit (HOSPITAL_COMMUNITY): Payer: Medicare Other | Attending: Cardiology | Admitting: Radiology

## 2013-10-12 DIAGNOSIS — Z87891 Personal history of nicotine dependence: Secondary | ICD-10-CM | POA: Insufficient documentation

## 2013-10-12 DIAGNOSIS — I719 Aortic aneurysm of unspecified site, without rupture: Secondary | ICD-10-CM | POA: Insufficient documentation

## 2013-10-12 DIAGNOSIS — R0609 Other forms of dyspnea: Secondary | ICD-10-CM | POA: Insufficient documentation

## 2013-10-12 DIAGNOSIS — I2510 Atherosclerotic heart disease of native coronary artery without angina pectoris: Secondary | ICD-10-CM | POA: Insufficient documentation

## 2013-10-12 DIAGNOSIS — J4489 Other specified chronic obstructive pulmonary disease: Secondary | ICD-10-CM | POA: Insufficient documentation

## 2013-10-12 DIAGNOSIS — C3490 Malignant neoplasm of unspecified part of unspecified bronchus or lung: Secondary | ICD-10-CM | POA: Insufficient documentation

## 2013-10-12 DIAGNOSIS — E785 Hyperlipidemia, unspecified: Secondary | ICD-10-CM | POA: Insufficient documentation

## 2013-10-12 DIAGNOSIS — R0989 Other specified symptoms and signs involving the circulatory and respiratory systems: Secondary | ICD-10-CM | POA: Insufficient documentation

## 2013-10-12 DIAGNOSIS — Z01810 Encounter for preprocedural cardiovascular examination: Secondary | ICD-10-CM | POA: Insufficient documentation

## 2013-10-12 DIAGNOSIS — J449 Chronic obstructive pulmonary disease, unspecified: Secondary | ICD-10-CM | POA: Insufficient documentation

## 2013-10-12 NOTE — Progress Notes (Signed)
Echocardiogram performed.  

## 2013-10-13 ENCOUNTER — Encounter (HOSPITAL_COMMUNITY): Payer: Self-pay | Admitting: Pharmacy Technician

## 2013-10-19 ENCOUNTER — Other Ambulatory Visit: Payer: Self-pay

## 2013-10-19 MED ORDER — FUROSEMIDE 20 MG PO TABS: 20.0000 mg | ORAL_TABLET | Freq: Every day | ORAL | Status: DC

## 2013-10-20 ENCOUNTER — Encounter (HOSPITAL_COMMUNITY)
Admission: RE | Disposition: A | Discharge status: Home / Self Care | Payer: Self-pay | Source: Ambulatory Visit | Attending: Vascular Surgery

## 2013-10-20 ENCOUNTER — Ambulatory Visit (HOSPITAL_COMMUNITY)
Admission: RE | Admit: 2013-10-20 | Discharge: 2013-10-21 | Disposition: A | Discharge status: Home / Self Care | Payer: Medicare Other | Source: Ambulatory Visit | Attending: Vascular Surgery | Admitting: Vascular Surgery

## 2013-10-20 DIAGNOSIS — Z87891 Personal history of nicotine dependence: Secondary | ICD-10-CM | POA: Insufficient documentation

## 2013-10-20 DIAGNOSIS — M1710 Unilateral primary osteoarthritis, unspecified knee: Secondary | ICD-10-CM | POA: Insufficient documentation

## 2013-10-20 DIAGNOSIS — I723 Aneurysm of iliac artery: Secondary | ICD-10-CM | POA: Insufficient documentation

## 2013-10-20 DIAGNOSIS — Z9221 Personal history of antineoplastic chemotherapy: Secondary | ICD-10-CM | POA: Insufficient documentation

## 2013-10-20 DIAGNOSIS — L719 Rosacea, unspecified: Secondary | ICD-10-CM | POA: Insufficient documentation

## 2013-10-20 DIAGNOSIS — H269 Unspecified cataract: Secondary | ICD-10-CM | POA: Insufficient documentation

## 2013-10-20 DIAGNOSIS — I714 Abdominal aortic aneurysm, without rupture, unspecified: Secondary | ICD-10-CM

## 2013-10-20 DIAGNOSIS — M479 Spondylosis, unspecified: Secondary | ICD-10-CM | POA: Insufficient documentation

## 2013-10-20 DIAGNOSIS — Z860100 Personal history of colon polyps, unspecified: Secondary | ICD-10-CM | POA: Insufficient documentation

## 2013-10-20 DIAGNOSIS — E039 Hypothyroidism, unspecified: Secondary | ICD-10-CM | POA: Insufficient documentation

## 2013-10-20 DIAGNOSIS — N400 Enlarged prostate without lower urinary tract symptoms: Secondary | ICD-10-CM | POA: Insufficient documentation

## 2013-10-20 DIAGNOSIS — K5730 Diverticulosis of large intestine without perforation or abscess without bleeding: Secondary | ICD-10-CM | POA: Insufficient documentation

## 2013-10-20 DIAGNOSIS — K7460 Unspecified cirrhosis of liver: Secondary | ICD-10-CM | POA: Insufficient documentation

## 2013-10-20 DIAGNOSIS — IMO0002 Reserved for concepts with insufficient information to code with codable children: Secondary | ICD-10-CM

## 2013-10-20 DIAGNOSIS — J438 Other emphysema: Secondary | ICD-10-CM | POA: Insufficient documentation

## 2013-10-20 DIAGNOSIS — Z5309 Procedure and treatment not carried out because of other contraindication: Secondary | ICD-10-CM | POA: Insufficient documentation

## 2013-10-20 DIAGNOSIS — Z85118 Personal history of other malignant neoplasm of bronchus and lung: Secondary | ICD-10-CM | POA: Insufficient documentation

## 2013-10-20 DIAGNOSIS — E785 Hyperlipidemia, unspecified: Secondary | ICD-10-CM | POA: Insufficient documentation

## 2013-10-20 DIAGNOSIS — I771 Stricture of artery: Secondary | ICD-10-CM | POA: Insufficient documentation

## 2013-10-20 DIAGNOSIS — Z923 Personal history of irradiation: Secondary | ICD-10-CM | POA: Insufficient documentation

## 2013-10-20 DIAGNOSIS — I2510 Atherosclerotic heart disease of native coronary artery without angina pectoris: Secondary | ICD-10-CM | POA: Insufficient documentation

## 2013-10-20 HISTORY — PX: LOWER EXTREMITY ANGIOGRAM: SHX5508

## 2013-10-20 HISTORY — PX: ABDOMINAL AORTAGRAM: SHX5454

## 2013-10-20 LAB — POCT I-STAT, CHEM 8
BUN: 11 mg/dL (ref 6–23)
CHLORIDE: 104 meq/L (ref 96–112)
Calcium, Ion: 1.25 mmol/L (ref 1.13–1.30)
Creatinine, Ser: 0.8 mg/dL (ref 0.50–1.35)
Glucose, Bld: 99 mg/dL (ref 70–99)
HEMATOCRIT: 46 % (ref 39.0–52.0)
Hemoglobin: 15.6 g/dL (ref 13.0–17.0)
Potassium: 4.1 mEq/L (ref 3.7–5.3)
SODIUM: 142 meq/L (ref 137–147)
TCO2: 24 mmol/L (ref 0–100)

## 2013-10-20 SURGERY — ABDOMINAL AORTAGRAM
Laterality: Bilateral

## 2013-10-20 MED ORDER — FUROSEMIDE 20 MG PO TABS: 20.0000 mg | ORAL_TABLET | Freq: Every day | ORAL | Status: DC

## 2013-10-20 MED ORDER — HYDRALAZINE HCL 20 MG/ML IJ SOLN: 10.0000 mg | INTRAMUSCULAR | Status: DC | PRN

## 2013-10-20 MED ORDER — PHENOL 1.4 % MT LIQD: 1.0000 | OROMUCOSAL | Status: DC | PRN

## 2013-10-20 MED ORDER — TIOTROPIUM BROMIDE MONOHYDRATE 18 MCG IN CAPS
18.0000 ug | ORAL_CAPSULE | Freq: Every day | RESPIRATORY_TRACT | Status: DC
  Administered 2013-10-21: 18 ug via RESPIRATORY_TRACT
  Filled 2013-10-20: qty 5

## 2013-10-20 MED ORDER — LEVOTHYROXINE SODIUM 175 MCG PO TABS
175.0000 ug | ORAL_TABLET | Freq: Every day | ORAL | Status: DC
Start: 2013-10-21 — End: 2013-10-21
  Administered 2013-10-21: 08:00:00 175 ug via ORAL
  Filled 2013-10-20 (×2): qty 1

## 2013-10-20 MED ORDER — TAMSULOSIN HCL 0.4 MG PO CAPS
0.4000 mg | ORAL_CAPSULE | Freq: Every day | ORAL | Status: DC
  Administered 2013-10-20: 0.4 mg via ORAL
  Filled 2013-10-20 (×2): qty 1

## 2013-10-20 MED ORDER — ACETAMINOPHEN 650 MG RE SUPP: 325.0000 mg | RECTAL | Status: DC | PRN

## 2013-10-20 MED ORDER — METOPROLOL TARTRATE 1 MG/ML IV SOLN: 2.0000 mg | INTRAVENOUS | Status: DC | PRN

## 2013-10-20 MED ORDER — ATORVASTATIN CALCIUM 10 MG PO TABS
10.0000 mg | ORAL_TABLET | Freq: Every day | ORAL | Status: DC
  Administered 2013-10-20: 21:00:00 10 mg via ORAL
  Filled 2013-10-20 (×2): qty 1

## 2013-10-20 MED ORDER — LABETALOL HCL 5 MG/ML IV SOLN: 10.0000 mg | INTRAVENOUS | Status: DC | PRN

## 2013-10-20 MED ORDER — ACETAMINOPHEN 325 MG PO TABS: 325.0000 mg | ORAL_TABLET | ORAL | Status: DC | PRN

## 2013-10-20 MED ORDER — ONDANSETRON HCL 4 MG/2ML IJ SOLN: 4.0000 mg | Freq: Four times a day (QID) | INTRAMUSCULAR | Status: DC | PRN

## 2013-10-20 MED ORDER — GUAIFENESIN-DM 100-10 MG/5ML PO SYRP: 15.0000 mL | ORAL_SOLUTION | ORAL | Status: DC | PRN

## 2013-10-20 MED ORDER — SODIUM CHLORIDE 0.9 % IV SOLN
INTRAVENOUS | Status: DC
Start: 2013-10-20 — End: 2013-10-20
  Administered 2013-10-20: 09:00:00 via INTRAVENOUS

## 2013-10-20 MED ORDER — FENTANYL CITRATE 0.05 MG/ML IJ SOLN
INTRAMUSCULAR | Status: AC
  Filled 2013-10-20: qty 2

## 2013-10-20 MED ORDER — ALUM & MAG HYDROXIDE-SIMETH 200-200-20 MG/5ML PO SUSP: 15.0000 mL | ORAL | Status: DC | PRN

## 2013-10-20 MED ORDER — OXYCODONE-ACETAMINOPHEN 5-325 MG PO TABS
1.0000 | ORAL_TABLET | ORAL | Status: DC | PRN
  Administered 2013-10-20: 22:00:00 2 via ORAL
  Filled 2013-10-20: qty 2

## 2013-10-20 MED ORDER — MORPHINE SULFATE 2 MG/ML IJ SOLN: 2.0000 mg | INTRAMUSCULAR | Status: DC | PRN

## 2013-10-20 MED ORDER — TIOTROPIUM BROMIDE MONOHYDRATE 18 MCG IN CAPS
18.0000 ug | ORAL_CAPSULE | Freq: Every day | RESPIRATORY_TRACT | Status: DC
  Filled 2013-10-20: qty 5

## 2013-10-20 MED ORDER — LIDOCAINE HCL (PF) 1 % IJ SOLN
INTRAMUSCULAR | Status: AC
  Filled 2013-10-20: qty 30

## 2013-10-20 MED ORDER — SODIUM CHLORIDE 0.45 % IV SOLN
INTRAVENOUS | Status: DC
  Administered 2013-10-20: 17:00:00 via INTRAVENOUS

## 2013-10-20 MED ORDER — HEPARIN (PORCINE) IN NACL 2-0.9 UNIT/ML-% IJ SOLN
INTRAMUSCULAR | Status: AC
  Filled 2013-10-20: qty 1000

## 2013-10-20 NOTE — Interval H&P Note (Signed)
History and Physical Interval Note:  10/20/2013 8:47 AM  Joseph [name]  has presented today for surgery, with the diagnosis of PVD  The various methods of treatment have been discussed with the patient and family. After consideration of risks, benefits and other options for treatment, the patient has consented to  Procedure(s): EMBOLIZATION (N/A) as a surgical intervention .  The patient's history has been reviewed, patient examined, no change in status, stable for surgery.  I have reviewed the patient's chart and labs.  Questions were answered to the patient's satisfaction.     FIELDS,CHARLES E

## 2013-10-20 NOTE — H&P (View-Only) (Signed)
[facility] [place] HISTORY AND PHYSICAL    History of Present Illness:  Patient is a 73 y.o. year old male who presents for evaluation of abdominal aortic aneurysm.  Patient returns today after recent CT scan of the abdomen and pelvis did discuss an intervention The patient denies abdominal pain.  The patient denies back pain.  The patient has family history of AAA in his brother.  Other medical problems include stage 3 lung cancer dormant status post chemo radiation. 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 He denies significant cardiac history. He is also being considered for left knee replacement by Dr. Durward Fortes. Other chronic medical problems include hyperlipidemia, COPD, possible cirrhosis by CT scan. All these are currently stable.    Past Medical History   Diagnosis  Date   .  History of colon polyps  2006,2008   .  Abdominal aneurysm         4.4cm   .  Hyperlipidemia         takes Pravastatin   .  Emphysema     .  Lung cancer         right upper lobe   .  Enlarged prostate         takes Rapaflo daily   .  Arthritis         left knee and back arthrtis   .  Hypothyroidism         takes Synthroid daily   .  Cataract     .  Hx of radiation therapy  09/02/11 to 10/19/11       chest   .  Diverticulosis     .  Cirrhosis         By radiography, seen on CT   .  Rosacea         Past Surgical History    Procedure   Date    .   Hernia repair   70's    .   Vasectomy   70's    .   Finger surgery   2008          left pointer finger    .   Knee arthroscopy   2008          left knee    .   Tonsillectomy             as a child    .   Colonoscopy   2008    .   Cardiac catheterization   2003    .   Esophagogastroduodenoscopy       .   Portacath placement   08/10/2011          Procedure: INSERTION PORT-A-CATH;  Surgeon: Pierre Bali, MD;  Location: [facility] OR;  Service: Thoracic;  Laterality: Left;  Left Subclavian        Social History History    Substance Use Topics    .   Smoking status:   Former Smoker -- 1.5 packs/day for 40 years          Types:   Cigarettes          Quit date:   07/02/2011    .   Smokeless tobacco:   Former Systems developer    .   Alcohol Use:   Yes             social      Family  History Family History    Problem   Relation   Age of Onset    .   Anesthesia problems   Neg Hx       .   Hypotension   Neg Hx       .   Malignant hyperthermia   Neg Hx       .   Pseudochol deficiency   Neg Hx       .   Cancer   Father       .   Diabetes   Father       .   Hyperlipidemia   Brother       .   Aneurysm   Brother             Abdominal aortic aneurysm      Allergies  No Known Allergies     Current Outpatient Prescriptions on File Prior to Visit   Medication  Sig  Dispense  Refill   .  Misc Natural Products (PROSTATE HEALTH) CAPS  Take by mouth.           .  Multiple Vitamins-Minerals (MULTIVITAMINS THER. W/MINERALS) TABS  Take 1 tablet by mouth daily.           Marland Kitchen  SYNTHROID 175 MCG tablet  Take 175 mcg by mouth daily.          .  tamsulosin (FLOMAX) 0.4 MG CAPS  Take 0.4 mg by mouth daily.         Marland Kitchen  tiotropium (SPIRIVA) 18 MCG inhalation capsule  Place 18 mcg into inhaler and inhale daily.         Marland Kitchen  FINACEA 15 % cream  Apply 1 application topically as directed.         Marland Kitchen  HYDROcodone-acetaminophen (NORCO) 5-325 MG per tablet  Take 1 tablet by mouth every 6 (six) hours as needed for pain.   30 tablet   0   .  lidocaine-prilocaine (EMLA) cream  Apply 1 application topically as needed.           .  nystatin-triamcinolone (MYCOLOG II) cream  Apply 1 application topically daily as needed.             No current facility-administered medications on file prior to visit.     ROS:    General:  No weight loss, Fever, chills, has general fatigue  HEENT: No recent headaches, no nasal bleeding, no visual changes, no sore throat  Neurologic: No dizziness, blackouts, seizures. No recent  symptoms of stroke or mini- stroke. No recent episodes of slurred speech, or temporary blindness.  Cardiac: No recent episodes of chest pain/pressure, no shortness of breath at rest.  Has shortness of breath with exertion.  Denies history of atrial fibrillation or irregular heartbeat  Vascular: No history of rest pain in feet.  No history of claudication.  No history of non-healing ulcer, No history of DVT    Pulmonary: No home oxygen, chronic cough, no hemoptysis,  No asthma or wheezing  Musculoskeletal:  [x ] Arthritis, [x ] Low back pain,  [x ] Joint pain  Hematologic:No history of hypercoagulable state.  No history of easy bleeding.  No history of anemia  Gastrointestinal: No hematochezia or melena,  No gastroesophageal reflux, no trouble swallowing  Urinary: [ ]  chronic Kidney disease, [ ]  on HD - [ ]  MWF or [ ]  TTHS, [ ]  Burning with urination, [ ]  Frequent urination, [ ]  Difficulty urinating;  Skin: No rashes  Psychological: No history of anxiety,  No history of depression   Physical Examination       Filed Vitals:   09/21/13 1056  BP: 118/64  Pulse: 98  Height: 6' (1.829 m)  Weight: 217 lb 9.6 oz (98.703 kg)  SpO2: 97%   General:  Alert and oriented, no acute distress HEENT: Normal Neck: No bruit or JVD Pulmonary: Clear to auscultation bilaterally Cardiac: Regular Rate and Rhythm without murmur Gastrointestinal: Soft, non-tender, non-distended, vaguely palpable pulsatile epigastric mass Skin: No rash Extremity Pulses:  2+ radial, brachial, femoral, dorsalis pedis, posterior tibial pulses bilaterally Musculoskeletal: No deformity or edema     Neurologic: Upper and lower extremity motor 5/5 and symmetric  Data: CT scan of abdomen and pelvis is reviewed today.  Infrarenal abdominal aortic aneurysm has enlarged since 2013.  Aneurysm now measures up to 6.2 cm. Aneurysm extends into the common iliac arteries bilaterally. Enlargement of the common iliac arteries   bilaterally. Left common iliac artery aneurysm measures up to 5.1 cm and the right measures 3.1 cm. 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   ASSESSMENT: Patient with large abdominal and iliac artery aneurysms. Stage III lung cancer currently dormant. I discussed with the patient today the procedural details of aneurysm stent graft during or possible open aneurysm repair. With a stent graft, this would require bilateral coil embolization of his internal iliac arteries. There is some low but present risk of colon infarction as well as buttock claudication sexual dysfunction with this. I also discussed with the patient that open aneurysm repair would be a much larger operation with risks of transfusion, and high risk of pulmonary events. They're also be longer recovery time. Risk of aneurysm rupture as well as signs and symptoms were discussed with the patient today.  at this point the patient wishes to proceed aneurysm stent graft repair. He will be scheduled for coil embolization of the internal iliac arteries and staged fashion. This will be followed by aneurysm stent graft repair. He will also need preoperative cardiac risk stratification. All these will be set up in the next few weeks.   Ruta Hinds, MD [facility] [place] Office: [phone] Pager: [phone]

## 2013-10-20 NOTE — Op Note (Signed)
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 The patient was placed in supine position the Angio table. Both groins were prepped and draped in usual sterile fashion. Ultrasound was used to identify the right common femoral artery. An introducer needle was used to cannulate the right common femoral artery without difficulty. An 74 Bentson wire was advanced up in the abdominal aorta. A 5 French sheath was placed over the guidewire into the right common femoral artery. A 5 French pigtail catheter was placed over the guidewire into the abdominal aorta. An abdominal aortogram was obtained. The left and right renal arteries are patent. The left and right common iliac arteries are aneurysmal and patent. Next the pigtail catheter was pulled down just above the aortic bifurcation oblique views of the pelvis were obtained. The left external iliac artery is very tortuous. It is patent. The left internal iliac artery is patent and aneurysmal near its origin. This is a wide mouth aneurysm with minimal outflow branches. In the right side the right external iliac artery is also tortuous but not as bad as on the left.  The right internal iliac artery is also aneurysmal with a widemouth origin there are also minimal outflow branches. At this point an attempt was made to coil embolize the left internal iliac artery. A 5 French crossover catheter was used to selectively catheterize the left common iliac artery followed by the left internal iliac artery. Several catheters including a crossover  catheter and a C1 catheter were used in an attempt to try to get reasonable purchase in the left internal iliac to place coils. However, due to to overall tortuosity of the iliacs and the wide-based short mouth internal iliac artery aneurysm I was unable to get reasonable purchase in this. At this point I decided to try to come from the ipsilateral side. Ultrasound was used to identify the left common femoral artery. An introducer unit was used to cannulate the left common femoral artery and an 035 first core wire threaded up into the abdominal aorta under fluoroscopic guidance. This next attempts were made to cannulate the ipsilateral left internal iliac artery using a 5 French crossover catheter as well as the C1 catheter and again although I was able to engage the aneurysm I was unable to get distal purchase enough to place coils safely. At this point attempts to coil embolize the left side were abandoned. An attempt was then made to try to coil embolize the right side. Again using a crossover catheter the right common iliac artery was selectively catheterized and 035 angled Glidewire was advanced down into the right distal common iliac artery. However, due to the tortuosity of the left iliac system I was unable to get the catheter to advance significantly down in the right common iliac artery system to cannulate the right internal iliac artery. Attention was then turned back to the ipsilateral right iliac system. A 5 [place] Berenstein one 90 angle catheter was brought in the operative field and this  was advanced to the iliac bifurcation. I was able to get this catheter to engage into the origin of the internal iliac artery several times and advanced the guidewire into it. However, again due to the lack of the segmental branches in the widemouth of the aneurysm I was unable to get enough purchase of catheter into this to safely place coils. At this point the patient had had significant fluoroscopy time and we  were not making significant progress so the procedure was aborted. The catheters and guidewires were removed and the 5 French sheaths left in place to be pulled in the holding area.  Management: The patient will be brought back to the [facility] lab in the near future for an additional attempt at coil embolization. If we are unsuccessful he would need open aneurysm repair.  Ruta Hinds, MD [facility] [place] Office: [phone] Pager: [phone]

## 2013-10-21 ENCOUNTER — Encounter (HOSPITAL_COMMUNITY): Payer: Self-pay

## 2013-10-21 LAB — BASIC METABOLIC PANEL
BUN: 16 mg/dL (ref 6–23)
CO2: 23 mEq/L (ref 19–32)
Calcium: 8.7 mg/dL (ref 8.4–10.5)
Chloride: 104 mEq/L (ref 96–112)
Creatinine, Ser: 0.83 mg/dL (ref 0.50–1.35)
GFR calc Af Amer: >90 mL/min (ref >90)
GFR, EST NON AFRICAN AMERICAN: 87 mL/min — AB (ref >90)
Glucose, Bld: 105 mg/dL — ABNORMAL HIGH (ref 70–99)
POTASSIUM: 4.1 meq/L (ref 3.7–5.3)
SODIUM: 138 meq/L (ref 137–147)

## 2013-10-21 LAB — CBC
HCT: 41.5 % (ref 39.0–52.0)
HEMOGLOBIN: 14.4 g/dL (ref 13.0–17.0)
MCH: 34.2 pg — ABNORMAL HIGH (ref 26.0–34.0)
MCHC: 34.7 g/dL (ref 30.0–36.0)
MCV: 98.6 fL (ref 78.0–100.0)
Platelets: 126 10*3/uL — ABNORMAL LOW (ref 150–400)
RBC: 4.21 MIL/uL — AB (ref 4.22–5.81)
RDW: 13.7 % (ref 11.5–15.5)
WBC: 5.8 10*3/uL (ref 4.0–10.5)

## 2013-10-21 NOTE — Discharge Summary (Signed)
[facility] Discharge Summary  ORMOND LAZO [birth year] 73 y.o. male  [id number]  Admission Date: 10/20/2013  Discharge Date: 10/21/13  Physician: Elam Dutch, MD  Admission Diagnosis: PVD   HPI:   This is a 73 y.o. male who presents for evaluation of abdominal aortic aneurysm. Patient returns today after recent CT scan of the abdomen and pelvis did discuss an intervention The patient denies abdominal pain. The patient denies back pain. The patient has family history of AAA in his brother. Other medical problems include stage 3 lung cancer dormant status post chemo radiation. 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 He denies significant cardiac history. He is also being considered for left knee replacement by Dr. Durward Fortes. Other chronic medical problems include hyperlipidemia, COPD, possible cirrhosis by CT scan. All these are currently stable.  Hospital Course:  The patient was admitted to the hospital and taken to the [facility] lab on 10/20/2013 and underwent: Abdominal aortogram attempted left and right internal iliac coil embolization.  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    The pt tolerated the procedure well and was transported to the PACU in good condition.  A discussion was held with pt and wife on how to proceed with re-attempt at embolization or open repair next week.  Dr. Oneida Alar will call pt Sunday evening to discuss.  The remainder of the hospital course consisted of increasing mobilization and increasing intake of solids without difficulty.  CBC    Component Value Date/Time   WBC 5.8 10/21/2013 0430   WBC 5.1 07/17/2013 0856   RBC 4.21* 10/21/2013 0430   RBC 4.35 07/17/2013 0856   HGB 14.4 10/21/2013 0430   HGB 14.6 07/17/2013 0856   HCT 41.5 10/21/2013 0430   HCT 43.2 07/17/2013 0856   PLT 126* 10/21/2013 0430   PLT 134*  07/17/2013 0856   MCV 98.6 10/21/2013 0430   MCV 99.2* 07/17/2013 0856   MCH 34.2* 10/21/2013 0430   MCH 33.6* 07/17/2013 0856   MCHC 34.7 10/21/2013 0430   MCHC 33.8 07/17/2013 0856   RDW 13.7 10/21/2013 0430   RDW 14.0 07/17/2013 0856   LYMPHSABS 1.2 07/17/2013 0856   MONOABS 0.5 07/17/2013 0856   EOSABS 0.1 07/17/2013 0856   BASOSABS 0.0 07/17/2013 0856    BMET    Component Value Date/Time   NA 138 10/21/2013 0430   NA 141 07/17/2013 0856   K 4.1 10/21/2013 0430   K 4.2 07/17/2013 0856   CL 104 10/21/2013 0430   CL 109* 08/01/2012 1046   CO2 23 10/21/2013 0430   CO2 23 07/17/2013 0856   GLUCOSE 105* 10/21/2013 0430   GLUCOSE 94 07/17/2013 0856   GLUCOSE 93 08/01/2012 1046   BUN 16 10/21/2013 0430   BUN 14.7 07/17/2013 0856   CREATININE 0.83 10/21/2013 0430   CREATININE 1.00 08/17/2013 1250   CREATININE 0.9 07/17/2013 0856   CALCIUM 8.7 10/21/2013 0430   CALCIUM 8.9 07/17/2013 0856   GFRNONAA 87* 10/21/2013 0430   GFRAA >90 10/21/2013 0430     Discharge Instructions:   The patient is discharged to home with extensive instructions on wound care and progressive ambulation.  They are instructed not to drive or perform any heavy lifting until returning to see the physician in his office.  Discharge Orders   Future Appointments Provider Department Dept Phone   11/09/2013  11:30 AM [place] Office [phone]   11/22/2013 11:30 AM [facility] [phone]   01/01/2014 11:00 AM [facility] [phone]   01/01/2014 12:00 PM [facility] [phone]   Liquids only 4 hours prior to your exam. Any medications can be taken as usual. Please arrive 15 min prior to your scheduled exam time.   01/03/2014 11:15 AM Curt Bears, MD [facility] [phone]   01/03/2014 11:45 AM [facility] [phone]   01/16/2014 4:00 PM Dorothy Spark, MD [place] [facility] [phone]   Future Orders Complete By Expires   Call MD for:  redness, tenderness, or signs of infection (pain, swelling, bleeding, redness, odor or green/yellow discharge around incision site)  As directed    Call MD for:  severe or increased pain, loss or decreased feeling  in affected limb(s)  As directed    Call MD for:  temperature >100.5  As directed    Discharge wound care:  As directed    Comments:     Shower daily with soap and water starting 10/21/13   Driving Restrictions  As directed    Comments:     No driving for 24 hours   Lifting restrictions  As directed    Comments:     No lifting for 48 hours   Resume previous diet  As directed       Discharge Diagnosis:  PVD  Secondary Diagnosis: Patient Active Problem List   Diagnosis Date Noted  . Abdominal aortic aneurysm 10/20/2013  . Hyperlipidemia 10/09/2013  . Coronary artery disease 09/29/2013  . 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 Personal history of adenomatous colonic polyps 07/04/2012  . Hx of radiation therapy   . Abdominal aneurysm without mention of rupture 08/20/2011  . Malignant neoplasm of upper lobe, bronchus or lung 08/06/2011   Past Medical History  Diagnosis Date  . History of colon polyps 2006,2008  . 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 Hyperlipidemia     takes Pravastatin  . Emphysema   . Lung cancer     right upper lobe  . Enlarged prostate     takes Rapaflo daily  . Arthritis     left knee and back arthrtis  . Hypothyroidism     takes Synthroid daily  . Cataract   . Hx of radiation therapy 09/02/11 to 10/19/11    chest  . Diverticulosis   . Cirrhosis     By radiography, seen on CT  . Rosacea   . CAD (coronary artery disease)   . Shortness of breath        Medication List         atorvastatin 10 MG tablet  Commonly known  as:  LIPITOR  Take 1 tablet (10 mg total) by mouth daily.     doxycycline 100 MG capsule  Commonly known as:  VIBRAMYCIN  Take 100 mg by mouth 2 (two) times daily.     FINACEA 15 % cream  Generic drug:  Azelaic Acid  Apply 1 application topically as directed.     furosemide 20 MG tablet  Commonly known as:  LASIX  Take 1 tablet (20 mg total) by mouth daily.     HYDROcodone-acetaminophen 5-325 MG per tablet  Commonly  known as:  NORCO/VICODIN  Take 1 tablet by mouth every 6 (six) hours as needed for pain.     lidocaine-prilocaine cream  Commonly known as:  EMLA  Apply 1 application topically as needed.     metroNIDAZOLE 0.75 % gel  Commonly known as:  METROGEL  Apply 1 application topically 2 (two) times daily.     MILK THISTLE PO  Take 1 capsule by mouth daily. Take one 1200 mg capsule daily     multivitamins ther. w/minerals Tabs tablet  Take 1 tablet by mouth daily.     nystatin-triamcinolone cream  Commonly known as:  MYCOLOG II  Apply 1 application topically daily as needed. Rash     OVER THE COUNTER MEDICATION  30 mg 3 (three) times daily. LUNG SUPPORT     OVER THE COUNTER MEDICATION  [place]  TAIL MUSHROOM  Takes 1 gr daily     SYNTHROID 175 MCG tablet  Generic drug:  levothyroxine  Take 175 mcg by mouth daily.     tamsulosin 0.4 MG Caps capsule  Commonly known as:  FLOMAX  Take 0.4 mg by mouth daily.     tiotropium 18 MCG inhalation capsule  Commonly known as:  SPIRIVA  Place 18 mcg into inhaler and inhale daily.        No pain Rx given  Disposition: home  Patient's condition: is Good  Follow up: 1. Dr. Oneida Alar will call pt to establish plan on Sunday evening   Leontine Locket, PA-C [facility] [phone] 10/21/2013  9:38 AM

## 2013-10-21 NOTE — Progress Notes (Signed)
No hematoma Feels ok Will d/c home Will determine whether we reattempt embolization or open repair next week  Joseph Hinds, MD [facility] [place] Office: [phone] Pager: [phone]   CBC    Component Value Date/Time   WBC 5.8 10/21/2013 0430   WBC 5.1 07/17/2013 0856   RBC 4.21* 10/21/2013 0430   RBC 4.35 07/17/2013 0856   HGB 14.4 10/21/2013 0430   HGB 14.6 07/17/2013 0856   HCT 41.5 10/21/2013 0430   HCT 43.2 07/17/2013 0856   PLT 126* 10/21/2013 0430   PLT 134* 07/17/2013 0856   MCV 98.6 10/21/2013 0430   MCV 99.2* 07/17/2013 0856   MCH 34.2* 10/21/2013 0430   MCH 33.6* 07/17/2013 0856   MCHC 34.7 10/21/2013 0430   MCHC 33.8 07/17/2013 0856   RDW 13.7 10/21/2013 0430   RDW 14.0 07/17/2013 0856   LYMPHSABS 1.2 07/17/2013 0856   MONOABS 0.5 07/17/2013 0856   EOSABS 0.1 07/17/2013 0856   BASOSABS 0.0 07/17/2013 0856     BMET    Component Value Date/Time   NA 138 10/21/2013 0430   NA 141 07/17/2013 0856   K 4.1 10/21/2013 0430   K 4.2 07/17/2013 0856   CL 104 10/21/2013 0430   CL 109* 08/01/2012 1046   CO2 23 10/21/2013 0430   CO2 23 07/17/2013 0856   GLUCOSE 105* 10/21/2013 0430   GLUCOSE 94 07/17/2013 0856   GLUCOSE 93 08/01/2012 1046   BUN 16 10/21/2013 0430   BUN 14.7 07/17/2013 0856   CREATININE 0.83 10/21/2013 0430   CREATININE 1.00 08/17/2013 1250   CREATININE 0.9 07/17/2013 0856   CALCIUM 8.7 10/21/2013 0430   CALCIUM 8.9 07/17/2013 0856   GFRNONAA 87* 10/21/2013 0430   GFRAA >90 10/21/2013 0430

## 2013-10-24 ENCOUNTER — Encounter: Payer: Self-pay | Admitting: Vascular Surgery

## 2013-10-25 ENCOUNTER — Other Ambulatory Visit: Payer: Self-pay | Admitting: *Deleted

## 2013-10-25 ENCOUNTER — Encounter: Payer: Self-pay | Admitting: Vascular Surgery

## 2013-10-25 ENCOUNTER — Telehealth: Payer: Self-pay | Admitting: Cardiology

## 2013-10-25 NOTE — Telephone Encounter (Signed)
New message     For Joseph [name] On lipitor----PCP will not be able to check cholesterol.  Should he stop lipitor until he can get lab work?  Pt is having AAA on 10-30-13.

## 2013-10-25 NOTE — Telephone Encounter (Signed)
Patient not sure when he will be able to get blood work for his LFTs given he will likely be in hospital for 1 week following AAA surgery on 10/30/13.  He is fearful of continuing lipitor without being able to monitor his LFTs given previous elevation of LFTs.  He would like to stop lipitor for now, and restart it after he gets home from surgery in ~ 10 days from now.  He will call back once he gets home and/or once he has an appointment set up with his PCP who typically checks his blood work, including liver function.  Will plan to recheck LFTs and lipid panel after he has been back on lipitor 10 mg for 4 weeks, and see me a few days later to review.  Will set this all up once he calls me back in March.

## 2013-10-30 ENCOUNTER — Encounter (HOSPITAL_COMMUNITY): Payer: Self-pay

## 2013-10-30 ENCOUNTER — Encounter (HOSPITAL_COMMUNITY)
Admission: RE | Admit: 2013-10-30 | Discharge: 2013-10-30 | Disposition: A | Discharge status: Home / Self Care | Payer: Medicare Other | Source: Ambulatory Visit | Attending: Vascular Surgery | Admitting: Vascular Surgery

## 2013-10-30 ENCOUNTER — Encounter (HOSPITAL_COMMUNITY)
Admission: RE | Admit: 2013-10-30 | Discharge: 2013-10-30 | Disposition: A | Discharge status: Home / Self Care | Payer: Medicare Other | Source: Ambulatory Visit | Attending: Anesthesiology | Admitting: Anesthesiology

## 2013-10-30 ENCOUNTER — Other Ambulatory Visit (HOSPITAL_COMMUNITY): Payer: Self-pay | Admitting: *Deleted

## 2013-10-30 DIAGNOSIS — Z01818 Encounter for other preprocedural examination: Secondary | ICD-10-CM

## 2013-10-30 DIAGNOSIS — Z01810 Encounter for preprocedural cardiovascular examination: Secondary | ICD-10-CM

## 2013-10-30 DIAGNOSIS — Z01812 Encounter for preprocedural laboratory examination: Secondary | ICD-10-CM

## 2013-10-30 HISTORY — DX: Cardiac murmur, unspecified: R01.1

## 2013-10-30 LAB — URINALYSIS, ROUTINE W REFLEX MICROSCOPIC
Glucose, UA: NEGATIVE mg/dL
Hgb urine dipstick: NEGATIVE
Ketones, ur: 15 mg/dL — AB
LEUKOCYTES UA: NEGATIVE
Nitrite: NEGATIVE
Protein, ur: NEGATIVE mg/dL
SPECIFIC GRAVITY, URINE: 1.028 (ref 1.005–1.030)
Urobilinogen, UA: 0.2 mg/dL (ref 0.0–1.0)
pH: 5 (ref 5.0–8.0)

## 2013-10-30 LAB — CBC
HCT: 44.1 % (ref 39.0–52.0)
HEMOGLOBIN: 15.3 g/dL (ref 13.0–17.0)
MCH: 33.7 pg (ref 26.0–34.0)
MCHC: 34.7 g/dL (ref 30.0–36.0)
MCV: 97.1 fL (ref 78.0–100.0)
Platelets: 137 10*3/uL — ABNORMAL LOW (ref 150–400)
RBC: 4.54 MIL/uL (ref 4.22–5.81)
RDW: 13.2 % (ref 11.5–15.5)
WBC: 6.1 10*3/uL (ref 4.0–10.5)

## 2013-10-30 LAB — COMPREHENSIVE METABOLIC PANEL
ALK PHOS: 154 U/L — AB (ref 39–117)
ALT: 29 U/L (ref 0–53)
AST: 34 U/L (ref 0–37)
Albumin: 3.4 g/dL — ABNORMAL LOW (ref 3.5–5.2)
BILIRUBIN TOTAL: 1 mg/dL (ref 0.3–1.2)
BUN: 13 mg/dL (ref 6–23)
CHLORIDE: 106 meq/L (ref 96–112)
CO2: 20 meq/L (ref 19–32)
CREATININE: 0.79 mg/dL (ref 0.50–1.35)
Calcium: 9.4 mg/dL (ref 8.4–10.5)
GFR, EST NON AFRICAN AMERICAN: 89 mL/min — AB (ref >90)
GLUCOSE: 90 mg/dL (ref 70–99)
POTASSIUM: 4.4 meq/L (ref 3.7–5.3)
Sodium: 141 mEq/L (ref 137–147)
Total Protein: 7.4 g/dL (ref 6.0–8.3)

## 2013-10-30 LAB — ABO/RH: ABO/RH(D): O POS

## 2013-10-30 LAB — SURGICAL PCR SCREEN
MRSA, PCR: NEGATIVE
STAPHYLOCOCCUS AUREUS: NEGATIVE

## 2013-10-30 LAB — PROTIME-INR
INR: 1.07 (ref 0.00–1.49)
PROTHROMBIN TIME: 13.7 s (ref 11.6–15.2)

## 2013-10-30 LAB — BLOOD GAS, ARTERIAL
Acid-Base Excess: 0.3 mmol/L (ref 0.0–2.0)
BICARBONATE: 23.3 meq/L (ref 20.0–24.0)
Drawn by: 344381
FIO2: 0.21 %
O2 Saturation: 97 %
PCO2 ART: 31 mmHg — AB (ref 35.0–45.0)
PH ART: 7.489 — AB (ref 7.350–7.450)
Patient temperature: 98.6
TCO2: 24.3 mmol/L (ref 0–100)
pO2, Arterial: 80.6 mmHg (ref 80.0–100.0)

## 2013-10-30 LAB — APTT: aPTT: 34 seconds (ref 24–37)

## 2013-10-30 MED ORDER — DEXTROSE 5 % IV SOLN
1.5000 g | INTRAVENOUS | Status: DC
  Filled 2013-10-30: qty 1.5

## 2013-10-30 MED ORDER — HEPARIN SODIUM (PORCINE) 1000 UNIT/ML IJ SOLN
INTRAMUSCULAR | Status: AC
  Filled 2013-10-30: qty 1

## 2013-10-30 MED ORDER — ONDANSETRON HCL 4 MG/2ML IJ SOLN
INTRAMUSCULAR | Status: AC
  Filled 2013-10-30: qty 2

## 2013-10-30 MED ORDER — LIDOCAINE HCL (CARDIAC) 20 MG/ML IV SOLN
INTRAVENOUS | Status: AC
  Filled 2013-10-30: qty 5

## 2013-10-30 MED ORDER — ROCURONIUM BROMIDE 50 MG/5ML IV SOLN
INTRAVENOUS | Status: AC
  Filled 2013-10-30: qty 2

## 2013-10-30 NOTE — Pre-Procedure Instructions (Addendum)
Joseph [name]  10/30/2013   Your procedure is scheduled on:  10/31/13  Report to [facility] cone short stay admitting at 15AM.  Call this number if you have problems the morning of surgery: [phone]   Remember:   Do not eat food or drink liquids after midnight.   Take these medicines the morning of surgery with A SIP OF WATER: pain med if needed,synthroid,flomax, spiriva            STOP all herbel meds, nsaids (aleve,naproxen,advil,ibuprofen) now including milk thistle, vitamins, and all over the counter meds   Do not wear jewelry, make-up or nail polish.  Do not wear lotions, powders, or perfumes. You may wear deodorant.  Do not shave 48 hours prior to surgery. Men may shave face and neck.  Do not bring valuables to the hospital.  [facility] is not responsible                  for any belongings or valuables.               Contacts, dentures or bridgework may not be worn into surgery.  Leave suitcase in the car. After surgery it may be brought to your room.  For patients admitted to the hospital, discharge time is determined by your                treatment team.               Patients discharged the day of surgery will not be allowed to drive  home.  Name and phone number of your driver:   Special Instructions:  Special Instructions: Joseph [name] - Preparing for Surgery  Before surgery, you can play an important role.  Because skin is not sterile, your skin needs to be as free of germs as possible.  You can reduce the number of germs on you skin by washing with CHG (chlorahexidine gluconate) soap before surgery.  CHG is an antiseptic cleaner which kills germs and bonds with the skin to continue killing germs even after washing.  Please DO NOT use if you have an allergy to CHG or antibacterial soaps.  If your skin becomes reddened/irritated stop using the CHG and inform your nurse when you arrive at Short Stay.  Do not shave (including legs and underarms) for at least 48 hours  prior to the first CHG shower.  You may shave your face.  Please follow these instructions carefully:   1.  Shower with CHG Soap the night before surgery and the morning of Surgery.  2.  If you choose to wash your hair, wash your hair first as usual with your normal shampoo.  3.  After you shampoo, rinse your hair and body thoroughly to remove the Shampoo.  4.  Use CHG as you would any other liquid soap.  You can apply chg directly  to the skin and wash gently with scrungie or a clean washcloth.  5.  Apply the CHG Soap to your body ONLY FROM THE NECK DOWN.  Do not use on open wounds or open sores.  Avoid contact with your eyes ears, mouth and genitals (private parts).  Wash genitals (private parts)       with your normal soap.  6.  Wash thoroughly, paying special attention to the area where your surgery will be performed.  7.  Thoroughly rinse your body with warm water from the neck down.  8.  DO NOT shower/wash with your normal soap  after using and rinsing off the CHG Soap.  9.  Pat yourself dry with a clean towel.            10.  Wear clean pajamas.            11.  Place clean sheets on your bed the night of your first shower and do not sleep with pets.  Day of Surgery  Do not apply any lotions/deodorants the morning of surgery.  Please wear clean clothes to the hospital/surgery center.   Please read over the following fact sheets that you were given: Pain Booklet, Coughing and Deep Breathing, Blood Transfusion Information, MRSA Information and Surgical Site Infection Prevention

## 2013-10-31 ENCOUNTER — Inpatient Hospital Stay (HOSPITAL_COMMUNITY): Payer: Medicare Other

## 2013-10-31 ENCOUNTER — Inpatient Hospital Stay (HOSPITAL_COMMUNITY)
Admission: RE | Admit: 2013-10-31 | Discharge: 2013-11-13 | DRG: 237 | Disposition: A | Discharge status: Home / Self Care | Payer: Medicare Other | Source: Ambulatory Visit | Attending: Vascular Surgery | Admitting: Vascular Surgery

## 2013-10-31 ENCOUNTER — Inpatient Hospital Stay (HOSPITAL_COMMUNITY): Payer: Medicare Other | Admitting: Anesthesiology

## 2013-10-31 ENCOUNTER — Encounter (HOSPITAL_COMMUNITY)
Admission: RE | Disposition: A | Discharge status: Home / Self Care | Payer: Medicare Other | Source: Ambulatory Visit | Attending: Vascular Surgery

## 2013-10-31 ENCOUNTER — Encounter (HOSPITAL_COMMUNITY): Payer: Medicare Other | Admitting: Anesthesiology

## 2013-10-31 ENCOUNTER — Encounter (HOSPITAL_COMMUNITY): Payer: Self-pay | Admitting: *Deleted

## 2013-10-31 DIAGNOSIS — E785 Hyperlipidemia, unspecified: Secondary | ICD-10-CM | POA: Diagnosis present

## 2013-10-31 DIAGNOSIS — Z923 Personal history of irradiation: Secondary | ICD-10-CM

## 2013-10-31 DIAGNOSIS — D500 Iron deficiency anemia secondary to blood loss (chronic): Secondary | ICD-10-CM | POA: Diagnosis not present

## 2013-10-31 DIAGNOSIS — Z9911 Dependence on respirator [ventilator] status: Secondary | ICD-10-CM

## 2013-10-31 DIAGNOSIS — K7290 Hepatic failure, unspecified without coma: Secondary | ICD-10-CM | POA: Diagnosis not present

## 2013-10-31 DIAGNOSIS — C3410 Malignant neoplasm of upper lobe, unspecified bronchus or lung: Secondary | ICD-10-CM

## 2013-10-31 DIAGNOSIS — J438 Other emphysema: Secondary | ICD-10-CM | POA: Diagnosis present

## 2013-10-31 DIAGNOSIS — I723 Aneurysm of iliac artery: Secondary | ICD-10-CM

## 2013-10-31 DIAGNOSIS — I728 Aneurysm of other specified arteries: Secondary | ICD-10-CM

## 2013-10-31 DIAGNOSIS — I959 Hypotension, unspecified: Secondary | ICD-10-CM | POA: Diagnosis not present

## 2013-10-31 DIAGNOSIS — I4891 Unspecified atrial fibrillation: Secondary | ICD-10-CM | POA: Diagnosis not present

## 2013-10-31 DIAGNOSIS — R578 Other shock: Secondary | ICD-10-CM | POA: Diagnosis not present

## 2013-10-31 DIAGNOSIS — R579 Shock, unspecified: Secondary | ICD-10-CM

## 2013-10-31 DIAGNOSIS — R197 Diarrhea, unspecified: Secondary | ICD-10-CM | POA: Diagnosis present

## 2013-10-31 DIAGNOSIS — C3490 Malignant neoplasm of unspecified part of unspecified bronchus or lung: Secondary | ICD-10-CM | POA: Diagnosis present

## 2013-10-31 DIAGNOSIS — T8132XA Disruption of internal operation (surgical) wound, not elsewhere classified, initial encounter: Secondary | ICD-10-CM | POA: Diagnosis not present

## 2013-10-31 DIAGNOSIS — G8929 Other chronic pain: Secondary | ICD-10-CM | POA: Diagnosis present

## 2013-10-31 DIAGNOSIS — I714 Abdominal aortic aneurysm, without rupture, unspecified: Secondary | ICD-10-CM

## 2013-10-31 DIAGNOSIS — Z860100 Personal history of colon polyps, unspecified: Secondary | ICD-10-CM

## 2013-10-31 DIAGNOSIS — Z87891 Personal history of nicotine dependence: Secondary | ICD-10-CM

## 2013-10-31 DIAGNOSIS — K7460 Unspecified cirrhosis of liver: Secondary | ICD-10-CM | POA: Diagnosis present

## 2013-10-31 DIAGNOSIS — E039 Hypothyroidism, unspecified: Secondary | ICD-10-CM | POA: Diagnosis present

## 2013-10-31 DIAGNOSIS — IMO0002 Reserved for concepts with insufficient information to code with codable children: Secondary | ICD-10-CM

## 2013-10-31 DIAGNOSIS — R7309 Other abnormal glucose: Secondary | ICD-10-CM | POA: Diagnosis not present

## 2013-10-31 DIAGNOSIS — J95821 Acute postprocedural respiratory failure: Secondary | ICD-10-CM | POA: Diagnosis not present

## 2013-10-31 DIAGNOSIS — I2510 Atherosclerotic heart disease of native coronary artery without angina pectoris: Secondary | ICD-10-CM | POA: Diagnosis present

## 2013-10-31 DIAGNOSIS — J9819 Other pulmonary collapse: Secondary | ICD-10-CM | POA: Diagnosis not present

## 2013-10-31 DIAGNOSIS — Z9221 Personal history of antineoplastic chemotherapy: Secondary | ICD-10-CM

## 2013-10-31 DIAGNOSIS — N400 Enlarged prostate without lower urinary tract symptoms: Secondary | ICD-10-CM | POA: Diagnosis present

## 2013-10-31 DIAGNOSIS — M549 Dorsalgia, unspecified: Secondary | ICD-10-CM | POA: Diagnosis present

## 2013-10-31 DIAGNOSIS — J9601 Acute respiratory failure with hypoxia: Secondary | ICD-10-CM

## 2013-10-31 DIAGNOSIS — D689 Coagulation defect, unspecified: Secondary | ICD-10-CM | POA: Diagnosis not present

## 2013-10-31 DIAGNOSIS — T81329A Deep disruption or dehiscence of operation wound, unspecified, initial encounter: Secondary | ICD-10-CM | POA: Diagnosis not present

## 2013-10-31 DIAGNOSIS — I509 Heart failure, unspecified: Secondary | ICD-10-CM | POA: Diagnosis present

## 2013-10-31 DIAGNOSIS — L719 Rosacea, unspecified: Secondary | ICD-10-CM | POA: Diagnosis present

## 2013-10-31 DIAGNOSIS — K5730 Diverticulosis of large intestine without perforation or abscess without bleeding: Secondary | ICD-10-CM | POA: Diagnosis present

## 2013-10-31 DIAGNOSIS — K7682 Hepatic encephalopathy: Secondary | ICD-10-CM | POA: Diagnosis not present

## 2013-10-31 DIAGNOSIS — J449 Chronic obstructive pulmonary disease, unspecified: Secondary | ICD-10-CM

## 2013-10-31 DIAGNOSIS — J810 Acute pulmonary edema: Secondary | ICD-10-CM

## 2013-10-31 DIAGNOSIS — E43 Unspecified severe protein-calorie malnutrition: Secondary | ICD-10-CM | POA: Diagnosis present

## 2013-10-31 DIAGNOSIS — Z833 Family history of diabetes mellitus: Secondary | ICD-10-CM

## 2013-10-31 DIAGNOSIS — Z85118 Personal history of other malignant neoplasm of bronchus and lung: Secondary | ICD-10-CM

## 2013-10-31 DIAGNOSIS — E876 Hypokalemia: Secondary | ICD-10-CM

## 2013-10-31 DIAGNOSIS — D696 Thrombocytopenia, unspecified: Secondary | ICD-10-CM | POA: Diagnosis not present

## 2013-10-31 DIAGNOSIS — D62 Acute posthemorrhagic anemia: Secondary | ICD-10-CM | POA: Diagnosis not present

## 2013-10-31 DIAGNOSIS — M1710 Unilateral primary osteoarthritis, unspecified knee: Secondary | ICD-10-CM | POA: Diagnosis present

## 2013-10-31 DIAGNOSIS — R000 Tachycardia, unspecified: Secondary | ICD-10-CM | POA: Diagnosis not present

## 2013-10-31 HISTORY — PX: ABDOMINAL AORTIC ANEURYSM REPAIR: SHX42

## 2013-10-31 HISTORY — PX: THROMBECTOMY ILIAC ARTERY: SHX6405

## 2013-10-31 LAB — CBC
HCT: 17.5 % — ABNORMAL LOW (ref 39.0–52.0)
HEMATOCRIT: 27.3 % — AB (ref 39.0–52.0)
Hemoglobin: 9.6 g/dL — ABNORMAL LOW (ref 13.0–17.0)
Hemoglobin: 6.2 g/dL — CL (ref 13.0–17.0)
MCH: 33.7 pg (ref 26.0–34.0)
MCH: 33.2 pg (ref 26.0–34.0)
MCHC: 35.2 g/dL (ref 30.0–36.0)
MCHC: 35.4 g/dL (ref 30.0–36.0)
MCV: 95.8 fL (ref 78.0–100.0)
MCV: 93.6 fL (ref 78.0–100.0)
PLATELETS: 125 10*3/uL — AB (ref 150–400)
Platelets: 69 10*3/uL — ABNORMAL LOW (ref 150–400)
RBC: 2.85 MIL/uL — AB (ref 4.22–5.81)
RBC: 1.87 MIL/uL — ABNORMAL LOW (ref 4.22–5.81)
RDW: 14.7 % (ref 11.5–15.5)
RDW: 15.7 % — ABNORMAL HIGH (ref 11.5–15.5)
WBC: 7.3 10*3/uL (ref 4.0–10.5)
WBC: 7.3 10*3/uL (ref 4.0–10.5)

## 2013-10-31 LAB — BASIC METABOLIC PANEL
BUN: 11 mg/dL (ref 6–23)
BUN: 13 mg/dL (ref 6–23)
CALCIUM: 7.1 mg/dL — AB (ref 8.4–10.5)
CO2: 23 mEq/L (ref 19–32)
CO2: 22 mEq/L (ref 19–32)
Calcium: 7.3 mg/dL — ABNORMAL LOW (ref 8.4–10.5)
Chloride: 108 mEq/L (ref 96–112)
Chloride: 104 mEq/L (ref 96–112)
Creatinine, Ser: 0.63 mg/dL (ref 0.50–1.35)
Creatinine, Ser: 0.74 mg/dL (ref 0.50–1.35)
GFR calc non Af Amer: >90 mL/min (ref >90)
Glucose, Bld: 156 mg/dL — ABNORMAL HIGH (ref 70–99)
Glucose, Bld: 198 mg/dL — ABNORMAL HIGH (ref 70–99)
Potassium: 4.2 mEq/L (ref 3.7–5.3)
Potassium: 3.9 mEq/L (ref 3.7–5.3)
SODIUM: 143 meq/L (ref 137–147)
SODIUM: 141 meq/L (ref 137–147)

## 2013-10-31 LAB — POCT I-STAT 3, ART BLOOD GAS (G3+)
Acid-base deficit: 2 mmol/L (ref 0.0–2.0)
BICARBONATE: 24.1 meq/L — AB (ref 20.0–24.0)
O2 Saturation: 91 %
PCO2 ART: 46.1 mmHg — AB (ref 35.0–45.0)
Patient temperature: 36.4
TCO2: 26 mmol/L (ref 0–100)
pH, Arterial: 7.324 — ABNORMAL LOW (ref 7.350–7.450)
pO2, Arterial: 63 mmHg — ABNORMAL LOW (ref 80.0–100.0)

## 2013-10-31 LAB — BLOOD PRODUCT ORDER (VERBAL) VERIFICATION

## 2013-10-31 LAB — POCT I-STAT 7, (LYTES, BLD GAS, ICA,H+H)
ACID-BASE DEFICIT: 2 mmol/L (ref 0.0–2.0)
ACID-BASE DEFICIT: 6 mmol/L — AB (ref 0.0–2.0)
ACID-BASE DEFICIT: 5 mmol/L — AB (ref 0.0–2.0)
Acid-Base Excess: 2 mmol/L (ref 0.0–2.0)
Acid-base deficit: 3 mmol/L — ABNORMAL HIGH (ref 0.0–2.0)
Acid-base deficit: 3 mmol/L — ABNORMAL HIGH (ref 0.0–2.0)
Acid-base deficit: 1 mmol/L (ref 0.0–2.0)
BICARBONATE: 22.3 meq/L (ref 20.0–24.0)
BICARBONATE: 24.7 meq/L — AB (ref 20.0–24.0)
Bicarbonate: 23.6 mEq/L (ref 20.0–24.0)
Bicarbonate: 22.6 mEq/L (ref 20.0–24.0)
Bicarbonate: 22.1 mEq/L (ref 20.0–24.0)
Bicarbonate: 22.7 mEq/L (ref 20.0–24.0)
Bicarbonate: 27.9 mEq/L — ABNORMAL HIGH (ref 20.0–24.0)
Bicarbonate: 24.4 mEq/L — ABNORMAL HIGH (ref 20.0–24.0)
CALCIUM ION: 1.12 mmol/L — AB (ref 1.13–1.30)
CALCIUM ION: 0.82 mmol/L — AB (ref 1.13–1.30)
Calcium, Ion: 1.13 mmol/L (ref 1.13–1.30)
Calcium, Ion: 1.23 mmol/L (ref 1.13–1.30)
Calcium, Ion: 1.12 mmol/L — ABNORMAL LOW (ref 1.13–1.30)
Calcium, Ion: 0.99 mmol/L — ABNORMAL LOW (ref 1.13–1.30)
Calcium, Ion: 1.07 mmol/L — ABNORMAL LOW (ref 1.13–1.30)
Calcium, Ion: 0.97 mmol/L — ABNORMAL LOW (ref 1.13–1.30)
HCT: 35 % — ABNORMAL LOW (ref 39.0–52.0)
HCT: 32 % — ABNORMAL LOW (ref 39.0–52.0)
HCT: 37 % — ABNORMAL LOW (ref 39.0–52.0)
HCT: 32 % — ABNORMAL LOW (ref 39.0–52.0)
HCT: 32 % — ABNORMAL LOW (ref 39.0–52.0)
HCT: 30 % — ABNORMAL LOW (ref 39.0–52.0)
HEMATOCRIT: 31 % — AB (ref 39.0–52.0)
HEMATOCRIT: 24 % — AB (ref 39.0–52.0)
HEMOGLOBIN: 11.9 g/dL — AB (ref 13.0–17.0)
HEMOGLOBIN: 10.5 g/dL — AB (ref 13.0–17.0)
Hemoglobin: 10.9 g/dL — ABNORMAL LOW (ref 13.0–17.0)
Hemoglobin: 12.6 g/dL — ABNORMAL LOW (ref 13.0–17.0)
Hemoglobin: 10.9 g/dL — ABNORMAL LOW (ref 13.0–17.0)
Hemoglobin: 10.9 g/dL — ABNORMAL LOW (ref 13.0–17.0)
Hemoglobin: 10.2 g/dL — ABNORMAL LOW (ref 13.0–17.0)
Hemoglobin: 8.2 g/dL — ABNORMAL LOW (ref 13.0–17.0)
O2 SAT: 98 %
O2 SAT: 99 %
O2 SAT: 99 %
O2 SAT: 100 %
O2 Saturation: 100 %
O2 Saturation: 99 %
O2 Saturation: 100 %
O2 Saturation: 100 %
PCO2 ART: 39.4 mmHg (ref 35.0–45.0)
PCO2 ART: 47.9 mmHg — AB (ref 35.0–45.0)
PH ART: 7.395 (ref 7.350–7.450)
PH ART: 7.364 (ref 7.350–7.450)
PH ART: 7.369 (ref 7.350–7.450)
PH ART: 7.402 (ref 7.350–7.450)
PO2 ART: 184 mmHg — AB (ref 80.0–100.0)
PO2 ART: 163 mmHg — AB (ref 80.0–100.0)
POTASSIUM: 4 meq/L (ref 3.7–5.3)
POTASSIUM: 4.7 meq/L (ref 3.7–5.3)
POTASSIUM: 4.3 meq/L (ref 3.7–5.3)
Patient temperature: 35.8
Patient temperature: 36.4
Patient temperature: 36.5
Patient temperature: 36.5
Patient temperature: 36.2
Patient temperature: 36.3
Potassium: 3.3 mEq/L — ABNORMAL LOW (ref 3.7–5.3)
Potassium: 4 mEq/L (ref 3.7–5.3)
Potassium: 4.9 mEq/L (ref 3.7–5.3)
Potassium: 5 mEq/L (ref 3.7–5.3)
Potassium: 4.6 mEq/L (ref 3.7–5.3)
SODIUM: 139 meq/L (ref 137–147)
SODIUM: 142 meq/L (ref 137–147)
SODIUM: 143 meq/L (ref 137–147)
Sodium: 141 mEq/L (ref 137–147)
Sodium: 139 mEq/L (ref 137–147)
Sodium: 139 mEq/L (ref 137–147)
Sodium: 141 mEq/L (ref 137–147)
Sodium: 141 mEq/L (ref 137–147)
TCO2: 23 mmol/L (ref 0–100)
TCO2: 25 mmol/L (ref 0–100)
TCO2: 24 mmol/L (ref 0–100)
TCO2: 24 mmol/L (ref 0–100)
TCO2: 24 mmol/L (ref 0–100)
TCO2: 29 mmol/L (ref 0–100)
TCO2: 26 mmol/L (ref 0–100)
TCO2: 26 mmol/L (ref 0–100)
pCO2 arterial: 36.1 mmHg (ref 35.0–45.0)
pCO2 arterial: 40.2 mmHg (ref 35.0–45.0)
pCO2 arterial: 50.1 mmHg — ABNORMAL HIGH (ref 35.0–45.0)
pCO2 arterial: 53.1 mmHg — ABNORMAL HIGH (ref 35.0–45.0)
pCO2 arterial: 40.7 mmHg (ref 35.0–45.0)
pCO2 arterial: 39.5 mmHg (ref 35.0–45.0)
pH, Arterial: 7.373 (ref 7.350–7.450)
pH, Arterial: 7.249 — ABNORMAL LOW (ref 7.350–7.450)
pH, Arterial: 7.234 — ABNORMAL LOW (ref 7.350–7.450)
pH, Arterial: 7.383 (ref 7.350–7.450)
pO2, Arterial: 200 mmHg — ABNORMAL HIGH (ref 80.0–100.0)
pO2, Arterial: 100 mmHg (ref 80.0–100.0)
pO2, Arterial: 145 mmHg — ABNORMAL HIGH (ref 80.0–100.0)
pO2, Arterial: 186 mmHg — ABNORMAL HIGH (ref 80.0–100.0)
pO2, Arterial: 176 mmHg — ABNORMAL HIGH (ref 80.0–100.0)
pO2, Arterial: 209 mmHg — ABNORMAL HIGH (ref 80.0–100.0)

## 2013-10-31 LAB — MAGNESIUM: MAGNESIUM: 1.1 mg/dL — AB (ref 1.5–2.5)

## 2013-10-31 LAB — TROPONIN I

## 2013-10-31 LAB — PROTIME-INR
INR: 1.7 — ABNORMAL HIGH (ref 0.00–1.49)
INR: 1.97 — ABNORMAL HIGH (ref 0.00–1.49)
INR: 1.32 (ref 0.00–1.49)
PROTHROMBIN TIME: 19.5 s — AB (ref 11.6–15.2)
PROTHROMBIN TIME: 21.8 s — AB (ref 11.6–15.2)
PROTHROMBIN TIME: 16.1 s — AB (ref 11.6–15.2)

## 2013-10-31 LAB — APTT
aPTT: 47 seconds — ABNORMAL HIGH (ref 24–37)
aPTT: 33 seconds (ref 24–37)

## 2013-10-31 LAB — POCT ACTIVATED CLOTTING TIME: ACTIVATED CLOTTING TIME: 232 s

## 2013-10-31 LAB — GLUCOSE, CAPILLARY: Glucose-Capillary: 164 mg/dL — ABNORMAL HIGH (ref 70–99)

## 2013-10-31 LAB — FIBRINOGEN: FIBRINOGEN: 175 mg/dL — AB (ref 204–475)

## 2013-10-31 LAB — PREPARE RBC (CROSSMATCH)

## 2013-10-31 SURGERY — ANEURYSM ABDOMINAL AORTIC REPAIR
Anesthesia: General | Site: Groin

## 2013-10-31 MED ORDER — FENTANYL CITRATE 0.05 MG/ML IJ SOLN
INTRAMUSCULAR | Status: AC
  Filled 2013-10-31: qty 5

## 2013-10-31 MED ORDER — SODIUM BICARBONATE 4.2 % IV SOLN
INTRAVENOUS | Status: DC | PRN
  Administered 2013-10-31: 50 meq via INTRAVENOUS
  Administered 2013-10-31: 100 meq via INTRAVENOUS

## 2013-10-31 MED ORDER — MORPHINE SULFATE 2 MG/ML IJ SOLN: 2.0000 mg | INTRAMUSCULAR | Status: DC | PRN

## 2013-10-31 MED ORDER — OXYCODONE HCL 5 MG PO TABS: 5.0000 mg | ORAL_TABLET | Freq: Once | ORAL | Status: DC | PRN

## 2013-10-31 MED ORDER — GUAIFENESIN-DM 100-10 MG/5ML PO SYRP: 15.0000 mL | ORAL_SOLUTION | ORAL | Status: DC | PRN

## 2013-10-31 MED ORDER — ACETAMINOPHEN 325 MG PO TABS
325.0000 mg | ORAL_TABLET | ORAL | Status: DC | PRN
  Administered 2013-11-06 – 2013-11-10 (×2): 650 mg via ORAL
  Filled 2013-10-31 (×2): qty 2

## 2013-10-31 MED ORDER — ONDANSETRON HCL 4 MG/2ML IJ SOLN
4.0000 mg | Freq: Four times a day (QID) | INTRAMUSCULAR | Status: DC | PRN
  Filled 2013-10-31: qty 2

## 2013-10-31 MED ORDER — LABETALOL HCL 5 MG/ML IV SOLN: 10.0000 mg | INTRAVENOUS | Status: DC | PRN

## 2013-10-31 MED ORDER — MAGNESIUM SULFATE 40 MG/ML IJ SOLN
2.0000 g | Freq: Every day | INTRAMUSCULAR | Status: AC | PRN
  Administered 2013-10-31: 2 g via INTRAVENOUS
  Filled 2013-10-31: qty 50

## 2013-10-31 MED ORDER — DEXTROSE 5 % IV SOLN
30.0000 ug/min | INTRAVENOUS | Status: DC
  Administered 2013-10-31: 100 ug/min via INTRAVENOUS
  Administered 2013-11-01 (×2): 110 ug/min via INTRAVENOUS
  Administered 2013-11-02: 30 ug/min via INTRAVENOUS
  Administered 2013-11-02: 185 ug/min via INTRAVENOUS
  Administered 2013-11-02: 95 ug/min via INTRAVENOUS
  Filled 2013-10-31 (×8): qty 4

## 2013-10-31 MED ORDER — DEXMEDETOMIDINE HCL IN NACL 400 MCG/100ML IV SOLN: 0.4000 ug/kg/h | INTRAVENOUS | Status: DC

## 2013-10-31 MED ORDER — CHLORHEXIDINE GLUCONATE 4 % EX LIQD
60.0000 mL | Freq: Once | CUTANEOUS | Status: DC
  Filled 2013-10-31: qty 60

## 2013-10-31 MED ORDER — SODIUM CHLORIDE 0.9 % IV SOLN: INTRAVENOUS | Status: DC

## 2013-10-31 MED ORDER — DEXMEDETOMIDINE HCL IN NACL 200 MCG/50ML IV SOLN
0.4000 ug/kg/h | INTRAVENOUS | Status: DC
  Administered 2013-10-31: 0.7 ug/kg/h via INTRAVENOUS
  Filled 2013-10-31: qty 50

## 2013-10-31 MED ORDER — LACTATED RINGERS IV SOLN
INTRAVENOUS | Status: DC | PRN
  Administered 2013-10-31 (×2): via INTRAVENOUS

## 2013-10-31 MED ORDER — EPHEDRINE SULFATE 50 MG/ML IJ SOLN
INTRAMUSCULAR | Status: DC | PRN
  Administered 2013-10-31: 10 mg via INTRAVENOUS
  Administered 2013-10-31: 5 mg via INTRAVENOUS
  Administered 2013-10-31: 10 mg via INTRAVENOUS
  Administered 2013-10-31: 5 mg via INTRAVENOUS
  Administered 2013-10-31 (×2): 10 mg via INTRAVENOUS
  Administered 2013-10-31: 5 mg via INTRAVENOUS

## 2013-10-31 MED ORDER — SODIUM CHLORIDE 0.9 % IR SOLN
Status: DC | PRN
  Administered 2013-10-31: 07:00:00

## 2013-10-31 MED ORDER — DOCUSATE SODIUM 100 MG PO CAPS
100.0000 mg | ORAL_CAPSULE | Freq: Every day | ORAL | Status: DC
  Administered 2013-11-03: 100 mg via ORAL
  Filled 2013-10-31: qty 1

## 2013-10-31 MED ORDER — DEXMEDETOMIDINE HCL IN NACL 400 MCG/100ML IV SOLN
0.4000 ug/kg/h | INTRAVENOUS | Status: DC
  Filled 2013-10-31: qty 100

## 2013-10-31 MED ORDER — PROPOFOL 10 MG/ML IV BOLUS
INTRAVENOUS | Status: DC | PRN
  Administered 2013-10-31: 140 mg via INTRAVENOUS

## 2013-10-31 MED ORDER — DEXTROSE 5 % IV SOLN
1.5000 g | Freq: Two times a day (BID) | INTRAVENOUS | Status: AC
  Administered 2013-10-31 – 2013-11-01 (×2): 1.5 g via INTRAVENOUS
  Filled 2013-10-31 (×2): qty 1.5

## 2013-10-31 MED ORDER — ROCURONIUM BROMIDE 50 MG/5ML IV SOLN
INTRAVENOUS | Status: AC
  Filled 2013-10-31: qty 2

## 2013-10-31 MED ORDER — HEPARIN SODIUM (PORCINE) 1000 UNIT/ML IJ SOLN
INTRAMUSCULAR | Status: DC | PRN
  Administered 2013-10-31: 10000 [IU] via INTRAVENOUS
  Administered 2013-10-31 (×2): 5000 [IU] via INTRAVENOUS

## 2013-10-31 MED ORDER — FENTANYL BOLUS VIA INFUSION
25.0000 ug | INTRAVENOUS | Status: DC | PRN
  Filled 2013-10-31: qty 50

## 2013-10-31 MED ORDER — METOPROLOL TARTRATE 1 MG/ML IV SOLN: 2.0000 mg | INTRAVENOUS | Status: DC | PRN

## 2013-10-31 MED ORDER — BISACODYL 10 MG RE SUPP: 10.0000 mg | Freq: Every day | RECTAL | Status: DC | PRN

## 2013-10-31 MED ORDER — FENTANYL CITRATE 0.05 MG/ML IJ SOLN
50.0000 ug | Freq: Once | INTRAMUSCULAR | Status: AC
  Administered 2013-10-31: 50 ug via INTRAVENOUS
  Filled 2013-10-31: qty 2

## 2013-10-31 MED ORDER — MIDAZOLAM HCL 2 MG/2ML IJ SOLN
INTRAMUSCULAR | Status: AC
  Filled 2013-10-31: qty 2

## 2013-10-31 MED ORDER — HEPARIN SODIUM (PORCINE) 1000 UNIT/ML IJ SOLN
INTRAMUSCULAR | Status: AC
  Filled 2013-10-31: qty 2

## 2013-10-31 MED ORDER — DEXTROSE-NACL 5-0.45 % IV SOLN
INTRAVENOUS | Status: DC
  Administered 2013-10-31: 125 mL/h via INTRAVENOUS
  Administered 2013-11-01: 125 mL via INTRAVENOUS
  Administered 2013-11-01 – 2013-11-03 (×3): via INTRAVENOUS

## 2013-10-31 MED ORDER — HYDRALAZINE HCL 20 MG/ML IJ SOLN: 10.0000 mg | INTRAMUSCULAR | Status: DC | PRN

## 2013-10-31 MED ORDER — CHLORHEXIDINE GLUCONATE 4 % EX LIQD
60.0000 mL | Freq: Once | CUTANEOUS | Status: DC
Start: 2013-10-31 — End: 2013-10-31
  Filled 2013-10-31: qty 60

## 2013-10-31 MED ORDER — CALCIUM CHLORIDE 10 % IV SOLN
INTRAVENOUS | Status: DC | PRN
  Administered 2013-10-31 (×2): 200 mg via INTRAVENOUS
  Administered 2013-10-31: 400 mg via INTRAVENOUS
  Administered 2013-10-31: 200 mg via INTRAVENOUS

## 2013-10-31 MED ORDER — PHENYLEPHRINE HCL 10 MG/ML IJ SOLN
10.0000 mg | INTRAVENOUS | Status: DC | PRN
  Administered 2013-10-31: 25 ug via INTRAVENOUS

## 2013-10-31 MED ORDER — PROTAMINE SULFATE 10 MG/ML IV SOLN
INTRAVENOUS | Status: AC
  Filled 2013-10-31: qty 10

## 2013-10-31 MED ORDER — CEFUROXIME SODIUM 1.5 G IJ SOLR
1.5000 g | INTRAMUSCULAR | Status: DC | PRN
  Administered 2013-10-31 (×3): 1.5 g via INTRAVENOUS

## 2013-10-31 MED ORDER — PHENYLEPHRINE HCL 10 MG/ML IJ SOLN
INTRAMUSCULAR | Status: AC
  Filled 2013-10-31: qty 2

## 2013-10-31 MED ORDER — SODIUM CHLORIDE 0.9 % IV SOLN
0.0000 ug/h | INTRAVENOUS | Status: DC
  Administered 2013-10-31: 50 ug/h via INTRAVENOUS
  Filled 2013-10-31: qty 50

## 2013-10-31 MED ORDER — ALBUMIN HUMAN 5 % IV SOLN
INTRAVENOUS | Status: DC | PRN
  Administered 2013-10-31 (×8): via INTRAVENOUS

## 2013-10-31 MED ORDER — SODIUM CHLORIDE 0.9 % IV SOLN: 500.0000 mL | Freq: Once | INTRAVENOUS | Status: AC | PRN

## 2013-10-31 MED ORDER — LIDOCAINE HCL (CARDIAC) 20 MG/ML IV SOLN
INTRAVENOUS | Status: DC | PRN
  Administered 2013-10-31: 70 mg via INTRAVENOUS

## 2013-10-31 MED ORDER — ONDANSETRON HCL 4 MG/2ML IJ SOLN: 4.0000 mg | Freq: Four times a day (QID) | INTRAMUSCULAR | Status: DC | PRN

## 2013-10-31 MED ORDER — OXYCODONE HCL 5 MG/5ML PO SOLN: 5.0000 mg | Freq: Once | ORAL | Status: DC | PRN

## 2013-10-31 MED ORDER — THROMBIN 20000 UNITS EX SOLR
CUTANEOUS | Status: AC
  Filled 2013-10-31: qty 20000

## 2013-10-31 MED ORDER — INSULIN ASPART 100 UNIT/ML ~~LOC~~ SOLN
0.0000 [IU] | SUBCUTANEOUS | Status: DC
  Administered 2013-10-31: 3 [IU] via SUBCUTANEOUS
  Administered 2013-11-01: 2 [IU] via SUBCUTANEOUS
  Administered 2013-11-01 (×2): 3 [IU] via SUBCUTANEOUS
  Administered 2013-11-02: 2 [IU] via SUBCUTANEOUS
  Administered 2013-11-02: 3 [IU] via SUBCUTANEOUS

## 2013-10-31 MED ORDER — ROCURONIUM BROMIDE 50 MG/5ML IV SOLN
INTRAVENOUS | Status: AC
  Filled 2013-10-31: qty 1

## 2013-10-31 MED ORDER — SODIUM CHLORIDE 0.9 % IV SOLN
INTRAVENOUS | Status: DC
  Administered 2013-10-31: 20 mL via INTRAVENOUS
  Administered 2013-11-01: 23:00:00 via INTRAVENOUS

## 2013-10-31 MED ORDER — 0.9 % SODIUM CHLORIDE (POUR BTL) OPTIME
TOPICAL | Status: DC | PRN
  Administered 2013-10-31 (×2): 2000 mL

## 2013-10-31 MED ORDER — ROCURONIUM BROMIDE 100 MG/10ML IV SOLN
INTRAVENOUS | Status: DC | PRN
  Administered 2013-10-31: 40 mg via INTRAVENOUS
  Administered 2013-10-31: 30 mg via INTRAVENOUS
  Administered 2013-10-31: 60 mg via INTRAVENOUS
  Administered 2013-10-31: 20 mg via INTRAVENOUS

## 2013-10-31 MED ORDER — PHENOL 1.4 % MT LIQD: 1.0000 | OROMUCOSAL | Status: DC | PRN

## 2013-10-31 MED ORDER — ACETAMINOPHEN 650 MG RE SUPP
325.0000 mg | RECTAL | Status: DC | PRN
  Administered 2013-11-01: 325 mg via RECTAL
  Filled 2013-10-31: qty 1

## 2013-10-31 MED ORDER — DEXTROSE 5 % IV SOLN
1.5000 g | INTRAVENOUS | Status: DC
Start: 2013-10-31 — End: 2013-10-31
  Filled 2013-10-31: qty 1.5

## 2013-10-31 MED ORDER — IPRATROPIUM-ALBUTEROL 0.5-2.5 (3) MG/3ML IN SOLN
3.0000 mL | Freq: Four times a day (QID) | RESPIRATORY_TRACT | Status: DC
  Administered 2013-10-31 – 2013-11-07 (×29): 3 mL via RESPIRATORY_TRACT
  Filled 2013-10-31 (×29): qty 3

## 2013-10-31 MED ORDER — LACTATED RINGERS IV SOLN
INTRAVENOUS | Status: DC | PRN
  Administered 2013-10-31 (×3): via INTRAVENOUS

## 2013-10-31 MED ORDER — PROPOFOL 10 MG/ML IV BOLUS
INTRAVENOUS | Status: AC
  Filled 2013-10-31: qty 20

## 2013-10-31 MED ORDER — SODIUM CHLORIDE 0.45 % IV SOLN
INTRAVENOUS | Status: DC
  Administered 2013-10-31: 20 mL/h via INTRAVENOUS

## 2013-10-31 MED ORDER — MANNITOL 25 % IV SOLN
INTRAVENOUS | Status: DC | PRN
  Administered 2013-10-31: 12.5 g via INTRAVENOUS

## 2013-10-31 MED ORDER — FENTANYL CITRATE 0.05 MG/ML IJ SOLN
INTRAMUSCULAR | Status: AC
  Filled 2013-10-31: qty 2

## 2013-10-31 MED ORDER — CALCIUM CHLORIDE 10 % IV SOLN
INTRAVENOUS | Status: AC
  Filled 2013-10-31: qty 10

## 2013-10-31 MED ORDER — DOPAMINE-DEXTROSE 3.2-5 MG/ML-% IV SOLN
3.0000 ug/kg/min | INTRAVENOUS | Status: DC
Start: 2013-10-31 — End: 2013-11-01

## 2013-10-31 MED ORDER — THROMBIN 20000 UNITS EX SOLR
CUTANEOUS | Status: DC | PRN
  Administered 2013-10-31: 14:00:00 via TOPICAL

## 2013-10-31 MED ORDER — SODIUM BICARBONATE 8.4 % IV SOLN
INTRAVENOUS | Status: AC
  Filled 2013-10-31: qty 50

## 2013-10-31 MED ORDER — PROTAMINE SULFATE 10 MG/ML IV SOLN
INTRAVENOUS | Status: DC | PRN
  Administered 2013-10-31: 200 mg via INTRAVENOUS

## 2013-10-31 MED ORDER — FENTANYL CITRATE 0.05 MG/ML IJ SOLN
INTRAMUSCULAR | Status: DC | PRN
  Administered 2013-10-31 (×2): 100 ug via INTRAVENOUS
  Administered 2013-10-31: 150 ug via INTRAVENOUS
  Administered 2013-10-31 (×2): 50 ug via INTRAVENOUS
  Administered 2013-10-31: 150 ug via INTRAVENOUS
  Administered 2013-10-31: 50 ug via INTRAVENOUS
  Administered 2013-10-31: 150 ug via INTRAVENOUS
  Administered 2013-10-31 (×3): 50 ug via INTRAVENOUS
  Administered 2013-10-31 (×3): 100 ug via INTRAVENOUS

## 2013-10-31 MED ORDER — POTASSIUM CHLORIDE CRYS ER 20 MEQ PO TBCR: 20.0000 meq | EXTENDED_RELEASE_TABLET | Freq: Every day | ORAL | Status: DC | PRN

## 2013-10-31 MED ORDER — SODIUM CHLORIDE 0.9 % IV SOLN
INTRAVENOUS | Status: DC | PRN
  Administered 2013-10-31 (×5): via INTRAVENOUS

## 2013-10-31 MED ORDER — CHLORHEXIDINE GLUCONATE 0.12 % MT SOLN
15.0000 mL | Freq: Two times a day (BID) | OROMUCOSAL | Status: DC
  Administered 2013-10-31 – 2013-11-13 (×22): 15 mL via OROMUCOSAL
  Filled 2013-10-31 (×27): qty 15

## 2013-10-31 MED ORDER — HYDROMORPHONE HCL PF 1 MG/ML IJ SOLN: 0.2500 mg | INTRAMUSCULAR | Status: DC | PRN

## 2013-10-31 MED ORDER — MIDAZOLAM HCL 5 MG/5ML IJ SOLN
INTRAMUSCULAR | Status: DC | PRN
  Administered 2013-10-31: 2 mg via INTRAVENOUS

## 2013-10-31 MED ORDER — LACTATED RINGERS IV SOLN
INTRAVENOUS | Status: DC | PRN
  Administered 2013-10-31 (×4): via INTRAVENOUS

## 2013-10-31 MED ORDER — BIOTENE DRY MOUTH MT LIQD
15.0000 mL | Freq: Four times a day (QID) | OROMUCOSAL | Status: DC
  Administered 2013-11-01 – 2013-11-13 (×39): 15 mL via OROMUCOSAL

## 2013-10-31 MED ORDER — PHENYLEPHRINE HCL 10 MG/ML IJ SOLN
30.0000 ug/min | INTRAMUSCULAR | Status: DC
  Administered 2013-10-31: 45 ug/min via INTRAVENOUS
  Filled 2013-10-31: qty 1

## 2013-10-31 MED ORDER — DEXMEDETOMIDINE HCL IN NACL 200 MCG/50ML IV SOLN
INTRAVENOUS | Status: DC | PRN
  Administered 2013-10-31: 0.2 ug/kg/h via INTRAVENOUS

## 2013-10-31 MED ORDER — PANTOPRAZOLE SODIUM 40 MG IV SOLR
40.0000 mg | Freq: Every day | INTRAVENOUS | Status: DC
  Administered 2013-10-31 – 2013-11-07 (×8): 40 mg via INTRAVENOUS
  Filled 2013-10-31 (×9): qty 40

## 2013-10-31 MED ORDER — EPHEDRINE SULFATE 50 MG/ML IJ SOLN
INTRAMUSCULAR | Status: AC
  Filled 2013-10-31: qty 1

## 2013-10-31 SURGICAL SUPPLY — 76 items
ATTRACTOMAT 16X20 MAGNETIC DRP (DRAPES) ×3 IMPLANT
BLADE SURG ROTATE 9660 (MISCELLANEOUS) ×1 IMPLANT
CANISTER SUCTION 2500CC (MISCELLANEOUS) ×3 IMPLANT
CANNULA VESSEL 3MM 2 BLNT TIP (CANNULA) ×3 IMPLANT
CATH EMB 3FR 80CM (CATHETERS) ×1 IMPLANT
CATH EMB 5FR 80CM (CATHETERS) ×1 IMPLANT
CLIP TI MEDIUM 24 (CLIP) ×3 IMPLANT
CLIP TI WIDE RED SMALL 24 (CLIP) ×3 IMPLANT
COUNTER NEEDLE 20 DBL MAG RED (NEEDLE) ×1 IMPLANT
COVER SURGICAL LIGHT HANDLE (MISCELLANEOUS) ×3 IMPLANT
DRAPE INCISE IOBAN 66X45 STRL (DRAPES) ×2 IMPLANT
DRAPE WARM FLUID 44X44 (DRAPE) ×3 IMPLANT
DRSG COVADERM 4X14 (GAUZE/BANDAGES/DRESSINGS) ×1 IMPLANT
DRSG COVADERM 4X8 (GAUZE/BANDAGES/DRESSINGS) ×1 IMPLANT
ELECT BLADE 4.0 EZ CLEAN MEGAD (MISCELLANEOUS) ×3
ELECT BLADE 6.5 EXT (BLADE) IMPLANT
ELECT REM PT RETURN 9FT ADLT (ELECTROSURGICAL) ×6
ELECTRODE BLDE 4.0 EZ CLN MEGD (MISCELLANEOUS) IMPLANT
ELECTRODE REM PT RTRN 9FT ADLT (ELECTROSURGICAL) ×2 IMPLANT
GAUZE SPONGE 4X4 16PLY XRAY LF (GAUZE/BANDAGES/DRESSINGS) ×1 IMPLANT
GLOVE BIO SURGEON STRL SZ7 (GLOVE) ×1 IMPLANT
GLOVE BIO SURGEON STRL SZ7.5 (GLOVE) ×3 IMPLANT
GLOVE BIOGEL PI IND STRL 6 (GLOVE) IMPLANT
GLOVE BIOGEL PI IND STRL 6.5 (GLOVE) IMPLANT
GLOVE BIOGEL PI IND STRL 7.0 (GLOVE) IMPLANT
GLOVE BIOGEL PI IND STRL 7.5 (GLOVE) IMPLANT
GLOVE BIOGEL PI INDICATOR 6 (GLOVE) ×1
GLOVE BIOGEL PI INDICATOR 6.5 (GLOVE) ×3
GLOVE BIOGEL PI INDICATOR 7.0 (GLOVE) ×2
GLOVE BIOGEL PI INDICATOR 7.5 (GLOVE) ×4
GLOVE ECLIPSE 6.5 STRL STRAW (GLOVE) ×1 IMPLANT
GLOVE SS BIOGEL STRL SZ 6.5 (GLOVE) IMPLANT
GLOVE SS BIOGEL STRL SZ 7 (GLOVE) IMPLANT
GLOVE SUPERSENSE BIOGEL SZ 6.5 (GLOVE) ×1
GLOVE SUPERSENSE BIOGEL SZ 7 (GLOVE) ×1
GLOVE SURG SS PI 7.0 STRL IVOR (GLOVE) ×4 IMPLANT
GOWN STRL REUS W/ TWL LRG LVL3 (GOWN DISPOSABLE) ×6 IMPLANT
GOWN STRL REUS W/ TWL XL LVL3 (GOWN DISPOSABLE) IMPLANT
GOWN STRL REUS W/TWL LRG LVL3 (GOWN DISPOSABLE) ×18
GOWN STRL REUS W/TWL XL LVL3 (GOWN DISPOSABLE) ×12
GRAFT HEMASHIELD 20X10 (Vascular Products) ×1 IMPLANT
INSERT FOGARTY 61MM (MISCELLANEOUS) ×4 IMPLANT
INSERT FOGARTY SM (MISCELLANEOUS) ×6 IMPLANT
KIT BASIN OR (CUSTOM PROCEDURE TRAY) ×3 IMPLANT
KIT ROOM TURNOVER OR (KITS) ×3 IMPLANT
LOOP VESSEL MINI RED (MISCELLANEOUS) ×1 IMPLANT
MARKER SKIN DUAL TIP RULER LAB (MISCELLANEOUS) ×1 IMPLANT
NS IRRIG 1000ML POUR BTL (IV SOLUTION) ×8 IMPLANT
PACK AORTA (CUSTOM PROCEDURE TRAY) ×3 IMPLANT
PAD ARMBOARD 7.5X6 YLW CONV (MISCELLANEOUS) ×6 IMPLANT
PUNCH AORTIC ROTATE 5MM 8IN (MISCELLANEOUS) ×1 IMPLANT
RETAINER VISCERA MED (MISCELLANEOUS) ×3 IMPLANT
SPONGE LAP 18X18 X RAY DECT (DISPOSABLE) ×10 IMPLANT
SPONGE SURGIFOAM ABS GEL 100 (HEMOSTASIS) ×1 IMPLANT
STAPLER VISISTAT 35W (STAPLE) ×5 IMPLANT
SUT PDS AB 1 TP1 54 (SUTURE) ×6 IMPLANT
SUT PROLENE 3 0 SH DA (SUTURE) ×6 IMPLANT
SUT PROLENE 3 0 SH1 36 (SUTURE) ×12 IMPLANT
SUT PROLENE 5 0 C 1 36 (SUTURE) ×9 IMPLANT
SUT PROLENE 5 0 CC 1 (SUTURE) ×6 IMPLANT
SUT PROLENE 6 0 C 1 30 (SUTURE) ×3 IMPLANT
SUT PROLENE 6 0 CC (SUTURE) ×2 IMPLANT
SUT SILK 2 0 (SUTURE) ×3
SUT SILK 2 0SH CR/8 30 (SUTURE) ×4 IMPLANT
SUT SILK 2-0 18XBRD TIE 12 (SUTURE) IMPLANT
SUT SILK 3 0 (SUTURE) ×3
SUT SILK 3-0 18XBRD TIE 12 (SUTURE) IMPLANT
SUT VIC AB 2-0 CT1 36 (SUTURE) ×2 IMPLANT
SUT VIC AB 3-0 SH 27 (SUTURE) ×6
SUT VIC AB 3-0 SH 27X BRD (SUTURE) IMPLANT
SYR 3ML LL SCALE MARK (SYRINGE) ×1 IMPLANT
SYR BULB IRRIGATION 50ML (SYRINGE) ×1 IMPLANT
TOWEL OR 17X24 6PK STRL BLUE (TOWEL DISPOSABLE) ×6 IMPLANT
TOWEL OR 17X26 10 PK STRL BLUE (TOWEL DISPOSABLE) ×7 IMPLANT
TRAY FOLEY CATH 16FRSI W/METER (SET/KITS/TRAYS/PACK) ×3 IMPLANT
WATER STERILE IRR 1000ML POUR (IV SOLUTION) ×6 IMPLANT

## 2013-10-31 NOTE — Consult Note (Addendum)
Name: Joseph [name] MRN: [id number] DOB: 1941-02-08    ADMISSION DATE:  10/31/2013 CONSULTATION DATE:  3/3  REFERRING MD :  Oneida Alar  PRIMARY SERVICE: vascular   CHIEF COMPLAINT:  Post-op vent/critical care   BRIEF PATIENT DESCRIPTION:  This is a 73 year old male w/ several medical co-morbids: COPD, H/o lung cancer (observation since May of 2013 with no evidence for disease progression), and cirrhosis. Admitted on 3/3 for elective extensive AAA repair (see below). PCCM asked to see post-op to assist w/ post op care.   SIGNIFICANT EVENTS / STUDIES:  3/3: s/p Aorto to left and right external iliac bypass, left internal iliac artery bypass, ligation right internal iliac aneurysm, reimplantation of inferior mesenteric artery. Thrombectomy of left and right iliac and femoral arteries 3/3 hypotension, mild agitation   LINES / TUBES: OETT 3/3>>> Right PAC 3/3>>> Aline left rad 3/3>>>  CULTURES:   ANTIBIOTICS: zinacef 3/3>>>  HISTORY OF PRESENT ILLNESS:    This is a 73 year old male w/ several medical co-morbids: COPD, H/o lung cancer (observation since May of 2013 with no evidence for disease progression), and cirrhosis. Admitted on 3/3 for elective extensive AAA repair (see below). PCCM asked to see post-op to assist w/ post op care.  Extensive blood loss, neo required post op.   PAST MEDICAL HISTORY :  Past Medical History  Diagnosis Date  . History of colon polyps 2006,2008  . 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 Hyperlipidemia     takes Pravastatin  . Emphysema   . Lung cancer     right upper lobe  . Enlarged prostate     takes Rapaflo daily  . Arthritis     left knee and back arthrtis  . Hypothyroidism     takes Synthroid daily  . Cataract   . Hx of radiation therapy 09/02/11 to 10/19/11    chest  . Diverticulosis   . Cirrhosis     By radiography, seen on CT  . Rosacea   . CAD (coronary artery disease)   . Shortness of breath   . Heart murmur     teen    Past Surgical History  Procedure Laterality Date  . Hernia repair  70's  . Vasectomy  70's  . Finger surgery  2008    left pointer finger  . Knee arthroscopy  2008    left knee  . Tonsillectomy      as a child  . Colonoscopy w/ biopsies      multiple   . Cardiac catheterization  2003  . Esophagogastroduodenoscopy  2006  . Portacath placement  08/10/2011    Procedure: INSERTION PORT-A-CATH;  Surgeon: Pierre Bali, MD;  Location: [facility] OR;  Service: Thoracic;  Laterality: Left;  Left Subclavian   Prior to Admission medications   Medication Sig Start Date End Date Taking? Authorizing Provider  atorvastatin (LIPITOR) 10 MG tablet Take 1 tablet (10 mg total) by mouth daily. 10/09/13  Yes Dorothy Spark, MD  doxycycline (VIBRAMYCIN) 100 MG capsule Take 100 mg by mouth 2 (two) times daily.   Yes Historical Provider, MD  FINACEA 15 % cream Apply 1 application topically as directed. 12/01/12  Yes Historical Provider, MD  furosemide (LASIX) 20 MG tablet Take 20 mg by mouth daily. Not started yet 10/19/13  Yes Dorothy Spark, MD  HYDROcodone-acetaminophen (NORCO) 5-325 MG per tablet Take 1 tablet by mouth every 6 (six) hours as needed for pain. 01/04/12  Yes Carlton Adam, PA-C  MILK THISTLE PO Take 1 capsule by mouth daily. Take one 1200 mg capsule daily   Yes Historical Provider, MD  Multiple Vitamins-Minerals (MULTIVITAMINS THER. W/MINERALS) TABS Take 1 tablet by mouth daily.     Yes Historical Provider, MD  nystatin-triamcinolone (MYCOLOG II) cream Apply 1 application topically daily as needed. Rash 07/10/11  Yes Historical Provider, MD  OVER THE COUNTER MEDICATION 30 mg 3 (three) times daily. LUNG SUPPORT   Yes Historical Provider, MD  OVER THE COUNTER MEDICATION [place]  TAIL MUSHROOM  Takes 1 gr daily   Yes Historical Provider, MD  SYNTHROID 175 MCG tablet Take 175 mcg by mouth daily.  06/08/11  Yes Stephens Shire, MD  tamsulosin (FLOMAX) 0.4 MG CAPS Take 0.4 mg by mouth daily.   Yes  Historical Provider, MD  tiotropium (SPIRIVA) 18 MCG inhalation capsule Place 18 mcg into inhaler and inhale daily.   Yes Historical Provider, MD  lidocaine-prilocaine (EMLA) cream Apply 1 application topically as needed.   08/06/11   Curt Bears, MD  metroNIDAZOLE (METROGEL) 0.75 % gel Apply 1 application topically 2 (two) times daily.    Historical Provider, MD   No Known Allergies  FAMILY HISTORY:  Family History  Problem Relation Age of Onset  . Anesthesia problems Neg Hx   . Hypotension Neg Hx   . Malignant hyperthermia Neg Hx   . Pseudochol deficiency Neg Hx   . Cancer Father   . Diabetes Father   . Hyperlipidemia Brother   . Aneurysm Brother     Abdominal aortic aneurysm  . Hyperlipidemia Mother   . Hyperlipidemia Sister    SOCIAL HISTORY:  reports that he quit smoking about 2 years ago. His smoking use included Cigarettes. He has a 60 pack-year smoking history. He has quit using smokeless tobacco. He reports that he drinks alcohol. He reports that he does not use illicit drugs.  REVIEW OF SYSTEMS:  Unable   SUBJECTIVE:  Sedated on vent  VITAL SIGNS: Temp:  [97.5 F (36.4 C)-98.1 F (36.7 C)] 98.1 F (36.7 C) (03/03 2000) Pulse Rate:  [91-117] 98 (03/03 2000) Resp:  [13-26] 18 (03/03 2000) BP: (69-127)/(42-88) 96/68 mmHg (03/03 2000) SpO2:  [93 %-100 %] 100 % (03/03 2000) Arterial Line BP: (74-150)/(41-73) 99/58 mmHg (03/03 2000) FiO2 (%):  [50 %-60 %] 60 % (03/03 1938) Weight:  [98.232 kg (216 lb 9 oz)] 98.232 kg (216 lb 9 oz) (03/03 1830) HEMODYNAMICS: PAP: (31-48)/(18-26) 34/20 mmHg CO:  [5.5 L/min-5.8 L/min] 5.8 L/min CI:  [2.5 L/min/m2-2.7 L/min/m2] 2.7 L/min/m2 VENTILATOR SETTINGS: Vent Mode:  [-] PRVC FiO2 (%):  [50 %-60 %] 60 % Set Rate:  [12 bmp-16 bmp] 16 bmp Vt Set:  [620 mL] 620 mL PEEP:  [5 cmH20] 5 cmH20 Pressure Support:  [10 cmH20] 10 cmH20 Plateau Pressure:  [16 cmH20-17 cmH20] 16 cmH20 INTAKE / OUTPUT: Intake/Output     03/03 0701 -  03/04 0700   I.V. (mL/kg) 11325.2 (115.3)   Blood 8134   NG/GT 30   IV Piggyback 1800   Total Intake(mL/kg) 21289.2 (216.7)   Urine (mL/kg/hr) 925 (0.7)   Blood 8500 (6.5)   Total Output 9425   Net +11864.2         PHYSICAL EXAMINATION: General:  Sedated rass1, follows commands Neuro:  F/u, generalized weakness HEENT:  Orally intubated  Cardiovascular:  rrr Lungs:  Clear  Abdomen:  Mid abd dressing CD&I, hypoacitve  Musculoskeletal:  intact Skin:  Dry, slightly  jaundice appearing   LABS:  CBC  Recent Labs Lab 10/30/13 1407  10/31/13 1509 10/31/13 1607 10/31/13 1638  WBC 6.1  --   --   --  7.3  HGB 15.3  < > 10.2* 8.2* 9.6*  HCT 44.1  < > 30.0* 24.0* 27.3*  PLT 137*  --   --   --  69*  < > = values in this interval not displayed. Coag's  Recent Labs Lab 10/30/13 1407 10/31/13 1408 10/31/13 1638  APTT 34 >200* 47*  INR 1.07 1.70* 1.97*   BMET  Recent Labs Lab 10/30/13 1407  10/31/13 1509 10/31/13 1607 10/31/13 1739  NA 141  < > 142 143 143  K 4.4  < > 4.6 4.3 4.2  CL 106  --   --   --  108  CO2 20  --   --   --  23  BUN 13  --   --   --  11  CREATININE 0.79  --   --   --  0.63  GLUCOSE 90  --   --   --  156*  < > = values in this interval not displayed. Electrolytes  Recent Labs Lab 10/30/13 1407 10/31/13 1739  CALCIUM 9.4 7.1*  MG  --  1.1*   Sepsis Markers No results found for this basename: LATICACIDVEN, PROCALCITON, O2SATVEN,  in the last 168 hours ABG  Recent Labs Lab 10/31/13 1509 10/31/13 1607 10/31/13 1745  PHART 7.383 7.402 7.324*  PCO2ART 40.7 39.5 46.1*  PO2ART 176.0* 209.0* 63.0*   Liver Enzymes  Recent Labs Lab 10/30/13 1407  AST 34  ALT 29  ALKPHOS 154*  BILITOT 1.0  ALBUMIN 3.4*   Cardiac Enzymes No results found for this basename: TROPONINI, PROBNP,  in the last 168 hours Glucose No results found for this basename: GLUCAP,  in the last 168 hours  Imaging Dg Chest 2 View  10/30/2013   CLINICAL DATA:   Preoperative films for patient with an abdominal aortic aneurysm.  EXAM: CHEST  2 VIEW  COMPARISON:  CT chest 07/17/2013.  FINDINGS: The lungs are emphysematous with scarring in the right upper lobe, unchanged. No consolidative process, pneumothorax or effusion. Heart size is in place.  IMPRESSION: Emphysema without acute disease.   Electronically Signed   By: Inge Rise M.D.   On: 10/30/2013 16:14   Dg Chest Portable 1 View  10/31/2013   CLINICAL DATA:  Aortic aneurysm repair, postop  EXAM: PORTABLE CHEST - 1 VIEW  COMPARISON:  the previous day's study  FINDINGS: An endotracheal tube tip is at the thoracic inlet. Right IJ Swan-Ganz extends to the pulmonary outflow tract. Nasogastric tube at least as far as the stomach, tip not seen. Left subclavian port catheter to the proximal SVC. Linear scarring/ atelectasis at the right lung apex stable. Low lung volumes. Perihilar and bibasilar interstitial edema has increased. There is some patchy airspace consolidation at the left lung base. Possible small left effusion. .  IMPRESSION: 1. High position of endotracheal tube, tip of the thoracic inlet. 2. Increase in bibasilar interstitial edema or infiltrates with possible small left effusion.   Electronically Signed   By: Arne Cleveland M.D.   On: 10/31/2013 18:44   Dg Chest Port 1 View  10/31/2013   CLINICAL DATA:  Reposition endotracheal tube  EXAM: PORTABLE CHEST - 1 VIEW  COMPARISON:  Earlier film of the same day  FINDINGS: The endotracheal tube tip is now 5.1 cm above  carina. Nasogastric tube, right IJ Swan-Ganz catheter, and left subclavian port catheter stable in position. Hazy increase in opacity at the left lung base without obscuration of the lateral costophrenic angle suggesting layering effusion. Coarse linear scarring or atelectasis in the right lung apex stable. Bibasilar interstitial edema or infiltrates. Relatively low lung volumes. .  IMPRESSION: 1. Repositioned endotracheal tube, tip 5.1 cm above  carina. 2. Persistent bibasilar interstitial edema/infiltrates with possible layering left pleural effusion.   Electronically Signed   By: Arne Cleveland M.D.   On: 10/31/2013 18:36     CXR: bibasilar edema/atx, prob left base atx, lines/tubes ok, port  ASSESSMENT / PLAN:  PULMONARY A: Acute respiratory failure H/o COPD  H/o lung ca P:   Full vent support, maintain current MV F/u cxr, at risk for TRALI F/u abg in am   PAD protocol, may need to abort dex Scheduled BDs Wean fio2 for sats >92%. Allow pos balance, is on ,low O2  CARDIOVASCULAR A:  AAA repair  Post op Hypovolemic/ hemorrhagic  Shock R/o peri op eichemia P:  Cont volume resuscitation Follow PAD, may need to wedge cvp assessment  Neo for MAP >65 Holding dopamine with tachy Trop, ecg  RENAL A:  No acute: at risk for AKI  P:   Keep even vol status Avoid hypotension  Strict I&O F/u chem   GASTROINTESTINAL A:  Cirrhosis   P:   NPO  PPI for SUP See neuro Limit tylenal  HEMATOLOGIC A:   Acute blood loss anemia (post-op) Mild coagulopathy (in setting of underlying liver disease and multiple blood products) Thrombocytopenia (consumptive, dilution) H/o lung cancer (observation since May of 2013 with no evidence for disease progression)  >estimated 8.5 liters EBL. Received 6 FFP, 4 PRBC, 2 PLTs in addition to cell savor as well as crystalloid and colloid replacement  >currently completing post-op blood products ordered by surgery  P: -F/u cbc and coags after transfusion complete -Defer transfusion to surgery for now, but should transition to more conservative transfusion strategies -Hold heparin  -fibrinogen   INFECTIOUS A:  No acute  P:   Trend fever and WBC curve   ENDOCRINE A:   Hypothyroidism Mild hyperglycemia  P:   Synthroid replacement  ssi   NEUROLOGIC A:  Post-op pain, HIGH risk hepatic encephalopathy with blood admin P:   Supportive care  PAD protocol  No role ammonia,  may need early lactulose when gut OK to use Failing precedex, add fent drip  TODAY'S SUMMARY: post-op AAA repair. Getting blood currently. Should be able to wean pressors. Hope to wean FIO2 for sats >92%. He is at great risk for TRALI. Add fent drip, dc precedex  I have personally obtained a history, examined the patient, evaluated laboratory and imaging results, formulated the assessment and plan and placed orders. CRITICAL CARE: The patient is critically ill with multiple organ systems failure and requires high complexity decision making for assessment and support, frequent evaluation and titration of therapies, application of advanced monitoring technologies and extensive interpretation of multiple databases. Critical Care Time devoted to patient care services described in this note is 30 minutes.   Lavon Paganini. Titus Mould, MD, [place] Pgr: [facility]  Pulmonary and [place] Pager: [phone]  10/31/2013, 8:14 PM

## 2013-10-31 NOTE — H&P (Signed)
[facility] [place]  HISTORY AND PHYSICAL  History of Present Illness: Patient is a 73 y.o. year old male who presents for evaluation of abdominal aortic aneurysm. Patient returns today after recent CT scan of the abdomen and pelvis did discuss an intervention The patient denies abdominal pain. The patient denies back pain. The patient has family history of AAA in his brother. Other medical problems include stage 3 lung cancer dormant status post chemo radiation. 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 He denies significant cardiac history. He is also being considered for left knee replacement by Dr. Durward Fortes. Other chronic medical problems include hyperlipidemia, COPD, possible cirrhosis by CT scan. All these are currently stable.   Past Medical History   Diagnosis  Date   .  History of colon polyps  2006,2008   .  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  Hyperlipidemia      takes Pravastatin   .  Emphysema    .  Lung cancer      right upper lobe   .  Enlarged prostate      takes Rapaflo daily   .  Arthritis      left knee and back arthrtis   .  Hypothyroidism      takes Synthroid daily   .  Cataract    .  Hx of radiation therapy  09/02/11 to 10/19/11     chest   .  Diverticulosis    .  Cirrhosis      By radiography, seen on CT   .  Rosacea     Past Surgical History   Procedure  Date   .  Hernia repair  70's   .  Vasectomy  70's   .  Finger surgery  2008     left pointer finger   .  Knee arthroscopy  2008     left knee   .  Tonsillectomy      as a child   .  Colonoscopy  2008   .  Cardiac catheterization  2003   .  Esophagogastroduodenoscopy    .  Portacath placement  08/10/2011     Procedure: INSERTION PORT-A-CATH; Surgeon: Pierre Bali, MD; Location: [facility] OR; Service: Thoracic; Laterality: Left; Left Subclavian   Social History  History    Substance Use Topics   .  Smoking status:  Former Smoker -- 1.5 packs/day  for 40 years     Types:  Cigarettes     Quit date:  07/02/2011   .  Smokeless tobacco:  Former Systems developer   .  Alcohol Use:  Yes      social   Family History  Family History   Problem  Relation  Age of Onset   .  Anesthesia problems  Neg Hx    .  Hypotension  Neg Hx    .  Malignant hyperthermia  Neg Hx    .  Pseudochol deficiency  Neg Hx    .  Cancer  Father    .  Diabetes  Father    .  Hyperlipidemia  Brother    .  Aneurysm  Brother       Abdominal aortic aneurysm   Allergies  No Known Allergies  Current Outpatient Prescriptions on File Prior to Visit   Medication  Sig  Dispense  Refill   .  Misc Natural Products (PROSTATE HEALTH) CAPS  Take by mouth.     .  Multiple Vitamins-Minerals (MULTIVITAMINS THER. W/MINERALS) TABS  Take 1 tablet by mouth daily.     Marland Kitchen  SYNTHROID 175 MCG tablet  Take 175 mcg by mouth daily.     .  tamsulosin (FLOMAX) 0.4 MG CAPS  Take 0.4 mg by mouth daily.     Marland Kitchen  tiotropium (SPIRIVA) 18 MCG inhalation capsule  Place 18 mcg into inhaler and inhale daily.     Marland Kitchen  FINACEA 15 % cream  Apply 1 application topically as directed.     Marland Kitchen  HYDROcodone-acetaminophen (NORCO) 5-325 MG per tablet  Take 1 tablet by mouth every 6 (six) hours as needed for pain.  30 tablet  0   .  lidocaine-prilocaine (EMLA) cream  Apply 1 application topically as needed.     .  nystatin-triamcinolone (MYCOLOG II) cream  Apply 1 application topically daily as needed.      No current facility-administered medications on file prior to visit.   ROS:  General: No weight loss, Fever, chills, has general fatigue  HEENT: No recent headaches, no nasal bleeding, no visual changes, no sore throat  Neurologic: No dizziness, blackouts, seizures. No recent symptoms of stroke or mini- stroke. No recent episodes of slurred speech, or temporary blindness.  Cardiac: No recent episodes of chest pain/pressure, no shortness of breath at rest. Has shortness of breath with exertion. Denies history of atrial  fibrillation or irregular heartbeat  Vascular: No history of rest pain in feet. No history of claudication. No history of non-healing ulcer, No history of DVT  Pulmonary: No home oxygen, chronic cough, no hemoptysis, No asthma or wheezing  Musculoskeletal: [x ] Arthritis, [x ] Low back pain, [x ] Joint pain  Hematologic:No history of hypercoagulable state. No history of easy bleeding. No history of anemia  Gastrointestinal: No hematochezia or melena, No gastroesophageal reflux, no trouble swallowing  Urinary: [ ]  chronic Kidney disease, [ ]  on HD - [ ]  MWF or [ ]  TTHS, [ ]  Burning with urination, [ ]  Frequent urination, [ ]  Difficulty urinating;  Skin: No rashes  Psychological: No history of anxiety, No history of depression   Physical Examination  Filed Vitals:   10/31/13 0559  BP: 119/88  Pulse: 91  Temp: 97.8 F (36.6 C)  TempSrc: Oral  Resp: 20  SpO2: 93%    General: Alert and oriented, no acute distress  HEENT: Normal  Neck: No bruit or JVD  Pulmonary: Clear to auscultation bilaterally  Cardiac: Regular Rate and Rhythm without murmur  Gastrointestinal: Soft, non-tender, non-distended, vaguely palpable pulsatile epigastric mass  Skin: No rash  Extremity Pulses: 2+ radial, brachial, femoral, dorsalis pedis, posterior tibial pulses bilaterally  Musculoskeletal: No deformity or edema  Neurologic: Upper and lower extremity motor 5/5 and symmetric   Data: CT scan of abdomen and pelvis is reviewed today. Infrarenal abdominal aortic aneurysm has enlarged since 2013.  Aneurysm now measures up to 6.2 cm. Aneurysm extends into the common iliac arteries bilaterally. Enlargement of the common iliac arteries  bilaterally. Left common iliac artery aneurysm measures up to 5.1 cm and the right measures 3.1 cm. 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   ASSESSMENT: Patient with large abdominal and iliac artery aneurysms. Stage III lung cancer  currently dormant. I discussed with the patient today the procedural details of  open aneurysm repair. There is some low but present risk of colon infarction as well  as buttock claudication sexual dysfunction with this. I also discussed with the patient that open aneurysm repair would be a much larger operation with risks of transfusion, and high risk of pulmonary events. He will also need preoperative cardiac risk stratification. All these will be set up in the next few weeks.   Ruta Hinds, MD  [facility] [place]  Office: [phone]  Pager: [phone]

## 2013-10-31 NOTE — Anesthesia Postprocedure Evaluation (Signed)
  Anesthesia Post-op Note  Patient: Joseph [name]  Procedure(s) Performed: Procedure(s): aorto to left external iliac and right external iliac and left internal iliac, reimplantation of inferior mesenteric artery and ligation of left iliac artery aneursym (N/A) THROMBECTOMY ILIAC ARTERY (Bilateral)  Patient Location: PACU  Anesthesia Type:General  Level of Consciousness: awake  Airway and Oxygen Therapy: Patient Spontanous Breathing  Post-op Pain: mild  Post-op Assessment: Post-op Vital signs reviewed  Post-op Vital Signs: Reviewed  Complications: No apparent anesthesia complications

## 2013-10-31 NOTE — Op Note (Signed)
Procedure: Aorto to left and right external iliac bypass, left internal iliac artery bypass, ligation right internal iliac aneurysm, reimplantation of inferior mesenteric artery. Thrombectomy of left and right iliac and femoral arteries  Preop: Aortic, bilateral common iliac, bilateral internal iliac artery aneurysms  Postop: same  Anesthesia: General  Asst: Adele Barthel MD, Gerri Lins, PA-C  Findings: 20 x 10 mm Dacron graft infrarenal aorta to bilateral external iliac artery, 10 mm dacron graft from left iliac limb to left internal iliac artery, inferior mesenteric artery reimplanted to main body of graft. Nodular liver  Operative details:  After obtaining informed consent, the patient was taken to the operating room. The patient was placed in supine position on the operating room table. After induction of general anesthesia and endotracheal intubation, a Foley catheter was placed. A nasogastric tube was also placed. The patient was then prepped and draped in usual sterile fashion from the nipples to the knees. A midline laparotomy incision was made extending from the xiphoid to the pubis. The incision was carried down through the subcutaneous tissues down to level of the fascia. The fascia and peritoneum were incised for the full length the incision. Upon entering the peritoneal cavity the left lateral segment of the liver was noted to be fairly nodular. The remainder of the liver was nodular as well. Otherwise there was no obvious significant other intra-abdominal pathology. Small bowel was reflected to the right. The transverse colon was reflected superiorly. The retroperitoneum was entered. There was a 6 cm infrarenal abdominal aortic aneurysm. Retroperitoneum was incised all the way up to the level left renal vein. The left renal vein was retracted superiorly. An Omni retractor was then brought in operative field to assist in retraction. A suitable spot for clamping was made in the infrarenal  aorta just below the level of the renal arteries. The inferior mesenteric artery was identified dissected free circumferentially a vessel loop placed around this. This vessel was approximately 2-1/2 mm in diameter. Dissection was then carried down the right iliac artery. There was a large right common iliac artery aneurysm 4-5 cm in diameter. I stayed on the anterior surface of the aneurysm down to the iliac bifurcation. The right external iliac artery was dissected free circumferentially and a vessel loop was placed around this. The right external iliac artery was soft in character. The right ureter was identified and reflected laterally. There was a 3 cm right internal iliac artery aneurysm. The distal common iliac artery and the proximal aspect of the internal iliac aneurysm were densely calcified. I elevated the internal iliac artery aneurysm as much as possible in order to dissect free as many of its branches as possible. There were two 3 mm branches dissected free circumferentially and placed Vesseloops around these. Attention was then turned to the left iliac artery. In similar fashion dissection was carried along the anterior aspect of this. In order to get down to the left iliac bifurcation the sigmoid colon was reflected superiorly and the retroperitoneum was entered along the left lateral quadrant along the line of Toldt.  The left ureter was identified and reflected laterally. Dissection was carried down to the left iliac bifurcation. The left common iliac artery was much larger approximately 6 cm in diameter. This made dissection more difficult on the left side. The left external iliac artery was dissected free circumferentially. It was very tortuous. Several centimeters of this was freed up circumferentially to make it suitable for grafting. It was also soft and character. There was  a left internal iliac artery aneurysm which was 3-4 cm in diameter. This was at the proximal aspect of the internal iliac  artery. The internal iliac artery became more normal diameter just distal to this. I dissected the internal iliac artery circumferentially below the left internal iliac artery aneurysm and prepared it for clamping. A vessel loop was placed around this. At this point the patient was given 10,000 units of intravenous heparin. It should be noted that the patient was given an additional 10,000 units of heparin during the course of the case. The left and right external iliac arteries were controlled with Henley clamps. The left and right internal iliac arteries were also controlled with Henley clamps. The infrarenal abdominal aorta was then controlled with a aortic DeBakey clamp just below the level renal arteries. The aneurysm was then entered with cautery. A large amount of thrombus was evacuated. There were several large lumbar arteries back bleeding at the proximal aspect of the aneurysm. These were controlled with several figure-of-eight silk sutures. A neck was fashioned in the proximal aorta. A 20 x 10 mm Dacron graft was brought up in the operative field and this was an end-to-end to the proximal aorta using a running 3-0 Prolene suture. This was done in a running vertical mattress fashion incorporating the aneurysm wall as a pledget. At completion of the anastomosis the clamp was released and the anastomosis tested. There were still some bleeding from the posterior wall. This was repaired with 2 pledgeted 3-0 Prolene sutures. There was also one small area of bleeding on the anterior wall and this was also repaired with a pledgeted suture. There was still some backbleeding from the lumbar arteries and this was reinforced with additional silk suture. The infrarenal clamp was then again removed and the anastomosis was fairly hemostatic. This was packed with thrombin and Gelfoam. The left and right iliac limbs were controlled with peripheral DeBakey clamps. The left iliac limb was then brought down retrocolic  retro-ureteral to the left iliac bifurcation. The left common iliac artery aneurysm was opened longitudinally below the sigmoid colon. The left external iliac artery was transected. Graft was cut to length and then sewn to the external iliac artery using a running 5-0 Prolene suture. Just prior to completion of the anastomosis a #4 and #5 Fogarty catheter was passed down the left iliac system and the left leg. A large amount of thrombus was removed. There was then vigorous back bleeding. The #4 Fogarty catheter was passed 70 cm down the leg multiple passes until 2 clean passes were obtained. The remainder of the anastomosis was completed. Everything was forebled backbled and thoroughly flushed. The clamps were removed and there was good pulsatile flow in the distal external iliac artery immediately. There was a transient episode of hypotension after opening the clamps which quickly recovered. Attention was then turned to the right iliac system. The right common iliac artery was opened longitudinally and the thrombus removed. There was extensive involvement of the left internal iliac artery by aneurysm. I elected at this point to ligate the internal iliac artery aneurysm and the adjacent side branches of the left internal iliac artery. There was some bleeding during this portion of the case from backbleeding from internal iliac branches which was controlled with 2-0 silk figure-of-eight sutures. Attention was then turned to the right external iliac artery. This was transected and the right iliac limb graft brought down and cut to length. This was then sewn end to end to the right  external iliac artery using a running 5-0 Prolene suture. I also passed a #4 Fogarty catheter down the right iliac system and 2 clean passes were obtained no thrombus was retrieved. Just prior to completion of the anastomosis this was forebled backbled and thoroughly flushed. Anastomosis was secured clamps released there was again a  transient blood pressure drop which quickly recovered. This limb was also retroureteral.  Attention was then turned back to the left iliac system. I noticed there was significant redundancy of the left iliac limb. I decided that this would need to be shortened. Therefore the left iliac limb was clamped proximally and distally with peripheral DeBakey clamps. I excised approximately 4 cm the graft. The graft was then reapproximated graft to graft just above the previous anastomosis using a running 5-0 Prolene suture. Just prior to completion of this it was forebled backbled and thoroughly flushed and the anastomosis was secured. Both of these iliac anastomoses were then packed with laparotomy pads. Attention was then turned back to the infrarenal aorta. There were still some oozing from the posterior wall. An additional 2 pledgeted 3-0 Prolene sutures were placed. An additional silk suture was also placed at the previous area of lumbar bleeding. At this point the inferior mesenteric artery was inspected. There was some backbleeding from this. However I did not feel with possibly losing both internal arteries we should abandon the inferior mesenteric artery. Therefore a side-biting Satinsky clamp was placed in the main body of the aortic graft and a small hole made with 11 blade followed by 5 punch. The inferior mesenteric artery was fashioned into a Carrell patch. This was then sewn onto the anterior wall of the abdominal aortic graft using a running 6-0 Prolene suture. Just prior completion of this anastomosis was forebled backbled and thoroughly flushed the anastomosis was secured.  The Satinsky clamp was removed and there was good Doppler flow the inferior mesenteric artery immediately. This was then packed with laparotomy pads. Attention was then turned back to the left pelvis. The remainder of the left internal iliac artery aneurysm was opened longitudinally down to a more normal segment internal iliac artery. A  10 mm Dacron graft was brought up in the operative field and sewn end-to-end to the distal native left internal artery using a running 5-0 Prolene suture. At completion of this the 10 mm graft was cut to length for anastomosis to the left external iliac limb of the graft. The left external iliac limb of the graft was controlled proximally and distally with peripheral DeBakey clamps. A longitudinal opening was made in the medial aspect of the left external iliac limb. The graft was then sewn end of graft to side graft using a running 5-0 Prolene suture. Just prior to completion of the anastomosis it was forebled backbled and thoroughly flushed.  The anastomosis was secured and clamps were released and there was good pulsatile flow in the external and internal iliac arteries immediately. The feet were inspected and found to be pink with palpable pulses. At this point the patient was given 200 mg protamine. All the anastomoses were inspected and found to be fairly hemostatic.  However there was some diffuse oozing consistent with coagulopathy. The patient was given extra fresh frozen plasma and platelets. I then began to close the retroperitoneal structures. The left iliac aneurysm was closed over the left limb of the graft as well as the internal iliac graft the infrarenal abdominal aorta was also reapproximated over the existing graft. All this was  done with a running 3-0 Vicryl suture. The right iliac system was also closed over the right iliac graft and there was no exposed graft and the retroperitoneum was completely closed. There is still some retroperitoneal oozing and this is controlled with laparotomy pads. After administration of more fresh frozen plasma hemostasis was fairly reasonable at this point. I then reapproximated the fascia after removing all of the Omni retractor. Small bowel and colon were inspected and found to be pink and viable. The fascia was reapproximated using a running #1 PDS suture. The  skin was reapproximated using staples. The instrument sponge and needle counts were correct at the end of the case. The patient was taken to the intensive care unit in critical but stable condition.  Ruta Hinds, MD [facility] Office: [phone] Pager: [phone]

## 2013-10-31 NOTE — Progress Notes (Signed)
eLink Physician-Brief Progress Note Patient Name: Joseph [name] DOB: 11/19/1940 MRN: [id number]  Date of Service  10/31/2013   HPI/Events of Note   ET tube too high and needs vent orders  eICU Interventions  Placed and move ET tube down 4.   Intervention Category Major Interventions: Acid-Base disturbance - evaluation and management;Respiratory failure - evaluation and management  YACOUB,WESAM 10/31/2013, 6:16 PM

## 2013-10-31 NOTE — Progress Notes (Addendum)
Dr Oneida Alar updated pt status at bedside. MD updated current neo gtt rate, MD ok to continue neo gtt, do not use dopamine, pt ST 110s. MD ordered 4 FFP, 1 platelets. MD stated to not check CBC or PT until post blood product infusion. MD stated to hold PRBC post op until CBC/PT check post FFP/platelet transfusion. Pt on precedex gtt at 0.45mcg. Plan for pt to stay intubated over night. BMP/Mag pending. Updated ABG results. CCM consulted. Will continue to monitor. Reather Laurence

## 2013-10-31 NOTE — Progress Notes (Signed)
eLink Physician-Brief Progress Note Patient Name: Joseph [name] DOB: 09-17-1940 MRN: [id number]  Date of Service  10/31/2013   HPI/Events of Note   Post-op sedation.  eICU Interventions  Precedex ordered.   Intervention Category Major Interventions: Other:  [name] 10/31/2013, 7:24 PM

## 2013-10-31 NOTE — Anesthesia Preprocedure Evaluation (Signed)
Anesthesia Evaluation  Patient identified by MRN, date of birth, ID band Patient awake    Reviewed: Allergy & Precautions, H&P , NPO status , Patient's Chart, lab work & pertinent test results  Airway Mallampati: II  Neck ROM: full    Dental   Pulmonary shortness of breath, COPDformer smoker,          Cardiovascular + Peripheral Vascular Disease     Neuro/Psych    GI/Hepatic   Endo/Other  Hypothyroidism   Renal/GU      Musculoskeletal  (+) Arthritis -,   Abdominal   Peds  Hematology   Anesthesia Other Findings   Reproductive/Obstetrics                           Anesthesia Physical Anesthesia Plan  ASA: III  Anesthesia Plan: General   Post-op Pain Management:    Induction: Intravenous  Airway Management Planned: Oral ETT  Additional Equipment: Arterial line, CVP, PA Cath and Ultrasound Guidance Line Placement  Intra-op Plan:   Post-operative Plan: Extubation in OR and Possible Post-op intubation/ventilation  Informed Consent: I have reviewed the patients History and Physical, chart, labs and discussed the procedure including the risks, benefits and alternatives for the proposed anesthesia with the patient or authorized representative who has indicated his/her understanding and acceptance.     Plan Discussed with: CRNA, Anesthesiologist and Surgeon  Anesthesia Plan Comments:         Anesthesia Quick Evaluation

## 2013-10-31 NOTE — Progress Notes (Signed)
CRITICAL VALUE ALERT  Critical value received:  Hbg 6.2   Date of notification:  10-31-13  Time of notification:  2330  Critical value read back: yes   Nurse who received alert:  A. Darcel Smalling RN  MD notified (1st page):  Dr. Trula Slade   Time of first page:  2355  MD notified (2nd page):  Time of second page:  Responding MD:  Dr/ Trula Slade  Time MD responded:  2355  Notified of coag and fibrinogen lab results. Made aware occasional bursts of bigemeny (K 3.9.)  HR 120s-130s, BP 70s-110s on 161mcg of Neo. Order received to transfuse 2 units PRBC. Will continue to closely monitor. Richarda Blade RN

## 2013-10-31 NOTE — Transfer of Care (Signed)
Immediate Anesthesia Transfer of Care Note  Patient: Joseph [name]  Procedure(s) Performed: Procedure(s): aorto to left external iliac and right external iliac and left internal iliac, reimplantation of inferior mesenteric artery and ligation of left iliac artery aneursym (N/A) THROMBECTOMY ILIAC ARTERY (Bilateral)  Patient Location: SICU  Anesthesia Type:General  Level of Consciousness: sedated, unresponsive and Patient remains intubated per anesthesia plan  Airway & Oxygen Therapy: Patient remains intubated per anesthesia plan and Patient placed on Ventilator (see vital sign flow sheet for setting)  Post-op Assessment: Report given to PACU RN and Post -op Vital signs reviewed and stable  Post vital signs: Reviewed and stable  Complications: No apparent anesthesia complications

## 2013-11-01 ENCOUNTER — Inpatient Hospital Stay (HOSPITAL_COMMUNITY): Payer: Medicare Other

## 2013-11-01 ENCOUNTER — Encounter (HOSPITAL_COMMUNITY): Payer: Self-pay | Admitting: Vascular Surgery

## 2013-11-01 DIAGNOSIS — R579 Shock, unspecified: Secondary | ICD-10-CM

## 2013-11-01 DIAGNOSIS — I714 Abdominal aortic aneurysm, without rupture, unspecified: Secondary | ICD-10-CM

## 2013-11-01 DIAGNOSIS — J9600 Acute respiratory failure, unspecified whether with hypoxia or hypercapnia: Secondary | ICD-10-CM

## 2013-11-01 DIAGNOSIS — D62 Acute posthemorrhagic anemia: Secondary | ICD-10-CM

## 2013-11-01 DIAGNOSIS — I2510 Atherosclerotic heart disease of native coronary artery without angina pectoris: Secondary | ICD-10-CM

## 2013-11-01 DIAGNOSIS — K7460 Unspecified cirrhosis of liver: Secondary | ICD-10-CM

## 2013-11-01 LAB — BLOOD GAS, ARTERIAL
ACID-BASE EXCESS: 1.1 mmol/L (ref 0.0–2.0)
Acid-Base Excess: 0.5 mmol/L (ref 0.0–2.0)
Acid-Base Excess: 3.1 mmol/L — ABNORMAL HIGH (ref 0.0–2.0)
Bicarbonate: 24.5 mEq/L — ABNORMAL HIGH (ref 20.0–24.0)
Bicarbonate: 26.8 mEq/L — ABNORMAL HIGH (ref 20.0–24.0)
Bicarbonate: 26 mEq/L — ABNORMAL HIGH (ref 20.0–24.0)
Drawn by: 36496
Drawn by: 331001
Drawn by: 347621
FIO2: 0.6 %
FIO2: 1 %
FIO2: 1 %
O2 Saturation: 98 %
O2 Saturation: 97.4 %
O2 Saturation: 95.5 %
PATIENT TEMPERATURE: 98.6
PCO2 ART: 39.9 mmHg (ref 35.0–45.0)
PCO2 ART: 38 mmHg (ref 35.0–45.0)
PEEP: 5 cmH2O
PEEP: 5 cmH2O
PH ART: 7.461 — AB (ref 7.350–7.450)
PO2 ART: 90.4 mmHg (ref 80.0–100.0)
Patient temperature: 100
Patient temperature: 98.6
RATE: 16 resp/min
RATE: 14 resp/min
TCO2: 25.7 mmol/L (ref 0–100)
TCO2: 27.9 mmol/L (ref 0–100)
TCO2: 27.5 mmol/L (ref 0–100)
VT: 620 mL
VT: 620 mL
pCO2 arterial: 47.8 mmHg — ABNORMAL HIGH (ref 35.0–45.0)
pH, Arterial: 7.409 (ref 7.350–7.450)
pH, Arterial: 7.355 (ref 7.350–7.450)
pO2, Arterial: 80.6 mmHg (ref 80.0–100.0)
pO2, Arterial: 82.4 mmHg (ref 80.0–100.0)

## 2013-11-01 LAB — PREPARE FRESH FROZEN PLASMA
UNIT DIVISION: 0
UNIT DIVISION: 0
UNIT DIVISION: 0
UNIT DIVISION: 0
UNIT DIVISION: 0
Unit division: 0
Unit division: 0
Unit division: 0
Unit division: 0
Unit division: 0

## 2013-11-01 LAB — DIC (DISSEMINATED INTRAVASCULAR COAGULATION)PANEL
D-Dimer, Quant: 2.25 ug/mL-FEU — ABNORMAL HIGH (ref 0.00–0.48)
Fibrinogen: 309 mg/dL (ref 204–475)
INR: 1.29 (ref 0.00–1.49)
Platelets: 83 10*3/uL — ABNORMAL LOW (ref 150–400)
aPTT: 33 seconds (ref 24–37)

## 2013-11-01 LAB — BASIC METABOLIC PANEL
BUN: 13 mg/dL (ref 6–23)
CHLORIDE: 102 meq/L (ref 96–112)
CO2: 25 mEq/L (ref 19–32)
Calcium: 7.5 mg/dL — ABNORMAL LOW (ref 8.4–10.5)
Creatinine, Ser: 0.68 mg/dL (ref 0.50–1.35)
GFR calc Af Amer: >90 mL/min (ref >90)
GFR calc non Af Amer: >90 mL/min (ref >90)
GLUCOSE: 112 mg/dL — AB (ref 70–99)
POTASSIUM: 3.3 meq/L — AB (ref 3.7–5.3)
Sodium: 139 mEq/L (ref 137–147)

## 2013-11-01 LAB — PREPARE PLATELET PHERESIS
UNIT DIVISION: 0
Unit division: 0
Unit division: 0

## 2013-11-01 LAB — CBC
HCT: 22 % — ABNORMAL LOW (ref 39.0–52.0)
HCT: 23.8 % — ABNORMAL LOW (ref 39.0–52.0)
HEMATOCRIT: 23.8 % — AB (ref 39.0–52.0)
HEMATOCRIT: 27.6 % — AB (ref 39.0–52.0)
HEMOGLOBIN: 8.5 g/dL — AB (ref 13.0–17.0)
HEMOGLOBIN: 9.8 g/dL — AB (ref 13.0–17.0)
Hemoglobin: 8 g/dL — ABNORMAL LOW (ref 13.0–17.0)
Hemoglobin: 8.5 g/dL — ABNORMAL LOW (ref 13.0–17.0)
MCH: 33.5 pg (ref 26.0–34.0)
MCH: 32.3 pg (ref 26.0–34.0)
MCH: 32.3 pg (ref 26.0–34.0)
MCH: 32.1 pg (ref 26.0–34.0)
MCHC: 36.4 g/dL — AB (ref 30.0–36.0)
MCHC: 35.7 g/dL (ref 30.0–36.0)
MCHC: 35.7 g/dL (ref 30.0–36.0)
MCHC: 35.5 g/dL (ref 30.0–36.0)
MCV: 92.1 fL (ref 78.0–100.0)
MCV: 90.5 fL (ref 78.0–100.0)
MCV: 90.5 fL (ref 78.0–100.0)
MCV: 90.5 fL (ref 78.0–100.0)
Platelets: 100 10*3/uL — ABNORMAL LOW (ref 150–400)
Platelets: 93 10*3/uL — ABNORMAL LOW (ref 150–400)
Platelets: 83 10*3/uL — ABNORMAL LOW (ref 150–400)
Platelets: 130 10*3/uL — ABNORMAL LOW (ref 150–400)
RBC: 2.39 MIL/uL — ABNORMAL LOW (ref 4.22–5.81)
RBC: 2.63 MIL/uL — ABNORMAL LOW (ref 4.22–5.81)
RBC: 2.63 MIL/uL — ABNORMAL LOW (ref 4.22–5.81)
RBC: 3.05 MIL/uL — ABNORMAL LOW (ref 4.22–5.81)
RDW: 14.6 % (ref 11.5–15.5)
RDW: 14.8 % (ref 11.5–15.5)
RDW: 15 % (ref 11.5–15.5)
RDW: 15.2 % (ref 11.5–15.5)
WBC: 6.3 10*3/uL (ref 4.0–10.5)
WBC: 8.8 10*3/uL (ref 4.0–10.5)
WBC: 7.1 10*3/uL (ref 4.0–10.5)
WBC: 14 10*3/uL — ABNORMAL HIGH (ref 4.0–10.5)

## 2013-11-01 LAB — GLUCOSE, CAPILLARY
GLUCOSE-CAPILLARY: 127 mg/dL — AB (ref 70–99)
GLUCOSE-CAPILLARY: 118 mg/dL — AB (ref 70–99)
GLUCOSE-CAPILLARY: 134 mg/dL — AB (ref 70–99)
Glucose-Capillary: 151 mg/dL — ABNORMAL HIGH (ref 70–99)
Glucose-Capillary: 169 mg/dL — ABNORMAL HIGH (ref 70–99)

## 2013-11-01 LAB — COMPREHENSIVE METABOLIC PANEL
ALBUMIN: 2.6 g/dL — AB (ref 3.5–5.2)
ALK PHOS: 44 U/L (ref 39–117)
ALT: 13 U/L (ref 0–53)
AST: 29 U/L (ref 0–37)
BUN: 14 mg/dL (ref 6–23)
CO2: 23 mEq/L (ref 19–32)
Calcium: 7.3 mg/dL — ABNORMAL LOW (ref 8.4–10.5)
Chloride: 104 mEq/L (ref 96–112)
Creatinine, Ser: 0.77 mg/dL (ref 0.50–1.35)
GFR calc Af Amer: >90 mL/min (ref >90)
GFR calc non Af Amer: 90 mL/min — ABNORMAL LOW (ref >90)
Glucose, Bld: 161 mg/dL — ABNORMAL HIGH (ref 70–99)
POTASSIUM: 3.9 meq/L (ref 3.7–5.3)
Sodium: 141 mEq/L (ref 137–147)
TOTAL PROTEIN: 4.8 g/dL — AB (ref 6.0–8.3)
Total Bilirubin: 1.9 mg/dL — ABNORMAL HIGH (ref 0.3–1.2)

## 2013-11-01 LAB — TROPONIN I
Troponin I: <0.3 ng/mL (ref <0.30)
Troponin I: <0.3 ng/mL (ref <0.30)

## 2013-11-01 LAB — DIC (DISSEMINATED INTRAVASCULAR COAGULATION) PANEL
PROTHROMBIN TIME: 15.8 s — AB (ref 11.6–15.2)
Smear Review: NONE SEEN

## 2013-11-01 LAB — CALCIUM, IONIZED: Calcium, Ion: 1.02 mmol/L — ABNORMAL LOW (ref 1.13–1.30)

## 2013-11-01 LAB — AMYLASE: Amylase: 115 U/L — ABNORMAL HIGH (ref 0–105)

## 2013-11-01 LAB — PROTIME-INR
INR: 1.37 (ref 0.00–1.49)
Prothrombin Time: 16.5 seconds — ABNORMAL HIGH (ref 11.6–15.2)

## 2013-11-01 LAB — MAGNESIUM
MAGNESIUM: 1.5 mg/dL (ref 1.5–2.5)
Magnesium: 1.4 mg/dL — ABNORMAL LOW (ref 1.5–2.5)

## 2013-11-01 LAB — PREPARE RBC (CROSSMATCH)

## 2013-11-01 LAB — APTT: aPTT: 30 seconds (ref 24–37)

## 2013-11-01 MED ORDER — MAGNESIUM SULFATE 40 MG/ML IJ SOLN
2.0000 g | Freq: Once | INTRAMUSCULAR | Status: AC
  Administered 2013-11-01: 2 g via INTRAVENOUS
  Filled 2013-11-01: qty 50

## 2013-11-01 MED ORDER — FENTANYL BOLUS VIA INFUSION
25.0000 ug | INTRAVENOUS | Status: DC | PRN
  Filled 2013-11-01: qty 50

## 2013-11-01 MED ORDER — FENTANYL CITRATE 0.05 MG/ML IJ SOLN
200.0000 ug | Freq: Once | INTRAMUSCULAR | Status: AC
  Administered 2013-11-01: 200 ug via INTRAVENOUS

## 2013-11-01 MED ORDER — FENTANYL CITRATE 0.05 MG/ML IJ SOLN: 50.0000 ug | Freq: Once | INTRAMUSCULAR | Status: DC

## 2013-11-01 MED ORDER — LEVOTHYROXINE SODIUM 100 MCG IV SOLR
87.5000 ug | Freq: Every day | INTRAVENOUS | Status: DC
  Administered 2013-11-01 – 2013-11-06 (×6): 87.5 ug via INTRAVENOUS
  Filled 2013-11-01 (×7): qty 5

## 2013-11-01 MED ORDER — PIPERACILLIN-TAZOBACTAM 3.375 G IVPB 30 MIN
3.3750 g | Freq: Once | INTRAVENOUS | Status: AC
  Administered 2013-11-02: 3.375 g via INTRAVENOUS
  Filled 2013-11-01: qty 50

## 2013-11-01 MED ORDER — ACETAMINOPHEN 10 MG/ML IV SOLN
1000.0000 mg | Freq: Once | INTRAVENOUS | Status: AC
  Administered 2013-11-01: 1000 mg via INTRAVENOUS
  Filled 2013-11-01: qty 100

## 2013-11-01 MED ORDER — MIDAZOLAM HCL 2 MG/2ML IJ SOLN
4.0000 mg | Freq: Once | INTRAMUSCULAR | Status: AC
  Administered 2013-11-01: 4 mg via INTRAVENOUS

## 2013-11-01 MED ORDER — POTASSIUM CHLORIDE 10 MEQ/50ML IV SOLN
10.0000 meq | INTRAVENOUS | Status: AC
  Administered 2013-11-01 – 2013-11-02 (×4): 10 meq via INTRAVENOUS
  Filled 2013-11-01 (×4): qty 50

## 2013-11-01 MED ORDER — AMIODARONE HCL IN DEXTROSE 360-4.14 MG/200ML-% IV SOLN
60.0000 mg/h | INTRAVENOUS | Status: AC
  Administered 2013-11-01 (×2): 60 mg/h via INTRAVENOUS
  Filled 2013-11-01 (×2): qty 200

## 2013-11-01 MED ORDER — AMIODARONE LOAD VIA INFUSION
150.0000 mg | Freq: Once | INTRAVENOUS | Status: AC
  Administered 2013-11-01: 150 mg via INTRAVENOUS
  Filled 2013-11-01: qty 83.34

## 2013-11-01 MED ORDER — VASOPRESSIN 20 UNIT/ML IJ SOLN
0.0300 [IU]/min | INTRAMUSCULAR | Status: DC
  Administered 2013-11-01: 0.03 [IU]/min via INTRAVENOUS
  Filled 2013-11-01 (×3): qty 2.5

## 2013-11-01 MED ORDER — DEXTROSE 5 % IV SOLN
5.0000 mg/h | INTRAVENOUS | Status: DC
  Administered 2013-11-01: 10 mg/h via INTRAVENOUS
  Filled 2013-11-01: qty 100

## 2013-11-01 MED ORDER — DILTIAZEM LOAD VIA INFUSION
10.0000 mg | Freq: Once | INTRAVENOUS | Status: AC
  Administered 2013-11-01: 10 mg via INTRAVENOUS
  Filled 2013-11-01: qty 10

## 2013-11-01 MED ORDER — ETOMIDATE 2 MG/ML IV SOLN
20.0000 mg | Freq: Once | INTRAVENOUS | Status: AC
  Administered 2013-11-01: 20 mg via INTRAVENOUS

## 2013-11-01 MED ORDER — SODIUM CHLORIDE 0.9 % IV SOLN
0.0000 ug/h | INTRAVENOUS | Status: DC
  Administered 2013-11-01: 50 ug/h via INTRAVENOUS
  Filled 2013-11-01: qty 50

## 2013-11-01 MED ORDER — AMIODARONE HCL IN DEXTROSE 360-4.14 MG/200ML-% IV SOLN
30.0000 mg/h | INTRAVENOUS | Status: DC
  Administered 2013-11-02 – 2013-11-07 (×9): 30 mg/h via INTRAVENOUS
  Filled 2013-11-01 (×21): qty 200

## 2013-11-01 MED ORDER — ROCURONIUM BROMIDE 50 MG/5ML IV SOLN
70.0000 mg | Freq: Once | INTRAVENOUS | Status: AC
  Administered 2013-11-01: 70 mg via INTRAVENOUS
  Filled 2013-11-01: qty 7

## 2013-11-01 MED ORDER — DEXTROSE 5 % IV SOLN: 5.0000 mg/h | INTRAVENOUS | Status: DC

## 2013-11-01 MED ORDER — FENTANYL CITRATE 0.05 MG/ML IJ SOLN
25.0000 ug | INTRAMUSCULAR | Status: DC | PRN
  Administered 2013-11-01 (×3): 100 ug via INTRAVENOUS
  Filled 2013-11-01 (×5): qty 2

## 2013-11-01 MED ORDER — FUROSEMIDE 10 MG/ML IJ SOLN
40.0000 mg | Freq: Once | INTRAMUSCULAR | Status: AC
  Administered 2013-11-01: 40 mg via INTRAVENOUS
  Filled 2013-11-01: qty 4

## 2013-11-01 MED ORDER — VANCOMYCIN HCL IN DEXTROSE 1-5 GM/200ML-% IV SOLN
1000.0000 mg | Freq: Three times a day (TID) | INTRAVENOUS | Status: DC
  Administered 2013-11-01 – 2013-11-03 (×7): 1000 mg via INTRAVENOUS
  Filled 2013-11-01 (×10): qty 200

## 2013-11-01 MED ORDER — MIDAZOLAM HCL 2 MG/2ML IJ SOLN
INTRAMUSCULAR | Status: AC
  Filled 2013-11-01: qty 4

## 2013-11-01 MED ORDER — PIPERACILLIN-TAZOBACTAM 3.375 G IVPB
3.3750 g | Freq: Three times a day (TID) | INTRAVENOUS | Status: DC
  Administered 2013-11-02 – 2013-11-05 (×10): 3.375 g via INTRAVENOUS
  Filled 2013-11-01 (×12): qty 50

## 2013-11-01 MED ORDER — HYDROCORTISONE NA SUCCINATE PF 100 MG IJ SOLR
50.0000 mg | Freq: Four times a day (QID) | INTRAMUSCULAR | Status: DC
  Administered 2013-11-01 – 2013-11-03 (×6): 50 mg via INTRAVENOUS
  Filled 2013-11-01 (×10): qty 1

## 2013-11-01 MED FILL — Heparin Sodium (Porcine) Inj 1000 Unit/ML: INTRAMUSCULAR | Qty: 30 | Status: AC

## 2013-11-01 MED FILL — Sodium Chloride IV Soln 0.9%: INTRAVENOUS | Qty: 2000 | Status: AC

## 2013-11-01 MED FILL — Sodium Chloride Irrigation Soln 0.9%: Qty: 3000 | Status: AC

## 2013-11-01 NOTE — Progress Notes (Signed)
  Amiodarone Drug - Drug Interaction Consult Note  Recommendations: Ok to continue amiodarone for now--currently on a diltiazem drip as well as being started on amio drip. Continue to watch QTc and heart rate.  If atorvastatin is restarted from home meds, patient is at increased risk of myopathy. Hypokalemia could result in cardiotoxicity if furosemide is resumed from home.  Amiodarone is metabolized by the cytochrome P450 system and therefore has the potential to cause many drug interactions. Amiodarone has an average plasma half-life of 50 days (range 20 to 100 days).   There is potential for drug interactions to occur several weeks or months after stopping treatment and the onset of drug interactions may be slow after initiating amiodarone.   []  Statins: Increased risk of myopathy. Simvastatin- restrict dose to 20mg  daily. Other statins: counsel patients to report any muscle pain or weakness immediately.  []  Anticoagulants: Amiodarone can increase anticoagulant effect. Consider warfarin dose reduction. Patients should be monitored closely and the dose of anticoagulant altered accordingly, remembering that amiodarone levels take several weeks to stabilize.  []  Antiepileptics: Amiodarone can increase plasma concentration of phenytoin, the dose should be reduced. Note that small changes in phenytoin dose can result in large changes in levels. Monitor patient and counsel on signs of toxicity.  []  Beta blockers: increased risk of bradycardia, AV block and myocardial depression. Sotalol - avoid concomitant use.  [x]   Calcium channel blockers (diltiazem and verapamil): increased risk of bradycardia, AV block and myocardial depression.  []   Cyclosporine: Amiodarone increases levels of cyclosporine. Reduced dose of cyclosporine is recommended.  []  Digoxin dose should be halved when amiodarone is started.  []  Diuretics: increased risk of cardiotoxicity if hypokalemia occurs.  []  Oral  hypoglycemic agents (glyburide, glipizide, glimepiride): increased risk of hypoglycemia. Patient's glucose levels should be monitored closely when initiating amiodarone therapy.   []  Drugs that prolong the QT interval:  Torsades de pointes risk may be increased with concurrent use - avoid if possible.  Monitor QTc, also keep magnesium/potassium WNL if concurrent therapy can't be avoided. Marland Kitchen Antibiotics: e.g. fluoroquinolones, erythromycin. . Antiarrhythmics: e.g. quinidine, procainamide, disopyramide, sotalol. . Antipsychotics: e.g. phenothiazines, haloperidol.  . Lithium, tricyclic antidepressants, and methadone.  Thank You,  Donyea Gafford  11/01/2013 9:29 PM

## 2013-11-01 NOTE — Progress Notes (Signed)
eLink Physician-Brief Progress Note Patient Name: Joseph [name] DOB: 1940/11/21 MRN: [id number]  Date of Service  11/01/2013   HPI/Events of Note   Pt with multiple transfusions.  Hypoxia.  CXR with interstitial edema.  BP better, and PAP 41/21.  eICU Interventions  Will give lasix 40 mg IV x one.   Intervention Category Major Interventions: Hypoxemia - evaluation and management  Athziry Millican 11/01/2013, 4:56 PM

## 2013-11-01 NOTE — Progress Notes (Signed)
ANTIBIOTIC CONSULT NOTE - INITIAL  Pharmacy Consult for vancomycin and Zosyn Indication: rule out sepsis  No Known Allergies  Patient Measurements: Height: 6' (182.9 cm) Weight: 216 lb 9 oz (98.232 kg) IBW/kg (Calculated) : 77.6   Vital Signs: Temp: 102.4 F (39.1 C) (03/04 2000) Temp src: Oral (03/04 2000) BP: 90/65 mmHg (03/04 2215) Pulse Rate: 137 (03/04 2215) Intake/Output from previous day: 03/03 0701 - 03/04 0700 In: 25618.6 [I.V.:13760.9; Blood:9927.7; NG/GT:30; IV Piggyback:1900] Out: 9860 [Urine:1360; Blood:8500] Intake/Output from this shift: Total I/O In: 341.7 [I.V.:241.7; IV Piggyback:100] Out: 375 [Urine:375]  Labs:  Recent Labs  10/31/13 2238  11/01/13 0345 11/01/13 1010 11/01/13 1705 11/01/13 1706 11/01/13 1810  WBC  --   < > 6.3 8.8 7.1  --   --   HGB  --   < > 8.0* 8.5* 8.5*  --   --   PLT  --   < > 100* 93* 83* 83*  --   CREATININE 0.74  --  0.77  --   --   --  0.68  < > = values in this interval not displayed. Estimated Creatinine Clearance: 104.3 ml/min (by C-G formula based on Cr of 0.68). No results found for this basename: VANCOTROUGH, Corlis Leak, VANCORANDOM, GENTTROUGH, GENTPEAK, GENTRANDOM, TOBRATROUGH, TOBRAPEAK, TOBRARND, AMIKACINPEAK, AMIKACINTROU, AMIKACIN,  in the last 72 hours   Microbiology: Recent Results (from the past 720 hour(s))  SURGICAL PCR SCREEN     Status: None   Collection Time    10/30/13  2:03 PM      Result Value Ref Range Status   MRSA, PCR NEGATIVE  NEGATIVE Final   Staphylococcus aureus NEGATIVE  NEGATIVE Final   Comment:            The Xpert SA Assay (FDA     approved for NASAL specimens     in patients over 71 years of age),     is one component of     a comprehensive surveillance     program.  Test performance has     been validated by [employer] for patients greater     than or equal to 71 year old.     It is not intended     to diagnose infection nor to     guide or monitor treatment.     Medical History: Past Medical History  Diagnosis Date  . History of colon polyps 2006,2008  . 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 Hyperlipidemia     takes Pravastatin  . Emphysema   . Lung cancer     right upper lobe  . Enlarged prostate     takes Rapaflo daily  . Arthritis     left knee and back arthrtis  . Hypothyroidism     takes Synthroid daily  . Cataract   . Hx of radiation therapy 09/02/11 to 10/19/11    chest  . Diverticulosis   . Cirrhosis     By radiography, seen on CT  . Rosacea   . CAD (coronary artery disease)   . Shortness of breath   . Heart murmur     teen    Assessment: 73 YOM admitted with AFib in RVR who has developed fever and worsening shock. To broaden antibiotics. SCr 0.7mg /dL with est CrCL >159mL/min. WBC 7.1 with Tmax 102.4. BP is soft. Blood and respiratory cultures have been sent. Has received Zinacef as post-vascular surgery orders, but no other antibiotics.  Goal  of Therapy:  Vancomycin trough level 15-20 mcg/ml  Plan:  1. Zosyn 3.375g IV q8h EI 2. Vancomycin 1000mg  IV q8h  3. Follow renal function, clinical progression, LOT and trough at [place] D. Shene Maxfield, PharmD, BCPS Clinical Pharmacist Pager: [phone] 11/01/2013 11:06 PM

## 2013-11-01 NOTE — Progress Notes (Signed)
Cardizem bolus had been give, drip was infusing and pt was not maintaining sats - 86% - on 100% NRB.  Requested ABG from Dr. Halford Chessman via elink button.

## 2013-11-01 NOTE — Progress Notes (Signed)
PT Cancellation Note  Patient Details Name: Joseph [name] MRN: [id number] DOB: 1941/07/22   Cancelled Treatment:    Reason Eval/Treat Not Completed: Medical issues which prohibited therapy. Pt extubated at 9:00 am. Remains on 90 mcg of neo-synephrine with HR 122 at rest. Will attempt PT evaluation 11/02/13 if medically appropriate.   Donnesha Karg 11/01/2013, 11:02 AM Pager [phone]

## 2013-11-01 NOTE — Progress Notes (Signed)
Dr. Trula Slade notified of HR 110s-120. SBP 80s-90s. Neo increased to 176mcg. Order for 1 unit PRBC at this time. After unit, will recheck CBC and coags. Richarda Blade RN

## 2013-11-01 NOTE — Progress Notes (Signed)
Contacted [facility] at 1445 to speak with a physician - no physician was present / advised a physician was to be arriving at 1500 and a message would be provided to call me.  Called back at 1515 and spoke with Dr. Halford Chessman advised him of the update I had provided Dr. Oneida Alar and that Dr. Oneida Alar had requested CCM to be notified.  Dr. Halford Chessman advised he would review and then place orders.

## 2013-11-01 NOTE — Progress Notes (Addendum)
I've seen and examined Mr. Frayre with Marni Griffon NP.  Agree with above.  Since I saw him earlier this evening he has developed a fever and worsening shock.  Agree with broadening Abx.  I placed CVL to measure CVP to help [place] with resuscitation.  Monitor UOP. Additional CC time 40 minutes Jillyn Hidden PCCM Pager: [phone] Cell: [phone] If no response, call [phone]

## 2013-11-01 NOTE — Progress Notes (Signed)
eLink Physician-Brief Progress Note Patient Name: Joseph [name] DOB: 24-Oct-1940 MRN: [id number]  Date of Service  11/01/2013   HPI/Events of Note   A fib with RVR  eICU Interventions  Start cardizem gtt.   Intervention Category Major Interventions: Arrhythmia - evaluation and management  Zohaib Heeney 11/01/2013, 6:01 PM

## 2013-11-01 NOTE — Progress Notes (Addendum)
[facility] PCCM  Called to bedside to assess for respiratory failure, afib with rvr, hypotension. Has required multiple blood products throughout the day Increasing O2 requirements, now on BIPAP Developed AFib with RVR around 1500  Filed Vitals:   11/01/13 1745 11/01/13 1800 11/01/13 1830 11/01/13 1835  BP: 129/64 134/116 143/112   Pulse: 136 177 171 75  Temp: 100 F (37.8 C) 100 F (37.8 C) 100 F (37.8 C)   TempSrc: Oral Oral Oral   Resp: 24 28 28 27   Height:      Weight:      SpO2: 96% 92% 91% 93%   Gen: on BIPAP, mild distress HEENT: NCAT,BIPAP mask in place PULM: Crackles in bases bilaterally CV: Tachy, irreg irreg, no mgr, no JVD AB: BS absent, midline scar Ext: warm, trace edema, no clubbing, no cyanosis  Impression: 1) s/p AAA repair 2) Acute respiratory failure due to pulm edema 3) Afib with RVR > limited in our ability to rate control with hypoxemia, BIPAP 4) Anemia 5) Shock> likely due to Afib, cardizem and diuresis 6) Hypokalemia  Plan: 1) intubate, sedate, mechanically ventilat 2) Cardizem, may need amiodarone 3) Neosynephrine now 4) replete K 5) Hold further lasix until BP improves  Wife updated at bedside  CC time by me 40 minutes  Jillyn Hidden PCCM Pager: [phone] Cell: [phone] If no response, call [phone]

## 2013-11-01 NOTE — Progress Notes (Signed)
Dr. Oneida Alar was paged with the post transfusion lab results.  Ionized calcium was still pending.  As a result of the elevated PTT, HGB of 8.5 and decreased platelet count the following was ordered - 2 PRBCs, 1 platelet and 2 FFP.  A consent for blood products for surgery is on the chart - 10/30/13 at 1400.  Dr. Oneida Alar advised that CCM can address the ionized calcium level when it results.

## 2013-11-01 NOTE — Progress Notes (Signed)
Name: Joseph [name] MRN: [id number] DOB: 08/16/1941    ADMISSION DATE:  10/31/2013 CONSULTATION DATE:  3/3  REFERRING MD :  Oneida Alar  PRIMARY SERVICE: vascular   CHIEF COMPLAINT:  Post-op vent/critical care   BRIEF PATIENT DESCRIPTION:  This is a 73 year old male w/ several medical co-morbids: COPD, H/o lung cancer (observation since May of 2013 with no evidence for disease progression), and cirrhosis. Admitted on 3/3 for elective extensive AAA repair (see below). PCCM asked to see post-op to assist w/ post op care.   SIGNIFICANT EVENTS:  3/3: s/p Aorto to left and right external iliac bypass, left internal iliac artery bypass, ligation right internal iliac aneurysm, reimplantation of inferior mesenteric artery. Thrombectomy of left and right iliac and femoral arteries 3/3 hypotension, mild agitation  STUDIES:   LINES / TUBES: OETT 3/3>>> Right PAC 3/3>>> Aline left rad 3/3>>> cvl thru introducer 3/4>>>  CULTURES: BCX2 3/4>>> Sputum 3/4>>>  ANTIBIOTICS: zinacef 3/3>>>3/4>>> vanc 3/4>> Zosyn 3/4>>>  SUBJECTIVE:  STILL HYPOTENSIVE   VITAL SIGNS: Temp:  [98.8 F (37.1 C)-102.4 F (39.1 C)] 102.4 F (39.1 C) (03/04 2000) Pulse Rate:  [44-177] 137 (03/04 2215) Resp:  [10-30] 16 (03/04 2215) BP: (63-172)/(42-116) 90/65 mmHg (03/04 2215) SpO2:  [84 %-100 %] 98 % (03/04 2215) Arterial Line BP: (67-192)/(45-83) 82/57 mmHg (03/04 2215) FiO2 (%):  [40 %-100 %] 100 % (03/04 2000) HEMODYNAMICS: PAP: (26-41)/(12-23) 41/21 mmHg CO:  [7.5 L/min-9 L/min] 9 L/min CI:  [3.4 L/min/m2-4.1 L/min/m2] 4.1 L/min/m2 VENTILATOR SETTINGS: Vent Mode:  [-] PRVC FiO2 (%):  [40 %-100 %] 100 % Set Rate:  [14 bmp-16 bmp] 14 bmp Vt Set:  [620 mL] 620 mL PEEP:  [5 cmH20] 5 cmH20 Pressure Support:  [5 cmH20-10 cmH20] 5 cmH20 Plateau Pressure:  [12 cmH20-18 cmH20] 17 cmH20 INTAKE / OUTPUT: Intake/Output     03/04 0701 - 03/05 0700   I.V. (mL/kg) 1489.5 (15.2)   Blood 1140   NG/GT  30   IV Piggyback 100   Total Intake(mL/kg) 2759.5 (28.1)   Urine (mL/kg/hr) 2015 (1.3)   Emesis/NG output 300 (0.2)   Blood    Total Output 2315   Net +444.5         PHYSICAL EXAMINATION: General:  No distress Neuro: RASS 0, follows commands HEENT:  Orally intubated  Cardiovascular: tachy irreg  Lungs: no wheeze, occ rhonchi  Abdomen:  Soft, wound dressing clean Musculoskeletal: no edema Skin: no rashes  LABS:  CBC  Recent Labs Lab 11/01/13 0345 11/01/13 1010 11/01/13 1705 11/01/13 1706  WBC 6.3 8.8 7.1  --   HGB 8.0* 8.5* 8.5*  --   HCT 22.0* 23.8* 23.8*  --   PLT 100* 93* 83* 83*   Coag's  Recent Labs Lab 10/31/13 2238 11/01/13 1010 11/01/13 1706  APTT 33 30 33  INR 1.32 1.37 1.29   BMET  Recent Labs Lab 10/31/13 2238 11/01/13 0345 11/01/13 1810  NA 141 141 139  K 3.9 3.9 3.3*  CL 104 104 102  CO2 22 23 25   BUN 13 14 13   CREATININE 0.74 0.77 0.68  GLUCOSE 198* 161* 112*   Electrolytes  Recent Labs Lab 10/31/13 1739 10/31/13 2238 11/01/13 0345 11/01/13 1810  CALCIUM 7.1* 7.3* 7.3* 7.5*  MG 1.1*  --  1.5 1.4*   ABG  Recent Labs Lab 11/01/13 0358 11/01/13 1530 11/01/13 2022  PHART 7.409 7.461* 7.355  PCO2ART 39.9 38.0 47.8*  PO2ART 90.4 80.6 82.4   Liver Enzymes  Recent Labs Lab 10/30/13 1407 11/01/13 0345  AST 34 29  ALT 29 13  ALKPHOS 154* 44  BILITOT 1.0 1.9*  ALBUMIN 3.4* 2.6*   Cardiac Enzymes  Recent Labs Lab 10/31/13 2255 11/01/13 1810  TROPONINI <0.30 <0.30   Glucose  Recent Labs Lab 10/31/13 1951 11/01/13 0045 11/01/13 0355 11/01/13 0747 11/01/13 1208  GLUCAP 164* 169* 151* 127* 118*    Imaging Dg Chest Port 1 View  11/01/2013   CLINICAL DATA:  Respiratory distress.  EXAM: PORTABLE CHEST - 1 VIEW  COMPARISON:  11/01/2013  FINDINGS: The endotracheal tube has been removed. Left subclavian Port-A-Cath tip in the upper SVC region. Nasogastric tube extends into the stomach. Right jugular central  line is still present. The Swan-Ganz catheter has been removed. Low lung volumes with patchy interstitial densities. Findings are suggestive for atelectasis. Again noted are underlying emphysematous changes.  IMPRESSION: Low lung volumes with persistent basilar densities. Findings are most compatible with atelectasis.  Support apparatuses as described.   Electronically Signed   By: Markus Daft M.D.   On: 11/01/2013 17:03   Dg Chest Portable 1 View  11/01/2013   CLINICAL DATA:  Post vascular surgery.  EXAM: PORTABLE CHEST - 1 VIEW  COMPARISON:  10/31/2013  FINDINGS: Endotracheal tube is 3.9 cm above the carina. There is a left subclavian Port-A-Cath with the tip near the junction of the SVC and azygos. Port-A-Cath tip position is unchanged. Swan-Ganz catheter tip is in the pulmonary outflow tract region. Hazy densities at the lung bases may represent atelectasis. Difficult to exclude small effusions. Severe emphysematous changes in the upper lungs. Chronic scarring in the medial right upper lung. Heart size is normal. No evidence for a pneumothorax.  IMPRESSION: Low lung volumes with bibasilar atelectasis.  Support apparatuses as described.   Electronically Signed   By: Markus Daft M.D.   On: 11/01/2013 07:52   Dg Chest Portable 1 View  10/31/2013   CLINICAL DATA:  Aortic aneurysm repair, postop  EXAM: PORTABLE CHEST - 1 VIEW  COMPARISON:  the previous day's study  FINDINGS: An endotracheal tube tip is at the thoracic inlet. Right IJ Swan-Ganz extends to the pulmonary outflow tract. Nasogastric tube at least as far as the stomach, tip not seen. Left subclavian port catheter to the proximal SVC. Linear scarring/ atelectasis at the right lung apex stable. Low lung volumes. Perihilar and bibasilar interstitial edema has increased. There is some patchy airspace consolidation at the left lung base. Possible small left effusion. .  IMPRESSION: 1. High position of endotracheal tube, tip of the thoracic inlet. 2. Increase  in bibasilar interstitial edema or infiltrates with possible small left effusion.   Electronically Signed   By: Arne Cleveland M.D.   On: 10/31/2013 18:44   Dg Chest Port 1 View  10/31/2013   CLINICAL DATA:  Reposition endotracheal tube  EXAM: PORTABLE CHEST - 1 VIEW  COMPARISON:  Earlier film of the same day  FINDINGS: The endotracheal tube tip is now 5.1 cm above carina. Nasogastric tube, right IJ Swan-Ganz catheter, and left subclavian port catheter stable in position. Hazy increase in opacity at the left lung base without obscuration of the lateral costophrenic angle suggesting layering effusion. Coarse linear scarring or atelectasis in the right lung apex stable. Bibasilar interstitial edema or infiltrates. Relatively low lung volumes. .  IMPRESSION: 1. Repositioned endotracheal tube, tip 5.1 cm above carina. 2. Persistent bibasilar interstitial edema/infiltrates with possible layering left pleural effusion.   Electronically Signed  By: Arne Cleveland M.D.   On: 10/31/2013 18:36   ASSESSMENT / PLAN:  PULMONARY A: Post-op ventilator dependence. H/o COPD/emphysema, lung ca. Worsening bilateral pulmonary infiltrates.  >worrisome for TRALI vs pulmonary edema.  P:   Full vent support Ck CVP Ck BNP F/u CXR intermittently Continue duoneb See ID section   CARDIOVASCULAR A:  AAA repair  Post op Hypovolemic/ hemorrhagic shock >> improved. Af w/ RVR s/p amio load  Persistent circulatory shock 3/4. Doubt hypovolemia at this point. Did have CI of 4.1 which would support high-out put state like sepsis.  Hx of hyperlipidemia, CAD. P:  amio gtt for AF w/ RVR Ck CVP, avoid hypovolemia  Cont neo, add vasopressin Ck cortisol Add stress dose steroids   RENAL A:   Mild hypokalemia and hypomagnesemia  P:   Replace lytes as needed Monitor renal fx, urine outpt, electrolytes  GASTROINTESTINAL A:   Hx of Cirrhosis. Nutrition.  P:   NPO >> advance diet after extubation per VVS Protonix  for SUP  HEMATOLOGIC A:   Acute blood loss anemia (post-op) >> EBL 8.5 liters. >most recent hgb holding. But hypotension raising concern for continued blood loss.  Mild coagulopathy (in setting of underlying liver disease and multiple blood products). Thrombocytopenia (consumptive, dilution). P: F/u CBC, coags SCD's Transfuse for hgb <8 given persistent hypotension/shock state   INFECTIOUS A:   New fever Worsening airspace disease on CXR  P:   Widen abx BC x2 send PCT   ENDOCRINE A:   Hx of hypothyroidism. Mild hyperglycemia . P:   Levothyroxine 87.5 mcg IV (1/2 home dose) SSI  NEUROLOGIC A:   Post-op pain control. P:   Sedation protocol while on vent     BABCOCK,PETE 11/01/2013, 10:27 PM

## 2013-11-01 NOTE — Progress Notes (Signed)
Called Dr. Oneida Alar in the OR to provide him an update as to the general progression of the pt's condition - in AM pt had been extubated to a N/C at 4L and di not tolerate so was placed on a VM at 50%.  As the shift progressed he required VM at 55% and then a NRB at 100% at 1410.  HR was initially upper one teens in the beginning of the shift and progressed to the low to mid 130s despite the blood products being administered.  Dr. Oneida Alar requested for me to follow-up with CCM.

## 2013-11-01 NOTE — Progress Notes (Signed)
Call placed to on-call vascular surgeon, Dr. Donnetta Hutching, to provide him an update since Dr. Oneida Alar had rounded in the evening. No new orders.

## 2013-11-01 NOTE — Progress Notes (Signed)
Dr. Halford Chessman ordered pt to be placed on Bipap.  RT notified.

## 2013-11-01 NOTE — Progress Notes (Signed)
Name: Joseph [name] MRN: [id number] DOB: [birth year]    ADMISSION DATE:  10/31/2013 CONSULTATION DATE:  3/3  REFERRING MD :  Oneida Alar  PRIMARY SERVICE: vascular   CHIEF COMPLAINT:  Post-op vent/critical care   BRIEF PATIENT DESCRIPTION:  This is a 73 year old male w/ several medical co-morbids: COPD, H/o lung cancer (observation since May of 2013 with no evidence for disease progression), and cirrhosis. Admitted on 3/3 for elective extensive AAA repair (see below). PCCM asked to see post-op to assist w/ post op care.   SIGNIFICANT EVENTS:  3/3: s/p Aorto to left and right external iliac bypass, left internal iliac artery bypass, ligation right internal iliac aneurysm, reimplantation of inferior mesenteric artery. Thrombectomy of left and right iliac and femoral arteries 3/3 hypotension, mild agitation  STUDIES:   LINES / TUBES: OETT 3/3>>> Right PAC 3/3>>> Aline left rad 3/3>>>  CULTURES: None  ANTIBIOTICS: zinacef 3/3>>>  SUBJECTIVE:  Tolerating pressure support.  VITAL SIGNS: Temp:  [97.5 F (36.4 C)-101.1 F (38.4 C)] 100.2 F (37.9 C) (03/04 0700) Pulse Rate:  [97-133] 114 (03/04 0700) Resp:  [12-30] 15 (03/04 0700) BP: (64-127)/(42-82) 97/57 mmHg (03/04 0700) SpO2:  [94 %-100 %] 100 % (03/04 0700) Arterial Line BP: (66-150)/(41-73) 99/56 mmHg (03/04 0700) FiO2 (%):  [50 %-60 %] 60 % (03/04 0700) Weight:  [216 lb 9 oz (98.232 kg)] 216 lb 9 oz (98.232 kg) (03/03 1830) HEMODYNAMICS: PAP: (26-48)/(12-26) 35/20 mmHg CO:  [5.5 L/min-7.6 L/min] 7.6 L/min CI:  [2.5 L/min/m2-3.5 L/min/m2] 3.5 L/min/m2 VENTILATOR SETTINGS: Vent Mode:  [-] PRVC FiO2 (%):  [50 %-60 %] 60 % Set Rate:  [12 bmp-16 bmp] 16 bmp Vt Set:  [620 mL] 620 mL PEEP:  [5 cmH20] 5 cmH20 Pressure Support:  [10 cmH20] 10 cmH20 Plateau Pressure:  [12 cmH20-18 cmH20] 18 cmH20 INTAKE / OUTPUT: Intake/Output     03/03 0701 - 03/04 0700 03/04 0701 - 03/05 0700   I.V. (mL/kg) 13760.9 (140.1)     Blood 9927.7    NG/GT 30    IV Piggyback 1900    Total Intake(mL/kg) 25618.6 (260.8)    Urine (mL/kg/hr) 1360 (0.6)    Blood 8500 (3.6)    Total Output 9860     Net +15758.6            PHYSICAL EXAMINATION: General:  No distress Neuro: RASS 0, follows commands HEENT:  Orally intubated  Cardiovascular: regular Lungs: no wheeze Abdomen:  Soft, wound dressing clean Musculoskeletal: no edema Skin: no rashes  LABS:  CBC  Recent Labs Lab 10/31/13 1638 10/31/13 2255 11/01/13 0345  WBC 7.3 7.3 6.3  HGB 9.6* 6.2* 8.0*  HCT 27.3* 17.5* 22.0*  PLT 69* 125* 100*   Coag's  Recent Labs Lab 10/31/13 1408 10/31/13 1638 10/31/13 2238  APTT >200* 47* 33  INR 1.70* 1.97* 1.32   BMET  Recent Labs Lab 10/31/13 1739 10/31/13 2238 11/01/13 0345  NA 143 141 141  K 4.2 3.9 3.9  CL 108 104 104  CO2 23 22 23   BUN 11 13 14   CREATININE 0.63 0.74 0.77  GLUCOSE 156* 198* 161*   Electrolytes  Recent Labs Lab 10/31/13 1739 10/31/13 2238 11/01/13 0345  CALCIUM 7.1* 7.3* 7.3*  MG 1.1*  --  1.5   ABG  Recent Labs Lab 10/31/13 1607 10/31/13 1745 11/01/13 0358  PHART 7.402 7.324* 7.409  PCO2ART 39.5 46.1* 39.9  PO2ART 209.0* 63.0* 90.4   Liver Enzymes  Recent Labs Lab 10/30/13  1407 11/01/13 0345  AST 34 29  ALT 29 13  ALKPHOS 154* 44  BILITOT 1.0 1.9*  ALBUMIN 3.4* 2.6*   Cardiac Enzymes  Recent Labs Lab 10/31/13 2255  TROPONINI <0.30   Glucose  Recent Labs Lab 10/31/13 1951 11/01/13 0045 11/01/13 0355  GLUCAP 164* 169* 151*    Imaging Dg Chest 2 View  10/30/2013   CLINICAL DATA:  Preoperative films for patient with an abdominal aortic aneurysm.  EXAM: CHEST  2 VIEW  COMPARISON:  CT chest 07/17/2013.  FINDINGS: The lungs are emphysematous with scarring in the right upper lobe, unchanged. No consolidative process, pneumothorax or effusion. Heart size is in place.  IMPRESSION: Emphysema without acute disease.   Electronically Signed   By:  Inge Rise M.D.   On: 10/30/2013 16:14   Dg Chest Portable 1 View  10/31/2013   CLINICAL DATA:  Aortic aneurysm repair, postop  EXAM: PORTABLE CHEST - 1 VIEW  COMPARISON:  the previous day's study  FINDINGS: An endotracheal tube tip is at the thoracic inlet. Right IJ Swan-Ganz extends to the pulmonary outflow tract. Nasogastric tube at least as far as the stomach, tip not seen. Left subclavian port catheter to the proximal SVC. Linear scarring/ atelectasis at the right lung apex stable. Low lung volumes. Perihilar and bibasilar interstitial edema has increased. There is some patchy airspace consolidation at the left lung base. Possible small left effusion. .  IMPRESSION: 1. High position of endotracheal tube, tip of the thoracic inlet. 2. Increase in bibasilar interstitial edema or infiltrates with possible small left effusion.   Electronically Signed   By: Arne Cleveland M.D.   On: 10/31/2013 18:44   Dg Chest Port 1 View  10/31/2013   CLINICAL DATA:  Reposition endotracheal tube  EXAM: PORTABLE CHEST - 1 VIEW  COMPARISON:  Earlier film of the same day  FINDINGS: The endotracheal tube tip is now 5.1 cm above carina. Nasogastric tube, right IJ Swan-Ganz catheter, and left subclavian port catheter stable in position. Hazy increase in opacity at the left lung base without obscuration of the lateral costophrenic angle suggesting layering effusion. Coarse linear scarring or atelectasis in the right lung apex stable. Bibasilar interstitial edema or infiltrates. Relatively low lung volumes. .  IMPRESSION: 1. Repositioned endotracheal tube, tip 5.1 cm above carina. 2. Persistent bibasilar interstitial edema/infiltrates with possible layering left pleural effusion.   Electronically Signed   By: Arne Cleveland M.D.   On: 10/31/2013 18:36   ASSESSMENT / PLAN:  PULMONARY A: Post-op ventilator dependence. H/o COPD/emphysema, lung ca. P:   Pressure support wean as tolerated >> hopefully extubate soon F/u CXR  intermittently Continue duoneb  CARDIOVASCULAR A:  AAA repair  Post op Hypovolemic/ hemorrhagic shock >> improved. Hx of hyperlipidemia, CAD. P:  Wean off pressors as tolerated  RENAL A:   No issues. P:   Monitor renal fx, urine outpt, electrolytes  GASTROINTESTINAL A:   Hx of Cirrhosis. Nutrition.  P:   NPO >> advance diet after extubation per VVS Protonix for SUP  HEMATOLOGIC A:   Acute blood loss anemia (post-op) >> EBL 8.5 liters. Mild coagulopathy (in setting of underlying liver disease and multiple blood products). Thrombocytopenia (consumptive, dilution). P: F/u CBC, coags SCD's  INFECTIOUS A:   No evidence for infection.  P:   Monitor clinically  ENDOCRINE A:   Hx of hypothyroidism. Mild hyperglycemia . P:   Levothyroxine 87.5 mcg IV (1/2 home dose) SSI  NEUROLOGIC A:   Post-op pain  control. P:   Sedation protocol while on vent  CC time 35 minutes.  Chesley Mires, MD [facility] Pulmonary/Critical Care 11/01/2013, 7:54 AM Pager:  [phone] After 3pm call: [phone]

## 2013-11-01 NOTE — Progress Notes (Signed)
Wife called in to come visit with pt. She was advised that the pt was stable and that there were some additional interventions that needed to be completed before she could come in.  She was agreeable to this.  She has been present for the majority of the day, with the exception of leaving for a few hours during the day to take a shower and change clothes.  She has been updated throughout the day in reference to the pt's change in condition and revisions to course of treatment.

## 2013-11-01 NOTE — Progress Notes (Signed)
Pt placed on Bipap at 100%.  Sats are 93-94%

## 2013-11-01 NOTE — Progress Notes (Signed)
Critical Care Team rounded on the pt.  Advised I should have a CBC, PT, PTT and if not previously sent - a DIC - drawn and sent between PRBC units.  Dr. Halford Chessman had also placed an order for a CXR and ABG to be performed.

## 2013-11-01 NOTE — Procedures (Signed)
Central Venous Catheter Insertion Procedure Note REYNOLD MANTELL [id number] 03/02/1941  Procedure: Insertion of Central Venous Catheter Indications: Assessment of intravascular volume and Drug and/or fluid administration  Procedure Details Consent: Unable to obtain consent because of emergent medical necessity. Time Out: Verified patient identification, verified procedure, site/side was marked, verified correct patient position, special equipment/implants available, medications/allergies/relevent history reviewed, required imaging and test results available.  Performed  Maximum sterile technique was used including antiseptics, cap, gloves, gown, hand hygiene, mask and sheet. Skin prep: Chlorhexidine; local anesthetic administered A antimicrobial bonded/coated triple lumen catheter was placed in the right internal jugular vein via the Cordis introducer.  Evaluation Blood flow good Complications: No apparent complications Patient did tolerate procedure well. Chest X-ray ordered to verify placement.  CXR: pending.  MCQUAID, DOUGLAS 11/01/2013, 10:47 PM

## 2013-11-01 NOTE — Procedures (Addendum)
Extubation Procedure Note  Patient Details:   Name: Joseph [name] DOB: Aug 19, 1941 MRN: [id number]   Airway Documentation:     Evaluation  O2 sats: stable throughout Complications: No apparent complications Patient did tolerate procedure well. Bilateral Breath Sounds: Clear Suctioning: Airway Yes pt able to vocalize.  Pt extubated at this time per MD order. Pt tolerated well. No complications. Pt placed on 6L [place]. Pt able to breathe around deflated cuff. Pt has strong, adequate cough and able to vocalize. VS stable. IS performed [id number].  RT will continue to monitor.  Irineo Axon [facility] 11/01/2013, 9:17 AM

## 2013-11-01 NOTE — Progress Notes (Addendum)
[facility] [place]   Subjective  -       Patient is alert and intubated.  He is writing out questions and concerns.    Objective 97/57 114 100.2 F (37.9 C) (Core (Comment)) 15 100%  Intake/Output Summary (Last 24 hours) at 11/01/13 0729 Last data filed at 11/01/13 0700  Gross per 24 hour  Intake 25618.61 ml  Output   9860 ml  Net 15758.61 ml    Palpable DP/PT pulses bil Abdomin distended no BS, but soft and non tender Incision dressing with min. Spotty drainage, no active bleeding    Assessment/Planning: POD #1 Blood loss anemia during surgy and post-op  Intraoperative and post-op  3 platelets, 7 units of PRBC, Frozen plasma   10 units He had HGB 6.2 SBP ranging from 70-110. His HR was in 130's.  Dr. Trula Slade ordered 2 units PRBC last night his neo was up to 175.  This am HGB Pending and neo 110.  BP 104/64 and HR 118 Urine output 20 cc per hour    Joseph [name], Joseph [name] 11/01/2013 7:29 AM --  Laboratory Lab Results:  Recent Labs  10/31/13 2255 11/01/13 0345  WBC 7.3 6.3  HGB 6.2* 8.0*  HCT 17.5* 22.0*  PLT 125* 100*   BMET  Recent Labs  10/31/13 2238 11/01/13 0345  NA 141 141  K 3.9 3.9  CL 104 104  CO2 22 23  GLUCOSE 198* 161*  BUN 13 14  CREATININE 0.74 0.77  CALCIUM 7.3* 7.3*    COAG Lab Results  Component Value Date   INR 1.32 10/31/2013   INR 1.97* 10/31/2013   INR 1.70* 10/31/2013   No results found for this basename: PTT    Fairly stable overnight.  Still requiring some Neo but has improved with volume/transfusion. Urine output marginal overnight but improving this morning. Pt awake and alert and following commands 2+ DP pulses Abdomen slightly distended but not tense Incision intact  1. Acute blood loss anemia correcting with transfusion.  Recheck CBC later today 2.  Coagulopathy- baseline thrombocytopenia still with slightly elevated PT earlier today.  Will recheck with CBC and transfuse FFP/plt as  necessary to fully correct 3. Severe protein calorie malnutrition Albumin 2.5.  Will consider starting some trickle tube feeds when off Neo but will probably have several days of ileus with extent of operation/length/fluid shifts etc 4.  Vent wean per CCM 5.  Hypotension probably combination of hypervolemia and hyperdynamic/SIRS with his underlying liver problems.  Continue to support with Neo for now hopefully wean as day progresses  6.  Diabetes reasonable control for now continue sliding scale insulin  Ruta Hinds, MD [facility] [place] Office: [phone] Pager: [phone]

## 2013-11-01 NOTE — Progress Notes (Signed)
Dr. Oneida Alar rounded in the evening and was provided an update.  Dr. Oneida Alar spoke with the wife in the waiting area.

## 2013-11-01 NOTE — Procedures (Signed)
Intubation Procedure Note Joseph [name] [id number] Jan 29, 1941  Procedure: Intubation Indications: Respiratory insufficiency  Procedure Details Consent: Unable to obtain consent because of emergent medical necessity. Time Out: Verified patient identification, verified procedure, site/side was marked, verified correct patient position, special equipment/implants available, medications/allergies/relevent history reviewed, required imaging and test results available.  Performed  Drugs Etomidate 20mg , Rocuronium 70mg , Versed 4mg , Fentanyl 167mcg DL x 1 with GS 4 blade Grade 1 view 7.5 tube passed through cords under direct visualization Placement confirmed with bilateral breath sounds, positive EtCO2 change and smoke in tube   Evaluation Hemodynamic Status: BP stable throughout; O2 sats: stable throughout Patient's Current Condition: stable Complications: No apparent complications Patient did tolerate procedure well. Chest X-ray ordered to verify placement.  CXR: pending.   MCQUAID, DOUGLAS 11/01/2013

## 2013-11-01 NOTE — Progress Notes (Signed)
Called by nurse while I was in the OR regarding a critical Hb of 6.2  The patient had SBP ranging from 70-110.  His HR was in 130's.  HE was on NEO at 175.  I ordered 2 units of pRBC.  When I came to check on patient, his BP was more stable in the 120's and his HR was beteer in the 120's.  He was responsive on the vent.  His abdomen was slightly distended, but soft.  NEO was down to 145.  Coags nearly corrected  Will re-heck labs after blood in. Patient looking better after transfusion Recommended to wean neo as tolerated and give volume for low BP   Wells Steen Bisig

## 2013-11-01 NOTE — Progress Notes (Addendum)
At 1755 Pt was in afib RVR in the 160s to low 200s.  Notified Dr. Halford Chessman.  EKG confirmed afib RVR.  Dr. Halford Chessman ordered a cardizem bolus and cardizem drip.  Dr. Halford Chessman also ordered labs -  BMP, MG, and troponin.

## 2013-11-01 NOTE — Progress Notes (Signed)
Called Dr. Donnetta Hutching to provide the update that the pt had been intubated.  No new orders.

## 2013-11-01 NOTE — Progress Notes (Signed)
Dr. Halford Chessman placed an order for 40mg  lasix.

## 2013-11-01 NOTE — Progress Notes (Signed)
Pt was intubated.  #7.5 at 25.  Dr. Lake Bells had rounded and felt this was best to stabilize the pt.  He did go out to the waiting room and discuss this with the wife.  The pt was given 159mcg fentanyl, 2mg  versed, 20mg  etomidate and 70mg  of rocuronium for intubation.  Dr. Lake Bells intubated with assistance of a glidoscope.  Pt tolerated well.  RT was present and assisting.  Positive lung sounds.

## 2013-11-02 ENCOUNTER — Inpatient Hospital Stay (HOSPITAL_COMMUNITY): Payer: Medicare Other

## 2013-11-02 DIAGNOSIS — J449 Chronic obstructive pulmonary disease, unspecified: Secondary | ICD-10-CM

## 2013-11-02 DIAGNOSIS — Z9911 Dependence on respirator [ventilator] status: Secondary | ICD-10-CM

## 2013-11-02 LAB — POCT I-STAT 3, ART BLOOD GAS (G3+)
Bicarbonate: 24.5 mEq/L — ABNORMAL HIGH (ref 20.0–24.0)
O2 SAT: 90 %
PH ART: 7.39 (ref 7.350–7.450)
Patient temperature: 98.6
TCO2: 26 mmol/L (ref 0–100)
pCO2 arterial: 40.5 mmHg (ref 35.0–45.0)
pO2, Arterial: 60 mmHg — ABNORMAL LOW (ref 80.0–100.0)

## 2013-11-02 LAB — TYPE AND SCREEN
ABO/RH(D): O POS
Antibody Screen: NEGATIVE
UNIT DIVISION: 0
UNIT DIVISION: 0
Unit division: 0
Unit division: 0
Unit division: 0
Unit division: 0
Unit division: 0
Unit division: 0
Unit division: 0

## 2013-11-02 LAB — PREPARE PLATELET PHERESIS: Unit division: 0

## 2013-11-02 LAB — GLUCOSE, CAPILLARY
GLUCOSE-CAPILLARY: 99 mg/dL (ref 70–99)
GLUCOSE-CAPILLARY: 177 mg/dL — AB (ref 70–99)
GLUCOSE-CAPILLARY: 158 mg/dL — AB (ref 70–99)
GLUCOSE-CAPILLARY: 123 mg/dL — AB (ref 70–99)
Glucose-Capillary: 118 mg/dL — ABNORMAL HIGH (ref 70–99)
Glucose-Capillary: 121 mg/dL — ABNORMAL HIGH (ref 70–99)
Glucose-Capillary: 149 mg/dL — ABNORMAL HIGH (ref 70–99)

## 2013-11-02 LAB — CBC
HCT: 27.1 % — ABNORMAL LOW (ref 39.0–52.0)
Hemoglobin: 9.6 g/dL — ABNORMAL LOW (ref 13.0–17.0)
MCH: 32.3 pg (ref 26.0–34.0)
MCHC: 35.4 g/dL (ref 30.0–36.0)
MCV: 91.2 fL (ref 78.0–100.0)
PLATELETS: 128 10*3/uL — AB (ref 150–400)
RBC: 2.97 MIL/uL — ABNORMAL LOW (ref 4.22–5.81)
RDW: 15.5 % (ref 11.5–15.5)
WBC: 13.2 10*3/uL — ABNORMAL HIGH (ref 4.0–10.5)

## 2013-11-02 LAB — PREPARE FRESH FROZEN PLASMA
UNIT DIVISION: 0
Unit division: 0

## 2013-11-02 LAB — BASIC METABOLIC PANEL
BUN: 14 mg/dL (ref 6–23)
BUN: 14 mg/dL (ref 6–23)
CALCIUM: 7.3 mg/dL — AB (ref 8.4–10.5)
CALCIUM: 7.5 mg/dL — AB (ref 8.4–10.5)
CO2: 22 meq/L (ref 19–32)
CO2: 24 mEq/L (ref 19–32)
CREATININE: 0.81 mg/dL (ref 0.50–1.35)
CREATININE: 0.73 mg/dL (ref 0.50–1.35)
Chloride: 99 mEq/L (ref 96–112)
Chloride: 97 mEq/L (ref 96–112)
GFR calc Af Amer: >90 mL/min (ref >90)
GFR calc Af Amer: >90 mL/min (ref >90)
GFR calc non Af Amer: 88 mL/min — ABNORMAL LOW (ref >90)
GLUCOSE: 125 mg/dL — AB (ref 70–99)
Glucose, Bld: 186 mg/dL — ABNORMAL HIGH (ref 70–99)
Potassium: 3.6 mEq/L — ABNORMAL LOW (ref 3.7–5.3)
Potassium: 3.6 mEq/L — ABNORMAL LOW (ref 3.7–5.3)
SODIUM: 137 meq/L (ref 137–147)
SODIUM: 133 meq/L — AB (ref 137–147)

## 2013-11-02 LAB — TROPONIN I

## 2013-11-02 LAB — PRO B NATRIURETIC PEPTIDE: PRO B NATRI PEPTIDE: 602.3 pg/mL — AB (ref 0–125)

## 2013-11-02 LAB — CORTISOL: Cortisol, Plasma: 17 ug/dL

## 2013-11-02 LAB — MAGNESIUM: Magnesium: 1.7 mg/dL (ref 1.5–2.5)

## 2013-11-02 MED ORDER — FENTANYL CITRATE 0.05 MG/ML IJ SOLN
50.0000 ug | INTRAMUSCULAR | Status: DC | PRN
  Administered 2013-11-02 – 2013-11-04 (×9): 50 ug via INTRAVENOUS
  Administered 2013-11-04: 25 ug via INTRAVENOUS
  Administered 2013-11-05 (×2): 50 ug via INTRAVENOUS
  Administered 2013-11-05: 25 ug via INTRAVENOUS
  Administered 2013-11-06 (×2): 50 ug via INTRAVENOUS
  Filled 2013-11-02 (×15): qty 2

## 2013-11-02 MED ORDER — INSULIN ASPART 100 UNIT/ML ~~LOC~~ SOLN
0.0000 [IU] | SUBCUTANEOUS | Status: DC
  Administered 2013-11-02: 4 [IU] via SUBCUTANEOUS
  Administered 2013-11-02 – 2013-11-03 (×5): 3 [IU] via SUBCUTANEOUS
  Administered 2013-11-03: 4 [IU] via SUBCUTANEOUS
  Administered 2013-11-09: 3 [IU] via SUBCUTANEOUS

## 2013-11-02 MED ORDER — POTASSIUM CHLORIDE 10 MEQ/50ML IV SOLN
10.0000 meq | INTRAVENOUS | Status: AC
  Administered 2013-11-02 (×2): 10 meq via INTRAVENOUS
  Filled 2013-11-02 (×2): qty 50

## 2013-11-02 MED ORDER — POTASSIUM CHLORIDE 20 MEQ/15ML (10%) PO LIQD: 20.0000 meq | Freq: Once | ORAL | Status: DC

## 2013-11-02 MED ORDER — MIDAZOLAM HCL 2 MG/2ML IJ SOLN
1.0000 mg | INTRAMUSCULAR | Status: DC | PRN
  Administered 2013-11-02 (×2): 1 mg via INTRAVENOUS
  Filled 2013-11-02 (×4): qty 2

## 2013-11-02 MED ORDER — MIDAZOLAM HCL 2 MG/2ML IJ SOLN
1.0000 mg | INTRAMUSCULAR | Status: DC | PRN
  Administered 2013-11-02: 1 mg via INTRAVENOUS
  Filled 2013-11-02: qty 2

## 2013-11-02 MED ORDER — FUROSEMIDE 10 MG/ML IJ SOLN
20.0000 mg | Freq: Once | INTRAMUSCULAR | Status: AC
  Administered 2013-11-02: 20 mg via INTRAVENOUS
  Filled 2013-11-02: qty 2

## 2013-11-02 MED ORDER — DEXMEDETOMIDINE HCL IN NACL 400 MCG/100ML IV SOLN
0.0000 ug/kg/h | INTRAVENOUS | Status: AC
  Administered 2013-11-02 (×2): 1.2 ug/kg/h via INTRAVENOUS
  Administered 2013-11-02: 0.7 ug/kg/h via INTRAVENOUS
  Administered 2013-11-02 – 2013-11-04 (×10): 1.2 ug/kg/h via INTRAVENOUS
  Filled 2013-11-02: qty 50
  Filled 2013-11-02 (×14): qty 100

## 2013-11-02 MED ORDER — MIDAZOLAM HCL 2 MG/2ML IJ SOLN
2.0000 mg | INTRAMUSCULAR | Status: DC | PRN
  Administered 2013-11-02: 2 mg via INTRAVENOUS
  Administered 2013-11-02: 4 mg via INTRAVENOUS
  Administered 2013-11-03: 2 mg via INTRAVENOUS
  Administered 2013-11-03: 4 mg via INTRAVENOUS
  Administered 2013-11-03 – 2013-11-04 (×6): 2 mg via INTRAVENOUS
  Filled 2013-11-02: qty 2
  Filled 2013-11-02 (×2): qty 4
  Filled 2013-11-02 (×4): qty 2
  Filled 2013-11-02: qty 4

## 2013-11-02 NOTE — Progress Notes (Signed)
RT Note:  Sputum Culture Obtained and sent to lab.

## 2013-11-02 NOTE — Progress Notes (Signed)
PT Cancellation Note  Patient Details Name: Joseph [name] MRN: [id number] DOB: 04/20/1941   Cancelled Treatment:    Reason Eval/Treat Not Completed: Medical issues which prohibited therapy.  Blood gases in the 60's. 11/02/2013  Joseph [name], [place] [phone]  (pager)   Joseph [name], Tessie Fass 11/02/2013, 3:08 PM

## 2013-11-02 NOTE — Progress Notes (Signed)
Name: KARSIN PESTA MRN: [id number] DOB: 28-Feb-1941    ADMISSION DATE:  10/31/2013 CONSULTATION DATE:  3/3  REFERRING MD :  Oneida Alar   CHIEF COMPLAINT:  Post-op vent/critical care   BRIEF PATIENT DESCRIPTION:  This is a 73 year old male w/ several medical co-morbids: COPD, H/o lung cancer (observation since May of 2013 with no evidence for disease progression), and cirrhosis. Admitted on 3/3 for elective extensive AAA repair (see below). PCCM asked to see post-op to assist w/ post op care.   SIGNIFICANT EVENTS:  3/03 Aorto to Lt/Rt external iliac bypass, lt internal iliac bypass, ligation Rt internal iliac aneurysm, re-implantation inferior mesenteric artery, thrombectomy Lt/Rt iliac and femoral arteries 3/04 Extubated >> respiratory distress, A fib RVR, hypotension, fever >> pressors, intubated, Abx, solucortef, amiodarone 3/05 Converted to sinus rhythm  STUDIES:   LINES / TUBES: ETT 3/03 >>> 3/04 Right PAC 3/3 >>>3/04 Aline left rad 3/3 >>> Rt IJ CVL 3/04 >>>  CULTURES: Blood 3/04 >>> Sputum 3/04 >>>  ANTIBIOTICS: zinacef 3/3 >>> 3/04 Vancomycin 3/04 >>> Zosyn 3/04 >>>  SUBJECTIVE:  Events of last night noted.  Remains on pressors, increased FiO2.  Converted back to sinus rhythm.  VITAL SIGNS: Temp:  [98.6 F (37 C)-102.4 F (39.1 C)] 98.6 F (37 C) (03/05 0755) Pulse Rate:  [44-177] 97 (03/05 0745) Resp:  [12-30] 17 (03/05 0745) BP: (63-172)/(42-116) 118/63 mmHg (03/05 0730) SpO2:  [84 %-100 %] 94 % (03/05 0745) Arterial Line BP: (67-192)/(45-83) 115/59 mmHg (03/05 0745) FiO2 (%):  [50 %-100 %] 100 % (03/05 0400) HEMODYNAMICS: PAP: (32-41)/(19-21) 41/21 mmHg CVP:  [12 mmHg-19 mmHg] 15 mmHg VENTILATOR SETTINGS: Vent Mode:  [-] PRVC FiO2 (%):  [50 %-100 %] 100 % Set Rate:  [14 bmp] 14 bmp Vt Set:  [620 mL] 620 mL PEEP:  [5 cmH20] 5 cmH20 Plateau Pressure:  [14 cmH20-17 cmH20] 14 cmH20 INTAKE / OUTPUT: Intake/Output     03/04 0701 - 03/05 0700  03/05 0701 - 03/06 0700   I.V. (mL/kg) 3660.9 (37.3)    Blood 1140    NG/GT 60 30   IV Piggyback 800 50   Total Intake(mL/kg) 5660.9 (57.6) 80 (0.8)   Urine (mL/kg/hr) 2365 (1)    Emesis/NG output 300 (0.1)    Blood     Total Output 2665     Net +2995.9 +80          PHYSICAL EXAMINATION: General:  No distress Neuro: RASS -1, follows commands HEENT:  Orally intubated  Cardiovascular: regular, murmur Lungs: no wheeze, scattered rhonchi Abdomen:  Soft, wound dressing clean Musculoskeletal: 1+ edema Skin: no rashes  LABS:  CBC  Recent Labs Lab 11/01/13 1705 11/01/13 1706 11/01/13 2300 11/02/13 0405  WBC 7.1  --  14.0* 13.2*  HGB 8.5*  --  9.8* 9.6*  HCT 23.8*  --  27.6* 27.1*  PLT 83* 83* 130* 128*   Coag's  Recent Labs Lab 10/31/13 2238 11/01/13 1010 11/01/13 1706  APTT 33 30 33  INR 1.32 1.37 1.29   BMET  Recent Labs Lab 11/01/13 0345 11/01/13 1810 11/02/13 0405  NA 141 139 137  K 3.9 3.3* 3.6*  CL 104 102 99  CO2 23 25 22   BUN 14 13 14   CREATININE 0.77 0.68 0.81  GLUCOSE 161* 112* 186*   Electrolytes  Recent Labs Lab 10/31/13 1739  11/01/13 0345 11/01/13 1810 11/02/13 0405  CALCIUM 7.1*  < > 7.3* 7.5* 7.3*  MG 1.1*  --  1.5 1.4*  --   < > = values in this interval not displayed.  ABG  Recent Labs Lab 11/01/13 0358 11/01/13 1530 11/01/13 2022  PHART 7.409 7.461* 7.355  PCO2ART 39.9 38.0 47.8*  PO2ART 90.4 80.6 82.4   Liver Enzymes  Recent Labs Lab 10/30/13 1407 11/01/13 0345  AST 34 29  ALT 29 13  ALKPHOS 154* 44  BILITOT 1.0 1.9*  ALBUMIN 3.4* 2.6*   Cardiac Enzymes  Recent Labs Lab 10/31/13 2255 11/01/13 1810 11/01/13 2105 11/02/13 0025  TROPONINI <0.30 <0.30 <0.30  --   PROBNP  --   --   --  602.3*   Glucose  Recent Labs Lab 11/01/13 1208 11/01/13 1613 11/01/13 2136 11/02/13 0015 11/02/13 0323 11/02/13 0753  GLUCAP 118* 99 134* 149* 177* 158*    Imaging Portable Chest Xray In Am  11/02/2013    CLINICAL DATA:  Intubated  EXAM: PORTABLE CHEST - 1 VIEW  COMPARISON:  DG CHEST 1V PORT dated 11/01/2013; DG CHEST 1V PORT dated 11/01/2013; DG CHEST 1V PORT dated 11/01/2013; DG CHEST 1V PORT dated 11/01/2013; CT CHEST W/CM dated 03/06/2013; CT CHEST W/CM dated 07/17/2013  FINDINGS: Endotracheal tube 3.2 cm from carinal. Left port and right central venous line are unchanged. There are low lung volumes and basilar atelectasis. Small effusions. Bullous change and scarring in the right upper lobe is noted. No pneumothorax evident.  IMPRESSION: 1. Stable support apparatus. 2. Bibasilar atelectasis and effusions. 3. Stable bullous change in scarring in the right upper lobe.   Electronically Signed   By: Suzy Bouchard M.D.   On: 11/02/2013 08:33   Dg Chest Port 1 View  11/02/2013   CLINICAL DATA:  Endotracheal tube placement  EXAM: PORTABLE CHEST - 1 VIEW  COMPARISON:  11/02/2011  FINDINGS: Endotracheal tube in good position. NG tube enters the stomach. Port-A-Cath tip in the SVC is unchanged.  Right jugular catheter tip in the right innominate vein.  Interval increase in bibasilar airspace disease. Small bilateral effusions also increased.  Underlying chronic lung disease and scarring especially the right upper lobe.  IMPRESSION: Progression of bibasilar consolidation and effusions. Question fluid overload   Electronically Signed   By: Franchot Gallo M.D.   On: 11/02/2013 07:06   Dg Chest Port 1 View  11/01/2013   CLINICAL DATA:  LINE PLACEMENT  EXAM: PORTABLE CHEST - 1 VIEW  COMPARISON:  DG CHEST 1V PORT dated 11/01/2013  FINDINGS: A right internal jugular sheath and central venous catheter is appreciated the central venous catheter tip projects the level of the superior vena cava. A left-sided port catheter is appreciated with tip projecting in the region of superior vena cava. Endotracheal tube is identified with tip approximately 3 cm above the carina. A NG tube is seen with tip 11 review this study. Low lung volumes.  There is diffuse thickening of the interstitial markings. Areas of increased density projects within the lung bases. Is blunting of left costophrenic angle. Cardiac silhouette is mildly enlarged. Aorta is tortuous and ectatic. Atherosclerotic calcifications identified. The osseous structures unremarkable.  IMPRESSION: Support lines and tubes which appear to be adequately positioned. Otherwise no significant change in chest radiograph. There is no evidence of pneumothorax.   Electronically Signed   By: Margaree Mackintosh M.D.   On: 11/01/2013 23:35   Dg Chest Port 1 View  11/01/2013   CLINICAL DATA:  Respiratory distress.  EXAM: PORTABLE CHEST - 1 VIEW  COMPARISON:  11/01/2013  FINDINGS: The endotracheal tube  has been removed. Left subclavian Port-A-Cath tip in the upper SVC region. Nasogastric tube extends into the stomach. Right jugular central line is still present. The Swan-Ganz catheter has been removed. Low lung volumes with patchy interstitial densities. Findings are suggestive for atelectasis. Again noted are underlying emphysematous changes.  IMPRESSION: Low lung volumes with persistent basilar densities. Findings are most compatible with atelectasis.  Support apparatuses as described.   Electronically Signed   By: Markus Daft M.D.   On: 11/01/2013 17:03   Dg Chest Portable 1 View  11/01/2013   CLINICAL DATA:  Post vascular surgery.  EXAM: PORTABLE CHEST - 1 VIEW  COMPARISON:  10/31/2013  FINDINGS: Endotracheal tube is 3.9 cm above the carina. There is a left subclavian Port-A-Cath with the tip near the junction of the SVC and azygos. Port-A-Cath tip position is unchanged. Swan-Ganz catheter tip is in the pulmonary outflow tract region. Hazy densities at the lung bases may represent atelectasis. Difficult to exclude small effusions. Severe emphysematous changes in the upper lungs. Chronic scarring in the medial right upper lung. Heart size is normal. No evidence for a pneumothorax.  IMPRESSION: Low lung  volumes with bibasilar atelectasis.  Support apparatuses as described.   Electronically Signed   By: Markus Daft M.D.   On: 11/01/2013 07:52   Dg Chest Portable 1 View  10/31/2013   CLINICAL DATA:  Aortic aneurysm repair, postop  EXAM: PORTABLE CHEST - 1 VIEW  COMPARISON:  the previous day's study  FINDINGS: An endotracheal tube tip is at the thoracic inlet. Right IJ Swan-Ganz extends to the pulmonary outflow tract. Nasogastric tube at least as far as the stomach, tip not seen. Left subclavian port catheter to the proximal SVC. Linear scarring/ atelectasis at the right lung apex stable. Low lung volumes. Perihilar and bibasilar interstitial edema has increased. There is some patchy airspace consolidation at the left lung base. Possible small left effusion. .  IMPRESSION: 1. High position of endotracheal tube, tip of the thoracic inlet. 2. Increase in bibasilar interstitial edema or infiltrates with possible small left effusion.   Electronically Signed   By: Arne Cleveland M.D.   On: 10/31/2013 18:44   Dg Chest Port 1 View  10/31/2013   CLINICAL DATA:  Reposition endotracheal tube  EXAM: PORTABLE CHEST - 1 VIEW  COMPARISON:  Earlier film of the same day  FINDINGS: The endotracheal tube tip is now 5.1 cm above carina. Nasogastric tube, right IJ Swan-Ganz catheter, and left subclavian port catheter stable in position. Hazy increase in opacity at the left lung base without obscuration of the lateral costophrenic angle suggesting layering effusion. Coarse linear scarring or atelectasis in the right lung apex stable. Bibasilar interstitial edema or infiltrates. Relatively low lung volumes. .  IMPRESSION: 1. Repositioned endotracheal tube, tip 5.1 cm above carina. 2. Persistent bibasilar interstitial edema/infiltrates with possible layering left pleural effusion.   Electronically Signed   By: Arne Cleveland M.D.   On: 10/31/2013 18:36   ASSESSMENT / PLAN:  PULMONARY A: Post-op ventilator dependence. Acute  respiratory failure 3/04 2nd to shock, atelectasis, A fib. H/o COPD/emphysema, lung ca. P:   Full vent support Adjust PEEP/FiO2 to keep SpO2 > 92% F/u CXR, ABG Continue duoneb  CARDIOVASCULAR A:  AAA repair  Post op Hypovolemic/ hemorrhagic shock. A fib with RVR 3/04 >> sinus rhythm 3/05. Hx of hyperlipidemia, CAD. P:  Pressors to keep SBP > 90, MAP > 65 Goal CVP 8 to 12 Continue solucortef until off pressors  RENAL A:   Hypokalemia. P:   Monitor renal fx, urine outpt, electrolytes  GASTROINTESTINAL A:   Hx of Cirrhosis. Nutrition.  P:   NPO Tube feeds if unable to extubate soon when okay with VVS Protonix for SUP  HEMATOLOGIC A:   Acute blood loss anemia (post-op) >> EBL 8.5 liters. Mild coagulopathy (in setting of underlying liver disease and multiple blood products). Thrombocytopenia (consumptive, dilution). P: F/u CBC SCD's  INFECTIOUS A:   Fever 3/04.  P:   Day 2 vancomycin, zosyn >> narrow abx once cx results available  ENDOCRINE A:   Hx of hypothyroidism. Mild hyperglycemia . P:   Levothyroxine 87.5 mcg IV (1/2 home dose) SSI  NEUROLOGIC A:   Post-op pain control. P:   Sedation protocol while on vent  Updated family at bedside.  CC time 35 minutes.  Chesley Mires, MD [facility] Pulmonary/Critical Care 11/02/2013, 8:43 AM Pager:  [phone] After 3pm call: [phone]

## 2013-11-02 NOTE — Progress Notes (Signed)
Obtained follow-up ABG due to this morning's vent setting changes - peep increased to 10 and O2 decreased to 80.  ABG WNL except O2 of 60%.  Pt is expressing that he is working hard to breathe - confirming by shaking his head and motioning.  He is fighting the vent and causing it to alarm.  Notified RT and they advised that the pt is breath stacking.  Pt most likely needs more sedation to help him relax and not work against the ventilator.  Paged Dr. Halford Chessman and discussed all of this with him.  He is going to review his sedation and place orders to revise.

## 2013-11-02 NOTE — Progress Notes (Addendum)
[facility] [place]  Subjective  - intubated alert.   Objective 113/57 93 98.8 F (37.1 C) (Oral) 17 95%  Intake/Output Summary (Last 24 hours) at 11/02/13 0722 Last data filed at 11/02/13 0656  Gross per 24 hour  Intake 5454.57 ml  Output   2630 ml  Net 2824.57 ml   Palpable pulses bil feet. Incision clean and dry. Abdomin no BS, soft, NTTP No acute distress Heart RRR 90 bpm BP 113/57   Assessment/Planning: POD #2 Aorto to left and right external iliac bypass, left internal iliac artery bypass, ligation right internal iliac aneurysm, reimplantation of inferior mesenteric artery. Thrombectomy of left and right iliac and femoral arteries  Re intubated at 7 pm yesterday due to respiratory failure.  Hypovolemia post-op Received platelets, PRBC, and Plasma yesterday.  HGB 9.6 this am.  Renal CR 0.81  Temp spike 102.4 WBC 14.0 11/01/2013 now WBC 13.2 On Zosyn and Vancomycin sputum cultures sent.  Pending results.  Currently Stable    Theda Sers, EMMA MAUREEN 11/02/2013 7:22 AM --  Laboratory Lab Results:  Recent Labs  11/01/13 2300 11/02/13 0405  WBC 14.0* 13.2*  HGB 9.8* 9.6*  HCT 27.6* 27.1*  PLT 130* 128*   BMET  Recent Labs  11/01/13 1810 11/02/13 0405  NA 139 137  K 3.3* 3.6*  CL 102 99  CO2 25 22  GLUCOSE 112* 186*  BUN 13 14  CREATININE 0.68 0.81  CALCIUM 7.5* 7.3*    COAG Lab Results  Component Value Date   INR 1.29 11/01/2013   INR 1.37 11/01/2013   INR 1.32 10/31/2013   No results found for this basename: PTT      Reintubated last pm for respiratory failure On Vasopressin/Neo Afib last night now on Amiodarone Up approx 19 L positive fluid balance Tm 102  HR 94 BP 108/58 Urine output 30-40/hr  Abdomen slightly distended 2+ DP pulses   1.  Acute blood loss anemia improved with transfusion hemoglobin stabilizing at this point, coags appear corrected 2.  Afib on Amiodarone rate controlled hopefully  improve with fluid loss 3.  Shock probably multifactorial wean Neo and Vaso as tolerated should be easier now that hypovolemic issues seem to be resolving, On solucortef per CCM 4.  VDRF will need to have several liters of fluid off before trying to extubate again 5.  Febrile cultues pending most likely multifactorial acute phase at this point  Vanc/Zosyn D1 6.  Malnutrition start some trickle tube feeds when vasopressors off 7.  Hypervolemia decrease IV fluids gentle diuresis over the next few days 8.  Hyperglycemia will increase sliding scale, should improve when steroids discontinued 9.  Hypokalemia being repleted  Ruta Hinds, MD [facility] [place] Office: [phone] Pager: [phone]

## 2013-11-02 NOTE — Progress Notes (Signed)
INITIAL NUTRITION ASSESSMENT  DOCUMENTATION CODES Per approved criteria  -Not Applicable   INTERVENTION:  If TF started, recommend Vital AF 1.2 formula -- initiate at 15 ml/hr and increase by 10 ml every 4 hours to goal rate of 75 ml/hr to provide 2160 kcals, 135 gm protein, 1459 ml of free water RD to follow for nutrition care plan  NUTRITION DIAGNOSIS: Inadequate oral intake related to inability to eat as evidenced by NPO status  Goal: Pt to meet >/= 90% of their estimated nutrition needs   Monitor:  TF initiation & tolerance, respiratory status, weight, labs, I/O's  Reason for Assessment: VDRF, Low Braden  73 y.o. 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 year old male who presents for evaluation of abdominal aortic aneurysm. Patient returns today after recent CT scan of the abdomen and pelvis did discuss an intervention The patient denies abdominal pain. The patient denies back pain. The patient has family history of AAA in his brother. Other medical problems include stage 3 lung cancer dormant status post chemo radiation. 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 He denies significant cardiac history. He is also being considered for left knee replacement by Dr. Durward Fortes. Other chronic medical problems include hyperlipidemia, COPD, possible cirrhosis by CT scan.  Patient s/p procedure 3/3: AORTO TO LEFT AND RIGHT EXTERNAL ILIAC BYPASS LEFT INTERNAL ILIAC ARTERY BYPASS LIGATION RIGHT INTERNAL ILIAC ANEURYSM REIMPLANTATION OF INTERIOR MESENTERIC ARTERY THROMBECTOMY OF LEFT AND RIGHT ILIAC AND FEMORAL ARTERIES  Patient is currently intubated on ventilator support -- NGT in place MV: 13.5 L/min Temp (24hrs), Avg:99.7 F (37.6 C), Min:98.6 F (37 C), Max:102.4 F (39.1 C)   Low braden score places patient at risk for skin breakdown.    If prolonged intubation expected, recommend initiation of EN  support within next 24-48 hours.  Height: Ht Readings from Last 1 Encounters:  10/31/13 6' (1.829 m)    Weight: Wt Readings from Last 1 Encounters:  10/31/13 216 lb 9 oz (98.232 kg)    Ideal Body Weight: 178 lb  % Ideal Body Weight: 121%  Wt Readings from Last 10 Encounters:  10/31/13 216 lb 9 oz (98.232 kg)  10/31/13 216 lb 9 oz (98.232 kg)  10/30/13 216 lb 9 oz (98.232 kg)  10/21/13 220 lb 14.4 oz (100.2 kg)  10/21/13 220 lb 14.4 oz (100.2 kg)  10/09/13 222 lb (100.699 kg)  09/29/13 222 lb (100.699 kg)  09/21/13 217 lb 9.6 oz (98.703 kg)  09/07/13 214 lb (97.07 kg)  08/10/13 211 lb 12.8 oz (96.072 kg)    Usual Body Weight: 220 lb  % Usual Body Weight: 98%  BMI:  Body mass index is 29.36 kg/(m^2).  Estimated Nutritional Needs: Kcal: 2100-2300 Protein: 125-135 gm Fluid: per MD  Skin: surgical abdominal incision   Diet Order: NPO  EDUCATION NEEDS: -No education needs identified at this time   Intake/Output Summary (Last 24 hours) at 11/02/13 1153 Last data filed at 11/02/13 1100  Gross per 24 hour  Intake 5103.57 ml  Output   3275 ml  Net 1828.57 ml    Labs:   Recent Labs Lab 10/31/13 1739  11/01/13 0345 11/01/13 1810 11/02/13 0405  NA 143  < > 141 139 137  K 4.2  < > 3.9 3.3* 3.6*  CL 108  < > 104 102 99  CO2 23  < > 23 25 22   BUN 11  < >  14 13 14   CREATININE 0.63  < > 0.77 0.68 0.81  CALCIUM 7.1*  < > 7.3* 7.5* 7.3*  MG 1.1*  --  1.5 1.4*  --   GLUCOSE 156*  < > 161* 112* 186*  < > = values in this interval not displayed.  CBG (last 3)   Recent Labs  11/02/13 0015 11/02/13 0323 11/02/13 0753  GLUCAP 149* 177* 158*    Scheduled Meds: . antiseptic oral rinse  15 mL Mouth Rinse QID  . chlorhexidine  15 mL Mouth Rinse BID  . docusate sodium  100 mg Oral Daily  . hydrocortisone sod succinate (SOLU-CORTEF) inj  50 mg Intravenous Q6H  . insulin aspart  0-20 Units Subcutaneous 6 times per day  . ipratropium-albuterol  3 mL  Nebulization Q6H  . levothyroxine  87.5 mcg Intravenous Daily  . pantoprazole (PROTONIX) IV  40 mg Intravenous QHS  . piperacillin-tazobactam (ZOSYN)  IV  3.375 g Intravenous Q8H  . potassium chloride  10 mEq Intravenous Q1 Hr x 2  . vancomycin  1,000 mg Intravenous 3 times per day    Continuous Infusions: . sodium chloride 20 mL/hr (10/31/13 1847)  . sodium chloride 20 mL/hr at 11/01/13 2318  . amiodarone (NEXTERONE PREMIX) 360 mg/200 mL dextrose 30 mg/hr (11/02/13 1100)  . dexmedetomidine 1.2 mcg/kg/hr (11/02/13 1100)  . dextrose 5 % and 0.45% NaCl 125 mL/hr at 11/02/13 0326  . phenylephrine (NEO-SYNEPHRINE) Adult infusion 100 mcg/min (11/02/13 1100)  . vasopressin (PITRESSIN) infusion - *FOR SHOCK* 0.03 Units/min (11/02/13 1100)    Past Medical History  Diagnosis Date  . History of colon polyps 2006,2008  . 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 Hyperlipidemia     takes Pravastatin  . Emphysema   . Lung cancer     right upper lobe  . Enlarged prostate     takes Rapaflo daily  . Arthritis     left knee and back arthrtis  . Hypothyroidism     takes Synthroid daily  . Cataract   . Hx of radiation therapy 09/02/11 to 10/19/11    chest  . Diverticulosis   . Cirrhosis     By radiography, seen on CT  . Rosacea   . CAD (coronary artery disease)   . Shortness of breath   . Heart murmur     teen    Past Surgical History  Procedure Laterality Date  . Hernia repair  70's  . Vasectomy  70's  . Finger surgery  2008    left pointer finger  . Knee arthroscopy  2008    left knee  . Tonsillectomy      as a child  . Colonoscopy w/ biopsies      multiple   . Cardiac catheterization  2003  . Esophagogastroduodenoscopy  2006  . Portacath placement  08/10/2011    Procedure: INSERTION PORT-A-CATH;  Surgeon: Pierre Bali, MD;  Location: [facility];  Service: Thoracic;  Laterality: Left;  Left Subclavian  . Abdominal aortic aneurysm repair N/A 10/31/2013    Procedure: aorto to left  external iliac and right external iliac and left internal iliac, reimplantation of inferior mesenteric artery and ligation of left iliac artery aneursym;  Surgeon: Elam Dutch, MD;  Location: [facility];  Service: Vascular;  Laterality: N/A;  . Thrombectomy iliac artery Bilateral 10/31/2013    Procedure: THROMBECTOMY ILIAC ARTERY;  Surgeon: Elam Dutch, MD;  Location: [facility];  Service: Vascular;  Laterality: Bilateral;  Arthur Holms, RD, LDN Pager #: [phone] After-Hours Pager #: [phone]

## 2013-11-02 NOTE — Progress Notes (Signed)
[facility] ADULT ICU REPLACEMENT PROTOCOL FOR AM LAB REPLACEMENT ONLY  The patient does apply for the [facility] Adult ICU Electrolyte Replacment Protocol based on the criteria listed below:   1. Is GFR >/= 40 ml/min? yes  Patient's GFR today is >90 2. Is urine output >/= 0.5 ml/kg/hr for the last 6 hours? yes Patient's UOP is 0.58 ml/kg/hr 3. Is BUN < 60 mg/dL? yes  Patient's BUN today is 14 4. Abnormal electrolyte(s):Potassium 5. Ordered repletion with: Potassium per Protocol  Kenson Groh P 11/02/2013 6:48 AM

## 2013-11-02 NOTE — Progress Notes (Signed)
OT Cancellation Note  Patient Details Name: Joseph [name] MRN: [id number] DOB: 12/06/1940   Cancelled Treatment:    Reason Eval/Treat Not Completed: Patient not medically ready. Pt with respiratory failure last night, reintubated. Will continue to follow.  Malka So 11/02/2013, 8:25 AM

## 2013-11-03 ENCOUNTER — Inpatient Hospital Stay (HOSPITAL_COMMUNITY): Payer: Medicare Other

## 2013-11-03 DIAGNOSIS — I4891 Unspecified atrial fibrillation: Secondary | ICD-10-CM

## 2013-11-03 LAB — GLUCOSE, CAPILLARY
GLUCOSE-CAPILLARY: 154 mg/dL — AB (ref 70–99)
GLUCOSE-CAPILLARY: 128 mg/dL — AB (ref 70–99)
GLUCOSE-CAPILLARY: 100 mg/dL — AB (ref 70–99)
Glucose-Capillary: 129 mg/dL — ABNORMAL HIGH (ref 70–99)
Glucose-Capillary: 132 mg/dL — ABNORMAL HIGH (ref 70–99)
Glucose-Capillary: 104 mg/dL — ABNORMAL HIGH (ref 70–99)

## 2013-11-03 LAB — BASIC METABOLIC PANEL
BUN: 19 mg/dL (ref 6–23)
CHLORIDE: 97 meq/L (ref 96–112)
CO2: 23 meq/L (ref 19–32)
Calcium: 7.5 mg/dL — ABNORMAL LOW (ref 8.4–10.5)
Creatinine, Ser: 0.67 mg/dL (ref 0.50–1.35)
GFR calc non Af Amer: >90 mL/min (ref >90)
Glucose, Bld: 143 mg/dL — ABNORMAL HIGH (ref 70–99)
Potassium: 3.5 mEq/L — ABNORMAL LOW (ref 3.7–5.3)
SODIUM: 134 meq/L — AB (ref 137–147)

## 2013-11-03 LAB — BLOOD GAS, ARTERIAL
ACID-BASE EXCESS: 1.1 mmol/L (ref 0.0–2.0)
Bicarbonate: 24.8 mEq/L — ABNORMAL HIGH (ref 20.0–24.0)
DRAWN BY: 252031
FIO2: 0.8 %
LHR: 16 {breaths}/min
O2 SAT: 96.3 %
PEEP: 10 cmH2O
Patient temperature: 98.6
TCO2: 25.9 mmol/L (ref 0–100)
VT: 620 mL
pCO2 arterial: 36.5 mmHg (ref 35.0–45.0)
pH, Arterial: 7.446 (ref 7.350–7.450)
pO2, Arterial: 71.1 mmHg — ABNORMAL LOW (ref 80.0–100.0)

## 2013-11-03 LAB — CBC
HEMATOCRIT: 25 % — AB (ref 39.0–52.0)
Hemoglobin: 8.8 g/dL — ABNORMAL LOW (ref 13.0–17.0)
MCH: 31.9 pg (ref 26.0–34.0)
MCHC: 35.2 g/dL (ref 30.0–36.0)
MCV: 90.6 fL (ref 78.0–100.0)
PLATELETS: 80 10*3/uL — AB (ref 150–400)
RBC: 2.76 MIL/uL — ABNORMAL LOW (ref 4.22–5.81)
RDW: 15.2 % (ref 11.5–15.5)
WBC: 7 10*3/uL (ref 4.0–10.5)

## 2013-11-03 MED ORDER — POTASSIUM CHLORIDE 10 MEQ/50ML IV SOLN
10.0000 meq | INTRAVENOUS | Status: DC
  Filled 2013-11-03: qty 50

## 2013-11-03 MED ORDER — HYDROCORTISONE NA SUCCINATE PF 100 MG IJ SOLR
50.0000 mg | Freq: Two times a day (BID) | INTRAMUSCULAR | Status: DC
  Administered 2013-11-04 – 2013-11-05 (×4): 50 mg via INTRAVENOUS
  Filled 2013-11-03 (×5): qty 1

## 2013-11-03 MED ORDER — POTASSIUM CHLORIDE 20 MEQ/15ML (10%) PO LIQD
40.0000 meq | Freq: Once | ORAL | Status: AC
  Administered 2013-11-03: 40 meq
  Filled 2013-11-03: qty 30

## 2013-11-03 MED ORDER — FUROSEMIDE 10 MG/ML IJ SOLN
20.0000 mg | Freq: Two times a day (BID) | INTRAMUSCULAR | Status: AC
  Administered 2013-11-03 – 2013-11-06 (×8): 20 mg via INTRAVENOUS
  Filled 2013-11-03 (×9): qty 2

## 2013-11-03 MED ORDER — POTASSIUM CHLORIDE 20 MEQ/15ML (10%) PO LIQD
20.0000 meq | Freq: Two times a day (BID) | ORAL | Status: DC
  Administered 2013-11-03 – 2013-11-05 (×6): 20 meq via ORAL
  Filled 2013-11-03 (×10): qty 15

## 2013-11-03 NOTE — Progress Notes (Signed)
[facility] ADULT ICU REPLACEMENT PROTOCOL FOR AM LAB REPLACEMENT ONLY  The patient does apply for the [facility] Adult ICU Electrolyte Replacment Protocol based on the criteria listed below:   1. Is GFR >/= 40 ml/min? yes  Patient's GFR today is >90 2. Is urine output >/= 0.5 ml/kg/hr for the last 6 hours? yes Patient's UOP is 0.5 ml/kg/hr 3. Is BUN < 60 mg/dL? yes  Patient's BUN today is 19 4. Abnormal electrolyte(s): K 3.55. Ordered repletion with: per protocol 6. If a panic level lab has been reported, has the CCM MD in charge been notified? no.   Physician:    Ronda Fairly A 11/03/2013 6:16 AM

## 2013-11-03 NOTE — Progress Notes (Signed)
OT Cancellation Note  Patient Details Name: NUR KRASINSKI MRN: [id number] DOB: 22-Aug-1941   Cancelled Treatment:    Reason Eval/Treat Not Completed: Patient not medically ready (Pt remains intubated and on pressors. Will sign off.) Please reorder when appropriate.  Malka So 11/03/2013, 8:08 AM

## 2013-11-03 NOTE — Progress Notes (Signed)
Name: Joseph [name] MRN: [id number] DOB: [birth year]    ADMISSION DATE:  10/31/2013 CONSULTATION DATE:  10/31/2013  REFERRING MD :  Oneida Alar   CHIEF COMPLAINT:  Post-op vent/critical care   BRIEF PATIENT DESCRIPTION:  This is a 73 year old male w/ several medical co-morbids: COPD, H/o lung cancer (observation since May of 2013 with no evidence for disease progression), and cirrhosis. Admitted on 3/3 for elective extensive AAA repair (see below). PCCM asked to see post-op to assist w/ post op care.   SIGNIFICANT EVENTS:  3/03 Aorto to Lt/Rt external iliac bypass, lt internal iliac bypass, ligation Rt internal iliac aneurysm, re-implantation inferior mesenteric artery, thrombectomy Lt/Rt iliac and femoral arteries 3/04 Extubated >> respiratory distress, A fib RVR, hypotension, fever >> pressors, intubated, Abx, solucortef, amiodarone 3/05 Converted to sinus rhythm  STUDIES:   LINES / TUBES: ETT 3/03 >>> 3/04 Right PAC 3/3 >>>3/04 Aline left rad 3/3 >>> Rt IJ CVL 3/04 >>>  CULTURES: Blood 3/04 >>> Sputum 3/04 >>>  ANTIBIOTICS: zinacef 3/3 >>> 3/04 Vancomycin 3/04 >>> Zosyn 3/04 >>>  SUBJECTIVE:  Weaning off pressors.  Remains on increased PEEP,FiO2.  VITAL SIGNS: Temp:  [97.6 F (36.4 C)-98.8 F (37.1 C)] 97.6 F (36.4 C) (03/06 0739) Pulse Rate:  [73-88] 79 (03/06 0900) Resp:  [14-27] 17 (03/06 0900) BP: (81-132)/(54-79) 129/75 mmHg (03/06 0900) SpO2:  [89 %-98 %] 95 % (03/06 0900) Arterial Line BP: (82-158)/(52-82) 135/70 mmHg (03/06 0900) FiO2 (%):  [80 %] 80 % (03/06 0802) Weight:  [251 lb 8.7 oz (114.1 kg)] 251 lb 8.7 oz (114.1 kg) (03/06 0500) HEMODYNAMICS: CVP:  [9 mmHg-22 mmHg] 11 mmHg VENTILATOR SETTINGS: Vent Mode:  [-] PRVC FiO2 (%):  [80 %] 80 % Set Rate:  [16 bmp] 16 bmp Vt Set:  [620 mL] 620 mL PEEP:  [10 cmH20] 10 cmH20 Plateau Pressure:  [21 cmH20-24 cmH20] 21 cmH20 INTAKE / OUTPUT: Intake/Output     03/05 0701 - 03/06 0700 03/06 0701 -  03/07 0700   I.V. (mL/kg) 1996.3 (17.5) 150.4 (1.3)   Blood     NG/GT 150 30   IV Piggyback 900    Total Intake(mL/kg) 3046.3 (26.7) 180.4 (1.6)   Urine (mL/kg/hr) 1425 (0.5) 475 (1)   Emesis/NG output 100 (0) 100 (0.2)   Total Output 1525 575   Net +1521.3 -394.6          PHYSICAL EXAMINATION: General:  No distress Neuro: RASS -1, follows commands HEENT:  Orally intubated  Cardiovascular: regular, murmur Lungs: no wheeze, scattered rhonchi Abdomen:  Soft, wound dressing clean Musculoskeletal: 1+ edema Skin: no rashes  LABS:  CBC  Recent Labs Lab 11/01/13 2300 11/02/13 0405 11/03/13 0341  WBC 14.0* 13.2* 7.0  HGB 9.8* 9.6* 8.8*  HCT 27.6* 27.1* 25.0*  PLT 130* 128* 80*   Coag's  Recent Labs Lab 10/31/13 2238 11/01/13 1010 11/01/13 1706  APTT 33 30 33  INR 1.32 1.37 1.29   BMET  Recent Labs Lab 11/02/13 0405 11/02/13 1655 11/03/13 0341  NA 137 133* 134*  K 3.6* 3.6* 3.5*  CL 99 97 97  CO2 22 24 23   BUN 14 14 19   CREATININE 0.81 0.73 0.67  GLUCOSE 186* 125* 143*   Electrolytes  Recent Labs Lab 11/01/13 0345 11/01/13 1810 11/02/13 0405 11/02/13 1655 11/03/13 0341  CALCIUM 7.3* 7.5* 7.3* 7.5* 7.5*  MG 1.5 1.4*  --  1.7  --    ABG  Recent Labs Lab 11/01/13 2022 11/02/13  0954 11/03/13 0356  PHART 7.355 7.390 7.446  PCO2ART 47.8* 40.5 36.5  PO2ART 82.4 60.0* 71.1*   Liver Enzymes  Recent Labs Lab 10/30/13 1407 11/01/13 0345  AST 34 29  ALT 29 13  ALKPHOS 154* 44  BILITOT 1.0 1.9*  ALBUMIN 3.4* 2.6*   Cardiac Enzymes  Recent Labs Lab 11/01/13 1810 11/01/13 2105 11/02/13 0025 11/02/13 0905  TROPONINI <0.30 <0.30  --  <0.30  PROBNP  --   --  602.3*  --    Glucose  Recent Labs Lab 11/02/13 1141 11/02/13 1601 11/02/13 1910 11/02/13 2317 11/03/13 0327 11/03/13 0735  GLUCAP 121* 118* 123* 129* 132* 154*    Imaging Dg Chest Port 1 View  11/03/2013   CLINICAL DATA:  Postop for thrombectomy and iliac artery  surgery for abdominal aortic aneurysm.  EXAM: PORTABLE CHEST - 1 VIEW  COMPARISON:  DG CHEST 1V PORT dated 11/02/2013  FINDINGS: Endotracheal tube 5.5 cm above carina. Left-sided Port-A-Cath unchanged with tip at high SVC. Right internal jugular line unchanged with tip also at high SVC. Nasogastric tube extends beyond the inferior aspect of the film. Cardiomegaly with a tortuous thoracic aorta. Layering small left pleural effusion is unchanged. Improved to resolved right-sided pleural effusion. No pneumothorax. Minimal improvement in mild interstitial edema. Similar left and improved right base airspace disease. Right apical bullous emphysema.  IMPRESSION: Slight improvement in aeration with decreased interstitial edema and right base airspace disease/ pleural fluid.  Persistent left base airspace disease and adjacent pleural fluid.   Electronically Signed   By: Abigail Miyamoto M.D.   On: 11/03/2013 08:09   Portable Chest Xray In Am  11/02/2013   CLINICAL DATA:  Intubated  EXAM: PORTABLE CHEST - 1 VIEW  COMPARISON:  DG CHEST 1V PORT dated 11/01/2013; DG CHEST 1V PORT dated 11/01/2013; DG CHEST 1V PORT dated 11/01/2013; DG CHEST 1V PORT dated 11/01/2013; CT CHEST W/CM dated 03/06/2013; CT CHEST W/CM dated 07/17/2013  FINDINGS: Endotracheal tube 3.2 cm from carinal. Left port and right central venous line are unchanged. There are low lung volumes and basilar atelectasis. Small effusions. Bullous change and scarring in the right upper lobe is noted. No pneumothorax evident.  IMPRESSION: 1. Stable support apparatus. 2. Bibasilar atelectasis and effusions. 3. Stable bullous change in scarring in the right upper lobe.   Electronically Signed   By: Suzy Bouchard M.D.   On: 11/02/2013 08:33   Dg Chest Port 1 View  11/02/2013   CLINICAL DATA:  Endotracheal tube placement  EXAM: PORTABLE CHEST - 1 VIEW  COMPARISON:  11/02/2011  FINDINGS: Endotracheal tube in good position. NG tube enters the stomach. Port-A-Cath tip in the SVC is  unchanged.  Right jugular catheter tip in the right innominate vein.  Interval increase in bibasilar airspace disease. Small bilateral effusions also increased.  Underlying chronic lung disease and scarring especially the right upper lobe.  IMPRESSION: Progression of bibasilar consolidation and effusions. Question fluid overload   Electronically Signed   By: Franchot Gallo M.D.   On: 11/02/2013 07:06   Dg Chest Port 1 View  11/01/2013   CLINICAL DATA:  LINE PLACEMENT  EXAM: PORTABLE CHEST - 1 VIEW  COMPARISON:  DG CHEST 1V PORT dated 11/01/2013  FINDINGS: A right internal jugular sheath and central venous catheter is appreciated the central venous catheter tip projects the level of the superior vena cava. A left-sided port catheter is appreciated with tip projecting in the region of superior vena cava. Endotracheal tube is  identified with tip approximately 3 cm above the carina. A NG tube is seen with tip 11 review this study. Low lung volumes. There is diffuse thickening of the interstitial markings. Areas of increased density projects within the lung bases. Is blunting of left costophrenic angle. Cardiac silhouette is mildly enlarged. Aorta is tortuous and ectatic. Atherosclerotic calcifications identified. The osseous structures unremarkable.  IMPRESSION: Support lines and tubes which appear to be adequately positioned. Otherwise no significant change in chest radiograph. There is no evidence of pneumothorax.   Electronically Signed   By: Margaree Mackintosh M.D.   On: 11/01/2013 23:35   Dg Chest Port 1 View  11/01/2013   CLINICAL DATA:  Respiratory distress.  EXAM: PORTABLE CHEST - 1 VIEW  COMPARISON:  11/01/2013  FINDINGS: The endotracheal tube has been removed. Left subclavian Port-A-Cath tip in the upper SVC region. Nasogastric tube extends into the stomach. Right jugular central line is still present. The Swan-Ganz catheter has been removed. Low lung volumes with patchy interstitial densities. Findings are  suggestive for atelectasis. Again noted are underlying emphysematous changes.  IMPRESSION: Low lung volumes with persistent basilar densities. Findings are most compatible with atelectasis.  Support apparatuses as described.   Electronically Signed   By: Markus Daft M.D.   On: 11/01/2013 17:03   ASSESSMENT / PLAN:  PULMONARY A: Post-op ventilator dependence. Acute respiratory failure 3/04 2nd to shock, atelectasis, A fib. H/o COPD/emphysema, lung ca. P:   Full vent support Adjust PEEP/FiO2 to keep SpO2 > 92% F/u CXR, ABG Continue duoneb  CARDIOVASCULAR A:  AAA repair  Post op Hypovolemic/ hemorrhagic shock. A fib with RVR 3/04 >> sinus rhythm 3/05. Hx of hyperlipidemia, CAD. P:  D/c vasopressin 3/06 Goal CVP 8 to 12 >> agree with lasix on 3/06 Start to wean off solu cortef 3/06  RENAL A:   Hypokalemia. P:   Monitor renal fx, urine outpt, electrolytes  GASTROINTESTINAL A:   Hx of Cirrhosis. Nutrition.  P:   NPO Tube feeds if unable to extubate soon when okay with VVS Protonix for SUP  HEMATOLOGIC A:   Acute blood loss anemia (post-op) >> EBL 8.5 liters. Mild coagulopathy (in setting of underlying liver disease and multiple blood products). Thrombocytopenia (consumptive, dilution). P: F/u CBC SCD's  INFECTIOUS A:   Fever 3/04.  P:   Day 3 vancomycin, zosyn >> narrow abx once cx results available  ENDOCRINE A:   Hx of hypothyroidism. Mild hyperglycemia . P:   Levothyroxine 87.5 mcg IV (1/2 home dose) SSI  NEUROLOGIC A:   Post-op pain control. P:   Sedation protocol while on vent  CC time 35 minutes.  Chesley Mires, MD [facility] Pulmonary/Critical Care 11/03/2013, 11:08 AM Pager:  [phone] After 3pm call: [phone]

## 2013-11-03 NOTE — Progress Notes (Signed)
PT Cancellation Note/ Discharge  Patient Details Name: Joseph [name] MRN: [id number] DOB: 15-Mar-1941   Cancelled Treatment:    Reason Eval/Treat Not Completed: Patient not medically ready (order received post-op however pt with medical decline and currenlty on vent with FIO2 80% and not appropriate. Please reorder)   Lanetta Inch Beth 11/03/2013, 9:13 AM Elwyn Reach, [place]

## 2013-11-03 NOTE — Progress Notes (Addendum)
[facility]  Subjective  - Per nursing staff he had a "good" night.  Still intubated plan to diurese today.   Objective 132/79 77 97.7 F (36.5 C) (Oral) 14 97%  Intake/Output Summary (Last 24 hours) at 11/03/13 0724 Last data filed at 11/03/13 0600  Gross per 24 hour  Intake 2921.1 ml  Output   1525 ml  Net 1396.1 ml    Palpable DP pulses bilaterally Abdomin soft, distended Resting well this am  Assessment/Planning: POD #3 BP 137/79, HR 72  Temp 97.7 WBC 7.0 remains on IV antibiotics.  No organisms on sputum culture. Up approx 19 L positive fluid balance.  Plan diurese today.     Laurence Slate [facility] 11/03/2013 7:24 AM -- Agree with above Minimal vent settings All other organ systems functioning Abdomen distended but unchanged from yesterday Still on Vasopressin hopefully wean off today Will consider starting some trickle feeds when Vasopressin off Will try to get 2-3 liters negative per day over the next few days.   Ruta Hinds, MD [facility] [place] Office: [phone] Pager: [phone]  Laboratory Lab Results:  Recent Labs  11/02/13 0405 11/03/13 0341  WBC 13.2* 7.0  HGB 9.6* 8.8*  HCT 27.1* 25.0*  PLT 128* 80*   BMET  Recent Labs  11/02/13 1655 11/03/13 0341  NA 133* 134*  K 3.6* 3.5*  CL 97 97  CO2 24 23  GLUCOSE 125* 143*  BUN 14 19  CREATININE 0.73 0.67  CALCIUM 7.5* 7.5*    COAG Lab Results  Component Value Date   INR 1.29 11/01/2013   INR 1.37 11/01/2013   INR 1.32 10/31/2013   No results found for this basename: PTT

## 2013-11-04 ENCOUNTER — Inpatient Hospital Stay (HOSPITAL_COMMUNITY): Payer: Medicare Other

## 2013-11-04 LAB — GLUCOSE, CAPILLARY
GLUCOSE-CAPILLARY: 77 mg/dL (ref 70–99)
GLUCOSE-CAPILLARY: 97 mg/dL (ref 70–99)
Glucose-Capillary: 101 mg/dL — ABNORMAL HIGH (ref 70–99)
Glucose-Capillary: 105 mg/dL — ABNORMAL HIGH (ref 70–99)
Glucose-Capillary: 115 mg/dL — ABNORMAL HIGH (ref 70–99)
Glucose-Capillary: 88 mg/dL (ref 70–99)

## 2013-11-04 LAB — CBC
HCT: 25.8 % — ABNORMAL LOW (ref 39.0–52.0)
HEMOGLOBIN: 9.1 g/dL — AB (ref 13.0–17.0)
MCH: 31.9 pg (ref 26.0–34.0)
MCHC: 35.3 g/dL (ref 30.0–36.0)
MCV: 90.5 fL (ref 78.0–100.0)
Platelets: 96 10*3/uL — ABNORMAL LOW (ref 150–400)
RBC: 2.85 MIL/uL — ABNORMAL LOW (ref 4.22–5.81)
RDW: 14.7 % (ref 11.5–15.5)
WBC: 5.5 10*3/uL (ref 4.0–10.5)

## 2013-11-04 LAB — BASIC METABOLIC PANEL
BUN: 17 mg/dL (ref 6–23)
CALCIUM: 8 mg/dL — AB (ref 8.4–10.5)
CO2: 26 mEq/L (ref 19–32)
CREATININE: 0.76 mg/dL (ref 0.50–1.35)
Chloride: 102 mEq/L (ref 96–112)
GFR calc Af Amer: >90 mL/min (ref >90)
GLUCOSE: 131 mg/dL — AB (ref 70–99)
POTASSIUM: 3.5 meq/L — AB (ref 3.7–5.3)
Sodium: 139 mEq/L (ref 137–147)

## 2013-11-04 LAB — CULTURE, RESPIRATORY W GRAM STAIN

## 2013-11-04 LAB — BLOOD GAS, ARTERIAL
ACID-BASE EXCESS: 4.2 mmol/L — AB (ref 0.0–2.0)
Bicarbonate: 27.3 mEq/L — ABNORMAL HIGH (ref 20.0–24.0)
Drawn by: 28340
FIO2: 0.5 %
LHR: 16 {breaths}/min
MECHVT: 620 mL
O2 Saturation: 94.6 %
PCO2 ART: 33.8 mmHg — AB (ref 35.0–45.0)
PEEP/CPAP: 10 cmH2O
Patient temperature: 98.6
TCO2: 28.3 mmol/L (ref 0–100)
pH, Arterial: 7.517 — ABNORMAL HIGH (ref 7.350–7.450)
pO2, Arterial: 65.7 mmHg — ABNORMAL LOW (ref 80.0–100.0)

## 2013-11-04 LAB — MAGNESIUM: Magnesium: 2.1 mg/dL (ref 1.5–2.5)

## 2013-11-04 LAB — CULTURE, RESPIRATORY

## 2013-11-04 MED ORDER — DOCUSATE SODIUM 50 MG/5ML PO LIQD
100.0000 mg | Freq: Every day | ORAL | Status: DC
  Administered 2013-11-04: 100 mg via ORAL
  Filled 2013-11-04 (×4): qty 10

## 2013-11-04 MED ORDER — POTASSIUM CHLORIDE 20 MEQ/15ML (10%) PO LIQD
20.0000 meq | Freq: Once | ORAL | Status: AC
  Administered 2013-11-04: 20 meq via ORAL

## 2013-11-04 MED ORDER — DEXTROSE 10 % IV SOLN
INTRAVENOUS | Status: DC
  Administered 2013-11-04 – 2013-11-05 (×2): via INTRAVENOUS

## 2013-11-04 MED ORDER — VANCOMYCIN HCL IN DEXTROSE 1-5 GM/200ML-% IV SOLN
1000.0000 mg | Freq: Two times a day (BID) | INTRAVENOUS | Status: DC
  Administered 2013-11-04 – 2013-11-05 (×2): 1000 mg via INTRAVENOUS
  Filled 2013-11-04 (×3): qty 200

## 2013-11-04 MED ORDER — FUROSEMIDE 10 MG/ML IJ SOLN
20.0000 mg | Freq: Once | INTRAMUSCULAR | Status: AC
  Administered 2013-11-04: 20 mg via INTRAVENOUS

## 2013-11-04 NOTE — Progress Notes (Signed)
Pt was placed on wean by Valetta Fuller, NP and Dr. Alva Garnet, unaware of what time.

## 2013-11-04 NOTE — Progress Notes (Signed)
Name: NESHAWN AIRD MRN: [id number] DOB: 08-07-1941  ELECTRONIC ICU PHYSICIAN NOTE  Problem:  Sugars trending down / npo  Intervention:  Start d 10 w at 25 cc/h and hold all SSI for now   Amardeep Beckers 11/04/2013, 4:55 PM

## 2013-11-04 NOTE — Procedures (Signed)
Extubation Procedure Note  Patient Details:   Name: Joseph [name] DOB: 04-Sep-1940 MRN: [id number]   Airway Documentation:     Evaluation  O2 sats: stable throughout Complications: No apparent complications Patient did tolerate procedure well. Bilateral Breath Sounds: Clear;Diminished Suctioning: Airway Yes pt able to vocalize.   Pt extubated at this time per Dr. Alva Garnet and placed on 6L [place]. No complications. Pt able to breathe around deflated cuff. VS stable. Pt has strong, adequate cough. No stridor noted. IS performed 1250-1710mLx8. RT will continue to monitor.   Irineo Axon [facility] 11/04/2013, 10:13 AM

## 2013-11-04 NOTE — Progress Notes (Signed)
ANTIBIOTIC CONSULT NOTE  Pharmacy Consult for vancomycin and Zosyn Indication: rule out sepsis  No Known Allergies  Patient Measurements: Height: 6' (182.9 cm) Weight: 249 lb 9 oz (113.2 kg) IBW/kg (Calculated) : 77.6   Vital Signs: Temp: 97.9 F (36.6 C) (03/07 0732) Temp src: Oral (03/07 0732) BP: 99/56 mmHg (03/07 1000) Pulse Rate: 87 (03/07 1005) Intake/Output from previous day: 03/06 0701 - 03/07 0700 In: 2524.8 [I.V.:1884.8; NG/GT:90; IV Piggyback:550] Out: 5800 [Urine:5600; Emesis/NG output:200] Intake/Output from this shift: Total I/O In: 253.7 [I.V.:253.7] Out: 400 [Urine:400]  Labs:  Recent Labs  11/02/13 0405 11/02/13 1655 11/03/13 0341 11/04/13 0405  WBC 13.2*  --  7.0 5.5  HGB 9.6*  --  8.8* 9.1*  PLT 128*  --  80* 96*  CREATININE 0.81 0.73 0.67 0.76   Estimated Creatinine Clearance: 111.6 ml/min (by C-G formula based on Cr of 0.76). No results found for this basename: VANCOTROUGH, Corlis Leak, VANCORANDOM, GENTTROUGH, GENTPEAK, GENTRANDOM, TOBRATROUGH, TOBRAPEAK, TOBRARND, AMIKACINPEAK, AMIKACINTROU, AMIKACIN,  in the last 72 hours   Microbiology: Recent Results (from the past 720 hour(s))  SURGICAL PCR SCREEN     Status: None   Collection Time    10/30/13  2:03 PM      Result Value Ref Range Status   MRSA, PCR NEGATIVE  NEGATIVE Final   Staphylococcus aureus NEGATIVE  NEGATIVE Final   Comment:            The Xpert SA Assay (FDA     approved for NASAL specimens     in patients over 71 years of age),     is one component of     a comprehensive surveillance     program.  Test performance has     been validated by [employer] for patients greater     than or equal to 71 year old.     It is not intended     to diagnose infection nor to     guide or monitor treatment.  CULTURE, BLOOD (ROUTINE X 2)     Status: None   Collection Time    11/02/13 12:15 AM      Result Value Ref Range Status   Specimen Description BLOOD RIGHT ARM   Final   Special Requests BOTTLES DRAWN AEROBIC AND ANAEROBIC 10CC   Final   Culture  Setup Time     Final   Value: 11/02/2013 04:33     Performed at [facility]   Culture     Final   Value:        BLOOD CULTURE RECEIVED NO GROWTH TO DATE CULTURE WILL BE HELD FOR 5 DAYS BEFORE ISSUING A FINAL NEGATIVE REPORT     Performed at [facility]   Report Status PENDING   Incomplete  CULTURE, BLOOD (ROUTINE X 2)     Status: None   Collection Time    11/02/13 12:30 AM      Result Value Ref Range Status   Specimen Description BLOOD RIGHT HAND   Final   Special Requests     Final   Value: BOTTLES DRAWN AEROBIC AND ANAEROBIC [place] AEB,4CC ANA   Culture  Setup Time     Final   Value: 11/02/2013 04:34     Performed at [facility]   Culture     Final   Value:        BLOOD CULTURE RECEIVED NO GROWTH TO DATE CULTURE WILL BE HELD FOR  5 DAYS BEFORE ISSUING A FINAL NEGATIVE REPORT     Performed at [facility]   Report Status PENDING   Incomplete  CULTURE, RESPIRATORY (NON-EXPECTORATED)     Status: None   Collection Time    11/02/13  3:19 AM      Result Value Ref Range Status   Specimen Description TRACHEAL ASPIRATE   Final   Special Requests NONE   Final   Gram Stain     Final   Value: MODERATE WBC PRESENT,BOTH PMN AND MONONUCLEAR     RARE SQUAMOUS EPITHELIAL CELLS PRESENT     NO ORGANISMS SEEN     Performed at [facility]   Culture     Final   Value: Culture reincubated for better growth     Performed at [facility]   Report Status PENDING   Incomplete    Assessment: 73 YOM admitted with AFib in RVR who has developed fever and worsening shock on 11/01/13. He is now afebrile. Blood and respiratory cultures have been sent and are pending. Renal function remains stable.  Goal of Therapy:  Vancomycin trough level 15-20 mcg/ml  Plan:  1. Continue Zosyn 3.375g IV q8h  2. Decrease Vancomycin to 1000mg  IV q12h - will hold off on trough pending results of  respiratory culture. 3. Follow renal function, clinical progression, LOT and trough at [place], PharmD, [facility] Clinical Pharmacist Pager [phone]   11/04/2013 11:35 AM

## 2013-11-04 NOTE — Progress Notes (Signed)
Subjective: Interval History: none.. sedated on vent  Objective: Vital signs in last 24 hours: Temp:  [97.2 F (36.2 C)-98.2 F (36.8 C)] 97.9 F (36.6 C) (03/07 0732) Pulse Rate:  [75-95] 89 (03/07 0732) Resp:  [9-28] 21 (03/07 0732) BP: (75-129)/(37-79) 77/50 mmHg (03/07 0732) SpO2:  [91 %-99 %] 91 % (03/07 0732) Arterial Line BP: (72-135)/(43-70) 102/54 mmHg (03/07 0700) FiO2 (%):  [50 %-70 %] 50 % (03/07 0732) Weight:  [249 lb 9 oz (113.2 kg)] 249 lb 9 oz (113.2 kg) (03/07 0530)  Intake/Output from previous day: 03/06 0701 - 03/07 0700 In: 2524.8 [I.V.:1884.8; NG/GT:90; IV Piggyback:550] Out: 5800 [Urine:5600; Emesis/NG output:200] Intake/Output this shift:    Abdomen soft. Incision healing nicely. Palpable femoral pulses.  Lab Results:  Recent Labs  11/03/13 0341 11/04/13 0405  WBC 7.0 5.5  HGB 8.8* 9.1*  HCT 25.0* 25.8*  PLT 80* 96*   BMET  Recent Labs  11/03/13 0341 11/04/13 0405  NA 134* 139  K 3.5* 3.5*  CL 97 102  CO2 23 26  GLUCOSE 143* 131*  BUN 19 17  CREATININE 0.67 0.76  CALCIUM 7.5* 8.0*    Studies/Results: Dg Chest 2 View  10/30/2013   CLINICAL DATA:  Preoperative films for patient with an abdominal aortic aneurysm.  EXAM: CHEST  2 VIEW  COMPARISON:  CT chest 07/17/2013.  FINDINGS: The lungs are emphysematous with scarring in the right upper lobe, unchanged. No consolidative process, pneumothorax or effusion. Heart size is in place.  IMPRESSION: Emphysema without acute disease.   Electronically Signed   By: Inge Rise M.D.   On: 10/30/2013 16:14   Dg Chest Port 1 View  11/04/2013   CLINICAL DATA:  Pleural effusions.  EXAM: PORTABLE CHEST - 1 VIEW  COMPARISON:  11/03/2013  FINDINGS: the ET tube tip is above the carina. There is a right IJ catheter with tip in the projection of the SVC. The NG tube tip is in the stomach. A left chest wall port a catheter is noted with tip in the projection of the SVC. Normal heart size. No change in  bilateral pleural effusions and low lung volumes.  IMPRESSION: 1. Stable exam. No change in the pleural effusions and low lung volumes.   Electronically Signed   By: Kerby Moors M.D.   On: 11/04/2013 08:15   Dg Chest Port 1 View  11/03/2013   CLINICAL DATA:  Postop for thrombectomy and iliac artery surgery for abdominal aortic aneurysm.  EXAM: PORTABLE CHEST - 1 VIEW  COMPARISON:  DG CHEST 1V PORT dated 11/02/2013  FINDINGS: Endotracheal tube 5.5 cm above carina. Left-sided Port-A-Cath unchanged with tip at high SVC. Right internal jugular line unchanged with tip also at high SVC. Nasogastric tube extends beyond the inferior aspect of the film. Cardiomegaly with a tortuous thoracic aorta. Layering small left pleural effusion is unchanged. Improved to resolved right-sided pleural effusion. No pneumothorax. Minimal improvement in mild interstitial edema. Similar left and improved right base airspace disease. Right apical bullous emphysema.  IMPRESSION: Slight improvement in aeration with decreased interstitial edema and right base airspace disease/ pleural fluid.  Persistent left base airspace disease and adjacent pleural fluid.   Electronically Signed   By: Abigail Miyamoto M.D.   On: 11/03/2013 08:09   Portable Chest Xray In Am  11/02/2013   CLINICAL DATA:  Intubated  EXAM: PORTABLE CHEST - 1 VIEW  COMPARISON:  DG CHEST 1V PORT dated 11/01/2013; DG CHEST 1V PORT dated 11/01/2013; DG CHEST  1V PORT dated 11/01/2013; DG CHEST 1V PORT dated 11/01/2013; CT CHEST W/CM dated 03/06/2013; CT CHEST W/CM dated 07/17/2013  FINDINGS: Endotracheal tube 3.2 cm from carinal. Left port and right central venous line are unchanged. There are low lung volumes and basilar atelectasis. Small effusions. Bullous change and scarring in the right upper lobe is noted. No pneumothorax evident.  IMPRESSION: 1. Stable support apparatus. 2. Bibasilar atelectasis and effusions. 3. Stable bullous change in scarring in the right upper lobe.   Electronically  Signed   By: Suzy Bouchard M.D.   On: 11/02/2013 08:33   Dg Chest Port 1 View  11/02/2013   CLINICAL DATA:  Endotracheal tube placement  EXAM: PORTABLE CHEST - 1 VIEW  COMPARISON:  11/02/2011  FINDINGS: Endotracheal tube in good position. NG tube enters the stomach. Port-A-Cath tip in the SVC is unchanged.  Right jugular catheter tip in the right innominate vein.  Interval increase in bibasilar airspace disease. Small bilateral effusions also increased.  Underlying chronic lung disease and scarring especially the right upper lobe.  IMPRESSION: Progression of bibasilar consolidation and effusions. Question fluid overload   Electronically Signed   By: Franchot Gallo M.D.   On: 11/02/2013 07:06   Dg Chest Port 1 View  11/01/2013   CLINICAL DATA:  LINE PLACEMENT  EXAM: PORTABLE CHEST - 1 VIEW  COMPARISON:  DG CHEST 1V PORT dated 11/01/2013  FINDINGS: A right internal jugular sheath and central venous catheter is appreciated the central venous catheter tip projects the level of the superior vena cava. A left-sided port catheter is appreciated with tip projecting in the region of superior vena cava. Endotracheal tube is identified with tip approximately 3 cm above the carina. A NG tube is seen with tip 11 review this study. Low lung volumes. There is diffuse thickening of the interstitial markings. Areas of increased density projects within the lung bases. Is blunting of left costophrenic angle. Cardiac silhouette is mildly enlarged. Aorta is tortuous and ectatic. Atherosclerotic calcifications identified. The osseous structures unremarkable.  IMPRESSION: Support lines and tubes which appear to be adequately positioned. Otherwise no significant change in chest radiograph. There is no evidence of pneumothorax.   Electronically Signed   By: Margaree Mackintosh M.D.   On: 11/01/2013 23:35   Dg Chest Port 1 View  11/01/2013   CLINICAL DATA:  Respiratory distress.  EXAM: PORTABLE CHEST - 1 VIEW  COMPARISON:  11/01/2013   FINDINGS: The endotracheal tube has been removed. Left subclavian Port-A-Cath tip in the upper SVC region. Nasogastric tube extends into the stomach. Right jugular central line is still present. The Swan-Ganz catheter has been removed. Low lung volumes with patchy interstitial densities. Findings are suggestive for atelectasis. Again noted are underlying emphysematous changes.  IMPRESSION: Low lung volumes with persistent basilar densities. Findings are most compatible with atelectasis.  Support apparatuses as described.   Electronically Signed   By: Markus Daft M.D.   On: 11/01/2013 17:03   Dg Chest Portable 1 View  11/01/2013   CLINICAL DATA:  Post vascular surgery.  EXAM: PORTABLE CHEST - 1 VIEW  COMPARISON:  10/31/2013  FINDINGS: Endotracheal tube is 3.9 cm above the carina. There is a left subclavian Port-A-Cath with the tip near the junction of the SVC and azygos. Port-A-Cath tip position is unchanged. Swan-Ganz catheter tip is in the pulmonary outflow tract region. Hazy densities at the lung bases may represent atelectasis. Difficult to exclude small effusions. Severe emphysematous changes in the upper lungs. Chronic scarring  in the medial right upper lung. Heart size is normal. No evidence for a pneumothorax.  IMPRESSION: Low lung volumes with bibasilar atelectasis.  Support apparatuses as described.   Electronically Signed   By: Markus Daft M.D.   On: 11/01/2013 07:52   Dg Chest Portable 1 View  10/31/2013   CLINICAL DATA:  Aortic aneurysm repair, postop  EXAM: PORTABLE CHEST - 1 VIEW  COMPARISON:  the previous day's study  FINDINGS: An endotracheal tube tip is at the thoracic inlet. Right IJ Swan-Ganz extends to the pulmonary outflow tract. Nasogastric tube at least as far as the stomach, tip not seen. Left subclavian port catheter to the proximal SVC. Linear scarring/ atelectasis at the right lung apex stable. Low lung volumes. Perihilar and bibasilar interstitial edema has increased. There is some  patchy airspace consolidation at the left lung base. Possible small left effusion. .  IMPRESSION: 1. High position of endotracheal tube, tip of the thoracic inlet. 2. Increase in bibasilar interstitial edema or infiltrates with possible small left effusion.   Electronically Signed   By: Arne Cleveland M.D.   On: 10/31/2013 18:44   Dg Chest Port 1 View  10/31/2013   CLINICAL DATA:  Reposition endotracheal tube  EXAM: PORTABLE CHEST - 1 VIEW  COMPARISON:  Earlier film of the same day  FINDINGS: The endotracheal tube tip is now 5.1 cm above carina. Nasogastric tube, right IJ Swan-Ganz catheter, and left subclavian port catheter stable in position. Hazy increase in opacity at the left lung base without obscuration of the lateral costophrenic angle suggesting layering effusion. Coarse linear scarring or atelectasis in the right lung apex stable. Bibasilar interstitial edema or infiltrates. Relatively low lung volumes. .  IMPRESSION: 1. Repositioned endotracheal tube, tip 5.1 cm above carina. 2. Persistent bibasilar interstitial edema/infiltrates with possible layering left pleural effusion.   Electronically Signed   By: Arne Cleveland M.D.   On: 10/31/2013 18:36   Anti-infectives: Anti-infectives   Start     Dose/Rate Route Frequency Ordered Stop   11/02/13 0600  piperacillin-tazobactam (ZOSYN) IVPB 3.375 g     3.375 g 12.5 mL/hr over 240 Minutes Intravenous Every 8 hours 11/01/13 2304     11/01/13 2315  vancomycin (VANCOCIN) IVPB 1000 mg/200 mL premix     1,000 mg 200 mL/hr over 60 Minutes Intravenous 3 times per day 11/01/13 2304     11/01/13 2315  piperacillin-tazobactam (ZOSYN) IVPB 3.375 g     3.375 g 100 mL/hr over 30 Minutes Intravenous  Once 11/01/13 2304 11/02/13 0051   10/31/13 1800  cefUROXime (ZINACEF) 1.5 g in dextrose 5 % 50 mL IVPB     1.5 g 100 mL/hr over 30 Minutes Intravenous Every 12 hours 10/31/13 1738 11/01/13 0617   10/31/13 1615  cefUROXime (ZINACEF) 1.5 g in dextrose 5 % 50 mL  IVPB  Status:  Discontinued     1.5 g 100 mL/hr over 30 Minutes Intravenous To Surgery 10/31/13 1611 10/31/13 1619   10/30/13 1252  cefUROXime (ZINACEF) 1.5 g in dextrose 5 % 50 mL IVPB  Status:  Discontinued     1.5 g 100 mL/hr over 30 Minutes Intravenous 30 min pre-op 10/30/13 1252 10/31/13 1619      Assessment/Plan: s/p Procedure(s): aorto to left external iliac and right external iliac and left internal iliac, reimplantation of inferior mesenteric artery and ligation of left iliac artery aneursym (N/A) THROMBECTOMY ILIAC ARTERY (Bilateral) Remains hemodynamically stable. Vent per critical care medicine. Had 4 L diuresis yesterday.  LOS: 4 days   EARLY, TODD 11/04/2013, 8:50 AM

## 2013-11-04 NOTE — Progress Notes (Signed)
Name: Joseph [name] MRN: [id number] DOB: September 26, 1940    ADMISSION DATE:  10/31/2013 CONSULTATION DATE:  10/31/2013  REFERRING MD :  Oneida Alar   CHIEF COMPLAINT:  Post-op vent/critical care   BRIEF PATIENT DESCRIPTION:  This is a 73 year old male w/ several medical co-morbids: COPD, H/o lung cancer (observation since May of 2013 with no evidence for disease progression), and cirrhosis. Admitted on 3/3 for elective extensive AAA repair (see below). PCCM asked to see post-op to assist w/ post op care.   SIGNIFICANT EVENTS:  3/03 Aorto to Lt/Rt external iliac bypass, lt internal iliac bypass, ligation Rt internal iliac aneurysm, re-implantation inferior mesenteric artery, thrombectomy Lt/Rt iliac and femoral arteries 3/04 Extubated >> respiratory distress, A fib RVR, hypotension, fever >> pressors, intubated, Abx, solucortef, amiodarone 3/05 Converted to sinus rhythm  STUDIES:   LINES / TUBES: ETT 3/03 >>> 3/3, 3/4>>>3/7 Right PAC 3/3 >>>3/04 Aline left rad 3/3 >>> Rt IJ CVL 3/04 >>>  CULTURES: Blood 3/04 >>> Sputum 3/04 >>>  ANTIBIOTICS: zinacef 3/3 >>> 3/04 Vancomycin 3/04 >>> Zosyn 3/04 >>>  SUBJECTIVE:  Off pressors.  Awake, alert.  Looks great on SBT.  Denies SOB, pain.   VITAL SIGNS: Temp:  [97.2 F (36.2 C)-98.2 F (36.8 C)] 97.9 F (36.6 C) (03/07 0732) Pulse Rate:  [75-95] 87 (03/07 0900) Resp:  [9-28] 23 (03/07 0900) BP: (75-122)/(37-79) 83/53 mmHg (03/07 0900) SpO2:  [91 %-99 %] 92 % (03/07 0900) Arterial Line BP: (72-132)/(43-69) 86/50 mmHg (03/07 0900) FiO2 (%):  [50 %-70 %] 50 % (03/07 0800) Weight:  [249 lb 9 oz (113.2 kg)] 249 lb 9 oz (113.2 kg) (03/07 0530) HEMODYNAMICS: CVP:  [7 mmHg-16 mmHg] 14 mmHg VENTILATOR SETTINGS: Vent Mode:  [-] PRVC FiO2 (%):  [50 %-70 %] 50 % Set Rate:  [16 bmp] 16 bmp Vt Set:  [620 mL] 620 mL PEEP:  [10 cmH20] 10 cmH20 Plateau Pressure:  [19 cmH20-24 cmH20] 19 cmH20 INTAKE / OUTPUT: Intake/Output     03/06  0701 - 03/07 0700 03/07 0701 - 03/08 0700   I.V. (mL/kg) 1884.8 (16.7) 172.4 (1.5)   NG/GT 90    IV Piggyback 550    Total Intake(mL/kg) 2524.8 (22.3) 172.4 (1.5)   Urine (mL/kg/hr) 5600 (2.1) 100 (0.3)   Emesis/NG output 200 (0.1)    Total Output 5800 100   Net -3275.2 +72.4          PHYSICAL EXAMINATION: General:  No distress Neuro:awake, alert, appropriate, follows commands  HEENT:  Orally intubated  Cardiovascular: regular, murmur Lungs: resps even non labored on PS 5/5, Vt >1500, no wheeze, scattered rhonchi Abdomen:  Soft, midline incision with staples, clean Musculoskeletal: 1+ edema Skin: no rashes  LABS:  CBC  Recent Labs Lab 11/02/13 0405 11/03/13 0341 11/04/13 0405  WBC 13.2* 7.0 5.5  HGB 9.6* 8.8* 9.1*  HCT 27.1* 25.0* 25.8*  PLT 128* 80* 96*   Coag's  Recent Labs Lab 10/31/13 2238 11/01/13 1010 11/01/13 1706  APTT 33 30 33  INR 1.32 1.37 1.29   BMET  Recent Labs Lab 11/02/13 1655 11/03/13 0341 11/04/13 0405  NA 133* 134* 139  K 3.6* 3.5* 3.5*  CL 97 97 102  CO2 24 23 26   BUN 14 19 17   CREATININE 0.73 0.67 0.76  GLUCOSE 125* 143* 131*   Electrolytes  Recent Labs Lab 11/01/13 1810  11/02/13 1655 11/03/13 0341 11/04/13 0405  CALCIUM 7.5*  < > 7.5* 7.5* 8.0*  MG 1.4*  --  1.7  --  2.1  < > = values in this interval not displayed. ABG  Recent Labs Lab 11/02/13 0954 11/03/13 0356 11/04/13 0325  PHART 7.390 7.446 7.517*  PCO2ART 40.5 36.5 33.8*  PO2ART 60.0* 71.1* 65.7*   Liver Enzymes  Recent Labs Lab 10/30/13 1407 11/01/13 0345  AST 34 29  ALT 29 13  ALKPHOS 154* 44  BILITOT 1.0 1.9*  ALBUMIN 3.4* 2.6*   Cardiac Enzymes  Recent Labs Lab 11/01/13 1810 11/01/13 2105 11/02/13 0025 11/02/13 0905  TROPONINI <0.30 <0.30  --  <0.30  PROBNP  --   --  602.3*  --    Glucose  Recent Labs Lab 11/03/13 0735 11/03/13 1123 11/03/13 1612 11/03/13 1955 11/03/13 2338 11/04/13 0351  GLUCAP 154* 128* 104* 100*  101* 105*    Imaging Dg Chest Port 1 View  11/04/2013   CLINICAL DATA:  Pleural effusions.  EXAM: PORTABLE CHEST - 1 VIEW  COMPARISON:  11/03/2013  FINDINGS: the ET tube tip is above the carina. There is a right IJ catheter with tip in the projection of the SVC. The NG tube tip is in the stomach. A left chest wall port a catheter is noted with tip in the projection of the SVC. Normal heart size. No change in bilateral pleural effusions and low lung volumes.  IMPRESSION: 1. Stable exam. No change in the pleural effusions and low lung volumes.   Electronically Signed   By: Kerby Moors M.D.   On: 11/04/2013 08:15   Dg Chest Port 1 View  11/03/2013   CLINICAL DATA:  Postop for thrombectomy and iliac artery surgery for abdominal aortic aneurysm.  EXAM: PORTABLE CHEST - 1 VIEW  COMPARISON:  DG CHEST 1V PORT dated 11/02/2013  FINDINGS: Endotracheal tube 5.5 cm above carina. Left-sided Port-A-Cath unchanged with tip at high SVC. Right internal jugular line unchanged with tip also at high SVC. Nasogastric tube extends beyond the inferior aspect of the film. Cardiomegaly with a tortuous thoracic aorta. Layering small left pleural effusion is unchanged. Improved to resolved right-sided pleural effusion. No pneumothorax. Minimal improvement in mild interstitial edema. Similar left and improved right base airspace disease. Right apical bullous emphysema.  IMPRESSION: Slight improvement in aeration with decreased interstitial edema and right base airspace disease/ pleural fluid.  Persistent left base airspace disease and adjacent pleural fluid.   Electronically Signed   By: Abigail Miyamoto M.D.   On: 11/03/2013 08:09   ASSESSMENT / PLAN:  PULMONARY A: Post-op ventilator dependence. Acute respiratory failure 3/04 2nd to shock, atelectasis, A fib. H/o COPD/emphysema, lung ca. P:   Extubate - bipap PRN post extubation  F/u CXR Continue duoneb Aggressive pulmonary hygiene  Continue gentle diuresis    CARDIOVASCULAR A:  AAA repair  Post op Hypovolemic/ hemorrhagic shock. A fib with RVR 3/04 >> sinus rhythm 3/05. Hx of hyperlipidemia, CAD. P:  Continue lasix BID - additional 20mg  3/6 Continue solucortef for now - d/c 38  RENAL A:   Hypokalemia P:   Monitor renal fx, urine outpt, electrolytes  GASTROINTESTINAL A:   Hx of Cirrhosis. Nutrition.  P:   NPO  Protonix for SUP  HEMATOLOGIC A:   Acute blood loss anemia (post-op) >> EBL 8.5 liters. Mild coagulopathy (in setting of underlying liver disease and multiple blood products). Thrombocytopenia (consumptive, dilution). P: F/u CBC SCD's  INFECTIOUS A:   Fever 3/04.  P:   Day 4 vancomycin, zosyn >> narrow abx once cx results available  ENDOCRINE A:  Hx of hypothyroidism. Mild hyperglycemia . P:   Levothyroxine 87.5 mcg IV (1/2 home dose) SSI  NEUROLOGIC A:   Post-op pain control. P:   PRN pain rx     WHITEHEART,KATHRYN, NP 11/04/2013  9:55 AM Pager: [phone] or [phone]  *Care during the described time interval was provided by me and/or other providers on the critical care team. I have reviewed this patient's available data, including medical history, events of note, physical examination and test results as part of my evaluation.    PCCM ATTENDING: I have interviewed and examined the patient and reviewed the database. I have formulated the assessment and plan as reflected in the note above with amendments made by me. 30 mins of direct critical care time provided  Post ext check: looks good  Merton Border, MD;  PCCM service; Mobile [phone]

## 2013-11-05 ENCOUNTER — Inpatient Hospital Stay (HOSPITAL_COMMUNITY): Payer: Medicare Other

## 2013-11-05 DIAGNOSIS — R197 Diarrhea, unspecified: Secondary | ICD-10-CM | POA: Diagnosis present

## 2013-11-05 LAB — POCT I-STAT, CHEM 8
BUN: 15 mg/dL (ref 6–23)
CALCIUM ION: 1.06 mmol/L — AB (ref 1.13–1.30)
Chloride: 103 mEq/L (ref 96–112)
Creatinine, Ser: 1 mg/dL (ref 0.50–1.35)
Glucose, Bld: 99 mg/dL (ref 70–99)
HEMATOCRIT: 32 % — AB (ref 39.0–52.0)
HEMOGLOBIN: 10.9 g/dL — AB (ref 13.0–17.0)
Potassium: 2.6 mEq/L — CL (ref 3.7–5.3)
Sodium: 144 mEq/L (ref 137–147)
TCO2: 26 mmol/L (ref 0–100)

## 2013-11-05 LAB — CBC
HCT: 28.9 % — ABNORMAL LOW (ref 39.0–52.0)
HEMOGLOBIN: 10 g/dL — AB (ref 13.0–17.0)
MCH: 31.9 pg (ref 26.0–34.0)
MCHC: 34.6 g/dL (ref 30.0–36.0)
MCV: 92.3 fL (ref 78.0–100.0)
PLATELETS: 111 10*3/uL — AB (ref 150–400)
RBC: 3.13 MIL/uL — AB (ref 4.22–5.81)
RDW: 15.4 % (ref 11.5–15.5)
WBC: 7.5 10*3/uL (ref 4.0–10.5)

## 2013-11-05 LAB — BASIC METABOLIC PANEL
BUN: 19 mg/dL (ref 6–23)
CHLORIDE: 102 meq/L (ref 96–112)
CO2: 28 mEq/L (ref 19–32)
CREATININE: 0.93 mg/dL (ref 0.50–1.35)
Calcium: 7.9 mg/dL — ABNORMAL LOW (ref 8.4–10.5)
GFR calc Af Amer: >90 mL/min (ref >90)
GFR calc non Af Amer: 83 mL/min — ABNORMAL LOW (ref >90)
Glucose, Bld: 108 mg/dL — ABNORMAL HIGH (ref 70–99)
Potassium: 2.6 mEq/L — CL (ref 3.7–5.3)
SODIUM: 142 meq/L (ref 137–147)

## 2013-11-05 LAB — GLUCOSE, CAPILLARY
GLUCOSE-CAPILLARY: 105 mg/dL — AB (ref 70–99)
GLUCOSE-CAPILLARY: 109 mg/dL — AB (ref 70–99)
GLUCOSE-CAPILLARY: 99 mg/dL (ref 70–99)
GLUCOSE-CAPILLARY: 108 mg/dL — AB (ref 70–99)
Glucose-Capillary: 82 mg/dL (ref 70–99)
Glucose-Capillary: 98 mg/dL (ref 70–99)

## 2013-11-05 LAB — PHOSPHORUS: Phosphorus: 2 mg/dL — ABNORMAL LOW (ref 2.3–4.6)

## 2013-11-05 LAB — MAGNESIUM: Magnesium: 1.8 mg/dL (ref 1.5–2.5)

## 2013-11-05 LAB — CLOSTRIDIUM DIFFICILE BY PCR: CDIFFPCR: NEGATIVE

## 2013-11-05 MED ORDER — POTASSIUM CHLORIDE 20 MEQ/15ML (10%) PO LIQD
40.0000 meq | Freq: Once | ORAL | Status: AC
  Administered 2013-11-05: 40 meq
  Filled 2013-11-05: qty 30

## 2013-11-05 MED ORDER — POTASSIUM CHLORIDE 10 MEQ/50ML IV SOLN
10.0000 meq | INTRAVENOUS | Status: AC
  Administered 2013-11-05 (×6): 10 meq via INTRAVENOUS
  Filled 2013-11-05: qty 50

## 2013-11-05 MED ORDER — POTASSIUM PHOSPHATE DIBASIC 3 MMOLE/ML IV SOLN
30.0000 mmol | Freq: Once | INTRAVENOUS | Status: AC
  Administered 2013-11-05: 30 mmol via INTRAVENOUS
  Filled 2013-11-05 (×2): qty 10

## 2013-11-05 MED ORDER — POTASSIUM CHLORIDE 10 MEQ/50ML IV SOLN
10.0000 meq | INTRAVENOUS | Status: AC
  Administered 2013-11-05 (×3): 10 meq via INTRAVENOUS

## 2013-11-05 MED ORDER — POTASSIUM CHLORIDE 10 MEQ/50ML IV SOLN
10.0000 meq | INTRAVENOUS | Status: DC
  Administered 2013-11-05: 10 meq via INTRAVENOUS
  Filled 2013-11-05: qty 50

## 2013-11-05 NOTE — Progress Notes (Signed)
CRITICAL VALUE ALERT  Critical value received:  K 2.6  Date of notification:  11-05-13  Time of notification:  1165  Critical value read back:yes  Nurse who received alert:  Rolla Plate   MD notified (1st page):  Warren Lacy MD [place]  Time of first page:  [phone]  MD notified (2nd page):  Time of second page:  Responding MD:  E [place]  Time MD responded:  [phone]

## 2013-11-05 NOTE — Progress Notes (Signed)
[facility] Progress Note Patient Name: Joseph [name] DOB: 1940/09/07 MRN: [id number]  Date of Service  11/05/2013   HPI/Events of Note  Hypokalemia   eICU Interventions  Potassium replaced   Intervention Category Intermediate Interventions: Electrolyte abnormality - evaluation and management  [name] 11/05/2013, 5:11 AM

## 2013-11-05 NOTE — Progress Notes (Signed)
Subjective: Interval History: none.. now extubated. Only complains of dry throat. Denies abdominal pain. Now having watery diarrhea.  Objective: Vital signs in last 24 hours: Temp:  [97.6 F (36.4 C)-100 F (37.8 C)] 98.6 F (37 C) (03/08 0736) Pulse Rate:  [87-123] 107 (03/08 1000) Resp:  [11-22] 19 (03/08 1000) BP: (90-156)/(58-88) 137/88 mmHg (03/08 1000) SpO2:  [87 %-99 %] 92 % (03/08 1000) Arterial Line BP: (100-103)/(50-55) 100/50 mmHg (03/07 1200) FiO2 (%):  [55 %-100 %] 90 % (03/08 0828) Weight:  [244 lb 11.4 oz (111 kg)] 244 lb 11.4 oz (111 kg) (03/08 0500)  Intake/Output from previous day: 03/07 0701 - 03/08 0700 In: 1927 [I.V.:1217; NG/GT:60; IV Piggyback:650] Out: 6578 [Urine:3507; Stool:8] Intake/Output this shift: Total I/O In: 245.1 [I.V.:95.1; IV Piggyback:150] Out: 4696 [Urine:1254]  Abdominal wound healing well. Incision looks good. No abdominal soreness or rebound. Palpable pedal pulses.  Lab Results:  Recent Labs  11/04/13 0405 11/05/13 0403  WBC 5.5 7.5  HGB 9.1* 10.0*  HCT 25.8* 28.9*  PLT 96* 111*   BMET  Recent Labs  11/04/13 0405 11/05/13 0403  NA 139 142  K 3.5* 2.6*  CL 102 102  CO2 26 28  GLUCOSE 131* 108*  BUN 17 19  CREATININE 0.76 0.93  CALCIUM 8.0* 7.9*    Studies/Results: Dg Chest 2 View  10/30/2013   CLINICAL DATA:  Preoperative films for patient with an abdominal aortic aneurysm.  EXAM: CHEST  2 VIEW  COMPARISON:  CT chest 07/17/2013.  FINDINGS: The lungs are emphysematous with scarring in the right upper lobe, unchanged. No consolidative process, pneumothorax or effusion. Heart size is in place.  IMPRESSION: Emphysema without acute disease.   Electronically Signed   By: Inge Rise M.D.   On: 10/30/2013 16:14   Dg Chest Portable 1 View  11/05/2013   CLINICAL DATA:  Pulmonary edema.  EXAM: PORTABLE CHEST - 1 VIEW  COMPARISON:  Chest x-ray 11/04/2013.  FINDINGS: There is a right-sided internal jugular central venous  catheter with tip terminating in the distal superior vena cava. Left subclavian single-lumen porta cath with tip terminating in the distal superior vena cava. Endotracheal tube has been removed. A nasogastric tube is seen extending into the stomach, however, the tip of the nasogastric tube extends below the lower margin of the image. Lung volumes are low. Bibasilar opacities (left greater than right), favored to reflect predominantly subsegmental atelectasis. Small left pleural effusion. No evidence of pulmonary edema. Heart size is normal. Mediastinal contours are unremarkable. Atherosclerosis in the thoracic aorta. Emphysematous changes are again noted throughout the apices of the lungs bilaterally.  IMPRESSION: 1. Support apparatus, as above. 2. Low lung volumes with small left pleural effusion and probable subsegmental atelectasis in the lung bases bilaterally (left greater than right). 3. Atherosclerosis. 4. Emphysema.   Electronically Signed   By: Vinnie Langton M.D.   On: 11/05/2013 09:21   Dg Chest Port 1 View  11/04/2013   CLINICAL DATA:  Pleural effusions.  EXAM: PORTABLE CHEST - 1 VIEW  COMPARISON:  11/03/2013  FINDINGS: the ET tube tip is above the carina. There is a right IJ catheter with tip in the projection of the SVC. The NG tube tip is in the stomach. A left chest wall port a catheter is noted with tip in the projection of the SVC. Normal heart size. No change in bilateral pleural effusions and low lung volumes.  IMPRESSION: 1. Stable exam. No change in the pleural effusions and low lung  volumes.   Electronically Signed   By: Kerby Moors M.D.   On: 11/04/2013 08:15   Dg Chest Port 1 View  11/03/2013   CLINICAL DATA:  Postop for thrombectomy and iliac artery surgery for abdominal aortic aneurysm.  EXAM: PORTABLE CHEST - 1 VIEW  COMPARISON:  DG CHEST 1V PORT dated 11/02/2013  FINDINGS: Endotracheal tube 5.5 cm above carina. Left-sided Port-A-Cath unchanged with tip at high SVC. Right internal  jugular line unchanged with tip also at high SVC. Nasogastric tube extends beyond the inferior aspect of the film. Cardiomegaly with a tortuous thoracic aorta. Layering small left pleural effusion is unchanged. Improved to resolved right-sided pleural effusion. No pneumothorax. Minimal improvement in mild interstitial edema. Similar left and improved right base airspace disease. Right apical bullous emphysema.  IMPRESSION: Slight improvement in aeration with decreased interstitial edema and right base airspace disease/ pleural fluid.  Persistent left base airspace disease and adjacent pleural fluid.   Electronically Signed   By: Abigail Miyamoto M.D.   On: 11/03/2013 08:09   Portable Chest Xray In Am  11/02/2013   CLINICAL DATA:  Intubated  EXAM: PORTABLE CHEST - 1 VIEW  COMPARISON:  DG CHEST 1V PORT dated 11/01/2013; DG CHEST 1V PORT dated 11/01/2013; DG CHEST 1V PORT dated 11/01/2013; DG CHEST 1V PORT dated 11/01/2013; CT CHEST W/CM dated 03/06/2013; CT CHEST W/CM dated 07/17/2013  FINDINGS: Endotracheal tube 3.2 cm from carinal. Left port and right central venous line are unchanged. There are low lung volumes and basilar atelectasis. Small effusions. Bullous change and scarring in the right upper lobe is noted. No pneumothorax evident.  IMPRESSION: 1. Stable support apparatus. 2. Bibasilar atelectasis and effusions. 3. Stable bullous change in scarring in the right upper lobe.   Electronically Signed   By: Suzy Bouchard M.D.   On: 11/02/2013 08:33   Dg Chest Port 1 View  11/02/2013   CLINICAL DATA:  Endotracheal tube placement  EXAM: PORTABLE CHEST - 1 VIEW  COMPARISON:  11/02/2011  FINDINGS: Endotracheal tube in good position. NG tube enters the stomach. Port-A-Cath tip in the SVC is unchanged.  Right jugular catheter tip in the right innominate vein.  Interval increase in bibasilar airspace disease. Small bilateral effusions also increased.  Underlying chronic lung disease and scarring especially the right upper lobe.   IMPRESSION: Progression of bibasilar consolidation and effusions. Question fluid overload   Electronically Signed   By: Franchot Gallo M.D.   On: 11/02/2013 07:06   Dg Chest Port 1 View  11/01/2013   CLINICAL DATA:  LINE PLACEMENT  EXAM: PORTABLE CHEST - 1 VIEW  COMPARISON:  DG CHEST 1V PORT dated 11/01/2013  FINDINGS: A right internal jugular sheath and central venous catheter is appreciated the central venous catheter tip projects the level of the superior vena cava. A left-sided port catheter is appreciated with tip projecting in the region of superior vena cava. Endotracheal tube is identified with tip approximately 3 cm above the carina. A NG tube is seen with tip 11 review this study. Low lung volumes. There is diffuse thickening of the interstitial markings. Areas of increased density projects within the lung bases. Is blunting of left costophrenic angle. Cardiac silhouette is mildly enlarged. Aorta is tortuous and ectatic. Atherosclerotic calcifications identified. The osseous structures unremarkable.  IMPRESSION: Support lines and tubes which appear to be adequately positioned. Otherwise no significant change in chest radiograph. There is no evidence of pneumothorax.   Electronically Signed   By: Margaree Mackintosh  M.D.   On: 11/01/2013 23:35   Dg Chest Port 1 View  11/01/2013   CLINICAL DATA:  Respiratory distress.  EXAM: PORTABLE CHEST - 1 VIEW  COMPARISON:  11/01/2013  FINDINGS: The endotracheal tube has been removed. Left subclavian Port-A-Cath tip in the upper SVC region. Nasogastric tube extends into the stomach. Right jugular central line is still present. The Swan-Ganz catheter has been removed. Low lung volumes with patchy interstitial densities. Findings are suggestive for atelectasis. Again noted are underlying emphysematous changes.  IMPRESSION: Low lung volumes with persistent basilar densities. Findings are most compatible with atelectasis.  Support apparatuses as described.   Electronically  Signed   By: Markus Daft M.D.   On: 11/01/2013 17:03   Dg Chest Portable 1 View  11/01/2013   CLINICAL DATA:  Post vascular surgery.  EXAM: PORTABLE CHEST - 1 VIEW  COMPARISON:  10/31/2013  FINDINGS: Endotracheal tube is 3.9 cm above the carina. There is a left subclavian Port-A-Cath with the tip near the junction of the SVC and azygos. Port-A-Cath tip position is unchanged. Swan-Ganz catheter tip is in the pulmonary outflow tract region. Hazy densities at the lung bases may represent atelectasis. Difficult to exclude small effusions. Severe emphysematous changes in the upper lungs. Chronic scarring in the medial right upper lung. Heart size is normal. No evidence for a pneumothorax.  IMPRESSION: Low lung volumes with bibasilar atelectasis.  Support apparatuses as described.   Electronically Signed   By: Markus Daft M.D.   On: 11/01/2013 07:52   Dg Chest Portable 1 View  10/31/2013   CLINICAL DATA:  Aortic aneurysm repair, postop  EXAM: PORTABLE CHEST - 1 VIEW  COMPARISON:  the previous day's study  FINDINGS: An endotracheal tube tip is at the thoracic inlet. Right IJ Swan-Ganz extends to the pulmonary outflow tract. Nasogastric tube at least as far as the stomach, tip not seen. Left subclavian port catheter to the proximal SVC. Linear scarring/ atelectasis at the right lung apex stable. Low lung volumes. Perihilar and bibasilar interstitial edema has increased. There is some patchy airspace consolidation at the left lung base. Possible small left effusion. .  IMPRESSION: 1. High position of endotracheal tube, tip of the thoracic inlet. 2. Increase in bibasilar interstitial edema or infiltrates with possible small left effusion.   Electronically Signed   By: Arne Cleveland M.D.   On: 10/31/2013 18:44   Dg Chest Port 1 View  10/31/2013   CLINICAL DATA:  Reposition endotracheal tube  EXAM: PORTABLE CHEST - 1 VIEW  COMPARISON:  Earlier film of the same day  FINDINGS: The endotracheal tube tip is now 5.1 cm above  carina. Nasogastric tube, right IJ Swan-Ganz catheter, and left subclavian port catheter stable in position. Hazy increase in opacity at the left lung base without obscuration of the lateral costophrenic angle suggesting layering effusion. Coarse linear scarring or atelectasis in the right lung apex stable. Bibasilar interstitial edema or infiltrates. Relatively low lung volumes. .  IMPRESSION: 1. Repositioned endotracheal tube, tip 5.1 cm above carina. 2. Persistent bibasilar interstitial edema/infiltrates with possible layering left pleural effusion.   Electronically Signed   By: Arne Cleveland M.D.   On: 10/31/2013 18:36   Anti-infectives: Anti-infectives   Start     Dose/Rate Route Frequency Ordered Stop   11/04/13 1800  vancomycin (VANCOCIN) IVPB 1000 mg/200 mL premix     1,000 mg 200 mL/hr over 60 Minutes Intravenous Every 12 hours 11/04/13 1135     11/02/13 0600  piperacillin-tazobactam (ZOSYN) IVPB 3.375 g     3.375 g 12.5 mL/hr over 240 Minutes Intravenous Every 8 hours 11/01/13 2304     11/01/13 2315  vancomycin (VANCOCIN) IVPB 1000 mg/200 mL premix  Status:  Discontinued     1,000 mg 200 mL/hr over 60 Minutes Intravenous 3 times per day 11/01/13 2304 11/04/13 1135   11/01/13 2315  piperacillin-tazobactam (ZOSYN) IVPB 3.375 g     3.375 g 100 mL/hr over 30 Minutes Intravenous  Once 11/01/13 2304 11/02/13 0051   10/31/13 1800  cefUROXime (ZINACEF) 1.5 g in dextrose 5 % 50 mL IVPB     1.5 g 100 mL/hr over 30 Minutes Intravenous Every 12 hours 10/31/13 1738 11/01/13 0617   10/31/13 1615  cefUROXime (ZINACEF) 1.5 g in dextrose 5 % 50 mL IVPB  Status:  Discontinued     1.5 g 100 mL/hr over 30 Minutes Intravenous To Surgery 10/31/13 1611 10/31/13 1619   10/30/13 1252  cefUROXime (ZINACEF) 1.5 g in dextrose 5 % 50 mL IVPB  Status:  Discontinued     1.5 g 100 mL/hr over 30 Minutes Intravenous 30 min pre-op 10/30/13 1252 10/31/13 1619      Assessment/Plan: s/p Procedure(s): aorto to  left external iliac and right external iliac and left internal iliac, reimplantation of inferior mesenteric artery and ligation of left iliac artery aneursym (N/A) THROMBECTOMY ILIAC ARTERY (Bilateral) Stable now extubated. Continued good diuresis. Comfortable. Will check stool for C. difficile. At that replacement of potassium.   LOS: 5 days   Szymon Foiles 11/05/2013, 10:12 AM

## 2013-11-05 NOTE — Progress Notes (Signed)
Name: Joseph [name] MRN: [id number] DOB: 1940-09-11    ADMISSION DATE:  10/31/2013 CONSULTATION DATE:  10/31/2013  REFERRING MD :  Oneida Alar   CHIEF COMPLAINT:  Post-op vent/critical care   BRIEF PATIENT DESCRIPTION:  This is a 73 year old male w/ several medical co-morbids: COPD, H/o lung cancer (observation since May of 2013 with no evidence for disease progression), and cirrhosis. Admitted on 3/3 for elective extensive AAA repair (see below). PCCM asked to see post-op to assist w/ post op care.   SIGNIFICANT EVENTS:  3/03 Aorto to Lt/Rt external iliac bypass, lt internal iliac bypass, ligation Rt internal iliac aneurysm, re-implantation inferior mesenteric artery, thrombectomy Lt/Rt iliac and femoral arteries 3/04 Extubated >> respiratory distress, A fib RVR, hypotension, fever >> pressors, intubated, Abx, solucortef, amiodarone 3/05 Converted to sinus rhythm  STUDIES:   LINES / TUBES: ETT 3/03 >>> 3/3, 3/4>>>3/7 Right PAC 3/3 >>>3/04 Aline left rad 3/3 >>> Rt IJ CVL 3/04 >>>  CULTURES: Blood 3/04 >>>neg Sputum 3/04 >>>nml flora  CDiff 3/8>>> neg   ANTIBIOTICS: zinacef 3/3 >>> 3/04 Vancomycin 3/04 >>>3/8 Zosyn 3/04 >>>3/8  SUBJECTIVE:  Extubated 3/7.  Feels good.  Intermittent tachycardia.  Worsening diarrhea (CDiff neg).   VITAL SIGNS: Temp:  [97.6 F (36.4 C)-100 F (37.8 C)] 98.6 F (37 C) (03/08 0736) Pulse Rate:  [87-123] 107 (03/08 1000) Resp:  [11-22] 19 (03/08 1000) BP: (90-156)/(58-88) 137/88 mmHg (03/08 1000) SpO2:  [87 %-99 %] 92 % (03/08 1000) Arterial Line BP: (100-103)/(50-55) 100/50 mmHg (03/07 1200) FiO2 (%):  [55 %-100 %] 90 % (03/08 0828) Weight:  [244 lb 11.4 oz (111 kg)] 244 lb 11.4 oz (111 kg) (03/08 0500) HEMODYNAMICS: CVP:  [4 mmHg-9 mmHg] 5 mmHg VENTILATOR SETTINGS: Vent Mode:  [-]  FiO2 (%):  [55 %-100 %] 90 % INTAKE / OUTPUT: Intake/Output     03/07 0701 - 03/08 0700 03/08 0701 - 03/09 0700   I.V. (mL/kg) 1217 (11) 95.1  (0.9)   NG/GT 60    IV Piggyback 650 150   Total Intake(mL/kg) 1927 (17.4) 245.1 (2.2)   Urine (mL/kg/hr) 3507 (1.3) 1254 (3)   Emesis/NG output     Stool 8 (0)    Total Output 3515 1254   Net -1588 -1008.9          PHYSICAL EXAMINATION: General:  No distress Neuro :awake, alert, appropriate HEENT:  Mm dry, no JVD  Cardiovascular: regular, POS murmur, intermittent rapid afib Lungs: resps even non labored on Bliss, no wheeze, scattered rhonchi Abdomen:  Soft, midline incision with staples, clean Musculoskeletal: 1+ edema Skin: no rashes  LABS:  CBC  Recent Labs Lab 11/03/13 0341 11/04/13 0405 11/05/13 0403  WBC 7.0 5.5 7.5  HGB 8.8* 9.1* 10.0*  HCT 25.0* 25.8* 28.9*  PLT 80* 96* 111*   Coag's  Recent Labs Lab 10/31/13 2238 11/01/13 1010 11/01/13 1706  APTT 33 30 33  INR 1.32 1.37 1.29   BMET  Recent Labs Lab 11/03/13 0341 11/04/13 0405 11/05/13 0403  NA 134* 139 142  K 3.5* 3.5* 2.6*  CL 97 102 102  CO2 23 26 28   BUN 19 17 19   CREATININE 0.67 0.76 0.93  GLUCOSE 143* 131* 108*   Electrolytes  Recent Labs Lab 11/01/13 1810  11/02/13 1655 11/03/13 0341 11/04/13 0405 11/05/13 0403  CALCIUM 7.5*  < > 7.5* 7.5* 8.0* 7.9*  MG 1.4*  --  1.7  --  2.1  --   < > =  values in this interval not displayed. ABG  Recent Labs Lab 11/02/13 0954 11/03/13 0356 11/04/13 0325  PHART 7.390 7.446 7.517*  PCO2ART 40.5 36.5 33.8*  PO2ART 60.0* 71.1* 65.7*   Liver Enzymes  Recent Labs Lab 10/30/13 1407 11/01/13 0345  AST 34 29  ALT 29 13  ALKPHOS 154* 44  BILITOT 1.0 1.9*  ALBUMIN 3.4* 2.6*   Cardiac Enzymes  Recent Labs Lab 11/01/13 1810 11/01/13 2105 11/02/13 0025 11/02/13 0905  TROPONINI <0.30 <0.30  --  <0.30  PROBNP  --   --  602.3*  --    Glucose  Recent Labs Lab 11/04/13 1140 11/04/13 1537 11/04/13 1927 11/04/13 2327 11/05/13 0256 11/05/13 0638  GLUCAP 88 77 97 82 105* 109*    Imaging Dg Chest Portable 1 View  11/05/2013    CLINICAL DATA:  Pulmonary edema.  EXAM: PORTABLE CHEST - 1 VIEW  COMPARISON:  Chest x-ray 11/04/2013.  FINDINGS: There is a right-sided internal jugular central venous catheter with tip terminating in the distal superior vena cava. Left subclavian single-lumen porta cath with tip terminating in the distal superior vena cava. Endotracheal tube has been removed. A nasogastric tube is seen extending into the stomach, however, the tip of the nasogastric tube extends below the lower margin of the image. Lung volumes are low. Bibasilar opacities (left greater than right), favored to reflect predominantly subsegmental atelectasis. Small left pleural effusion. No evidence of pulmonary edema. Heart size is normal. Mediastinal contours are unremarkable. Atherosclerosis in the thoracic aorta. Emphysematous changes are again noted throughout the apices of the lungs bilaterally.  IMPRESSION: 1. Support apparatus, as above. 2. Low lung volumes with small left pleural effusion and probable subsegmental atelectasis in the lung bases bilaterally (left greater than right). 3. Atherosclerosis. 4. Emphysema.   Electronically Signed   By: Vinnie Langton M.D.   On: 11/05/2013 09:21   Dg Chest Port 1 View  11/04/2013   CLINICAL DATA:  Pleural effusions.  EXAM: PORTABLE CHEST - 1 VIEW  COMPARISON:  11/03/2013  FINDINGS: the ET tube tip is above the carina. There is a right IJ catheter with tip in the projection of the SVC. The NG tube tip is in the stomach. A left chest wall port a catheter is noted with tip in the projection of the SVC. Normal heart size. No change in bilateral pleural effusions and low lung volumes.  IMPRESSION: 1. Stable exam. No change in the pleural effusions and low lung volumes.   Electronically Signed   By: Kerby Moors M.D.   On: 11/04/2013 08:15   ASSESSMENT / PLAN:  PULMONARY A: Post-op ventilator dependence. Acute respiratory failure 3/04 2nd to shock, atelectasis, A fib. H/o COPD/emphysema, lung  ca. P:   Supplemental O2 as needed  F/u CXR Continue BD - duoneb  Aggressive pulmonary hygiene  Continue gentle diuresis  Mobilize   CARDIOVASCULAR A:  AAA repair  Post op Hypovolemic/ hemorrhagic shock. A fib with RVR 3/04 >> sinus rhythm 3/05. Hx of hyperlipidemia, CAD. P:  Continue lasix BID  D/c solucortef 3/8  RENAL A:   Hypokalemia - worse in setting diarrhea and aggressive diuresis  P:   Aggressively replace K with Afib RVR Monitor renal fx, urine outpt, electrolytes  GASTROINTESTINAL A:   Hx of Cirrhosis. Nutrition.  Diarrhea P:   NPO  Protonix for SUP  HEMATOLOGIC A:   Acute blood loss anemia (post-op) >> EBL 8.5 liters. Mild coagulopathy (in setting of underlying liver disease and multiple blood  products). Thrombocytopenia (consumptive, dilution). P: F/u CBC SCD's  INFECTIOUS A:   Fever 3/04.  P:   Day 5 vancomycin, zosyn  - cultures neg, afebrile - d/c abx   ENDOCRINE A:   Hx of hypothyroidism. Mild hyperglycemia . P:   Levothyroxine 87.5 mcg IV (1/2 home dose) SSI  NEUROLOGIC A:   Post-op pain control. P:   PRN pain rx     WHITEHEART,KATHRYN, NP 11/05/2013  10:49 AM Pager: [phone] or [phone]  *Care during the described time interval was provided by me and/or other providers on the critical care team. I have reviewed this patient's available data, including medical history, events of note, physical examination and test results as part of my evaluation.   PCCM ATTENDING: I have interviewed and examined the patient and reviewed the database. I have formulated the assessment and plan as reflected in the note above with amendments made by me.   Has tolerated extubation well. Severe diarrhea, C diff negative. Likely abx associated. No infectious process identified. Will DC abx. Correct electrolytes  Merton Border, MD;  PCCM service; Mobile [phone]

## 2013-11-05 NOTE — Progress Notes (Signed)
Pt going in and out of a-fib RVR rate in the 150s but unsustained also with frequent runs of 6-10 unifocal PVCs. Both of these were confirmed with a 12-lead EKG timed for 0252am (daylight savings time not yet taken effect). Elink MD Deterding made aware. No new orders received at this time. Pt is in rate-controlled a-fib with HR in 110-120s. BP stable. On Amioderone gtt.  Will continue to monitor pt closely.

## 2013-11-06 DIAGNOSIS — E876 Hypokalemia: Secondary | ICD-10-CM

## 2013-11-06 DIAGNOSIS — J810 Acute pulmonary edema: Secondary | ICD-10-CM

## 2013-11-06 LAB — BASIC METABOLIC PANEL
BUN: 18 mg/dL (ref 6–23)
BUN: 19 mg/dL (ref 6–23)
CHLORIDE: 105 meq/L (ref 96–112)
CO2: 28 mEq/L (ref 19–32)
CO2: 29 mEq/L (ref 19–32)
CREATININE: 0.83 mg/dL (ref 0.50–1.35)
CREATININE: 0.84 mg/dL (ref 0.50–1.35)
Calcium: 7.9 mg/dL — ABNORMAL LOW (ref 8.4–10.5)
Calcium: 7.9 mg/dL — ABNORMAL LOW (ref 8.4–10.5)
Chloride: 106 mEq/L (ref 96–112)
GFR calc non Af Amer: 87 mL/min — ABNORMAL LOW (ref >90)
GFR, EST NON AFRICAN AMERICAN: 87 mL/min — AB (ref >90)
Glucose, Bld: 103 mg/dL — ABNORMAL HIGH (ref 70–99)
Glucose, Bld: 99 mg/dL (ref 70–99)
POTASSIUM: 3.2 meq/L — AB (ref 3.7–5.3)
Potassium: 3 mEq/L — ABNORMAL LOW (ref 3.7–5.3)
Sodium: 143 mEq/L (ref 137–147)
Sodium: 143 mEq/L (ref 137–147)

## 2013-11-06 LAB — GLUCOSE, CAPILLARY
Glucose-Capillary: 100 mg/dL — ABNORMAL HIGH (ref 70–99)
Glucose-Capillary: 104 mg/dL — ABNORMAL HIGH (ref 70–99)
Glucose-Capillary: 105 mg/dL — ABNORMAL HIGH (ref 70–99)
Glucose-Capillary: 89 mg/dL (ref 70–99)
Glucose-Capillary: 95 mg/dL (ref 70–99)
Glucose-Capillary: 99 mg/dL (ref 70–99)

## 2013-11-06 LAB — PHOSPHORUS: Phosphorus: 3.2 mg/dL (ref 2.3–4.6)

## 2013-11-06 LAB — CBC
HCT: 29.4 % — ABNORMAL LOW (ref 39.0–52.0)
Hemoglobin: 9.9 g/dL — ABNORMAL LOW (ref 13.0–17.0)
MCH: 32.1 pg (ref 26.0–34.0)
MCHC: 33.7 g/dL (ref 30.0–36.0)
MCV: 95.5 fL (ref 78.0–100.0)
Platelets: 108 10*3/uL — ABNORMAL LOW (ref 150–400)
RBC: 3.08 MIL/uL — ABNORMAL LOW (ref 4.22–5.81)
RDW: 16.4 % — AB (ref 11.5–15.5)
WBC: 7.6 10*3/uL (ref 4.0–10.5)

## 2013-11-06 MED ORDER — POTASSIUM CHLORIDE 10 MEQ/50ML IV SOLN
10.0000 meq | INTRAVENOUS | Status: AC
  Administered 2013-11-06 (×4): 10 meq via INTRAVENOUS
  Filled 2013-11-06: qty 50

## 2013-11-06 MED ORDER — DIPHENHYDRAMINE HCL 12.5 MG/5ML PO ELIX
12.5000 mg | ORAL_SOLUTION | Freq: Four times a day (QID) | ORAL | Status: DC | PRN
  Filled 2013-11-06: qty 5

## 2013-11-06 MED ORDER — ONDANSETRON HCL 4 MG/2ML IJ SOLN
4.0000 mg | Freq: Four times a day (QID) | INTRAMUSCULAR | Status: DC | PRN
  Administered 2013-11-07: 4 mg via INTRAVENOUS

## 2013-11-06 MED ORDER — POTASSIUM CHLORIDE 10 MEQ/50ML IV SOLN
10.0000 meq | INTRAVENOUS | Status: AC
  Administered 2013-11-06 (×4): 10 meq via INTRAVENOUS
  Filled 2013-11-06 (×4): qty 50

## 2013-11-06 MED ORDER — FENTANYL 25 MCG/HR TD PT72: 25.0000 ug | MEDICATED_PATCH | TRANSDERMAL | Status: DC

## 2013-11-06 MED ORDER — DIPHENHYDRAMINE HCL 50 MG/ML IJ SOLN: 12.5000 mg | Freq: Four times a day (QID) | INTRAMUSCULAR | Status: DC | PRN

## 2013-11-06 MED ORDER — NALOXONE HCL 0.4 MG/ML IJ SOLN: 0.4000 mg | INTRAMUSCULAR | Status: DC | PRN

## 2013-11-06 MED ORDER — POTASSIUM CHLORIDE 20 MEQ/15ML (10%) PO LIQD
40.0000 meq | Freq: Two times a day (BID) | ORAL | Status: DC
  Filled 2013-11-06 (×2): qty 30

## 2013-11-06 MED ORDER — FENTANYL 10 MCG/ML IV SOLN
INTRAVENOUS | Status: DC
  Administered 2013-11-06: 10:00:00 via INTRAVENOUS
  Administered 2013-11-06: 45 ug via INTRAVENOUS
  Administered 2013-11-06: 60 ug via INTRAVENOUS
  Administered 2013-11-06: 45 ug via INTRAVENOUS
  Administered 2013-11-07: 90 ug via INTRAVENOUS
  Administered 2013-11-07: 75 ug via INTRAVENOUS
  Administered 2013-11-07: 11:00:00 via INTRAVENOUS
  Administered 2013-11-07: 45 ug via INTRAVENOUS
  Administered 2013-11-08: 15 ug via INTRAVENOUS
  Filled 2013-11-06 (×2): qty 50

## 2013-11-06 MED ORDER — SODIUM CHLORIDE 0.9 % IJ SOLN
9.0000 mL | INTRAMUSCULAR | Status: DC | PRN
Start: 2013-11-06 — End: 2013-11-08

## 2013-11-06 NOTE — Plan of Care (Signed)
Problem: Phase I Progression Outcomes Goal: Pain controlled with appropriate interventions Outcome: Progressing Fentanyl pca started. Patient actually able to nap and has better pain control. Goal: Activity progression as ordered Outcome: Progressing Up to chair and able to take a few steps

## 2013-11-06 NOTE — Progress Notes (Signed)
Name: Joseph [name] MRN: [id number] DOB: 11-Jun-1941    ADMISSION DATE:  10/31/2013 CONSULTATION DATE:  10/31/2013  REFERRING MD :  Oneida Alar   CHIEF COMPLAINT:  Post-op vent/critical care   BRIEF PATIENT DESCRIPTION:  This is a 73 year old male w/ several medical co-morbids: COPD, H/o lung cancer (observation since May of 2013 with no evidence for disease progression), and cirrhosis. Admitted on 3/3 for elective extensive AAA repair (see below). PCCM asked to see post-op to assist w/ post op care.   SIGNIFICANT EVENTS:  3/03 Aorto to Lt/Rt external iliac bypass, lt internal iliac bypass, ligation Rt internal iliac aneurysm, re-implantation inferior mesenteric artery, thrombectomy Lt/Rt iliac and femoral arteries 3/04 Extubated >> respiratory distress, A fib RVR, hypotension, fever >> pressors, intubated, Abx, solucortef, amiodarone 3/05 Converted to sinus rhythm 3/07 extubate and well tolerated and diarrhea.  STUDIES: C. Diff 3/8 negative.  LINES / TUBES: ETT 3/03 >>> 3/3, 3/4>>>3/7 Right PAC 3/3 >>>3/04 Aline left rad 3/3 >>> Rt IJ CVL 3/04 >>>  CULTURES: Blood 3/04 >>>neg Sputum 3/04 >>>nml flora  CDiff 3/8>>> neg   ANTIBIOTICS: zinacef 3/3 >>> 3/04 Vancomycin 3/04 >>>3/8 Zosyn 3/04 >>>3/8  SUBJECTIVE:  Extubated 3/7. C/o back pain that is chronic.  VITAL SIGNS: Temp:  [98.3 F (36.8 C)-100.1 F (37.8 C)] 98.3 F (36.8 C) (03/09 0833) Pulse Rate:  [89-110] 101 (03/09 0835) Resp:  [12-23] 16 (03/09 0835) BP: (112-153)/(65-88) 132/77 mmHg (03/09 0700) SpO2:  [88 %-96 %] 95 % (03/09 0835) FiO2 (%):  [93 %-94 %] 94 % (03/08 1400) Weight:  [106.1 kg (233 lb 14.5 oz)] 106.1 kg (233 lb 14.5 oz) (03/09 0600) HEMODYNAMICS:   VENTILATOR SETTINGS: Vent Mode:  [-]  FiO2 (%):  [93 %-94 %] 94 % INTAKE / OUTPUT: Intake/Output     03/08 0701 - 03/09 0700 03/09 0701 - 03/10 0700   P.O. 100    I.V. (mL/kg) 1300.8 (12.3)    NG/GT 60    IV Piggyback 1060    Total  Intake(mL/kg) 2520.8 (23.8)    Urine (mL/kg/hr) 3898 (1.5)    Emesis/NG output 350 (0.1)    Stool 300 (0.1)    Total Output 4548     Net -2027.2            PHYSICAL EXAMINATION: General:  No distress Neuro :awake, alert, appropriate HEENT:  Mm dry, no JVD  Cardiovascular: regular, POS murmur, intermittent rapid afib Lungs: resps even non labored on [place], no wheeze, scattered rhonchi Abdomen:  Soft, midline incision with staples, clean Musculoskeletal: improving edema Skin: no rashes  LABS:  CBC  Recent Labs Lab 11/04/13 0405 11/05/13 0403 11/05/13 1147 11/06/13 0500  WBC 5.5 7.5  --  7.6  HGB 9.1* 10.0* 10.9* 9.9*  HCT 25.8* 28.9* 32.0* 29.4*  PLT 96* 111*  --  108*   Coag's  Recent Labs Lab 10/31/13 2238 11/01/13 1010 11/01/13 1706  APTT 33 30 33  INR 1.32 1.37 1.29   BMET  Recent Labs Lab 11/05/13 0403 11/05/13 1147 11/05/13 2000 11/06/13 0500  NA 142 144 143 143  K 2.6* 2.6* 3.0* 3.2*  CL 102 103 105 106  CO2 28  --  28 29  BUN 19 15 18 19   CREATININE 0.93 1.00 0.83 0.84  GLUCOSE 108* 99 103* 99   Electrolytes  Recent Labs Lab 11/02/13 1655  11/04/13 0405 11/05/13 0403 11/05/13 2000 11/06/13 0500  CALCIUM 7.5*  < > 8.0* 7.9* 7.9* 7.9*  MG 1.7  --  2.1 1.8  --   --   PHOS  --   --   --  2.0*  --  3.2  < > = values in this interval not displayed. ABG  Recent Labs Lab 11/02/13 0954 11/03/13 0356 11/04/13 0325  PHART 7.390 7.446 7.517*  PCO2ART 40.5 36.5 33.8*  PO2ART 60.0* 71.1* 65.7*   Liver Enzymes  Recent Labs Lab 10/30/13 1407 11/01/13 0345  AST 34 29  ALT 29 13  ALKPHOS 154* 44  BILITOT 1.0 1.9*  ALBUMIN 3.4* 2.6*   Cardiac Enzymes  Recent Labs Lab 11/01/13 1810 11/01/13 2105 11/02/13 0025 11/02/13 0905  TROPONINI <0.30 <0.30  --  <0.30  PROBNP  --   --  602.3*  --    Glucose  Recent Labs Lab 11/05/13 1153 11/05/13 1529 11/05/13 1942 11/06/13 0015 11/06/13 0454 11/06/13 0830  GLUCAP 99 98 108* 104*  95 105*    Imaging Dg Chest Portable 1 View  11/05/2013   CLINICAL DATA:  Pulmonary edema.  EXAM: PORTABLE CHEST - 1 VIEW  COMPARISON:  Chest x-ray 11/04/2013.  FINDINGS: There is a right-sided internal jugular central venous catheter with tip terminating in the distal superior vena cava. Left subclavian single-lumen porta cath with tip terminating in the distal superior vena cava. Endotracheal tube has been removed. A nasogastric tube is seen extending into the stomach, however, the tip of the nasogastric tube extends below the lower margin of the image. Lung volumes are low. Bibasilar opacities (left greater than right), favored to reflect predominantly subsegmental atelectasis. Small left pleural effusion. No evidence of pulmonary edema. Heart size is normal. Mediastinal contours are unremarkable. Atherosclerosis in the thoracic aorta. Emphysematous changes are again noted throughout the apices of the lungs bilaterally.  IMPRESSION: 1. Support apparatus, as above. 2. Low lung volumes with small left pleural effusion and probable subsegmental atelectasis in the lung bases bilaterally (left greater than right). 3. Atherosclerosis. 4. Emphysema.   Electronically Signed   By: Vinnie Langton M.D.   On: 11/05/2013 09:21   ASSESSMENT / PLAN:  PULMONARY A: Post-op ventilator dependence. Acute respiratory failure 3/04 2nd to shock, atelectasis, A fib. H/o COPD/emphysema, lung ca. P:   Supplemental O2 as needed  Continue BD - duoneb  Aggressive pulmonary hygiene  Continue gentle diuresis as approaching dry weight at this point. Mobilize   CARDIOVASCULAR A:  AAA repair  Post op Hypovolemic/ hemorrhagic shock. A fib with RVR 3/04 >> sinus rhythm 3/05. Hx of hyperlipidemia, CAD. P:  Continue lasix BID at 20 BID for now, once able to take PO will change to 40 once daily. D/c solucortef 3/8  RENAL A:   Hypokalemia - worse in setting diarrhea and aggressive diuresis  P:   Aggressively replace  K with Afib RVR Monitor renal fx, urine outpt, electrolytes  GASTROINTESTINAL A:   Hx of Cirrhosis. Nutrition.  Diarrhea P:   NPO for today and surgery plans for clear liquids tonight and advance in AM. Protonix for SUP  HEMATOLOGIC A:   Acute blood loss anemia (post-op) >> EBL 8.5 liters. Mild coagulopathy (in setting of underlying liver disease and multiple blood products). Thrombocytopenia (consumptive, dilution). P: F/u CBC SCD's  INFECTIOUS A:   Fever 3/04.  P:   Day 5 vancomycin, zosyn  - cultures neg, afebrile - d/c abx   ENDOCRINE A:   Hx of hypothyroidism. Mild hyperglycemia . P:   Levothyroxine 87.5 mcg IV (1/2 home dose) SSI  NEUROLOGIC  A:   Post-op pain control. P:   PRN pain rx   *Care during the described time interval was provided by me and/or other providers on the critical care team. I have reviewed this patient's available data, including medical history, events of note, physical examination and test results as part of my evaluation.  Rush Farmer, M.D. [facility] Pulmonary/Critical Care Medicine. Pager: [phone]. After hours pager: [phone].

## 2013-11-06 NOTE — Progress Notes (Signed)
[place] Progress Note Patient Name: Joseph [name] DOB: [birth year] MRN: [id number]  Date of Service  11/06/2013   HPI/Events of Note     eICU Interventions  Hypokalemia -repleted    Intervention Category Intermediate Interventions: Electrolyte abnormality - evaluation and management  ALVA,RAKESH V. 11/06/2013, 1:54 AM

## 2013-11-06 NOTE — Progress Notes (Addendum)
[facility]  Subjective  - His back is hurting, he states it is arthritis and he has had it for a long time.  He has had nonbloody diarrhea over the weekend.  No abdominal pain.     Objective 132/77 102 99.7 F (37.6 C) (Oral) 12 91%  Intake/Output Summary (Last 24 hours) at 11/06/13 0731 Last data filed at 11/06/13 0700  Gross per 24 hour  Intake 2435.8 ml  Output   4548 ml  Net -2112.2 ml    Abdomin soft Incision D/D/I Palpable DP pulse equal bil.   Assessment/Planning: POD #6 aorto to left external iliac and right external iliac and left internal iliac, reimplantation of inferior mesenteric artery and ligation of left iliac artery aneursym (N/A)  THROMBECTOMY ILIAC ARTERY (Bilateral) Diarrhea pending C-diff results, NG tube D/C ? Hypokalemia K replaced A Fib amioderone Pain will start fentanyl patch 25 mcg q 72, pain was not well controlled with PRN IV push   Theda Sers EMMA [facility] 11/06/2013 7:31 AM --  Abdomen soft, still with some diarrhea, no pain Has some back pain and stiffness chronically Hypokalemia replete Continue slow diuresis still up 13-15 liters D/c NG tube today but keep NPO Will consider clear liquids tomorrow if tolerates NG out Follow up c diff Will use PCA rather than Fentanyl patch for pain control Needs to start walking Consider transfer to 2W tomorrow if does well today. A fib on Amiodarone will recheck 12 lead EKG today appears sinus on monitor  Ruta Hinds, MD [facility] [place]: [phone] Pager: [phone]   Laboratory Lab Results:  Recent Labs  11/05/13 0403 11/05/13 1147 11/06/13 0500  WBC 7.5  --  7.6  HGB 10.0* 10.9* 9.9*  HCT 28.9* 32.0* 29.4*  PLT 111*  --  108*   BMET  Recent Labs  11/05/13 2000 11/06/13 0500  NA 143 143  K 3.0* 3.2*  CL 105 106  CO2 28 29  GLUCOSE 103* 99  BUN 18 19  CREATININE 0.83 0.84  CALCIUM 7.9* 7.9*     COAG Lab Results  Component Value Date   INR 1.29 11/01/2013   INR 1.37 11/01/2013   INR 1.32 10/31/2013   No results found for this basename: PTT

## 2013-11-06 NOTE — Progress Notes (Signed)
NUTRITION FOLLOW UP  INTERVENTION: Advance diet as medically appropriate, add supplements as needed RD to follow for nutrition care plan  NUTRITION DIAGNOSIS: Inadequate oral intake related to inability to eat as evidenced by NPO status, ongoing  Goal: Pt to meet >/= 90% of their estimated nutrition needs, currently unmet  Monitor:  PO diet advancement & intake, weight, labs, I/O's   ASSESSMENT: 73 y.o. year old male who presents for evaluation of abdominal aortic aneurysm. Patient returns today after recent CT scan of the abdomen and pelvis did discuss an intervention The patient denies abdominal pain. The patient denies back pain. The patient has family history of AAA in his brother. Other medical problems include stage 3 lung cancer dormant status post chemo radiation. 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 He denies significant cardiac history. He is also being considered for left knee replacement by Dr. Durward Fortes. Other chronic medical problems include hyperlipidemia, COPD, possible cirrhosis by CT scan.  Patient s/p procedure 3/3: AORTO TO LEFT AND RIGHT EXTERNAL ILIAC BYPASS LEFT INTERNAL ILIAC ARTERY BYPASS LIGATION RIGHT INTERNAL ILIAC ANEURYSM REIMPLANTATION OF INTERIOR MESENTERIC ARTERY THROMBECTOMY OF LEFT AND RIGHT ILIAC AND FEMORAL ARTERIES  Patient extubated 3/7.  No abdominal pain.  Surgery plans for clear liquids tonight and diet advancement in AM.  Height: Ht Readings from Last 1 Encounters:  10/31/13 6' (1.829 m)    Weight: Wt Readings from Last 1 Encounters:  11/06/13 233 lb 14.5 oz (106.1 kg)   Re-estimated needs: Kcal: 2100-2300 Protein: 125-135 gm Fluid: per MD  Skin: surgical abdominal incision   Diet Order: NPO   Intake/Output Summary (Last 24 hours) at 11/06/13 1151 Last data filed at 11/06/13 0700  Gross per 24 hour  Intake   2039 ml  Output   2994 ml  Net   -955 ml    Labs:   Recent Labs Lab  11/02/13 1655  11/04/13 0405 11/05/13 0403 11/05/13 1147 11/05/13 2000 11/06/13 0500  NA 133*  < > 139 142 144 143 143  K 3.6*  < > 3.5* 2.6* 2.6* 3.0* 3.2*  CL 97  < > 102 102 103 105 106  CO2 24  < > 26 28  --  28 29  BUN 14  < > 17 19 15 18 19   CREATININE 0.73  < > 0.76 0.93 1.00 0.83 0.84  CALCIUM 7.5*  < > 8.0* 7.9*  --  7.9* 7.9*  MG 1.7  --  2.1 1.8  --   --   --   PHOS  --   --   --  2.0*  --   --  3.2  GLUCOSE 125*  < > 131* 108* 99 103* 99  < > = values in this interval not displayed.  CBG (last 3)   Recent Labs  11/06/13 0015 11/06/13 0454 11/06/13 0830  GLUCAP 104* 95 105*    Scheduled Meds: . antiseptic oral rinse  15 mL Mouth Rinse QID  . chlorhexidine  15 mL Mouth Rinse BID  . docusate  100 mg Oral Daily  . fentaNYL   Intravenous 6 times per day  . furosemide  20 mg Intravenous BID  . insulin aspart  0-20 Units Subcutaneous 6 times per day  . ipratropium-albuterol  3 mL Nebulization Q6H  . levothyroxine  87.5 mcg Intravenous Daily  . pantoprazole (PROTONIX) IV  40 mg Intravenous QHS  . potassium chloride  10 mEq Intravenous Q1  Hr x 4    Continuous Infusions: . sodium chloride 20 mL/hr (10/31/13 1847)  . sodium chloride 20 mL/hr at 11/06/13 0200  . amiodarone (NEXTERONE PREMIX) 360 mg/200 mL dextrose 30 mg/hr (11/06/13 0700)  . dextrose 25 mL/hr at 11/06/13 0700  . dextrose 5 % and 0.45% NaCl Stopped (11/04/13 1300)    Past Medical History  Diagnosis Date  . History of colon polyps 2006,2008  . 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 Hyperlipidemia     takes Pravastatin  . Emphysema   . Lung cancer     right upper lobe  . Enlarged prostate     takes Rapaflo daily  . Arthritis     left knee and back arthrtis  . Hypothyroidism     takes Synthroid daily  . Cataract   . Hx of radiation therapy 09/02/11 to 10/19/11    chest  . Diverticulosis   . Cirrhosis     By radiography, seen on CT  . Rosacea   . CAD (coronary artery disease)   . Shortness  of breath   . Heart murmur     teen    Past Surgical History  Procedure Laterality Date  . Hernia repair  70's  . Vasectomy  70's  . Finger surgery  2008    left pointer finger  . Knee arthroscopy  2008    left knee  . Tonsillectomy      as a child  . Colonoscopy w/ biopsies      multiple   . Cardiac catheterization  2003  . Esophagogastroduodenoscopy  2006  . Portacath placement  08/10/2011    Procedure: INSERTION PORT-A-CATH;  Surgeon: Pierre Bali, MD;  Location: [place] [facility];  Service: Thoracic;  Laterality: Left;  Left Subclavian  . Abdominal aortic aneurysm repair N/A 10/31/2013    Procedure: aorto to left external iliac and right external iliac and left internal iliac, reimplantation of inferior mesenteric artery and ligation of left iliac artery aneursym;  Surgeon: Elam Dutch, MD;  Location: [place];  Service: Vascular;  Laterality: N/A;  . Thrombectomy iliac artery Bilateral 10/31/2013    Procedure: THROMBECTOMY ILIAC ARTERY;  Surgeon: Elam Dutch, MD;  Location: [place];  Service: Vascular;  Laterality: Bilateral;    Arthur Holms, RD, LDN Pager #: [phone] After-Hours Pager #: [phone]

## 2013-11-06 NOTE — Progress Notes (Signed)
BMET in computer for 11/05/13 timed for 2000 was collected 11/06/13 at 0000 after K-Phos administration was complete. Elink MD Elsworth Soho called and made aware of results at 0130. New orders received.

## 2013-11-07 ENCOUNTER — Encounter (HOSPITAL_COMMUNITY): Payer: Self-pay | Admitting: Anesthesiology

## 2013-11-07 LAB — GLUCOSE, CAPILLARY
GLUCOSE-CAPILLARY: 101 mg/dL — AB (ref 70–99)
GLUCOSE-CAPILLARY: 90 mg/dL (ref 70–99)
Glucose-Capillary: 91 mg/dL (ref 70–99)
Glucose-Capillary: 79 mg/dL (ref 70–99)
Glucose-Capillary: 88 mg/dL (ref 70–99)
Glucose-Capillary: 81 mg/dL (ref 70–99)

## 2013-11-07 LAB — BASIC METABOLIC PANEL
BUN: 20 mg/dL (ref 6–23)
CO2: 27 meq/L (ref 19–32)
CREATININE: 0.77 mg/dL (ref 0.50–1.35)
Calcium: 8.1 mg/dL — ABNORMAL LOW (ref 8.4–10.5)
Chloride: 104 mEq/L (ref 96–112)
GFR calc non Af Amer: 90 mL/min — ABNORMAL LOW (ref >90)
Glucose, Bld: 138 mg/dL — ABNORMAL HIGH (ref 70–99)
Potassium: 3.3 mEq/L — ABNORMAL LOW (ref 3.7–5.3)
Sodium: 142 mEq/L (ref 137–147)

## 2013-11-07 LAB — CBC
HCT: 29.8 % — ABNORMAL LOW (ref 39.0–52.0)
Hemoglobin: 10 g/dL — ABNORMAL LOW (ref 13.0–17.0)
MCH: 32.5 pg (ref 26.0–34.0)
MCHC: 33.6 g/dL (ref 30.0–36.0)
MCV: 96.8 fL (ref 78.0–100.0)
PLATELETS: 109 10*3/uL — AB (ref 150–400)
RBC: 3.08 MIL/uL — ABNORMAL LOW (ref 4.22–5.81)
RDW: 16.3 % — AB (ref 11.5–15.5)
WBC: 7.1 10*3/uL (ref 4.0–10.5)

## 2013-11-07 LAB — MAGNESIUM: MAGNESIUM: 1.7 mg/dL (ref 1.5–2.5)

## 2013-11-07 LAB — PHOSPHORUS: Phosphorus: 3.1 mg/dL (ref 2.3–4.6)

## 2013-11-07 MED ORDER — POTASSIUM CHLORIDE CRYS ER 20 MEQ PO TBCR
40.0000 meq | EXTENDED_RELEASE_TABLET | Freq: Three times a day (TID) | ORAL | Status: AC
  Administered 2013-11-07 (×2): 40 meq via ORAL
  Filled 2013-11-07 (×2): qty 2

## 2013-11-07 MED ORDER — POTASSIUM CHLORIDE 10 MEQ/100ML IV SOLN: 10.0000 meq | INTRAVENOUS | Status: DC

## 2013-11-07 MED ORDER — LEVOTHYROXINE SODIUM 175 MCG PO TABS
175.0000 ug | ORAL_TABLET | Freq: Every day | ORAL | Status: DC
  Administered 2013-11-07 – 2013-11-13 (×7): 175 ug via ORAL
  Filled 2013-11-07 (×8): qty 1

## 2013-11-07 NOTE — Progress Notes (Addendum)
History cleared from PCA Fentanyl Pump.  39.33mL left in syringe.   [place],RN verified.

## 2013-11-07 NOTE — Progress Notes (Signed)
Dr. Oneida Alar called to clarify IV orders; pt without peripheral IV; pt has central line with orders to d/c and place PICC; pt also has port-a-cath that is not accessed; per Dr. Oneida Alar do not access port-a-cath and attempt peripheral IV; if unable place peripheral then proceed with PICC line; IV team at bedside and made aware of new orders.

## 2013-11-07 NOTE — Addendum Note (Signed)
Addendum created 11/07/13 0900 by Albertha Ghee, MD   Modules edited: Anesthesia Attestations

## 2013-11-07 NOTE — Progress Notes (Signed)
Report called to [employer], 2W03. Pt transported safely on telemetry, bedside report given to [place], Therapist, sports. Vital signs WNL, GCS 15, Pt has no complaints of pain. Family at bedside.

## 2013-11-07 NOTE — Progress Notes (Signed)
Central line and sleeve d/c at this time; site benign; dressing applied; bedrest until 2045; will cont. To monitor.

## 2013-11-07 NOTE — Progress Notes (Signed)
[facility] [place]  Subjective  - Still with diarrhea but improved   Objective 108/68 93 98.1 F (36.7 C) (Oral) 20 91%  Intake/Output Summary (Last 24 hours) at 11/07/13 0731 Last data filed at 11/07/13 0700  Gross per 24 hour  Intake 1679.8 ml  Output   1935 ml  Net -255.2 ml   Abdomen: soft incision clean slightly distended Extremites: improved edema but persists Cardiac: sinus rhythm  Assessment/Planning: Continued slow improvement Start clears today D/c amiodarone Continue slow diuresis Replete hypokalemia Transfer 2W  Con Arganbright E 11/07/2013 7:31 AM --  Laboratory Lab Results:  Recent Labs  11/06/13 0500 11/07/13 0348  WBC 7.6 7.1  HGB 9.9* 10.0*  HCT 29.4* 29.8*  PLT 108* 109*   BMET  Recent Labs  11/06/13 0500 11/07/13 0348  NA 143 142  K 3.2* 3.3*  CL 106 104  CO2 29 27  GLUCOSE 99 138*  BUN 19 20  CREATININE 0.84 0.77  CALCIUM 7.9* 8.1*    COAG Lab Results  Component Value Date   INR 1.29 11/01/2013   INR 1.37 11/01/2013   INR 1.32 10/31/2013   No results found for this basename: PTT

## 2013-11-07 NOTE — Progress Notes (Signed)
Berenice Primas, RN verified with this RN 86mL of Dilaudid placed in sharps container upon replacing syringe with full dose.

## 2013-11-07 NOTE — Evaluation (Signed)
Physical Therapy Evaluation Patient Details Name: Joseph [name] MRN: [id number] DOB: 03-Dec-1940 Today's Date: 11/07/2013 Time: 4401-0272 PT Time Calculation (min): 29 min  PT Assessment / Plan / Recommendation History of Present Illness  pt adm for elective surgical management of AAA.  Pt s/p  Aorto to left and right external iliac bypass, left internal iliac artery bypass, ligation right internal iliac aneurysm, reimplantation of inferior mesenteric artery. Thrombectomy of left and right iliac and femoral arteries  Clinical Impression  Pt admitted with/for management of AAA with post op complications.  Pt currently limited functionally due to the problems listed below.  (see problems list.)  Pt will benefit from PT to maximize function and safety to be able to get home safely with available assist from wife.     PT Assessment  Patient needs continued PT services    Follow Up Recommendations  Home health PT;Supervision/Assistance - 24 hour    Does the patient have the potential to tolerate intense rehabilitation      Barriers to Discharge        Equipment Recommendations  None recommended by PT (TBA)    Recommendations for Other Services     Frequency Min 3X/week    Precautions / Restrictions Precautions Precautions: Fall   Pertinent Vitals/Pain sats on 4-6L [place] at 89-92%  Hr up to 110's      Mobility  Bed Mobility Overal bed mobility: Needs Assistance Bed Mobility: Supine to Sit Supine to sit: Min assist General bed mobility comments: truncal assist.  minimal assist of bridge to EOB Transfers Overall transfer level: Needs assistance Transfers: Sit to/from Stand Sit to Stand: Min assist General transfer comment: stability assist Ambulation/Gait Ambulation/Gait assistance: Min assist Ambulation Distance (Feet): 35 Feet Assistive device: Rolling walker (2 wheeled) Gait Pattern/deviations: Step-through pattern Gait velocity interpretation: Below normal speed  for age/gender General Gait Details: mildly unsteady gait due to R greater than L weakness    Exercises     PT Diagnosis: Difficulty walking;Generalized weakness;Other (comment) (decr activity tolerance)  PT Problem List: Decreased strength;Decreased activity tolerance;Decreased balance;Decreased mobility;Decreased knowledge of use of DME;Cardiopulmonary status limiting activity PT Treatment Interventions: DME instruction;Gait training;Stair training;Functional mobility training;Therapeutic activities;Patient/family education;Balance training     PT Goals(Current goals can be found in the care plan section) Acute Rehab PT Goals Patient Stated Goal: I want to get home PT Goal Formulation: With patient Time For Goal Achievement: 11/14/13 Potential to Achieve Goals: Good  Visit Information  Last PT Received On: 11/07/13 Assistance Needed: +1 History of Present Illness: pt adm for elective surgical management of AAA.  Pt s/p  Aorto to left and right external iliac bypass, left internal iliac artery bypass, ligation right internal iliac aneurysm, reimplantation of inferior mesenteric artery. 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 Communication Communication: No difficulties    Cognition  Cognition Arousal/Alertness: Awake/alert Behavior During Therapy: WFL for tasks assessed/performed Overall Cognitive Status: Within Functional Limits for tasks assessed     Extremity/Trunk Assessment Upper Extremity Assessment Upper Extremity Assessment: Overall WFL for tasks assessed Lower Extremity  Assessment Lower Extremity Assessment: Generalized weakness   Balance Balance Overall balance assessment: Needs assistance Sitting-balance support: No upper extremity supported Sitting balance-Leahy Scale: Good Standing balance support: Bilateral upper extremity supported Standing balance-Leahy Scale: Fair  End of Session PT - End of Session Equipment Utilized During Treatment: Oxygen;Gait belt Activity Tolerance: Patient tolerated treatment well;Patient limited by fatigue Patient left: in bed;with call bell/phone within reach;with family/visitor present Nurse Communication: Mobility status  GP     Joseph [name], Joseph [name] 11/07/2013, 8:18 PM 11/07/2013  Joseph [name], [place] [phone]  (pager)

## 2013-11-07 NOTE — Progress Notes (Signed)
Pulmonary/Critical Care Progress Note   Name: Joseph [name] MRN: [id number] DOB: [birth year]    ADMISSION DATE:  10/31/2013 CONSULTATION DATE:  10/31/2013  REFERRING MD :  Oneida Alar   CHIEF COMPLAINT:  Post-op vent/critical care   BRIEF PATIENT DESCRIPTION:  This is a 73 year old male w/ several medical co-morbids: COPD, H/o lung cancer (observation since May of 2013 with no evidence for disease progression), and cirrhosis. Admitted on 3/3 for elective extensive AAA repair (see below). PCCM asked to see post-op to assist w/ post op care.   SIGNIFICANT EVENTS:  3/03 Aorto to Lt/Rt external iliac bypass, lt internal iliac bypass, ligation Rt internal iliac aneurysm, re-implantation inferior mesenteric artery, thrombectomy Lt/Rt iliac and femoral arteries 3/04 Extubated >> respiratory distress, A fib RVR, hypotension, fever >> pressors, intubated, Abx, solucortef, amiodarone 3/05 Converted to sinus rhythm 3/07 extubate and well tolerated and diarrhea.  STUDIES: C. Diff 3/8 negative.  LINES / TUBES: ETT 3/03 >>> 3/3, 3/4>>>3/7 Right PAC 3/3 >>>3/04 Aline left rad 3/3 >>> Rt IJ CVL 3/04 >>>  CULTURES: Blood 3/04 >>>neg Sputum 3/04 >>>nml flora  CDiff 3/8>>> neg   ANTIBIOTICS: zinacef 3/3 >>> 3/04 Vancomycin 3/04 >>>3/8 Zosyn 3/04 >>>3/8  SUBJECTIVE:  Extubated 3/7. C/o back pain that is chronic.  VITAL SIGNS: Temp:  [98.1 F (36.7 C)-99.5 F (37.5 C)] 98.1 F (36.7 C) (03/10 0727) Pulse Rate:  [84-103] 93 (03/10 0700) Resp:  [12-26] 24 (03/10 0741) BP: (108-130)/(66-75) 108/68 mmHg (03/10 0700) SpO2:  [87 %-95 %] 92 % (03/10 0741) Weight:  [104.8 kg (231 lb 0.7 oz)] 104.8 kg (231 lb 0.7 oz) (03/10 0615) HEMODYNAMICS:   VENTILATOR SETTINGS:   INTAKE / OUTPUT: Intake/Output     03/09 0701 - 03/10 0700 03/10 0701 - 03/11 0700   P.O.     I.V. (mL/kg) 1529.8 (14.6) 78.4 (0.7)   NG/GT     IV Piggyback 150    Total Intake(mL/kg) 1679.8 (16) 78.4 (0.7)   Urine  (mL/kg/hr) 1735 (0.7) 60 (0.3)   Emesis/NG output     Stool 200 (0.1)    Total Output 1935 60   Net -255.2 +18.4          PHYSICAL EXAMINATION: General:  No distress Neuro :awake, alert, appropriate HEENT:  Mm dry, no JVD  Cardiovascular: regular, POS murmur, intermittent rapid afib Lungs: resps even non labored on [place], no wheeze, scattered rhonchi Abdomen:  Soft, midline incision with staples, clean Musculoskeletal: improving edema Skin: no rashes  LABS:  CBC  Recent Labs Lab 11/05/13 0403 11/05/13 1147 11/06/13 0500 11/07/13 0348  WBC 7.5  --  7.6 7.1  HGB 10.0* 10.9* 9.9* 10.0*  HCT 28.9* 32.0* 29.4* 29.8*  PLT 111*  --  108* 109*   Coag's  Recent Labs Lab 10/31/13 2238 11/01/13 1010 11/01/13 1706  APTT 33 30 33  INR 1.32 1.37 1.29   BMET  Recent Labs Lab 11/05/13 2000 11/06/13 0500 11/07/13 0348  NA 143 143 142  K 3.0* 3.2* 3.3*  CL 105 106 104  CO2 28 29 27   BUN 18 19 20   CREATININE 0.83 0.84 0.77  GLUCOSE 103* 99 138*   Electrolytes  Recent Labs Lab 11/04/13 0405 11/05/13 0403 11/05/13 2000 11/06/13 0500 11/07/13 0348  CALCIUM 8.0* 7.9* 7.9* 7.9* 8.1*  MG 2.1 1.8  --   --  1.7  PHOS  --  2.0*  --  3.2 3.1   ABG  Recent Labs Lab 11/02/13 0954 11/03/13 0356  11/04/13 0325  PHART 7.390 7.446 7.517*  PCO2ART 40.5 36.5 33.8*  PO2ART 60.0* 71.1* 65.7*   Liver Enzymes  Recent Labs Lab 11/01/13 0345  AST 29  ALT 13  ALKPHOS 44  BILITOT 1.9*  ALBUMIN 2.6*   Cardiac Enzymes  Recent Labs Lab 11/01/13 1810 11/01/13 2105 11/02/13 0025 11/02/13 0905  TROPONINI <0.30 <0.30  --  <0.30  PROBNP  --   --  602.3*  --    Glucose  Recent Labs Lab 11/06/13 1154 11/06/13 1546 11/06/13 1930 11/06/13 2337 11/07/13 0322 11/07/13 0725  GLUCAP 89 99 100* 90 91 101*    Imaging No results found. ASSESSMENT / PLAN:  PULMONARY A: Post-op ventilator dependence. Acute respiratory failure 3/04 2nd to shock, atelectasis, A  fib. H/o COPD/emphysema, lung ca. P:   Supplemental O2 as needed  Continue BD - duoneb  Aggressive pulmonary hygiene  Recommend starting Lasix 40 mg PO daily in AM assuming electrolytes normalize. Mobilize   CARDIOVASCULAR A:  AAA repair  Post op Hypovolemic/ hemorrhagic shock. A fib with RVR 3/04 >> sinus rhythm 3/05. Hx of hyperlipidemia, CAD. P:  D/C amiodarone. Lasix recommendations as above. D/c solucortef 3/8  RENAL A:   Hypokalemia - worse in setting diarrhea and aggressive diuresis  P:   PO K replacement. Would start Lasix 40 mg PO daily as above Monitor renal fx, urine outpt, electrolytes  GASTROINTESTINAL A:   Hx of Cirrhosis. Nutrition.  Diarrhea P:   Diet per surgery. Protonix for SUP  HEMATOLOGIC A:   Acute blood loss anemia (post-op) >> EBL 8.5 liters. Mild coagulopathy (in setting of underlying liver disease and multiple blood products). Thrombocytopenia (consumptive, dilution). P: F/u CBC SCD's  INFECTIOUS A:   Fever 3/04.  P:   Day 5 vancomycin, zosyn  - cultures neg, afebrile - d/c abx   ENDOCRINE A:   Hx of hypothyroidism. Mild hyperglycemia . P:   Levothyroxine 87.5 mcg IV (1/2 home dose) changed to PO at 175 PO daily, will need TSH levels checked as outpatient upon discharge. SSI  NEUROLOGIC A:   Post-op pain control. P:   PRN pain rx, remains on PCA, will defer to surgery  Transfer to tele, PCCM will sign off, please call back if needed.  *Care during the described time interval was provided by me and/or other providers on the critical care team. I have reviewed this patient's available data, including medical history, events of note, physical examination and test results as part of my evaluation.  Rush Farmer, M.D. [facility] Pulmonary/Critical Care Medicine. Pager: [phone]. After hours pager: [phone].

## 2013-11-08 LAB — URINALYSIS, ROUTINE W REFLEX MICROSCOPIC
Bilirubin Urine: NEGATIVE
GLUCOSE, UA: NEGATIVE mg/dL
Ketones, ur: NEGATIVE mg/dL
Leukocytes, UA: NEGATIVE
Nitrite: NEGATIVE
PROTEIN: NEGATIVE mg/dL
Specific Gravity, Urine: 1.013 (ref 1.005–1.030)
Urobilinogen, UA: 1 mg/dL (ref 0.0–1.0)
pH: 5 (ref 5.0–8.0)

## 2013-11-08 LAB — CULTURE, BLOOD (ROUTINE X 2)
CULTURE: NO GROWTH
Culture: NO GROWTH

## 2013-11-08 LAB — GLUCOSE, CAPILLARY
GLUCOSE-CAPILLARY: 84 mg/dL (ref 70–99)
GLUCOSE-CAPILLARY: 99 mg/dL (ref 70–99)
Glucose-Capillary: 83 mg/dL (ref 70–99)
Glucose-Capillary: 100 mg/dL — ABNORMAL HIGH (ref 70–99)
Glucose-Capillary: 97 mg/dL (ref 70–99)
Glucose-Capillary: 120 mg/dL — ABNORMAL HIGH (ref 70–99)

## 2013-11-08 LAB — BASIC METABOLIC PANEL
BUN: 18 mg/dL (ref 6–23)
CO2: 25 meq/L (ref 19–32)
CREATININE: 0.8 mg/dL (ref 0.50–1.35)
Calcium: 7.9 mg/dL — ABNORMAL LOW (ref 8.4–10.5)
Chloride: 104 mEq/L (ref 96–112)
GFR calc Af Amer: >90 mL/min (ref >90)
GFR calc non Af Amer: 88 mL/min — ABNORMAL LOW (ref >90)
GLUCOSE: 89 mg/dL (ref 70–99)
Potassium: 3.8 mEq/L (ref 3.7–5.3)
Sodium: 138 mEq/L (ref 137–147)

## 2013-11-08 LAB — CBC
HCT: 28.5 % — ABNORMAL LOW (ref 39.0–52.0)
Hemoglobin: 9.6 g/dL — ABNORMAL LOW (ref 13.0–17.0)
MCH: 33 pg (ref 26.0–34.0)
MCHC: 33.7 g/dL (ref 30.0–36.0)
MCV: 97.9 fL (ref 78.0–100.0)
Platelets: 99 10*3/uL — ABNORMAL LOW (ref 150–400)
RBC: 2.91 MIL/uL — ABNORMAL LOW (ref 4.22–5.81)
RDW: 16.2 % — AB (ref 11.5–15.5)
WBC: 5.5 10*3/uL (ref 4.0–10.5)

## 2013-11-08 LAB — URINE MICROSCOPIC-ADD ON

## 2013-11-08 LAB — PHOSPHORUS: Phosphorus: 2.5 mg/dL (ref 2.3–4.6)

## 2013-11-08 LAB — OCCULT BLOOD X 1 CARD TO LAB, STOOL: Fecal Occult Bld: NEGATIVE

## 2013-11-08 LAB — MAGNESIUM: Magnesium: 1.6 mg/dL (ref 1.5–2.5)

## 2013-11-08 MED ORDER — ALBUTEROL SULFATE (2.5 MG/3ML) 0.083% IN NEBU
2.5000 mg | INHALATION_SOLUTION | RESPIRATORY_TRACT | Status: DC | PRN
  Administered 2013-11-12: 2.5 mg via RESPIRATORY_TRACT
  Filled 2013-11-08: qty 3

## 2013-11-08 MED ORDER — FUROSEMIDE 10 MG/ML IJ SOLN
20.0000 mg | Freq: Once | INTRAMUSCULAR | Status: AC
  Administered 2013-11-08: 20 mg via INTRAVENOUS
  Filled 2013-11-08: qty 2

## 2013-11-08 MED ORDER — POTASSIUM CHLORIDE CRYS ER 20 MEQ PO TBCR
20.0000 meq | EXTENDED_RELEASE_TABLET | Freq: Two times a day (BID) | ORAL | Status: AC
  Administered 2013-11-08 (×2): 20 meq via ORAL
  Filled 2013-11-08 (×2): qty 1

## 2013-11-08 MED ORDER — IPRATROPIUM-ALBUTEROL 0.5-2.5 (3) MG/3ML IN SOLN
3.0000 mL | Freq: Three times a day (TID) | RESPIRATORY_TRACT | Status: DC
  Administered 2013-11-08: 3 mL via RESPIRATORY_TRACT
  Filled 2013-11-08: qty 3

## 2013-11-08 MED ORDER — IPRATROPIUM-ALBUTEROL 0.5-2.5 (3) MG/3ML IN SOLN
3.0000 mL | Freq: Two times a day (BID) | RESPIRATORY_TRACT | Status: DC
  Administered 2013-11-08 – 2013-11-13 (×10): 3 mL via RESPIRATORY_TRACT
  Filled 2013-11-08 (×10): qty 3

## 2013-11-08 MED ORDER — PANTOPRAZOLE SODIUM 40 MG PO TBEC
40.0000 mg | DELAYED_RELEASE_TABLET | Freq: Every day | ORAL | Status: DC
  Administered 2013-11-08 – 2013-11-12 (×5): 40 mg via ORAL
  Filled 2013-11-08 (×5): qty 1

## 2013-11-08 MED ORDER — OXYCODONE HCL 5 MG PO TABS
5.0000 mg | ORAL_TABLET | ORAL | Status: DC | PRN
  Administered 2013-11-08 – 2013-11-13 (×10): 5 mg via ORAL
  Filled 2013-11-08 (×11): qty 1

## 2013-11-08 NOTE — Progress Notes (Signed)
PCA D/C'd. 23mg  Fentanyl wasted in sharps and witnessed by Berenice Primas, RN. Will continue to monitor pt closely.

## 2013-11-08 NOTE — Progress Notes (Signed)
Physical Therapy Treatment Patient Details Name: Joseph [name] MRN: [id number] DOB: [birth year] Today's Date: 11/08/2013 Time: 1191-4782 PT Time Calculation (min): 32 min  PT Assessment / Plan / Recommendation  History of Present Illness pt adm for elective surgical management of AAA.  Pt s/p  Aorto to left and right external iliac bypass, left internal iliac artery bypass, ligation right internal iliac aneurysm, reimplantation of inferior mesenteric artery. Thrombectomy of left and right iliac and femoral arteries   PT Comments   On track for going home soon with wife's assist and HHPT.  Still fatigues quickly and is not very self motivated.  Follow Up Recommendations  Home health PT;Supervision/Assistance - 24 hour     Does the patient have the potential to tolerate intense rehabilitation     Barriers to Discharge        Equipment Recommendations  None recommended by PT    Recommendations for Other Services    Frequency Min 3X/week   Progress towards PT Goals Progress towards PT goals: Progressing toward goals  Plan Current plan remains appropriate    Precautions / Restrictions Precautions Precautions: Fall   Pertinent Vitals/Pain sats on 4 L [place] ( immediately after sitting) were 92%  EHR 90's    Mobility  Bed Mobility Overal bed mobility: Needs Assistance Bed Mobility: Sit to Supine Sit to supine: Min assist (for legs) Transfers Overall transfer level: Needs assistance Equipment used: Rolling walker (2 wheeled) Transfers: Sit to/from Stand Sit to Stand: Min assist General transfer comment: lifting and assist to come forward,  cues for hand placement Ambulation/Gait Ambulation/Gait assistance: Min assist (close to min guard) Ambulation Distance (Feet): 80 Feet (then later another 75 feet with RW) Assistive device: Rolling walker (2 wheeled) Gait Pattern/deviations: Step-through pattern;Trunk flexed Gait velocity interpretation: Below normal speed for  age/gender General Gait Details: more steady, LBP remains and results in flexed posture.  Still mildly unsteady esp during turns    Exercises General Exercises - Lower Extremity Hip Flexion/Marching: AROM;Strengthening;Both;10 reps;Standing   PT Diagnosis:    PT Problem List:   PT Treatment Interventions:     PT Goals (current goals can now be found in the care plan section) Acute Rehab PT Goals Patient Stated Goal: I want to get home PT Goal Formulation: With patient Time For Goal Achievement: 11/14/13 Potential to Achieve Goals: Good  Visit Information  Last PT Received On: 11/08/13 Assistance Needed: +1 History of Present Illness: pt adm for elective surgical management of AAA.  Pt s/p  Aorto to left and right external iliac bypass, left internal iliac artery bypass, ligation right internal iliac aneurysm, reimplantation of inferior mesenteric artery. Thrombectomy of left and right iliac and femoral arteries    Subjective Data  Subjective: Let's get on with this S@@@ Patient Stated Goal: I want to get home   Cognition  Cognition Arousal/Alertness: Lethargic;Suspect due to medications Behavior During Therapy: WFL for tasks assessed/performed Overall Cognitive Status: Within Functional Limits for tasks assessed    Balance  Balance Overall balance assessment: Needs assistance Sitting-balance support: No upper extremity supported;Feet supported Sitting balance-Leahy Scale: Good Standing balance support: Bilateral upper extremity supported Standing balance-Leahy Scale: Fair  End of Session PT - End of Session Equipment Utilized During Treatment: Oxygen;Gait belt Activity Tolerance: Patient tolerated treatment well;Patient limited by fatigue Patient left: in chair;with call bell/phone within reach;with family/visitor present Nurse Communication: Mobility status   GP     Matin Mattioli, Tessie Fass 11/08/2013, 12:44 PM 11/08/2013  Donnella Sham, PT [phone]  [phone]   (pager)

## 2013-11-08 NOTE — Evaluation (Signed)
Occupational Therapy Evaluation Patient Details Name: Joseph [name] MRN: [id number] DOB: [birth year] Today's Date: 11/08/2013 Time: 9811-9147 OT Time Calculation (min): 39 min  OT Assessment / Plan / Recommendation History of present illness pt adm for elective surgical management of AAA.  Pt s/p  Aorto to left and right external iliac bypass, left internal iliac artery bypass, ligation right internal iliac aneurysm, reimplantation of inferior mesenteric artery. Thrombectomy of left and right iliac and femoral arteries.  Hospital course complicated by VDRF.   Clinical Impression   Pt presents with lethargy, likely related to PCA.  Pt has chronic back pain, exacerbated by prolonged immobility.  He moves remarkably well, but fatigues easily.  His balance is decreased in standing interfering with ADL.  Will follow acutely to address ADL and ADL transfers.  Expect pt will be able to d/c home with his supportive wife.    OT Assessment  Patient needs continued OT Services    Follow Up Recommendations  Home health OT    Barriers to Discharge      Equipment Recommendations  3 in 1 bedside comode    Recommendations for Other Services    Frequency  Min 2X/week    Precautions / Restrictions Precautions Precautions: Fall   Pertinent Vitals/Pain VSS, back pain, did not rate, returned to supine, PCA    ADL  Eating/Feeding: Independent Where Assessed - Eating/Feeding: Edge of bed Grooming: Wash/dry hands;Wash/dry face;Brushing hair;Set up Where Assessed - Grooming: Unsupported sitting Upper Body Bathing: Minimal assistance (assist for back) Where Assessed - Upper Body Bathing: Unsupported sitting Lower Body Bathing: Minimal assistance Where Assessed - Lower Body Bathing: Unsupported sitting;Supported sit to stand Upper Body Dressing: Set up Where Assessed - Upper Body Dressing: Unsupported sitting Lower Body Dressing: Minimal assistance Where Assessed - Lower Body Dressing:  Unsupported sitting;Supported sit to stand Toilet Transfer: Minimal assistance Toilet Transfer Method: Sit to stand Equipment Used: Gait belt Transfers/Ambulation Related to ADLs: stood and took steps to [facility], declined sitting in chair ADL Comments: Pt is able to cross his foot over opposite knee to donn and doff socks. Pt has foley and rectocele.     OT Diagnosis: Generalized weakness;Acute pain  OT Problem List: Decreased strength;Decreased activity tolerance;Impaired balance (sitting and/or standing);Decreased knowledge of use of DME or AE;Cardiopulmonary status limiting activity;Obesity;Pain OT Treatment Interventions: Self-care/ADL training;Energy conservation;DME and/or AE instruction;Balance training;Patient/family education   OT Goals(Current goals can be found in the care plan section) Acute Rehab OT Goals Patient Stated Goal: I want to get home OT Goal Formulation: With patient Time For Goal Achievement: 11/22/13 Potential to Achieve Goals: Good ADL Goals Pt Will Perform Grooming: with supervision;standing Pt Will Perform Lower Body Bathing: with supervision;sit to/from stand Pt Will Perform Lower Body Dressing: with supervision;sit to/from stand Pt Will Transfer to Toilet: with supervision;ambulating;bedside commode (over toilet) Pt Will Perform Toileting - Clothing Manipulation and hygiene: with supervision;sit to/from stand Pt Will Perform Tub/Shower Transfer: Shower transfer;with supervision;ambulating;shower seat Additional ADL Goal #1: Pt will generalize energy conservation strategies in ADL with minimal verbal cues.  Visit Information  Last OT Received On: 11/08/13 Assistance Needed: +1 History of Present Illness: pt adm for elective surgical management of AAA.  Pt s/p  Aorto to left and right external iliac bypass, left internal iliac artery bypass, ligation right internal iliac aneurysm, reimplantation of inferior mesenteric artery. 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 Communication Communication: No difficulties Dominant Hand: Right         Vision/Perception Vision - History Patient Visual Report: No change from baseline   Cognition  Cognition Arousal/Alertness: Lethargic;Suspect due to medications Behavior During Therapy: WFL for tasks assessed/performed Overall Cognitive Status: Within Functional Limits for tasks assessed    Extremity/Trunk Assessment Upper Extremity Assessment Upper Extremity Assessment: Overall WFL for tasks assessed Lower Extremity Assessment Lower Extremity Assessment: Generalized weakness Cervical / Trunk Assessment Cervical / Trunk Assessment: Other exceptions (chronic back pain)     Mobility Bed Mobility Overal bed mobility: Needs Assistance Bed Mobility: Sit to Supine Sit to supine: Min assist (for legs) Transfers Overall transfer level: Needs assistance Transfers: Sit to/from Stand Sit to Stand: Min assist General transfer comment: assist to rise and for balance     Exercise     Balance Balance Sitting balance-Leahy Scale: Good Standing balance-Leahy Scale: Fair   End of Session OT - End of Session Activity Tolerance: Patient limited by fatigue Patient left: in bed;with call bell/phone within reach;with family/visitor present  GO     Malka So 11/08/2013, 9:58 AM [phone]

## 2013-11-08 NOTE — Progress Notes (Signed)
Pt c/o pain during urination. PA notified. Will obtain UA. Will continue to monitor pt closely.

## 2013-11-08 NOTE — Progress Notes (Addendum)
[facility] AAA Progress Note  11/08/2013 10:07 AM 8 Days Post-Op  Subjective:  Feeling better today.  Wants PCA off and take pain medication by mouth.  Inquiring about taking out foley also.    Filed Vitals:   11/08/13 0751  BP:   Pulse:   Temp:   Resp: 14    Physical Exam: Cardiac:  regular Lungs:  CTAB Abdomen:  Soft, NT/ND + BS; less diarrhea today Incisions:  C/d/i Extremities:  2+ palpable DP bilaterally;  Bilateral feet are warm  CBC    Component Value Date/Time   WBC 5.5 11/08/2013 0540   WBC 5.1 07/17/2013 0856   RBC 2.91* 11/08/2013 0540   RBC 4.35 07/17/2013 0856   HGB 9.6* 11/08/2013 0540   HGB 14.6 07/17/2013 0856   HCT 28.5* 11/08/2013 0540   HCT 43.2 07/17/2013 0856   PLT 99* 11/08/2013 0540   PLT 134* 07/17/2013 0856   MCV 97.9 11/08/2013 0540   MCV 99.2* 07/17/2013 0856   MCH 33.0 11/08/2013 0540   MCH 33.6* 07/17/2013 0856   MCHC 33.7 11/08/2013 0540   MCHC 33.8 07/17/2013 0856   RDW 16.2* 11/08/2013 0540   RDW 14.0 07/17/2013 0856   LYMPHSABS 1.2 07/17/2013 0856   MONOABS 0.5 07/17/2013 0856   EOSABS 0.1 07/17/2013 0856   BASOSABS 0.0 07/17/2013 0856    BMET    Component Value Date/Time   NA 138 11/08/2013 0540   NA 141 07/17/2013 0856   K 3.8 11/08/2013 0540   K 4.2 07/17/2013 0856   CL 104 11/08/2013 0540   CL 109* 08/01/2012 1046   CO2 25 11/08/2013 0540   CO2 23 07/17/2013 0856   GLUCOSE 89 11/08/2013 0540   GLUCOSE 94 07/17/2013 0856   GLUCOSE 93 08/01/2012 1046   BUN 18 11/08/2013 0540   BUN 14.7 07/17/2013 0856   CREATININE 0.80 11/08/2013 0540   CREATININE 1.00 08/17/2013 1250   CREATININE 0.9 07/17/2013 0856   CALCIUM 7.9* 11/08/2013 0540   CALCIUM 8.9 07/17/2013 0856   GFRNONAA 88* 11/08/2013 0540   GFRAA >90 11/08/2013 0540    INR    Component Value Date/Time   INR 1.29 11/01/2013 1706     Intake/Output Summary (Last 24 hours) at 11/08/13 1007 Last data filed at 11/08/13 0418  Gross per 24 hour  Intake     480 ml  Output   1300 ml  Net   -820 ml     Assessment/Plan:  73 y.o. male is s/p  Aorto to left and right external iliac bypass, left internal iliac artery bypass, ligation right internal iliac aneurysm, reimplantation of inferior mesenteric artery. Thrombectomy of left and right iliac and femoral arteries 8 Days Post-Op  -pt doing well this am -tolerating clear liquids -d/c PCA and start po pain medication -diarrhea better today-C diff from 11/05/13 was negative -d/c foley -continue to mobilize -hypokalemia better today -gentle diuresis today with K+ (33mEq bid x 2 doses) -ck labs in am   Leontine Locket, PA-C [facility] [phone] 11/08/2013 10:07 AM  I agree with the above.  His abdomen remained soft.  He continues to have diarrhea. Tolerating a clear liquid diet yesterday  DC Foley the CBC  Diarrhea: We'll check for blood.  C. difficile negative. Saline locked IV Encourage activity. Regular diet  Wells Brabham

## 2013-11-09 ENCOUNTER — Ambulatory Visit: Payer: Medicare Other | Admitting: Pharmacist

## 2013-11-09 LAB — CBC
HCT: 31.3 % — ABNORMAL LOW (ref 39.0–52.0)
HEMOGLOBIN: 10.7 g/dL — AB (ref 13.0–17.0)
MCH: 33.1 pg (ref 26.0–34.0)
MCHC: 34.2 g/dL (ref 30.0–36.0)
MCV: 96.9 fL (ref 78.0–100.0)
Platelets: 106 10*3/uL — ABNORMAL LOW (ref 150–400)
RBC: 3.23 MIL/uL — ABNORMAL LOW (ref 4.22–5.81)
RDW: 15.9 % — ABNORMAL HIGH (ref 11.5–15.5)
WBC: 8.4 10*3/uL (ref 4.0–10.5)

## 2013-11-09 LAB — BASIC METABOLIC PANEL
BUN: 18 mg/dL (ref 6–23)
CO2: 24 mEq/L (ref 19–32)
Calcium: 8 mg/dL — ABNORMAL LOW (ref 8.4–10.5)
Chloride: 99 mEq/L (ref 96–112)
Creatinine, Ser: 0.8 mg/dL (ref 0.50–1.35)
GFR calc non Af Amer: 88 mL/min — ABNORMAL LOW (ref >90)
GLUCOSE: 101 mg/dL — AB (ref 70–99)
POTASSIUM: 3.7 meq/L (ref 3.7–5.3)
Sodium: 135 mEq/L — ABNORMAL LOW (ref 137–147)

## 2013-11-09 LAB — GLUCOSE, CAPILLARY
GLUCOSE-CAPILLARY: 128 mg/dL — AB (ref 70–99)
GLUCOSE-CAPILLARY: 113 mg/dL — AB (ref 70–99)
Glucose-Capillary: 97 mg/dL (ref 70–99)
Glucose-Capillary: 103 mg/dL — ABNORMAL HIGH (ref 70–99)
Glucose-Capillary: 105 mg/dL — ABNORMAL HIGH (ref 70–99)

## 2013-11-09 MED ORDER — TAMSULOSIN HCL 0.4 MG PO CAPS
0.4000 mg | ORAL_CAPSULE | Freq: Every day | ORAL | Status: DC
  Administered 2013-11-09 – 2013-11-13 (×5): 0.4 mg via ORAL
  Filled 2013-11-09 (×5): qty 1

## 2013-11-09 NOTE — Progress Notes (Signed)
Pt ambulated 14ft pushing rolling walker and O2. Pt with some complaints of shakiness. Will continue to monitor pt closely.

## 2013-11-09 NOTE — Progress Notes (Signed)
NUTRITION FOLLOW UP  INTERVENTION: No nutrition intervention at this time -- pt declined RD to continue to follow  NUTRITION DIAGNOSIS: Inadequate oral intake now related to limited appetite as evidenced by pt report, ongoing  Goal: Pt to meet >/= 90% of their estimated nutrition needs, progressing  Monitor:  PO intake, weight, labs, I/O's   ASSESSMENT: 73 y.o. year old male who presents for evaluation of abdominal aortic aneurysm. Patient returns today after recent CT scan of the abdomen and pelvis did discuss an intervention The patient denies abdominal pain. The patient denies back pain. The patient has family history of AAA in his brother. Other medical problems include stage 3 lung cancer dormant status post chemo radiation. 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 He denies significant cardiac history. He is also being considered for left knee replacement by Dr. Durward Fortes. Other chronic medical problems include hyperlipidemia, COPD, possible cirrhosis by CT scan.  Patient s/p procedure 3/3: AORTO TO LEFT AND RIGHT EXTERNAL ILIAC BYPASS LEFT INTERNAL ILIAC ARTERY BYPASS LIGATION RIGHT INTERNAL ILIAC ANEURYSM REIMPLANTATION OF INTERIOR MESENTERIC ARTERY THROMBECTOMY OF LEFT AND RIGHT ILIAC AND FEMORAL ARTERIES  Patient transferred to 2W-Cardiac from 2S-Surgical ICU 3/10.  Diet advanced to Regular 3/11.  Appetite a little better yesterday.  PO intake variable at 25-100% per flowsheet records.  Declined addition of oral nutrition supplements at this time.  Height: Ht Readings from Last 1 Encounters:  10/31/13 6' (1.829 m)    Weight: Wt Readings from Last 1 Encounters:  11/07/13 231 lb 0.7 oz (104.8 kg)   Re-estimated needs: Kcal: 2100-2300 Protein: 110-120 gm Fluid: 2.1-2.3 L  Skin: surgical abdominal incision   Diet Order: General   Intake/Output Summary (Last 24 hours) at 11/09/13 1531 Last data filed at 11/09/13 1300  Gross  per 24 hour  Intake    480 ml  Output   2100 ml  Net  -1620 ml    Labs:   Recent Labs Lab 11/05/13 0403  11/06/13 0500 11/07/13 0348 11/08/13 0540 11/09/13 0310  NA 142  < > 143 142 138 135*  K 2.6*  < > 3.2* 3.3* 3.8 3.7  CL 102  < > 106 104 104 99  CO2 28  < > 29 27 25 24   BUN 19  < > 19 20 18 18   CREATININE 0.93  < > 0.84 0.77 0.80 0.80  CALCIUM 7.9*  < > 7.9* 8.1* 7.9* 8.0*  MG 1.8  --   --  1.7 1.6  --   PHOS 2.0*  --  3.2 3.1 2.5  --   GLUCOSE 108*  < > 99 138* 89 101*  < > = values in this interval not displayed.  CBG (last 3)   Recent Labs  11/08/13 2348 11/09/13 0430 11/09/13 1141  GLUCAP 97 103* 128*    Scheduled Meds: . antiseptic oral rinse  15 mL Mouth Rinse QID  . chlorhexidine  15 mL Mouth Rinse BID  . insulin aspart  0-20 Units Subcutaneous 6 times per day  . ipratropium-albuterol  3 mL Nebulization BID  . levothyroxine  175 mcg Oral QAC breakfast  . pantoprazole  40 mg Oral Q1200  . tamsulosin  0.4 mg Oral Daily    Continuous Infusions: . sodium chloride 20 mL/hr (10/31/13 1847)  . sodium chloride 20 mL/hr at 11/07/13 1000    Past Medical History  Diagnosis Date  . History of colon polyps 2006,2008  .  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 Hyperlipidemia     takes Pravastatin  . Emphysema   . Lung cancer     right upper lobe  . Enlarged prostate     takes Rapaflo daily  . Arthritis     left knee and back arthrtis  . Hypothyroidism     takes Synthroid daily  . Cataract   . Hx of radiation therapy 09/02/11 to 10/19/11    chest  . Diverticulosis   . Cirrhosis     By radiography, seen on CT  . Rosacea   . CAD (coronary artery disease)   . Shortness of breath   . Heart murmur     teen    Past Surgical History  Procedure Laterality Date  . Hernia repair  70's  . Vasectomy  70's  . Finger surgery  2008    left pointer finger  . Knee arthroscopy  2008    left knee  . Tonsillectomy      as a child  . Colonoscopy w/ biopsies       multiple   . Cardiac catheterization  2003  . Esophagogastroduodenoscopy  2006  . Portacath placement  08/10/2011    Procedure: INSERTION PORT-A-CATH;  Surgeon: Pierre Bali, MD;  Location: [place];  Service: Thoracic;  Laterality: Left;  Left Subclavian  . Abdominal aortic aneurysm repair N/A 10/31/2013    Procedure: aorto to left external iliac and right external iliac and left internal iliac, reimplantation of inferior mesenteric artery and ligation of left iliac artery aneursym;  Surgeon: Elam Dutch, MD;  Location: [place];  Service: Vascular;  Laterality: N/A;  . Thrombectomy iliac artery Bilateral 10/31/2013    Procedure: THROMBECTOMY ILIAC ARTERY;  Surgeon: Elam Dutch, MD;  Location: [place];  Service: Vascular;  Laterality: Bilateral;    Arthur Holms, RD, LDN Pager #: [phone] After-Hours Pager #: [phone]

## 2013-11-09 NOTE — Care Management Note (Signed)
    Page 1 of 2   11/13/2013     4:37:02 PM   CARE MANAGEMENT NOTE 11/13/2013  Patient:  Joseph [name], Joseph [name]   Account Number:  [id number]  Date Initiated:  11/01/2013  Documentation initiated by:  MAYO,HENRIETTA  Subjective/Objective Assessment:   dx AAA  lives with spouse    PCP  Dr Viona Gilmore Lutricia Feil     Action/Plan:   MET WITH PT AND SPOUSE TO DISCUSS HOME CARE; WIFE CHOOSES [facility].  REQUESTS 3 IN 1 FOR HOME. REFERRAL TO [facility] FOR HH FOLLOW UP; REFERRAL TO [facility] FOR DME FOLLOW UP.   Anticipated DC Date:  11/13/2013   Anticipated DC Plan:  [place]  CM consult      [facility] Choice  HOME HEALTH   Choice offered to / List presented to:  C-3 Spouse   DME arranged  [place]      DME agency  [facility] arranged  HH-1 RN  [place] agency  [facility]   Status of service:  Completed, signed off Medicare Important Message given?   (If response is "NO", the following Medicare IM given date fields will be blank) Date Medicare IM given:   Date Additional Medicare IM given:    Discharge Disposition:  [place]  Per UR Regulation:  Reviewed for med. necessity/level of care/duration of stay  If discussed at [place] of Stay Meetings, dates discussed:   11/07/2013  11/09/2013    Comments:  11/13/13 Stephanine Reas,RN,BSN [phone] PT FOR DC HOME TODAY WITH WIFE AND HH SERVICES AS ARRANGED. WILL NEED HOME OXYGEN SET UP, AS CONT TO [place].  PORTABLE O2 AND BSC DELIVERED TO ROOM PRIOR TO DC BY [facility].  NOTIFIED [place].  START OF CARE 24-48H POST DC DATE.  11/06/13 1100 Henrietta Mayo RN MSN BSN CCM Remains on amio gtt for AFib w/RVR, fentanyl PCA, IV lasix.

## 2013-11-09 NOTE — Progress Notes (Addendum)
Called into room for bleeding on sheets. Upon assessment, midabdominal incision was leaking bloody drainage. Staples intact and edges approximated. VSS. Nonadherent dsg applied. MD paged. MD to see pt. Will continue to monitor pt closely.

## 2013-11-09 NOTE — Progress Notes (Addendum)
[facility] AAA Progress Note  11/09/2013 9:22 AM 9 Days Post-Op  Subjective:  Feeling better    Filed Vitals:   11/09/13 0433  BP: 119/66  Pulse: 94  Temp: 98.8 F (37.1 C)  Resp: 18    Physical Exam: Cardiac:  regular Lungs:  CTAB Abdomen:  Soft NT/ND + BS Incisions:  C/d/i with staples in tact Extremities:  + palpable DP bilaterally; + BLE edema  CBC    Component Value Date/Time   WBC 8.4 11/09/2013 0310   WBC 5.1 07/17/2013 0856   RBC 3.23* 11/09/2013 0310   RBC 4.35 07/17/2013 0856   HGB 10.7* 11/09/2013 0310   HGB 14.6 07/17/2013 0856   HCT 31.3* 11/09/2013 0310   HCT 43.2 07/17/2013 0856   PLT 106* 11/09/2013 0310   PLT 134* 07/17/2013 0856   MCV 96.9 11/09/2013 0310   MCV 99.2* 07/17/2013 0856   MCH 33.1 11/09/2013 0310   MCH 33.6* 07/17/2013 0856   MCHC 34.2 11/09/2013 0310   MCHC 33.8 07/17/2013 0856   RDW 15.9* 11/09/2013 0310   RDW 14.0 07/17/2013 0856   LYMPHSABS 1.2 07/17/2013 0856   MONOABS 0.5 07/17/2013 0856   EOSABS 0.1 07/17/2013 0856   BASOSABS 0.0 07/17/2013 0856    BMET    Component Value Date/Time   NA 135* 11/09/2013 0310   NA 141 07/17/2013 0856   K 3.7 11/09/2013 0310   K 4.2 07/17/2013 0856   CL 99 11/09/2013 0310   CL 109* 08/01/2012 1046   CO2 24 11/09/2013 0310   CO2 23 07/17/2013 0856   GLUCOSE 101* 11/09/2013 0310   GLUCOSE 94 07/17/2013 0856   GLUCOSE 93 08/01/2012 1046   BUN 18 11/09/2013 0310   BUN 14.7 07/17/2013 0856   CREATININE 0.80 11/09/2013 0310   CREATININE 1.00 08/17/2013 1250   CREATININE 0.9 07/17/2013 0856   CALCIUM 8.0* 11/09/2013 0310   CALCIUM 8.9 07/17/2013 0856   GFRNONAA 88* 11/09/2013 0310   GFRAA >90 11/09/2013 0310    INR    Component Value Date/Time   INR 1.29 11/01/2013 1706     Intake/Output Summary (Last 24 hours) at 11/09/13 0922 Last data filed at 11/09/13 0700  Gross per 24 hour  Intake    840 ml  Output   2450 ml  Net  -1610 ml   U/A negative with exception of large  hgb urine  Assessment/Plan:  73 y.o. male is s/p  Aorto to left and right external iliac bypass, left internal iliac artery bypass, ligation right internal iliac aneurysm, reimplantation of inferior mesenteric artery. Thrombectomy of left and right iliac and femoral arteries 9 Days Post-Op  -fecal occult blood is negative -d/c fecal tube -increase ambulation -urine negative -occult blood negative -pt not weighed recently-good diuresis yesterday -acute blood loss anemia improved.   Leontine Locket, PA-C [facility] [phone] 11/09/2013 9:22 AM  I agree with the above.  The patient's fecal of cold blood test was negative.  He has not had any diarrhea today.  His rectal tube in Foley catheter are removed.  He is tolerating a regular diet.  His pain is well controlled.  He is ambulating.  We discussed possibly going home tomorrow, however he feels very weak and is looking towards Saturday.  Annamarie Major

## 2013-11-09 NOTE — Progress Notes (Signed)
VASCULAR SURGERY:  Called to see patient because of drainage from the lower part of the incision. Patient has some thin bloody drainage from the lower part of the incision. I removed one staple and probed the wound with a Q-tip. The fascia appears to be intact and I suspect this is just a liquefied subcutaneous hematoma. This was packed with gauze and we will do dressing changes as needed. At this point I do not think that he has had a fascial dehiscence.  Deitra Mayo, MD, FACS Bee[facility] [phone] 11/09/2013

## 2013-11-10 ENCOUNTER — Encounter (HOSPITAL_COMMUNITY): Payer: Self-pay | Admitting: Certified Registered"

## 2013-11-10 ENCOUNTER — Inpatient Hospital Stay (HOSPITAL_COMMUNITY): Payer: Medicare Other | Admitting: Certified Registered"

## 2013-11-10 ENCOUNTER — Encounter (HOSPITAL_COMMUNITY)
Admission: RE | Disposition: A | Discharge status: Home / Self Care | Payer: Self-pay | Source: Ambulatory Visit | Attending: Vascular Surgery

## 2013-11-10 ENCOUNTER — Encounter (HOSPITAL_COMMUNITY): Payer: Medicare Other | Admitting: Certified Registered"

## 2013-11-10 DIAGNOSIS — Q7959 Other congenital malformations of abdominal wall: Secondary | ICD-10-CM

## 2013-11-10 HISTORY — PX: ABDOMINAL WOUND DEHISCENCE: SHX540

## 2013-11-10 LAB — GLUCOSE, CAPILLARY
Glucose-Capillary: 103 mg/dL — ABNORMAL HIGH (ref 70–99)
Glucose-Capillary: 110 mg/dL — ABNORMAL HIGH (ref 70–99)
Glucose-Capillary: 83 mg/dL (ref 70–99)

## 2013-11-10 SURGERY — REPAIR, DEHISCENCE, WOUND, ABDOMEN
Anesthesia: General | Site: Abdomen

## 2013-11-10 MED ORDER — PROPOFOL 10 MG/ML IV BOLUS
INTRAVENOUS | Status: AC
  Filled 2013-11-10: qty 20

## 2013-11-10 MED ORDER — ONDANSETRON HCL 4 MG/2ML IJ SOLN
INTRAMUSCULAR | Status: DC | PRN
  Administered 2013-11-10: 4 mg via INTRAVENOUS

## 2013-11-10 MED ORDER — GLYCOPYRROLATE 0.2 MG/ML IJ SOLN
INTRAMUSCULAR | Status: DC | PRN
  Administered 2013-11-10: 0.4 mg via INTRAVENOUS

## 2013-11-10 MED ORDER — FENTANYL CITRATE 0.05 MG/ML IJ SOLN
25.0000 ug | INTRAMUSCULAR | Status: DC | PRN
  Administered 2013-11-10 (×2): 50 ug via INTRAVENOUS
  Administered 2013-11-10 (×2): 25 ug via INTRAVENOUS

## 2013-11-10 MED ORDER — ROCURONIUM BROMIDE 100 MG/10ML IV SOLN
INTRAVENOUS | Status: DC | PRN
  Administered 2013-11-10: 20 mg via INTRAVENOUS
  Administered 2013-11-10: 10 mg via INTRAVENOUS

## 2013-11-10 MED ORDER — PROPOFOL 10 MG/ML IV BOLUS
INTRAVENOUS | Status: DC | PRN
  Administered 2013-11-10: 160 mg via INTRAVENOUS

## 2013-11-10 MED ORDER — ONDANSETRON HCL 4 MG/2ML IJ SOLN: 4.0000 mg | Freq: Once | INTRAMUSCULAR | Status: DC | PRN

## 2013-11-10 MED ORDER — OXYCODONE HCL 5 MG PO TABS: 5.0000 mg | ORAL_TABLET | Freq: Once | ORAL | Status: DC | PRN

## 2013-11-10 MED ORDER — SUCCINYLCHOLINE CHLORIDE 20 MG/ML IJ SOLN
INTRAMUSCULAR | Status: AC
  Filled 2013-11-10: qty 1

## 2013-11-10 MED ORDER — ACETAMINOPHEN 325 MG PO TABS: 325.0000 mg | ORAL_TABLET | ORAL | Status: DC | PRN

## 2013-11-10 MED ORDER — MIDAZOLAM HCL 2 MG/2ML IJ SOLN
INTRAMUSCULAR | Status: AC
  Filled 2013-11-10: qty 2

## 2013-11-10 MED ORDER — LIDOCAINE HCL (CARDIAC) 20 MG/ML IV SOLN
INTRAVENOUS | Status: AC
  Filled 2013-11-10: qty 5

## 2013-11-10 MED ORDER — FENTANYL CITRATE 0.05 MG/ML IJ SOLN
INTRAMUSCULAR | Status: AC
  Filled 2013-11-10: qty 5

## 2013-11-10 MED ORDER — OXYCODONE HCL 5 MG/5ML PO SOLN: 5.0000 mg | Freq: Once | ORAL | Status: DC | PRN

## 2013-11-10 MED ORDER — FENTANYL CITRATE 0.05 MG/ML IJ SOLN
INTRAMUSCULAR | Status: AC
  Filled 2013-11-10: qty 2

## 2013-11-10 MED ORDER — ROCURONIUM BROMIDE 50 MG/5ML IV SOLN
INTRAVENOUS | Status: AC
Start: 2013-11-10 — End: 2013-11-10
  Filled 2013-11-10: qty 1

## 2013-11-10 MED ORDER — LIDOCAINE HCL (CARDIAC) 20 MG/ML IV SOLN
INTRAVENOUS | Status: DC | PRN
  Administered 2013-11-10: 80 mg via INTRAVENOUS

## 2013-11-10 MED ORDER — LACTATED RINGERS IV SOLN
INTRAVENOUS | Status: DC | PRN
  Administered 2013-11-10: 14:00:00 via INTRAVENOUS

## 2013-11-10 MED ORDER — FENTANYL CITRATE 0.05 MG/ML IJ SOLN
INTRAMUSCULAR | Status: DC | PRN
  Administered 2013-11-10: 50 ug via INTRAVENOUS

## 2013-11-10 MED ORDER — ACETAMINOPHEN 160 MG/5ML PO SOLN
325.0000 mg | ORAL | Status: DC | PRN
  Filled 2013-11-10: qty 20.3

## 2013-11-10 MED ORDER — ONDANSETRON HCL 4 MG/2ML IJ SOLN
INTRAMUSCULAR | Status: AC
  Filled 2013-11-10: qty 2

## 2013-11-10 MED ORDER — NEOSTIGMINE METHYLSULFATE 1 MG/ML IJ SOLN
INTRAMUSCULAR | Status: AC
  Filled 2013-11-10: qty 10

## 2013-11-10 MED ORDER — GLYCOPYRROLATE 0.2 MG/ML IJ SOLN
INTRAMUSCULAR | Status: AC
  Filled 2013-11-10: qty 2

## 2013-11-10 MED ORDER — CEFAZOLIN SODIUM 1-5 GM-% IV SOLN
1.0000 g | INTRAVENOUS | Status: AC
  Administered 2013-11-10: 2 g via INTRAVENOUS

## 2013-11-10 MED ORDER — PHENYLEPHRINE HCL 10 MG/ML IJ SOLN
INTRAMUSCULAR | Status: DC | PRN
  Administered 2013-11-10: 120 ug via INTRAVENOUS
  Administered 2013-11-10 (×4): 80 ug via INTRAVENOUS
  Administered 2013-11-10: 120 ug via INTRAVENOUS
  Administered 2013-11-10 (×3): 80 ug via INTRAVENOUS

## 2013-11-10 MED ORDER — 0.9 % SODIUM CHLORIDE (POUR BTL) OPTIME
TOPICAL | Status: DC | PRN
  Administered 2013-11-10: 1000 mL

## 2013-11-10 MED ORDER — PHENYLEPHRINE 40 MCG/ML (10ML) SYRINGE FOR IV PUSH (FOR BLOOD PRESSURE SUPPORT)
PREFILLED_SYRINGE | INTRAVENOUS | Status: AC
  Filled 2013-11-10: qty 20

## 2013-11-10 MED ORDER — HYDROCODONE-ACETAMINOPHEN 5-325 MG PO TABS: 1.0000 | ORAL_TABLET | Freq: Four times a day (QID) | ORAL | Status: DC | PRN

## 2013-11-10 MED ORDER — NEOSTIGMINE METHYLSULFATE 1 MG/ML IJ SOLN
INTRAMUSCULAR | Status: DC | PRN
  Administered 2013-11-10: 3 mg via INTRAVENOUS

## 2013-11-10 MED ORDER — SUCCINYLCHOLINE CHLORIDE 20 MG/ML IJ SOLN
INTRAMUSCULAR | Status: DC | PRN
  Administered 2013-11-10: 100 mg via INTRAVENOUS

## 2013-11-10 SURGICAL SUPPLY — 43 items
BLADE SURG 10 STRL SS (BLADE) ×1 IMPLANT
CANISTER SUCTION 2500CC (MISCELLANEOUS) ×2 IMPLANT
COVER SURGICAL LIGHT HANDLE (MISCELLANEOUS) ×2 IMPLANT
DRAIN PENROSE 1/2X36 STERILE (WOUND CARE) IMPLANT
DRAPE LAPAROSCOPIC ABDOMINAL (DRAPES) ×2 IMPLANT
DRAPE PROXIMA HALF (DRAPES) ×4 IMPLANT
DRAPE WARM FLUID 44X44 (DRAPE) ×2 IMPLANT
DRSG COVADERM 4X10 (GAUZE/BANDAGES/DRESSINGS) ×1 IMPLANT
DRSG COVADERM 4X14 (GAUZE/BANDAGES/DRESSINGS) ×1 IMPLANT
DRSG PAD ABDOMINAL 8X10 ST (GAUZE/BANDAGES/DRESSINGS) IMPLANT
ELECT REM PT RETURN 9FT ADLT (ELECTROSURGICAL) ×2
ELECTRODE REM PT RTRN 9FT ADLT (ELECTROSURGICAL) ×1 IMPLANT
GAUZE PACKING IODOFORM 1 (PACKING) IMPLANT
GLOVE BIOGEL PI IND STRL 6.5 (GLOVE) IMPLANT
GLOVE BIOGEL PI IND STRL 7.5 (GLOVE) ×1 IMPLANT
GLOVE BIOGEL PI INDICATOR 6.5 (GLOVE) ×2
GLOVE BIOGEL PI INDICATOR 7.5 (GLOVE) ×2
GLOVE ECLIPSE 6.0 STRL STRAW (GLOVE) ×1 IMPLANT
GLOVE SS BIOGEL STRL SZ 7 (GLOVE) IMPLANT
GLOVE SS N UNI LF 7.5 STRL (GLOVE) ×2 IMPLANT
GLOVE SUPERSENSE BIOGEL SZ 7 (GLOVE) ×1
GOWN STRL REUS W/ TWL LRG LVL3 (GOWN DISPOSABLE) ×3 IMPLANT
GOWN STRL REUS W/ TWL XL LVL3 (GOWN DISPOSABLE) ×1 IMPLANT
GOWN STRL REUS W/TWL LRG LVL3 (GOWN DISPOSABLE) ×6
GOWN STRL REUS W/TWL XL LVL3 (GOWN DISPOSABLE) ×2
KIT BASIN OR (CUSTOM PROCEDURE TRAY) ×2 IMPLANT
KIT ROOM TURNOVER OR (KITS) ×2 IMPLANT
NS IRRIG 1000ML POUR BTL (IV SOLUTION) ×4 IMPLANT
PACK GENERAL/GYN (CUSTOM PROCEDURE TRAY) ×2 IMPLANT
PACK UNIVERSAL I (CUSTOM PROCEDURE TRAY) ×1 IMPLANT
PAD ARMBOARD 7.5X6 YLW CONV (MISCELLANEOUS) ×4 IMPLANT
SPONGE GAUZE 4X4 12PLY (GAUZE/BANDAGES/DRESSINGS) IMPLANT
SPONGE LAP 18X18 X RAY DECT (DISPOSABLE) ×1 IMPLANT
STAPLER VISISTAT 35W (STAPLE) ×1 IMPLANT
STRAP MONTGOMERY 1.25X11-1/8 (MISCELLANEOUS) IMPLANT
SUT PDS AB 1 TP1 54 (SUTURE) ×1 IMPLANT
SUT PROLENE 2 TP 1 (SUTURE) ×2 IMPLANT
SWAB COLLECTION DEVICE MRSA (MISCELLANEOUS) IMPLANT
TAPE UMBILICAL COTTON 1/8X30 (MISCELLANEOUS) IMPLANT
TOWEL OR 17X24 6PK STRL BLUE (TOWEL DISPOSABLE) ×2 IMPLANT
TOWEL OR 17X26 10 PK STRL BLUE (TOWEL DISPOSABLE) ×3 IMPLANT
TUBE ANAEROBIC SPECIMEN COL (MISCELLANEOUS) IMPLANT
WATER STERILE IRR 1000ML POUR (IV SOLUTION) ×2 IMPLANT

## 2013-11-10 NOTE — Progress Notes (Signed)
PT Cancellation Note  Patient Details Name: Joseph [name] MRN: [id number] DOB: December 05, 1940   Cancelled Treatment:    Reason Eval/Treat Not Completed: Patient at procedure or test/unavailable  11/10/2013  Donnella Sham, PT [phone]  (pager) Bhavana Kady, Tessie Fass 11/10/2013, 4:56 PM

## 2013-11-10 NOTE — Transfer of Care (Signed)
Immediate Anesthesia Transfer of Care Note  Patient: Joseph [name]  Procedure(s) Performed: Procedure(s): ABDOMINAL WOUND CLOSURE  (N/A)  Patient Location: PACU  Anesthesia Type:General  Level of Consciousness: awake, alert  and patient cooperative  Airway & Oxygen Therapy: Patient Spontanous Breathing and Patient connected to nasal cannula oxygen  Post-op Assessment: Report given to PACU RN and Post -op Vital signs reviewed and stable  Post vital signs: Reviewed and stable  Complications: No apparent anesthesia complications

## 2013-11-10 NOTE — Interval H&P Note (Signed)
History and Physical Interval Note:  11/10/2013 1:50 PM  Joseph [name]  has presented today for surgery, with the diagnosis of abd closure  The various methods of treatment have been discussed with the patient and family. After consideration of risks, benefits and other options for treatment, the patient has consented to  Procedure(s): ABDOMINAL WOUND DEHISCENCE (N/A) as a surgical intervention .  The patient's history has been reviewed, patient examined, no change in status, stable for surgery.  I have reviewed the patient's chart and labs.  Questions were answered to the patient's satisfaction.     Joseph [name] IV, V. WELLS

## 2013-11-10 NOTE — Progress Notes (Addendum)
[facility] [place]  Subjective  - Worried about drainage.  Voiding, taking PO and slowly increasing activity.  Complains of being hot then cold.  Temp is 99.0 today.   Objective 123/61 102 99 F (37.2 C) (Oral) 16 91%  Intake/Output Summary (Last 24 hours) at 11/10/13 1036 Last data filed at 11/10/13 0700  Gross per 24 hour  Intake    720 ml  Output    850 ml  Net   -130 ml    Gauze removed from abdomin and released SS drainage.  Palpation of abdomin resulted in min. Drainage.  Wet to dry dressing packed and placed over opening in incision. No erythema, no hematoma. Distally feet are warm and well perfused.   Assessment/Planning: POD #10 Aorto to left and right external iliac bypass, left internal iliac artery bypass, ligation right internal iliac aneurysm, reimplantation of inferior mesenteric artery. Thrombectomy of left and right iliac and femoral arteries Wet to dry packing at drainage site in abdominal incision. Continue with advancing ambulation. acute blood loss anemia improved    Laurence Slate [facility] 11/10/2013 10:36 AM --  Laboratory Lab Results:  Recent Labs  11/08/13 0540 11/09/13 0310  WBC 5.5 8.4  HGB 9.6* 10.7*  HCT 28.5* 31.3*  PLT 99* 106*   BMET  Recent Labs  11/08/13 0540 11/09/13 0310  NA 138 135*  K 3.8 3.7  CL 104 99  CO2 25 24  GLUCOSE 89 101*  BUN 18 18  CREATININE 0.80 0.80  CALCIUM 7.9* 8.0*    COAG Lab Results  Component Value Date   INR 1.29 11/01/2013   INR 1.37 11/01/2013   INR 1.32 10/31/2013   No results found for this basename: PTT      I agree with the above.  I changed his midline abdominal incision today.  There was saturation of the dressing.  I probed the wound and did not feel a fascial defect.  Anticipate discharge tomorrow  Annamarie Major

## 2013-11-10 NOTE — Progress Notes (Addendum)
The patient had another episode of drainage from his midline abdominal incision.  I went back and reexplored the wound.  I removed the existing packing.  I used a Q-tip to probe his abdominal wall fascia.  I could not find a defect.  I discussed with the family that this could potentially be an abdominal wall dehiscence but I have no objective evidence of this currently.  Therefore, I recommended re- packing the wound and continuing with wound care.  Shortly thereafter, the patient went for a walk and had significant drainage from his wound.  I feel that since this is the third event, this warrants exploration the operating room to rule out fascial dehiscence.  I discussed this with the patient and his wife.  They wished to proceed.   Joseph [name]

## 2013-11-10 NOTE — H&P (View-Only) (Signed)
The patient had another episode of drainage from his midline abdominal incision.  I went back and reexplored the wound.  I removed the existing packing.  I used a Q-tip to probe his abdominal wall fascia.  I could not find a defect.  I discussed with the family that this could potentially be an abdominal wall dehiscence but I have no objective evidence of this currently.  Therefore, I recommended re- packing the wound and continuing with wound care.  Shortly thereafter, the patient went for a walk and had significant drainage from his wound.  I feel that since this is the third event, this warrants exploration the operating room to rule out fascial dehiscence.  I discussed this with the patient and his wife.  They wished to proceed.   Annamarie Major

## 2013-11-10 NOTE — Progress Notes (Signed)
OT Cancellation Note  Patient Details Name: Joseph [name] MRN: [id number] DOB: 02/01/1941   Cancelled Treatment:    Reason Eval/Treat Not Completed: Medical issues which prohibited therapy.  Pt with significantly increased drainage from incision when up ambulating per RN report.  Plans to return to OR later today for sx. Will check back tomorrow.  Continues to anticipate possible d/c home over weekend.   11/10/2013 Darrol Jump OTR/L Pager [phone] Office [phone]

## 2013-11-10 NOTE — Anesthesia Preprocedure Evaluation (Addendum)
Anesthesia Evaluation  Patient identified by MRN, date of birth, ID band Patient awake    Reviewed: Allergy & Precautions, H&P , NPO status , Patient's Chart, lab work & pertinent test results  History of Anesthesia Complications Negative for: history of anesthetic complications  Airway Mallampati: II TM Distance: >3 FB Neck ROM: Full    Dental  (+) Teeth Intact   Pulmonary neg sleep apnea, COPD COPD inhaler, former smoker,  breath sounds clear to auscultation        Cardiovascular hypertension, Pt. on medications and Pt. on home beta blockers - angina+ CAD and + Peripheral Vascular Disease + dysrhythmias Atrial Fibrillation + Valvular Problems/Murmurs AS Rhythm:Regular Rate:Normal  10 days s/p AAA repair with wound dehiscence    Neuro/Psych negative neurological ROS  negative psych ROS   GI/Hepatic negative GI ROS, Neg liver ROS,   Endo/Other  Hypothyroidism   Renal/GU negative Renal ROS     Musculoskeletal   Abdominal   Peds  Hematology  (+) anemia ,   Anesthesia Other Findings   Reproductive/Obstetrics                         Anesthesia Physical Anesthesia Plan  ASA: III  Anesthesia Plan: General   Post-op Pain Management:    Induction: Intravenous  Airway Management Planned: Oral ETT  Additional Equipment:   Intra-op Plan:   Post-operative Plan: Extubation in OR  Informed Consent: I have reviewed the patients History and Physical, chart, labs and discussed the procedure including the risks, benefits and alternatives for the proposed anesthesia with the patient or authorized representative who has indicated his/her understanding and acceptance.   Dental advisory given  Plan Discussed with: Anesthesiologist, Surgeon and CRNA  Anesthesia Plan Comments:        Anesthesia Quick Evaluation

## 2013-11-10 NOTE — Progress Notes (Signed)
During report this morning this patient's dressing became saturated with blood and night nurse changed the dressing per MD order. Patient laying in bed and asymptomatic. Paged Gerri Lins, PA regarding the drainage. She redressed and packed incision this morning. Patient wanted to go for a walk and the NT and I witnessed blood draining from his incision (approx. 20-40 cc's on the floor, wash rags and guaze). Dr. Trula Slade came in shortly after and stated that he recently redressed the incision as well and examined. He repacked the incision and I redressed with several abdominal pads and guaze. We started walking in the hallway and it started draining again. Called [place], [place] again and she assessed and called Dr. Trula Slade again. Patient due to go to the OR for abdominal closure. Will continue to monitor closely. [place] Nurse, RN

## 2013-11-11 LAB — GLUCOSE, CAPILLARY
GLUCOSE-CAPILLARY: 89 mg/dL (ref 70–99)
Glucose-Capillary: 83 mg/dL (ref 70–99)
Glucose-Capillary: 92 mg/dL (ref 70–99)
Glucose-Capillary: 95 mg/dL (ref 70–99)
Glucose-Capillary: 96 mg/dL (ref 70–99)

## 2013-11-11 MED ORDER — KETOROLAC TROMETHAMINE 30 MG/ML IJ SOLN
30.0000 mg | Freq: Four times a day (QID) | INTRAMUSCULAR | Status: DC
  Administered 2013-11-11 – 2013-11-13 (×8): 30 mg via INTRAVENOUS
  Filled 2013-11-11: qty 1
  Filled 2013-11-11: qty 2
  Filled 2013-11-11 (×3): qty 1
  Filled 2013-11-11: qty 2
  Filled 2013-11-11 (×2): qty 1
  Filled 2013-11-11: qty 2
  Filled 2013-11-11 (×6): qty 1

## 2013-11-11 MED ORDER — WHITE PETROLATUM GEL
Status: DC | PRN
  Filled 2013-11-11: qty 5

## 2013-11-11 NOTE — Anesthesia Postprocedure Evaluation (Signed)
  Anesthesia Post-op Note  Patient: Joseph [name]  Procedure(s) Performed: Procedure(s): ABDOMINAL WOUND CLOSURE  (N/A)  Patient Location: PACU  Anesthesia Type:General  Level of Consciousness: awake, alert  and oriented  Airway and Oxygen Therapy: Patient Spontanous Breathing  Post-op Pain: mild  Post-op Assessment: Post-op Vital signs reviewed, Patient's Cardiovascular Status Stable, Respiratory Function Stable, Patent Airway, No signs of Nausea or vomiting and Pain level controlled  Post-op Vital Signs: Reviewed and stable  Complications: No apparent anesthesia complications

## 2013-11-11 NOTE — Progress Notes (Signed)
Subjective  - POD #1, status post repair of abdominal wall dehiscence  The patient is sore this morning. No drainage from his abdominal incision overnight.   Physical Exam:  Dressing remained clean dry and intact. Abdomen is soft but tender.       Assessment/Plan:  POD #1  The patient and had an abdominal wall dehiscence yesterday which required operative closure.  He has done well overnight but does have significant discomfort from his incision.  I have started him on Toradol to help with pain control.  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  Hopefully he will be ready for discharge Monday/Tuesday  Emmory Solivan IV, V. WELLS 11/11/2013 10:51 AM --  Filed Vitals:   11/11/13 0419  BP: 124/69  Pulse: 102  Temp: 99.2 F (37.3 C)  Resp: 20    Intake/Output Summary (Last 24 hours) at 11/11/13 1051 Last data filed at 11/11/13 0825  Gross per 24 hour  Intake    800 ml  Output    700 ml  Net    100 ml     Laboratory CBC    Component Value Date/Time   WBC 8.4 11/09/2013 0310   WBC 5.1 07/17/2013 0856   HGB 10.7* 11/09/2013 0310   HGB 14.6 07/17/2013 0856   HCT 31.3* 11/09/2013 0310   HCT 43.2 07/17/2013 0856   PLT 106* 11/09/2013 0310   PLT 134* 07/17/2013 0856    BMET    Component Value Date/Time   NA 135* 11/09/2013 0310   NA 141 07/17/2013 0856   K 3.7 11/09/2013 0310   K 4.2 07/17/2013 0856   CL 99 11/09/2013 0310   CL 109* 08/01/2012 1046   CO2 24 11/09/2013 0310   CO2 23 07/17/2013 0856   GLUCOSE 101* 11/09/2013 0310   GLUCOSE 94 07/17/2013 0856   GLUCOSE 93 08/01/2012 1046   BUN 18 11/09/2013 0310   BUN 14.7 07/17/2013 0856   CREATININE 0.80 11/09/2013 0310   CREATININE 1.00 08/17/2013 1250   CREATININE 0.9 07/17/2013 0856   CALCIUM 8.0* 11/09/2013 0310   CALCIUM 8.9 07/17/2013 0856   GFRNONAA 88* 11/09/2013 0310   GFRAA >90 11/09/2013 0310    COAG Lab Results  Component Value Date   INR 1.29 11/01/2013   INR 1.37 11/01/2013   INR 1.32 10/31/2013   No  results found for this basename: PTT    Antibiotics Anti-infectives   Start     Dose/Rate Route Frequency Ordered Stop   11/11/13 0600  [MAR Hold]  ceFAZolin (ANCEF) IVPB 1 g/50 mL premix     (On MAR Hold since 11/10/13 1357)  Comments:  Send with pt to OR   1 g 100 mL/hr over 30 Minutes Intravenous On call 11/10/13 1246 11/10/13 1435   11/04/13 1800  vancomycin (VANCOCIN) IVPB 1000 mg/200 mL premix  Status:  Discontinued     1,000 mg 200 mL/hr over 60 Minutes Intravenous Every 12 hours 11/04/13 1135 11/05/13 1108   11/02/13 0600  piperacillin-tazobactam (ZOSYN) IVPB 3.375 g  Status:  Discontinued     3.375 g 12.5 mL/hr over 240 Minutes Intravenous Every 8 hours 11/01/13 2304 11/05/13 1158   11/01/13 2315  vancomycin (VANCOCIN) IVPB 1000 mg/200 mL premix  Status:  Discontinued     1,000 mg 200 mL/hr over 60 Minutes Intravenous 3 times per day 11/01/13 2304 11/04/13 1135   11/01/13 2315  piperacillin-tazobactam (ZOSYN) IVPB 3.375 g     3.375 g 100 mL/hr over 30  Minutes Intravenous  Once 11/01/13 2304 11/02/13 0051   10/31/13 1800  cefUROXime (ZINACEF) 1.5 g in dextrose 5 % 50 mL IVPB     1.5 g 100 mL/hr over 30 Minutes Intravenous Every 12 hours 10/31/13 1738 11/01/13 0617   10/31/13 1615  cefUROXime (ZINACEF) 1.5 g in dextrose 5 % 50 mL IVPB  Status:  Discontinued     1.5 g 100 mL/hr over 30 Minutes Intravenous To Surgery 10/31/13 1611 10/31/13 1619   10/30/13 1252  cefUROXime (ZINACEF) 1.5 g in dextrose 5 % 50 mL IVPB  Status:  Discontinued     1.5 g 100 mL/hr over 30 Minutes Intravenous 30 min pre-op 10/30/13 1252 10/31/13 1619       V. Leia Alf, M.D. [facility] [place] Office: [phone] Pager:  [phone]

## 2013-11-11 NOTE — Progress Notes (Signed)
Physical Therapy Treatment Patient Details Name: Joseph [name] MRN: [id number] DOB: 1940-12-02 Today's Date: 11/11/2013 Time: 8242-3536 PT Time Calculation (min): 23 min  PT Assessment / Plan / Recommendation  History of Present Illness pt adm for elective surgical management of AAA.  Pt s/p  Aorto to left and right external iliac bypass, left internal iliac artery bypass, ligation right internal iliac aneurysm, reimplantation of inferior mesenteric artery. Thrombectomy of left and right iliac and femoral arteries.  Back 3/13 for repair of dehiscence.   PT Comments   Pt's general mobility progressing well and moving at a min guard level.  Saturations are running lower than prior to surgery.  From safety standpoint, pt and wife should be safe at home   Follow Up Recommendations  Home health PT;Supervision/Assistance - 24 hour     Does the patient have the potential to tolerate intense rehabilitation     Barriers to Discharge        Equipment Recommendations  None recommended by PT    Recommendations for Other Services    Frequency Min 3X/week   Progress towards PT Goals Progress towards PT goals: Progressing toward goals  Plan Current plan remains appropriate    Precautions / Restrictions     Pertinent Vitals/Pain sats at rest on 2L [place] at 88/89% HR 90's;  sats during gait on 3L dropped to 74% and 118 bpm.  Second trial with ambulation on 4L [place] at 77% and 115 bpm.  Approx. 2 minute recovery to 90%    Mobility  Transfers Overall transfer level: Needs assistance Transfers: Sit to/from Stand Sit to Stand: Min guard General transfer comment: cues for hand placement Ambulation/Gait Ambulation/Gait assistance: Min guard Ambulation Distance (Feet): 90 Feet (times 2 with RW) Assistive device: Rolling walker (2 wheeled) Gait Pattern/deviations: Step-through pattern Gait velocity interpretation: Below normal speed for age/gender General Gait Details: generally steady with  flexed posture and RW placed too far in front.    Exercises     PT Diagnosis:    PT Problem List:   PT Treatment Interventions:     PT Goals (current goals can now be found in the care plan section) Acute Rehab PT Goals Patient Stated Goal: I want to get home PT Goal Formulation: With patient Time For Goal Achievement: 11/14/13 Potential to Achieve Goals: Good  Visit Information  Last PT Received On: 11/11/13 Assistance Needed: +1 History of Present Illness: pt adm for elective surgical management of AAA.  Pt s/p  Aorto to left and right external iliac bypass, left internal iliac artery bypass, ligation right internal iliac aneurysm, reimplantation of inferior mesenteric artery. Thrombectomy of left and right iliac and femoral arteries.  Back 3/13 for repair of dehiscence.    Subjective Data  Subjective: He always has trouble coming off of the anesthesia Patient Stated Goal: I want to get home   Cognition  Cognition Arousal/Alertness: Awake/alert Behavior During Therapy: WFL for tasks assessed/performed Overall Cognitive Status: Within Functional Limits for tasks assessed    Balance  Balance Overall balance assessment: Needs assistance Sitting-balance support: No upper extremity supported Sitting balance-Leahy Scale: Good Standing balance support: Bilateral upper extremity supported;During functional activity Standing balance-Leahy Scale: Fair  End of Session PT - End of Session Equipment Utilized During Treatment: Oxygen Activity Tolerance: Patient tolerated treatment well;Patient limited by fatigue Patient left: in chair;with call bell/phone within reach;with family/visitor present Nurse Communication: Mobility status   GP     Caitlyne Ingham, Tessie Fass 11/11/2013, 2:28 PM 11/11/2013  Yvone Neu  [place], [place] [phone]  (pager)

## 2013-11-11 NOTE — Op Note (Signed)
    Patient name: Joseph [name] MRN: [id number] DOB: May 22, 1941 Sex: male  10/31/2013 - 11/10/2013 Pre-operative Diagnosis: Abdominal wall dehiscence Post-operative diagnosis:  Same Surgeon:  Eldridge Abrahams Assistants:  Lennie Muckle, PA Procedure:   Reexploration of recent laparotomy   #2: Repair of abdominal wall dehiscence Anesthesia:  Gen. Blood Loss:  See anesthesia record Specimens:  None  Findings:  In the location of the patient's previous hernia repair, intraperitoneal fat had protruded through the midline fascial closure.  Indications:  The patient is status post open repair of aortoiliac aneurysm.  Yesterday, he had drainage from his abdominal incision.  His incision was opened and packed.  He has had repeated episodes of significant serosanguineous drainage.  I presume that this represented a fascial dehiscence.  Therefore the decision was made to take him back to the operating room for exploration.  Procedure:  The patient was identified in the holding area and taken to [place] [facility]  The patient was then placed supine on the table. general anesthesia was administered.  The patient was prepped and draped in the usual sterile fashion.  A time out was called and antibiotics were administered.  Staples were removed from the lower portion of the patient's midline incision.  Upon removing staples, intraperitoneal fat was visualized protruding through the previously closed midline incision.  All sutures remained intact, however the sutures used to close the midline abdominal fascia or loops.  This suggested that one of the sutures had pulled through the fascia.  I cut the midline fascial sutures and opened the abdomen for exploration.  It appeared that intraperitoneal fat from the lining of the peritoneal surface had protruded through an area at the umbilicus which corresponded to his previous hernia repair.  There was no bowel protruding through the wound.  I did a limited  exploration of the abdomen.  All of the small bowel appeared healthy.  I could visualize the sigmoid colon.  This also appeared healthy.  I elected to close the abdomen with #1 PDS suture.  This was done in a running fashion, incorporating the pins of the original suture used to close the abdomen.  The area was relatively small so I did not think interrupted sutures were required.  I also did not want to place any kind of prosthetic material at this time.  Large bites of the fascia were taken.  Once the fascia was closed, I stapled the skin back.  Sterile dressings were applied.  There were no complications.   Disposition:  To PACU in stable condition.   Theotis Burrow, M.D. [facility] Office: [phone] Pager:  [phone]

## 2013-11-11 NOTE — Progress Notes (Signed)
Patient has a flat affect, communicates appropriately but sits in his room in total darkness. No light or television on. When ask if there is something we can do pt stated that he is ok. We will continue to monitor.

## 2013-11-12 LAB — CBC
HCT: 30.2 % — ABNORMAL LOW (ref 39.0–52.0)
Hemoglobin: 10.1 g/dL — ABNORMAL LOW (ref 13.0–17.0)
MCH: 32.8 pg (ref 26.0–34.0)
MCHC: 33.4 g/dL (ref 30.0–36.0)
MCV: 98.1 fL (ref 78.0–100.0)
PLATELETS: 148 10*3/uL — AB (ref 150–400)
RBC: 3.08 MIL/uL — AB (ref 4.22–5.81)
RDW: 16.8 % — ABNORMAL HIGH (ref 11.5–15.5)
WBC: 7.3 10*3/uL (ref 4.0–10.5)

## 2013-11-12 LAB — BASIC METABOLIC PANEL
BUN: 14 mg/dL (ref 6–23)
CHLORIDE: 95 meq/L — AB (ref 96–112)
CO2: 24 mEq/L (ref 19–32)
CREATININE: 0.91 mg/dL (ref 0.50–1.35)
Calcium: 7.8 mg/dL — ABNORMAL LOW (ref 8.4–10.5)
GFR calc Af Amer: >90 mL/min (ref >90)
GFR calc non Af Amer: 84 mL/min — ABNORMAL LOW (ref >90)
Glucose, Bld: 100 mg/dL — ABNORMAL HIGH (ref 70–99)
Potassium: 3.6 mEq/L — ABNORMAL LOW (ref 3.7–5.3)
Sodium: 131 mEq/L — ABNORMAL LOW (ref 137–147)

## 2013-11-12 LAB — GLUCOSE, CAPILLARY
GLUCOSE-CAPILLARY: 103 mg/dL — AB (ref 70–99)
GLUCOSE-CAPILLARY: 95 mg/dL (ref 70–99)
Glucose-Capillary: 95 mg/dL (ref 70–99)
Glucose-Capillary: 100 mg/dL — ABNORMAL HIGH (ref 70–99)
Glucose-Capillary: 104 mg/dL — ABNORMAL HIGH (ref 70–99)

## 2013-11-12 MED ORDER — LEVOFLOXACIN 500 MG PO TABS
500.0000 mg | ORAL_TABLET | Freq: Every day | ORAL | Status: DC
  Administered 2013-11-12 – 2013-11-13 (×2): 500 mg via ORAL
  Filled 2013-11-12 (×2): qty 1

## 2013-11-12 MED ORDER — INSULIN ASPART 100 UNIT/ML ~~LOC~~ SOLN: 0.0000 [IU] | Freq: Three times a day (TID) | SUBCUTANEOUS | Status: DC

## 2013-11-12 MED ORDER — FUROSEMIDE 10 MG/ML IJ SOLN
20.0000 mg | Freq: Once | INTRAMUSCULAR | Status: AC
  Administered 2013-11-12: 20 mg via INTRAVENOUS

## 2013-11-12 NOTE — Progress Notes (Signed)
Subjective  - POD #12/2, status post aneurysm repair and repeat abdominal wall closure  Pain is better controlled today with toradol Positive flatus.  Tolerate a clear diet.   Physical Exam:  Slight erythema at the abdominal wall closure site.  No drainage. Abdomen is soft. Extremities are warm well perfused.   Assessment/Plan:  POD #12/2  I will prophylactically placed him on antibiotics for possible cellulitis at the abdominal wall closure site.  This area was being packed prior to his abdominal wall closure.  If the cellulitis is not improved tomorrow he may require opening of the inferior aspect of his incision.  I discussed this with the family.  We discussed possible discharge tomorrow vs. Tuesday.  Nader Boys IV, V. WELLS 11/12/2013 11:22 AM --  Filed Vitals:   11/12/13 0500  BP: 95/51  Pulse: 90  Temp: 98.1 F (36.7 C)  Resp:     Intake/Output Summary (Last 24 hours) at 11/12/13 1122 Last data filed at 11/11/13 1335  Gross per 24 hour  Intake      0 ml  Output    200 ml  Net   -200 ml     Laboratory CBC    Component Value Date/Time   WBC 7.3 11/12/2013 0425   WBC 5.1 07/17/2013 0856   HGB 10.1* 11/12/2013 0425   HGB 14.6 07/17/2013 0856   HCT 30.2* 11/12/2013 0425   HCT 43.2 07/17/2013 0856   PLT 148* 11/12/2013 0425   PLT 134* 07/17/2013 0856    BMET    Component Value Date/Time   NA 131* 11/12/2013 0425   NA 141 07/17/2013 0856   K 3.6* 11/12/2013 0425   K 4.2 07/17/2013 0856   CL 95* 11/12/2013 0425   CL 109* 08/01/2012 1046   CO2 24 11/12/2013 0425   CO2 23 07/17/2013 0856   GLUCOSE 100* 11/12/2013 0425   GLUCOSE 94 07/17/2013 0856   GLUCOSE 93 08/01/2012 1046   BUN 14 11/12/2013 0425   BUN 14.7 07/17/2013 0856   CREATININE 0.91 11/12/2013 0425   CREATININE 1.00 08/17/2013 1250   CREATININE 0.9 07/17/2013 0856   CALCIUM 7.8* 11/12/2013 0425   CALCIUM 8.9 07/17/2013 0856   GFRNONAA 84* 11/12/2013 0425   GFRAA >90 11/12/2013 0425     COAG Lab Results  Component Value Date   INR 1.29 11/01/2013   INR 1.37 11/01/2013   INR 1.32 10/31/2013   No results found for this basename: PTT    Antibiotics Anti-infectives   Start     Dose/Rate Route Frequency Ordered Stop   11/11/13 0600  [MAR Hold]  ceFAZolin (ANCEF) IVPB 1 g/50 mL premix     (On MAR Hold since 11/10/13 1357)  Comments:  Send with pt to OR   1 g 100 mL/hr over 30 Minutes Intravenous On call 11/10/13 1246 11/10/13 1435   11/04/13 1800  vancomycin (VANCOCIN) IVPB 1000 mg/200 mL premix  Status:  Discontinued     1,000 mg 200 mL/hr over 60 Minutes Intravenous Every 12 hours 11/04/13 1135 11/05/13 1108   11/02/13 0600  piperacillin-tazobactam (ZOSYN) IVPB 3.375 g  Status:  Discontinued     3.375 g 12.5 mL/hr over 240 Minutes Intravenous Every 8 hours 11/01/13 2304 11/05/13 1158   11/01/13 2315  vancomycin (VANCOCIN) IVPB 1000 mg/200 mL premix  Status:  Discontinued     1,000 mg 200 mL/hr over 60 Minutes Intravenous 3 times per day 11/01/13 2304 11/04/13 1135   11/01/13 2315  piperacillin-tazobactam (  ZOSYN) IVPB 3.375 g     3.375 g 100 mL/hr over 30 Minutes Intravenous  Once 11/01/13 2304 11/02/13 0051   10/31/13 1800  cefUROXime (ZINACEF) 1.5 g in dextrose 5 % 50 mL IVPB     1.5 g 100 mL/hr over 30 Minutes Intravenous Every 12 hours 10/31/13 1738 11/01/13 0617   10/31/13 1615  cefUROXime (ZINACEF) 1.5 g in dextrose 5 % 50 mL IVPB  Status:  Discontinued     1.5 g 100 mL/hr over 30 Minutes Intravenous To Surgery 10/31/13 1611 10/31/13 1619   10/30/13 1252  cefUROXime (ZINACEF) 1.5 g in dextrose 5 % 50 mL IVPB  Status:  Discontinued     1.5 g 100 mL/hr over 30 Minutes Intravenous 30 min pre-op 10/30/13 1252 10/31/13 1619       V. Leia Alf, M.D. [facility] Office: [phone] Pager:  [phone]

## 2013-11-13 ENCOUNTER — Telehealth: Payer: Self-pay | Admitting: Vascular Surgery

## 2013-11-13 LAB — GLUCOSE, CAPILLARY
GLUCOSE-CAPILLARY: 99 mg/dL (ref 70–99)
GLUCOSE-CAPILLARY: 106 mg/dL — AB (ref 70–99)

## 2013-11-13 LAB — CBC
HCT: 28.7 % — ABNORMAL LOW (ref 39.0–52.0)
Hemoglobin: 9.7 g/dL — ABNORMAL LOW (ref 13.0–17.0)
MCH: 32.6 pg (ref 26.0–34.0)
MCHC: 33.8 g/dL (ref 30.0–36.0)
MCV: 96.3 fL (ref 78.0–100.0)
PLATELETS: 156 10*3/uL (ref 150–400)
RBC: 2.98 MIL/uL — ABNORMAL LOW (ref 4.22–5.81)
RDW: 16.1 % — AB (ref 11.5–15.5)
WBC: 6.3 10*3/uL (ref 4.0–10.5)

## 2013-11-13 MED ORDER — LEVOFLOXACIN 500 MG PO TABS: 500.0000 mg | ORAL_TABLET | Freq: Every day | ORAL | Status: DC

## 2013-11-13 NOTE — Discharge Instructions (Signed)
Abdominal Aortic Aneurysm Open Repair Abdominal aortic aneurysm open repair is a surgery to fix an abdominal aortic aneurysm. An aneurysm is a weakened or damaged part of an artery wall that bulges out as force is applied from blood pumping through the body. An abdominal aortic aneurysm is an aneurysm that occurs in the lower part of the aorta, the main artery of the body.  Surgery is often done if there is an increased risk that the aneurysm might burst (rupture). The risk of rupture increases as the aneurysm becomes larger. A ruptured aneurysm can cause bleeding inside the body that is life threatening. Before that happens, surgery is needed to fix the condition. Surgery may also be done if the aneurysm causes symptoms such as pain in the back, abdomen, or side. In this surgery, a tube-shaped piece of man-made material (aortic graft) is placed in the damaged or weakened part of the aorta to repair the area. LET [facility] CARE PROVIDER KNOW ABOUT:   Any allergies you have.   All medicines you are taking, including vitamins, herbs, eye drops, creams, and over-the-counter medicines.   Use of steroids (by mouth or as creams).   Previous problems you or members of your family have had with the use of anesthetics.  Any blood disorders you have.   Previous surgeries you have had.   Smoking history.   Other health problems you have, including any recent symptoms of colds or infections.   Possibility of pregnancy, if this applies.  RISKS AND COMPLICATIONS  Generally, open repair of an abdominal aortic aneurysm is a safe procedure. However, as with any surgical procedure, complications can occur. Possible complications include:  Bleeding after surgery.   Infection.   Lung problems.   Intestine problems.   Heart attack.   Blood clots.   Very low blood pressure.   Kidney damage.   Nerve damage that can cause pain or numbness in the leg.  BEFORE THE  PROCEDURE  You may need to have blood tests, a test to check heart rhythm (electrocardiography), or a test to check blood flow (angiography) before the surgery.   Imaging tests will be done to check the size and location of the aneurysm. This could include ultrasonography, a CT scan, or an MRI.   Ask your health care provider about changing or stopping your regular medicines.   Do not eat or drink anything for at least 8 hours before the surgery. Ask your health care provider if it is okay to take any needed medicines with a sip of water.   Do not smoke for as long as possible before the surgery. Smoking increases the risk of a healing problem after surgery.   You may be given medicine that causes you to have bowel movements. It is important to empty the intestines before this surgery.   Make plans to have someone drive you home after your hospital stay. Also, arrange for someone to help you with activities during recovery. PROCEDURE  Small monitors will be put on your body. They are used to check your heart, blood pressure, and oxygen level.   You will be given medicine to make you sleep through the procedure (general anesthetic). This medicine will be given through an intravenous (IV) access tube that is put into one of your veins.   A tube will be placed in your bladder to drain urine during and after surgery.   A small tube (epidural catheter) may be inserted into the area of your lower  spine. It is used to give pain medicine during and after surgery.   Once you are asleep, your abdomen will be cleaned using sterile techniques. The surgeon will then make an incision in the abdomen in the area of the aneurysm.   Clamps are put on the abdominal aorta. One is put above the aneurysm. One is put below it. This stops the blood flow.  The aneurysm is then opened. The graft is sewn to healthy parts of the aorta, right above and below the aneurysm. The walls of the opened aneurysm  are wrapped around the graft. It is then sewn closed.   The clamps are removed. This lets the blood flow again.   The incision is closed using stitches or staples.  AFTER THE PROCEDURE   You will stay in a recovery area until the anesthetic has worn off. Your blood pressure and pulse (vital signs) will be checked often.   You will likely go to an intensive care unit before moving to a regular hospital room.   You will be hooked up to machines that check your heart, breathing, and blood pressure.   Your IV access tube and urinary catheter will stay in place for a few days. They will be taken out once your body functions return to normal.   You may be given pain medicine through the epidural catheter for a few days.   You might also have a tube that runs through your nose into the stomach to remove fluids. Once the intestines are working again, the tube in the nose will be removed. This usually happens in 2 3 days.   Various tests may be done to check your condition and the repaired aorta. These might include X-rays, electrocardiography, or ultrasonography.   You may be asked to cough and to do deep breathing exercises frequently. This helps prevent a lung infection.   You may have to wear compression stockings or take blood thinners, or both, while you are in the hospital. These stockings help keep blood clots from forming in your legs.   You will need to stay in the hospital for about a week.  Document Released: 08/05/2009 Document Revised: 04/19/2013 Document Reviewed: 01/17/2013 [facility] Patient Information 2014 [place].  Abdominal Aortic Aneurysm Open Repair, Care After Refer to this sheet in the next few weeks. These instructions provide you with information on caring for yourself after your procedure. Your health care provider may also give you more specific instructions. Your treatment has been planned according to current medical practices, but problems  sometimes occur. Call your health care provider if you have any problems or questions after your procedure.  WHAT TO EXPECT AFTER THE PROCEDURE  After your procedure, it is typical to have the following sensations:  Pain at the incision site. Pain-relieving medicine will be given to control this.  Increased tiredness. It can take up to 3 months before you resume all your normal activities. HOME CARE INSTRUCTIONS   Get plenty of rest, but move around frequently for short periods or take short walks as directed by your health care provider. Gradually increase the distance you walk.   Keep the incision area clean and dry. Remove or change bandages (dressings) only as directed by your health care provider. You may have skin adhesive strips over the incision area. Do not take the strips off. They will fall off on their own.   Take showers once your health care provider approves. Until then, only take sponge baths. Pat incisions  dry. Do not rub incisions with a washcloth or towel. Do not take tub baths or go swimming until your health care provider approves.   Check your incision area every day for increased pain, swelling, redness, or fluid leaking from the incision. These may be signs of an infection.   Only take over-the-counter or prescription medicines as directed by your health care provider.   Limit activities as directed by your health care provider. Avoid strenuous activity and heavy lifting for 6 8 weeks.   Do not drive until your health care provider approves.   Drink enough fluids to keep your urine clear or pale yellow.   Make any lifestyle changes recommended by your health care provider. This may include:   Quitting smoking.   Managing your blood pressure.   Reducing stress.   Eating healthy foods that are good for your heart.   Getting regular exercise.   Follow up with your health care provider as directed.  SEEK MEDICAL CARE IF:   You have  increasing pain.   You have a fever.   You have chills.  You develop redness, swelling, increased pain, or drainage in the incision area.   You notice a bad smell coming from the incision area or dressing.   You notice that the edges of the incision are not staying together after the stitches or staples have been removed.   You have persistent nausea or vomiting.  You develop a rash. SEEK IMMEDIATE MEDICAL CARE IF:   You have dizziness or fainting while standing.   You have difficulty breathing. Document Released: 03/06/2005 Document Revised: 04/19/2013 Document Reviewed: 01/24/2013 [facility] Patient Information 2014 [place].

## 2013-11-13 NOTE — Discharge Summary (Signed)
[facility] Discharge Summary   Patient ID:  Joseph [name] MRN: [id number] DOB/AGE: 71/06/1941 71 y.o.  Admit date: 10/31/2013 Discharge date: 11/13/2013 Date of Surgery: 10/31/2013 - 11/10/2013 Surgeon: Juliann Mule): Serafina Mitchell, MD  Admission Diagnosis: AAA, iliac artery aneurysm abd closure  Discharge Diagnoses:  AAA, iliac artery aneurysm abd closure  Secondary Diagnoses: Past Medical History  Diagnosis Date  . History of colon polyps 2006,2008  . 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 Hyperlipidemia     takes Pravastatin  . Emphysema   . Lung cancer     right upper lobe  . Enlarged prostate     takes Rapaflo daily  . Arthritis     left knee and back arthrtis  . Hypothyroidism     takes Synthroid daily  . Cataract   . Hx of radiation therapy 09/02/11 to 10/19/11    chest  . Diverticulosis   . Cirrhosis     By radiography, seen on CT  . Rosacea   . CAD (coronary artery disease)   . Shortness of breath   . Heart murmur     teen    Procedure(s): ABDOMINAL WOUND CLOSURE   Discharged Condition: good  HPI: Patient is a 73 y.o. year old male who presents for evaluation of abdominal aortic aneurysm. Patient returns today after recent CT scan of the abdomen and pelvis did discuss an intervention The patient denies abdominal pain. The patient denies back pain. The patient has family history of AAA in his brother. Other medical problems include stage 3 lung cancer dormant status post chemo radiation. 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 He denies significant cardiac history. He is also being considered for left knee replacement by Dr. Durward Fortes. Other chronic medical problems include hyperlipidemia, COPD, possible cirrhosis by CT scan. All these are currently stable.  On 10/31/2013 Dr. Oneida Alar  performed Aorto to left and right external iliac bypass, left internal iliac artery bypass, ligation right internal iliac  aneurysm, reimplantation of inferior mesenteric artery. Thrombectomy of left and right iliac and femoral arteries.  He admitted to  The ICU.  He was followed by Raylene Miyamoto, MD for respiratory failure and post-op vent/critical care.  He had acute blood loss anemia and received  6 FFP, 4 PRBC, 2 PLTs in addition to cell savor as well as crystalloid and colloid replacement. He received 2 units of pRBC on 11/01/2013 with a critical HGB of 6.2.  The patient had SBP ranging from 70-110. His HR was in 130's. HE was on NEO at 175.  The am of 11/01/2013 he was alert and intubated. He is writing out questions and concerns.  CCM extubated him at [place].  The patient was re-intubated dur to respiratory insufficiency at 7 pm that same day.  He had developed AFib with RVR around 1500. Plan:  1) intubate, sedate, mechanically ventilat  2) Cardizem, may need amiodarone  3) Neosynephrine now  4) replete K  5) Hold further lasix until BP improves Juanito Doom, MD Critical Care Procedure: Insertion of Central Venous Catheter  Indications: Assessment of intravascular volume and Drug and/or fluid administration  He had a Temp spike 102.4 WBC 14.0 11/01/2013 now WBC 13.2 On Zosyn and Vancomycin sputum cultures sent.  No organisms on sputum culture.  He converted to sinus rhythm.  Lasix was started slowly on 11/03/2013.  He had 4 L of diuresis.   He  was extubated for the second time on 11/04/2013  Evaluation  O2 sats: stable throughout  Complications: No apparent complications  Patient did tolerate procedure well.  Bilateral Breath Sounds: Clear;Diminished  Suctioning: Airway  Yes pt able to vocalize.  Only complains of dry throat. Denies abdominal pain. Now having watery diarrhea.  Rectal tube was placed and stool cultures were sent to r/o C diff.   CULTURES:  Blood 3/04 >>>neg  Sputum 3/04 >>>nml flora  CDiff 3/8>>> neg  ANTIBIOTICS:  zinacef 3/3 >>> 3/04  Vancomycin 3/04 >>>3/8  Zosyn 3/04 >>>3/8 On  11/06/2013  Abdomen soft, still with some diarrhea, no pain  Has some back pain and stiffness chronically  Hypokalemia replete  Continue slow diuresis still up 13-15 liters  D/c NG tube today but keep NPO  Will consider clear liquids tomorrow if tolerates NG out  Follow up c diff  Will use PCA rather than Fentanyl patch for pain control  Needs to start walking  Consider transfer to 2W tomorrow if does well today.  A fib on Amiodarone will recheck 12 lead EKG today appears sinus on monitor. RENAL  A:  Hypokalemia - worse in setting diarrhea and aggressive diuresis  P:  Aggressively replace K with Afib RVR  Monitor renal fx, urine outpt, electrolytes INFECTIOUS  A:  Fever 3/04.  P:  Day 5 vancomycin, zosyn - cultures neg, afebrile - d/c abx  Transfer to 2W and started clear liquid diet.  D/C'd PCA on 11/08/2013 and foley was removed.  11/09/2013 discharged fecal tube and advanced to a regular diet.  On 11/09/2013 Called to see patient because of drainage from the lower part of the incision. Patient has some thin bloody drainage from the lower part of the incision. I removed one staple and probed the wound with a Q-tip. The fascia appears to be intact and I suspect this is just a liquefied subcutaneous hematoma. This was packed with gauze and we will do dressing changes as needed. At this point I do not think that he has had a fascial dehiscence. The patient was seen by Dr. Gae Gallop.  On 11/10/2013 we were called to the floor the patient had 3 more episodes of SS drainage that did not respond to conservative management.    He was taken to the OR by Dr. Trula Slade for repair of abdominal wall dehiscence.  He was watched over the weekend.  Today 11/13/2013: Assessment/Planning:  POD #3  Repair of abdominal wall dehiscence  POD #11  Aorto to left and right external iliac bypass, left internal iliac artery bypass, ligation right internal iliac aneurysm, reimplantation of inferior mesenteric artery.  Thrombectomy of left and right iliac and femoral arteries  Tolerating PO's well, ambulating, and voiding.  D/C home on 4 L of O2 via [place] Levaquin for 7 days PO for erythema of incision.     Hospital Course:  Joseph [name] is a 73 y.o. male is S/P Neither Procedure(s): ABDOMINAL WOUND CLOSURE and Aorto to left and right external iliac bypass, left internal iliac artery bypass, ligation right internal iliac aneurysm, reimplantation of inferior mesenteric artery. Thrombectomy of left and right iliac and femoral arteries  Extubated: POD # 2 and then re-intubated until 11/04/2013 Physical exam: Feet are warm and well perfused.  Abdomin is soft without drainage. Pos BS Incisional erythema at repair site post-op dehiscence  Post-op wounds erythematous or healing well Pt. Ambulating, voiding and taking PO diet without difficulty. Pt pain controlled with PO pain meds.  Labs as below Complications:See HPI for hospital course  Consults:     Significant Diagnostic Studies: CBC Lab Results  Component Value Date   WBC 6.3 11/13/2013   HGB 9.7* 11/13/2013   HCT 28.7* 11/13/2013   MCV 96.3 11/13/2013   PLT 156 11/13/2013    BMET    Component Value Date/Time   NA 131* 11/12/2013 0425   NA 141 07/17/2013 0856   K 3.6* 11/12/2013 0425   K 4.2 07/17/2013 0856   CL 95* 11/12/2013 0425   CL 109* 08/01/2012 1046   CO2 24 11/12/2013 0425   CO2 23 07/17/2013 0856   GLUCOSE 100* 11/12/2013 0425   GLUCOSE 94 07/17/2013 0856   GLUCOSE 93 08/01/2012 1046   BUN 14 11/12/2013 0425   BUN 14.7 07/17/2013 0856   CREATININE 0.91 11/12/2013 0425   CREATININE 1.00 08/17/2013 1250   CREATININE 0.9 07/17/2013 0856   CALCIUM 7.8* 11/12/2013 0425   CALCIUM 8.9 07/17/2013 0856   GFRNONAA 84* 11/12/2013 0425   GFRAA >90 11/12/2013 0425   COAG Lab Results  Component Value Date   INR 1.29 11/01/2013   INR 1.37 11/01/2013   INR 1.32 10/31/2013     Disposition:  Discharge to :Home Discharge Orders   Future  Appointments Provider Department Dept Phone   11/22/2013 11:30 AM [facility] [phone]   01/01/2014 11:00 AM [facility] Lab 4 [facility] [phone]   01/01/2014 12:00 PM [facility]-Ct 2 [facility] [phone]   Liquids only 4 hours prior to your exam. Any medications can be taken as usual. Please arrive 15 min prior to your scheduled exam time.   01/03/2014 11:15 AM Curt Bears, MD [facility] [phone]   01/03/2014 11:45 AM [facility] [phone]   01/16/2014 4:00 PM Dorothy Spark, MD [facility] [phone]   Future Orders Complete By Expires   ABDOMINAL PROCEDURE/ANEURYSM REPAIR/AORTO-BIFEMORAL BYPASS:  Call MD for increased abdominal pain; cramping diarrhea; nausea/vomiting  As directed    Call MD for:  redness, tenderness, or signs of infection (pain, swelling, bleeding, redness, odor or green/yellow discharge around incision site)  As directed    Call MD for:  severe or increased pain, loss or decreased feeling  in affected limb(s)  As directed    Call MD for:  temperature >100.5  As directed    Discharge patient  As directed    Comments:     Discharge pt to home   Discharge wound care:  As directed    Comments:     Shower daily with soap and water starting 11/12/13   Driving Restrictions  As directed    Comments:     No driving for 2 weeks   Lifting restrictions  As directed    Comments:     No lifting for 6 weeks   Resume previous diet  As directed        Medication List         atorvastatin 10 MG tablet  Commonly known as:  LIPITOR  Take 1 tablet (10 mg total) by mouth daily.     doxycycline 100 MG capsule  Commonly known as:  VIBRAMYCIN  Take 100 mg by mouth 2 (two) times daily.     FINACEA 15 % cream  Generic drug:  Azelaic Acid  Apply 1  application topically as directed.  furosemide 20 MG tablet  Commonly known as:  LASIX  Take 20 mg by mouth daily. Not started yet     HYDROcodone-acetaminophen 5-325 MG per tablet  Commonly known as:  NORCO/VICODIN  Take 1 tablet by mouth every 6 (six) hours as needed.     levofloxacin 500 MG tablet  Commonly known as:  LEVAQUIN  Take 1 tablet (500 mg total) by mouth daily.     lidocaine-prilocaine cream  Commonly known as:  EMLA  Apply 1 application topically as needed.     metroNIDAZOLE 0.75 % gel  Commonly known as:  METROGEL  Apply 1 application topically 2 (two) times daily.     MILK THISTLE PO  Take 1 capsule by mouth daily. Take one 1200 mg capsule daily     multivitamins ther. w/minerals Tabs tablet  Take 1 tablet by mouth daily.     nystatin-triamcinolone cream  Commonly known as:  MYCOLOG II  Apply 1 application topically daily as needed. Rash     OVER THE COUNTER MEDICATION  30 mg 3 (three) times daily. LUNG SUPPORT     OVER THE COUNTER MEDICATION  [place]  TAIL MUSHROOM  Takes 1 gr daily     SYNTHROID 175 MCG tablet  Generic drug:  levothyroxine  Take 175 mcg by mouth daily.     tamsulosin 0.4 MG Caps capsule  Commonly known as:  FLOMAX  Take 0.4 mg by mouth daily.     tiotropium 18 MCG inhalation capsule  Commonly known as:  SPIRIVA  Place 18 mcg into inhaler and inhale daily.       Verbal and written Discharge instructions given to the patient. Wound care per Discharge AVS     Follow-up Information   Follow up with Elam Dutch, MD In 2 weeks. (sen to office)    Specialty:  Vascular Surgery   Contact information:   [address] [place] [address] [phone]       Signed: Laurence Slate [facility] 11/13/2013, 1:51 PM  - For [facility] Registry use --- Instructions: Press F2 to tab through selections.  Delete question if not applicable.   Post-op:  Time to Extubation: [ ]  In OR, [ ]  < 12 hrs, [ ]  12-24 hrs, [ x] >=24  hrs Vasopressors Req. Post-op: Yes ICU Stay: 7 days Transfusion: Yes  If yes, 9 prbc units given platelets 4 FFP 12  MI: [x ] No, [ ]  Troponin only, [ ]  EKG or Clinical New Arrhythmia: Yes  Complications: CHF: Yes Resp failure: [ ]  none, [ ]  Pneumonia, [x ] Ventilator Chg in renal function: [x ] none, [ ]  Inc. Cr > 0.5, [ ]  Temp. Dialysis, [ ]  Permanent dialysis Leg ischemia: [x ] No, [ ]  Yes, no Surgery needed, [ ]  Yes, Surgery needed, [ ]  Amputation Bowel ischemia: [x ] No, [ ]  Medical Rx, [ ]  Surgical Rx Wound complication: [ ]  No, [x ] Superficial separation/infection, [ ]  Return to OR Return to OR: Yes  Return to OR for bleeding: Yes Stroke: [ x] None, [ ]  Minor, [ ]  Major  Discharge medications: Statin use:  Yes ASA use:  No  for medical reason   Plavix use:  No  for medical reason   Beta blocker use: No  for medical reason

## 2013-11-13 NOTE — Progress Notes (Signed)
Occupational Therapy Treatment Patient Details Name: OTHON GUARDIA MRN: [id number] DOB: 1941/03/06 Today's Date: 11/13/2013 Time: 5643-3295 OT Time Calculation (min): 23 min  OT Assessment / Plan / Recommendation  History of present illness pt adm for elective surgical management of AAA.  Pt s/p  Aorto to left and right external iliac bypass, left internal iliac artery bypass, ligation right internal iliac aneurysm, reimplantation of inferior mesenteric artery. Thrombectomy of left and right iliac and femoral arteries.  Back 3/13 for repair of dehiscence.   OT comments  Pt. And family ed. On every conservation strategies for ADLs. Pt. And family able to verbalize back. Pt. Ed. On cross leg technique for LE dressing. Pt. Has all DME equipment needed at home. Pt. And family ed. On safety with use of O2 and to make sure the tubing does not trip pt. Pt. Is set to d/c today and pt. And family state they are prepared and feel safe to go.   Follow Up Recommendations       Barriers to Discharge       Equipment Recommendations       Recommendations for Other Services    Frequency     Progress towards OT Goals Progress towards OT goals: Progressing toward goals  Plan      Precautions / Restrictions Precautions Precautions: Fall Restrictions Weight Bearing Restrictions: No   Pertinent Vitals/Pain No c/o    ADL  Lower Body Dressing: Minimal assistance Where Assessed - Lower Body Dressing: Unsupported sitting;Supported standing ADL Comments: Pt is able to cross his foot over opposite knee to donn and doff socks. Pt has foley and rectocele.     OT Diagnosis:    OT Problem List:   OT Treatment Interventions:     OT Goals(current goals can now be found in the care plan section) Acute Rehab OT Goals Patient Stated Goal: I want to get home  Visit Information  Last OT Received On: 11/13/13 Assistance Needed: +1 History of Present Illness: pt adm for elective surgical management  of AAA.  Pt s/p  Aorto to left and right external iliac bypass, left internal iliac artery bypass, ligation right internal iliac aneurysm, reimplantation of inferior mesenteric artery. Thrombectomy of left and right iliac and femoral arteries.  Back 3/13 for repair of dehiscence.    Subjective Data      Prior Functioning       Cognition  Cognition Arousal/Alertness: Awake/alert Behavior During Therapy: WFL for tasks assessed/performed Overall Cognitive Status: Within Functional Limits for tasks assessed    Mobility       Exercises      Balance    End of Session OT - End of Session Activity Tolerance: Patient limited by fatigue Patient left: in bed;with call bell/phone within reach;with family/visitor present Nurse Communication:  (Pt. d/c home with family today.)  GO     Railey Glad 11/13/2013, 1:12 PM

## 2013-11-13 NOTE — Progress Notes (Addendum)
[facility] [place]  Subjective  - I'm doing well.  My swelling is down in my feet.  He tolerated a regular PO diet and is voiding.     Objective 126/71 90 98.5 F (36.9 C) (Oral) 18 95%  Intake/Output Summary (Last 24 hours) at 11/13/13 0725 Last data filed at 11/13/13 0600  Gross per 24 hour  Intake    720 ml  Output   3525 ml  Net  -2805 ml    Feet are warm and well perfused. Abdomin is soft without drainage.  Pos BS   Assessment/Planning: POD #3 Repair of abdominal wall dehiscence POD #11 Aorto to left and right external iliac bypass, left internal iliac artery bypass, ligation right internal iliac aneurysm, reimplantation of inferior mesenteric artery. Thrombectomy of left and right iliac and femoral arteries Tolerating PO's well, ambulating, and voiding. Plan D/C home after Dr. Oneida Alar sees the patient D/C home on 4 L of O2 via [place], [place] [facility] 11/13/2013 7:25 AM -- Will d/c home today.  Continue full 7 day course of Levaquin.  Ruta Hinds, MD [facility] [place] Office: [phone] Pager: [phone]  Laboratory Lab Results:  Recent Labs  11/12/13 0425 11/13/13 0402  WBC 7.3 6.3  HGB 10.1* 9.7*  HCT 30.2* 28.7*  PLT 148* 156   BMET  Recent Labs  11/12/13 0425  NA 131*  K 3.6*  CL 95*  CO2 24  GLUCOSE 100*  BUN 14  CREATININE 0.91  CALCIUM 7.8*    COAG Lab Results  Component Value Date   INR 1.29 11/01/2013   INR 1.37 11/01/2013   INR 1.32 10/31/2013   No results found for this basename: PTT

## 2013-11-13 NOTE — Telephone Encounter (Addendum)
Message copied by Gena Fray on Mon Nov 13, 2013  2:42 PM ------      Message from: Denman George      Created: Mon Nov 13, 2013 11:14 AM      Regarding: Zigmund Daniel log                   ----- Message -----         From: Ulyses Amor, PA-C         Sent: 11/13/2013   8:19 AM           To: Vvs Charge Pool            F/U with Dr. Oneida Alar 2 weeks s/p AAA open repair with I and D of abdomin 11/10/2013.  Has abdominal staples going home on Levaquin for 7 days. ------  11/13/13: lvm for pt with appt information,dpm

## 2013-11-13 NOTE — Progress Notes (Signed)
SATURATION QUALIFICATIONS: (This note is used to comply with regulatory documentation for home oxygen)  Patient Saturations on Room Air at Rest = 89%  Patient Saturations on Room Air while Ambulating = 84%  Patient Saturations on 6 Liters of oxygen while Ambulating = 95%  Please briefly explain why patient needs home oxygen:

## 2013-11-14 ENCOUNTER — Encounter (HOSPITAL_COMMUNITY): Payer: Self-pay | Admitting: Surgery

## 2013-11-15 ENCOUNTER — Telehealth: Payer: Self-pay | Admitting: Cardiology

## 2013-11-15 ENCOUNTER — Telehealth: Payer: Self-pay

## 2013-11-15 NOTE — Telephone Encounter (Signed)
Phone call from [place].  Questioned if pt. should take "Furosemide 20 mg daily" that was on discharge medication list, but noted that "not started yet."  Advised HH RN to contact Dr. Ena Dawley, cardiologist, for further recommendations on Furosemide.  Per [facility] RN, Dr. Meda Coffee stated she was not consulted when pt. was in the hospital, and could not make recommendation re: Furosemide at this time.  Discussed with Dr. Oneida Alar.  Medical record reviewed per Dr. Oneida Alar; pt. was not on Furosemide when he got admitted to hospital on 10/31/13.  Dr. Oneida Alar stated pt. Does not need to be on Furosemide at this time.  Notified Tammy, [place] RN, [facility].  Advised of Dr. Oneida Alar recommendation to advise pt. He does not need to be on Furosemide at this time.  Advised HH RN, if pt. develops symptoms of increased edema, SOB, or cough, he should contact his PCP or cardiologist for evaluation.  Verb. Understanding.

## 2013-11-15 NOTE — Telephone Encounter (Signed)
Joseph [name] with [name] is advised that we were unaware that the pts Lasix was stopped and advised her to contact the pts surgeon for clarification. She verbalized understanding.

## 2013-11-15 NOTE — Telephone Encounter (Signed)
New message     Need clarification on his furosemide post hosp discharge

## 2013-11-16 ENCOUNTER — Encounter: Payer: Self-pay | Admitting: Pharmacist

## 2013-11-16 ENCOUNTER — Encounter: Payer: Self-pay | Admitting: Vascular Surgery

## 2013-11-16 ENCOUNTER — Telehealth: Payer: Self-pay | Admitting: Pharmacist

## 2013-11-16 NOTE — Telephone Encounter (Signed)
Patient just recently had surgery to repair his AAA this month.  He stopped his atorvastatin on his own as he wasn't going to be able to get blood work as scheduled, and he would rather wait until he can get blood work before he starts taking it daily again.  He wasn't having any trouble on it when he was taking it.  He hopes to restart atorvastatin in 2-3 weeks once he starts feeling better from the surgery, then he will call me back to set up labs accordingly.

## 2013-11-17 ENCOUNTER — Ambulatory Visit (INDEPENDENT_AMBULATORY_CARE_PROVIDER_SITE_OTHER): Payer: Medicare Other | Admitting: Vascular Surgery

## 2013-11-17 ENCOUNTER — Telehealth: Payer: Self-pay | Admitting: *Deleted

## 2013-11-17 ENCOUNTER — Encounter: Payer: Self-pay | Admitting: Vascular Surgery

## 2013-11-17 VITALS — BP 111/71 | HR 106 | Resp 18 | Ht 72.0 in | Wt 223.0 lb

## 2013-11-17 DIAGNOSIS — I714 Abdominal aortic aneurysm, without rupture, unspecified: Secondary | ICD-10-CM

## 2013-11-17 NOTE — Telephone Encounter (Signed)
Tammy (RN from [facility]) calling to report that on her home visit this afternoon, she observed that Joseph [name]'s abdominal incision site was red, warm, tender to touch and that she "observed drainage at staple site near umbilicus."  She states [name] is afebrile and is taking Levaquin as directed.  Reviewed [name]'s symptoms and surgical history with Dr. Bridgett Larsson who advised that [name] come into the office to be assessed by him this afternoon.  Called Tammy (RN from [place]) who will call [name] and advise him of the work-in appointment to see Dr. Bridgett Larsson this afternoon.

## 2013-11-17 NOTE — Progress Notes (Signed)
    Post-operative Open AAA Repair  History of Present Illness  Joseph [name] is a 73 y.o. male who presents post-op s/p Aortobi-iliac repair with common iliac aneurysm ligation, IMA reimplantation, L IIA reimplantation by Dr Eden Lathe (Date: 10/31/13).  This was complication by a fascial dehiscence requiring exploration and closure by Dr. Trula Slade on 11/11/13.  Since d/c on Levaquin, the patient has developed increased rash and drainage by report.  For VQI Use Only  PRE-ADM LIVING: Home  AMB STATUS: Ambulatory  Physical Examination  Filed Vitals:   11/17/13 1638  BP: 111/71  Pulse: 106  Resp: 18  Height: 6' (1.829 m)  Weight: 223 lb (101.152 kg)    Gastrointestinal: soft, NTND, -G/R, - HSM, - masses, - CVAT B, incision intact, some serous drainage from staple hole, erythema around staple, no fluctuance, no tenderness to light touch  Medical Decision Making  Joseph [name] is a 72 y.o. male who presents s/p Ao-Bi-Il, IMA reimplantation, L IIA reimplantation, xlap with fascial closure presents with likely staple reaction and some serous drainage   There is nothing on exam which would make me suspect abscess at this time.  I recommended he finish his antibiotics and proceed with local wound care of the incision including: Washing off the incision twice a day and applying sterile bandages as needed.  The patient has scheduled follow up with Dr. Oneida Alar next week.  Adele Barthel, MD [facility] [place] Office: [phone] Pager: [phone]  11/17/2013, 5:16 PM

## 2013-11-22 ENCOUNTER — Ambulatory Visit (HOSPITAL_BASED_OUTPATIENT_CLINIC_OR_DEPARTMENT_OTHER): Payer: Medicare Other

## 2013-11-22 VITALS — BP 123/76 | HR 94 | Temp 97.9°F

## 2013-11-22 DIAGNOSIS — C3410 Malignant neoplasm of upper lobe, unspecified bronchus or lung: Secondary | ICD-10-CM

## 2013-11-22 DIAGNOSIS — Z95828 Presence of other vascular implants and grafts: Secondary | ICD-10-CM

## 2013-11-22 DIAGNOSIS — Z452 Encounter for adjustment and management of vascular access device: Secondary | ICD-10-CM

## 2013-11-22 MED ORDER — SODIUM CHLORIDE 0.9 % IJ SOLN
10.0000 mL | INTRAMUSCULAR | Status: DC | PRN
  Administered 2013-11-22: 10 mL via INTRAVENOUS
  Filled 2013-11-22: qty 10

## 2013-11-22 MED ORDER — HEPARIN SOD (PORK) LOCK FLUSH 100 UNIT/ML IV SOLN
500.0000 [IU] | Freq: Once | INTRAVENOUS | Status: AC
  Administered 2013-11-22: 500 [IU] via INTRAVENOUS
  Filled 2013-11-22: qty 5

## 2013-11-22 NOTE — Patient Instructions (Signed)

## 2013-11-23 ENCOUNTER — Encounter: Payer: Self-pay | Admitting: Vascular Surgery

## 2013-11-23 NOTE — Telephone Encounter (Signed)
I called the patient to get additional information. He has been having nausea off and on for the past week; no active vomiting. He is afebrile, and is not having any problems with BMs or urination. His small incisional wound is healing with no drainage. Patient states that he is having some pain from the stapled incision but he has an appt with Dr. Oneida Alar on 11-30-13 for their removal. He states that he has not had much appetite since his surgery and I explained the reasons for this; he voiced understanding. He is eating when he takes his medications but he has started taking multivitamins with iron recently and thinks this may be contributing to his nausea. I instructed him to stop the MVT for a couple of days or take it at night, and try eating small high protein meals several times a day. I told him not to take the Compazine unless he is actively vomiting and he agrees with this plan. He will call back if he has any further issues.

## 2013-11-29 ENCOUNTER — Encounter: Payer: Self-pay | Admitting: Vascular Surgery

## 2013-11-30 ENCOUNTER — Encounter: Payer: Self-pay | Admitting: Vascular Surgery

## 2013-11-30 ENCOUNTER — Ambulatory Visit (INDEPENDENT_AMBULATORY_CARE_PROVIDER_SITE_OTHER): Payer: Medicare Other | Admitting: Vascular Surgery

## 2013-11-30 VITALS — BP 121/83 | HR 113 | Temp 97.8°F | Ht 72.0 in | Wt 209.0 lb

## 2013-11-30 DIAGNOSIS — I714 Abdominal aortic aneurysm, without rupture, unspecified: Secondary | ICD-10-CM

## 2013-11-30 NOTE — Progress Notes (Signed)
Patient is a 73 year old male who returns for followup today after abdominal aortic aneurysm repair. He had aorto to right external iliac and left external iliac with a bypass off the left limb to the left internal iliac, ligation of the right internal iliac aneurysm and reimplantation of his inferior mesenteric artery. He reports he has had some intermittent constipation symptoms from his pain medication. He is having bowel movements. He has no nausea or vomiting. His activity level is slowly increasing. He occasionally has some minor pain in his left. He also has a patch of numbness on the left lateral aspect of his thigh.  Physical exam:  Filed Vitals:   11/30/13 0839  BP: 121/83  Pulse: 113  Temp: 97.8 F (36.6 C)  TempSrc: Oral  Height: 6' (1.829 m)  Weight: 209 lb (94.802 kg)  SpO2: 98%    Abdomen: Incision healing well Staples removed today no evidence of hernia  Extremities: 2+ femoral pulses bilaterally  Assessment: Continuing to slowly improve and recover from recent abdominal aortic aneurysm repair.    Plan: Followup 6 months.  He'll return sooner if any of the above problems worsen.  Ruta Hinds, MD [facility] [place] Office: [phone] Pager: [phone]

## 2013-12-11 ENCOUNTER — Telehealth: Payer: Self-pay

## 2013-12-11 NOTE — Telephone Encounter (Signed)
Rec'd phone call from [place] regarding "hard knot on incision" on 12/08/13, @ 4:10 PM.  Phone call to pt. at 12:00 PM, today, to discuss symptoms.  Stated he has an area on midline abdominal incision , "approx. 4-5 " down from breastbone, that has a hard knot where a staple had been."  Denied any redness, drainage, or fever/ chills.  Reported that the knot had been "sensitive" when touching it, and it was "tender."  Reported that the tenderness has improved today.  Advised will schedule appt. for evaluation this week;  appt. given for 2:00 PM, 4/16, with the nurse practitioner.  Pt. also reported has had nausea, and abdominal bloating.  Reported he usually has very regular BM's.  Stated that he is using Hydrocodone on occasion.  Reported has been taking Citracel, and had a good BM today.  Reports is able to pass flatus without problems.  Advised to increase water intake and mobility, to help with peristalsis.  Advised to report if symptoms worsen with bowels, ie:  increased abd. pain, bloating, or nausea w/ vomiting.  Verb. Understanding.

## 2013-12-13 ENCOUNTER — Encounter: Payer: Self-pay | Admitting: Family

## 2013-12-14 ENCOUNTER — Telehealth: Payer: Self-pay | Admitting: Surgery

## 2013-12-14 ENCOUNTER — Ambulatory Visit (INDEPENDENT_AMBULATORY_CARE_PROVIDER_SITE_OTHER): Payer: Medicare Other | Admitting: Family

## 2013-12-14 ENCOUNTER — Encounter: Payer: Self-pay | Admitting: Family

## 2013-12-14 ENCOUNTER — Other Ambulatory Visit: Payer: Self-pay | Admitting: Vascular Surgery

## 2013-12-14 VITALS — BP 112/80 | HR 106 | Temp 97.5°F | Resp 18 | Ht 72.0 in | Wt 206.0 lb

## 2013-12-14 DIAGNOSIS — Z5189 Encounter for other specified aftercare: Secondary | ICD-10-CM

## 2013-12-14 DIAGNOSIS — R209 Unspecified disturbances of skin sensation: Secondary | ICD-10-CM

## 2013-12-14 DIAGNOSIS — R200 Anesthesia of skin: Secondary | ICD-10-CM | POA: Insufficient documentation

## 2013-12-14 DIAGNOSIS — N50819 Testicular pain, unspecified: Secondary | ICD-10-CM

## 2013-12-14 DIAGNOSIS — N509 Disorder of male genital organs, unspecified: Secondary | ICD-10-CM

## 2013-12-14 NOTE — Progress Notes (Signed)
[facility] [place]  Established Abdominal Aortic Aneurysm  History of Present Illness  Joseph [name] is a 73 y.o. (10-13-1940) male patient of Dr. Oneida Alar who is s/p abdominal aortic aneurysm repair. 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  He returns today for c/o hard knot on upper abdominal incision where a staple had been which he states has been improving in the last few days in terms of getting smaller and less tender.  The patient is not a smoker. The patient denies claudication in legs with walking. He is getting home physical therapy and home health nursing evaluation of VS, bowel function. He is moving his bowels, eating OK. Also c/o testicle pain since the above vascular intervention, right worse than left.  Pt Diabetic: No Pt smoker: former smoker, quit in 2013  Past Medical History  Diagnosis Date  . History of colon polyps 2006,2008  . 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 Hyperlipidemia     takes Pravastatin  . Emphysema   . Lung cancer     right upper lobe  . Enlarged prostate     takes Rapaflo daily  . Arthritis     left knee and back arthrtis  . Hypothyroidism     takes Synthroid daily  . Cataract   . Hx of radiation therapy 09/02/11 to 10/19/11    chest  . Diverticulosis   . Cirrhosis     By radiography, seen on CT  . Rosacea   . CAD (coronary artery disease)   . Shortness of breath   . Heart murmur     teen   Past Surgical History  Procedure Laterality Date  . Hernia repair  70's  . Vasectomy  70's  . Finger surgery  2008    left pointer finger  . Knee arthroscopy  2008    left knee  . Tonsillectomy      as a child  . Colonoscopy w/ biopsies      multiple   . Cardiac catheterization   2003  . Esophagogastroduodenoscopy  2006  . Portacath placement  08/10/2011    Procedure: INSERTION PORT-A-CATH;  Surgeon: Pierre Bali, MD;  Location: [facility];  Service: Thoracic;  Laterality: Left;  Left Subclavian  . Abdominal aortic aneurysm repair N/A 10/31/2013    Procedure: aorto to left external iliac and right external iliac and left internal iliac, reimplantation of inferior mesenteric artery and ligation of left iliac artery aneursym;  Surgeon: Elam Dutch, MD;  Location: [facility];  Service: Vascular;  Laterality: N/A;  . Thrombectomy iliac artery Bilateral 10/31/2013    Procedure: THROMBECTOMY ILIAC ARTERY;  Surgeon: Elam Dutch, MD;  Location: [facility];  Service: Vascular;  Laterality: Bilateral;  . Abdominal wound dehiscence N/A 11/10/2013    Procedure: ABDOMINAL WOUND CLOSURE ;  Surgeon: Serafina Mitchell, MD;  Location: [facility] OR;  Service: Vascular;  Laterality: N/A;   Social History History   Social History  . Marital Status: Married    Spouse Name: N/A    Number of Children: N/A  . Years of Education: N/A   Occupational History  . Not on file.   Social History Main Topics  . Smoking status: Former Smoker -- 1.50 packs/day  for 40 years    Types: Cigarettes    Quit date: 07/02/2011  . Smokeless tobacco: Former Systems developer  . Alcohol Use: 0.0 oz/week    0-2 Cans of beer per week     Comment: social  . Drug Use: No  . Sexual Activity: Not on file   Other Topics Concern  . Not on file   Social History Narrative  . No narrative on file   Family History Family History  Problem Relation Age of Onset  . Anesthesia problems Neg Hx   . Hypotension Neg Hx   . Malignant hyperthermia Neg Hx   . Pseudochol deficiency Neg Hx   . Cancer Father   . Diabetes Father   . Hyperlipidemia Brother   . Aneurysm Brother     Abdominal aortic aneurysm  . Hyperlipidemia Mother   . Hyperlipidemia Sister     Current Outpatient Prescriptions on File Prior to Visit  Medication Sig  Dispense Refill  . atorvastatin (LIPITOR) 10 MG tablet Take 1 tablet (10 mg total) by mouth daily.  30 tablet  5  . doxycycline (VIBRAMYCIN) 100 MG capsule Take 100 mg by mouth 2 (two) times daily.      Marland Kitchen FINACEA 15 % cream Apply 1 application topically as directed.      . furosemide (LASIX) 20 MG tablet Take 20 mg by mouth daily. Not started yet      . HYDROcodone-acetaminophen (NORCO/VICODIN) 5-325 MG per tablet Take 1 tablet by mouth every 6 (six) hours as needed.  30 tablet  0  . levofloxacin (LEVAQUIN) 500 MG tablet Take 1 tablet (500 mg total) by mouth daily.  7 tablet  0  . lidocaine-prilocaine (EMLA) cream Apply 1 application topically as needed.        . metroNIDAZOLE (METROGEL) 0.75 % gel Apply 1 application topically 2 (two) times daily.      Marland Kitchen MILK THISTLE PO Take 1 capsule by mouth daily. Take one 1200 mg capsule daily      . Multiple Vitamins-Minerals (MULTIVITAMINS THER. W/MINERALS) TABS Take 1 tablet by mouth daily.        Marland Kitchen nystatin-triamcinolone (MYCOLOG II) cream Apply 1 application topically daily as needed. Rash      . OVER THE COUNTER MEDICATION 30 mg 3 (three) times daily. LUNG SUPPORT      . OVER THE COUNTER MEDICATION [place]  TAIL MUSHROOM  Takes 1 gr daily      . SYNTHROID 175 MCG tablet Take 175 mcg by mouth daily.       . tamsulosin (FLOMAX) 0.4 MG CAPS Take 0.4 mg by mouth daily.      Marland Kitchen tiotropium (SPIRIVA) 18 MCG inhalation capsule Place 18 mcg into inhaler and inhale daily.       No current facility-administered medications on file prior to visit.   No Known Allergies  ROS: See HPI for pertinent positives and negatives.  Physical Examination  Filed Vitals:   12/14/13 1402  BP: 112/80  Pulse: 106  Temp: 97.5 F (36.4 C)  TempSrc: Oral  Resp: 18  Height: 6' (1.829 m)  Weight: 206 lb (93.441 kg)  SpO2: 97%   Body mass index is 27.93 kg/(m^2).  General: A&O x 3, WD, wife present.  Pulmonary: Respirations are non labored.  Cardiac:  RRR   Carotid Bruits Left Right   Negative Negative   Aorta is not palpable Radial pulses are 2+ palpable and =.  VASCULAR EXAM:                                                                                                         LE Pulses LEFT RIGHT       FEMORAL   palpable   palpable        POPLITEAL  not palpable   not palpable       POSTERIOR TIBIAL   palpable    palpable        DORSALIS PEDIS      ANTERIOR TIBIAL  palpable   palpable      Gastrointestinal: soft, NTND, -G/R, - HSM, - masses, - CVAT B. Abdominal incision is well healed. 2 mm raised mildly tender area at upper incision, likely a dissolvable suture, no redness, no drainage.  Musculoskeletal: M/S 5/5 throughout, Extremities without ischemic changes.  Neurologic: CN 2-12 intact, Pain and light touch intact in extremities are intact except, Motor exam as listed above.  Medical Decision Making  The patient is a 73 y.o. male who is s/p abdominal aortic aneurysm repair. 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  He returns today for c/o hard knot on upper abdominal incision where a staple had been which he states has been improving in the last few days in terms of getting smaller and less tender; this is likely an absorbable suture which should absorb over the next few weeks. c/o testicle pain since the above vascular intervention, right worse than left.   Dr. Oneida Alar spoke with patient and examined him. Will request testicular US for c/o testicular pain post op, no swelling.  I emphasized the importance of maximal medical management including strict control of blood pressure, blood glucose, and lipid levels, antiplatelet agents, obtaining regular  exercise, and continued cessation of smoking.   The patient was given information about AAA including signs, symptoms, treatment, and how to minimize the risk of enlargement and rupture of aneurysms.    The patient was advised to call 911 should the patient experience sudden onset abdominal or back pain.   Thank you for allowing [place] to participate in this patient's care.  Clemon Chambers, RN, MSN, FNP-C [facility] [place] Office: [phone]  Clinic Physician: Oneida Alar  12/14/2013, 2:14 PM

## 2013-12-14 NOTE — Telephone Encounter (Signed)
Spoke with pt. Gave him the following information: Scrotal Ultrasound will be done at [facility] 12/18/13 at 1:15 pm.

## 2013-12-18 ENCOUNTER — Ambulatory Visit
Admission: RE | Admit: 2013-12-18 | Discharge: 2013-12-18 | Disposition: A | Discharge status: Home / Self Care | Payer: Medicare Other | Source: Ambulatory Visit | Attending: Vascular Surgery | Admitting: Vascular Surgery

## 2013-12-18 DIAGNOSIS — N50819 Testicular pain, unspecified: Secondary | ICD-10-CM

## 2014-01-01 ENCOUNTER — Ambulatory Visit (HOSPITAL_COMMUNITY)
Admission: RE | Admit: 2014-01-01 | Discharge: 2014-01-01 | Disposition: A | Discharge status: Home / Self Care | Payer: Medicare Other | Source: Ambulatory Visit | Attending: Internal Medicine | Admitting: Internal Medicine

## 2014-01-01 ENCOUNTER — Encounter (HOSPITAL_COMMUNITY): Payer: Self-pay

## 2014-01-01 ENCOUNTER — Other Ambulatory Visit (HOSPITAL_BASED_OUTPATIENT_CLINIC_OR_DEPARTMENT_OTHER): Payer: Medicare Other

## 2014-01-01 DIAGNOSIS — J438 Other emphysema: Secondary | ICD-10-CM | POA: Insufficient documentation

## 2014-01-01 DIAGNOSIS — C3410 Malignant neoplasm of upper lobe, unspecified bronchus or lung: Secondary | ICD-10-CM | POA: Insufficient documentation

## 2014-01-01 LAB — CBC WITH DIFFERENTIAL/PLATELET
BASO%: 0.5 % (ref 0.0–2.0)
BASOS ABS: 0 10*3/uL (ref 0.0–0.1)
EOS%: 2.4 % (ref 0.0–7.0)
Eosinophils Absolute: 0.1 10*3/uL (ref 0.0–0.5)
HCT: 40.6 % (ref 38.4–49.9)
HGB: 13.7 g/dL (ref 13.0–17.1)
LYMPH%: 26.8 % (ref 14.0–49.0)
MCH: 32.8 pg (ref 27.2–33.4)
MCHC: 33.7 g/dL (ref 32.0–36.0)
MCV: 97.3 fL (ref 79.3–98.0)
MONO#: 0.6 10*3/uL (ref 0.1–0.9)
MONO%: 10.5 % (ref 0.0–14.0)
NEUT#: 3.3 10*3/uL (ref 1.5–6.5)
NEUT%: 59.8 % (ref 39.0–75.0)
Platelets: 197 10*3/uL (ref 140–400)
RBC: 4.17 10*6/uL — AB (ref 4.20–5.82)
RDW: 14.6 % (ref 11.0–14.6)
WBC: 5.6 10*3/uL (ref 4.0–10.3)
lymph#: 1.5 10*3/uL (ref 0.9–3.3)

## 2014-01-01 LAB — COMPREHENSIVE METABOLIC PANEL (CC13)
ALBUMIN: 3.1 g/dL — AB (ref 3.5–5.0)
ALK PHOS: 154 U/L — AB (ref 40–150)
ALT: 23 U/L (ref 0–55)
AST: 27 U/L (ref 5–34)
Anion Gap: 7 mEq/L (ref 3–11)
BUN: 10.7 mg/dL (ref 7.0–26.0)
CO2: 22 mEq/L (ref 22–29)
Calcium: 9.2 mg/dL (ref 8.4–10.4)
Chloride: 109 mEq/L (ref 98–109)
Creatinine: 0.8 mg/dL (ref 0.7–1.3)
Glucose: 94 mg/dl (ref 70–140)
POTASSIUM: 3.9 meq/L (ref 3.5–5.1)
Sodium: 139 mEq/L (ref 136–145)
Total Bilirubin: 1.13 mg/dL (ref 0.20–1.20)
Total Protein: 7 g/dL (ref 6.4–8.3)

## 2014-01-01 MED ORDER — IOHEXOL 300 MG/ML  SOLN
80.0000 mL | Freq: Once | INTRAMUSCULAR | Status: AC | PRN
  Administered 2014-01-01: 80 mL via INTRAVENOUS

## 2014-01-03 ENCOUNTER — Ambulatory Visit (HOSPITAL_BASED_OUTPATIENT_CLINIC_OR_DEPARTMENT_OTHER): Payer: Medicare Other | Admitting: Internal Medicine

## 2014-01-03 ENCOUNTER — Encounter: Payer: Self-pay | Admitting: Internal Medicine

## 2014-01-03 ENCOUNTER — Telehealth: Payer: Self-pay | Admitting: Internal Medicine

## 2014-01-03 ENCOUNTER — Other Ambulatory Visit: Payer: Medicare Other | Admitting: Lab

## 2014-01-03 VITALS — BP 156/85 | HR 103 | Temp 97.9°F | Resp 18 | Ht 72.0 in | Wt 207.4 lb

## 2014-01-03 DIAGNOSIS — C3410 Malignant neoplasm of upper lobe, unspecified bronchus or lung: Secondary | ICD-10-CM

## 2014-01-03 NOTE — Progress Notes (Signed)
[facility] [phone]   [phone]  OFFICE PROGRESS NOTE  [name], MD [place] [id number]  DIAGNOSIS: Stage IIIA/IIIB non-small cell lung cancer   PRIOR THERAPY:  1) Concurrent chemoradiation with chemotherapy in the form of weekly carboplatin for an AUC of 2 and paclitaxel at 45 mg per meter squared.  2) Consolidation chemotherapy with carboplatin for AUC of 5 and paclitaxel 175 mg/M2 every 3 weeks with Neulasta support, status post 3 cycles, last cycle was given on 01/05/2012 with partial response.   CURRENT THERAPY: Observation.  INTERVAL HISTORY: Joseph [name] 73 y.o. male returns to the clinic today for followup visit accompanied by his wife. The patient is feeling fine today with no specific complaints. On 10/31/2013 the patient underwent Aorto to left and right external iliac bypass, left internal iliac artery bypass, ligation right internal iliac aneurysm, reimplantation of inferior mesenteric artery. Thrombectomy of left and right iliac and femoral arteries. This was complicated with respiratory failure in addition to acute blood loss anemia and the patient received 6 units of FFP and 4 units of PRBCs in addition to 2 units of platelets during his admission. He was intubated and sedated for several days. He is feeling much better today and recovered very well. He denied having any significant chest pain, shortness of breath, cough or hemoptysis. He denied having any significant weight loss or night sweats. The patient had repeat CT scan of the chest performed recently and he is here for evaluation and discussion of his scan results.  MEDICAL HISTORY: Past Medical History  Diagnosis Date  . History of colon polyps 2006,2008  . 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 Hyperlipidemia     takes Pravastatin  . Emphysema   . Enlarged prostate     takes Rapaflo daily  . Arthritis     left knee and back arthrtis  .  Hypothyroidism     takes Synthroid daily  . Cataract   . Hx of radiation therapy 09/02/11 to 10/19/11    chest  . Diverticulosis   . Cirrhosis     By radiography, seen on CT  . Rosacea   . CAD (coronary artery disease)   . Shortness of breath   . Heart murmur     teen  . Lung cancer     right upper lobe    ALLERGIES:  has No Known Allergies.  MEDICATIONS:  Current Outpatient Prescriptions  Medication Sig Dispense Refill  . atorvastatin (LIPITOR) 10 MG tablet Take 1 tablet (10 mg total) by mouth daily.  30 tablet  5  . doxycycline (VIBRAMYCIN) 100 MG capsule Take 100 mg by mouth 2 (two) times daily.      Marland Kitchen FINACEA 15 % cream Apply 1 application topically as directed.      . furosemide (LASIX) 20 MG tablet Take 20 mg by mouth daily. Not started yet      . HYDROcodone-acetaminophen (NORCO/VICODIN) 5-325 MG per tablet Take 1 tablet by mouth every 6 (six) hours as needed.  30 tablet  0  . levofloxacin (LEVAQUIN) 500 MG tablet Take 1 tablet (500 mg total) by mouth daily.  7 tablet  0  . lidocaine-prilocaine (EMLA) cream Apply 1 application topically as needed.        . metroNIDAZOLE (METROGEL) 0.75 % gel Apply 1 application topically 2 (two) times daily.      Marland Kitchen MILK THISTLE PO Take 1 capsule by  mouth daily. Take one 1200 mg capsule daily      . Multiple Vitamins-Minerals (MULTIVITAMINS THER. W/MINERALS) TABS Take 1 tablet by mouth daily.        Marland Kitchen nystatin-triamcinolone (MYCOLOG II) cream Apply 1 application topically daily as needed. Rash      . OVER THE COUNTER MEDICATION 30 mg 3 (three) times daily. LUNG SUPPORT      . OVER THE COUNTER MEDICATION [place]  TAIL MUSHROOM  Takes 1 gr daily      . SYNTHROID 175 MCG tablet Take 175 mcg by mouth daily.       . tamsulosin (FLOMAX) 0.4 MG CAPS Take 0.4 mg by mouth daily.      Marland Kitchen tiotropium (SPIRIVA) 18 MCG inhalation capsule Place 18 mcg into inhaler and inhale daily.       No current facility-administered medications for this visit.     SURGICAL HISTORY:  Past Surgical History  Procedure Laterality Date  . Hernia repair  70's  . Vasectomy  70's  . Finger surgery  2008    left pointer finger  . Knee arthroscopy  2008    left knee  . Tonsillectomy      as a child  . Colonoscopy w/ biopsies      multiple   . Cardiac catheterization  2003  . Esophagogastroduodenoscopy  2006  . Portacath placement  08/10/2011    Procedure: INSERTION PORT-A-CATH;  Surgeon: Pierre Bali, MD;  Location: [facility];  Service: Thoracic;  Laterality: Left;  Left Subclavian  . Abdominal aortic aneurysm repair N/A 10/31/2013    Procedure: aorto to left external iliac and right external iliac and left internal iliac, reimplantation of inferior mesenteric artery and ligation of left iliac artery aneursym;  Surgeon: Elam Dutch, MD;  Location: [facility];  Service: Vascular;  Laterality: N/A;  . Thrombectomy iliac artery Bilateral 10/31/2013    Procedure: THROMBECTOMY ILIAC ARTERY;  Surgeon: Elam Dutch, MD;  Location: [facility];  Service: Vascular;  Laterality: Bilateral;  . Abdominal wound dehiscence N/A 11/10/2013    Procedure: ABDOMINAL WOUND CLOSURE ;  Surgeon: Serafina Mitchell, MD;  Location: [facility];  Service: Vascular;  Laterality: N/A;    REVIEW OF SYSTEMS:  A comprehensive review of systems was negative.   PHYSICAL EXAMINATION: General appearance: alert, cooperative and no distress Head: Normocephalic, without obvious abnormality, atraumatic Neck: no adenopathy Lymph nodes: Cervical, supraclavicular, and axillary nodes normal. Resp: clear to auscultation bilaterally Cardio: regular rate and rhythm, S1, S2 normal, no murmur, click, rub or gallop GI: soft, non-tender; bowel sounds normal; no masses,  no organomegaly Extremities: extremities normal, atraumatic, no cyanosis or edema  ECOG PERFORMANCE STATUS: 0 - Asymptomatic  There were no vitals taken for this visit.  LABORATORY DATA: Lab Results  Component Value Date   WBC 5.6  01/01/2014   HGB 13.7 01/01/2014   HCT 40.6 01/01/2014   MCV 97.3 01/01/2014   PLT 197 01/01/2014      Chemistry      Component Value Date/Time   NA 139 01/01/2014 1103   NA 131* 11/12/2013 0425   K 3.9 01/01/2014 1103   K 3.6* 11/12/2013 0425   CL 95* 11/12/2013 0425   CL 109* 08/01/2012 1046   CO2 22 01/01/2014 1103   CO2 24 11/12/2013 0425   BUN 10.7 01/01/2014 1103   BUN 14 11/12/2013 0425   CREATININE 0.8 01/01/2014 1103   CREATININE 0.91 11/12/2013 0425   CREATININE 1.00 08/17/2013 1250  Component Value Date/Time   CALCIUM 9.2 01/01/2014 1103   CALCIUM 7.8* 11/12/2013 0425   ALKPHOS 154* 01/01/2014 1103   ALKPHOS 44 11/01/2013 0345   AST 27 01/01/2014 1103   AST 29 11/01/2013 0345   ALT 23 01/01/2014 1103   ALT 13 11/01/2013 0345   BILITOT 1.13 01/01/2014 1103   BILITOT 1.9* 11/01/2013 0345       RADIOGRAPHIC STUDIES: Ct Chest W Contrast  01/01/2014   CLINICAL DATA:  Lung cancer.  Shortness of breath.  EXAM: CT CHEST WITH CONTRAST  TECHNIQUE: Multidetector CT imaging of the chest was performed during intravenous contrast administration.  CONTRAST:  27mL OMNIPAQUE IOHEXOL 300 MG/ML  SOLN  COMPARISON:  07/17/2013  FINDINGS: The tip of the left-sided Port-A-Cath is positioned in the upper to mid SVC.  There is no axillary lymphadenopathy. No mediastinal lymphadenopathy. 7 mm AP window lymph node measured on the previous study now measures 6 mm. There is no left hilar lymphadenopathy. Post treatment changes in the right hilum are stable.  Heart size is normal. Coronary artery calcification is noted. 7 mm short axis juxta cardiac lymph node on image 50 is unchanged.  Emphysema is again seen bilaterally. 6 x 12 mm soft tissue attenuating lesion in the medial right upper lobe remains difficult to measure but does not appear significantly changed in the interval. There is evidence of some progressive volume loss in the posterior right upper lung.  9 mm nodule in the right mid lung is stable.  No new or progressive  pulmonary parenchymal nodule or mass.  Images which include the upper abdomen show no evidence for an adrenal nodule.  Bone windows reveal no worrisome lytic or sclerotic osseous lesions.  IMPRESSION: 1. Stable exam.  No new or progressive findings. 2. No change in the right upper lobe index lesion and nodule identified previously in the right mid lung. 3. Advanced emphysema.   Electronically Signed   By: Misty Stanley M.D.   On: 01/01/2014 13:40   ASSESSMENT AND PLAN: This is a very pleasant 73 years old white male with history of stage IIIA/IIIB non-small cell lung cancer status post concurrent chemoradiation followed by consolidation chemotherapy and he has been observation since May of 2013 with no evidence for disease progression. I discussed the scan results with the patient and his wife. I recommended for him to continue on observation with repeat CT scan of the chest in 6 months.  The patient and his wife had several questions as usual about his current disease status and prognosis and I answered them completely to their satisfaction. The patient was advised to call immediately if he has any concerning symptoms in the interval. All questions were answered. The patient knows to call the clinic with any problems, questions or concerns. We can certainly see the patient much sooner if necessary.  Disclaimer: This note was dictated with voice recognition software. Similar sounding words can inadvertently be transcribed and may not be corrected upon review.

## 2014-01-03 NOTE — Telephone Encounter (Signed)
Pt called and schedule flushes all the way to october

## 2014-01-03 NOTE — Telephone Encounter (Signed)
Gave pt appt fotr lab and MD for november 2015

## 2014-01-03 NOTE — Telephone Encounter (Signed)
gave pt appt for lab and Md for november 2015

## 2014-01-08 ENCOUNTER — Encounter: Payer: Self-pay | Admitting: *Deleted

## 2014-01-16 ENCOUNTER — Encounter: Payer: Self-pay | Admitting: Cardiology

## 2014-01-16 ENCOUNTER — Ambulatory Visit (INDEPENDENT_AMBULATORY_CARE_PROVIDER_SITE_OTHER): Payer: Medicare Other | Admitting: Cardiology

## 2014-01-16 VITALS — BP 128/82 | HR 106

## 2014-01-16 DIAGNOSIS — I714 Abdominal aortic aneurysm, without rupture, unspecified: Secondary | ICD-10-CM

## 2014-01-16 DIAGNOSIS — J810 Acute pulmonary edema: Secondary | ICD-10-CM

## 2014-01-16 DIAGNOSIS — I2510 Atherosclerotic heart disease of native coronary artery without angina pectoris: Secondary | ICD-10-CM

## 2014-01-16 DIAGNOSIS — E876 Hypokalemia: Secondary | ICD-10-CM

## 2014-01-16 DIAGNOSIS — I4891 Unspecified atrial fibrillation: Secondary | ICD-10-CM

## 2014-01-16 DIAGNOSIS — E785 Hyperlipidemia, unspecified: Secondary | ICD-10-CM

## 2014-01-16 MED ORDER — FUROSEMIDE 20 MG PO TABS: 20.0000 mg | ORAL_TABLET | Freq: Every day | ORAL | Status: DC

## 2014-01-16 NOTE — Patient Instructions (Signed)
CONTINUE TAKING YOUR LASIX 20 MG DAILY  Your physician recommends that you return for lab work in: [place] [address] [place].  Your physician recommends that you schedule a follow-up appointment in: [place]

## 2014-01-16 NOTE — Progress Notes (Signed)
Patient ID: Joseph [name], male   DOB: 06/21/1941, 73 y.o.   MRN: [id number]    Patient Name: Joseph [name]  Date of Encounter: 09/29/2013  Primary Care Provider: Janalyn Rouse, MD   Primary Cardiologist: Ena Dawley, H   Problem List  Past Medical History   Diagnosis  Date   .  History of colon polyps  2006,2008   .  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  Hyperlipidemia      takes Pravastatin   .  Emphysema    .  Lung cancer      right upper lobe   .  Enlarged prostate      takes Rapaflo daily   .  Arthritis      left knee and back arthrtis   .  Hypothyroidism      takes Synthroid daily   .  Cataract    .  Hx of radiation therapy  09/02/11 to 10/19/11     chest   .  Diverticulosis    .  Cirrhosis      By radiography, seen on CT   .  Rosacea     Past Surgical History   Procedure  Laterality  Date   .  Hernia repair   70's   .  Vasectomy   70's   .  Finger surgery   2008     left pointer finger   .  Knee arthroscopy   2008     left knee   .  Tonsillectomy       as a child   .  Colonoscopy w/ biopsies       multiple   .  Cardiac catheterization   2003   .  Esophagogastroduodenoscopy   2006   .  Portacath placement   08/10/2011     Procedure: INSERTION PORT-A-CATH; Surgeon: Pierre Bali, MD; Location: [facility] OR; Service: Thoracic; Laterality: Left; Left Subclavian   Allergies  No Known Allergies  HPI  73 year old male with prior medical history of hyperlipidemia, known nonobstructive coronary artery disease was originally referred to [place] for preop evaluation prior to 4.4 cm AAA repair.  The patient states that approximately 10 years ago he was evaluated for coronary artery disease because of abnormal EKG that was followed by abnormal stress test and a cardiac catheterization that only showed minimal atherosclerotic plaque. The patient also has a history of lung cancer for which she has been treated with chemotherapy and radiation therapy in 2012 and is  currently in remission. The patient quit smoking 2 years ago and is being treated for COPD and emphysema with Spiriva.   On 10/31/2013 the patient underwent Aorto to left and right external iliac bypass, left internal iliac artery bypass, ligation right internal iliac aneurysm, reimplantation of inferior mesenteric artery. Thrombectomy of left and right iliac and femoral arteries. This was complicated with respiratory failure in addition to acute blood loss anemia and the patient received 6 units of FFP and 4 units of PRBCs in addition to 2 units of platelets during his admission. He was intubated and sedated for several days. He is feeling much better today and recovered very well. He denied having any significant chest pain, shortness of breath, cough or hemoptysis. He feels overall very tired and hasn't been back to preop baseline yet. He states that post surgery he retained 20 lbs of fluids but now is bak to baseline. Has some residual minimal LE edema.  He has known hyperlipidemia however he developed liver enzymes elevation after his chemotherapy and is being followed a GI specialist Dr. Charlott Holler, he has never been started on statins. He denies any alcohol intake.  Home Medications  Prior to Admission medications   Medication  Sig  Start Date  End Date  Taking?  Authorizing Provider   doxycycline (VIBRAMYCIN) 100 MG capsule  Take 100 mg by mouth 2 (two) times daily.    Yes  Historical Provider, MD   FINACEA 15 % cream  Apply 1 application topically as directed.  12/01/12   Yes  Historical Provider, MD   HYDROcodone-acetaminophen (NORCO) 5-325 MG per tablet  Take 1 tablet by mouth every 6 (six) hours as needed for pain.  01/04/12   Yes  Carlton Adam, PA-C   lidocaine-prilocaine (EMLA) cream  Apply 1 application topically as needed.  08/06/11   Yes  Curt Bears, MD   metroNIDAZOLE (METROGEL) 0.75 % gel  Apply 1 application topically 2 (two) times daily.    Yes  Historical Provider, MD   MILK  THISTLE PO  Take 1 capsule by mouth daily. Take one 1200 mg capsule daily    Yes  Historical Provider, MD   Multiple Vitamins-Minerals (MULTIVITAMINS THER. W/MINERALS) TABS  Take 1 tablet by mouth daily.    Yes  Historical Provider, MD   nystatin-triamcinolone (MYCOLOG II) cream  Apply 1 application topically daily as needed.  07/10/11   Yes  Historical Provider, MD   OVER THE COUNTER MEDICATION  30 mg 3 (three) times daily. LUNG SUPPORT    Yes  Historical Provider, MD   OVER THE COUNTER MEDICATION  [place] TAIL MUSHROOM Takes 1 gr daily    Yes  Historical Provider, MD   SYNTHROID 175 MCG tablet  Take 175 mcg by mouth daily.  06/08/11   Yes  Stephens Shire, MD   tamsulosin (FLOMAX) 0.4 MG CAPS  Take 0.4 mg by mouth daily.    Yes  Historical Provider, MD   tiotropium (SPIRIVA) 18 MCG inhalation capsule  Place 18 mcg into inhaler and inhale daily.    Yes  Historical Provider, MD    Family History  Family History   Problem  Relation  Age of Onset   .  Anesthesia problems  Neg Hx    .  Hypotension  Neg Hx    .  Malignant hyperthermia  Neg Hx    .  Pseudochol deficiency  Neg Hx    .  Cancer  Father    .  Diabetes  Father    .  Hyperlipidemia  Brother    .  Aneurysm  Brother      Abdominal aortic aneurysm   .  Hyperlipidemia  Mother    .  Hyperlipidemia  Sister    Social History  History    Social History   .  Marital Status:  Married     Spouse Name:  N/A     Number of Children:  N/A   .  Years of Education:  N/A    Occupational History   .  Not on file.    Social History Main Topics   .  Smoking status:  Former Smoker -- 1.50 packs/day for 40 years     Types:  Cigarettes     Quit date:  07/02/2011   .  Smokeless tobacco:  Former Systems developer   .  Alcohol Use:  1.8 oz/week     3 Cans of  beer per week      Comment: social   .  Drug Use:  No   .  Sexual Activity:  Not on file    Other Topics  Concern   .  Not on file    Social History Narrative   .  No narrative on file   Review of  Systems, as per HPI, otherwise negative  General: No chills, fever, night sweats or weight changes.  Cardiovascular: No chest pain, dyspnea on exertion, edema, orthopnea, palpitations, paroxysmal nocturnal dyspnea.  Dermatological: No rash, lesions/masses  Respiratory: No cough, dyspnea  Urologic: No hematuria, dysuria  Abdominal: No nausea, vomiting, diarrhea, bright red blood per rectum, melena, or hematemesis  Neurologic: No visual changes, wkns, changes in mental status.  All other systems reviewed and are otherwise negative except as noted above.   Physical Exam  Blood pressure 124/66, pulse 94, height 6' (1.829 m), weight 222 lb (100.699 kg).  General: Pleasant, NAD  Psych: Normal affect.  Neuro: Alert and oriented X 3. Moves all extremities spontaneously.  HEENT: Normal  Neck: Supple without bruits or JVD.  Lungs: Resp regular and unlabored, CTA.  Heart: RRR no s3, s4, or murmurs.  Abdomen: Soft, non-tender, non-distended, BS + x 4.  Extremities: No clubbing, cyanosis or edema. DP/PT/Radials 2+ and equal bilaterally.  Labs:  No results found for this basename: CKTOTAL, CKMB, TROPONINI, in the last 72 hours  Lab Results   Component  Value  Date    WBC  5.1  07/17/2013    HGB  14.6  07/17/2013    HCT  43.2  07/17/2013    MCV  99.2*  07/17/2013    PLT  134*  07/17/2013    Accessory Clinical Findings   Echocardiogram - 10/12/2013 - Left ventricle: The cavity size was normal. Systolic function was normal. The estimated ejection fraction was in the range of 55% to 60%. Wall motion was normal; there were no regional wall motion abnormalities. There was an increased relative contribution of atrial contraction to ventricular filling. Doppler parameters are consistent with abnormal left ventricular relaxation (grade 1 diastolic dysfunction). - Aortic valve: Moderate diffuse thickening and calcification, consistent with sclerosis. Trivial regurgitation. - Pulmonary arteries:  PA peak pressure: 25mm Hg (S). Impressions:  - The right ventricular systolic pressure was increased consistent with mild pulmonary hypertension  ECG - sinus rhythm, nonspecific ST-T wave abnormalities, abnormal EKG appear     Assessment & Plan   This is a 73 year old male with a history of known nonobstructive CAD and HLP,  post Aorto to left and right external iliac bypass, left internal iliac artery bypass, ligation right internal iliac aneurysm, reimplantation of inferior mesenteric artery. Thrombectomy of left and right iliac and femoral arteries.  1. 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 Appears almost euvolemic, we will prescribe lasix 20 mg po daily and follow up in 1 month with BMP.  2. Non obstructive CAD - asymptomatic, no statins as prior liver enzymes elevation, restart aspirin, consider betablocker at the next visit  3. COPD - on spiriva  4. HLP - we will check at the next visit.  Follow up in 1 month.   Dorothy Spark, MD, [facility]  09/29/2013, 1:59 PM

## 2014-01-31 ENCOUNTER — Encounter: Payer: Self-pay | Admitting: Vascular Surgery

## 2014-02-01 ENCOUNTER — Ambulatory Visit (INDEPENDENT_AMBULATORY_CARE_PROVIDER_SITE_OTHER): Payer: Medicare Other | Admitting: Vascular Surgery

## 2014-02-01 ENCOUNTER — Encounter: Payer: Self-pay | Admitting: Vascular Surgery

## 2014-02-01 VITALS — BP 119/85 | HR 101 | Ht 72.0 in | Wt 205.0 lb

## 2014-02-01 DIAGNOSIS — I714 Abdominal aortic aneurysm, without rupture, unspecified: Secondary | ICD-10-CM

## 2014-02-01 DIAGNOSIS — R0781 Pleurodynia: Secondary | ICD-10-CM

## 2014-02-01 DIAGNOSIS — R079 Chest pain, unspecified: Secondary | ICD-10-CM

## 2014-02-01 DIAGNOSIS — Z48812 Encounter for surgical aftercare following surgery on the circulatory system: Secondary | ICD-10-CM

## 2014-02-01 MED ORDER — OXYCODONE-ACETAMINOPHEN 5-325 MG PO TABS: 1.0000 | ORAL_TABLET | ORAL | Status: DC | PRN

## 2014-02-01 NOTE — Progress Notes (Signed)
Patient is a 73 year old male who returns for followup today after abdominal aortic aneurysm repair March 2015. He had aorto to right external iliac and left external iliac with a bypass off the left limb to the left internal iliac, ligation of the right internal iliac aneurysm and reimplantation of his inferior mesenteric artery. He reports his bowel function has normalized. He has no nausea or vomiting. His activity level is slowly increasing. He occasionally has some minor pain in his left anterior thigh. He also has a patch of numbness on the left lateral aspect of his thigh. He also complains of some pain over his pubic bone occasionally.  Physical exam:     Filed Vitals:   02/01/14 0905  BP: 119/85  Pulse: 101  Height: 6' (1.829 m)  Weight: 205 lb (92.987 kg)  SpO2: 98%    Abdomen: Incision healing well  no evidence of hernia, palpable area of suture upper third of midline incision  Extremities: 2+ femoral pulses bilaterally  Assessment: Continuing to slowly improve and recover from recent abdominal aortic aneurysm repair.    Plan: Followup March 2016 CT Angio the abdomen and pelvis at that time.  He'll return sooner if any problems.  Ruta Hinds, MD [facility] Office: [phone] Pager: [phone]

## 2014-02-12 ENCOUNTER — Ambulatory Visit (HOSPITAL_BASED_OUTPATIENT_CLINIC_OR_DEPARTMENT_OTHER): Payer: Medicare Other

## 2014-02-12 VITALS — BP 111/69 | HR 97 | Temp 97.0°F

## 2014-02-12 DIAGNOSIS — Z452 Encounter for adjustment and management of vascular access device: Secondary | ICD-10-CM

## 2014-02-12 DIAGNOSIS — C3410 Malignant neoplasm of upper lobe, unspecified bronchus or lung: Secondary | ICD-10-CM

## 2014-02-12 DIAGNOSIS — Z95828 Presence of other vascular implants and grafts: Secondary | ICD-10-CM

## 2014-02-12 MED ORDER — SODIUM CHLORIDE 0.9 % IJ SOLN
10.0000 mL | INTRAMUSCULAR | Status: DC | PRN
  Administered 2014-02-12: 10 mL via INTRAVENOUS
  Filled 2014-02-12: qty 10

## 2014-02-12 MED ORDER — HEPARIN SOD (PORK) LOCK FLUSH 100 UNIT/ML IV SOLN
500.0000 [IU] | Freq: Once | INTRAVENOUS | Status: AC
  Administered 2014-02-12: 500 [IU] via INTRAVENOUS
  Filled 2014-02-12: qty 5

## 2014-02-12 NOTE — Patient Instructions (Signed)

## 2014-02-19 ENCOUNTER — Other Ambulatory Visit (INDEPENDENT_AMBULATORY_CARE_PROVIDER_SITE_OTHER): Payer: Medicare Other

## 2014-02-19 DIAGNOSIS — I714 Abdominal aortic aneurysm, without rupture, unspecified: Secondary | ICD-10-CM

## 2014-02-19 DIAGNOSIS — I2510 Atherosclerotic heart disease of native coronary artery without angina pectoris: Secondary | ICD-10-CM

## 2014-02-19 DIAGNOSIS — E876 Hypokalemia: Secondary | ICD-10-CM

## 2014-02-19 DIAGNOSIS — J810 Acute pulmonary edema: Secondary | ICD-10-CM

## 2014-02-19 DIAGNOSIS — E785 Hyperlipidemia, unspecified: Secondary | ICD-10-CM

## 2014-02-19 DIAGNOSIS — I4891 Unspecified atrial fibrillation: Secondary | ICD-10-CM

## 2014-02-19 LAB — BASIC METABOLIC PANEL
BUN: 15 mg/dL (ref 6–23)
CO2: 25 mEq/L (ref 19–32)
Calcium: 8.8 mg/dL (ref 8.4–10.5)
Chloride: 106 mEq/L (ref 96–112)
Creatinine, Ser: 0.9 mg/dL (ref 0.4–1.5)
GFR: 93.13 mL/min (ref >60.00)
Glucose, Bld: 87 mg/dL (ref 70–99)
Potassium: 4 mEq/L (ref 3.5–5.1)
Sodium: 139 mEq/L (ref 135–145)

## 2014-02-26 ENCOUNTER — Ambulatory Visit (INDEPENDENT_AMBULATORY_CARE_PROVIDER_SITE_OTHER): Payer: Medicare Other | Admitting: Cardiology

## 2014-02-26 ENCOUNTER — Encounter: Payer: Self-pay | Admitting: Cardiology

## 2014-02-26 VITALS — BP 120/80 | HR 85 | Ht 72.0 in | Wt 207.0 lb

## 2014-02-26 DIAGNOSIS — I714 Abdominal aortic aneurysm, without rupture, unspecified: Secondary | ICD-10-CM

## 2014-02-26 DIAGNOSIS — I5032 Chronic diastolic (congestive) heart failure: Secondary | ICD-10-CM

## 2014-02-26 DIAGNOSIS — K7460 Unspecified cirrhosis of liver: Secondary | ICD-10-CM

## 2014-02-26 DIAGNOSIS — IMO0001 Reserved for inherently not codable concepts without codable children: Secondary | ICD-10-CM

## 2014-02-26 DIAGNOSIS — E785 Hyperlipidemia, unspecified: Secondary | ICD-10-CM

## 2014-02-26 DIAGNOSIS — M791 Myalgia, unspecified site: Secondary | ICD-10-CM

## 2014-02-26 DIAGNOSIS — E876 Hypokalemia: Secondary | ICD-10-CM

## 2014-02-26 DIAGNOSIS — I2510 Atherosclerotic heart disease of native coronary artery without angina pectoris: Secondary | ICD-10-CM

## 2014-02-26 DIAGNOSIS — I4891 Unspecified atrial fibrillation: Secondary | ICD-10-CM

## 2014-02-26 DIAGNOSIS — J810 Acute pulmonary edema: Secondary | ICD-10-CM

## 2014-02-26 DIAGNOSIS — I509 Heart failure, unspecified: Secondary | ICD-10-CM

## 2014-02-26 LAB — HEPATIC FUNCTION PANEL
ALT: 45 U/L (ref 0–53)
AST: 51 U/L — ABNORMAL HIGH (ref 0–37)
Albumin: 3.4 g/dL — ABNORMAL LOW (ref 3.5–5.2)
Alkaline Phosphatase: 242 U/L — ABNORMAL HIGH (ref 39–117)
Bilirubin, Direct: 0.2 mg/dL (ref 0.0–0.3)
Total Bilirubin: 1.3 mg/dL — ABNORMAL HIGH (ref 0.2–1.2)
Total Protein: 7.7 g/dL (ref 6.0–8.3)

## 2014-02-26 LAB — CK: Total CK: 46 U/L (ref 7–232)

## 2014-02-26 NOTE — Addendum Note (Signed)
Addended by: Nuala Alpha on: 02/26/2014 12:52 PM   Modules accepted: Orders

## 2014-02-26 NOTE — Patient Instructions (Signed)
Your physician recommends that you continue on your current medications as directed. Please refer to the Current Medication list given to you today.  Your physician recommends that you return for lab work in: TODAY (CPK, LFT)  Your physician wants you to follow-up in: [place] will receive a reminder letter in the mail two months in advance. If you don't receive a letter, please call our office to schedule the follow-up appointment.  DR Meda Coffee WOULD ALSO LIKE TO CHECK YOUR LABS AT YOUR 6 MONTH F/U APPT- (BNP)

## 2014-02-26 NOTE — Progress Notes (Signed)
Patient ID: Joseph [name], male   DOB: [birth year], 73 y.o.   MRN: [id number]    Patient Name: Joseph [name]  Date of Encounter: 09/29/2013  Primary Care Provider: Janalyn Rouse, MD   Primary Cardiologist: Ena Dawley, H   Problem List  Past Medical History   Diagnosis  Date   .  History of colon polyps  2006,2008   .  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  Hyperlipidemia      takes Pravastatin   .  Emphysema    .  Lung cancer      right upper lobe   .  Enlarged prostate      takes Rapaflo daily   .  Arthritis      left knee and back arthrtis   .  Hypothyroidism      takes Synthroid daily   .  Cataract    .  Hx of radiation therapy  09/02/11 to 10/19/11     chest   .  Diverticulosis    .  Cirrhosis      By radiography, seen on CT   .  Rosacea     Past Surgical History   Procedure  Laterality  Date   .  Hernia repair   70's   .  Vasectomy   70's   .  Finger surgery   2008     left pointer finger   .  Knee arthroscopy   2008     left knee   .  Tonsillectomy       as a child   .  Colonoscopy w/ biopsies       multiple   .  Cardiac catheterization   2003   .  Esophagogastroduodenoscopy   2006   .  Portacath placement   08/10/2011     Procedure: INSERTION PORT-A-CATH; Surgeon: Pierre Bali, MD; Location: [facility] OR; Service: Thoracic; Laterality: Left; Left Subclavian   Allergies  No Known Allergies    HPI  73 year old male with prior medical history of hyperlipidemia, known nonobstructive coronary artery disease was originally referred to [place] for preop evaluation prior to 4.4 cm AAA repair.  The patient states that approximately 10 years ago he was evaluated for coronary artery disease because of abnormal EKG that was followed by abnormal stress test and a cardiac catheterization that only showed minimal atherosclerotic plaque. The patient also has a history of lung cancer for which she has been treated with chemotherapy and radiation therapy in 2012 and is  currently in remission. The patient quit smoking 2 years ago and is being treated for COPD and emphysema with Spiriva.   On 10/31/2013 the patient underwent Aorto to left and right external iliac bypass, left internal iliac artery bypass, ligation right internal iliac aneurysm, reimplantation of inferior mesenteric artery. Thrombectomy of left and right iliac and femoral arteries. This was complicated with respiratory failure in addition to acute blood loss anemia and the patient received 6 units of FFP and 4 units of PRBCs in addition to 2 units of platelets during his admission. He was intubated and sedated for several days. He is feeling much better today and recovered very well. He denied having any significant chest pain, shortness of breath, cough or hemoptysis. He feels overall very tired and hasn't been back to preop baseline yet. He states that post surgery he retained 20 lbs of fluids but now is bak to baseline. Has some residual minimal LE  edema.   He has known hyperlipidemia however he developed liver enzymes elevation after his chemotherapy and is being followed a GI specialist Dr. Charlott Holler, he has never been started on statins. He denies any alcohol intake.  02/26/2014 - the patient states that he swelling has improved significantly especially of his scrotum. He is currently asymptomatic from chest pain or shortness of breath however he complains of overall fatigue and mild muscle pain. He stopped taking Lipitor because of that.  Home Medications  Prior to Admission medications   Medication  Sig  Start Date  End Date  Taking?  Authorizing Provider   doxycycline (VIBRAMYCIN) 100 MG capsule  Take 100 mg by mouth 2 (two) times daily.    Yes  Historical Provider, MD   FINACEA 15 % cream  Apply 1 application topically as directed.  12/01/12   Yes  Historical Provider, MD   HYDROcodone-acetaminophen (NORCO) 5-325 MG per tablet  Take 1 tablet by mouth every 6 (six) hours as needed for pain.   01/04/12   Yes  Carlton Adam, PA-C   lidocaine-prilocaine (EMLA) cream  Apply 1 application topically as needed.  08/06/11   Yes  Curt Bears, MD   metroNIDAZOLE (METROGEL) 0.75 % gel  Apply 1 application topically 2 (two) times daily.    Yes  Historical Provider, MD   MILK THISTLE PO  Take 1 capsule by mouth daily. Take one 1200 mg capsule daily    Yes  Historical Provider, MD   Multiple Vitamins-Minerals (MULTIVITAMINS THER. W/MINERALS) TABS  Take 1 tablet by mouth daily.    Yes  Historical Provider, MD   nystatin-triamcinolone (MYCOLOG II) cream  Apply 1 application topically daily as needed.  07/10/11   Yes  Historical Provider, MD   OVER THE COUNTER MEDICATION  30 mg 3 (three) times daily. LUNG SUPPORT    Yes  Historical Provider, MD   OVER THE COUNTER MEDICATION  [place] TAIL MUSHROOM Takes 1 gr daily    Yes  Historical Provider, MD   SYNTHROID 175 MCG tablet  Take 175 mcg by mouth daily.  06/08/11   Yes  Stephens Shire, MD   tamsulosin (FLOMAX) 0.4 MG CAPS  Take 0.4 mg by mouth daily.    Yes  Historical Provider, MD   tiotropium (SPIRIVA) 18 MCG inhalation capsule  Place 18 mcg into inhaler and inhale daily.    Yes  Historical Provider, MD    Family History  Family History   Problem  Relation  Age of Onset   .  Anesthesia problems  Neg Hx    .  Hypotension  Neg Hx    .  Malignant hyperthermia  Neg Hx    .  Pseudochol deficiency  Neg Hx    .  Cancer  Father    .  Diabetes  Father    .  Hyperlipidemia  Brother    .  Aneurysm  Brother      Abdominal aortic aneurysm   .  Hyperlipidemia  Mother    .  Hyperlipidemia  Sister    Social History  History    Social History   .  Marital Status:  Married     Spouse Name:  N/A     Number of Children:  N/A   .  Years of Education:  N/A    Occupational History   .  Not on file.    Social History Main Topics   .  Smoking status:  Former  Smoker -- 1.50 packs/day for 40 years     Types:  Cigarettes     Quit date:  07/02/2011   .   Smokeless tobacco:  Former Systems developer   .  Alcohol Use:  1.8 oz/week     3 Cans of beer per week      Comment: social   .  Drug Use:  No   .  Sexual Activity:  Not on file    Other Topics  Concern   .  Not on file    Social History Narrative   .  No narrative on file   Review of Systems, as per HPI, otherwise negative  General: No chills, fever, night sweats or weight changes.  Cardiovascular: No chest pain, dyspnea on exertion, edema, orthopnea, palpitations, paroxysmal nocturnal dyspnea.  Dermatological: No rash, lesions/masses  Respiratory: No cough, dyspnea  Urologic: No hematuria, dysuria  Abdominal: No nausea, vomiting, diarrhea, bright red blood per rectum, melena, or hematemesis  Neurologic: No visual changes, wkns, changes in mental status.  All other systems reviewed and are otherwise negative except as noted above.   Physical Exam  Blood pressure 124/66, pulse 94, height 6' (1.829 m), weight 222 lb (100.699 kg).  General: Pleasant, NAD  Psych: Normal affect.  Neuro: Alert and oriented X 3. Moves all extremities spontaneously.  HEENT: Normal  Neck: Supple without bruits or JVD.  Lungs: Resp regular and unlabored, CTA.  Heart: RRR no s3, s4, or murmurs.  Abdomen: Soft, non-tender, non-distended, BS + x 4.  Extremities: No clubbing, cyanosis or edema. DP/PT/Radials 2+ and equal bilaterally.  Labs:  No results found for this basename: CKTOTAL, CKMB, TROPONINI, in the last 72 hours  Lab Results   Component  Value  Date    WBC  5.1  07/17/2013    HGB  14.6  07/17/2013    HCT  43.2  07/17/2013    MCV  99.2*  07/17/2013    PLT  134*  07/17/2013    Accessory Clinical Findings   Echocardiogram - 10/12/2013 - Left ventricle: The cavity size was normal. Systolic function was normal. The estimated ejection fraction was in the range of 55% to 60%. Wall motion was normal; there were no regional wall motion abnormalities. There was an increased relative contribution of atrial  contraction to ventricular filling. Doppler parameters are consistent with abnormal left ventricular relaxation (grade 1 diastolic dysfunction). - Aortic valve: Moderate diffuse thickening and calcification, consistent with sclerosis. Trivial regurgitation. - Pulmonary arteries: PA peak pressure: 73mm Hg (S). Impressions:  - The right ventricular systolic pressure was increased consistent with mild pulmonary hypertension  ECG - sinus rhythm, nonspecific ST-T wave abnormalities, abnormal EKG appear     Assessment & Plan   This is a 73 year old male with a history of known nonobstructive CAD and HLP,  post Aorto to left and right external iliac bypass, left internal iliac artery bypass, ligation right internal iliac aneurysm, reimplantation of inferior mesenteric artery. Thrombectomy of left and right iliac and femoral arteries.  1. 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 He is euvolemic today, we will continue lasix 20 mg po daily and follow up in 6 month with BMP.  2. Non obstructive CAD - asymptomatic, no statins as prior liver enzymes elevation, restart aspirin, consider betablocker at the next visit  3. COPD - on spiriva  4. HLP - fatigue and muscle pain, we'll check CPK and liver enzymes. If  all labs normal we'll start patient on pravastatin 40 mg daily followed by CPK and liver enzymes checked in 3 weeks.  Follow up in 6 month.   Dorothy Spark, MD, [facility]  09/29/2013, 1:59 PM

## 2014-03-01 ENCOUNTER — Telehealth: Payer: Self-pay | Admitting: *Deleted

## 2014-03-01 NOTE — Telephone Encounter (Signed)
Patient stated that Dr Meda Coffee was going to start him on a new statin if his recent labs were ok. Please advise. Thanks, MI

## 2014-03-01 NOTE — Telephone Encounter (Signed)
Will route this message to Dr Meda Coffee for further review and recommendation

## 2014-03-01 NOTE — Telephone Encounter (Signed)
Since his LFTs are elevated I am not going to start him on a statin. The new medications called PCSK -9 inhibitors are not available for the next couple of months. We will let him know once they are on the market.

## 2014-03-08 ENCOUNTER — Telehealth: Payer: Self-pay | Admitting: Cardiology

## 2014-03-08 NOTE — Telephone Encounter (Signed)
Pt wanted to let Dr Meda Coffee know that  he did drink alcohol a couple of days in a row before he came in for his recent lab work.  Pt noted that his LFT on my chart revealed elevation, and he felt that drinking alcohol days in a row might have contributed to this.  Informed pt that Dr Meda Coffee reviewed those labs, and we addressed this on 7/1 when I called the pt.  Informed pt given his LFTs are elevated she is not going to start him on a statin.  Informed the pt that per Dr Meda Coffee new medications called PCSK -9 inhibitors are not available for the next couple of months, but we will let him know once they are on the market. Pt verbalized understanding.  Will forward this to Dr Meda Coffee for her review.

## 2014-03-08 NOTE — Telephone Encounter (Signed)
New message     Talk to a nurse regarding lab results

## 2014-03-26 ENCOUNTER — Ambulatory Visit (HOSPITAL_BASED_OUTPATIENT_CLINIC_OR_DEPARTMENT_OTHER): Payer: Medicare Other

## 2014-03-26 VITALS — BP 112/72 | HR 92 | Temp 97.0°F

## 2014-03-26 DIAGNOSIS — Z95828 Presence of other vascular implants and grafts: Secondary | ICD-10-CM

## 2014-03-26 DIAGNOSIS — Z452 Encounter for adjustment and management of vascular access device: Secondary | ICD-10-CM

## 2014-03-26 DIAGNOSIS — C3410 Malignant neoplasm of upper lobe, unspecified bronchus or lung: Secondary | ICD-10-CM

## 2014-03-26 MED ORDER — SODIUM CHLORIDE 0.9 % IJ SOLN
10.0000 mL | INTRAMUSCULAR | Status: DC | PRN
  Administered 2014-03-26: 10 mL via INTRAVENOUS
  Filled 2014-03-26: qty 10

## 2014-03-26 MED ORDER — HEPARIN SOD (PORK) LOCK FLUSH 100 UNIT/ML IV SOLN
500.0000 [IU] | Freq: Once | INTRAVENOUS | Status: AC
  Administered 2014-03-26: 500 [IU] via INTRAVENOUS
  Filled 2014-03-26: qty 5

## 2014-03-26 NOTE — Patient Instructions (Signed)

## 2014-05-08 ENCOUNTER — Ambulatory Visit (HOSPITAL_BASED_OUTPATIENT_CLINIC_OR_DEPARTMENT_OTHER): Payer: Medicare Other

## 2014-05-08 VITALS — BP 127/80 | HR 82 | Temp 97.9°F

## 2014-05-08 DIAGNOSIS — Z95828 Presence of other vascular implants and grafts: Secondary | ICD-10-CM

## 2014-05-08 DIAGNOSIS — Z452 Encounter for adjustment and management of vascular access device: Secondary | ICD-10-CM

## 2014-05-08 DIAGNOSIS — C3410 Malignant neoplasm of upper lobe, unspecified bronchus or lung: Secondary | ICD-10-CM

## 2014-05-08 MED ORDER — HEPARIN SOD (PORK) LOCK FLUSH 100 UNIT/ML IV SOLN
500.0000 [IU] | Freq: Once | INTRAVENOUS | Status: AC
  Administered 2014-05-08: 500 [IU] via INTRAVENOUS
  Filled 2014-05-08: qty 5

## 2014-05-08 MED ORDER — SODIUM CHLORIDE 0.9 % IJ SOLN
10.0000 mL | INTRAMUSCULAR | Status: DC | PRN
  Administered 2014-05-08: 10 mL via INTRAVENOUS
  Filled 2014-05-08: qty 10

## 2014-05-08 NOTE — Patient Instructions (Signed)

## 2014-05-29 ENCOUNTER — Telehealth: Payer: Self-pay | Admitting: Internal Medicine

## 2014-05-29 ENCOUNTER — Telehealth: Payer: Self-pay | Admitting: *Deleted

## 2014-05-29 NOTE — Telephone Encounter (Signed)
Patient called stating that for 2-3 weeks he has been experiencing diarrhea, stomach pain and gas.  He will have a soft stool at times and his incision is sensitive to touch.  He is s/p AAA repair March 2015. He is established with a gastroenterologist and was inquiring as to who to see for these symptoms.  I explained that the GI MD would be in order or his PCP either one who could accommodate him the soonest.   He is to call [facility] back if any vascular issues would be indicated by either MD.

## 2014-05-29 NOTE — Telephone Encounter (Signed)
Patient was in [place] for work about 3 weeks ago.  He was taking Aleve and got constipated.  He took some laxatives and relieved this.  He has been taking Aleve since his return and is having now intermittent diarrhea.  He believes it is related to diarrhea. He has multiple diarrhea stools a day.  He started himself on prevacid and gaviscon for gas, nausea, and "stomach gurgling".  He has tried imodium with good results but not wanting to take daily.  He will come in and see Tye Savoy RNP on 05/31/14 1:30

## 2014-05-31 ENCOUNTER — Ambulatory Visit (INDEPENDENT_AMBULATORY_CARE_PROVIDER_SITE_OTHER): Payer: Medicare Other | Admitting: Nurse Practitioner

## 2014-05-31 ENCOUNTER — Encounter: Payer: Self-pay | Admitting: Nurse Practitioner

## 2014-05-31 ENCOUNTER — Other Ambulatory Visit (INDEPENDENT_AMBULATORY_CARE_PROVIDER_SITE_OTHER): Payer: Medicare Other

## 2014-05-31 VITALS — BP 134/74 | HR 76 | Ht 72.0 in | Wt 210.0 lb

## 2014-05-31 DIAGNOSIS — R194 Change in bowel habit: Secondary | ICD-10-CM

## 2014-05-31 DIAGNOSIS — R197 Diarrhea, unspecified: Secondary | ICD-10-CM

## 2014-05-31 LAB — TSH: TSH: 0.78 u[IU]/mL (ref 0.35–4.50)

## 2014-05-31 NOTE — Patient Instructions (Addendum)
Your physician has requested that you go to the basement for the following lab work before leaving today: TSH Stool Culture Fecal Lactoferrin C-Diff  Keep a journal of your bowel habits.

## 2014-05-31 NOTE — Progress Notes (Signed)
     History of Present Illness:   Patient is a 73 year old male known to Dr. Carlean Purl for history of cirrhosis and adenomatous colon polyps. EGD for varices screening May 2014 was normal. Last surveillance colonoscopy, also done May 2014 with findings of diverticulosis, hemorrhoids and a sessile polyp in the ascending colon (adenoma).  Patient comes in today with wife for evaluation of recent bowel changes In late August patient took some Aleve, the next day he was constipated and took two Dulcolax. Because of resulting massive diarrhea patient then took an Imodium.  Bowel movements eventually straightened out, patient felt okay for a couple of weeks. Then 2-3 weeks ago patient took Aleve again. He subsequently developed loose stool associated with nausea, belching and gurgling. He has continued to have loose stools but interestingly this occurs after taking medications (scheduled home meds). No bowel changes related to meals. In an effort to figure this out patient held all scheduled meds except for synthroid and diarrhea subsided (except for when he took the Synthroid). Today he had a formed stool but hasn't taken synthroid yet.    Current Medications, Allergies, Past Medical History, Past Surgical History, Family History and Social History were reviewed in [employer] record.  Physical Exam: General: Pleasant, well developed , white male in no acute distress Head: Normocephalic and atraumatic Eyes:  sclerae anicteric, conjunctiva pink  Ears: Normal auditory acuity Lungs: Clear throughout to auscultation Heart: Regular rate and rhythm Abdomen: Soft, non distended, non-tender. No masses, no hepatomegaly. Normal bowel sounds Musculoskeletal: Symmetrical with no gross deformities  Extremities: No edema  Neurological: Alert oriented x 4, grossly nonfocal Psychological:  Alert and cooperative. Normal mood and affect  Assessment and Recommendations:   73 year old male  with recent bowel changes. Patient has begun having an episode of loose stool every time he takes his home meds which are of different classes and scheduled at different times. He doesn't get diarrhea after meals, nor if he holds home meds.  This is a unique presentation that I cannot explain. Patient looks and feels okay. He isn't having copius diarrhea.   Will check his TSH.   Suspect there is a missing variable that has not yet come to patient's mind. He will continue to monitor bowel patterns in relation to diet and medication. He knows to send in stool samples if he develops diarrhea.   He is up to date on surveillance colonoscopy, last one 1.5 years ago

## 2014-06-01 ENCOUNTER — Encounter: Payer: Self-pay | Admitting: Nurse Practitioner

## 2014-06-01 ENCOUNTER — Telehealth: Payer: Self-pay | Admitting: Internal Medicine

## 2014-06-01 DIAGNOSIS — R194 Change in bowel habit: Secondary | ICD-10-CM | POA: Insufficient documentation

## 2014-06-01 NOTE — Telephone Encounter (Signed)
s.w. pt and advised on time change....pt ok and aware

## 2014-06-03 NOTE — Progress Notes (Signed)
Agree with Ms. Guenther's assessment and plan. Carl E. Gessner, MD, FACG   

## 2014-06-06 ENCOUNTER — Other Ambulatory Visit: Payer: Medicare Other

## 2014-06-06 ENCOUNTER — Other Ambulatory Visit: Payer: Self-pay | Admitting: Nurse Practitioner

## 2014-06-06 DIAGNOSIS — R197 Diarrhea, unspecified: Secondary | ICD-10-CM

## 2014-06-07 ENCOUNTER — Encounter: Payer: Self-pay | Admitting: Internal Medicine

## 2014-06-07 LAB — FECAL LACTOFERRIN, QUANT: LACTOFERRIN: POSITIVE

## 2014-06-14 ENCOUNTER — Encounter: Payer: Self-pay | Admitting: Internal Medicine

## 2014-06-14 NOTE — Telephone Encounter (Signed)
I contacted [facility].  They did not run the stool culture. The C-diff PCR was negative.  They are faxing a copy.  Dr. Carlean Purl see message from the patient and advise

## 2014-06-15 ENCOUNTER — Other Ambulatory Visit: Payer: Self-pay

## 2014-06-15 ENCOUNTER — Telehealth: Payer: Self-pay | Admitting: Internal Medicine

## 2014-06-15 DIAGNOSIS — K7469 Other cirrhosis of liver: Secondary | ICD-10-CM

## 2014-06-15 DIAGNOSIS — R1010 Upper abdominal pain, unspecified: Secondary | ICD-10-CM

## 2014-06-15 DIAGNOSIS — M5489 Other dorsalgia: Secondary | ICD-10-CM

## 2014-06-15 MED ORDER — METRONIDAZOLE 250 MG PO TABS: 250.0000 mg | ORAL_TABLET | Freq: Three times a day (TID) | ORAL | Status: DC

## 2014-06-15 NOTE — Telephone Encounter (Signed)
Patient notified of abdominal US for 06/19/14 8:00 and he is asked to arrive in radiology at [facility] by 7:45 and be NPO after midnight

## 2014-06-15 NOTE — Telephone Encounter (Signed)
I called results - C diff PCR negative but lactoferrin was + Cx unable to be run per [employer]  He continues with intermittent diarrhea but also has some NL stools  Has intersacpular pains relieved by heating pad + boroborygmi  Not clear weherwee all sxs from ? bacteral overgrowth ? Gallstons  1) Metronidazole 250 mg tid x 10d Rx sent by me 2) Schedule US abdomen - re: abdominal pain and back pain and hx cirrhosis   Note he has a port flush at cancer center 10/19 at 11 AM  He is holding many meds including doxy  Have cced Nevin Bloodgood as an [place]

## 2014-06-18 ENCOUNTER — Telehealth: Payer: Self-pay

## 2014-06-18 ENCOUNTER — Ambulatory Visit (HOSPITAL_BASED_OUTPATIENT_CLINIC_OR_DEPARTMENT_OTHER): Payer: Medicare Other

## 2014-06-18 VITALS — BP 124/80 | HR 101 | Temp 97.6°F

## 2014-06-18 DIAGNOSIS — Z452 Encounter for adjustment and management of vascular access device: Secondary | ICD-10-CM

## 2014-06-18 DIAGNOSIS — Z95828 Presence of other vascular implants and grafts: Secondary | ICD-10-CM

## 2014-06-18 DIAGNOSIS — C3410 Malignant neoplasm of upper lobe, unspecified bronchus or lung: Secondary | ICD-10-CM

## 2014-06-18 MED ORDER — HEPARIN SOD (PORK) LOCK FLUSH 100 UNIT/ML IV SOLN
500.0000 [IU] | Freq: Once | INTRAVENOUS | Status: AC
  Administered 2014-06-18: 500 [IU] via INTRAVENOUS
  Filled 2014-06-18: qty 5

## 2014-06-18 MED ORDER — SODIUM CHLORIDE 0.9 % IJ SOLN
10.0000 mL | INTRAMUSCULAR | Status: DC | PRN
  Administered 2014-06-18: 10 mL via INTRAVENOUS
  Filled 2014-06-18: qty 10

## 2014-06-18 NOTE — Telephone Encounter (Signed)
Rec'd recommendation from Dr. Oneida Alar to schedule pt. for office evaluation with him, before referring to PCP or General surgery.  Will schedule pt. For OV with Dr. Oneida Alar.

## 2014-06-18 NOTE — Patient Instructions (Signed)

## 2014-06-18 NOTE — Telephone Encounter (Signed)
Returned call to pt.; spoke with wife; reported pt. 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 size of an egg.  Wife reported that the area becomes flat and mushy, when he lays down, and gets more prominent and hard when he stands up.  Reported the "hernia"  Started approx. 1 week ago.  Asking if pt. should be seen by Dr. Oneida Alar, or should he see his PCP?  Will make Dr. Oneida Alar aware and notify pt. of recommendation.

## 2014-06-19 ENCOUNTER — Encounter: Payer: Self-pay | Admitting: Vascular Surgery

## 2014-06-19 ENCOUNTER — Ambulatory Visit (HOSPITAL_COMMUNITY)
Admission: RE | Admit: 2014-06-19 | Discharge: 2014-06-19 | Disposition: A | Discharge status: Home / Self Care | Payer: Medicare Other | Source: Ambulatory Visit | Attending: Internal Medicine | Admitting: Internal Medicine

## 2014-06-19 DIAGNOSIS — R1010 Upper abdominal pain, unspecified: Secondary | ICD-10-CM | POA: Insufficient documentation

## 2014-06-19 DIAGNOSIS — M5489 Other dorsalgia: Secondary | ICD-10-CM

## 2014-06-19 DIAGNOSIS — K7469 Other cirrhosis of liver: Secondary | ICD-10-CM | POA: Insufficient documentation

## 2014-06-19 NOTE — Progress Notes (Signed)
Quick Note:  See my chart, no gallstones noted. Will ask them how his diarrhea is. ______

## 2014-06-20 ENCOUNTER — Encounter: Payer: Self-pay | Admitting: Internal Medicine

## 2014-06-20 ENCOUNTER — Ambulatory Visit (INDEPENDENT_AMBULATORY_CARE_PROVIDER_SITE_OTHER): Payer: Medicare Other | Admitting: Vascular Surgery

## 2014-06-20 ENCOUNTER — Encounter: Payer: Self-pay | Admitting: Vascular Surgery

## 2014-06-20 VITALS — BP 115/85 | HR 107 | Ht 72.0 in | Wt 208.3 lb

## 2014-06-20 DIAGNOSIS — Z48812 Encounter for surgical aftercare following surgery on the circulatory system: Secondary | ICD-10-CM

## 2014-06-20 DIAGNOSIS — M6208 Separation of muscle (nontraumatic), other site: Secondary | ICD-10-CM

## 2014-06-20 DIAGNOSIS — I714 Abdominal aortic aneurysm, without rupture, unspecified: Secondary | ICD-10-CM

## 2014-06-20 NOTE — Progress Notes (Signed)
    History of Present Illness  The patient is a 73 y.o. (1941/01/08) male who presents with chief complaint: "possible incisional hernia". 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 Oneida Alar. He subsequently underwent reexploration of laparotomy and repair of abdominal wall dehiscence by Dr. Trula Slade on 11/10/13. He complains of a right sided "bulge" located inferiorly to the umbilicus. He says it "gurgles" when he presses and is more pronounced when he stands.   Physical Examination  Filed Vitals:   06/20/14 1316  BP: 115/85  Pulse: 107  Height: 6' (1.829 m)  Weight: 208 lb 4.8 oz (94.484 kg)  SpO2: 96%   Body mass index is 28.24 kg/(m^2).  General: A&O x 3, WD overweight male in NAD  Gastrointestinal: soft, non-tender, no distension, obese,  no palpable hernia, no masses,  midline laparotomy scar  Assessment/Plan  The patient is a 73 y.o. 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 Based on physical exam findings, the patient does not have a ventral hernia. He has findings more consistent with diastasis recti. His chest CT on 01/01/14 revealed no proximal hernia. With his past medical history of cirrhosis, he may possibly have ascites. He is scheduled for a chest CT for continued evaluation of his lung malignancy on 07/03/14. He will have a CT of the abdomen at the time for further evaluation.   Virgina Jock, PA-C [facility] [place] Office: [phone] Pager: [phone]  06/20/2014, 1:41 PM   This patient was seen in conjunction with Dr. Oneida Alar.   History and exam findings as above. The patient does have a diastases. I was unable to palpate any hernia defect on exam today. This certainly does not exclude any small area of fascial separation. However I agree with Cama that most likely this represents obesity or accumulation of ascites. He is  scheduled for CT scan of the chest for evaluation of his lung cancer. We will also add on a CT scan of abdomen and pelvis to look for any hernia defects or to exclude ascites as a cause for his abdominal size increase.  Ruta Hinds, MD [facility] [place] Office: [phone] Pager: [phone]

## 2014-06-21 ENCOUNTER — Telehealth: Payer: Self-pay | Admitting: *Deleted

## 2014-06-21 NOTE — Addendum Note (Signed)
Addended by: Mena Goes on: 06/21/2014 04:09 PM   Modules accepted: Orders

## 2014-06-21 NOTE — Telephone Encounter (Signed)
Received a form from [facility] regarding clearance for total hip replacement.  Dr Vista Mink filled out form and faxed back to [phone].

## 2014-06-25 ENCOUNTER — Encounter: Payer: Self-pay | Admitting: Internal Medicine

## 2014-06-26 ENCOUNTER — Telehealth: Payer: Self-pay | Admitting: Cardiology

## 2014-06-26 NOTE — Telephone Encounter (Signed)
Walk in pt Form " Sealed Envelope" gave to [facility]  10.27.15/km

## 2014-06-28 ENCOUNTER — Telehealth: Payer: Self-pay | Admitting: Cardiology

## 2014-06-28 NOTE — Telephone Encounter (Signed)
Received request from Nurse fax box, documents faxed for surgical clearance. To: [employer] number: [phone] Attention: 10.29.15/km

## 2014-07-03 ENCOUNTER — Telehealth: Payer: Self-pay | Admitting: Nurse Practitioner

## 2014-07-03 ENCOUNTER — Other Ambulatory Visit (HOSPITAL_BASED_OUTPATIENT_CLINIC_OR_DEPARTMENT_OTHER): Payer: Medicare Other

## 2014-07-03 ENCOUNTER — Encounter (HOSPITAL_COMMUNITY): Payer: Self-pay

## 2014-07-03 ENCOUNTER — Telehealth: Payer: Self-pay | Admitting: Internal Medicine

## 2014-07-03 ENCOUNTER — Telehealth: Payer: Self-pay | Admitting: *Deleted

## 2014-07-03 ENCOUNTER — Ambulatory Visit (HOSPITAL_COMMUNITY)
Admission: RE | Admit: 2014-07-03 | Discharge: 2014-07-03 | Disposition: A | Discharge status: Home / Self Care | Payer: Medicare Other | Source: Ambulatory Visit | Attending: Internal Medicine | Admitting: Internal Medicine

## 2014-07-03 ENCOUNTER — Telehealth: Payer: Self-pay

## 2014-07-03 DIAGNOSIS — C3410 Malignant neoplasm of upper lobe, unspecified bronchus or lung: Secondary | ICD-10-CM

## 2014-07-03 DIAGNOSIS — R109 Unspecified abdominal pain: Secondary | ICD-10-CM | POA: Diagnosis not present

## 2014-07-03 DIAGNOSIS — I714 Abdominal aortic aneurysm, without rupture, unspecified: Secondary | ICD-10-CM

## 2014-07-03 DIAGNOSIS — Z85118 Personal history of other malignant neoplasm of bronchus and lung: Secondary | ICD-10-CM

## 2014-07-03 DIAGNOSIS — K420 Umbilical hernia with obstruction, without gangrene: Secondary | ICD-10-CM | POA: Diagnosis not present

## 2014-07-03 DIAGNOSIS — K430 Incisional hernia with obstruction, without gangrene: Secondary | ICD-10-CM | POA: Insufficient documentation

## 2014-07-03 DIAGNOSIS — Z48812 Encounter for surgical aftercare following surgery on the circulatory system: Secondary | ICD-10-CM | POA: Insufficient documentation

## 2014-07-03 LAB — CBC WITH DIFFERENTIAL/PLATELET
BASO%: 0.7 % (ref 0.0–2.0)
BASOS ABS: 0 10*3/uL (ref 0.0–0.1)
EOS%: 0.7 % (ref 0.0–7.0)
Eosinophils Absolute: 0.1 10*3/uL (ref 0.0–0.5)
HCT: 46.4 % (ref 38.4–49.9)
HEMOGLOBIN: 15.4 g/dL (ref 13.0–17.1)
LYMPH#: 1.8 10*3/uL (ref 0.9–3.3)
LYMPH%: 25.6 % (ref 14.0–49.0)
MCH: 32.8 pg (ref 27.2–33.4)
MCHC: 33.1 g/dL (ref 32.0–36.0)
MCV: 98.9 fL — AB (ref 79.3–98.0)
MONO#: 0.6 10*3/uL (ref 0.1–0.9)
MONO%: 9 % (ref 0.0–14.0)
NEUT#: 4.6 10*3/uL (ref 1.5–6.5)
NEUT%: 64 % (ref 39.0–75.0)
Platelets: 209 10*3/uL (ref 140–400)
RBC: 4.69 10*6/uL (ref 4.20–5.82)
RDW: 14.5 % (ref 11.0–14.6)
WBC: 7.1 10*3/uL (ref 4.0–10.3)

## 2014-07-03 LAB — COMPREHENSIVE METABOLIC PANEL (CC13)
ALT: 25 U/L (ref 0–55)
ANION GAP: 8 meq/L (ref 3–11)
AST: 29 U/L (ref 5–34)
Albumin: 3.4 g/dL — ABNORMAL LOW (ref 3.5–5.0)
Alkaline Phosphatase: 145 U/L (ref 40–150)
BUN: 19.4 mg/dL (ref 7.0–26.0)
CO2: 25 mEq/L (ref 22–29)
CREATININE: 1 mg/dL (ref 0.7–1.3)
Calcium: 9.3 mg/dL (ref 8.4–10.4)
Chloride: 110 mEq/L — ABNORMAL HIGH (ref 98–109)
Glucose: 96 mg/dl (ref 70–140)
Potassium: 3.9 mEq/L (ref 3.5–5.1)
Sodium: 143 mEq/L (ref 136–145)
Total Bilirubin: 1.33 mg/dL — ABNORMAL HIGH (ref 0.20–1.20)
Total Protein: 7.2 g/dL (ref 6.4–8.3)

## 2014-07-03 MED ORDER — IOHEXOL 350 MG/ML SOLN
100.0000 mL | Freq: Once | INTRAVENOUS | Status: AC | PRN
  Administered 2014-07-03: 100 mL via INTRAVENOUS

## 2014-07-03 NOTE — Telephone Encounter (Signed)
"  STAT call report.  "Incisional hernia containing small bowel.  There is related small bowel obstruction."  Will notify Dr. Julien Nordmann.  Next scheduled f/u is 07-09-2014 at 9:15 am.

## 2014-07-03 NOTE — Telephone Encounter (Signed)
Patient states he had a CT scan for Dr. Julien Nordmann and Dr. Oneida Alar. Dr. Julien Nordmann called him and told him that he needed to be admitted right away for bowel obstruction. Then he got a call from Dr. Oneida Alar telling him to come for OV on Thursday to discuss CT. Patient wants to know what Dr. Carlean Purl thinks he should do. Please, advise.

## 2014-07-03 NOTE — Telephone Encounter (Signed)
Advised patient per Dr Julien Nordmann, he should go to the ER today unless he has a general surgeon he can see in the office today due to CT results.  Patient stated he does not have a general surgeon and that Dr Carlean Purl is supposed to recommend someone. Patient denies any nausea or vomiting but states he has had diarrhea for the past 2 weeks.  While on the phone with the patient he said Dr Oneida Alar office was calling him and he wanted to see what they wanted him to do and he would call us back.

## 2014-07-03 NOTE — Telephone Encounter (Signed)
I called him and advised to keep f/u Dr. Oneida Alar and to go to ED if significant pain, vomiting etc - none 0of which he is having

## 2014-07-03 NOTE — Telephone Encounter (Signed)
PM PAL - moved 11/9 appt to AM - s/w pt he is aware.

## 2014-07-04 ENCOUNTER — Telehealth: Payer: Self-pay

## 2014-07-04 ENCOUNTER — Other Ambulatory Visit: Payer: Self-pay

## 2014-07-04 ENCOUNTER — Inpatient Hospital Stay (HOSPITAL_COMMUNITY)
Admission: EM | Admit: 2014-07-04 | Discharge: 2014-07-19 | DRG: 336 | Disposition: A | Discharge status: Home / Self Care | Payer: Medicare Other | Attending: General Surgery | Admitting: General Surgery

## 2014-07-04 ENCOUNTER — Encounter: Payer: Self-pay | Admitting: Vascular Surgery

## 2014-07-04 ENCOUNTER — Telehealth: Payer: Self-pay | Admitting: Vascular Surgery

## 2014-07-04 ENCOUNTER — Encounter (HOSPITAL_COMMUNITY): Payer: Self-pay | Admitting: *Deleted

## 2014-07-04 DIAGNOSIS — I4891 Unspecified atrial fibrillation: Secondary | ICD-10-CM | POA: Insufficient documentation

## 2014-07-04 DIAGNOSIS — J449 Chronic obstructive pulmonary disease, unspecified: Secondary | ICD-10-CM | POA: Diagnosis present

## 2014-07-04 DIAGNOSIS — K5660 Unspecified intestinal obstruction: Secondary | ICD-10-CM

## 2014-07-04 DIAGNOSIS — N400 Enlarged prostate without lower urinary tract symptoms: Secondary | ICD-10-CM | POA: Diagnosis present

## 2014-07-04 DIAGNOSIS — R5082 Postprocedural fever: Secondary | ICD-10-CM | POA: Diagnosis not present

## 2014-07-04 DIAGNOSIS — K420 Umbilical hernia with obstruction, without gangrene: Principal | ICD-10-CM | POA: Diagnosis present

## 2014-07-04 DIAGNOSIS — D649 Anemia, unspecified: Secondary | ICD-10-CM | POA: Diagnosis not present

## 2014-07-04 DIAGNOSIS — K7460 Unspecified cirrhosis of liver: Secondary | ICD-10-CM | POA: Diagnosis present

## 2014-07-04 DIAGNOSIS — E785 Hyperlipidemia, unspecified: Secondary | ICD-10-CM | POA: Diagnosis present

## 2014-07-04 DIAGNOSIS — I714 Abdominal aortic aneurysm, without rupture, unspecified: Secondary | ICD-10-CM | POA: Diagnosis present

## 2014-07-04 DIAGNOSIS — E876 Hypokalemia: Secondary | ICD-10-CM | POA: Diagnosis not present

## 2014-07-04 DIAGNOSIS — I272 Other secondary pulmonary hypertension: Secondary | ICD-10-CM | POA: Diagnosis present

## 2014-07-04 DIAGNOSIS — Z85118 Personal history of other malignant neoplasm of bronchus and lung: Secondary | ICD-10-CM | POA: Diagnosis not present

## 2014-07-04 DIAGNOSIS — K913 Postprocedural intestinal obstruction: Secondary | ICD-10-CM | POA: Diagnosis not present

## 2014-07-04 DIAGNOSIS — I739 Peripheral vascular disease, unspecified: Secondary | ICD-10-CM | POA: Diagnosis present

## 2014-07-04 DIAGNOSIS — Z7901 Long term (current) use of anticoagulants: Secondary | ICD-10-CM | POA: Diagnosis not present

## 2014-07-04 DIAGNOSIS — Y839 Surgical procedure, unspecified as the cause of abnormal reaction of the patient, or of later complication, without mention of misadventure at the time of the procedure: Secondary | ICD-10-CM | POA: Diagnosis not present

## 2014-07-04 DIAGNOSIS — E860 Dehydration: Secondary | ICD-10-CM | POA: Diagnosis present

## 2014-07-04 DIAGNOSIS — E039 Hypothyroidism, unspecified: Secondary | ICD-10-CM | POA: Diagnosis present

## 2014-07-04 DIAGNOSIS — K565 Intestinal adhesions [bands] with obstruction (postprocedural) (postinfection): Secondary | ICD-10-CM | POA: Diagnosis present

## 2014-07-04 DIAGNOSIS — K9172 Accidental puncture and laceration of a digestive system organ or structure during other procedure: Secondary | ICD-10-CM | POA: Diagnosis not present

## 2014-07-04 DIAGNOSIS — E871 Hypo-osmolality and hyponatremia: Secondary | ICD-10-CM | POA: Diagnosis not present

## 2014-07-04 DIAGNOSIS — I2510 Atherosclerotic heart disease of native coronary artery without angina pectoris: Secondary | ICD-10-CM | POA: Diagnosis present

## 2014-07-04 DIAGNOSIS — J9811 Atelectasis: Secondary | ICD-10-CM | POA: Diagnosis not present

## 2014-07-04 DIAGNOSIS — Z923 Personal history of irradiation: Secondary | ICD-10-CM | POA: Diagnosis not present

## 2014-07-04 DIAGNOSIS — Z87891 Personal history of nicotine dependence: Secondary | ICD-10-CM

## 2014-07-04 DIAGNOSIS — E46 Unspecified protein-calorie malnutrition: Secondary | ICD-10-CM | POA: Diagnosis present

## 2014-07-04 DIAGNOSIS — K56609 Unspecified intestinal obstruction, unspecified as to partial versus complete obstruction: Secondary | ICD-10-CM | POA: Diagnosis present

## 2014-07-04 DIAGNOSIS — C3491 Malignant neoplasm of unspecified part of right bronchus or lung: Secondary | ICD-10-CM | POA: Diagnosis present

## 2014-07-04 DIAGNOSIS — R109 Unspecified abdominal pain: Secondary | ICD-10-CM | POA: Diagnosis present

## 2014-07-04 DIAGNOSIS — N179 Acute kidney failure, unspecified: Secondary | ICD-10-CM | POA: Diagnosis present

## 2014-07-04 DIAGNOSIS — I480 Paroxysmal atrial fibrillation: Secondary | ICD-10-CM | POA: Diagnosis present

## 2014-07-04 DIAGNOSIS — K436 Other and unspecified ventral hernia with obstruction, without gangrene: Secondary | ICD-10-CM

## 2014-07-04 HISTORY — DX: Unspecified atrial fibrillation: I48.91

## 2014-07-04 LAB — COMPREHENSIVE METABOLIC PANEL
ALT: 26 U/L (ref 0–53)
ANION GAP: 20 — AB (ref 5–15)
AST: 33 U/L (ref 0–37)
Albumin: 3.7 g/dL (ref 3.5–5.2)
Alkaline Phosphatase: 161 U/L — ABNORMAL HIGH (ref 39–117)
BUN: 24 mg/dL — AB (ref 6–23)
CO2: 22 mEq/L (ref 19–32)
CREATININE: 1.37 mg/dL — AB (ref 0.50–1.35)
Calcium: 10.1 mg/dL (ref 8.4–10.5)
Chloride: 96 mEq/L (ref 96–112)
GFR calc non Af Amer: 50 mL/min — ABNORMAL LOW (ref >90)
GFR, EST AFRICAN AMERICAN: 58 mL/min — AB (ref >90)
Glucose, Bld: 135 mg/dL — ABNORMAL HIGH (ref 70–99)
POTASSIUM: 3.8 meq/L (ref 3.7–5.3)
Sodium: 138 mEq/L (ref 137–147)
TOTAL PROTEIN: 8 g/dL (ref 6.0–8.3)
Total Bilirubin: 2 mg/dL — ABNORMAL HIGH (ref 0.3–1.2)

## 2014-07-04 LAB — CBC WITH DIFFERENTIAL/PLATELET
Basophils Absolute: 0 10*3/uL (ref 0.0–0.1)
Basophils Relative: 0 % (ref 0–1)
EOS ABS: 0 10*3/uL (ref 0.0–0.7)
EOS PCT: 0 % (ref 0–5)
HCT: 47.7 % (ref 39.0–52.0)
HEMOGLOBIN: 16.8 g/dL (ref 13.0–17.0)
Lymphocytes Relative: 12 % (ref 12–46)
Lymphs Abs: 1.4 10*3/uL (ref 0.7–4.0)
MCH: 33.1 pg (ref 26.0–34.0)
MCHC: 35.2 g/dL (ref 30.0–36.0)
MCV: 93.9 fL (ref 78.0–100.0)
MONO ABS: 0.7 10*3/uL (ref 0.1–1.0)
MONOS PCT: 6 % (ref 3–12)
NEUTROS PCT: 82 % — AB (ref 43–77)
Neutro Abs: 10.3 10*3/uL — ABNORMAL HIGH (ref 1.7–7.7)
Platelets: 220 10*3/uL (ref 150–400)
RBC: 5.08 MIL/uL (ref 4.22–5.81)
RDW: 13.9 % (ref 11.5–15.5)
WBC: 12.5 10*3/uL — ABNORMAL HIGH (ref 4.0–10.5)

## 2014-07-04 LAB — LIPASE, BLOOD: LIPASE: 26 U/L (ref 11–59)

## 2014-07-04 LAB — SAMPLE TO BLOOD BANK

## 2014-07-04 LAB — TROPONIN I: Troponin I: <0.3 ng/mL (ref <0.30)

## 2014-07-04 MED ORDER — ENOXAPARIN SODIUM 100 MG/ML ~~LOC~~ SOLN
1.0000 mg/kg | Freq: Two times a day (BID) | SUBCUTANEOUS | Status: DC
  Administered 2014-07-04: 90 mg via SUBCUTANEOUS
  Filled 2014-07-04 (×3): qty 1

## 2014-07-04 MED ORDER — ONDANSETRON HCL 4 MG/2ML IJ SOLN
4.0000 mg | Freq: Once | INTRAMUSCULAR | Status: AC
  Administered 2014-07-04: 4 mg via INTRAVENOUS
  Filled 2014-07-04: qty 2

## 2014-07-04 MED ORDER — DILTIAZEM HCL 100 MG IV SOLR
10.0000 mg/h | Freq: Once | INTRAVENOUS | Status: AC
  Administered 2014-07-04: 10 mg/h via INTRAVENOUS

## 2014-07-04 MED ORDER — SODIUM CHLORIDE 0.9 % IV BOLUS (SEPSIS)
500.0000 mL | Freq: Once | INTRAVENOUS | Status: AC
  Administered 2014-07-04: 500 mL via INTRAVENOUS

## 2014-07-04 MED ORDER — SODIUM CHLORIDE 0.9 % IV SOLN: INTRAVENOUS | Status: DC

## 2014-07-04 MED ORDER — SODIUM CHLORIDE 0.9 % IV BOLUS (SEPSIS): 1000.0000 mL | Freq: Once | INTRAVENOUS | Status: DC

## 2014-07-04 MED ORDER — DILTIAZEM HCL 25 MG/5ML IV SOLN
10.0000 mg | Freq: Once | INTRAVENOUS | Status: AC
  Administered 2014-07-04: 10 mg via INTRAVENOUS

## 2014-07-04 MED ORDER — SODIUM CHLORIDE 0.9 % IV SOLN
INTRAVENOUS | Status: DC
  Administered 2014-07-04: 100 mL/h via INTRAVENOUS
  Administered 2014-07-05 – 2014-07-07 (×8): via INTRAVENOUS

## 2014-07-04 MED ORDER — DEXTROSE 5 % IV SOLN: 5.0000 mg/h | Freq: Once | INTRAVENOUS | Status: DC

## 2014-07-04 NOTE — H&P (Signed)
PCP:   [name], MD   Chief Complaint:  ABD PAIN  HPI: 73 YO WM with hx lung Ca, CAD. Presents with several hours of N/V. Has had several visits to various drs in the last 3 weeks. He was treated for possible infectious diarrhea and has had eval for both AAA progression and for an incisional hernia. Now he presents with abd pain and 3 episodes of non-bloody emesis. He has an NGT in place now and has been seen by [facility]. In the ER, he was also found to have Afib with RVR and is now on a cardizem drip He was seen by Surgery and had a CT "office yesterday and a CT scan was done demonstrating a loop of small bowel in a chronically incarcerated ventral hernia, but today started having nausea and vomiting." they note "Assessment/Plan:SBO from chronically incarcerated ventral hernia History of previous repair with mesh No bowel is felt to be at ischemic risk The patient has noted a "swelling and reduction" of his abdomen over the past few days. His last PO intake was several hours ago. He denies F/c/s. His abd pain is improved with the NGT. He has no CP or SOB and did not feel the fast HR.  His weight has been stable. His port is still in place but his last CA tx was 2+yrs ago.  He has chronic knee pain and has been awaiting replacement   Review of Systems:  Review of Systems - see above Past Medical History: Past Medical History  Diagnosis Date  . History of colon polyps 2006,2008  . 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 Hyperlipidemia     takes Pravastatin  . Emphysema   . Enlarged prostate     takes Rapaflo daily  . Arthritis     left knee and back arthrtis  . Hypothyroidism     takes Synthroid daily  . Cataract   . Hx of radiation therapy 09/02/11 to 10/19/11    chest  . Diverticulosis   . Cirrhosis     By radiography, seen on CT  . Rosacea   . CAD (coronary artery disease)   . Shortness of breath   . Heart murmur     teen  . Lung cancer     right upper lobe   Past Surgical  History  Procedure Laterality Date  . Hernia repair  70's  . Vasectomy  70's  . Finger surgery  2008    left pointer finger  . Knee arthroscopy  2008    left knee  . Tonsillectomy      as a child  . Colonoscopy w/ biopsies      multiple   . Cardiac catheterization  2003  . Esophagogastroduodenoscopy  2006  . Portacath placement  08/10/2011    Procedure: INSERTION PORT-A-CATH;  Surgeon: Pierre Bali, MD;  Location: [facility];  Service: Thoracic;  Laterality: Left;  Left Subclavian  . Abdominal aortic aneurysm repair N/A 10/31/2013    Procedure: aorto to left external iliac and right external iliac and left internal iliac, reimplantation of inferior mesenteric artery and ligation of left iliac artery aneursym;  Surgeon: Elam Dutch, MD;  Location: [facility];  Service: Vascular;  Laterality: N/A;  . Thrombectomy iliac artery Bilateral 10/31/2013    Procedure: THROMBECTOMY ILIAC ARTERY;  Surgeon: Elam Dutch, MD;  Location: [facility];  Service: Vascular;  Laterality: Bilateral;  . Abdominal wound dehiscence N/A 11/10/2013    Procedure: ABDOMINAL  WOUND CLOSURE ;  Surgeon: Serafina Mitchell, MD;  Location: [facility] OR;  Service: Vascular;  Laterality: N/A;    Medications: Prior to Admission medications   Medication Sig Start Date End Date Taking? Authorizing Provider  doxycycline (VIBRAMYCIN) 100 MG capsule Take 100 mg by mouth 2 (two) times daily.   Yes Historical Provider, MD  furosemide (LASIX) 20 MG tablet Take 1 tablet (20 mg total) by mouth daily. 01/16/14  Yes Dorothy Spark, MD  Lactobacillus (DIGESTIVE HEALTH PROBIOTIC) CAPS Take 1 capsule by mouth daily. "30 million cultures"   Yes Historical Provider, MD  lidocaine-prilocaine (EMLA) cream Apply 1 application topically as needed.   08/06/11  Yes Curt Bears, MD  MILK THISTLE PO Take 1 capsule by mouth daily. Take one 1200 mg capsule daily   Yes Historical Provider, MD  Multiple Vitamins-Minerals (MULTIVITAMINS THER. W/MINERALS) TABS  Take 1 tablet by mouth daily.     Yes Historical Provider, MD  nystatin-triamcinolone (MYCOLOG II) cream Apply 1 application topically daily as needed. Rash 07/10/11  Yes Historical Provider, MD  SYNTHROID 175 MCG tablet Take 175 mcg by mouth daily.  06/08/11  Yes Stephens Shire, MD  tiotropium (SPIRIVA) 18 MCG inhalation capsule Place 18 mcg into inhaler and inhale daily.   Yes Historical Provider, MD  HYDROcodone-acetaminophen (NORCO/VICODIN) 5-325 MG per tablet Take 1 tablet by mouth every 6 (six) hours as needed. 11/10/13   Samantha J Rhyne, PA-C  metroNIDAZOLE (FLAGYL) 250 MG tablet Take 1 tablet (250 mg total) by mouth 3 (three) times daily. 06/15/14   Gatha Mayer, MD  metroNIDAZOLE (METROGEL) 0.75 % gel Apply 1 application topically 2 (two) times daily.    Historical Provider, MD  OVER THE COUNTER MEDICATION 30 mg 3 (three) times daily. LUNG SUPPORT    Historical Provider, MD  oxyCODONE-acetaminophen (PERCOCET/ROXICET) 5-325 MG per tablet Take 1 tablet by mouth every 4 (four) hours as needed for severe pain. 02/01/14   Elam Dutch, MD  traMADol (ULTRAM) 50 MG tablet Take 50 mg by mouth every 6 (six) hours as needed.    Historical Provider, MD    Allergies:  No Known Allergies  Social History:  reports that he quit smoking about 3 years ago. His smoking use included Cigarettes. He has a 60 pack-year smoking history. He has quit using smokeless tobacco. He reports that he drinks alcohol. He reports that he does not use illicit drugs.  Family History: Family History  Problem Relation Age of Onset  . Anesthesia problems Neg Hx   . Hypotension Neg Hx   . Malignant hyperthermia Neg Hx   . Pseudochol deficiency Neg Hx   . Cancer Father   . Diabetes Father   . Hyperlipidemia Brother   . AAA (abdominal aortic aneurysm) Brother   . Hyperlipidemia Mother   . Hyperlipidemia Sister     Physical Exam: Filed Vitals:   07/04/14 2010 07/04/14 2020 07/04/14 2030 07/04/14 2100  BP: 102/76  109/84 106/72 97/67  Pulse: 84 140 77 114  Temp:      Resp: 17 12 11 21   Height:      Weight:      SpO2: 92% 88% 93% 93%   General appearance: WM sitting up in no distress. NGT in place, aniciteric, no nystagms  Neck: no adenopathy, no carotid bruit, no JVD and thyroid not enlarged, symmetric, no tenderness/mass/nodules Resp: distant clear with no wheeze Cardio: rapid IR IR with murmur GI: healed midline scar. Reduced BS"s. Sl tender  Extremities:fair pulses, no edema Lymph nodes: Cervical adenopathy: no cervical lymphadenopathy Neurologic:awake with clear speech, mentation intact, no tremor  Labs on Admission:   Recent Labs  07/03/14 1024 07/04/14 1858  NA 143 138  K 3.9 3.8  CL  --  96  CO2 25 22  GLUCOSE 96 135*  BUN 19.4 24*  CREATININE 1.0 1.37*  CALCIUM 9.3 10.1    Recent Labs  07/03/14 1024 07/04/14 1858  AST 29 33  ALT 25 26  ALKPHOS 145 161*  BILITOT 1.33* 2.0*  PROT 7.2 8.0  ALBUMIN 3.4* 3.7    Recent Labs  07/04/14 1858  LIPASE 26    Recent Labs  07/03/14 1024 07/04/14 1858  WBC 7.1 12.5*  NEUTROABS 4.6 10.3*  HGB 15.4 16.8  HCT 46.4 47.7  MCV 98.9* 93.9  PLT 209 220    Recent Labs  07/04/14 1858  TROPONINI <0.30       Radiological Exams on Admission: Ct Chest W Contrast  07/03/2014   CLINICAL DATA:  CHEST CT ORDERED FOR RESTAGING LUNG CANCER BY DR. Julien Nordmann.  CTA OF THE ABDOMEN AND PELVIS ORDERED BY DR. Oneida Alar FOR TRIPLE A REPAIR FOLLOW-UP.  EXAM: CT CHEST WITH CONTRAST  CT ABDOMEN AND PELVIS ANGIOGRAPHY WITH AND WITHOUT CONTRAST  TECHNIQUE: Multidetector CT imaging of the chest was performed during intravenous contrast administration. Multidetector CT imaging of the abdomen and pelvis was performed following the standard protocol before and during bolus administration of intravenous contrast.  CONTRAST:  135mL OMNIPAQUE IOHEXOL 350 MG/ML SOLN  COMPARISON:  Abdominal CT 09/05/2013.  Chest CT 01/01/2014.  FINDINGS: CT CHEST FINDINGS   THORACIC INLET/BODY WALL:  Porta catheter from the left in stable position.  Atrophic or resected thyroid gland. No supraclavicular or axillary adenopathy.  MEDIASTINUM:  No cardiomegaly or pericardial effusion. Diffuse atherosclerosis, including the coronary arteries. No acute vascular findings.  No suspicious or enlarging mediastinal lymph nodes. Previously measured right juxta diaphragmatic node is smaller at 6 mm short axis. A upper prevascular node is stable at 6 mm short axis.  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 No soft tissue increase or nodular component suggestive of recurrent disease. Stable subpleural nodule along the right minor fissure measuring 9 mm. No new pulmonary nodule.  Panlobular and centrilobular emphysema. No pneumonia, edema, effusion, or pneumothorax.  UPPER ABDOMEN:  See below  OSSEOUS:  No acute fracture.  No suspicious lytic or blastic lesions.  CT ABDOMEN AND PELVIS FINDINGS  BODY WALL: There is an incisional hernia just above the umbilicus which contains small bowel and results in a partial small bowel obstruction.  Fatty right inguinal hernia which is small.  Liver: No focal abnormality.  Biliary: No evidence of biliary obstruction or stone.  Pancreas: Unremarkable.  Spleen: Unremarkable.  Adrenals: Unremarkable.  Kidneys and ureters: No hydronephrosis or stone.  Bladder: Unremarkable.  Reproductive: Enlarged prostate, deforming the bladder base. Morphology is stable from previous.  Bowel: There is dilated proximal small bowel leading to a transition point within the above described hernia sac. Some fluid is present within the relatively decompressed distal small bowel, and the colon is not emptied, compatible with partial obstruction. No evidence of bowel devascularization/necrosis. Negative appendix. Extensive distal colonic diverticulosis without active inflammation.  Retroperitoneum: No  mass or adenopathy.  Peritoneum: No ascites or pneumoperitoneum.  Vascular: Aortic branching is standard. There is a notable early bifurcation of the left renal artery serving the  left lower pole.  Status post bypass of an abdominal aortic aneurysm to the bilateral external iliacs and left internal iliac arteries. The graft is widely patent. Reimplanted IMA is widely patent. Branches of the left hypogastric artery are patent. The new maximal infrarenal aortic diameter measures 31 mm. The new maximal left common iliac artery diameter is 23 mm. The new maximal right common iliac artery diameter is 19 mm. These diameters include the collapsed aneurysm sac wall. The distal left high epigastric artery measures 15 mm in maximal diameter. No concerning periaortic fat infiltration or gas.  OSSEOUS: No acute abnormalities.  These results will be called to the ordering clinician or representative by the Radiologist Assistant, and communication documented in the PACS or zVision Dashboard.  IMPRESSION: 1. Incisional hernia containing small bowel. There is related partial small bowel obstruction. 2. No evidence of recurrent lung cancer. 3. Status post aortic and iliac aneurysm bypass. New baseline anatomy is described above.   Electronically Signed   By: Jorje Guild M.D.   On: 07/03/2014 14:18   US Abdomen Complete  06/19/2014   CLINICAL DATA:  Upper abdominal pain.  Cirrhosis of the liver.  EXAM: ULTRASOUND ABDOMEN COMPLETE  COMPARISON:  09/05/2013  FINDINGS: Gallbladder: No gallstones or wall thickening visualized. No sonographic Murphy sign noted.  Common bile duct: Diameter: Normal caliber, 4 mm  Liver: Coarsened echotexture with nodular contours compatible with cirrhosis. No focal abnormality.  IVC: No abnormality visualized.  Pancreas: Visualized portion unremarkable.  Spleen: Size and appearance within normal limits.  Right Kidney: Length: 11.8 cm. Echogenicity within normal limits. No mass or hydronephrosis  visualized.  Left Kidney: Length: 12.4 cm. Echogenicity within normal limits. No mass or hydronephrosis visualized.  Abdominal aorta: No aneurysm visualized.  Other findings: None.  IMPRESSION: Changes of cirrhosis. No focal hepatic abnormality. No acute findings.   Electronically Signed   By: Rolm Baptise M.D.   On: 06/19/2014 08:25   Ct Angio Abd/pel W/ And/or W/o  07/03/2014   CLINICAL DATA:  CHEST CT ORDERED FOR RESTAGING LUNG CANCER BY DR. Julien Nordmann.  CTA OF THE ABDOMEN AND PELVIS ORDERED BY DR. Oneida Alar FOR TRIPLE A REPAIR FOLLOW-UP.  EXAM: CT CHEST WITH CONTRAST  CT ABDOMEN AND PELVIS ANGIOGRAPHY WITH AND WITHOUT CONTRAST  TECHNIQUE: Multidetector CT imaging of the chest was performed during intravenous contrast administration. Multidetector CT imaging of the abdomen and pelvis was performed following the standard protocol before and during bolus administration of intravenous contrast.  CONTRAST:  175mL OMNIPAQUE IOHEXOL 350 MG/ML SOLN  COMPARISON:  Abdominal CT 09/05/2013.  Chest CT 01/01/2014.  FINDINGS: CT CHEST FINDINGS  THORACIC INLET/BODY WALL:  Porta catheter from the left in stable position.  Atrophic or resected thyroid gland. No supraclavicular or axillary adenopathy.  MEDIASTINUM:  No cardiomegaly or pericardial effusion. Diffuse atherosclerosis, including the coronary arteries. No acute vascular findings.  No suspicious or enlarging mediastinal lymph nodes. Previously measured right juxta diaphragmatic node is smaller at 6 mm short axis. A upper prevascular node is stable at 6 mm short axis.  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 No soft tissue increase or nodular component suggestive of recurrent disease. Stable subpleural nodule along the right minor fissure measuring 9 mm. No new pulmonary nodule.  Panlobular and centrilobular emphysema. No pneumonia, edema, effusion, or pneumothorax.  UPPER ABDOMEN:  See  below  OSSEOUS:  No acute fracture.  No suspicious lytic or blastic lesions.  CT ABDOMEN AND PELVIS FINDINGS  BODY WALL: There is an incisional hernia just above the umbilicus which contains small bowel and results in a partial small bowel obstruction.  Fatty right inguinal hernia which is small.  Liver: No focal abnormality.  Biliary: No evidence of biliary obstruction or stone.  Pancreas: Unremarkable.  Spleen: Unremarkable.  Adrenals: Unremarkable.  Kidneys and ureters: No hydronephrosis or stone.  Bladder: Unremarkable.  Reproductive: Enlarged prostate, deforming the bladder base. Morphology is stable from previous.  Bowel: There is dilated proximal small bowel leading to a transition point within the above described hernia sac. Some fluid is present within the relatively decompressed distal small bowel, and the colon is not emptied, compatible with partial obstruction. No evidence of bowel devascularization/necrosis. Negative appendix. Extensive distal colonic diverticulosis without active inflammation.  Retroperitoneum: No mass or adenopathy.  Peritoneum: No ascites or pneumoperitoneum.  Vascular: Aortic branching is standard. 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  Status post bypass of an abdominal aortic aneurysm to the bilateral external iliacs and left internal iliac arteries. The graft is widely patent. Reimplanted IMA is widely patent. Branches of the left hypogastric artery are patent. The new maximal infrarenal aortic diameter measures 31 mm. The new maximal left common iliac artery diameter is 23 mm. The new maximal right common iliac artery diameter is 19 mm. These diameters include the collapsed aneurysm sac wall. The distal left high epigastric artery measures 15 mm in maximal diameter. No concerning periaortic fat infiltration or gas.  OSSEOUS: No acute abnormalities.  These results will be called to the ordering clinician or representative by  the Radiologist Assistant, and communication documented in the PACS or zVision Dashboard.  IMPRESSION: 1. Incisional hernia containing small bowel. There is related partial small bowel obstruction. 2. No evidence of recurrent lung cancer. 3. Status post aortic and iliac aneurysm bypass. New baseline anatomy is described above.   Electronically Signed   By: Jorje Guild M.D.   On: 07/03/2014 14:18   Orders placed or performed in visit on 07/04/14  . EKG 12-Lead: Atrial fibrillation with rapid V-rate Paired ventricular premature complexes Anteroseptal infarct, old ST depression, probably rate related   ECHO in FEB: Left ventricle: The cavity size was normal. Systolic function was normal. The estimated ejection fraction was in the range of 55% to 60%. Wall motion was normal; there were no regional wall motion abnormalities. There was an increased relative contribution of atrial contraction to ventricular filling. Doppler parameters are consistent with abnormal left ventricular relaxation (grade 1 diastolic dysfunction). - Aortic valve: Moderate diffuse thickening and calcification, consistent with sclerosis. Trivial regurgitation. - Pulmonary arteries: PA peak pressure: 42mm Hg (S). Impressions:  - The right ventricular systolic pressure was increased consistent with mild pulmonary hypertension. Assessment/Plan Active Problems:   Atrial fibrillation with rapid ventricular response: on Cardiazem drip. Doing better with regard to rate. Start lovenox. Call cards. Troponin and lytes OK SMALL BOWEL OBSTRUCTION: NGT in place. Doubt ischemic bowel.  [facility]urg on board HYPOTHYROID: hold Rx for tonight CIRRHOSIS: LFT's up some, not sure what his baseline is LUNG CANCER: NED by scans, hx XRT AKI: will hydrate COPD/EMPHYSEMA: sats OK ELEVATED BILIRUBIN: see above AAA: stable on Scan PVD WITH BYPASS:" see scan report above  Kimetha Trulson ALAN 07/04/2014, 9:15 PM

## 2014-07-04 NOTE — ED Provider Notes (Signed)
CSN: [id number]     Arrival date & time 07/04/14  1824 History   First MD Initiated Contact with Patient 07/04/14 1915     Chief Complaint  Patient presents with  . Abdominal Pain     (Consider location/radiation/quality/duration/timing/severity/associated sxs/prior Treatment) HPI Comments: Patient here complaining of diffuse abdominal pain with dark emesis 1 day. A CT scan yesterday which demonstrated an incarcerated ventral hernia causing a partial small bowel obstruction. Denies any fever or chills. No chest pain or shortness of breath. No diarrhea noted. Patient was scheduled to see a general surgeon tomorrow but due to his symptoms came in today. He does note some dizziness when standing. States he feels as if he is dehydrated. Symptoms persistent and nothing makes them better. No treatment use prior to arrival  Patient is a 73 y.o. male presenting with abdominal pain. The history is provided by the patient and the spouse.  Abdominal Pain   Past Medical History  Diagnosis Date  . History of colon polyps 2006,2008  . 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 Hyperlipidemia     takes Pravastatin  . Emphysema   . Enlarged prostate     takes Rapaflo daily  . Arthritis     left knee and back arthrtis  . Hypothyroidism     takes Synthroid daily  . Cataract   . Hx of radiation therapy 09/02/11 to 10/19/11    chest  . Diverticulosis   . Cirrhosis     By radiography, seen on CT  . Rosacea   . CAD (coronary artery disease)   . Shortness of breath   . Heart murmur     teen  . Lung cancer     right upper lobe   Past Surgical History  Procedure Laterality Date  . Hernia repair  70's  . Vasectomy  70's  . Finger surgery  2008    left pointer finger  . Knee arthroscopy  2008    left knee  . Tonsillectomy      as a child  . Colonoscopy w/ biopsies      multiple   . Cardiac catheterization  2003  . Esophagogastroduodenoscopy  2006  . Portacath placement  08/10/2011    Procedure:  INSERTION PORT-A-CATH;  Surgeon: Pierre Bali, MD;  Location: [facility];  Service: Thoracic;  Laterality: Left;  Left Subclavian  . Abdominal aortic aneurysm repair N/A 10/31/2013    Procedure: aorto to left external iliac and right external iliac and left internal iliac, reimplantation of inferior mesenteric artery and ligation of left iliac artery aneursym;  Surgeon: Elam Dutch, MD;  Location: [facility];  Service: Vascular;  Laterality: N/A;  . Thrombectomy iliac artery Bilateral 10/31/2013    Procedure: THROMBECTOMY ILIAC ARTERY;  Surgeon: Elam Dutch, MD;  Location: [facility];  Service: Vascular;  Laterality: Bilateral;  . Abdominal wound dehiscence N/A 11/10/2013    Procedure: ABDOMINAL WOUND CLOSURE ;  Surgeon: Serafina Mitchell, MD;  Location: [facility] OR;  Service: Vascular;  Laterality: N/A;   Family History  Problem Relation Age of Onset  . Anesthesia problems Neg Hx   . Hypotension Neg Hx   . Malignant hyperthermia Neg Hx   . Pseudochol deficiency Neg Hx   . Cancer Father   . Diabetes Father   . Hyperlipidemia Brother   . AAA (abdominal aortic aneurysm) Brother   . Hyperlipidemia Mother   . Hyperlipidemia Sister    History  Substance Use Topics  .  Smoking status: Former Smoker -- 1.50 packs/day for 40 years    Types: Cigarettes    Quit date: 07/02/2011  . Smokeless tobacco: Former Systems developer  . Alcohol Use: 0.0 oz/week    0-2 Cans of beer per week     Comment: social    Review of Systems  Gastrointestinal: Positive for abdominal pain.  All other systems reviewed and are negative.     Allergies  Review of patient's allergies indicates no known allergies.  Home Medications   Prior to Admission medications   Medication Sig Start Date End Date Taking? Authorizing Provider  doxycycline (VIBRAMYCIN) 100 MG capsule Take 100 mg by mouth 2 (two) times daily.    Historical Provider, MD  furosemide (LASIX) 20 MG tablet Take 1 tablet (20 mg total) by mouth daily. 01/16/14   Dorothy Spark, MD  HYDROcodone-acetaminophen (NORCO/VICODIN) 5-325 MG per tablet Take 1 tablet by mouth every 6 (six) hours as needed. 11/10/13   Samantha J Rhyne, PA-C  lidocaine-prilocaine (EMLA) cream Apply 1 application topically as needed.   08/06/11   Curt Bears, MD  metroNIDAZOLE (FLAGYL) 250 MG tablet Take 1 tablet (250 mg total) by mouth 3 (three) times daily. 06/15/14   Gatha Mayer, MD  metroNIDAZOLE (METROGEL) 0.75 % gel Apply 1 application topically 2 (two) times daily.    Historical Provider, MD  MILK THISTLE PO Take 1 capsule by mouth daily. Take one 1200 mg capsule daily    Historical Provider, MD  Multiple Vitamins-Minerals (MULTIVITAMINS THER. W/MINERALS) TABS Take 1 tablet by mouth daily.      Historical Provider, MD  nystatin-triamcinolone (MYCOLOG II) cream Apply 1 application topically daily as needed. Rash 07/10/11   Historical Provider, MD  OVER THE COUNTER MEDICATION 30 mg 3 (three) times daily. LUNG SUPPORT    Historical Provider, MD  oxyCODONE-acetaminophen (PERCOCET/ROXICET) 5-325 MG per tablet Take 1 tablet by mouth every 4 (four) hours as needed for severe pain. 02/01/14   Elam Dutch, MD  SYNTHROID 175 MCG tablet Take 175 mcg by mouth daily.  06/08/11   Stephens Shire, MD  tiotropium (SPIRIVA) 18 MCG inhalation capsule Place 18 mcg into inhaler and inhale daily.    Historical Provider, MD  traMADol (ULTRAM) 50 MG tablet Take 50 mg by mouth every 6 (six) hours as needed.    Historical Provider, MD   BP 86/54 mmHg  Pulse 96  Temp(Src) 98 F (36.7 C)  Resp 16  Ht 6' (1.829 m)  Wt 200 lb (90.719 kg)  BMI 27.12 kg/m2  SpO2 94% Physical Exam  Constitutional: He is oriented to person, place, and time. He appears well-developed and well-nourished.  Non-toxic appearance. No distress.  HENT:  Head: Normocephalic and atraumatic.  Eyes: Conjunctivae, EOM and lids are normal. Pupils are equal, round, and reactive to light.  Neck: Normal range of motion. Neck supple. No  tracheal deviation present. No thyroid mass present.  Cardiovascular: Normal heart sounds.  An irregularly irregular rhythm present. Tachycardia present.  Exam reveals no gallop.   No murmur heard. Pulmonary/Chest: Effort normal and breath sounds normal. No stridor. No respiratory distress. He has no decreased breath sounds. He has no wheezes. He has no rhonchi. He has no rales.  Abdominal: Soft. Normal appearance and bowel sounds are normal. He exhibits no distension. There is generalized tenderness. There is no rebound and no CVA tenderness.  Musculoskeletal: Normal range of motion. He exhibits no edema or tenderness.  Neurological: He is alert and  oriented to person, place, and time. He has normal strength. No cranial nerve deficit or sensory deficit. GCS eye subscore is 4. GCS verbal subscore is 5. GCS motor subscore is 6.  Skin: Skin is warm and dry. No abrasion and no rash noted.  Psychiatric: He has a normal mood and affect. His speech is normal and behavior is normal.  Nursing note and vitals reviewed.   ED Course  Procedures (including critical care time) Labs Review Labs Reviewed  CBC WITH DIFFERENTIAL - Abnormal; Notable for the following:    WBC 12.5 (*)    Neutrophils Relative % 82 (*)    Neutro Abs 10.3 (*)    All other components within normal limits  COMPREHENSIVE METABOLIC PANEL - Abnormal; Notable for the following:    Glucose, Bld 135 (*)    BUN 24 (*)    Creatinine, Ser 1.37 (*)    Alkaline Phosphatase 161 (*)    Total Bilirubin 2.0 (*)    GFR calc non Af Amer 50 (*)    GFR calc Af Amer 58 (*)    Anion gap 20 (*)    All other components within normal limits  LIPASE, BLOOD  URINALYSIS, ROUTINE W REFLEX MICROSCOPIC  TROPONIN I  SAMPLE TO BLOOD BANK    Imaging Review Ct Chest W Contrast  07/03/2014   CLINICAL DATA:  CHEST CT ORDERED FOR RESTAGING LUNG CANCER BY DR. Julien Nordmann.  CTA OF THE ABDOMEN AND PELVIS ORDERED BY DR. Oneida Alar FOR TRIPLE A REPAIR FOLLOW-UP.   EXAM: CT CHEST WITH CONTRAST  CT ABDOMEN AND PELVIS ANGIOGRAPHY WITH AND WITHOUT CONTRAST  TECHNIQUE: Multidetector CT imaging of the chest was performed during intravenous contrast administration. Multidetector CT imaging of the abdomen and pelvis was performed following the standard protocol before and during bolus administration of intravenous contrast.  CONTRAST:  138mL OMNIPAQUE IOHEXOL 350 MG/ML SOLN  COMPARISON:  Abdominal CT 09/05/2013.  Chest CT 01/01/2014.  FINDINGS: CT CHEST FINDINGS  THORACIC INLET/BODY WALL:  Porta catheter from the left in stable position.  Atrophic or resected thyroid gland. No supraclavicular or axillary adenopathy.  MEDIASTINUM:  No cardiomegaly or pericardial effusion. Diffuse atherosclerosis, including the coronary arteries. No acute vascular findings.  No suspicious or enlarging mediastinal lymph nodes. Previously measured right juxta diaphragmatic node is smaller at 6 mm short axis. A upper prevascular node is stable at 6 mm short axis.  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 No soft tissue increase or nodular component suggestive of recurrent disease. Stable subpleural nodule along the right minor fissure measuring 9 mm. No new pulmonary nodule.  Panlobular and centrilobular emphysema. No pneumonia, edema, effusion, or pneumothorax.  UPPER ABDOMEN:  See below  OSSEOUS:  No acute fracture.  No suspicious lytic or blastic lesions.  CT ABDOMEN AND PELVIS FINDINGS  BODY WALL: There is an incisional hernia just above the umbilicus which contains small bowel and results in a partial small bowel obstruction.  Fatty right inguinal hernia which is small.  Liver: No focal abnormality.  Biliary: No evidence of biliary obstruction or stone.  Pancreas: Unremarkable.  Spleen: Unremarkable.  Adrenals: Unremarkable.  Kidneys and ureters: No hydronephrosis or stone.  Bladder: Unremarkable.  Reproductive:  Enlarged prostate, deforming the bladder base. Morphology is stable from previous.  Bowel: There is dilated proximal small bowel leading to a transition point within the above described hernia sac. Some fluid is present within the relatively decompressed distal  small bowel, and the colon is not emptied, compatible with partial obstruction. No evidence of bowel devascularization/necrosis. Negative appendix. Extensive distal colonic diverticulosis without active inflammation.  Retroperitoneum: No mass or adenopathy.  Peritoneum: No ascites or pneumoperitoneum.  Vascular: Aortic branching is standard. 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  Status post bypass of an abdominal aortic aneurysm to the bilateral external iliacs and left internal iliac arteries. The graft is widely patent. Reimplanted IMA is widely patent. Branches of the left hypogastric artery are patent. The new maximal infrarenal aortic diameter measures 31 mm. The new maximal left common iliac artery diameter is 23 mm. The new maximal right common iliac artery diameter is 19 mm. These diameters include the collapsed aneurysm sac wall. The distal left high epigastric artery measures 15 mm in maximal diameter. No concerning periaortic fat infiltration or gas.  OSSEOUS: No acute abnormalities.  These results will be called to the ordering clinician or representative by the Radiologist Assistant, and communication documented in the PACS or zVision Dashboard.  IMPRESSION: 1. Incisional hernia containing small bowel. There is related partial small bowel obstruction. 2. No evidence of recurrent lung cancer. 3. Status post aortic and iliac aneurysm bypass. New baseline anatomy is described above.   Electronically Signed   By: Jorje Guild M.D.   On: 07/03/2014 14:18   Ct Angio Abd/pel W/ And/or W/o  07/03/2014   CLINICAL DATA:  CHEST CT ORDERED FOR RESTAGING LUNG CANCER BY DR. Julien Nordmann.  CTA OF THE  ABDOMEN AND PELVIS ORDERED BY DR. Oneida Alar FOR TRIPLE A REPAIR FOLLOW-UP.  EXAM: CT CHEST WITH CONTRAST  CT ABDOMEN AND PELVIS ANGIOGRAPHY WITH AND WITHOUT CONTRAST  TECHNIQUE: Multidetector CT imaging of the chest was performed during intravenous contrast administration. Multidetector CT imaging of the abdomen and pelvis was performed following the standard protocol before and during bolus administration of intravenous contrast.  CONTRAST:  145mL OMNIPAQUE IOHEXOL 350 MG/ML SOLN  COMPARISON:  Abdominal CT 09/05/2013.  Chest CT 01/01/2014.  FINDINGS: CT CHEST FINDINGS  THORACIC INLET/BODY WALL:  Porta catheter from the left in stable position.  Atrophic or resected thyroid gland. No supraclavicular or axillary adenopathy.  MEDIASTINUM:  No cardiomegaly or pericardial effusion. Diffuse atherosclerosis, including the coronary arteries. No acute vascular findings.  No suspicious or enlarging mediastinal lymph nodes. Previously measured right juxta diaphragmatic node is smaller at 6 mm short axis. A upper prevascular node is stable at 6 mm short axis.  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 No soft tissue increase or nodular component suggestive of recurrent disease. Stable subpleural nodule along the right minor fissure measuring 9 mm. No new pulmonary nodule.  Panlobular and centrilobular emphysema. No pneumonia, edema, effusion, or pneumothorax.  UPPER ABDOMEN:  See below  OSSEOUS:  No acute fracture.  No suspicious lytic or blastic lesions.  CT ABDOMEN AND PELVIS FINDINGS  BODY WALL: There is an incisional hernia just above the umbilicus which contains small bowel and results in a partial small bowel obstruction.  Fatty right inguinal hernia which is small.  Liver: No focal abnormality.  Biliary: No evidence of biliary obstruction or stone.  Pancreas: Unremarkable.  Spleen: Unremarkable.  Adrenals: Unremarkable.  Kidneys and  ureters: No hydronephrosis or stone.  Bladder: Unremarkable.  Reproductive: Enlarged prostate, deforming the bladder base. Morphology is stable from previous.  Bowel: There is dilated proximal small bowel leading to  a transition point within the above described hernia sac. Some fluid is present within the relatively decompressed distal small bowel, and the colon is not emptied, compatible with partial obstruction. No evidence of bowel devascularization/necrosis. Negative appendix. Extensive distal colonic diverticulosis without active inflammation.  Retroperitoneum: No mass or adenopathy.  Peritoneum: No ascites or pneumoperitoneum.  Vascular: Aortic branching is standard. 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  Status post bypass of an abdominal aortic aneurysm to the bilateral external iliacs and left internal iliac arteries. The graft is widely patent. Reimplanted IMA is widely patent. Branches of the left hypogastric artery are patent. The new maximal infrarenal aortic diameter measures 31 mm. The new maximal left common iliac artery diameter is 23 mm. The new maximal right common iliac artery diameter is 19 mm. These diameters include the collapsed aneurysm sac wall. The distal left high epigastric artery measures 15 mm in maximal diameter. No concerning periaortic fat infiltration or gas.  OSSEOUS: No acute abnormalities.  These results will be called to the ordering clinician or representative by the Radiologist Assistant, and communication documented in the PACS or zVision Dashboard.  IMPRESSION: 1. Incisional hernia containing small bowel. There is related partial small bowel obstruction. 2. No evidence of recurrent lung cancer. 3. Status post aortic and iliac aneurysm bypass. New baseline anatomy is described above.   Electronically Signed   By: Jorje Guild M.D.   On: 07/03/2014 14:18     EKG Interpretation None      MDM   Final diagnoses:   None    Date: 07/04/2014  Rate: 178  Rhythm: atrial fibrillation  QRS Axis: normal  Intervals: normal  ST/T Wave abnormalities: nonspecific ST changes  Conduction Disutrbances:none  Narrative Interpretation:   Old EKG Reviewed: none available   Patient with new onset atrial fibrillation with RVR. Blood pressure noted at first which is hypotensive and responded to fluids. Started on Cardizem drip. NG tube has been ordered. Discuss case with Dr. Kellie Simmering from vascular surgery who has seen the patient. He recommends patient be seen by general surgery and have spoken with Dr. Hulen Skains will come to admit the patient  8:37 PM Patient seen by Dr. Hulen Skains and requested patient be admitted to the medicine service. I gave the patient. Bolus of Cardizem and heart rate improved. I re-bolused and began with an additional 10 mg of IV Cardizem. And increased his Cardizem drip. Blood pressure is stable. Spoke with Dr. Forde Dandy, he will come to admit  CRITICAL CARE Performed by: Leota Jacobsen Total critical care time: 60 Critical care time was exclusive of separately billable procedures and treating other patients. Critical care was necessary to treat or prevent imminent or life-threatening deterioration. Critical care was time spent personally by me on the following activities: development of treatment plan with patient and/or surrogate as well as nursing, discussions with consultants, evaluation of patient's response to treatment, examination of patient, obtaining history from patient or surrogate, ordering and performing treatments and interventions, ordering and review of laboratory studies, ordering and review of radiographic studies, pulse oximetry and re-evaluation of patient's condition.      Leota Jacobsen, MD 07/04/14 2038

## 2014-07-04 NOTE — ED Notes (Signed)
The pt has lunbg cancer and a recent aaa repair.  He had a c-t scan performed today and he has a small bowel obstruction attached to his hernia and he has vomited x3.   Dr Ruel Favors has called about this pt  .  The edp is to see and call general surgery to see

## 2014-07-04 NOTE — Consult Note (Signed)
CARDIOLOGY CONSULT NOTE  Patient ID: Joseph [name], MRN: [id number], DOB/AGE: Jan 31, 1941 73 y.o. Admit date: 07/04/2014 Date of Consult: 07/04/2014  Primary Physician: [name], MD Primary Cardiologist: Dr. Ena Dawley  Chief Complaint: nausea/vomiting Reason for Consultation: atrial fibrillation with RVR  HPI: 73 y.o. male w/ PMHx significant for PVD s/p aorta to iliac bypass 10/2013, nonobs CAD by cath remotely, lung CA s/p chemo/rad 2012, HTN, hyperlipidemia and HFpEF who presented to [facility] on 07/04/2014 with complaints of nausea and vomiting, acutely worse over the last 4 days. Had recently been evaluated by vascular due to concerns of incisional hernia and underwent CT on 10/29 which demonstrated this finding. With worsening symptoms, presented to ER for treatment.    He has had nonbloody emesis, brown but not coffee ground. Felt very weak. No chest pain. Was found at presentation to be in afib with RVR, with rates 170s. He was unaware of his rapid pulse (and is still unaware). No syncope but globally very weak feeling. Has been off most of his medications over the last 4 days due to the nausea. Only cardiac medication was lasix for which he has been off. No PND, no orthopnea, no LE swelling.  Denies ETOH, illicits such as cocaine or methamphetamine. Drinks 2-3 strong cups of coffee a day. Is unaware of his pulse but based upon him feeling poorly for the last several days, he thinks it occurred in the last 4.  In the ER, was given diltiazem gtt and rates have improves from the 170s to 110s-120s. Currently without cardiac symptoms. NG placed.  Given full dose lovenox injection for afib.   12 lead shows afib with RVR, diffuse scooping of ST at rates of 170. On telemetry, rates of 110-120s.  Past Medical History  Diagnosis Date  . History of colon polyps 2006,2008  . 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 Hyperlipidemia     takes Pravastatin  . Emphysema   . Enlarged  prostate     takes Rapaflo daily  . Arthritis     left knee and back arthrtis  . Hypothyroidism     takes Synthroid daily  . Cataract   . Hx of radiation therapy 09/02/11 to 10/19/11    chest  . Diverticulosis   . Cirrhosis     By radiography, seen on CT  . Rosacea   . CAD (coronary artery disease)   . Shortness of breath   . Heart murmur     teen  . Lung cancer     right upper lobe      Surgical History:  Past Surgical History  Procedure Laterality Date  . Hernia repair  70's  . Vasectomy  70's  . Finger surgery  2008    left pointer finger  . Knee arthroscopy  2008    left knee  . Tonsillectomy      as a child  . Colonoscopy w/ biopsies      multiple   . Cardiac catheterization  2003  . Esophagogastroduodenoscopy  2006  . Portacath placement  08/10/2011    Procedure: INSERTION PORT-A-CATH;  Surgeon: Pierre Bali, MD;  Location: [place];  Service: Thoracic;  Laterality: Left;  Left Subclavian  . Abdominal aortic aneurysm repair N/A 10/31/2013    Procedure: aorto to left external iliac and right external iliac and left internal iliac, reimplantation of inferior mesenteric artery and ligation of left iliac artery aneursym;  Surgeon: Elam Dutch, MD;  Location:  [facility];  Service: Vascular;  Laterality: N/A;  . Thrombectomy iliac artery Bilateral 10/31/2013    Procedure: THROMBECTOMY ILIAC ARTERY;  Surgeon: Elam Dutch, MD;  Location: [place];  Service: Vascular;  Laterality: Bilateral;  . Abdominal wound dehiscence N/A 11/10/2013    Procedure: ABDOMINAL WOUND CLOSURE ;  Surgeon: Serafina Mitchell, MD;  Location: [facility] OR;  Service: Vascular;  Laterality: N/A;     Home Meds: Prior to Admission medications   Medication Sig Start Date End Date Taking? Authorizing Provider  doxycycline (VIBRAMYCIN) 100 MG capsule Take 100 mg by mouth 2 (two) times daily.   Yes Historical Provider, MD  furosemide (LASIX) 20 MG tablet Take 1 tablet (20 mg total) by mouth daily. 01/16/14  Yes  Dorothy Spark, MD  Lactobacillus (DIGESTIVE HEALTH PROBIOTIC) CAPS Take 1 capsule by mouth daily. "30 million cultures"   Yes Historical Provider, MD  lidocaine-prilocaine (EMLA) cream Apply 1 application topically as needed.   08/06/11  Yes Curt Bears, MD  MILK THISTLE PO Take 1 capsule by mouth daily. Take one 1200 mg capsule daily   Yes Historical Provider, MD  Multiple Vitamins-Minerals (MULTIVITAMINS THER. W/MINERALS) TABS Take 1 tablet by mouth daily.     Yes Historical Provider, MD  nystatin-triamcinolone (MYCOLOG II) cream Apply 1 application topically daily as needed. Rash 07/10/11  Yes Historical Provider, MD  SYNTHROID 175 MCG tablet Take 175 mcg by mouth daily.  06/08/11  Yes Stephens Shire, MD  tiotropium (SPIRIVA) 18 MCG inhalation capsule Place 18 mcg into inhaler and inhale daily.   Yes Historical Provider, MD  HYDROcodone-acetaminophen (NORCO/VICODIN) 5-325 MG per tablet Take 1 tablet by mouth every 6 (six) hours as needed. 11/10/13   Samantha J Rhyne, PA-C  metroNIDAZOLE (FLAGYL) 250 MG tablet Take 1 tablet (250 mg total) by mouth 3 (three) times daily. 06/15/14   Gatha Mayer, MD  metroNIDAZOLE (METROGEL) 0.75 % gel Apply 1 application topically 2 (two) times daily.    Historical Provider, MD  OVER THE COUNTER MEDICATION 30 mg 3 (three) times daily. LUNG SUPPORT    Historical Provider, MD  oxyCODONE-acetaminophen (PERCOCET/ROXICET) 5-325 MG per tablet Take 1 tablet by mouth every 4 (four) hours as needed for severe pain. 02/01/14   Elam Dutch, MD  traMADol (ULTRAM) 50 MG tablet Take 50 mg by mouth every 6 (six) hours as needed.    Historical Provider, MD    Inpatient Medications:  . enoxaparin (LOVENOX) injection  1 mg/kg Subcutaneous Q12H   . sodium chloride    . sodium chloride    . sodium chloride 100 mL/hr (07/04/14 2151)  . sodium chloride      Allergies: No Known Allergies  History   Social History  . Marital Status: Married    Spouse Name: N/A     Number of Children: N/A  . Years of Education: N/A   Occupational History  . Not on file.   Social History Main Topics  . Smoking status: Former Smoker -- 1.50 packs/day for 40 years    Types: Cigarettes    Quit date: 07/02/2011  . Smokeless tobacco: Former Systems developer  . Alcohol Use: 0.0 oz/week    0-2 Cans of beer per week     Comment: social  . Drug Use: No  . Sexual Activity: Not on file   Other Topics Concern  . Not on file   Social History Narrative     Family History  Problem Relation Age of Onset  .  Anesthesia problems Neg Hx   . Hypotension Neg Hx   . Malignant hyperthermia Neg Hx   . Pseudochol deficiency Neg Hx   . Cancer Father   . Diabetes Father   . Hyperlipidemia Brother   . AAA (abdominal aortic aneurysm) Brother   . Hyperlipidemia Mother   . Hyperlipidemia Sister      Review of Systems: General: negative for chills, fever, night sweats or weight changes.  Cardiovascular: see BPI Dermatological: negative for rash Respiratory: negative for cough or wheezing Urologic: negative for hematuria Abdominal: see HPI Neurologic: negative for visual changes, syncope, or dizziness All other systems reviewed and are otherwise negative except as noted above.  Labs:  Recent Labs  07/04/14 1858  TROPONINI <0.30   Lab Results  Component Value Date   WBC 12.5* 07/04/2014   HGB 16.8 07/04/2014   HCT 47.7 07/04/2014   MCV 93.9 07/04/2014   PLT 220 07/04/2014    Recent Labs Lab 07/04/14 1858  NA 138  K 3.8  CL 96  CO2 22  BUN 24*  CREATININE 1.37*  CALCIUM 10.1  PROT 8.0  BILITOT 2.0*  ALKPHOS 161*  ALT 26  AST 33  GLUCOSE 135*   No results found for: CHOL, HDL, LDLCALC, TRIG  Radiology/Studies:  Ct Chest W Contrast  07/03/2014   CLINICAL DATA:  CHEST CT ORDERED FOR RESTAGING LUNG CANCER BY DR. Julien Nordmann.  CTA OF THE ABDOMEN AND PELVIS ORDERED BY DR. Oneida Alar FOR TRIPLE A REPAIR FOLLOW-UP.  EXAM: CT CHEST WITH CONTRAST  CT ABDOMEN AND PELVIS  ANGIOGRAPHY WITH AND WITHOUT CONTRAST  TECHNIQUE: Multidetector CT imaging of the chest was performed during intravenous contrast administration. Multidetector CT imaging of the abdomen and pelvis was performed following the standard protocol before and during bolus administration of intravenous contrast.  CONTRAST:  119mL OMNIPAQUE IOHEXOL 350 MG/ML SOLN  COMPARISON:  Abdominal CT 09/05/2013.  Chest CT 01/01/2014.  FINDINGS: CT CHEST FINDINGS  THORACIC INLET/BODY WALL:  Porta catheter from the left in stable position.  Atrophic or resected thyroid gland. No supraclavicular or axillary adenopathy.  MEDIASTINUM:  No cardiomegaly or pericardial effusion. Diffuse atherosclerosis, including the coronary arteries. No acute vascular findings.  No suspicious or enlarging mediastinal lymph nodes. Previously measured right juxta diaphragmatic node is smaller at 6 mm short axis. A upper prevascular node is stable at 6 mm short axis.  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 No soft tissue increase or nodular component suggestive of recurrent disease. Stable subpleural nodule along the right minor fissure measuring 9 mm. No new pulmonary nodule.  Panlobular and centrilobular emphysema. No pneumonia, edema, effusion, or pneumothorax.  UPPER ABDOMEN:  See below  OSSEOUS:  No acute fracture.  No suspicious lytic or blastic lesions.  CT ABDOMEN AND PELVIS FINDINGS  BODY WALL: There is an incisional hernia just above the umbilicus which contains small bowel and results in a partial small bowel obstruction.  Fatty right inguinal hernia which is small.  Liver: No focal abnormality.  Biliary: No evidence of biliary obstruction or stone.  Pancreas: Unremarkable.  Spleen: Unremarkable.  Adrenals: Unremarkable.  Kidneys and ureters: No hydronephrosis or stone.  Bladder: Unremarkable.  Reproductive: Enlarged prostate, deforming the bladder base.  Morphology is stable from previous.  Bowel: There is dilated proximal small bowel leading to a transition point within the above described hernia sac. Some fluid is present within the relatively decompressed distal small bowel, and  the colon is not emptied, compatible with partial obstruction. No evidence of bowel devascularization/necrosis. Negative appendix. Extensive distal colonic diverticulosis without active inflammation.  Retroperitoneum: No mass or adenopathy.  Peritoneum: No ascites or pneumoperitoneum.  Vascular: Aortic branching is standard. 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  Status post bypass of an abdominal aortic aneurysm to the bilateral external iliacs and left internal iliac arteries. The graft is widely patent. Reimplanted IMA is widely patent. Branches of the left hypogastric artery are patent. The new maximal infrarenal aortic diameter measures 31 mm. The new maximal left common iliac artery diameter is 23 mm. The new maximal right common iliac artery diameter is 19 mm. These diameters include the collapsed aneurysm sac wall. The distal left high epigastric artery measures 15 mm in maximal diameter. No concerning periaortic fat infiltration or gas.  OSSEOUS: No acute abnormalities.  These results will be called to the ordering clinician or representative by the Radiologist Assistant, and communication documented in the PACS or zVision Dashboard.  IMPRESSION: 1. Incisional hernia containing small bowel. There is related partial small bowel obstruction. 2. No evidence of recurrent lung cancer. 3. Status post aortic and iliac aneurysm bypass. New baseline anatomy is described above.   Electronically Signed   By: Jorje Guild M.D.   On: 07/03/2014 14:18   US Abdomen Complete  06/19/2014   CLINICAL DATA:  Upper abdominal pain.  Cirrhosis of the liver.  EXAM: ULTRASOUND ABDOMEN COMPLETE  COMPARISON:  09/05/2013  FINDINGS: Gallbladder: No  gallstones or wall thickening visualized. No sonographic Murphy sign noted.  Common bile duct: Diameter: Normal caliber, 4 mm  Liver: Coarsened echotexture with nodular contours compatible with cirrhosis. No focal abnormality.  IVC: No abnormality visualized.  Pancreas: Visualized portion unremarkable.  Spleen: Size and appearance within normal limits.  Right Kidney: Length: 11.8 cm. Echogenicity within normal limits. No mass or hydronephrosis visualized.  Left Kidney: Length: 12.4 cm. Echogenicity within normal limits. No mass or hydronephrosis visualized.  Abdominal aorta: No aneurysm visualized.  Other findings: None.  IMPRESSION: Changes of cirrhosis. No focal hepatic abnormality. No acute findings.   Electronically Signed   By: Rolm Baptise M.D.   On: 06/19/2014 08:25   Ct Angio Abd/pel W/ And/or W/o  07/03/2014   CLINICAL DATA:  CHEST CT ORDERED FOR RESTAGING LUNG CANCER BY DR. Julien Nordmann.  CTA OF THE ABDOMEN AND PELVIS ORDERED BY DR. Oneida Alar FOR TRIPLE A REPAIR FOLLOW-UP.  EXAM: CT CHEST WITH CONTRAST  CT ABDOMEN AND PELVIS ANGIOGRAPHY WITH AND WITHOUT CONTRAST  TECHNIQUE: Multidetector CT imaging of the chest was performed during intravenous contrast administration. Multidetector CT imaging of the abdomen and pelvis was performed following the standard protocol before and during bolus administration of intravenous contrast.  CONTRAST:  141mL OMNIPAQUE IOHEXOL 350 MG/ML SOLN  COMPARISON:  Abdominal CT 09/05/2013.  Chest CT 01/01/2014.  FINDINGS: CT CHEST FINDINGS  THORACIC INLET/BODY WALL:  Porta catheter from the left in stable position.  Atrophic or resected thyroid gland. No supraclavicular or axillary adenopathy.  MEDIASTINUM:  No cardiomegaly or pericardial effusion. Diffuse atherosclerosis, including the coronary arteries. No acute vascular findings.  No suspicious or enlarging mediastinal lymph nodes. Previously measured right juxta diaphragmatic node is smaller at 6 mm short axis. A upper prevascular  node is stable at 6 mm short axis.  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 No soft tissue  increase or nodular component suggestive of recurrent disease. Stable subpleural nodule along the right minor fissure measuring 9 mm. No new pulmonary nodule.  Panlobular and centrilobular emphysema. No pneumonia, edema, effusion, or pneumothorax.  UPPER ABDOMEN:  See below  OSSEOUS:  No acute fracture.  No suspicious lytic or blastic lesions.  CT ABDOMEN AND PELVIS FINDINGS  BODY WALL: There is an incisional hernia just above the umbilicus which contains small bowel and results in a partial small bowel obstruction.  Fatty right inguinal hernia which is small.  Liver: No focal abnormality.  Biliary: No evidence of biliary obstruction or stone.  Pancreas: Unremarkable.  Spleen: Unremarkable.  Adrenals: Unremarkable.  Kidneys and ureters: No hydronephrosis or stone.  Bladder: Unremarkable.  Reproductive: Enlarged prostate, deforming the bladder base. Morphology is stable from previous.  Bowel: There is dilated proximal small bowel leading to a transition point within the above described hernia sac. Some fluid is present within the relatively decompressed distal small bowel, and the colon is not emptied, compatible with partial obstruction. No evidence of bowel devascularization/necrosis. Negative appendix. Extensive distal colonic diverticulosis without active inflammation.  Retroperitoneum: No mass or adenopathy.  Peritoneum: No ascites or pneumoperitoneum.  Vascular: Aortic branching is standard. 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  Status post bypass of an abdominal aortic aneurysm to the bilateral external iliacs and left internal iliac arteries. The graft is widely patent. Reimplanted IMA is widely patent. Branches of the left hypogastric artery are patent. The new maximal  infrarenal aortic diameter measures 31 mm. The new maximal left common iliac artery diameter is 23 mm. The new maximal right common iliac artery diameter is 19 mm. These diameters include the collapsed aneurysm sac wall. The distal left high epigastric artery measures 15 mm in maximal diameter. No concerning periaortic fat infiltration or gas.  OSSEOUS: No acute abnormalities.  These results will be called to the ordering clinician or representative by the Radiologist Assistant, and communication documented in the PACS or zVision Dashboard.  IMPRESSION: 1. Incisional hernia containing small bowel. There is related partial small bowel obstruction. 2. No evidence of recurrent lung cancer. 3. Status post aortic and iliac aneurysm bypass. New baseline anatomy is described above.   Electronically Signed   By: Jorje Guild M.D.   On: 07/03/2014 14:18    EKG: afib with RVR, diffuse ST scooping  Physical Exam: Blood pressure 115/92, pulse 122, temperature 98 F (36.7 C), resp. rate 16, height 6' (1.829 m), weight 90.719 kg (200 lb), SpO2 91 %. General: chronically ill appearing, in no acute distress. Head: Normocephalic, atraumatic, sclera non-icteric, no xanthomas, nares are without discharge. NG in place, bilious drainage  Neck: Supple. JVD not elevated. Lungs: Clear bilaterally to auscultation without wheezes, rales, or rhonchi. Breathing is unlabored. Heart: irreg, and tachy 1/6 SEM Abdomen: absent BS, distended Msk:  Strength and tone appear normal for age. Extremities: No clubbing or cyanosis. No edema.  Distal pedal pulses are 2+ and equal bilaterally. Neuro: Alert and oriented X 3. Moves all extremities spontaneously. Psych:  Responds to questions appropriately with a normal affect.   Problem List 1. Nausea/vomiting --> SBO 2. Atrial fibrillation with RVR, new diagnosis 3. PVD s/p bypass 10/2013 4. H/o CAD, nonobst by remote cath 5. Acute renal failure 6. H/o lung CA s/p chem/rad 2013 7.  Hypothyroid, on replacement 8. Heart failure with preserved ejection fraction, currently dehydrated from #1    Assessment and Plan:  73 y.o. male w/ PMHx significant for PVD s/p aorta to iliac bypass 10/2013, nonobs CAD by cath remotely, lung CA s/p chemo/rad 2012, HTN, hyperlipidemia and HFpEF who presented [facility] on 07/04/2014 with complaints of nausea and vomiting, acutely worse over the last 4 days --> small bowel obstruction and incisional hernia. Course complicated by atrial fibrillation with RVR, now on dilt gtt.  Likely trigger for his atrial fibrillation is his acute illness with SBO and nausea and vomiting though he is completely unaware of his heart rate so pinpointing the onset is impossible. Lytes actually okay, will check Mg. Initially troponin negative. Had echo done 10/2013 without significant valvular dz, mild pulm HTN and diastolic dysfunction. No need to repeat at this time. Just had TSH 4 weeks ago, wnl.  In regards to treatment, continue diltiazem gtt for rate goals of < 110 bpm as BP is on the softer side. Hopefully, with improvement in rate control, he may convert on his own back to sinus. Agree with full anticoagulation for stroke reduction. His CHADS2Vasc score is 42 (age, CHF, PVD) and should be considered for long term anticoagulation. Short term, heparin/lovenox is appropriate as he may require surgical procedure for his SBO/hernia.  In regards to his heart failure with preserved ejection fracture, he appears to be dehydrated and is appropriately receiving IVF. However, would be cautious because he is certainly at risk for symptomatic fluid overload given his diastolic dysfunction combined with his atrial fibrillation. Would reassess the need for IVF frequently and discontinue once renal fxn normalizes and maintain even I/Os.  Long term, would benefit from statin. LFTs have now normalized, could reconsider.  Thank you for this consult. Please call with  questions. Will follow along.  Signed, Elias Else, Lusia Greis C. MD 07/04/2014, 10:26 PM

## 2014-07-04 NOTE — ED Notes (Signed)
Cardiologist at the bedside

## 2014-07-04 NOTE — ED Notes (Signed)
Pt monitored by pulse ox, bp cuff, and 12-lead. 

## 2014-07-04 NOTE — Telephone Encounter (Signed)
Phone call from pt.  Stated he now has "dry heaves."  Reported he ate jello, and vomited shortly after eating it.  Stated he has a "funny taste in his mouth".  Reported a continuous "dull pain" in the abdomen; rates "6-7" / 10.  Described that there is an intermittent gurgling in his abdomen, then the pain intensifies, and there is a bulge that occurs for about 10 seconds, and then goes back down; the pain eases a little when the bulge subsides.  Also, reports he still has pain in the shoulder blade region.  Advised if the nausea and vomiting persist, he should go to the [facility] ER.   Pt. stated he will make that decision when his wife gets home this evening, from a trip.

## 2014-07-04 NOTE — ED Notes (Signed)
Called blood bank - states that they have the sample.

## 2014-07-04 NOTE — Consult Note (Signed)
Reason for Consult:Ventral hernia and bowel obstruction Referring Physician: [name] is an 73 y.o. male.  HPI: Patient states that he has been getting sick for over a week.  Was seen in the office yesterday and a CT scan was done demonstrating a loop of small bowel in a chronically incarcerated ventral hernia, but today started having nausea and vomiting.  Found to be in atrial fibrillation (new onset) in the ED.  History of lung cancer.  Past Medical History  Diagnosis Date  . History of colon polyps 2006,2008  . 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 Hyperlipidemia     takes Pravastatin  . Emphysema   . Enlarged prostate     takes Rapaflo daily  . Arthritis     left knee and back arthrtis  . Hypothyroidism     takes Synthroid daily  . Cataract   . Hx of radiation therapy 09/02/11 to 10/19/11    chest  . Diverticulosis   . Cirrhosis     By radiography, seen on CT  . Rosacea   . CAD (coronary artery disease)   . Shortness of breath   . Heart murmur     teen  . Lung cancer     right upper lobe    Past Surgical History  Procedure Laterality Date  . Hernia repair  70's  . Vasectomy  70's  . Finger surgery  2008    left pointer finger  . Knee arthroscopy  2008    left knee  . Tonsillectomy      as a child  . Colonoscopy w/ biopsies      multiple   . Cardiac catheterization  2003  . Esophagogastroduodenoscopy  2006  . Portacath placement  08/10/2011    Procedure: INSERTION PORT-A-CATH;  Surgeon: Pierre Bali, MD;  Location: [place];  Service: Thoracic;  Laterality: Left;  Left Subclavian  . Abdominal aortic aneurysm repair N/A 10/31/2013    Procedure: aorto to left external iliac and right external iliac and left internal iliac, reimplantation of inferior mesenteric artery and ligation of left iliac artery aneursym;  Surgeon: Elam Dutch, MD;  Location: [place];  Service: Vascular;  Laterality: N/A;  . Thrombectomy iliac artery Bilateral 10/31/2013   Procedure: THROMBECTOMY ILIAC ARTERY;  Surgeon: Elam Dutch, MD;  Location: [place];  Service: Vascular;  Laterality: Bilateral;  . Abdominal wound dehiscence N/A 11/10/2013    Procedure: ABDOMINAL WOUND CLOSURE ;  Surgeon: Serafina Mitchell, MD;  Location: [facility] OR;  Service: Vascular;  Laterality: N/A;    Family History  Problem Relation Age of Onset  . Anesthesia problems Neg Hx   . Hypotension Neg Hx   . Malignant hyperthermia Neg Hx   . Pseudochol deficiency Neg Hx   . Cancer Father   . Diabetes Father   . Hyperlipidemia Brother   . AAA (abdominal aortic aneurysm) Brother   . Hyperlipidemia Mother   . Hyperlipidemia Sister     Social History:  reports that he quit smoking about 3 years ago. His smoking use included Cigarettes. He has a 60 pack-year smoking history. He has quit using smokeless tobacco. He reports that he drinks alcohol. He reports that he does not use illicit drugs.  Allergies: No Known Allergies  Medications: I have reviewed the patient's current medications.  Results for orders placed or performed during the hospital encounter of 07/04/14 (from the past 48 hour(s))  Sample to Blood Bank  Status: None   Collection Time: 07/04/14  6:55 PM  Result Value Ref Range   Blood Bank Specimen SAMPLE AVAILABLE FOR TESTING    Sample Expiration 07/05/2014   CBC with Differential     Status: Abnormal   Collection Time: 07/04/14  6:58 PM  Result Value Ref Range   WBC 12.5 (H) 4.0 - 10.5 K/uL   RBC 5.08 4.22 - 5.81 MIL/uL   Hemoglobin 16.8 13.0 - 17.0 g/dL   HCT 47.7 39.0 - 52.0 %   MCV 93.9 78.0 - 100.0 fL   MCH 33.1 26.0 - 34.0 pg   MCHC 35.2 30.0 - 36.0 g/dL   RDW 13.9 11.5 - 15.5 %   Platelets 220 150 - 400 K/uL   Neutrophils Relative % 82 (H) 43 - 77 %   Neutro Abs 10.3 (H) 1.7 - 7.7 K/uL   Lymphocytes Relative 12 12 - 46 %   Lymphs Abs 1.4 0.7 - 4.0 K/uL   Monocytes Relative 6 3 - 12 %   Monocytes Absolute 0.7 0.1 - 1.0 K/uL   Eosinophils Relative 0 0 - 5  %   Eosinophils Absolute 0.0 0.0 - 0.7 K/uL   Basophils Relative 0 0 - 1 %   Basophils Absolute 0.0 0.0 - 0.1 K/uL  Comprehensive metabolic panel     Status: Abnormal   Collection Time: 07/04/14  6:58 PM  Result Value Ref Range   Sodium 138 137 - 147 mEq/L   Potassium 3.8 3.7 - 5.3 mEq/L   Chloride 96 96 - 112 mEq/L   CO2 22 19 - 32 mEq/L   Glucose, Bld 135 (H) 70 - 99 mg/dL   BUN 24 (H) 6 - 23 mg/dL   Creatinine, Ser 1.37 (H) 0.50 - 1.35 mg/dL   Calcium 10.1 8.4 - 10.5 mg/dL   Total Protein 8.0 6.0 - 8.3 g/dL   Albumin 3.7 3.5 - 5.2 g/dL   AST 33 0 - 37 U/L   ALT 26 0 - 53 U/L   Alkaline Phosphatase 161 (H) 39 - 117 U/L   Total Bilirubin 2.0 (H) 0.3 - 1.2 mg/dL   GFR calc non Af Amer 50 (L) >90 mL/min   GFR calc Af Amer 58 (L) >90 mL/min    Comment: (NOTE) The eGFR has been calculated using the CKD EPI equation. This calculation has not been validated in all clinical situations. eGFR's persistently <90 mL/min signify possible Chronic Kidney Disease.    Anion gap 20 (H) 5 - 15  Lipase, blood     Status: None   Collection Time: 07/04/14  6:58 PM  Result Value Ref Range   Lipase 26 11 - 59 U/L  Troponin I     Status: None   Collection Time: 07/04/14  6:58 PM  Result Value Ref Range   Troponin I <0.30 <0.30 ng/mL    Comment:        Due to the release kinetics of cTnI, a negative result within the first hours of the onset of symptoms does not rule out myocardial infarction with certainty. If myocardial infarction is still suspected, repeat the test at appropriate intervals.     Ct Chest W Contrast  07/03/2014   CLINICAL DATA:  CHEST CT ORDERED FOR RESTAGING LUNG CANCER BY DR. Julien Nordmann.  CTA OF THE ABDOMEN AND PELVIS ORDERED BY DR. Oneida Alar FOR TRIPLE A REPAIR FOLLOW-UP.  EXAM: CT CHEST WITH CONTRAST  CT ABDOMEN AND PELVIS ANGIOGRAPHY WITH AND WITHOUT CONTRAST  TECHNIQUE: Multidetector  CT imaging of the chest was performed during intravenous contrast administration.  Multidetector CT imaging of the abdomen and pelvis was performed following the standard protocol before and during bolus administration of intravenous contrast.  CONTRAST:  180m OMNIPAQUE IOHEXOL 350 MG/ML SOLN  COMPARISON:  Abdominal CT 09/05/2013.  Chest CT 01/01/2014.  FINDINGS: CT CHEST FINDINGS  THORACIC INLET/BODY WALL:  Porta catheter from the left in stable position.  Atrophic or resected thyroid gland. No supraclavicular or axillary adenopathy.  MEDIASTINUM:  No cardiomegaly or pericardial effusion. Diffuse atherosclerosis, including the coronary arteries. No acute vascular findings.  No suspicious or enlarging mediastinal lymph nodes. Previously measured right juxta diaphragmatic node is smaller at 6 mm short axis. A upper prevascular node is stable at 6 mm short axis.  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 No soft tissue increase or nodular component suggestive of recurrent disease. Stable subpleural nodule along the right minor fissure measuring 9 mm. No new pulmonary nodule.  Panlobular and centrilobular emphysema. No pneumonia, edema, effusion, or pneumothorax.  UPPER ABDOMEN:  See below  OSSEOUS:  No acute fracture.  No suspicious lytic or blastic lesions.  CT ABDOMEN AND PELVIS FINDINGS  BODY WALL: There is an incisional hernia just above the umbilicus which contains small bowel and results in a partial small bowel obstruction.  Fatty right inguinal hernia which is small.  Liver: No focal abnormality.  Biliary: No evidence of biliary obstruction or stone.  Pancreas: Unremarkable.  Spleen: Unremarkable.  Adrenals: Unremarkable.  Kidneys and ureters: No hydronephrosis or stone.  Bladder: Unremarkable.  Reproductive: Enlarged prostate, deforming the bladder base. Morphology is stable from previous.  Bowel: There is dilated proximal small bowel leading to a transition point within the above described hernia sac.  Some fluid is present within the relatively decompressed distal small bowel, and the colon is not emptied, compatible with partial obstruction. No evidence of bowel devascularization/necrosis. Negative appendix. Extensive distal colonic diverticulosis without active inflammation.  Retroperitoneum: No mass or adenopathy.  Peritoneum: No ascites or pneumoperitoneum.  Vascular: Aortic branching is standard. 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  Status post bypass of an abdominal aortic aneurysm to the bilateral external iliacs and left internal iliac arteries. The graft is widely patent. Reimplanted IMA is widely patent. Branches of the left hypogastric artery are patent. The new maximal infrarenal aortic diameter measures 31 mm. The new maximal left common iliac artery diameter is 23 mm. The new maximal right common iliac artery diameter is 19 mm. These diameters include the collapsed aneurysm sac wall. The distal left high epigastric artery measures 15 mm in maximal diameter. No concerning periaortic fat infiltration or gas.  OSSEOUS: No acute abnormalities.  These results will be called to the ordering clinician or representative by the Radiologist Assistant, and communication documented in the PACS or zVision Dashboard.  IMPRESSION: 1. Incisional hernia containing small bowel. There is related partial small bowel obstruction. 2. No evidence of recurrent lung cancer. 3. Status post aortic and iliac aneurysm bypass. New baseline anatomy is described above.   Electronically Signed   By: JJorje GuildM.D.   On: 07/03/2014 14:18   Ct Angio Abd/pel W/ And/or W/o  07/03/2014   CLINICAL DATA:  CHEST CT ORDERED FOR RESTAGING LUNG CANCER BY DR. MJulien Nordmann  CTA OF THE ABDOMEN AND PELVIS ORDERED BY DR. FOneida AlarFOR TRIPLE A REPAIR FOLLOW-UP.  EXAM: CT CHEST WITH CONTRAST  CT ABDOMEN AND PELVIS ANGIOGRAPHY WITH AND WITHOUT CONTRAST  TECHNIQUE: Multidetector CT imaging of  the chest was performed during intravenous contrast administration. Multidetector CT imaging of the abdomen and pelvis was performed following the standard protocol before and during bolus administration of intravenous contrast.  CONTRAST:  176m OMNIPAQUE IOHEXOL 350 MG/ML SOLN  COMPARISON:  Abdominal CT 09/05/2013.  Chest CT 01/01/2014.  FINDINGS: CT CHEST FINDINGS  THORACIC INLET/BODY WALL:  Porta catheter from the left in stable position.  Atrophic or resected thyroid gland. No supraclavicular or axillary adenopathy.  MEDIASTINUM:  No cardiomegaly or pericardial effusion. Diffuse atherosclerosis, including the coronary arteries. No acute vascular findings.  No suspicious or enlarging mediastinal lymph nodes. Previously measured right juxta diaphragmatic node is smaller at 6 mm short axis. A upper prevascular node is stable at 6 mm short axis.  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 No soft tissue increase or nodular component suggestive of recurrent disease. Stable subpleural nodule along the right minor fissure measuring 9 mm. No new pulmonary nodule.  Panlobular and centrilobular emphysema. No pneumonia, edema, effusion, or pneumothorax.  UPPER ABDOMEN:  See below  OSSEOUS:  No acute fracture.  No suspicious lytic or blastic lesions.  CT ABDOMEN AND PELVIS FINDINGS  BODY WALL: There is an incisional hernia just above the umbilicus which contains small bowel and results in a partial small bowel obstruction.  Fatty right inguinal hernia which is small.  Liver: No focal abnormality.  Biliary: No evidence of biliary obstruction or stone.  Pancreas: Unremarkable.  Spleen: Unremarkable.  Adrenals: Unremarkable.  Kidneys and ureters: No hydronephrosis or stone.  Bladder: Unremarkable.  Reproductive: Enlarged prostate, deforming the bladder base. Morphology is stable from previous.  Bowel: There is dilated proximal small bowel  leading to a transition point within the above described hernia sac. Some fluid is present within the relatively decompressed distal small bowel, and the colon is not emptied, compatible with partial obstruction. No evidence of bowel devascularization/necrosis. Negative appendix. Extensive distal colonic diverticulosis without active inflammation.  Retroperitoneum: No mass or adenopathy.  Peritoneum: No ascites or pneumoperitoneum.  Vascular: Aortic branching is standard. 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  Status post bypass of an abdominal aortic aneurysm to the bilateral external iliacs and left internal iliac arteries. The graft is widely patent. Reimplanted IMA is widely patent. Branches of the left hypogastric artery are patent. The new maximal infrarenal aortic diameter measures 31 mm. The new maximal left common iliac artery diameter is 23 mm. The new maximal right common iliac artery diameter is 19 mm. These diameters include the collapsed aneurysm sac wall. The distal left high epigastric artery measures 15 mm in maximal diameter. No concerning periaortic fat infiltration or gas.  OSSEOUS: No acute abnormalities.  These results will be called to the ordering clinician or representative by the Radiologist Assistant, and communication documented in the PACS or zVision Dashboard.  IMPRESSION: 1. Incisional hernia containing small bowel. There is related partial small bowel obstruction. 2. No evidence of recurrent lung cancer. 3. Status post aortic and iliac aneurysm bypass. New baseline anatomy is described above.   Electronically Signed   By: JJorje GuildM.D.   On: 07/03/2014 14:18    Review of Systems  Constitutional: Negative for fever.  Gastrointestinal: Positive for nausea, vomiting and diarrhea. Negative for abdominal pain.  Blood pressure 102/76, pulse 84, temperature 98 F (36.7 C), resp. rate 17, height 6' (1.829 m), weight 90.719  kg (200 lb), SpO2 92 %. Physical Exam  Constitutional: He is oriented to person, place, and time. He appears well-developed and well-nourished.  HENT:  Head: Normocephalic and atraumatic.  Eyes: Pupils are equal, round, and reactive to light.  Neck: Normal range of motion.  Cardiovascular: An irregularly irregular rhythm present. Tachycardia present.   Atrial fibrillation  Respiratory: Effort normal and breath sounds normal.    GI: Soft. Bowel sounds are normal. There is tenderness. There is no rigidity, no rebound and no guarding.    Musculoskeletal: Normal range of motion.  Neurological: He is alert and oriented to person, place, and time. He has normal reflexes.  Skin: Skin is dry.  Psychiatric: He has a normal mood and affect. His behavior is normal. Judgment and thought content normal.    Assessment/Plan: SBO from chronically incarcerated ventral hernia History of previous repair with mesh Currently his abdomen is soft and nontender with good bowel sounds. NGT is in place and draining a lot of non-bilious fluid There is no bowel at risk.  Patient in atrial fibrillation with HR 135-177.  Needs probably cardiac clearance after he has been resuscitated with fluids and HR controlled. He will be admitted by medicine and we will follow. Continue to hydrate and need cardiazem drip.  Joseph [name], JAY 07/04/2014, 8:23 PM

## 2014-07-04 NOTE — Telephone Encounter (Signed)
Spoke with pt regarding ventral hernia on CT scan.  He is having intermittent episodes of diarrhea followed by constipation and intermittent episodes of inability to pass flatus.  I spoke with Dr Georgette Dover of [facility] who is scheduling appt for him to consider repair.  Told Mr Joseph [name] if symptoms get worse prior to call us.  Remainder of CT shows aneurysm repair intact and no other obvious pathology.  Ruta Hinds, MD [facility] Office: [phone] Pager: [phone]

## 2014-07-04 NOTE — Progress Notes (Signed)
ANTICOAGULATION CONSULT NOTE - Initial Consult  Pharmacy Consult for Lovenox  Indication: atrial fibrillation  No Known Allergies  Patient Measurements: Height: 6' (182.9 cm) Weight: 200 lb (90.719 kg) IBW/kg (Calculated) : 77.6  Vital Signs: Temp: 98 F (36.7 C) (11/04 1839) BP: 97/67 mmHg (11/04 2100) Pulse Rate: 114 (11/04 2100)  Labs:  Recent Labs  07/03/14 1024 07/04/14 1858  HGB 15.4 16.8  HCT 46.4 47.7  PLT 209 220  CREATININE 1.0 1.37*  TROPONINI  --  <0.30    Estimated Creatinine Clearance: 54.3 mL/min (by C-G formula based on Cr of 1.37).   Medical History: Past Medical History  Diagnosis Date  . History of colon polyps 2006,2008  . 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 Hyperlipidemia     takes Pravastatin  . Emphysema   . Enlarged prostate     takes Rapaflo daily  . Arthritis     left knee and back arthrtis  . Hypothyroidism     takes Synthroid daily  . Cataract   . Hx of radiation therapy 09/02/11 to 10/19/11    chest  . Diverticulosis   . Cirrhosis     By radiography, seen on CT  . Rosacea   . CAD (coronary artery disease)   . Shortness of breath   . Heart murmur     teen  . Lung cancer     right upper lobe    Medications:   (Not in a hospital admission)  Assessment: 51 YOM who presented to the ED with nausea and vomiting. He was found to in atrial fibrillation (new onset). Pharmacy consulted to start Lovenox. He was not on any anticoagulation prior to admission. H/H and Plt wnl   Goal of Therapy:  Anti-Xa level 0.6-1 units/ml 4hrs after LMWH dose given if s/s of bleeding occur  Monitor platelets by anticoagulation protocol: Yes   Plan:  -Start Lovenox 1 mg/kg SQ Q 12 hours  -Monitor Q 72 h CBC, renal fx, and s/s of bleeding  -F/u long term anticaog plans   Albertina Parr, PharmD.  Clinical Pharmacist Pager [phone]

## 2014-07-04 NOTE — ED Notes (Signed)
MD at bedside. 

## 2014-07-04 NOTE — Consult Note (Signed)
Vascular Surgery Consultation  Reason for Consult: partial small bowel obstruction from ventral hernia  HPI: Joseph [name] is a 73 y.o. male who presents for evaluation of partial small bowel obstruction. Patient of Dr. Juanda Crumble feels that aneurysm resection in February 2015. He required return to the OR in March 2015 by Dr. Trula Slade for partial dehiscence. He apparently has had some hernia repairs in the past. Over the past several days he has developed a significant bulge in the CT scan obtained yesterday revealed a ventral hernia with some small bowel involved in partial obstruction. Patient was scheduled to see Dr. Gretta Arab next week for elective repair and to see Dr. Juanda Crumble feels tomorrow in the office. He began having some vomiting today of bilious fluid and also some loose bowel movements and was brought to the emergency department for evaluation.   Past Medical History  Diagnosis Date  . History of colon polyps 2006,2008  . 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 Hyperlipidemia     takes Pravastatin  . Emphysema   . Enlarged prostate     takes Rapaflo daily  . Arthritis     left knee and back arthrtis  . Hypothyroidism     takes Synthroid daily  . Cataract   . Hx of radiation therapy 09/02/11 to 10/19/11    chest  . Diverticulosis   . Cirrhosis     By radiography, seen on CT  . Rosacea   . CAD (coronary artery disease)   . Shortness of breath   . Heart murmur     teen  . Lung cancer     right upper lobe   Past Surgical History  Procedure Laterality Date  . Hernia repair  70's  . Vasectomy  70's  . Finger surgery  2008    left pointer finger  . Knee arthroscopy  2008    left knee  . Tonsillectomy      as a child  . Colonoscopy w/ biopsies      multiple   . Cardiac catheterization  2003  . Esophagogastroduodenoscopy  2006  . Portacath placement  08/10/2011    Procedure: INSERTION PORT-A-CATH;  Surgeon: Pierre Bali, MD;  Location: [place];  Service: Thoracic;   Laterality: Left;  Left Subclavian  . Abdominal aortic aneurysm repair N/A 10/31/2013    Procedure: aorto to left external iliac and right external iliac and left internal iliac, reimplantation of inferior mesenteric artery and ligation of left iliac artery aneursym;  Surgeon: Elam Dutch, MD;  Location: [place];  Service: Vascular;  Laterality: N/A;  . Thrombectomy iliac artery Bilateral 10/31/2013    Procedure: THROMBECTOMY ILIAC ARTERY;  Surgeon: Elam Dutch, MD;  Location: [place];  Service: Vascular;  Laterality: Bilateral;  . Abdominal wound dehiscence N/A 11/10/2013    Procedure: ABDOMINAL WOUND CLOSURE ;  Surgeon: Serafina Mitchell, MD;  Location: [facility] OR;  Service: Vascular;  Laterality: N/A;   History   Social History  . Marital Status: Married    Spouse Name: N/A    Number of Children: N/A  . Years of Education: N/A   Social History Main Topics  . Smoking status: Former Smoker -- 1.50 packs/day for 40 years    Types: Cigarettes    Quit date: 07/02/2011  . Smokeless tobacco: Former Systems developer  . Alcohol Use: 0.0 oz/week    0-2 Cans of beer per week     Comment: social  . Drug Use: No  .  Sexual Activity: None   Other Topics Concern  . None   Social History Narrative   Family History  Problem Relation Age of Onset  . Anesthesia problems Neg Hx   . Hypotension Neg Hx   . Malignant hyperthermia Neg Hx   . Pseudochol deficiency Neg Hx   . Cancer Father   . Diabetes Father   . Hyperlipidemia Brother   . AAA (abdominal aortic aneurysm) Brother   . Hyperlipidemia Mother   . Hyperlipidemia Sister    No Known Allergies Prior to Admission medications   Medication Sig Start Date End Date Taking? Authorizing Provider  doxycycline (VIBRAMYCIN) 100 MG capsule Take 100 mg by mouth 2 (two) times daily.    Historical Provider, MD  furosemide (LASIX) 20 MG tablet Take 1 tablet (20 mg total) by mouth daily. 01/16/14   Dorothy Spark, MD  HYDROcodone-acetaminophen (NORCO/VICODIN)  5-325 MG per tablet Take 1 tablet by mouth every 6 (six) hours as needed. 11/10/13   Samantha J Rhyne, PA-C  lidocaine-prilocaine (EMLA) cream Apply 1 application topically as needed.   08/06/11   Curt Bears, MD  metroNIDAZOLE (FLAGYL) 250 MG tablet Take 1 tablet (250 mg total) by mouth 3 (three) times daily. 06/15/14   Gatha Mayer, MD  metroNIDAZOLE (METROGEL) 0.75 % gel Apply 1 application topically 2 (two) times daily.    Historical Provider, MD  MILK THISTLE PO Take 1 capsule by mouth daily. Take one 1200 mg capsule daily    Historical Provider, MD  Multiple Vitamins-Minerals (MULTIVITAMINS THER. W/MINERALS) TABS Take 1 tablet by mouth daily.      Historical Provider, MD  nystatin-triamcinolone (MYCOLOG II) cream Apply 1 application topically daily as needed. Rash 07/10/11   Historical Provider, MD  OVER THE COUNTER MEDICATION 30 mg 3 (three) times daily. LUNG SUPPORT    Historical Provider, MD  oxyCODONE-acetaminophen (PERCOCET/ROXICET) 5-325 MG per tablet Take 1 tablet by mouth every 4 (four) hours as needed for severe pain. 02/01/14   Elam Dutch, MD  SYNTHROID 175 MCG tablet Take 175 mcg by mouth daily.  06/08/11   Stephens Shire, MD  tiotropium (SPIRIVA) 18 MCG inhalation capsule Place 18 mcg into inhaler and inhale daily.    Historical Provider, MD  traMADol (ULTRAM) 50 MG tablet Take 50 mg by mouth every 6 (six) hours as needed.    Historical Provider, MD    All other systems have been reviewed and were otherwise negative with the exception of those mentioned in the HPI and as above.  Physical Exam: Filed Vitals:   07/04/14 1839  BP: 86/54  Pulse: 96  Temp: 98 F (36.7 C)  Resp: 16    General: Alert, no acute distress HEENT: Normal for age Cardiovascular: Regular rate and rhythm. Carotid pulses 2+, no bruits audible Respiratory: Clear to auscultation. No cyanosis, no use of accessory musculature GI: No organomegaly, abdomen is soft and non-tender Skin: No lesions in  the area of chief complaint Neurologic: Sensation intact distally Psychiatric: Patient is competent for consent with normal mood and affect Musculoskeletal: No obvious deformities Extremities: unremarkable 3+ femoral pulses bilaterally   CT scan was obtained yesterday which I reviewed. Patient does haveSmall bowel in defect in ventral hernia. Partial obstruction was demonstrated. Patient has had no symptoms until today.   Assessment/Plan:  Ventral hernia with small bowel intermittently within the hernia causing partial small bowel obstruction today with nausea and vomiting Ventral hernia will clearly need to be repaired. Patient was to  have been evaluated on Monday by general surgery Will get general surgical consult for possible admission and repair at this time   Tinnie Gens, MD 07/04/2014 7:55 PM

## 2014-07-04 NOTE — ED Notes (Signed)
Spoke with lab - states they will add on the Troponin

## 2014-07-05 ENCOUNTER — Ambulatory Visit: Payer: Medicare Other | Admitting: Vascular Surgery

## 2014-07-05 DIAGNOSIS — K56609 Unspecified intestinal obstruction, unspecified as to partial versus complete obstruction: Secondary | ICD-10-CM | POA: Diagnosis present

## 2014-07-05 DIAGNOSIS — K420 Umbilical hernia with obstruction, without gangrene: Secondary | ICD-10-CM

## 2014-07-05 DIAGNOSIS — I4891 Unspecified atrial fibrillation: Secondary | ICD-10-CM

## 2014-07-05 HISTORY — DX: Unspecified intestinal obstruction, unspecified as to partial versus complete obstruction: K56.609

## 2014-07-05 HISTORY — DX: Umbilical hernia with obstruction, without gangrene: K42.0

## 2014-07-05 LAB — CBC
HCT: 44.4 % (ref 39.0–52.0)
Hemoglobin: 15.8 g/dL (ref 13.0–17.0)
MCH: 34.2 pg — AB (ref 26.0–34.0)
MCHC: 35.6 g/dL (ref 30.0–36.0)
MCV: 96.1 fL (ref 78.0–100.0)
PLATELETS: 186 10*3/uL (ref 150–400)
RBC: 4.62 MIL/uL (ref 4.22–5.81)
RDW: 14.2 % (ref 11.5–15.5)
WBC: 11.1 10*3/uL — ABNORMAL HIGH (ref 4.0–10.5)

## 2014-07-05 LAB — COMPREHENSIVE METABOLIC PANEL
ALK PHOS: 136 U/L — AB (ref 39–117)
ALT: 24 U/L (ref 0–53)
ANION GAP: 15 (ref 5–15)
AST: 31 U/L (ref 0–37)
Albumin: 3.2 g/dL — ABNORMAL LOW (ref 3.5–5.2)
BUN: 32 mg/dL — AB (ref 6–23)
CO2: 27 meq/L (ref 19–32)
Calcium: 9.4 mg/dL (ref 8.4–10.5)
Chloride: 97 mEq/L (ref 96–112)
Creatinine, Ser: 1.97 mg/dL — ABNORMAL HIGH (ref 0.50–1.35)
GFR, EST AFRICAN AMERICAN: 38 mL/min — AB (ref >90)
GFR, EST NON AFRICAN AMERICAN: 32 mL/min — AB (ref >90)
GLUCOSE: 117 mg/dL — AB (ref 70–99)
Potassium: 3.7 mEq/L (ref 3.7–5.3)
SODIUM: 139 meq/L (ref 137–147)
TOTAL PROTEIN: 7.1 g/dL (ref 6.0–8.3)
Total Bilirubin: 1.9 mg/dL — ABNORMAL HIGH (ref 0.3–1.2)

## 2014-07-05 LAB — URINALYSIS, ROUTINE W REFLEX MICROSCOPIC
Glucose, UA: NEGATIVE mg/dL
Hgb urine dipstick: NEGATIVE
Ketones, ur: 15 mg/dL — AB
Nitrite: NEGATIVE
PROTEIN: 30 mg/dL — AB
SPECIFIC GRAVITY, URINE: 1.031 — AB (ref 1.005–1.030)
UROBILINOGEN UA: 1 mg/dL (ref 0.0–1.0)
pH: 5 (ref 5.0–8.0)

## 2014-07-05 LAB — URINE MICROSCOPIC-ADD ON

## 2014-07-05 LAB — HEPARIN LEVEL (UNFRACTIONATED): Heparin Unfractionated: 0.7 IU/mL (ref 0.30–0.70)

## 2014-07-05 LAB — MRSA PCR SCREENING: MRSA by PCR: NEGATIVE

## 2014-07-05 MED ORDER — SODIUM CHLORIDE 0.9 % IV BOLUS (SEPSIS)
500.0000 mL | Freq: Once | INTRAVENOUS | Status: AC
  Administered 2014-07-05: 500 mL via INTRAVENOUS

## 2014-07-05 MED ORDER — CARVEDILOL 6.25 MG PO TABS
6.2500 mg | ORAL_TABLET | Freq: Two times a day (BID) | ORAL | Status: DC
  Administered 2014-07-05 – 2014-07-10 (×10): 6.25 mg via ORAL
  Filled 2014-07-05 (×13): qty 1

## 2014-07-05 MED ORDER — OFF THE BEAT BOOK
Freq: Once | Status: AC
  Administered 2014-07-05: 02:00:00
  Filled 2014-07-05: qty 1

## 2014-07-05 MED ORDER — CETYLPYRIDINIUM CHLORIDE 0.05 % MT LIQD
7.0000 mL | Freq: Two times a day (BID) | OROMUCOSAL | Status: DC
  Administered 2014-07-05 – 2014-07-19 (×25): 7 mL via OROMUCOSAL

## 2014-07-05 MED ORDER — POTASSIUM CHLORIDE 10 MEQ/100ML IV SOLN
10.0000 meq | INTRAVENOUS | Status: AC
  Administered 2014-07-05 (×4): 10 meq via INTRAVENOUS
  Filled 2014-07-05 (×4): qty 100

## 2014-07-05 MED ORDER — DILTIAZEM HCL 100 MG IV SOLR
5.0000 mg/h | INTRAVENOUS | Status: DC
  Administered 2014-07-05: 5 mg/h via INTRAVENOUS
  Filled 2014-07-05 (×2): qty 100

## 2014-07-05 MED ORDER — ONDANSETRON HCL 4 MG/2ML IJ SOLN
INTRAMUSCULAR | Status: AC
  Filled 2014-07-05: qty 2

## 2014-07-05 MED ORDER — HEPARIN (PORCINE) IN NACL 100-0.45 UNIT/ML-% IJ SOLN
1000.0000 [IU]/h | INTRAMUSCULAR | Status: DC
  Administered 2014-07-05 – 2014-07-06 (×3): 1350 [IU]/h via INTRAVENOUS
  Administered 2014-07-08: 1250 [IU]/h via INTRAVENOUS
  Administered 2014-07-09: 1000 [IU]/h via INTRAVENOUS
  Filled 2014-07-05 (×6): qty 250

## 2014-07-05 MED ORDER — ONDANSETRON HCL 4 MG/2ML IJ SOLN
4.0000 mg | Freq: Four times a day (QID) | INTRAMUSCULAR | Status: DC | PRN
  Administered 2014-07-05 – 2014-07-07 (×4): 4 mg via INTRAVENOUS
  Filled 2014-07-05 (×3): qty 2

## 2014-07-05 NOTE — Progress Notes (Signed)
[facility] Progress Note     Subjective: Pt c/o N/V abdominal pain.  He says his distension has gone down.  The NG tube was clogged this morning and he threw up.  After that they were able to get 660mL output.  I was able to get another 442mL out while in the room and he has finished another canister.  The drainage is thick and keeps clogging the tube.  He feels much better now that the tube has been flushed.    Objective: Vital signs in last 24 hours: Temp:  [97.5 F (36.4 C)-98 F (36.7 C)] 97.7 F (36.5 C) (11/05 0900) Pulse Rate:  [72-140] 94 (11/05 0900) Resp:  [10-21] 20 (11/05 0900) BP: (86-115)/(54-92) 110/82 mmHg (11/05 0900) SpO2:  [87 %-94 %] 89 % (11/05 0900) Weight:  [198 lb 13.7 oz (90.2 kg)-200 lb (90.719 kg)] 198 lb 13.7 oz (90.2 kg) (11/05 0023) Last BM Date: 07/04/14  Intake/Output from previous day: 11/04 0701 - 11/05 0700 In: 876.8 [I.V.:876.8] Out: 1000 [Emesis/NG output:1000] Intake/Output this shift:    PE: Gen:  Alert, NAD, pleasant Abd: Soft, mild tenderness, distended, +BS, no HSM, midline abdominal scars noted, NG tube with feculent output at least 1047mL/24hrs, he continues to have a large amount coming out.     Lab Results:   Recent Labs  07/04/14 1858 07/05/14 0315  WBC 12.5* 11.1*  HGB 16.8 15.8  HCT 47.7 44.4  PLT 220 186   BMET  Recent Labs  07/04/14 1858 07/05/14 0315  NA 138 139  K 3.8 3.7  CL 96 97  CO2 22 27  GLUCOSE 135* 117*  BUN 24* 32*  CREATININE 1.37* 1.97*  CALCIUM 10.1 9.4   PT/INR No results for input(s): LABPROT, INR in the last 72 hours. CMP     Component Value Date/Time   NA 139 07/05/2014 0315   NA 143 07/03/2014 1024   K 3.7 07/05/2014 0315   K 3.9 07/03/2014 1024   CL 97 07/05/2014 0315   CL 109* 08/01/2012 1046   CO2 27 07/05/2014 0315   CO2 25 07/03/2014 1024   GLUCOSE 117* 07/05/2014 0315   GLUCOSE 96 07/03/2014 1024   GLUCOSE 93 08/01/2012 1046   BUN 32* 07/05/2014 0315    BUN 19.4 07/03/2014 1024   CREATININE 1.97* 07/05/2014 0315   CREATININE 1.0 07/03/2014 1024   CREATININE 1.00 08/17/2013 1250   CALCIUM 9.4 07/05/2014 0315   CALCIUM 9.3 07/03/2014 1024   PROT 7.1 07/05/2014 0315   PROT 7.2 07/03/2014 1024   ALBUMIN 3.2* 07/05/2014 0315   ALBUMIN 3.4* 07/03/2014 1024   AST 31 07/05/2014 0315   AST 29 07/03/2014 1024   ALT 24 07/05/2014 0315   ALT 25 07/03/2014 1024   ALKPHOS 136* 07/05/2014 0315   ALKPHOS 145 07/03/2014 1024   BILITOT 1.9* 07/05/2014 0315   BILITOT 1.33* 07/03/2014 1024   GFRNONAA 32* 07/05/2014 0315   GFRAA 38* 07/05/2014 0315   Lipase     Component Value Date/Time   LIPASE 26 07/04/2014 1858       Studies/Results: Ct Chest W Contrast  07/03/2014   CLINICAL DATA:  CHEST CT ORDERED FOR RESTAGING LUNG CANCER BY DR. Julien Nordmann.  CTA OF THE ABDOMEN AND PELVIS ORDERED BY DR. Oneida Alar FOR TRIPLE A REPAIR FOLLOW-UP.  EXAM: CT CHEST WITH CONTRAST  CT ABDOMEN AND PELVIS ANGIOGRAPHY WITH AND WITHOUT CONTRAST  TECHNIQUE: Multidetector CT imaging of the chest was performed during intravenous contrast administration. Multidetector  CT imaging of the abdomen and pelvis was performed following the standard protocol before and during bolus administration of intravenous contrast.  CONTRAST:  143mL OMNIPAQUE IOHEXOL 350 MG/ML SOLN  COMPARISON:  Abdominal CT 09/05/2013.  Chest CT 01/01/2014.  FINDINGS: CT CHEST FINDINGS  THORACIC INLET/BODY WALL:  Porta catheter from the left in stable position.  Atrophic or resected thyroid gland. No supraclavicular or axillary adenopathy.  MEDIASTINUM:  No cardiomegaly or pericardial effusion. Diffuse atherosclerosis, including the coronary arteries. No acute vascular findings.  No suspicious or enlarging mediastinal lymph nodes. Previously measured right juxta diaphragmatic node is smaller at 6 mm short axis. A upper prevascular node is stable at 6 mm short axis.  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 No soft tissue increase or nodular component suggestive of recurrent disease. Stable subpleural nodule along the right minor fissure measuring 9 mm. No new pulmonary nodule.  Panlobular and centrilobular emphysema. No pneumonia, edema, effusion, or pneumothorax.  UPPER ABDOMEN:  See below  OSSEOUS:  No acute fracture.  No suspicious lytic or blastic lesions.  CT ABDOMEN AND PELVIS FINDINGS  BODY WALL: There is an incisional hernia just above the umbilicus which contains small bowel and results in a partial small bowel obstruction.  Fatty right inguinal hernia which is small.  Liver: No focal abnormality.  Biliary: No evidence of biliary obstruction or stone.  Pancreas: Unremarkable.  Spleen: Unremarkable.  Adrenals: Unremarkable.  Kidneys and ureters: No hydronephrosis or stone.  Bladder: Unremarkable.  Reproductive: Enlarged prostate, deforming the bladder base. Morphology is stable from previous.  Bowel: There is dilated proximal small bowel leading to a transition point within the above described hernia sac. Some fluid is present within the relatively decompressed distal small bowel, and the colon is not emptied, compatible with partial obstruction. No evidence of bowel devascularization/necrosis. Negative appendix. Extensive distal colonic diverticulosis without active inflammation.  Retroperitoneum: No mass or adenopathy.  Peritoneum: No ascites or pneumoperitoneum.  Vascular: Aortic branching is standard. 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  Status post bypass of an abdominal aortic aneurysm to the bilateral external iliacs and left internal iliac arteries. The graft is widely patent. Reimplanted IMA is widely patent. Branches of the left hypogastric artery are patent. The new maximal infrarenal aortic diameter measures 31 mm. The new maximal left common iliac artery  diameter is 23 mm. The new maximal right common iliac artery diameter is 19 mm. These diameters include the collapsed aneurysm sac wall. The distal left high epigastric artery measures 15 mm in maximal diameter. No concerning periaortic fat infiltration or gas.  OSSEOUS: No acute abnormalities.  These results will be called to the ordering clinician or representative by the Radiologist Assistant, and communication documented in the PACS or zVision Dashboard.  IMPRESSION: 1. Incisional hernia containing small bowel. There is related partial small bowel obstruction. 2. No evidence of recurrent lung cancer. 3. Status post aortic and iliac aneurysm bypass. New baseline anatomy is described above.   Electronically Signed   By: Jorje Guild M.D.   On: 07/03/2014 14:18   Ct Angio Abd/pel W/ And/or W/o  07/03/2014   CLINICAL DATA:  CHEST CT ORDERED FOR RESTAGING LUNG CANCER BY DR. Julien Nordmann.  CTA OF THE ABDOMEN AND PELVIS ORDERED BY DR. Oneida Alar FOR TRIPLE A REPAIR FOLLOW-UP.  EXAM: CT CHEST WITH CONTRAST  CT ABDOMEN AND PELVIS ANGIOGRAPHY WITH  AND WITHOUT CONTRAST  TECHNIQUE: Multidetector CT imaging of the chest was performed during intravenous contrast administration. Multidetector CT imaging of the abdomen and pelvis was performed following the standard protocol before and during bolus administration of intravenous contrast.  CONTRAST:  184mL OMNIPAQUE IOHEXOL 350 MG/ML SOLN  COMPARISON:  Abdominal CT 09/05/2013.  Chest CT 01/01/2014.  FINDINGS: CT CHEST FINDINGS  THORACIC INLET/BODY WALL:  Porta catheter from the left in stable position.  Atrophic or resected thyroid gland. No supraclavicular or axillary adenopathy.  MEDIASTINUM:  No cardiomegaly or pericardial effusion. Diffuse atherosclerosis, including the coronary arteries. No acute vascular findings.  No suspicious or enlarging mediastinal lymph nodes. Previously measured right juxta diaphragmatic node is smaller at 6 mm short axis. A upper prevascular node is  stable at 6 mm short axis.  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 No soft tissue increase or nodular component suggestive of recurrent disease. Stable subpleural nodule along the right minor fissure measuring 9 mm. No new pulmonary nodule.  Panlobular and centrilobular emphysema. No pneumonia, edema, effusion, or pneumothorax.  UPPER ABDOMEN:  See below  OSSEOUS:  No acute fracture.  No suspicious lytic or blastic lesions.  CT ABDOMEN AND PELVIS FINDINGS  BODY WALL: There is an incisional hernia just above the umbilicus which contains small bowel and results in a partial small bowel obstruction.  Fatty right inguinal hernia which is small.  Liver: No focal abnormality.  Biliary: No evidence of biliary obstruction or stone.  Pancreas: Unremarkable.  Spleen: Unremarkable.  Adrenals: Unremarkable.  Kidneys and ureters: No hydronephrosis or stone.  Bladder: Unremarkable.  Reproductive: Enlarged prostate, deforming the bladder base. Morphology is stable from previous.  Bowel: There is dilated proximal small bowel leading to a transition point within the above described hernia sac. Some fluid is present within the relatively decompressed distal small bowel, and the colon is not emptied, compatible with partial obstruction. No evidence of bowel devascularization/necrosis. Negative appendix. Extensive distal colonic diverticulosis without active inflammation.  Retroperitoneum: No mass or adenopathy.  Peritoneum: No ascites or pneumoperitoneum.  Vascular: Aortic branching is standard. 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  Status post bypass of an abdominal aortic aneurysm to the bilateral external iliacs and left internal iliac arteries. The graft is widely patent. Reimplanted IMA is widely patent. Branches of the left hypogastric artery are patent. The new maximal infrarenal  aortic diameter measures 31 mm. The new maximal left common iliac artery diameter is 23 mm. The new maximal right common iliac artery diameter is 19 mm. These diameters include the collapsed aneurysm sac wall. The distal left high epigastric artery measures 15 mm in maximal diameter. No concerning periaortic fat infiltration or gas.  OSSEOUS: No acute abnormalities.  These results will be called to the ordering clinician or representative by the Radiologist Assistant, and communication documented in the PACS or zVision Dashboard.  IMPRESSION: 1. Incisional hernia containing small bowel. There is related partial small bowel obstruction. 2. No evidence of recurrent lung cancer. 3. Status post aortic and iliac aneurysm bypass. New baseline anatomy is described above.   Electronically Signed   By: Jorje Guild M.D.   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  May be able to perform hernia repair this admission if he is cleared by cardiology, but risk are higher with urgent repairs than if done electively.  Goal would be to try to get him over this BO conservatively and schedule elective surgery as an OP.    We will await cardiology evaluation. 2.  Encouraged ambulating OOB and IS 3.  SCD's and lovenox 4.  Cards and vascular on board 5.  Nurses need to be diligent to prevent the NG tube from clogging seeing as its very thick feculent output which easily clogs even his large bore NG tube.   6.  Will follow    LOS: 1 day    Joseph [name], Joseph [name] 07/05/2014, 9:54 AM Pager: [phone]

## 2014-07-05 NOTE — Progress Notes (Signed)
Pt able to ambulate approximately 100 ft,, HR increased 120-125/min, assisted back to bed , assisted back to bed , denies discomforts.. Voided with low urine output (see I&O flow sheet) Isaac Laud ,Cardiology PA made aware, Dr Brigitte Pulse also notified, Ns iv bolus 500 cc ordered , iv  rate increased  to 125/hr.

## 2014-07-05 NOTE — Progress Notes (Signed)
[place] for Heparin Indication: atrial fibrillation  No Known Allergies  Patient Measurements: Height: 6' (182.9 cm) Weight: 198 lb 13.7 oz (90.2 kg) IBW/kg (Calculated) : 77.6  Vital Signs: Temp: 97.7 F (36.5 C) (11/05 0543) Temp Source: Oral (11/05 0543) BP: 107/77 mmHg (11/05 0600) Pulse Rate: 94 (11/05 0600)  Labs:  Recent Labs  07/03/14 1024 07/04/14 1858 07/05/14 0315  HGB 15.4 16.8 15.8  HCT 46.4 47.7 44.4  PLT 209 220 186  CREATININE 1.0 1.37* 1.97*  TROPONINI  --  <0.30  --     Estimated Creatinine Clearance: 37.7 mL/min (by C-G formula based on Cr of 1.97).  Assessment: 73 yo male with Afib, possible surgical intervention for SBO, for heparin.  Lovenox 90 mg SQ given last night at 2200  Goal of Therapy:  Heparin level 0.3-0.7 Monitor platelets by anticoagulation protocol: Yes   Plan:  Start heparin 1350 units/hr at 1000 Check heparin level in 8 hours.  Phillis Knack, PharmD, BCPS

## 2014-07-05 NOTE — Plan of Care (Signed)
Problem: Phase I Progression Outcomes Goal: Heart rate or rhythm control medication Outcome: Completed/Met Date Met:  07/05/14  Problem: Phase II Progression Outcomes Goal: Pain controlled Outcome: Completed/Met Date Met:  07/05/14 Goal: Progress activity as tolerated unless otherwise ordered Outcome: Completed/Met Date Met:  07/05/14

## 2014-07-05 NOTE — Progress Notes (Addendum)
Received pt from ED at 00:19 am with HR=110's-130's Sinus tach. Cardizem gtt titrated down to 5mg /hr. At 3:40 am Cardizem stopped   as per Dr.Whitlock. At 5:15 am pt had projectile vomiting. Changed NGT setting to moderate regular to pull thicker drainage. Dr. Forde Dandy informed. Zofran 4 mg IV given as ordered. Changed back NGT to low intermittent suction. Will continue to monitor pt.

## 2014-07-05 NOTE — Progress Notes (Signed)
[place] for Heparin Indication: atrial fibrillation  No Known Allergies  Labs:  Recent Labs  07/03/14 1024 07/04/14 1858 07/05/14 0315 07/05/14 1930  HGB 15.4 16.8 15.8  --   HCT 46.4 47.7 44.4  --   PLT 209 220 186  --   HEPARINUNFRC  --   --   --  0.70  CREATININE 1.0 1.37* 1.97*  --   TROPONINI  --  <0.30  --   --     Estimated Creatinine Clearance: 37.7 mL/min (by C-G formula based on Cr of 1.97).  Assessment: 73 yo male with Afib, possible surgical intervention for SBO, for heparin.  Lovenox 90 mg SQ given last night at 2200  PM heparin level = 0.70  Goal of Therapy:  Heparin level 0.3-0.7 Monitor platelets by anticoagulation protocol: Yes   Plan:  Continue heparin at 1350 units / hr Follow up AM labs  Thank you. Anette Guarneri, PharmD [phone]

## 2014-07-05 NOTE — Plan of Care (Signed)
Problem: Consults Goal: Atrial Arhythmia Patient Education (See Patient Education module for education specifics.)  Outcome: Completed/Met Date Met:  07/05/14 Goal: Skin Care Protocol Initiated - if Braden Score 18 or less If consults are not indicated, leave blank or document N/A  Outcome: Completed/Met Date Met:  07/05/14  Problem: Phase I Progression Outcomes Goal: Ventricular heart rate < 120/min Outcome: Completed/Met Date Met:  07/05/14 Goal: Anticoagulation Therapy per MD order Outcome: Completed/Met Date Met:  07/05/14 Goal: Heart rate or rhythm control medication Outcome: Progressing Goal: Pain controlled with appropriate interventions Outcome: Completed/Met Date Met:  07/05/14 Goal: Hemodynamically stable Outcome: Completed/Met Date Met:  07/05/14

## 2014-07-05 NOTE — Progress Notes (Signed)
[facility]  Subjective  - does not feel well but abdomen improved   Objective 107/77 94 97.7 F (36.5 C) (Oral) 20 91%  Intake/Output Summary (Last 24 hours) at 07/05/14 0841 Last data filed at 07/05/14 0600  Gross per 24 hour  Intake 876.75 ml  Output   1000 ml  Net -123.25 ml   Abdomen soft no obvious incarceration he states hernia comes and goes  Assessment/Planning: Ventral hernia now symptomatic Dehydration Afib  Will follow with you Appreciate care assistance from other teams  Joseph [name] 07/05/2014 8:41 AM --  Laboratory Lab Results:  Recent Labs  07/04/14 1858 07/05/14 0315  WBC 12.5* 11.1*  HGB 16.8 15.8  HCT 47.7 44.4  PLT 220 186   BMET  Recent Labs  07/04/14 1858 07/05/14 0315  NA 138 139  K 3.8 3.7  CL 96 97  CO2 22 27  GLUCOSE 135* 117*  BUN 24* 32*  CREATININE 1.37* 1.97*  CALCIUM 10.1 9.4    COAG Lab Results  Component Value Date   INR 1.29 11/01/2013   INR 1.37 11/01/2013   INR 1.32 10/31/2013   No results found for: PTT

## 2014-07-05 NOTE — Progress Notes (Signed)
Utilization review completed.  

## 2014-07-05 NOTE — Progress Notes (Signed)
Patient Name: Joseph [name] Date of Encounter: 07/05/2014     Principal Problem:   Umbilical hernia, incarcerated Active Problems:   Hepatic cirrhosis   Atrial fibrillation with rapid ventricular response   Small bowel obstruction    SUBJECTIVE  Denies any significant SOB. Does have some chronic mild DOE. Denies significant CP. No cardiac awareness.   CURRENT MEDS . potassium chloride  10 mEq Intravenous Q1 Hr x 4  . sodium chloride  1,000 mL Intravenous Once    OBJECTIVE  Filed Vitals:   07/05/14 0500 07/05/14 0543 07/05/14 0600 07/05/14 0900  BP: 100/74 100/74 107/77 110/82  Pulse: 100 93 94 94  Temp:  97.7 F (36.5 C)  97.7 F (36.5 C)  TempSrc:  Oral  Oral  Resp: 20 20  20   Height:      Weight:      SpO2: 91% 90% 91% 89%    Intake/Output Summary (Last 24 hours) at 07/05/14 0937 Last data filed at 07/05/14 0600  Gross per 24 hour  Intake 876.75 ml  Output   1000 ml  Net -123.25 ml   Filed Weights   07/04/14 1839 07/05/14 0023  Weight: 200 lb (90.719 kg) 198 lb 13.7 oz (90.2 kg)    PHYSICAL EXAM  General: Pleasant, NAD. Neuro: Alert and oriented X 3. Moves all extremities spontaneously. Psych: Normal affect. HEENT:  Normal  Neck: Supple without bruits or JVD. Lungs:  Resp regular and unlabored, CTA. Heart: tachycardic no s3, s4, or murmurs. Abdomen: Soft, non-tender, non-distended, BS + x 4.  Extremities: No clubbing, cyanosis or edema. DP/PT/Radials 2+ and equal bilaterally.  Accessory Clinical Findings  CBC  Recent Labs  07/03/14 1024 07/04/14 1858 07/05/14 0315  WBC 7.1 12.5* 11.1*  NEUTROABS 4.6 10.3*  --   HGB 15.4 16.8 15.8  HCT 46.4 47.7 44.4  MCV 98.9* 93.9 96.1  PLT 209 220 616   Basic Metabolic Panel  Recent Labs  07/04/14 1858 07/05/14 0315  NA 138 139  K 3.8 3.7  CL 96 97  CO2 22 27  GLUCOSE 135* 117*  BUN 24* 32*  CREATININE 1.37* 1.97*  CALCIUM 10.1 9.4   Liver Function Tests  Recent Labs  07/04/14 1858 07/05/14 0315  AST 33 31  ALT 26 24  ALKPHOS 161* 136*  BILITOT 2.0* 1.9*  PROT 8.0 7.1  ALBUMIN 3.7 3.2*    Recent Labs  07/04/14 1858  LIPASE 26   Cardiac Enzymes  Recent Labs  07/04/14 1858  TROPONINI <0.30    TELE A-fib with HR 150s converted to sinus tach around 10:30 pm last night, now sinus tach with HR 90s    ECG  NSR with nonspecific ST T wave changes  Echocardiogram  LV EF: 55% -  60%  ------------------------------------------------------------ Indications:   V728.1 Pre-op evaluation. 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 Dyspnea 786.09.  ------------------------------------------------------------ History:  PMH: Emphysema. Acquired from the patient and from the patient's chart. Exertional dyspnea. Known aortic aneurysm. Chronic obstructive pulmonary disease. The patient has a lung malignancy and is status postchemotherapy. Risk factors: Former tobacco use. Dyslipidemia.  ------------------------------------------------------------ Study Conclusions  - Left ventricle: The cavity size was normal. Systolic function was normal. The estimated ejection fraction was in the range of 55% to 60%. Wall motion was normal; there were no regional wall motion abnormalities. There was an increased relative contribution of atrial contraction to ventricular filling. Doppler parameters are consistent with abnormal left ventricular relaxation (grade 1 diastolic dysfunction). - Aortic  valve: Moderate diffuse thickening and calcification, consistent with sclerosis. Trivial regurgitation. - Pulmonary arteries: PA peak pressure: 41mm Hg (S). Impressions:  - The right ventricular systolic pressure was increased consistent with mild pulmonary hypertension.    Radiology/Studies  Ct Chest W Contrast  07/03/2014   CLINICAL DATA:  CHEST CT ORDERED FOR RESTAGING LUNG CANCER BY DR. Julien Nordmann.  CTA OF THE ABDOMEN AND PELVIS  ORDERED BY DR. Oneida Alar FOR TRIPLE A REPAIR FOLLOW-UP.  EXAM: CT CHEST WITH CONTRAST  CT ABDOMEN AND PELVIS ANGIOGRAPHY WITH AND WITHOUT CONTRAST  TECHNIQUE: Multidetector CT imaging of the chest was performed during intravenous contrast administration. Multidetector CT imaging of the abdomen and pelvis was performed following the standard protocol before and during bolus administration of intravenous contrast.  CONTRAST:  133mL OMNIPAQUE IOHEXOL 350 MG/ML SOLN  COMPARISON:  Abdominal CT 09/05/2013.  Chest CT 01/01/2014.  FINDINGS: CT CHEST FINDINGS  THORACIC INLET/BODY WALL:  Porta catheter from the left in stable position.  Atrophic or resected thyroid gland. No supraclavicular or axillary adenopathy.  MEDIASTINUM:  No cardiomegaly or pericardial effusion. Diffuse atherosclerosis, including the coronary arteries. No acute vascular findings.  No suspicious or enlarging mediastinal lymph nodes. Previously measured right juxta diaphragmatic node is smaller at 6 mm short axis. A upper prevascular node is stable at 6 mm short axis.  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 No soft tissue increase or nodular component suggestive of recurrent disease. Stable subpleural nodule along the right minor fissure measuring 9 mm. No new pulmonary nodule.  Panlobular and centrilobular emphysema. No pneumonia, edema, effusion, or pneumothorax.  UPPER ABDOMEN:  See below  OSSEOUS:  No acute fracture.  No suspicious lytic or blastic lesions.  CT ABDOMEN AND PELVIS FINDINGS  BODY WALL: There is an incisional hernia just above the umbilicus which contains small bowel and results in a partial small bowel obstruction.  Fatty right inguinal hernia which is small.  Liver: No focal abnormality.  Biliary: No evidence of biliary obstruction or stone.  Pancreas: Unremarkable.  Spleen: Unremarkable.  Adrenals: Unremarkable.  Kidneys and ureters: No  hydronephrosis or stone.  Bladder: Unremarkable.  Reproductive: Enlarged prostate, deforming the bladder base. Morphology is stable from previous.  Bowel: There is dilated proximal small bowel leading to a transition point within the above described hernia sac. Some fluid is present within the relatively decompressed distal small bowel, and the colon is not emptied, compatible with partial obstruction. No evidence of bowel devascularization/necrosis. Negative appendix. Extensive distal colonic diverticulosis without active inflammation.  Retroperitoneum: No mass or adenopathy.  Peritoneum: No ascites or pneumoperitoneum.  Vascular: Aortic branching is standard. 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  Status post bypass of an abdominal aortic aneurysm to the bilateral external iliacs and left internal iliac arteries. The graft is widely patent. Reimplanted IMA is widely patent. Branches of the left hypogastric artery are patent. The new maximal infrarenal aortic diameter measures 31 mm. The new maximal left common iliac artery diameter is 23 mm. The new maximal right common iliac artery diameter is 19 mm. These diameters include the collapsed aneurysm sac wall. The distal left high epigastric artery measures 15 mm in maximal diameter. No concerning periaortic fat infiltration or gas.  OSSEOUS: No acute abnormalities.  These results will be called to the ordering clinician or representative by the Radiologist Assistant, and communication documented in the PACS or  zVision Dashboard.  IMPRESSION: 1. Incisional hernia containing small bowel. There is related partial small bowel obstruction. 2. No evidence of recurrent lung cancer. 3. Status post aortic and iliac aneurysm bypass. New baseline anatomy is described above.   Electronically Signed   By: Jorje Guild M.D.   On: 07/03/2014 14:18   US Abdomen Complete  06/19/2014   CLINICAL DATA:  Upper abdominal pain.   Cirrhosis of the liver.  EXAM: ULTRASOUND ABDOMEN COMPLETE  COMPARISON:  09/05/2013  FINDINGS: Gallbladder: No gallstones or wall thickening visualized. No sonographic Murphy sign noted.  Common bile duct: Diameter: Normal caliber, 4 mm  Liver: Coarsened echotexture with nodular contours compatible with cirrhosis. No focal abnormality.  IVC: No abnormality visualized.  Pancreas: Visualized portion unremarkable.  Spleen: Size and appearance within normal limits.  Right Kidney: Length: 11.8 cm. Echogenicity within normal limits. No mass or hydronephrosis visualized.  Left Kidney: Length: 12.4 cm. Echogenicity within normal limits. No mass or hydronephrosis visualized.  Abdominal aorta: No aneurysm visualized.  Other findings: None.  IMPRESSION: Changes of cirrhosis. No focal hepatic abnormality. No acute findings.   Electronically Signed   By: Rolm Baptise M.D.   On: 06/19/2014 08:25   Ct Angio Abd/pel W/ And/or W/o  07/03/2014   CLINICAL DATA:  CHEST CT ORDERED FOR RESTAGING LUNG CANCER BY DR. Julien Nordmann.  CTA OF THE ABDOMEN AND PELVIS ORDERED BY DR. Oneida Alar FOR TRIPLE A REPAIR FOLLOW-UP.  EXAM: CT CHEST WITH CONTRAST  CT ABDOMEN AND PELVIS ANGIOGRAPHY WITH AND WITHOUT CONTRAST  TECHNIQUE: Multidetector CT imaging of the chest was performed during intravenous contrast administration. Multidetector CT imaging of the abdomen and pelvis was performed following the standard protocol before and during bolus administration of intravenous contrast.  CONTRAST:  152mL OMNIPAQUE IOHEXOL 350 MG/ML SOLN  COMPARISON:  Abdominal CT 09/05/2013.  Chest CT 01/01/2014.  FINDINGS: CT CHEST FINDINGS  THORACIC INLET/BODY WALL:  Porta catheter from the left in stable position.  Atrophic or resected thyroid gland. No supraclavicular or axillary adenopathy.  MEDIASTINUM:  No cardiomegaly or pericardial effusion. Diffuse atherosclerosis, including the coronary arteries. No acute vascular findings.  No suspicious or enlarging mediastinal lymph  nodes. Previously measured right juxta diaphragmatic node is smaller at 6 mm short axis. A upper prevascular node is stable at 6 mm short axis.  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 No soft tissue increase or nodular component suggestive of recurrent disease. Stable subpleural nodule along the right minor fissure measuring 9 mm. No new pulmonary nodule.  Panlobular and centrilobular emphysema. No pneumonia, edema, effusion, or pneumothorax.  UPPER ABDOMEN:  See below  OSSEOUS:  No acute fracture.  No suspicious lytic or blastic lesions.  CT ABDOMEN AND PELVIS FINDINGS  BODY WALL: There is an incisional hernia just above the umbilicus which contains small bowel and results in a partial small bowel obstruction.  Fatty right inguinal hernia which is small.  Liver: No focal abnormality.  Biliary: No evidence of biliary obstruction or stone.  Pancreas: Unremarkable.  Spleen: Unremarkable.  Adrenals: Unremarkable.  Kidneys and ureters: No hydronephrosis or stone.  Bladder: Unremarkable.  Reproductive: Enlarged prostate, deforming the bladder base. Morphology is stable from previous.  Bowel: There is dilated proximal small bowel leading to a transition point within the above described hernia sac. Some fluid is present within the relatively decompressed distal small bowel, and the colon is not emptied, compatible with partial obstruction. No evidence  of bowel devascularization/necrosis. Negative appendix. Extensive distal colonic diverticulosis without active inflammation.  Retroperitoneum: No mass or adenopathy.  Peritoneum: No ascites or pneumoperitoneum.  Vascular: Aortic branching is standard. 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  Status post bypass of an abdominal aortic aneurysm to the bilateral external iliacs and left internal iliac arteries. The graft is widely patent.  Reimplanted IMA is widely patent. Branches of the left hypogastric artery are patent. The new maximal infrarenal aortic diameter measures 31 mm. The new maximal left common iliac artery diameter is 23 mm. The new maximal right common iliac artery diameter is 19 mm. These diameters include the collapsed aneurysm sac wall. The distal left high epigastric artery measures 15 mm in maximal diameter. No concerning periaortic fat infiltration or gas.  OSSEOUS: No acute abnormalities.  These results will be called to the ordering clinician or representative by the Radiologist Assistant, and communication documented in the PACS or zVision Dashboard.  IMPRESSION: 1. Incisional hernia containing small bowel. There is related partial small bowel obstruction. 2. No evidence of recurrent lung cancer. 3. Status post aortic and iliac aneurysm bypass. New baseline anatomy is described above.   Electronically Signed   By: Jorje Guild M.D.   On: 07/03/2014 14:18    ASSESSMENT AND PLAN 73 y.o. male w/ PMHx significant for PVD s/p aorta to iliac bypass 10/2013, nonobs CAD by cath remotely, lung CA s/p chemo/rad 2012, HTN, hyperlipidemia and HFpEF present with N/V and found to have small bowel obstruction 2/2 incarceration. He was also noted to have a-fib with RVR  1. A-fib with RVR, unkown duration. Appear to have converted to sinus tach around 10:25 last night  - Echo 10/2013 EF 10-25%, grade 1 diastolic dysfunct, moderately calcified aortic valve, PA peak pressure 35 mmHg  - TSH WNL  - CHADS2Vasc score is 3 (age, CHF, PVD) need long term systemic anticoagulation therapy once recover from surgery  - continue IV heparin and diltiazem.   - per surgery, need cardiac clearance, but appears patient need this surgery. Denies ever have significant HF symptom. No obvious need to repeat echo during this admission. Will discuss with Dr. Johnsie Cancel regarding preop clearance  2. Ventral hernia with small bowel incarceration  3. Small  bowel obstruction 2/2 #2  4. H/o Lung CA: no recurrence noted on CT  5. Dehydration  - h/o HFpEF, at risk for fluid overload. Monitor fluid status   6. AKI: continue to worsen (1.0 --> 1.9), getting fluid hydration   Weston Brass Woodward Ku Pager: [phone]  Patient examined chart reviewed.  Cleared for AAA surgery by Dr Meda Coffee earlier in year.  Had 9 hour vascular surgery with no cardiac complications.  Admitted with nausea vomiting and abdominal pain from ventral hernia.  NG tube in now. Admission complicated by new onset afib now converted to NSR with cardizem  Clear to have ventral hernia surgery.  No chest pain. Distant cath [facility] no CAD.  Continue beta blocker and start iv amiodarone 24-48 hrs before surgery to lessen chance of recurrent PAF  Jenkins Rouge

## 2014-07-05 NOTE — Progress Notes (Signed)
Subjective: Reviewed recent events with patient- 3-4 weeks of increased abdominal pain, bloating, and intermittent diarrhea/constipation.  Noted hernia 2 weeks ago which has generated pain when it protrudes and improves when it reduces.  N/V over past several days with worsening symptoms.  In Afib on presentation to ER for surgical evaluation.  Converted to Sinus Tach overnight.  NG tube placed.  Mild abdominal soreness currently.  No flatulence  Objective: Vital signs in last 24 hours: Temp:  [97.5 F (36.4 C)-98 F (36.7 C)] 97.7 F (36.5 C) (11/05 0543) Pulse Rate:  [72-140] 94 (11/05 0600) Resp:  [10-21] 20 (11/05 0543) BP: (86-115)/(54-92) 107/77 mmHg (11/05 0600) SpO2:  [87 %-94 %] 91 % (11/05 0600) Weight:  [90.2 kg (198 lb 13.7 oz)-90.719 kg (200 lb)] 90.2 kg (198 lb 13.7 oz) (11/05 0023) Weight change:     CBG (last 3)  No results for input(s): GLUCAP in the last 72 hours.  Intake/Output from previous day: 11/04 0701 - 11/05 0700 In: 876.8 [I.V.:876.8] Out: 1000 [Emesis/NG output:1000] Intake/Output this shift:    General appearance: alert and fatigued; NG in place with brown suction Eyes: no scleral icterus Throat: oropharynx moist without erythema Resp: clear to auscultation bilaterally Cardio: tachycardic, regular GI: slightly distended with absent bowel sounds; right lateral umbilical hernia increases with valsalva and sit-up; mildly tender Extremities: no clubbing, cyanosis or edema   Lab Results:  Recent Labs  07/04/14 1858 07/05/14 0315  NA 138 139  K 3.8 3.7  CL 96 97  CO2 22 27  GLUCOSE 135* 117*  BUN 24* 32*  CREATININE 1.37* 1.97*  CALCIUM 10.1 9.4    Recent Labs  07/04/14 1858 07/05/14 0315  AST 33 31  ALT 26 24  ALKPHOS 161* 136*  BILITOT 2.0* 1.9*  PROT 8.0 7.1  ALBUMIN 3.7 3.2*    Recent Labs  07/03/14 1024 07/04/14 1858 07/05/14 0315  WBC 7.1 12.5* 11.1*  NEUTROABS 4.6 10.3*  --   HGB 15.4 16.8 15.8  HCT 46.4 47.7 44.4   MCV 98.9* 93.9 96.1  PLT 209 220 186   Lab Results  Component Value Date   INR 1.29 11/01/2013   INR 1.37 11/01/2013   INR 1.32 10/31/2013    Recent Labs  07/04/14 1858  TROPONINI <0.30   No results for input(s): TSH, T4TOTAL, T3FREE, THYROIDAB in the last 72 hours.  Invalid input(s): FREET3 No results for input(s): VITAMINB12, FOLATE, FERRITIN, TIBC, IRON, RETICCTPCT in the last 72 hours.  Studies/Results: Ct Chest W Contrast  07/03/2014   CLINICAL DATA:  CHEST CT ORDERED FOR RESTAGING LUNG CANCER BY DR. Julien Nordmann.  CTA OF THE ABDOMEN AND PELVIS ORDERED BY DR. Oneida Alar FOR TRIPLE A REPAIR FOLLOW-UP.  EXAM: CT CHEST WITH CONTRAST  CT ABDOMEN AND PELVIS ANGIOGRAPHY WITH AND WITHOUT CONTRAST  TECHNIQUE: Multidetector CT imaging of the chest was performed during intravenous contrast administration. Multidetector CT imaging of the abdomen and pelvis was performed following the standard protocol before and during bolus administration of intravenous contrast.  CONTRAST:  168mL OMNIPAQUE IOHEXOL 350 MG/ML SOLN  COMPARISON:  Abdominal CT 09/05/2013.  Chest CT 01/01/2014.  FINDINGS: CT CHEST FINDINGS  THORACIC INLET/BODY WALL:  Porta catheter from the left in stable position.  Atrophic or resected thyroid gland. No supraclavicular or axillary adenopathy.  MEDIASTINUM:  No cardiomegaly or pericardial effusion. Diffuse atherosclerosis, including the coronary arteries. No acute vascular findings.  No suspicious or enlarging mediastinal lymph nodes. Previously measured right juxta diaphragmatic node is smaller  at 6 mm short axis. A upper prevascular node is stable at 6 mm short axis.  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 No soft tissue increase or nodular component suggestive of recurrent disease. Stable subpleural nodule along the right minor fissure measuring 9 mm. No new pulmonary nodule.  Panlobular and  centrilobular emphysema. No pneumonia, edema, effusion, or pneumothorax.  UPPER ABDOMEN:  See below  OSSEOUS:  No acute fracture.  No suspicious lytic or blastic lesions.  CT ABDOMEN AND PELVIS FINDINGS  BODY WALL: There is an incisional hernia just above the umbilicus which contains small bowel and results in a partial small bowel obstruction.  Fatty right inguinal hernia which is small.  Liver: No focal abnormality.  Biliary: No evidence of biliary obstruction or stone.  Pancreas: Unremarkable.  Spleen: Unremarkable.  Adrenals: Unremarkable.  Kidneys and ureters: No hydronephrosis or stone.  Bladder: Unremarkable.  Reproductive: Enlarged prostate, deforming the bladder base. Morphology is stable from previous.  Bowel: There is dilated proximal small bowel leading to a transition point within the above described hernia sac. Some fluid is present within the relatively decompressed distal small bowel, and the colon is not emptied, compatible with partial obstruction. No evidence of bowel devascularization/necrosis. Negative appendix. Extensive distal colonic diverticulosis without active inflammation.  Retroperitoneum: No mass or adenopathy.  Peritoneum: No ascites or pneumoperitoneum.  Vascular: Aortic branching is standard. 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  Status post bypass of an abdominal aortic aneurysm to the bilateral external iliacs and left internal iliac arteries. The graft is widely patent. Reimplanted IMA is widely patent. Branches of the left hypogastric artery are patent. The new maximal infrarenal aortic diameter measures 31 mm. The new maximal left common iliac artery diameter is 23 mm. The new maximal right common iliac artery diameter is 19 mm. These diameters include the collapsed aneurysm sac wall. The distal left high epigastric artery measures 15 mm in maximal diameter. No concerning periaortic fat infiltration or gas.  OSSEOUS: No acute  abnormalities.  These results will be called to the ordering clinician or representative by the Radiologist Assistant, and communication documented in the PACS or zVision Dashboard.  IMPRESSION: 1. Incisional hernia containing small bowel. There is related partial small bowel obstruction. 2. No evidence of recurrent lung cancer. 3. Status post aortic and iliac aneurysm bypass. New baseline anatomy is described above.   Electronically Signed   By: Jorje Guild M.D.   On: 07/03/2014 14:18   Ct Angio Abd/pel W/ And/or W/o  07/03/2014   CLINICAL DATA:  CHEST CT ORDERED FOR RESTAGING LUNG CANCER BY DR. Julien Nordmann.  CTA OF THE ABDOMEN AND PELVIS ORDERED BY DR. Oneida Alar FOR TRIPLE A REPAIR FOLLOW-UP.  EXAM: CT CHEST WITH CONTRAST  CT ABDOMEN AND PELVIS ANGIOGRAPHY WITH AND WITHOUT CONTRAST  TECHNIQUE: Multidetector CT imaging of the chest was performed during intravenous contrast administration. Multidetector CT imaging of the abdomen and pelvis was performed following the standard protocol before and during bolus administration of intravenous contrast.  CONTRAST:  177mL OMNIPAQUE IOHEXOL 350 MG/ML SOLN  COMPARISON:  Abdominal CT 09/05/2013.  Chest CT 01/01/2014.  FINDINGS: CT CHEST FINDINGS  THORACIC INLET/BODY WALL:  Porta catheter from the left in stable position.  Atrophic or resected thyroid gland. No supraclavicular or axillary adenopathy.  MEDIASTINUM:  No cardiomegaly or pericardial effusion. Diffuse atherosclerosis, including the coronary arteries. No acute  vascular findings.  No suspicious or enlarging mediastinal lymph nodes. Previously measured right juxta diaphragmatic node is smaller at 6 mm short axis. A upper prevascular node is stable at 6 mm short axis.  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 No soft tissue increase or nodular component suggestive of recurrent disease. Stable subpleural nodule along the right minor  fissure measuring 9 mm. No new pulmonary nodule.  Panlobular and centrilobular emphysema. No pneumonia, edema, effusion, or pneumothorax.  UPPER ABDOMEN:  See below  OSSEOUS:  No acute fracture.  No suspicious lytic or blastic lesions.  CT ABDOMEN AND PELVIS FINDINGS  BODY WALL: There is an incisional hernia just above the umbilicus which contains small bowel and results in a partial small bowel obstruction.  Fatty right inguinal hernia which is small.  Liver: No focal abnormality.  Biliary: No evidence of biliary obstruction or stone.  Pancreas: Unremarkable.  Spleen: Unremarkable.  Adrenals: Unremarkable.  Kidneys and ureters: No hydronephrosis or stone.  Bladder: Unremarkable.  Reproductive: Enlarged prostate, deforming the bladder base. Morphology is stable from previous.  Bowel: There is dilated proximal small bowel leading to a transition point within the above described hernia sac. Some fluid is present within the relatively decompressed distal small bowel, and the colon is not emptied, compatible with partial obstruction. No evidence of bowel devascularization/necrosis. Negative appendix. Extensive distal colonic diverticulosis without active inflammation.  Retroperitoneum: No mass or adenopathy.  Peritoneum: No ascites or pneumoperitoneum.  Vascular: Aortic branching is standard. 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  Status post bypass of an abdominal aortic aneurysm to the bilateral external iliacs and left internal iliac arteries. The graft is widely patent. Reimplanted IMA is widely patent. Branches of the left hypogastric artery are patent. The new maximal infrarenal aortic diameter measures 31 mm. The new maximal left common iliac artery diameter is 23 mm. The new maximal right common iliac artery diameter is 19 mm. These diameters include the collapsed aneurysm sac wall. The distal left high epigastric artery measures 15 mm in maximal diameter. No  concerning periaortic fat infiltration or gas.  OSSEOUS: No acute abnormalities.  These results will be called to the ordering clinician or representative by the Radiologist Assistant, and communication documented in the PACS or zVision Dashboard.  IMPRESSION: 1. Incisional hernia containing small bowel. There is related partial small bowel obstruction. 2. No evidence of recurrent lung cancer. 3. Status post aortic and iliac aneurysm bypass. New baseline anatomy is described above.   Electronically Signed   By: Jorje Guild M.D.   On: 07/03/2014 14:18     Medications: Scheduled: . enoxaparin (LOVENOX) injection  1 mg/kg Subcutaneous Q12H  . sodium chloride  1,000 mL Intravenous Once   Continuous: . sodium chloride 100 mL/hr at 07/05/14 0600  . diltiazem (CARDIZEM) infusion Stopped (07/05/14 0340)    Assessment/Plan: Principal Problem: 1. Small bowel obstruction secodnary to Umbilical hernia, incarcerated- continue NG suction, NPO status- anticipate surgical management.  Postoperative primary management will be per General Surgery. Active Problems: 2. Atrial fibrillation with rapid ventricular response- secondary to acute stress related to #1 and volume depletion- has converted to Sinus Tachycardia. Cardizem drip on hold.  Change Lovenox to Heparin drip for flexibility perioperatively.  Long term anticoagulation per Cardiology as this may be an isolated event.  Replete KCL to keep K ~4.0. 3. Acute Renal Failure- secondary to volume  depletion.  Monitor with hydration.   4. Hepatic cirrhosis- monitor LFTs.  No evidence of ascites on CT. 5. Lung Cancer- no recurrence noted on CT 6. Disposition- per general surgery.     LOS: 1 day   [name] 07/05/2014, 7:22 AM

## 2014-07-06 DIAGNOSIS — J449 Chronic obstructive pulmonary disease, unspecified: Secondary | ICD-10-CM | POA: Diagnosis present

## 2014-07-06 DIAGNOSIS — E039 Hypothyroidism, unspecified: Secondary | ICD-10-CM | POA: Diagnosis present

## 2014-07-06 LAB — COMPREHENSIVE METABOLIC PANEL
ALK PHOS: 105 U/L (ref 39–117)
ALT: 21 U/L (ref 0–53)
ANION GAP: 13 (ref 5–15)
AST: 27 U/L (ref 0–37)
Albumin: 2.8 g/dL — ABNORMAL LOW (ref 3.5–5.2)
BILIRUBIN TOTAL: 1.3 mg/dL — AB (ref 0.3–1.2)
BUN: 40 mg/dL — AB (ref 6–23)
CHLORIDE: 105 meq/L (ref 96–112)
CO2: 27 meq/L (ref 19–32)
Calcium: 8.7 mg/dL (ref 8.4–10.5)
Creatinine, Ser: 1.49 mg/dL — ABNORMAL HIGH (ref 0.50–1.35)
GFR calc non Af Amer: 45 mL/min — ABNORMAL LOW (ref >90)
GFR, EST AFRICAN AMERICAN: 53 mL/min — AB (ref >90)
GLUCOSE: 89 mg/dL (ref 70–99)
POTASSIUM: 4.4 meq/L (ref 3.7–5.3)
SODIUM: 145 meq/L (ref 137–147)
Total Protein: 6.2 g/dL (ref 6.0–8.3)

## 2014-07-06 LAB — CBC
HEMATOCRIT: 40.3 % (ref 39.0–52.0)
HEMOGLOBIN: 13.7 g/dL (ref 13.0–17.0)
MCH: 33.2 pg (ref 26.0–34.0)
MCHC: 34 g/dL (ref 30.0–36.0)
MCV: 97.6 fL (ref 78.0–100.0)
Platelets: 149 10*3/uL — ABNORMAL LOW (ref 150–400)
RBC: 4.13 MIL/uL — AB (ref 4.22–5.81)
RDW: 14.2 % (ref 11.5–15.5)
WBC: 7.6 10*3/uL (ref 4.0–10.5)

## 2014-07-06 LAB — HEPARIN LEVEL (UNFRACTIONATED): Heparin Unfractionated: 0.64 IU/mL (ref 0.30–0.70)

## 2014-07-06 MED ORDER — LEVALBUTEROL HCL 0.63 MG/3ML IN NEBU
0.6300 mg | INHALATION_SOLUTION | Freq: Four times a day (QID) | RESPIRATORY_TRACT | Status: DC | PRN
  Filled 2014-07-06: qty 3

## 2014-07-06 MED ORDER — LEVOTHYROXINE SODIUM 100 MCG IV SOLR
88.0000 ug | Freq: Every day | INTRAVENOUS | Status: DC
  Administered 2014-07-06 – 2014-07-16 (×10): 88 ug via INTRAVENOUS
  Filled 2014-07-06 (×13): qty 5

## 2014-07-06 MED ORDER — FENTANYL CITRATE 0.05 MG/ML IJ SOLN
25.0000 ug | INTRAMUSCULAR | Status: DC | PRN
  Administered 2014-07-07 (×2): 50 ug via INTRAVENOUS
  Filled 2014-07-06 (×2): qty 2

## 2014-07-06 MED ORDER — LEVOTHYROXINE SODIUM 175 MCG PO TABS
175.0000 ug | ORAL_TABLET | Freq: Every day | ORAL | Status: DC
  Filled 2014-07-06 (×2): qty 1

## 2014-07-06 MED ORDER — LEVOTHYROXINE SODIUM 100 MCG IV SOLR: 100.0000 ug | Freq: Every day | INTRAVENOUS | Status: DC

## 2014-07-06 MED ORDER — OFF THE BEAT BOOK
Freq: Once | Status: AC
  Administered 2014-07-06: 16:00:00
  Filled 2014-07-06: qty 1

## 2014-07-06 MED ORDER — TIOTROPIUM BROMIDE MONOHYDRATE 18 MCG IN CAPS
18.0000 ug | ORAL_CAPSULE | Freq: Every day | RESPIRATORY_TRACT | Status: DC
  Administered 2014-07-06 – 2014-07-19 (×12): 18 ug via RESPIRATORY_TRACT
  Filled 2014-07-06 (×3): qty 5

## 2014-07-06 NOTE — Progress Notes (Signed)
Subjective: Reports some improvement in abdominal pain and distention.  +Flatus.  No BM.  NG remains in place with brown drainage.  No N/V.  Breathing is stable.  Remains in NSR with rates in 80s.  Objective: Vital signs in last 24 hours: Temp:  [97.7 F (36.5 C)-98.5 F (36.9 C)] 98.5 F (36.9 C) (11/06 0758) Pulse Rate:  [68-104] 87 (11/06 0800) Resp:  [18-20] 18 (11/06 0758) BP: (90-119)/(67-77) 112/72 mmHg (11/06 0800) SpO2:  [94 %-96 %] 95 % (11/06 0800) Weight:  [91.3 kg (201 lb 4.5 oz)] 91.3 kg (201 lb 4.5 oz) (11/05 2347) Weight change: 0.581 kg (1 lb 4.5 oz) Last BM Date: 07/04/14  CBG (last 3)  No results for input(s): GLUCAP in the last 72 hours.  Intake/Output from previous day: 11/05 0701 - 11/06 0700 In: 4016.8 [I.V.:3116.8; IV Piggyback:900] Out: 1870 [Urine:400; Emesis/NG output:1470] Intake/Output this shift:    General appearance: alert and no distress Eyes: no scleral icterus Throat: oropharynx moist without erythema Resp: clear to auscultation bilaterally Cardio: regular rate and rhythm GI: slightly distended with proximal umbilical hernia which is protruding; mild tenderness; minimal bowel sounds. Extremities: no clubbing, cyanosis or edema   Lab Results:  Recent Labs  07/05/14 0315 07/06/14 0346  NA 139 145  K 3.7 4.4  CL 97 105  CO2 27 27  GLUCOSE 117* 89  BUN 32* 40*  CREATININE 1.97* 1.49*  CALCIUM 9.4 8.7    Recent Labs  07/05/14 0315 07/06/14 0346  AST 31 27  ALT 24 21  ALKPHOS 136* 105  BILITOT 1.9* 1.3*  PROT 7.1 6.2  ALBUMIN 3.2* 2.8*    Recent Labs  07/03/14 1024  07/04/14 1858 07/05/14 0315 07/06/14 0346  WBC 7.1  < > 12.5* 11.1* 7.6  NEUTROABS 4.6  --  10.3*  --   --   HGB 15.4  < > 16.8 15.8 13.7  HCT 46.4  < > 47.7 44.4 40.3  MCV 98.9*  < > 93.9 96.1 97.6  PLT 209  < > 220 186 149*  < > = values in this interval not displayed. Lab Results  Component Value Date   INR 1.29 11/01/2013   INR 1.37 11/01/2013    INR 1.32 10/31/2013    Recent Labs  07/04/14 1858  TROPONINI <0.30   No results for input(s): TSH, T4TOTAL, T3FREE, THYROIDAB in the last 72 hours.  Invalid input(s): FREET3 No results for input(s): VITAMINB12, FOLATE, FERRITIN, TIBC, IRON, RETICCTPCT in the last 72 hours.  Studies/Results: No results found.   Medications: Scheduled: . antiseptic oral rinse  7 mL Mouth Rinse BID  . carvedilol  6.25 mg Oral BID WC  . levothyroxine  88 mcg Intravenous Daily  . tiotropium  18 mcg Inhalation Daily   Continuous: . sodium chloride 125 mL/hr at 07/05/14 2351  . heparin 1,350 Units/hr (07/06/14 0015)    Assessment/Plan: Principal Problem: 1. Small bowel obstruction secondary to Umbilical hernia, incarcerated- continue NG suction, NPO status- Patient is stable for surgery per Cardiology with no need for additional preoperative testing.  Pulmonary status stable.  OK to proceed with surgery when recommended by [facility].  I am concerned he will have recurrent issues if surgery is delayed as he is describing progressive symptoms over the past 4-6 weeks culminating in SBO.  Postoperative primary management per General Surgery. Active Problems: 2. Atrial fibrillation with rapid ventricular response- secondary to acute stress related to #1 and volume depletion- remains in Sinus Rhythm with rate  improved with hydration and Coreg. Long term anticoagulation per Cardiology as this may be an isolated event. Keep K ~4.0. 3. Acute Renal Failure- improving with hydration.  4. Hepatic cirrhosis- LFTs improved. No evidence of ascites on CT. 5. Lung Cancer- no recurrence noted on CT 6. Hypothyroidism- Synthroid restarted at 1/2 dose IV- resume 115mcg po when tolerating pos. 7. COPD- stable.  Restarted Spiriva and use Xopenex as needed (avoid albuterol due to AFib). 8. Disposition- per general surgery.    LOS: 2 days   Joseph [name] 07/06/2014, 9:01 AM

## 2014-07-06 NOTE — Progress Notes (Signed)
ANTICOAGULATION CONSULT NOTE - Follow Up Consult  Pharmacy Consult for heparin Indication: atrial fibrillation  No Known Allergies  Patient Measurements: Height: 6' (182.9 cm) Weight: 201 lb 4.5 oz (91.3 kg) IBW/kg (Calculated) : 77.6 Heparin Dosing Weight:   Vital Signs: Temp: 98.5 F (36.9 C) (11/06 0758) Temp Source: Oral (11/06 0758) BP: 112/72 mmHg (11/06 0800) Pulse Rate: 87 (11/06 0800)  Labs:  Recent Labs  07/04/14 1858 07/05/14 0315 07/05/14 1930 07/06/14 0346  HGB 16.8 15.8  --  13.7  HCT 47.7 44.4  --  40.3  PLT 220 186  --  149*  HEPARINUNFRC  --   --  0.70 0.64  CREATININE 1.37* 1.97*  --  1.49*  TROPONINI <0.30  --   --   --     Estimated Creatinine Clearance: 49.9 mL/min (by C-G formula based on Cr of 1.49).   Medications:  Scheduled:  . antiseptic oral rinse  7 mL Mouth Rinse BID  . carvedilol  6.25 mg Oral BID WC   Infusions:  . sodium chloride 125 mL/hr at 07/05/14 2351  . heparin 1,350 Units/hr (07/06/14 0015)    Assessment: 73 yo male with new afib is currently on therapeutic heparin.  Heparin level is 0.64.  Hgb and Plt down a little. Patient may have AAA surgery. Goal of Therapy:  Heparin level 0.3-0.7 units/ml Monitor platelets by anticoagulation protocol: Yes   Plan:  -Continue heparin at 1350 units / hr -Follow up if surgery today -F/u long term anticaog plans Vidal Lampkins, Tsz-Yin 07/06/2014,8:35 AM

## 2014-07-06 NOTE — Progress Notes (Signed)
[facility] Progress Note     Subjective: No flatus or BM yet.  No N/V, NG output 1478mL thick brownish green output.  Up in chair, ambulating OOB, was tachycardic when ambulating.  Cards has cleared him for hernia repair.  Objective: Vital signs in last 24 hours: Temp:  [97.7 F (36.5 C)-98.5 F (36.9 C)] 98.5 F (36.9 C) (11/06 0758) Pulse Rate:  [68-104] 87 (11/06 0800) Resp:  [18-20] 18 (11/06 0758) BP: (90-119)/(67-77) 112/72 mmHg (11/06 0800) SpO2:  [94 %-96 %] 95 % (11/06 0800) Weight:  [201 lb 4.5 oz (91.3 kg)] 201 lb 4.5 oz (91.3 kg) (11/05 2347) Last BM Date: 07/04/14  Intake/Output from previous day: 11/05 0701 - 11/06 0700 In: 4016.8 [I.V.:3116.8; IV Piggyback:900] Out: 1870 [Urine:400; Emesis/NG output:1470] Intake/Output this shift:    PE: Gen:  Alert, NAD, pleasant Abd: Soft, less distended, mild tenderness over hernia, hernia is reducible and soft, +BS, no HSM, NG with brownish green particulate food matter with foul smell, 143mL/24hr.  Lab Results:   Recent Labs  07/05/14 0315 07/06/14 0346  WBC 11.1* 7.6  HGB 15.8 13.7  HCT 44.4 40.3  PLT 186 149*   BMET  Recent Labs  07/05/14 0315 07/06/14 0346  NA 139 145  K 3.7 4.4  CL 97 105  CO2 27 27  GLUCOSE 117* 89  BUN 32* 40*  CREATININE 1.97* 1.49*  CALCIUM 9.4 8.7   PT/INR No results for input(s): LABPROT, INR in the last 72 hours. CMP     Component Value Date/Time   NA 145 07/06/2014 0346   NA 143 07/03/2014 1024   K 4.4 07/06/2014 0346   K 3.9 07/03/2014 1024   CL 105 07/06/2014 0346   CL 109* 08/01/2012 1046   CO2 27 07/06/2014 0346   CO2 25 07/03/2014 1024   GLUCOSE 89 07/06/2014 0346   GLUCOSE 96 07/03/2014 1024   GLUCOSE 93 08/01/2012 1046   BUN 40* 07/06/2014 0346   BUN 19.4 07/03/2014 1024   CREATININE 1.49* 07/06/2014 0346   CREATININE 1.0 07/03/2014 1024   CREATININE 1.00 08/17/2013 1250   CALCIUM 8.7 07/06/2014 0346   CALCIUM 9.3 07/03/2014 1024   PROT 6.2 07/06/2014 0346   PROT 7.2 07/03/2014 1024   ALBUMIN 2.8* 07/06/2014 0346   ALBUMIN 3.4* 07/03/2014 1024   AST 27 07/06/2014 0346   AST 29 07/03/2014 1024   ALT 21 07/06/2014 0346   ALT 25 07/03/2014 1024   ALKPHOS 105 07/06/2014 0346   ALKPHOS 145 07/03/2014 1024   BILITOT 1.3* 07/06/2014 0346   BILITOT 1.33* 07/03/2014 1024   GFRNONAA 45* 07/06/2014 0346   GFRAA 53* 07/06/2014 0346   Lipase     Component Value Date/Time   LIPASE 26 07/04/2014 1858       Studies/Results: No results found.  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 May be able to perform hernia repair this admission if he is cleared by cardiology, but risk are higher with urgent repairs than if done electively. Goal would be to try to get him over this BO conservatively and schedule elective surgery as an OP. Cardiology agrees he is cleared to have operation if it comes to that,  but I feel he is improving with conservative management. 2. Encouraged ambulating OOB and IS 3. SCD's and lovenox 4. Cards and vascular on board 5. Nurses need to be diligent to prevent the NG tube from clogging seeing as its very thick feculent output which easily clogs even his large bore NG tube.  6. Continue NG suction, may be able to do claming trials tomorrow if output is down and having flatus.  Will follow     LOS: 2 days    DORT, Jeanifer Halliday 07/06/2014, 9:04 AM Pager: [phone]

## 2014-07-06 NOTE — Progress Notes (Signed)
Patient ID: DEMITRUS FRANCISCO, male   DOB: June 17, 1941, 73 y.o.   MRN: [id number]   Patient Name: Joseph [name] Date of Encounter: 07/06/2014     Principal Problem:   Umbilical hernia, incarcerated Active Problems:   Hepatic cirrhosis   Atrial fibrillation with rapid ventricular response   Small bowel obstruction   COPD (chronic obstructive pulmonary disease)   Hypothyroidism    SUBJECTIVE  No cardiac complaints  Wants hernia fixed   CURRENT MEDS . antiseptic oral rinse  7 mL Mouth Rinse BID  . carvedilol  6.25 mg Oral BID WC  . levothyroxine  88 mcg Intravenous Daily  . tiotropium  18 mcg Inhalation Daily    OBJECTIVE  Filed Vitals:   07/06/14 0600 07/06/14 0758 07/06/14 0800 07/06/14 0921  BP: 104/68  112/72   Pulse: 81 85 87   Temp:  98.5 F (36.9 C)    TempSrc:  Oral    Resp:  18    Height:      Weight:      SpO2: 96% 94% 95% 95%    Intake/Output Summary (Last 24 hours) at 07/06/14 0941 Last data filed at 07/06/14 0800  Gross per 24 hour  Intake 4016.75 ml  Output   1870 ml  Net 2146.75 ml   Filed Weights   07/04/14 1839 07/05/14 0023 07/05/14 2347  Weight: 90.719 kg (200 lb) 90.2 kg (198 lb 13.7 oz) 91.3 kg (201 lb 4.5 oz)    PHYSICAL EXAM  General: Pleasant, NAD.  NG tube in  Neuro: Alert and oriented X 3. Moves all extremities spontaneously. Psych: Normal affect. HEENT:  Normal  Neck: Supple without bruits or JVD. Lungs:  Resp regular and unlabored, CTA. Heart: tachycardic no s3, s4, or murmurs. Abdomen: Soft, non-tender, non-distended, BS + x 4.  Extremities: No clubbing, cyanosis or edema. DP/PT/Radials 2+ and equal bilaterally.  Accessory Clinical Findings  CBC  Recent Labs  07/03/14 1024  07/04/14 1858 07/05/14 0315 07/06/14 0346  WBC 7.1  < > 12.5* 11.1* 7.6  NEUTROABS 4.6  --  10.3*  --   --   HGB 15.4  < > 16.8 15.8 13.7  HCT 46.4  < > 47.7 44.4 40.3  MCV 98.9*  < > 93.9 96.1 97.6  PLT 209  < > 220 186 149*  < > =  values in this interval not displayed. Basic Metabolic Panel  Recent Labs  07/05/14 0315 07/06/14 0346  NA 139 145  K 3.7 4.4  CL 97 105  CO2 27 27  GLUCOSE 117* 89  BUN 32* 40*  CREATININE 1.97* 1.49*  CALCIUM 9.4 8.7   Liver Function Tests  Recent Labs  07/05/14 0315 07/06/14 0346  AST 31 27  ALT 24 21  ALKPHOS 136* 105  BILITOT 1.9* 1.3*  PROT 7.1 6.2  ALBUMIN 3.2* 2.8*    Recent Labs  07/04/14 1858  LIPASE 26   Cardiac Enzymes  Recent Labs  07/04/14 1858  TROPONINI <0.30    TELE NSR no afib     ECG  NSR with nonspecific ST T wave changes  Echocardiogram  LV EF: 55% -  60%  ------------------------------------------------------------ Indications:   V728.1 Pre-op evaluation. 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 Dyspnea 786.09.  ------------------------------------------------------------ History:  PMH: Emphysema. Acquired from the patient and from the patient's chart. Exertional dyspnea. Known aortic aneurysm. Chronic obstructive pulmonary disease. The patient has a lung malignancy and is status postchemotherapy. Risk factors: Former tobacco use. Dyslipidemia.  ------------------------------------------------------------  Study Conclusions  - Left ventricle: The cavity size was normal. Systolic function was normal. The estimated ejection fraction was in the range of 55% to 60%. Wall motion was normal; there were no regional wall motion abnormalities. There was an increased relative contribution of atrial contraction to ventricular filling. Doppler parameters are consistent with abnormal left ventricular relaxation (grade 1 diastolic dysfunction). - Aortic valve: Moderate diffuse thickening and calcification, consistent with sclerosis. Trivial regurgitation. - Pulmonary arteries: PA peak pressure: 15mm Hg (S). Impressions:  - The right ventricular systolic pressure was increased consistent with mild  pulmonary hypertension.    Radiology/Studies    ASSESSMENT AND PLAN 73 y.o. male w/ PMHx significant for PVD s/p aorta to iliac bypass 10/2013, nonobs CAD by cath remotely, lung CA s/p chemo/rad 2012, HTN, hyperlipidemia and HFpEF present with N/V and found to have small bowel obstruction 2/2 incarceration. He was also noted to have a-fib with RVR  1. A-fib with RVR converted to NSR   - Echo 10/2013 EF 62-26%, grade 1 diastolic dysfunct, moderately calcified aortic valve, PA peak pressure 35 mmHg  - TSH WNL  - CHADS2Vasc score is 3 (age, CHF, PVD) need long term systemic anticoagulation therapy once recover from surgery  - increase coreg   - start iv amiodarone 24-48 hrs prior to surgery if inpatient   2. Ventral hernia with small bowel incarceration  ? Surgery Monday or Tuesday after bowel rest with NG tube  Clamp intermittently per surgery   3. Small bowel obstruction 2/2 #2  4. H/o Lung CA: no recurrence noted on CT  5. Dehydration  - h/o HFpEF, at risk for fluid overload. Monitor fluid status   6. AKI: improved 1.49 today   Signed, Jenkins Rouge PA-C Pager: [phone]

## 2014-07-06 NOTE — Progress Notes (Addendum)
Patient continues to have NG tube with RN's being diligent to prevent the NG tube from clogging by flushing with air per orders q 6 hours and PRN. Patient was OOB to chair and ambulated hall and tolerated only slight complication O2 sats decrease to 85% on RA but asymptomatic. Heart rate stable 85-90 NSR and BP 116/80. Stand by assist only needed steady gait. Will continue to plan of care per orders.

## 2014-07-06 NOTE — Progress Notes (Signed)
[facility]  Subjective  - a little better passing some flatus   Objective 107/71 84 98 F (36.7 C) (Oral) 20 95%  Intake/Output Summary (Last 24 hours) at 07/06/14 0723 Last data filed at 07/06/14 0500  Gross per 24 hour  Intake 3739.75 ml  Output   1650 ml  Net 2089.75 ml   Abdomen soft minimal tenderness   Assessment/Planning: Renal function improving Passing some flatus Leukocytosis resolved Continue to follow  Americus Perkey E 07/06/2014 7:23 AM --  Laboratory Lab Results:  Recent Labs  07/05/14 0315 07/06/14 0346  WBC 11.1* 7.6  HGB 15.8 13.7  HCT 44.4 40.3  PLT 186 149*   BMET  Recent Labs  07/05/14 0315 07/06/14 0346  NA 139 145  K 3.7 4.4  CL 97 105  CO2 27 27  GLUCOSE 117* 89  BUN 32* 40*  CREATININE 1.97* 1.49*  CALCIUM 9.4 8.7    COAG Lab Results  Component Value Date   INR 1.29 11/01/2013   INR 1.37 11/01/2013   INR 1.32 10/31/2013   No results found for: PTT

## 2014-07-07 ENCOUNTER — Inpatient Hospital Stay (HOSPITAL_COMMUNITY): Payer: Medicare Other

## 2014-07-07 LAB — CBC
HEMATOCRIT: 39.6 % (ref 39.0–52.0)
HEMOGLOBIN: 13.6 g/dL (ref 13.0–17.0)
MCH: 33.6 pg (ref 26.0–34.0)
MCHC: 34.3 g/dL (ref 30.0–36.0)
MCV: 97.8 fL (ref 78.0–100.0)
Platelets: 152 10*3/uL (ref 150–400)
RBC: 4.05 MIL/uL — ABNORMAL LOW (ref 4.22–5.81)
RDW: 13.9 % (ref 11.5–15.5)
WBC: 6.6 10*3/uL (ref 4.0–10.5)

## 2014-07-07 LAB — COMPREHENSIVE METABOLIC PANEL
ALBUMIN: 2.9 g/dL — AB (ref 3.5–5.2)
ALT: 22 U/L (ref 0–53)
AST: 30 U/L (ref 0–37)
Alkaline Phosphatase: 104 U/L (ref 39–117)
Anion gap: 13 (ref 5–15)
BUN: 35 mg/dL — AB (ref 6–23)
CO2: 27 mEq/L (ref 19–32)
Calcium: 8.7 mg/dL (ref 8.4–10.5)
Chloride: 104 mEq/L (ref 96–112)
Creatinine, Ser: 0.91 mg/dL (ref 0.50–1.35)
GFR calc non Af Amer: 83 mL/min — ABNORMAL LOW (ref >90)
Glucose, Bld: 84 mg/dL (ref 70–99)
POTASSIUM: 3.6 meq/L — AB (ref 3.7–5.3)
Sodium: 144 mEq/L (ref 137–147)
TOTAL PROTEIN: 6.2 g/dL (ref 6.0–8.3)
Total Bilirubin: 1.3 mg/dL — ABNORMAL HIGH (ref 0.3–1.2)

## 2014-07-07 LAB — GLUCOSE, CAPILLARY
GLUCOSE-CAPILLARY: 76 mg/dL (ref 70–99)
Glucose-Capillary: 82 mg/dL (ref 70–99)

## 2014-07-07 LAB — HEPARIN LEVEL (UNFRACTIONATED): HEPARIN UNFRACTIONATED: 0.65 [IU]/mL (ref 0.30–0.70)

## 2014-07-07 MED ORDER — BISACODYL 10 MG RE SUPP
10.0000 mg | Freq: Every day | RECTAL | Status: DC
  Administered 2014-07-07 – 2014-07-08 (×2): 10 mg via RECTAL
  Filled 2014-07-07 (×3): qty 1

## 2014-07-07 MED ORDER — POTASSIUM CHLORIDE 10 MEQ/100ML IV SOLN
10.0000 meq | INTRAVENOUS | Status: AC
  Administered 2014-07-07 (×3): 10 meq via INTRAVENOUS
  Filled 2014-07-07 (×2): qty 100

## 2014-07-07 MED ORDER — POTASSIUM CHLORIDE IN NACL 20-0.9 MEQ/L-% IV SOLN
INTRAVENOUS | Status: DC
  Administered 2014-07-07 – 2014-07-11 (×5): via INTRAVENOUS
  Filled 2014-07-07 (×13): qty 1000

## 2014-07-07 NOTE — Progress Notes (Signed)
Patient ID: Joseph [name], male   DOB: 1941-02-09, 73 y.o.   MRN: [id number]   Patient Name: Joseph [name] Date of Encounter: 07/07/2014     Principal Problem:   Umbilical hernia, incarcerated Active Problems:   Hepatic cirrhosis   Atrial fibrillation with rapid ventricular response   Small bowel obstruction   COPD (chronic obstructive pulmonary disease)   Hypothyroidism    SUBJECTIVE  No cardiac complaints  Worse abdominal pain and distension   CURRENT MEDS . antiseptic oral rinse  7 mL Mouth Rinse BID  . bisacodyl  10 mg Rectal Daily  . carvedilol  6.25 mg Oral BID WC  . levothyroxine  88 mcg Intravenous Daily  . potassium chloride  10 mEq Intravenous Q1 Hr x 3  . tiotropium  18 mcg Inhalation Daily    OBJECTIVE  Filed Vitals:   07/06/14 2212 07/07/14 0000 07/07/14 0400 07/07/14 0800  BP: 108/67 102/68 106/75 111/83  Pulse: 94 80 86 80  Temp: 99.5 F (37.5 C) 98.6 F (37 C) 97.9 F (36.6 C) 98.2 F (36.8 C)  TempSrc: Oral Oral Oral Oral  Resp: 18 18 18 16   Height:      Weight:   93.441 kg (206 lb)   SpO2: 96% 94% 95% 94%    Intake/Output Summary (Last 24 hours) at 07/07/14 0913 Last data filed at 07/07/14 0745  Gross per 24 hour  Intake 1395.13 ml  Output   2700 ml  Net -1304.87 ml   Filed Weights   07/05/14 0023 07/05/14 2347 07/07/14 0400  Weight: 90.2 kg (198 lb 13.7 oz) 91.3 kg (201 lb 4.5 oz) 93.441 kg (206 lb)    PHYSICAL EXAM  General: Pleasant, NAD.  NG tube in  Neuro: Alert and oriented X 3. Moves all extremities spontaneously. Psych: Normal affect. HEENT:  Normal  Neck: Supple without bruits or JVD. Lungs:  Resp regular and unlabored, CTA. Heart: tachycardic no s3, s4, or murmurs. Abdomen: Soft, non-tender, non-distended, BS + x 4.  Extremities: No clubbing, cyanosis or edema. DP/PT/Radials 2+ and equal bilaterally.  Accessory Clinical Findings  CBC  Recent Labs  07/04/14 1858  07/06/14 0346 07/07/14 0325  WBC  12.5*  < > 7.6 6.6  NEUTROABS 10.3*  --   --   --   HGB 16.8  < > 13.7 13.6  HCT 47.7  < > 40.3 39.6  MCV 93.9  < > 97.6 97.8  PLT 220  < > 149* 152  < > = values in this interval not displayed. Basic Metabolic Panel  Recent Labs  07/06/14 0346 07/07/14 0325  NA 145 144  K 4.4 3.6*  CL 105 104  CO2 27 27  GLUCOSE 89 84  BUN 40* 35*  CREATININE 1.49* 0.91  CALCIUM 8.7 8.7   Liver Function Tests  Recent Labs  07/06/14 0346 07/07/14 0325  AST 27 30  ALT 21 22  ALKPHOS 105 104  BILITOT 1.3* 1.3*  PROT 6.2 6.2  ALBUMIN 2.8* 2.9*    Recent Labs  07/04/14 1858  LIPASE 26   Cardiac Enzymes  Recent Labs  07/04/14 1858  TROPONINI <0.30    TELE NSR no afib     ECG  NSR with nonspecific ST T wave changes  Echocardiogram  LV EF: 55% -  60%  ------------------------------------------------------------ Indications:   V728.1 Pre-op evaluation. 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 Dyspnea 786.09.  ------------------------------------------------------------ History:  PMH: Emphysema. Acquired from the patient and from the patient's chart.  Exertional dyspnea. Known aortic aneurysm. Chronic obstructive pulmonary disease. The patient has a lung malignancy and is status postchemotherapy. Risk factors: Former tobacco use. Dyslipidemia.  ------------------------------------------------------------ Study Conclusions  - Left ventricle: The cavity size was normal. Systolic function was normal. The estimated ejection fraction was in the range of 55% to 60%. Wall motion was normal; there were no regional wall motion abnormalities. There was an increased relative contribution of atrial contraction to ventricular filling. Doppler parameters are consistent with abnormal left ventricular relaxation (grade 1 diastolic dysfunction). - Aortic valve: Moderate diffuse thickening and calcification, consistent with sclerosis.  Trivial regurgitation. - Pulmonary arteries: PA peak pressure: 90mm Hg (S). Impressions:  - The right ventricular systolic pressure was increased consistent with mild pulmonary hypertension.    Radiology/Studies    ASSESSMENT AND PLAN 73 y.o. male w/ PMHx significant for PVD s/p aorta to iliac bypass 10/2013, nonobs CAD by cath remotely, lung CA s/p chemo/rad 2012, HTN, hyperlipidemia and HFpEF present with N/V and found to have small bowel obstruction 2/2 incarceration. He was also noted to have a-fib with RVR  1. A-fib with RVR converted to NSR   - Echo 10/2013 EF 89-16%, grade 1 diastolic dysfunct, moderately calcified aortic valve, PA peak pressure 35 mmHg  - TSH WNL  - CHADS2Vasc score is 3 (age, CHF, PVD) need long term systemic anticoagulation therapy once recover from surgery  - increase coreg   - start iv amiodarone 24-48 hrs prior to surgery if inpatient   2. Ventral hernia with small bowel incarceration  ? Surgery Monday or Tuesday after bowel rest with NG tube  Clamp intermittently per surgery   3. Small bowel obstruction 2/2 #2  4. H/o Lung CA: no recurrence noted on CT  5. Dehydration  - h/o HFpEF, at risk for fluid overload. Monitor fluid status   6. AKI: improved .9  today    Will start amiodarone prior to surgery     Would appear that this patient should have surgery this admission  Signed, Jenkins Rouge PA-C Pager: [phone]

## 2014-07-07 NOTE — Progress Notes (Signed)
Patient ID: Joseph [name], male   DOB: 1941-06-04, 72 y.o.   MRN: [id number] Following a long.  Still with a great deal of dark brown MG out put. Abdomen nontender this morning. Still distended. Slight amount of flatus and small bowel movement yesterday. Plan per medicine, cardiology and Gen. Surgery.

## 2014-07-07 NOTE — Progress Notes (Signed)
ANTICOAGULATION CONSULT NOTE - Follow Up Consult  Pharmacy Consult for heparin Indication: atrial fibrillation  No Known Allergies  Patient Measurements: Height: 6' (182.9 cm) Weight: 206 lb (93.441 kg) IBW/kg (Calculated) : 77.6 Heparin Dosing Weight:   Vital Signs: Temp: 98.2 F (36.8 C) (11/07 0800) Temp Source: Oral (11/07 0800) BP: 111/83 mmHg (11/07 0800) Pulse Rate: 80 (11/07 0800)  Labs:  Recent Labs  07/04/14 1858 07/05/14 0315 07/05/14 1930 07/06/14 0346 07/07/14 0325 07/07/14 0355  HGB 16.8 15.8  --  13.7 13.6  --   HCT 47.7 44.4  --  40.3 39.6  --   PLT 220 186  --  149* 152  --   HEPARINUNFRC  --   --  0.70 0.64  --  0.65  CREATININE 1.37* 1.97*  --  1.49* 0.91  --   TROPONINI <0.30  --   --   --   --   --     Estimated Creatinine Clearance: 88.4 mL/min (by C-G formula based on Cr of 0.91).   Medications:  Scheduled:  . antiseptic oral rinse  7 mL Mouth Rinse BID  . bisacodyl  10 mg Rectal Daily  . carvedilol  6.25 mg Oral BID WC  . levothyroxine  88 mcg Intravenous Daily  . potassium chloride  10 mEq Intravenous Q1 Hr x 3  . tiotropium  18 mcg Inhalation Daily   Infusions:  . 0.9 % NaCl with KCl 20 mEq / L    . heparin 1,350 Units/hr (07/06/14 1917)    Assessment: 73 yo male with new afib is currently on therapeutic heparin.  Heparin level is therapeutic this AM 0.65.  Hg wnl, plt increased to wnl. Patient may have AAA surgery Mon or Tues but surgery prefers conservative management and outpt surgery. No bleeding documented.  Goal of Therapy:  Heparin level 0.3-0.7 units/ml Monitor platelets by anticoagulation protocol: Yes   Plan:  -Continue heparin at 1350 units / hr -Daily HL/CBC -Monitor s/sx bleed -F/u potential surgery next week -F/u long term anticaog plans if no surgery is decided  Elicia Lamp, PharmD Clinical Pharmacist - Resident Pager [phone] 07/07/2014 9:10 AM

## 2014-07-07 NOTE — Progress Notes (Addendum)
Subjective: NGT in place and to suction this AM  Had a good day yesterday but today having more distension  Minimal flatus  Having some nausea   Objective: Vital signs in last 24 hours: Temp:  [97.9 F (36.6 C)-99.5 F (37.5 C)] 98.2 F (36.8 C) 2023-07-24 0800) Pulse Rate:  [80-100] 80 24-Jul-2023 0800) Resp:  [16-18] 16 07-24-2023 0800) BP: (102-116)/(67-83) 111/83 mmHg 24-Jul-2023 0800) SpO2:  [94 %-96 %] 94 % July 24, 2023 0800) Weight:  [206 lb (93.441 kg)] 206 lb (93.441 kg) Jul 24, 2023 0400) Weight change: 4 lb 11.5 oz (2.141 kg) Last BM Date: 07/06/14  CBG (last 3)  No results for input(s): GLUCAP in the last 72 hours.  Intake/Output from previous day: 11/06 0701 - 24-Jul-2023 0700 In: 1395.1 [I.V.:1395.1] Out: 1900 [Urine:625; Emesis/NG output:1275] Intake/Output this shift: Total I/O In: -  Out: 800 [Emesis/NG output:800]  General appearance: alert and no distress Eyes: no scleral icterus Throat: oropharynx moist without erythema Resp: clear to auscultation bilaterally Cardio: regular rate and rhythm GI: slightly distended with proximal umbilical hernia which is protruding; mild tenderness; minimal bowel sounds. Extremities: no clubbing, cyanosis or edema   Lab Results:  Recent Labs  07/06/14 0346 2014-07-23 0325  NA 145 144  K 4.4 3.6*  CL 105 104  CO2 27 27  GLUCOSE 89 84  BUN 40* 35*  CREATININE 1.49* 0.91  CALCIUM 8.7 8.7    Recent Labs  07/06/14 0346 2014/07/23 0325  AST 27 30  ALT 21 22  ALKPHOS 105 104  BILITOT 1.3* 1.3*  PROT 6.2 6.2  ALBUMIN 2.8* 2.9*    Recent Labs  07/04/14 1858  07/06/14 0346 2014/07/23 0325  WBC 12.5*  < > 7.6 6.6  NEUTROABS 10.3*  --   --   --   HGB 16.8  < > 13.7 13.6  HCT 47.7  < > 40.3 39.6  MCV 93.9  < > 97.6 97.8  PLT 220  < > 149* 152  < > = values in this interval not displayed. Lab Results  Component Value Date   INR 1.29 11/01/2013   INR 1.37 11/01/2013   INR 1.32 10/31/2013    Recent Labs  07/04/14 1858  TROPONINI  <0.30   No results for input(s): TSH, T4TOTAL, T3FREE, THYROIDAB in the last 72 hours.  Invalid input(s): FREET3 No results for input(s): VITAMINB12, FOLATE, FERRITIN, TIBC, IRON, RETICCTPCT in the last 72 hours.  Studies/Results: Dg Abd 2 Views  07-23-14   CLINICAL DATA:  Small bowel obstruction.  EXAM: ABDOMEN - 2 VIEW  COMPARISON:  Abdominal CT from 4 days prior  FINDINGS: Dilated bowel in the central abdomen which has progressed in diameter since 07/03/2014. No definite dilatation of colon. Gastric suction tube terminates in the proximal stomach. No evidence for perforation. No new concerning mass effect.  IMPRESSION: 1. Small bowel obstruction which has progressed since comparison 07/03/2014. 2. Orogastric tube in good position   Electronically Signed   By: Jorje Guild M.D.   On: 23-Jul-2014 09:24     Medications: Scheduled: . antiseptic oral rinse  7 mL Mouth Rinse BID  . bisacodyl  10 mg Rectal Daily  . carvedilol  6.25 mg Oral BID WC  . levothyroxine  88 mcg Intravenous Daily  . potassium chloride  10 mEq Intravenous Q1 Hr x 3  . tiotropium  18 mcg Inhalation Daily   Continuous: . 0.9 % NaCl with KCl 20 mEq / L 125 mL/hr at 07/23/2014 0927  . heparin 1,350  Units/hr (07/06/14 1917)    Assessment/Plan: Principal Problem: 1. Small bowel obstruction secondary to Umbilical hernia, incarcerated- continue NG suction, NPO status- Patient is stable for surgery per Cardiology with no need for additional preoperative testing.  Pulmonary status stable.  OK to proceed with surgery when recommended by [facility].  I am concerned he will have recurrent issues if surgery is delayed as he is describing progressive symptoms over the past 4-6 weeks culminating in SBO.  Postoperative primary management per General Surgery.  Active Problems: # hypokalemia - adding KCl to IVF and 85mEq of push runs today  2. Atrial fibrillation with rapid ventricular response- secondary to acute stress related to #1  and volume depletion- remains in Sinus Rhythm with rate improved with hydration and Coreg. Long term anticoagulation per Cardiology as this may be an isolated event. Keep K ~4.0. 3. Acute Renal Failure- continued improvement with hydration.  4. Hepatic cirrhosis- LFTs improved. No evidence of ascites on CT. 5. Lung Cancer- no recurrence noted on CT 6. Hypothyroidism- Synthroid restarted at 1/2 dose IV- resume 151mcg po when tolerating pos. 7. COPD- stable.  Restarted Spiriva and use Xopenex as needed (avoid albuterol due to AFib). 8. Disposition- per general surgery.    LOS: 3 days   Areen Trautner 07/07/2014, 9:43 AM

## 2014-07-07 NOTE — Plan of Care (Signed)
Problem: Phase I Progression Outcomes Goal: OOB as tolerated unless otherwise ordered Outcome: Progressing Goal: Voiding-avoid urinary catheter unless indicated Outcome: Completed/Met Date Met:  07/07/14 Goal: Hemodynamically stable Outcome: Completed/Met Date Met:  07/07/14

## 2014-07-07 NOTE — Progress Notes (Signed)
[facility] Progress Note     Subjective: Pt had a rough night.  Yesterday was great, but he developed worsening abdominal distension, pain.  No N/V, NG output still high.  Continues to ambulate well.  Some flatus yesterday, and a small BM.    Objective: Vital signs in last 24 hours: Temp:  [97.9 F (36.6 C)-99.5 F (37.5 C)] 97.9 F (36.6 C) (11/07 0400) Pulse Rate:  [80-100] 86 (11/07 0400) Resp:  [18] 18 (11/07 0400) BP: (102-116)/(67-80) 106/75 mmHg (11/07 0400) SpO2:  [94 %-96 %] 95 % (11/07 0400) Weight:  [206 lb (93.441 kg)] 206 lb (93.441 kg) (11/07 0400) Last BM Date: 07/06/14  Intake/Output from previous day: 11/06 0701 - 11/07 0700 In: 1395.1 [I.V.:1395.1] Out: 1900 [Urine:625; Emesis/NG output:1275] Intake/Output this shift:    PE: Gen:  Alert, NAD, pleasant Abd: Soft, more distended than yesterday, tenderness over hernia, hernia is reducible and soft, +BS, but some high pitched tinkling sounds, no HSM, NG with green bilious output, 1258mL/24hr.  Lab Results:   Recent Labs  07/06/14 0346 07/07/14 0325  WBC 7.6 6.6  HGB 13.7 13.6  HCT 40.3 39.6  PLT 149* 152   BMET  Recent Labs  07/06/14 0346 07/07/14 0325  NA 145 144  K 4.4 3.6*  CL 105 104  CO2 27 27  GLUCOSE 89 84  BUN 40* 35*  CREATININE 1.49* 0.91  CALCIUM 8.7 8.7   PT/INR No results for input(s): LABPROT, INR in the last 72 hours. CMP     Component Value Date/Time   NA 144 07/07/2014 0325   NA 143 07/03/2014 1024   K 3.6* 07/07/2014 0325   K 3.9 07/03/2014 1024   CL 104 07/07/2014 0325   CL 109* 08/01/2012 1046   CO2 27 07/07/2014 0325   CO2 25 07/03/2014 1024   GLUCOSE 84 07/07/2014 0325   GLUCOSE 96 07/03/2014 1024   GLUCOSE 93 08/01/2012 1046   BUN 35* 07/07/2014 0325   BUN 19.4 07/03/2014 1024   CREATININE 0.91 07/07/2014 0325   CREATININE 1.0 07/03/2014 1024   CREATININE 1.00 08/17/2013 1250   CALCIUM 8.7 07/07/2014 0325   CALCIUM 9.3 07/03/2014 1024   PROT 6.2 07/07/2014 0325   PROT 7.2 07/03/2014 1024   ALBUMIN 2.9* 07/07/2014 0325   ALBUMIN 3.4* 07/03/2014 1024   AST 30 07/07/2014 0325   AST 29 07/03/2014 1024   ALT 22 07/07/2014 0325   ALT 25 07/03/2014 1024   ALKPHOS 104 07/07/2014 0325   ALKPHOS 145 07/03/2014 1024   BILITOT 1.3* 07/07/2014 0325   BILITOT 1.33* 07/03/2014 1024   GFRNONAA 83* 07/07/2014 0325   GFRAA >90 07/07/2014 0325   Lipase     Component Value Date/Time   LIPASE 26 07/04/2014 1858       Studies/Results: No results found.  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 May be able to perform hernia repair this admission if needed since cleared by cardiology, but risk are higher with urgent repairs than if done electively. Was improving nicely yesterday, but today is more distended.  If no improvement will need ex lap this admission.  Goal would be to try to get him over this BO  conservatively and schedule elective surgery as an OP.  2. Encouraged ambulating OOB and IS 3. SCD's and lovenox 4. Cards and vascular on board 5. Nurses need to be diligent to prevent the NG tube from clogging seeing as its very thick feculent output which easily clogs even his large bore NG tube.  6. Continue NG suction, output is still 1271mL/24 hour, now bilious and not feculent. 7.  Check KUB, labs okay except mild hypokalemia, supplementation per primary service.    Will follow.    LOS: 3 days    [name] 07/07/2014, 7:34 AM Pager: [phone]

## 2014-07-07 NOTE — Plan of Care (Signed)
Problem: Phase II Progression Outcomes Goal: Ventricular heart rate < 100/min Outcome: Completed/Met Date Met:  07/07/14 Goal: Anticoagulation Therapy per MD order Outcome: Completed/Met Date Met:  07/07/14 Goal: CV Risk Factors identified Outcome: Completed/Met Date Met:  07/07/14 Goal: Discharge plan established Outcome: Progressing

## 2014-07-08 LAB — COMPREHENSIVE METABOLIC PANEL
ALK PHOS: 96 U/L (ref 39–117)
ALT: 23 U/L (ref 0–53)
AST: 30 U/L (ref 0–37)
Albumin: 2.8 g/dL — ABNORMAL LOW (ref 3.5–5.2)
Anion gap: 12 (ref 5–15)
BILIRUBIN TOTAL: 1.1 mg/dL (ref 0.3–1.2)
BUN: 31 mg/dL — ABNORMAL HIGH (ref 6–23)
CALCIUM: 8.7 mg/dL (ref 8.4–10.5)
CHLORIDE: 108 meq/L (ref 96–112)
CO2: 27 meq/L (ref 19–32)
Creatinine, Ser: 0.94 mg/dL (ref 0.50–1.35)
GFR calc Af Amer: >90 mL/min (ref >90)
GFR, EST NON AFRICAN AMERICAN: 82 mL/min — AB (ref >90)
GLUCOSE: 81 mg/dL (ref 70–99)
Potassium: 3.9 mEq/L (ref 3.7–5.3)
SODIUM: 147 meq/L (ref 137–147)
Total Protein: 6.1 g/dL (ref 6.0–8.3)

## 2014-07-08 LAB — GLUCOSE, CAPILLARY
GLUCOSE-CAPILLARY: 82 mg/dL (ref 70–99)
Glucose-Capillary: 76 mg/dL (ref 70–99)
Glucose-Capillary: 85 mg/dL (ref 70–99)
Glucose-Capillary: 78 mg/dL (ref 70–99)

## 2014-07-08 LAB — CBC
HCT: 38.4 % — ABNORMAL LOW (ref 39.0–52.0)
HEMOGLOBIN: 12.9 g/dL — AB (ref 13.0–17.0)
MCH: 33.2 pg (ref 26.0–34.0)
MCHC: 33.6 g/dL (ref 30.0–36.0)
MCV: 98.7 fL (ref 78.0–100.0)
Platelets: 144 10*3/uL — ABNORMAL LOW (ref 150–400)
RBC: 3.89 MIL/uL — AB (ref 4.22–5.81)
RDW: 14 % (ref 11.5–15.5)
WBC: 5 10*3/uL (ref 4.0–10.5)

## 2014-07-08 LAB — HEPARIN LEVEL (UNFRACTIONATED)
HEPARIN UNFRACTIONATED: 0.71 [IU]/mL — AB (ref 0.30–0.70)
Heparin Unfractionated: 0.75 IU/mL — ABNORMAL HIGH (ref 0.30–0.70)
Heparin Unfractionated: 0.7 IU/mL (ref 0.30–0.70)

## 2014-07-08 MED ORDER — ZOLPIDEM TARTRATE 5 MG PO TABS
5.0000 mg | ORAL_TABLET | Freq: Every evening | ORAL | Status: DC | PRN
  Administered 2014-07-08 – 2014-07-18 (×2): 5 mg via ORAL
  Filled 2014-07-08 (×2): qty 1

## 2014-07-08 NOTE — Progress Notes (Signed)
   [facility]  Subjective  -" I have passed some amounts of flatus.  My stomach is tighter in some places than others."   Objective 114/71 74 97.7 F (36.5 C) (Oral) 20 96%  Intake/Output Summary (Last 24 hours) at 07/08/14 0840 Last data filed at 07/08/14 1219  Gross per 24 hour  Intake  796.5 ml  Output   3952 ml  Net -3155.5 ml  Abdomin distended, non tender to palpation Distal DP palpable pulses    Assessment/Planning: SBO from chronically incarcerated ventral hernia S/p AAA repair complicated by abdominal wound dehiscence 10/31/13/ stable repair. NG tube in place patient states possible surgery with General.    Laurence Slate [facility] 07/08/2014 8:40 AM --  Laboratory Lab Results:  Recent Labs  07/07/14 0325 07/08/14 0345  WBC 6.6 5.0  HGB 13.6 12.9*  HCT 39.6 38.4*  PLT 152 144*   BMET  Recent Labs  07/07/14 0325 07/08/14 0345  NA 144 147  K 3.6* 3.9  CL 104 108  CO2 27 27  GLUCOSE 84 81  BUN 35* 31*  CREATININE 0.91 0.94  CALCIUM 8.7 8.7    COAG Lab Results  Component Value Date   INR 1.29 11/01/2013   INR 1.37 11/01/2013   INR 1.32 10/31/2013   No results found for: PTT

## 2014-07-08 NOTE — Progress Notes (Signed)
ANTICOAGULATION CONSULT NOTE - Follow Up Consult  Pharmacy Consult for heparin Indication: atrial fibrillation  No Known Allergies  Patient Measurements: Height: 6' (182.9 cm) Weight: 205 lb 3.2 oz (93.078 kg) IBW/kg (Calculated) : 77.6 Heparin Dosing Weight: 93 kg  Vital Signs: Temp: 98.5 F (36.9 C) (11/08 1600) Temp Source: Oral (11/08 1600) BP: 103/65 mmHg (11/08 1600) Pulse Rate: 96 (11/08 1600)  Labs:  Recent Labs  07/06/14 0346 07/07/14 0325  07/08/14 0345 07/08/14 1100 07/08/14 1819  HGB 13.7 13.6  --  12.9*  --   --   HCT 40.3 39.6  --  38.4*  --   --   PLT 149* 152  --  144*  --   --   HEPARINUNFRC 0.64  --   < > 0.75* 0.70 0.71*  CREATININE 1.49* 0.91  --  0.94  --   --   < > = values in this interval not displayed.  Estimated Creatinine Clearance: 79.1 mL/min (by C-G formula based on Cr of 0.94).   Medications:  Scheduled:  . antiseptic oral rinse  7 mL Mouth Rinse BID  . bisacodyl  10 mg Rectal Daily  . carvedilol  6.25 mg Oral BID WC  . levothyroxine  88 mcg Intravenous Daily  . tiotropium  18 mcg Inhalation Daily   Infusions:  . 0.9 % NaCl with KCl 20 mEq / L 75 mL/hr at 07/08/14 0513  . heparin 1,150 Units/hr (07/08/14 1221)    Assessment: 73 yo male with SBO due to incarcerated umbilical hernia and new onset atrial fibrillation.  He is on anticoagulation with Heparin and his heparin level remains slightly supratherapeutic.  Note he may have surgery for his hernia on Monday or Tuesday.  Goal of Therapy:  Heparin level 0.3-0.7 units/ml Monitor platelets by anticoagulation protocol: Yes   Plan:  Decrease Heparin to 1000 units/hr Daily Heparin level and CBC Follow for surgical plans Anticipate long-term anticoagulation for atrial fibrillation once his acute surgical needs are resolved.  Legrand Como, Pharm.D., BCPS, AAHIVP Clinical Pharmacist Phone: [phone] or [phone] 07/08/2014, 7:32 PM

## 2014-07-08 NOTE — Progress Notes (Signed)
Nurse indicated that patient, who is [place], would like a priest or a Theme park manager to offer prayer.  Patient is awaiting surgery for a bowel obstruction.  Patient stated that he was doing well, and that he wanted general prayer.  It did not need to be from a priest.  Patient's wife entered the room shortly after the visit began.  The couple is from [place] and attend a [employer] there.  The wife had just come from attending mass at [place], a local church near the hospital.  She stated that patient had the same thing happen in March and it all seems like deja vous.  Chaplain offered emotional support and prayer, including the Lord's Prayer in unison.  Wife became teary.  She commented that it had been a rough two years for the patient and said they welcome prayers from anyone.  Chaplain left a card with couple and offered ongoing support. I will make a referral for a visit by the floor chaplain.  Please call for support as needed.  Dorathy Daft Hooker, [place]

## 2014-07-08 NOTE — Progress Notes (Signed)
Subjective: NGT placed to suction this AM  Continue to have minimal flatus ,no BM KUB yesterday showed SBO progression  Minimal abd tenderness Hungry and thirsty as expected   Objective: Vital signs in last 24 hours: Temp:  [97.4 F (36.3 C)-98.8 F (37.1 C)] 97.7 F (36.5 C) (11/08 0400) Pulse Rate:  [71-98] 74 (11/08 0800) Resp:  [13-22] 20 (11/08 0800) BP: (103-114)/(60-79) 114/71 mmHg (11/08 0800) SpO2:  [94 %-96 %] 96 % (11/08 0800) Weight:  [205 lb 3.2 oz (93.078 kg)] 205 lb 3.2 oz (93.078 kg) (11/08 0400) Weight change: -12.8 oz (-0.363 kg) Last BM Date: 07/06/14  CBG (last 3)   Recent Labs  23-Jul-2014 2027 2014/07/23 2345 07/08/14 0730  GLUCAP 76 82 82    Intake/Output from previous day: 07-24-2023 0701 - 11/08 0700 In: 796.5 [I.V.:796.5] Out: 4552 [Urine:102; Emesis/NG output:4450] Intake/Output this shift: Total I/O In: -  Out: 200 [Urine:200]  General appearance: alert and no distress Eyes: no scleral icterus Throat: oropharynx moist without erythema Resp: clear to auscultation bilaterally Cardio: regular rate and rhythm GI: slightly distended with proximal umbilical hernia which is protruding; mild tenderness; minimal bowel sounds. Extremities: no clubbing, cyanosis or edema   Lab Results:  Recent Labs  Jul 23, 2014 0325 07/08/14 0345  NA 144 147  K 3.6* 3.9  CL 104 108  CO2 27 27  GLUCOSE 84 81  BUN 35* 31*  CREATININE 0.91 0.94  CALCIUM 8.7 8.7    Recent Labs  07/23/2014 0325 07/08/14 0345  AST 30 30  ALT 22 23  ALKPHOS 104 96  BILITOT 1.3* 1.1  PROT 6.2 6.1  ALBUMIN 2.9* 2.8*    Recent Labs  23-Jul-2014 0325 07/08/14 0345  WBC 6.6 5.0  HGB 13.6 12.9*  HCT 39.6 38.4*  MCV 97.8 98.7  PLT 152 144*   Lab Results  Component Value Date   INR 1.29 11/01/2013   INR 1.37 11/01/2013   INR 1.32 10/31/2013   No results for input(s): CKTOTAL, CKMB, CKMBINDEX, TROPONINI in the last 72 hours. No results for input(s): TSH, T4TOTAL, T3FREE,  THYROIDAB in the last 72 hours.  Invalid input(s): FREET3 No results for input(s): VITAMINB12, FOLATE, FERRITIN, TIBC, IRON, RETICCTPCT in the last 72 hours.  Studies/Results: Dg Abd 2 Views  Jul 23, 2014   CLINICAL DATA:  Small bowel obstruction.  EXAM: ABDOMEN - 2 VIEW  COMPARISON:  Abdominal CT from 4 days prior  FINDINGS: Dilated bowel in the central abdomen which has progressed in diameter since 07/03/2014. No definite dilatation of colon. Gastric suction tube terminates in the proximal stomach. No evidence for perforation. No new concerning mass effect.  IMPRESSION: 1. Small bowel obstruction which has progressed since comparison 07/03/2014. 2. Orogastric tube in good position   Electronically Signed   By: Jorje Guild M.D.   On: Jul 23, 2014 09:24     Medications: Scheduled: . antiseptic oral rinse  7 mL Mouth Rinse BID  . bisacodyl  10 mg Rectal Daily  . carvedilol  6.25 mg Oral BID WC  . levothyroxine  88 mcg Intravenous Daily  . tiotropium  18 mcg Inhalation Daily   Continuous: . 0.9 % NaCl with KCl 20 mEq / L 75 mL/hr at 07/08/14 0513  . heparin 1,250 Units/hr (07/08/14 2202)    Assessment/Plan: Principal Problem: 1. Small bowel obstruction secondary to Umbilical hernia, incarcerated- continue NG suction, NPO status- Patient is stable for surgery per Cardiology with no need for additional preoperative testing.  Pulmonary status stable.  OK  to proceed with surgery when recommended by [facility].  I am concerned he will have recurrent issues if surgery is delayed as he is describing progressive symptoms over the past 4-6 weeks culminating in SBO.  Postoperative primary management per General Surgery.  Active Problems: # hypokalemia - currently controlled. On NS + KCl  2. Atrial fibrillation with rapid ventricular response- secondary to acute stress related to #1 and volume depletion- remains in Sinus Rhythm with rate improved with hydration and Coreg. Long term anticoagulation per  Cardiology as this may be an isolated event. Keep K ~4.0. 3. Acute Renal Failure- continued improvement with hydration.  4. Hepatic cirrhosis- LFTs improved. No evidence of ascites on CT. 5. Lung Cancer- no recurrence noted on CT 6. Hypothyroidism- Synthroid restarted at 1/2 dose IV- resume 185mcg po when tolerating pos. 7. COPD- stable.  Restarted Spiriva and use Xopenex as needed (avoid albuterol due to AFib). 8. Disposition- per general surgery.    LOS: 4 days   Joseph [name] 07/08/2014, 8:48 AM

## 2014-07-08 NOTE — Progress Notes (Signed)
Patient ID: OMARII SCALZO, male   DOB: Jun 29, 1941, 73 y.o.   MRN: [id number]   Patient Name: Joseph [name] Date of Encounter: 07/08/2014     Principal Problem:   Umbilical hernia, incarcerated Active Problems:   Hepatic cirrhosis   Atrial fibrillation with rapid ventricular response   Small bowel obstruction   COPD (chronic obstructive pulmonary disease)   Hypothyroidism    SUBJECTIVE  No cardiac complaints  NG tube clamped still with discomfort   CURRENT MEDS . antiseptic oral rinse  7 mL Mouth Rinse BID  . bisacodyl  10 mg Rectal Daily  . carvedilol  6.25 mg Oral BID WC  . levothyroxine  88 mcg Intravenous Daily  . tiotropium  18 mcg Inhalation Daily    OBJECTIVE  Filed Vitals:   07/07/14 2342 07/08/14 0400 07/08/14 0415 07/08/14 0800  BP: 109/61 113/60 113/60 114/71  Pulse: 80 74 71 74  Temp: 98.7 F (37.1 C) 97.7 F (36.5 C)    TempSrc:      Resp: 13 18 22 20   Height:      Weight:  93.078 kg (205 lb 3.2 oz)    SpO2: 96% 95% 94% 96%    Intake/Output Summary (Last 24 hours) at 07/08/14 0854 Last data filed at 07/08/14 0839  Gross per 24 hour  Intake  796.5 ml  Output   3952 ml  Net -3155.5 ml   Filed Weights   07/05/14 2347 07/07/14 0400 07/08/14 0400  Weight: 91.3 kg (201 lb 4.5 oz) 93.441 kg (206 lb) 93.078 kg (205 lb 3.2 oz)    PHYSICAL EXAM  General: Pleasant, NAD.  NG tube in  Neuro: Alert and oriented X 3. Moves all extremities spontaneously. Psych: Normal affect. HEENT:  Normal  Neck: Supple without bruits or JVD. Lungs:  Resp regular and unlabored, CTA. Heart: tachycardic no s3, s4, or murmurs. Abdomen: Soft, non-tender, non-distended, BS + x 4.  Extremities: No clubbing, cyanosis or edema. DP/PT/Radials 2+ and equal bilaterally.  Accessory Clinical Findings  CBC  Recent Labs  07/07/14 0325 07/08/14 0345  WBC 6.6 5.0  HGB 13.6 12.9*  HCT 39.6 38.4*  MCV 97.8 98.7  PLT 152 458*   Basic Metabolic Panel  Recent  Labs  07/07/14 0325 07/08/14 0345  NA 144 147  K 3.6* 3.9  CL 104 108  CO2 27 27  GLUCOSE 84 81  BUN 35* 31*  CREATININE 0.91 0.94  CALCIUM 8.7 8.7   Liver Function Tests  Recent Labs  07/07/14 0325 07/08/14 0345  AST 30 30  ALT 22 23  ALKPHOS 104 96  BILITOT 1.3* 1.1  PROT 6.2 6.1  ALBUMIN 2.9* 2.8*   No results for input(s): LIPASE, AMYLASE in the last 72 hours. Cardiac Enzymes No results for input(s): CKTOTAL, CKMB, CKMBINDEX, TROPONINI in the last 72 hours.  TELE NSR no afib     ECG  NSR with nonspecific ST T wave changes  Echocardiogram  LV EF: 55% -  60%  ------------------------------------------------------------ Indications:   V728.1 Pre-op evaluation. 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 Dyspnea 786.09.  ------------------------------------------------------------ History:  PMH: Emphysema. Acquired from the patient and from the patient's chart. Exertional dyspnea. Known aortic aneurysm. Chronic obstructive pulmonary disease. The patient has a lung malignancy and is status postchemotherapy. Risk factors: Former tobacco use. Dyslipidemia.  ------------------------------------------------------------ Study Conclusions  - Left ventricle: The cavity size was normal. Systolic function was normal. The estimated ejection fraction was in the range of 55% to 60%. Wall  motion was normal; there were no regional wall motion abnormalities. There was an increased relative contribution of atrial contraction to ventricular filling. Doppler parameters are consistent with abnormal left ventricular relaxation (grade 1 diastolic dysfunction). - Aortic valve: Moderate diffuse thickening and calcification, consistent with sclerosis. Trivial regurgitation. - Pulmonary arteries: PA peak pressure: 28mm Hg (S). Impressions:  - The right ventricular systolic pressure was increased consistent with mild pulmonary hypertension.     Radiology/Studies    ASSESSMENT AND PLAN 73 y.o. male w/ PMHx significant for PVD s/p aorta to iliac bypass 10/2013, nonobs CAD by cath remotely, lung CA s/p chemo/rad 2012, HTN, hyperlipidemia and HFpEF present with N/V and found to have small bowel obstruction 2/2 incarceration. He was also noted to have a-fib with RVR  1. A-fib with RVR converted to NSR   - Echo 10/2013 EF 85-92%, grade 1 diastolic dysfunct, moderately calcified aortic valve, PA peak pressure 35 mmHg  - TSH WNL  - CHADS2Vasc score is 3 (age, CHF, PVD) need long term systemic anticoagulation therapy once recover from surgery  - increase coreg   - start iv amiodarone 24-48 hrs prior to surgery if inpatient   2. Ventral hernia with small bowel incarceration  ? Surgery Monday or Tuesday after bowel rest with NG tube  Clamp intermittently per surgery   3. Small bowel obstruction 2/2 #2  4. H/o Lung CA: no recurrence noted on CT  5. Dehydration  - h/o HFpEF, at risk for fluid overload. Monitor fluid status   6. AKI: normalized     Will start amiodarone prior to surgery     Would appear that this patient should have surgery this admission  Rozann Lesches PA-C Pager: [phone]

## 2014-07-08 NOTE — Progress Notes (Signed)
   07/08/14 1200  Clinical Encounter Type  Visited With Patient and family together  Visit Type Spiritual support  Referral From Nurse;Patient  Consult/Referral To Chaplain  Spiritual Encounters  Spiritual Needs Prayer;Emotional  Stress Factors  Patient Stress Factors Health changes  Family Stress Factors Loss of control;Health changes (Health problem has recurred since last March)

## 2014-07-08 NOTE — Progress Notes (Signed)
[facility] Progress Note     Subjective: Feels okay today, abdomen less distended.  Had a small BM after the suppository yesterday.  Had >4L out NG tube in the last 24 hours.  No current N/V.  Ambulating well.  Wife at bedside.  Objective: Vital signs in last 24 hours: Temp:  [97.4 F (36.3 C)-98.8 F (37.1 C)] 97.7 F (36.5 C) (11/08 0400) Pulse Rate:  [71-98] 71 (11/08 0415) Resp:  [13-22] 22 (11/08 0415) BP: (103-113)/(60-83) 113/60 mmHg (11/08 0415) SpO2:  [94 %-96 %] 94 % (11/08 0415) Weight:  [205 lb 3.2 oz (93.078 kg)] 205 lb 3.2 oz (93.078 kg) (11/08 0400) Last BM Date: 07/06/14  Intake/Output from previous day: Jul 28, 2023 0701 - 11/08 0700 In: 796.5 [I.V.:796.5] Out: 4552 [Urine:102; Emesis/NG output:4450] Intake/Output this shift:    PE: Gen:  Alert, NAD, pleasant Abd: Soft, mildly less distended today, only minimal tenderness periumbilical, +BS, no HSM, abdominal scars noted, NG output >4L/24hr   Lab Results:   Recent Labs  27-Jul-2014 0325 07/08/14 0345  WBC 6.6 5.0  HGB 13.6 12.9*  HCT 39.6 38.4*  PLT 152 144*   BMET  Recent Labs  07-27-14 0325 07/08/14 0345  NA 144 147  K 3.6* 3.9  CL 104 108  CO2 27 27  GLUCOSE 84 81  BUN 35* 31*  CREATININE 0.91 0.94  CALCIUM 8.7 8.7   PT/INR No results for input(s): LABPROT, INR in the last 72 hours. CMP     Component Value Date/Time   NA 147 07/08/2014 0345   NA 143 07/03/2014 1024   K 3.9 07/08/2014 0345   K 3.9 07/03/2014 1024   CL 108 07/08/2014 0345   CL 109* 08/01/2012 1046   CO2 27 07/08/2014 0345   CO2 25 07/03/2014 1024   GLUCOSE 81 07/08/2014 0345   GLUCOSE 96 07/03/2014 1024   GLUCOSE 93 08/01/2012 1046   BUN 31* 07/08/2014 0345   BUN 19.4 07/03/2014 1024   CREATININE 0.94 07/08/2014 0345   CREATININE 1.0 07/03/2014 1024   CREATININE 1.00 08/17/2013 1250   CALCIUM 8.7 07/08/2014 0345   CALCIUM 9.3 07/03/2014 1024   PROT 6.1 07/08/2014 0345   PROT 7.2 07/03/2014 1024   ALBUMIN 2.8* 07/08/2014 0345   ALBUMIN 3.4* 07/03/2014 1024   AST 30 07/08/2014 0345   AST 29 07/03/2014 1024   ALT 23 07/08/2014 0345   ALT 25 07/03/2014 1024   ALKPHOS 96 07/08/2014 0345   ALKPHOS 145 07/03/2014 1024   BILITOT 1.1 07/08/2014 0345   BILITOT 1.33* 07/03/2014 1024   GFRNONAA 82* 07/08/2014 0345   GFRAA >90 07/08/2014 0345   Lipase     Component Value Date/Time   LIPASE 26 07/04/2014 1858       Studies/Results: Dg Abd 2 Views  Jul 27, 2014   CLINICAL DATA:  Small bowel obstruction.  EXAM: ABDOMEN - 2 VIEW  COMPARISON:  Abdominal CT from 4 days prior  FINDINGS: Dilated bowel in the central abdomen which has progressed in diameter since 07/03/2014. No definite dilatation of colon. Gastric suction tube terminates in the proximal stomach. No evidence for perforation. No new concerning mass effect.  IMPRESSION: 1. Small bowel obstruction which has progressed since comparison 07/03/2014. 2. Orogastric tube in good position   Electronically Signed   By: Jorje Guild M.D.   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 Dr. [place] Silos feels that he likely has adhesions as the cause of the SBO and not his hernia since it is easily reducible.  May be able to perform hernia repair this admission if needed since cleared by cardiology, but risk are higher with urgent repairs than if done electively. Was improving nicely on 07/06/14, but the last 2 days he has been more distended. If no improvement will need ex lap this admission (Monday or Tuesday). Conservative management is not yielding significant enough  improvement.  2. Encouraged ambulating OOB and IS 3. SCD's and lovenox 4. Cards and vascular on board 5. Nurses need to be diligent to prevent the NG tube from clogging seeing as its very thick feculent output which easily clogs even his large bore NG tube.  6. Continue NG suction, output is still 4485mL/24 hour, now bilious and not feculent. 7. KUB without improvement and shows worsening from previous study, Repeat KUB and labs in am. 8.  Will discuss with Dr. Redmond Pulling who is DOW next week.  Hopefully OR Monday or Tuesday.   Will follow.    LOS: 4 days    [name] 07/08/2014, 7:57 AM Pager: [phone]

## 2014-07-08 NOTE — Progress Notes (Signed)
ANTICOAGULATION CONSULT NOTE - Follow Up Consult  Pharmacy Consult for heparin Indication: atrial fibrillation  No Known Allergies  Patient Measurements: Height: 6' (182.9 cm) Weight: 205 lb 3.2 oz (93.078 kg) IBW/kg (Calculated) : 77.6 Heparin Dosing Weight:   Vital Signs: Temp: 97.7 F (36.5 C) (11/08 0400) BP: 114/71 mmHg (11/08 0800) Pulse Rate: 74 (11/08 0800)  Labs:  Recent Labs  07/06/14 0346 07/07/14 0325 07/07/14 0355 07/08/14 0345 07/08/14 1100  HGB 13.7 13.6  --  12.9*  --   HCT 40.3 39.6  --  38.4*  --   PLT 149* 152  --  144*  --   HEPARINUNFRC 0.64  --  0.65 0.75* 0.70  CREATININE 1.49* 0.91  --  0.94  --     Estimated Creatinine Clearance: 79.1 mL/min (by C-G formula based on Cr of 0.94).   Medications:  Scheduled:  . antiseptic oral rinse  7 mL Mouth Rinse BID  . bisacodyl  10 mg Rectal Daily  . carvedilol  6.25 mg Oral BID WC  . levothyroxine  88 mcg Intravenous Daily  . tiotropium  18 mcg Inhalation Daily   Infusions:  . 0.9 % NaCl with KCl 20 mEq / L 75 mL/hr at 07/08/14 0513  . heparin 1,250 Units/hr (07/08/14 0677)    Assessment: 73 yo male with new afib is currently on therapeutic heparin but at the top of the range (0.7). Hg/plt decr slightly 12.9/144. Patient may have AAA surgery Mon or Tues but surgery prefers conservative management and outpt surgery. No bleeding documented.  Goal of Therapy:  Heparin level 0.3-0.7 units/ml Monitor platelets by anticoagulation protocol: Yes   Plan:  -Decrease heparin drip by ~1unit/kg/hr to 1150 units/hr -6-hr HL -Daily HL/CBC -Monitor s/sx bleed -F/u potential surgery next week -F/u long term anticaog plans if no surgery is decided  Elicia Lamp, PharmD Clinical Pharmacist - Resident Pager [phone] 07/08/2014 12:16 PM

## 2014-07-08 NOTE — Progress Notes (Signed)
ANTICOAGULATION CONSULT NOTE - Follow Up Consult  Pharmacy Consult for heparin Indication: atrial fibrillation  Labs:  Recent Labs  07/06/14 0346 07/07/14 0325 07/07/14 0355 07/08/14 0345  HGB 13.7 13.6  --  12.9*  HCT 40.3 39.6  --  38.4*  PLT 149* 152  --  144*  HEPARINUNFRC 0.64  --  0.65 0.75*  CREATININE 1.49* 0.91  --  0.94     Assessment: 73yo male now slightly supratherapeutic after several levels at higher end of goal.  Goal of Therapy:  Heparin level 0.3-0.7 units/ml   Plan:  Will decrease heparin gtt by 1 unit/kg/hr to 1250 units/hr and check level in 6hr.  Wynona Neat, PharmD, BCPS  07/08/2014,5:08 AM

## 2014-07-09 ENCOUNTER — Ambulatory Visit: Payer: Medicare Other | Admitting: Internal Medicine

## 2014-07-09 ENCOUNTER — Inpatient Hospital Stay (HOSPITAL_COMMUNITY): Payer: Medicare Other

## 2014-07-09 LAB — COMPREHENSIVE METABOLIC PANEL
ALT: 40 U/L (ref 0–53)
AST: 54 U/L — ABNORMAL HIGH (ref 0–37)
Albumin: 2.9 g/dL — ABNORMAL LOW (ref 3.5–5.2)
Alkaline Phosphatase: 114 U/L (ref 39–117)
Anion gap: 14 (ref 5–15)
BUN: 30 mg/dL — ABNORMAL HIGH (ref 6–23)
CO2: 27 meq/L (ref 19–32)
CREATININE: 0.89 mg/dL (ref 0.50–1.35)
Calcium: 8.7 mg/dL (ref 8.4–10.5)
Chloride: 106 mEq/L (ref 96–112)
GFR calc Af Amer: >90 mL/min (ref >90)
GFR, EST NON AFRICAN AMERICAN: 84 mL/min — AB (ref >90)
Glucose, Bld: 80 mg/dL (ref 70–99)
Potassium: 4 mEq/L (ref 3.7–5.3)
Sodium: 147 mEq/L (ref 137–147)
Total Bilirubin: 1.4 mg/dL — ABNORMAL HIGH (ref 0.3–1.2)
Total Protein: 6.2 g/dL (ref 6.0–8.3)

## 2014-07-09 LAB — CBC
HCT: 39 % (ref 39.0–52.0)
Hemoglobin: 12.9 g/dL — ABNORMAL LOW (ref 13.0–17.0)
MCH: 32.6 pg (ref 26.0–34.0)
MCHC: 33.1 g/dL (ref 30.0–36.0)
MCV: 98.5 fL (ref 78.0–100.0)
PLATELETS: 157 10*3/uL (ref 150–400)
RBC: 3.96 MIL/uL — AB (ref 4.22–5.81)
RDW: 13.9 % (ref 11.5–15.5)
WBC: 5.8 10*3/uL (ref 4.0–10.5)

## 2014-07-09 LAB — HEPARIN LEVEL (UNFRACTIONATED): Heparin Unfractionated: 0.4 IU/mL (ref 0.30–0.70)

## 2014-07-09 LAB — GLUCOSE, CAPILLARY
GLUCOSE-CAPILLARY: 83 mg/dL (ref 70–99)
Glucose-Capillary: 77 mg/dL (ref 70–99)
Glucose-Capillary: 80 mg/dL (ref 70–99)
Glucose-Capillary: 82 mg/dL (ref 70–99)
Glucose-Capillary: 92 mg/dL (ref 70–99)
Glucose-Capillary: 96 mg/dL (ref 70–99)

## 2014-07-09 LAB — SURGICAL PCR SCREEN
MRSA, PCR: NEGATIVE
Staphylococcus aureus: NEGATIVE

## 2014-07-09 MED ORDER — SODIUM CHLORIDE 0.9 % IJ SOLN
10.0000 mL | Freq: Two times a day (BID) | INTRAMUSCULAR | Status: DC
  Administered 2014-07-13: 10 mL

## 2014-07-09 MED ORDER — AMIODARONE HCL IN DEXTROSE 360-4.14 MG/200ML-% IV SOLN
30.0000 mg/h | INTRAVENOUS | Status: DC
  Administered 2014-07-09 – 2014-07-17 (×14): 30 mg/h via INTRAVENOUS
  Filled 2014-07-09 (×23): qty 200

## 2014-07-09 MED ORDER — AMIODARONE LOAD VIA INFUSION
150.0000 mg | Freq: Once | INTRAVENOUS | Status: AC
  Administered 2014-07-09: 150 mg via INTRAVENOUS
  Filled 2014-07-09: qty 83.34

## 2014-07-09 MED ORDER — HEPARIN (PORCINE) IN NACL 100-0.45 UNIT/ML-% IJ SOLN
1000.0000 [IU]/h | INTRAMUSCULAR | Status: AC
  Administered 2014-07-09: 1000 [IU]/h via INTRAVENOUS

## 2014-07-09 MED ORDER — DEXTROSE 5 % IV SOLN
2.0000 g | INTRAVENOUS | Status: AC
  Administered 2014-07-10: 2 g via INTRAVENOUS
  Filled 2014-07-09: qty 2

## 2014-07-09 MED ORDER — AMIODARONE HCL IN DEXTROSE 360-4.14 MG/200ML-% IV SOLN
60.0000 mg/h | INTRAVENOUS | Status: AC
  Administered 2014-07-09 (×2): 60 mg/h via INTRAVENOUS
  Filled 2014-07-09 (×2): qty 200

## 2014-07-09 MED ORDER — DEXTROSE 5 % IV SOLN
1.0000 g | INTRAVENOUS | Status: DC
  Filled 2014-07-09: qty 1

## 2014-07-09 MED ORDER — SODIUM CHLORIDE 0.9 % IJ SOLN
10.0000 mL | INTRAMUSCULAR | Status: DC | PRN
  Administered 2014-07-09: 10 mL

## 2014-07-09 MED ORDER — CEFOTETAN DISODIUM 2 G IJ SOLR
2.0000 g | INTRAMUSCULAR | Status: DC
  Filled 2014-07-09: qty 2

## 2014-07-09 NOTE — Care Management Note (Addendum)
    Page 1 of 2   07/19/2014     10:49:15 AM CARE MANAGEMENT NOTE 07/19/2014  Patient:  Joseph [name], Joseph [name]   Account Number:  [id number]  Date Initiated:  07/09/2014  Documentation initiated by:  GRAVES-BIGELOW,Karthikeya Funke  Subjective/Objective Assessment:   Pt admitted for abd pain with N/V and was found to be in afib with RVR. Pt with SBO placed NGT for bowel rest. Plan exp Lap 07-10-14. Pt is from home with wife.     Action/Plan:   Plans to return home once stable.   Anticipated DC Date:  07/16/2014   Anticipated DC Plan:  [place]  CM consult      Choice offered to / List presented to:  C-1 Patient   DME arranged  3-N-1  [place]      DME agency  [place] arranged  HH-1 RN  HH-10 DISEASE MANAGEMENT  HH-2 PT  HH-3 OT      [place].   Status of service:  Completed, signed off Medicare Important Message given?  YES (If response is "NO", the following Medicare IM given date fields will be blank) Date Medicare IM given:  07/09/2014 Medicare IM given by:  GRAVES-BIGELOW,Leomar Westberg Date Additional Medicare IM given:  07/16/2014 Additional Medicare IM given by:  [place]  Discharge Disposition:  [place]  Per UR Regulation:  Reviewed for med. necessity/level of care/duration of stay  If discussed at [place] of Stay Meetings, dates discussed:   07/10/2014  07/12/2014  07/17/2014  07/19/2014    Comments:  06-18-14 1048 Medicare Important Message given? YES (If response is "NO", the following Medicare IM given date fields will be blank) Date Medicare IM given: Medicare IM given by: Sherron Ales to deliver wound vac between hours of 9am- 2pm. RN aware. Plan for d/c today with [facility] services.  07-18-14 [place], [place] [phone] Plan for DME 02 to be delivered to room before d/c. CM did fax  information to [facility] for Wound VAC. Plan for d/c Thursday if pt tolerates advancement of diet.  07-17-14 1539 Jacqlyn Krauss, RN,BSN [phone] CM spoke to pt and wife in regards to [facility] services. They have used [facility] in the past. Referral made to [facility] for RN, PT/OT services. SOC to begin within 24-48 hours post d/c. Surgery to consult in am to see if wound vac will be needed for home vs wet to dry dressing. Per wife if pt needs wet to dry dressing she will not feel comfortable with changing the dressing. VAC order form placed in shadow chart. MD please fill out if plan is to send home with wound vac. Wound VAC to be supplied via [employer] if plan for home with DME. DME [place] and 3n1 to be delivered to room before d/c from [facility]. No further needs from CM at this time.  07-16-14 [address], RN,BSN [phone] Contiues on IV amio. Clear liquids initiated. CM will f/u for home care needs.  07/11/14- 1500- Marvetta Gibbons RN, BSN [phone] Pt s/p exp lap day 1, currently remains on IV amio, ? need for TNA, NGT in place

## 2014-07-09 NOTE — Progress Notes (Signed)
Subjective: Complains of dry mouth.  Minimal change- no BM.  Minimal flatus.  Abdomen remains distended.  Anticipating surgery.  Objective: Vital signs in last 24 hours: Temp:  [98.1 F (36.7 C)-98.6 F (37 C)] 98.6 F (37 C) 07/19/2023 0810) Pulse Rate:  [79-96] 80 July 19, 2023 0917) Resp:  [16-22] 16 07-19-2023 0917) BP: (103-128)/(59-79) 110/59 mmHg 2023-07-19 0810) SpO2:  [92 %-97 %] 93 % 07/19/23 0917) Weight change:  Last BM Date: 07/08/14 (very small)  CBG (last 3)   Recent Labs  2014-07-18 0046 07-18-14 0430 07/18/2014 0809  GLUCAP 77 96 82    Intake/Output from previous day: 11/08 0701 - 19-Jul-2023 0700 In: 273.7 [I.V.:273.7] Out: 1950 [Urine:600; Emesis/NG output:1350] Intake/Output this shift:    General appearance: alert and no distress Eyes: no scleral icterus Throat: oropharynx moist without erythema Resp: clear to auscultation bilaterally Cardio: regular rate and rhythm GI: distended with hypoactive bowel sounds; periumbilical incisional hernia protrudes but nontender Extremities: no clubbing, cyanosis or edema   Lab Results:  Recent Labs  07/08/14 0345 07-18-2014 0310  NA 147 147  K 3.9 4.0  CL 108 106  CO2 27 27  GLUCOSE 81 80  BUN 31* 30*  CREATININE 0.94 0.89  CALCIUM 8.7 8.7    Recent Labs  07/08/14 0345 07-18-2014 0310  AST 30 54*  ALT 23 40  ALKPHOS 96 114  BILITOT 1.1 1.4*  PROT 6.1 6.2  ALBUMIN 2.8* 2.9*    Recent Labs  07/08/14 0345 Jul 18, 2014 0310  WBC 5.0 5.8  HGB 12.9* 12.9*  HCT 38.4* 39.0  MCV 98.7 98.5  PLT 144* 157    Studies/Results: Dg Abd 2 Views  07-18-2014   CLINICAL DATA:  Abdominal pain and distension with small bowel obstruction demonstrated on earlier studies  EXAM: ABDOMEN - 2 VIEW  COMPARISON:  Abdominal film of July 07, 2014  FINDINGS: There remain loops of moderately distended gas-filled small bowel throughout the abdomen. The volume of gas has decreased slightly. The esophagogastric tube tip and proximal port lie in the  region of the gastric cardia. There is some stool and gas in the rectosigmoid. No free extraluminal gas collections are demonstrated.  IMPRESSION: Persistent small bowel obstruction. There may have been slight interval decrease in the volume of gas present within distended small bowel loops.   Electronically Signed   By: David  Martinique   On: 07-18-14 09:00    Medications: Scheduled: . antiseptic oral rinse  7 mL Mouth Rinse BID  . bisacodyl  10 mg Rectal Daily  . carvedilol  6.25 mg Oral BID WC  . cefoTEtan (CEFOTAN) IV  2 g Intravenous On Call to OR  . levothyroxine  88 mcg Intravenous Daily  . tiotropium  18 mcg Inhalation Daily   Continuous: . 0.9 % NaCl with KCl 20 mEq / L 75 mL/hr at 07/08/14 2001    Assessment/Plan: Principal Problem: 1. Small bowel obstruction secondary to Umbilical hernia, incarcerated vs. adhesions- minimal improvement clinically or radiographically with conservative measures- anticipate surgery.  Postoperative primary management per General Surgery. Active Problems: 2. Atrial fibrillation with rapid ventricular response- secondary to acute stress related to #1 and volume depletion- remains in Sinus Rhythm with rate improved with hydration and Coreg. Long term anticoagulation per Cardiology.  Resume Heparin postoperatively. Keep K ~4.0.  Cardiology recommends Amiodarone perioperatively to reduce risk of recurrent Afib- discussed with MD on call for Dr. Johnsie Cancel- they will order. 3. Acute Renal Failure- resolved with hydration.  4. Hepatic cirrhosis- LFTs  improved. No evidence of ascites on CT. 5. Lung Cancer- no recurrence noted on CT 6. Hypothyroidism- Synthroid restarted at 1/2 dose IV- resume 159mcg po when tolerating pos. 7. COPD- stable. Continue Spiriva and use Xopenex as needed (avoid albuterol due to AFib). 8. Disposition- per general surgery.    LOS: 5 days   [name] 07/09/2014, 10:02 AM

## 2014-07-09 NOTE — Plan of Care (Signed)
Problem: Phase I Progression Outcomes Goal: Pain controlled with appropriate interventions Outcome: Completed/Met Date Met:  07/09/14 Goal: OOB as tolerated unless otherwise ordered Outcome: Completed/Met Date Met:  07/09/14

## 2014-07-09 NOTE — Progress Notes (Signed)
Patient ID: Joseph [name], male   DOB: 12-12-1940, 73 y.o.   MRN: [id number]    Subjective: Pt feels about the same.  Passed a small amount of flatus last night, otherwise, not much activity.  Objective: Vital signs in last 24 hours: Temp:  [98.1 F (36.7 C)-98.6 F (37 C)] 98.6 F (37 C) 29-Jul-2023 0810) Pulse Rate:  [79-96] 80 07-29-2023 0917) Resp:  [16-22] 16 Jul 29, 2023 0917) BP: (103-128)/(59-79) 110/59 mmHg Jul 29, 2023 0810) SpO2:  [91 %-97 %] 93 % July 29, 2023 0917) Last BM Date: 07/08/14 (very small)  Intake/Output from previous day: 11/08 0701 - 07/29/2023 0700 In: 273.7 [I.V.:273.7] Out: 1950 [Urine:600; Emesis/NG output:1350] Intake/Output this shift:    PE: Abd: soft, minimal distention, hernia is reduced, few BS, NGT with bilious output, but less yesterday than Saturday.  NT Heart: regular  Lab Results:   Recent Labs  07/08/14 0345 07/28/2014 0310  WBC 5.0 5.8  HGB 12.9* 12.9*  HCT 38.4* 39.0  PLT 144* 157   BMET  Recent Labs  07/08/14 0345 07/28/2014 0310  NA 147 147  K 3.9 4.0  CL 108 106  CO2 27 27  GLUCOSE 81 80  BUN 31* 30*  CREATININE 0.94 0.89  CALCIUM 8.7 8.7   PT/INR No results for input(s): LABPROT, INR in the last 72 hours. CMP     Component Value Date/Time   NA 147 July 28, 2014 0310   NA 143 07/03/2014 1024   K 4.0 07/28/14 0310   K 3.9 07/03/2014 1024   CL 106 2014/07/28 0310   CL 109* 08/01/2012 1046   CO2 27 07-28-2014 0310   CO2 25 07/03/2014 1024   GLUCOSE 80 Jul 28, 2014 0310   GLUCOSE 96 07/03/2014 1024   GLUCOSE 93 08/01/2012 1046   BUN 30* 07/28/14 0310   BUN 19.4 07/03/2014 1024   CREATININE 0.89 07/28/14 0310   CREATININE 1.0 07/03/2014 1024   CREATININE 1.00 08/17/2013 1250   CALCIUM 8.7 07/28/2014 0310   CALCIUM 9.3 07/03/2014 1024   PROT 6.2 28-Jul-2014 0310   PROT 7.2 07/03/2014 1024   ALBUMIN 2.9* 2014/07/28 0310   ALBUMIN 3.4* 07/03/2014 1024   AST 54* Jul 28, 2014 0310   AST 29 07/03/2014 1024   ALT 40 28-Jul-2014 0310    ALT 25 07/03/2014 1024   ALKPHOS 114 July 28, 2014 0310   ALKPHOS 145 07/03/2014 1024   BILITOT 1.4* 07-28-2014 0310   BILITOT 1.33* 07/03/2014 1024   GFRNONAA 84* 28-Jul-2014 0310   GFRAA >90 Jul 28, 2014 0310   Lipase     Component Value Date/Time   LIPASE 26 07/04/2014 1858       Studies/Results: Dg Abd 2 Views  07-28-2014   CLINICAL DATA:  Abdominal pain and distension with small bowel obstruction demonstrated on earlier studies  EXAM: ABDOMEN - 2 VIEW  COMPARISON:  Abdominal film of July 07, 2014  FINDINGS: There remain loops of moderately distended gas-filled small bowel throughout the abdomen. The volume of gas has decreased slightly. The esophagogastric tube tip and proximal port lie in the region of the gastric cardia. There is some stool and gas in the rectosigmoid. No free extraluminal gas collections are demonstrated.  IMPRESSION: Persistent small bowel obstruction. There may have been slight interval decrease in the volume of gas present within distended small bowel loops.   Electronically Signed   By: David  Martinique   On: July 28, 2014 09:00    Anti-infectives: Anti-infectives    None       Assessment/Plan  1. SBO Patient  Active Problem List   Diagnosis Date Noted  . COPD (chronic obstructive pulmonary disease) 07/06/2014  . Hypothyroidism 07/06/2014  . Umbilical hernia, incarcerated 07/05/2014  . Small bowel obstruction 07/05/2014  . Atrial fibrillation with rapid ventricular response 07/04/2014  . Bowel habit changes 06/01/2014  . Numbness-Left Thigh 12/14/2013  . Visit for wound check 12/14/2013  . Acute pulmonary edema 11/06/2013  . Hypokalemia 11/06/2013  . Diarrhea 11/05/2013  . Atrial fibrillation with RVR 11/02/2013  . AAA (abdominal aortic aneurysm) 10/31/2013  . Abdominal aortic aneurysm 10/20/2013  . Hyperlipidemia 10/09/2013  . Coronary artery disease 09/29/2013  . Hepatic cirrhosis 12/02/2012  . Personal history of adenomatous colonic polyps  07/04/2012  . Hx of radiation therapy   . Abdominal aneurysm without mention of rupture 08/20/2011  . Malignant neoplasm of upper lobe, bronchus or lung 08/06/2011   Plan: 1. I will review his films with Dr. Redmond Pulling, but they do not look much improved given clinically he is still not doing much from below.  He has been here 5 days now.  I suspect he will need an operation today. I have discussed with the patient and his wife who are agreeable.  2. Cardiology would like for him to be put on an amiodarone drip if he is going to the OR due to concern for post op a fib.   3. Will stop heparin  LOS: 5 days    Izaiyah Kleinman E 07/09/2014, 9:17 AM Pager: [phone]  ADDENDUM: Due to heparin just being turned off at 9:30 and unable to start case until 330-4 this afternoon, we will restart his heparin and stop it at 0200am tomorrow morning and plan for surgery tomorrow morning.  D/W wife on the phone and she is agreeable.

## 2014-07-09 NOTE — Progress Notes (Signed)
ANTICOAGULATION CONSULT NOTE - Follow Up Consult  Pharmacy Consult for heparin Indication: atrial fibrillation  Labs:  Recent Labs  07/07/14 0325  07/08/14 0345 07/08/14 1100 07/08/14 1819 07/09/14 0310  HGB 13.6  --  12.9*  --   --  12.9*  HCT 39.6  --  38.4*  --   --  39.0  PLT 152  --  144*  --   --  157  HEPARINUNFRC  --   < > 0.75* 0.70 0.71* 0.40  CREATININE 0.91  --  0.94  --   --   --   < > = values in this interval not displayed.   Assessment/Plan:  73yo male therapeutic on heparin after rate change. Will continue gtt at current rate and confirm stable with additional level.   Wynona Neat, PharmD, BCPS  07/09/2014,3:59 AM

## 2014-07-09 NOTE — Progress Notes (Signed)
ANTICOAGULATION CONSULT NOTE - Follow Up Consult  Pharmacy Consult for heparin Indication: atrial fibrillation  No Known Allergies  Patient Measurements: Height: 6' (182.9 cm) Weight: 205 lb 3.2 oz (93.078 kg) IBW/kg (Calculated) : 77.6 Heparin Dosing Weight: 93 kg  Vital Signs: Temp: 98.6 F (37 C) (11/09 0810) Temp Source: Oral (11/09 0810) BP: 110/59 mmHg (11/09 0810) Pulse Rate: 80 (11/09 0917)  Labs:  Recent Labs  07/07/14 0325  07/08/14 0345 07/08/14 1100 07/08/14 1819 07/09/14 0310  HGB 13.6  --  12.9*  --   --  12.9*  HCT 39.6  --  38.4*  --   --  39.0  PLT 152  --  144*  --   --  157  HEPARINUNFRC  --   < > 0.75* 0.70 0.71* 0.40  CREATININE 0.91  --  0.94  --   --  0.89  < > = values in this interval not displayed.  Estimated Creatinine Clearance: 83.6 mL/min (by C-G formula based on Cr of 0.89).   Medications:  Scheduled:  . antiseptic oral rinse  7 mL Mouth Rinse BID  . bisacodyl  10 mg Rectal Daily  . carvedilol  6.25 mg Oral BID WC  . [START ON 07/10/2014] cefoTEtan (CEFOTAN) IV  1 g Intravenous On Call to OR  . levothyroxine  88 mcg Intravenous Daily  . sodium chloride  10-40 mL Intracatheter Q12H  . tiotropium  18 mcg Inhalation Daily   Infusions:  . 0.9 % NaCl with KCl 20 mEq / L 75 mL/hr at 07/08/14 2001  . amiodarone 60 mg/hr (07/09/14 1102)   Followed by  . amiodarone    . heparin 1,000 Units/hr (07/09/14 1102)    Assessment: 73 yo male with SBO due to incarcerated umbilical hernia and new onset atrial fibrillation.  He is on therapeutic anticoagulation with heparin.  Noted plans for surgery 11/10 AM.  Have added stop time for heparin of 0200 11/10 per PA request.   Goal of Therapy:  Heparin level 0.3-0.7 units/ml Monitor platelets by anticoagulation protocol: Yes   Plan:  Continue heparin at 1000 units/hr. Heparin to stop 0200 11/10 for surgery. F/u long term anticaog plans post-op.  [employer], Pharm.D., BCPS Clinical  Pharmacist Pager [phone] 07/09/2014 11:47 AM

## 2014-07-09 NOTE — Progress Notes (Addendum)
Patient ID: ALDIN DREES, male   DOB: 04/12/1941, 73 y.o.   MRN: [id number]   Patient Name: Joseph [name] Date of Encounter: 07/09/2014     Principal Problem:   Umbilical hernia, incarcerated Active Problems:   Hepatic cirrhosis   Atrial fibrillation with rapid ventricular response   Small bowel obstruction   COPD (chronic obstructive pulmonary disease)   Hypothyroidism    SUBJECTIVE  No cardiac complaints  Abdomen still distended and tender with NG in place   CURRENT MEDS . antiseptic oral rinse  7 mL Mouth Rinse BID  . bisacodyl  10 mg Rectal Daily  . carvedilol  6.25 mg Oral BID WC  . levothyroxine  88 mcg Intravenous Daily  . tiotropium  18 mcg Inhalation Daily    OBJECTIVE  Filed Vitals:   07/08/14 2001 07/09/14 0046 07/09/14 0431 07/09/14 0810  BP: 128/79 112/73 115/72 110/59  Pulse: 80 82 83 79  Temp: 98.2 F (36.8 C) 98.6 F (37 C) 98.2 F (36.8 C) 98.6 F (37 C)  TempSrc: Oral Oral Oral Oral  Resp: 17 16 16 16   Height:      Weight:      SpO2: 97% 96% 92% 92%    Intake/Output Summary (Last 24 hours) at 07/09/14 0841 Last data filed at 07/09/14 0400  Gross per 24 hour  Intake 273.72 ml  Output   1750 ml  Net -1476.28 ml   Filed Weights   07/05/14 2347 07/07/14 0400 07/08/14 0400  Weight: 91.3 kg (201 lb 4.5 oz) 93.441 kg (206 lb) 93.078 kg (205 lb 3.2 oz)    PHYSICAL EXAM  General: Pleasant, NAD.  NG tube in  Neuro: Alert and oriented X 3. Moves all extremities spontaneously. Psych: Normal affect. HEENT:  Normal  Neck: Supple without bruits or JVD. Lungs:  Resp regular and unlabored, CTA. Heart: tachycardic no s3, s4, or murmurs. Abdomen: Soft, non-tender, non-distended, BS + x 4.  Extremities: No clubbing, cyanosis or edema. DP/PT/Radials 2+ and equal bilaterally.  Accessory Clinical Findings  CBC  Recent Labs  07/08/14 0345 07/09/14 0310  WBC 5.0 5.8  HGB 12.9* 12.9*  HCT 38.4* 39.0  MCV 98.7 98.5  PLT 144* 157     Basic Metabolic Panel  Recent Labs  07/08/14 0345 07/09/14 0310  NA 147 147  K 3.9 4.0  CL 108 106  CO2 27 27  GLUCOSE 81 80  BUN 31* 30*  CREATININE 0.94 0.89  CALCIUM 8.7 8.7   Liver Function Tests  Recent Labs  07/08/14 0345 07/09/14 0310  AST 30 54*  ALT 23 40  ALKPHOS 96 114  BILITOT 1.1 1.4*  PROT 6.1 6.2  ALBUMIN 2.8* 2.9*    TELE NSR no afib     ECG  NSR with nonspecific ST T wave changes  Echocardiogram  LV EF: 55% -  60%  ------------------------------------------------------------ Indications:   V728.1 Pre-op evaluation. 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 Dyspnea 786.09.  ------------------------------------------------------------ History:  PMH: Emphysema. Acquired from the patient and from the patient's chart. Exertional dyspnea. Known aortic aneurysm. Chronic obstructive pulmonary disease. The patient has a lung malignancy and is status postchemotherapy. Risk factors: Former tobacco use. Dyslipidemia.  ------------------------------------------------------------ Study Conclusions  - Left ventricle: The cavity size was normal. Systolic function was normal. The estimated ejection fraction was in the range of 55% to 60%. Wall motion was normal; there were no regional wall motion abnormalities. There was an increased relative contribution of atrial contraction to ventricular filling.  Doppler parameters are consistent with abnormal left ventricular relaxation (grade 1 diastolic dysfunction). - Aortic valve: Moderate diffuse thickening and calcification, consistent with sclerosis. Trivial regurgitation. - Pulmonary arteries: PA peak pressure: 62mm Hg (S). Impressions:  - The right ventricular systolic pressure was increased consistent with mild pulmonary hypertension.    Radiology/Studies    ASSESSMENT AND PLAN 73 y.o. male w/ PMHx significant for PVD s/p aorta to iliac bypass 10/2013, nonobs CAD by  cath remotely, lung CA s/p chemo/rad 2012, HTN, hyperlipidemia and HFpEF present with N/V and found to have small bowel obstruction 2/2 incarceration. He was also noted to have a-fib with RVR  1. A-fib with RVR converted to NSR   - Echo 10/2013 EF 44-01%, grade 1 diastolic dysfunct, moderately calcified aortic valve, PA peak pressure 35 mmHg  - TSH WNL  - CHADS2Vasc score is 3 (age, CHF, PVD) need long term systemic anticoagulation therapy once recover from surgery  - increase coreg   - start iv amiodarone 24-48 hrs prior to surgery if inpatient   2. Ventral hernia with small bowel incarceration  Should have surgery with Dr Redmond Pulling this week  after bowel rest with NG tube  Clamp intermittently per surgery   3. Small bowel obstruction 2/2 #2  4. H/o Lung CA: no recurrence noted on CT  5. Dehydration  - h/o HFpEF, at risk for fluid overload. Monitor fluid status   6. AKI: normalized     Discussed with surgery  They will operate tomorrow  Will start amiodarone today bolus and infusion  Orders written and discussed with nursing   Signed, Jenkins Rouge PA-C Pager: [phone]

## 2014-07-09 NOTE — Progress Notes (Signed)
[facility]  Subjective  - about the same, tired of NG   Objective 113/63 70 98.2 F (36.8 C) (Oral) 14 96%  Intake/Output Summary (Last 24 hours) at 07/09/14 1950 Last data filed at 07/09/14 1853  Gross per 24 hour  Intake 283.72 ml  Output   1475 ml  Net -1191.28 ml   Abdomen soft, dark NG drainage  Assessment/Planning: Incisional hernia repair scheduled for tomorrow Will follow  Brad Lieurance E 07/09/2014 7:50 PM --  Laboratory Lab Results:  Recent Labs  07/08/14 0345 07/09/14 0310  WBC 5.0 5.8  HGB 12.9* 12.9*  HCT 38.4* 39.0  PLT 144* 157   BMET  Recent Labs  07/08/14 0345 07/09/14 0310  NA 147 147  K 3.9 4.0  CL 108 106  CO2 27 27  GLUCOSE 81 80  BUN 31* 30*  CREATININE 0.94 0.89  CALCIUM 8.7 8.7    COAG Lab Results  Component Value Date   INR 1.29 11/01/2013   INR 1.37 11/01/2013   INR 1.32 10/31/2013   No results found for: PTT

## 2014-07-10 ENCOUNTER — Inpatient Hospital Stay (HOSPITAL_COMMUNITY): Payer: Medicare Other | Admitting: Anesthesiology

## 2014-07-10 ENCOUNTER — Encounter (HOSPITAL_COMMUNITY): Payer: Self-pay | Admitting: Anesthesiology

## 2014-07-10 ENCOUNTER — Encounter (HOSPITAL_COMMUNITY)
Admission: EM | Disposition: A | Discharge status: Home / Self Care | Payer: Self-pay | Source: Home / Self Care | Attending: Internal Medicine

## 2014-07-10 HISTORY — PX: INCISIONAL HERNIA REPAIR: SHX193

## 2014-07-10 HISTORY — PX: LYSIS OF ADHESION: SHX5961

## 2014-07-10 HISTORY — PX: BOWEL RESECTION: SHX1257

## 2014-07-10 HISTORY — PX: LAPAROTOMY: SHX154

## 2014-07-10 LAB — CBC
HCT: 40.7 % (ref 39.0–52.0)
HEMATOCRIT: 37.6 % — AB (ref 39.0–52.0)
Hemoglobin: 12.5 g/dL — ABNORMAL LOW (ref 13.0–17.0)
Hemoglobin: 13.4 g/dL (ref 13.0–17.0)
MCH: 32.6 pg (ref 26.0–34.0)
MCH: 33.4 pg (ref 26.0–34.0)
MCHC: 33.2 g/dL (ref 30.0–36.0)
MCHC: 32.9 g/dL (ref 30.0–36.0)
MCV: 98.2 fL (ref 78.0–100.0)
MCV: 101.5 fL — ABNORMAL HIGH (ref 78.0–100.0)
PLATELETS: 138 10*3/uL — AB (ref 150–400)
Platelets: 141 10*3/uL — ABNORMAL LOW (ref 150–400)
RBC: 3.83 MIL/uL — ABNORMAL LOW (ref 4.22–5.81)
RBC: 4.01 MIL/uL — AB (ref 4.22–5.81)
RDW: 14 % (ref 11.5–15.5)
RDW: 14.1 % (ref 11.5–15.5)
WBC: 5.2 10*3/uL (ref 4.0–10.5)
WBC: 2.4 10*3/uL — ABNORMAL LOW (ref 4.0–10.5)

## 2014-07-10 LAB — BASIC METABOLIC PANEL
Anion gap: 14 (ref 5–15)
BUN: 22 mg/dL (ref 6–23)
CALCIUM: 8.5 mg/dL (ref 8.4–10.5)
CO2: 24 mEq/L (ref 19–32)
Chloride: 109 mEq/L (ref 96–112)
Creatinine, Ser: 0.7 mg/dL (ref 0.50–1.35)
GFR calc Af Amer: >90 mL/min (ref >90)
Glucose, Bld: 107 mg/dL — ABNORMAL HIGH (ref 70–99)
Potassium: 3.6 mEq/L — ABNORMAL LOW (ref 3.7–5.3)
Sodium: 147 mEq/L (ref 137–147)

## 2014-07-10 LAB — COMPREHENSIVE METABOLIC PANEL
ALT: 57 U/L — ABNORMAL HIGH (ref 0–53)
ANION GAP: 15 (ref 5–15)
AST: 62 U/L — ABNORMAL HIGH (ref 0–37)
Albumin: 2.8 g/dL — ABNORMAL LOW (ref 3.5–5.2)
Alkaline Phosphatase: 114 U/L (ref 39–117)
BUN: 26 mg/dL — AB (ref 6–23)
CALCIUM: 8.6 mg/dL (ref 8.4–10.5)
CO2: 25 mEq/L (ref 19–32)
Chloride: 109 mEq/L (ref 96–112)
Creatinine, Ser: 0.77 mg/dL (ref 0.50–1.35)
GFR calc Af Amer: >90 mL/min (ref >90)
GFR calc non Af Amer: 89 mL/min — ABNORMAL LOW (ref >90)
Glucose, Bld: 93 mg/dL (ref 70–99)
Potassium: 3.7 mEq/L (ref 3.7–5.3)
SODIUM: 149 meq/L — AB (ref 137–147)
TOTAL PROTEIN: 6.1 g/dL (ref 6.0–8.3)
Total Bilirubin: 1.1 mg/dL (ref 0.3–1.2)

## 2014-07-10 LAB — GLUCOSE, CAPILLARY
Glucose-Capillary: 85 mg/dL (ref 70–99)
Glucose-Capillary: 92 mg/dL (ref 70–99)

## 2014-07-10 SURGERY — LAPAROTOMY, EXPLORATORY
Anesthesia: General | Site: Abdomen

## 2014-07-10 MED ORDER — DEXAMETHASONE SODIUM PHOSPHATE 10 MG/ML IJ SOLN
INTRAMUSCULAR | Status: DC | PRN
  Administered 2014-07-10: 8 mg via INTRAVENOUS

## 2014-07-10 MED ORDER — OXYCODONE HCL 5 MG PO TABS: 5.0000 mg | ORAL_TABLET | Freq: Once | ORAL | Status: DC | PRN

## 2014-07-10 MED ORDER — FENTANYL 10 MCG/ML IV SOLN
INTRAVENOUS | Status: DC
  Administered 2014-07-10: 210 ug via INTRAVENOUS
  Administered 2014-07-10: 13:00:00 via INTRAVENOUS
  Administered 2014-07-10: 120 ug via INTRAVENOUS
  Administered 2014-07-10: 45 ug via INTRAVENOUS
  Administered 2014-07-11: 207.5 ug via INTRAVENOUS
  Administered 2014-07-11: 135 ug via INTRAVENOUS
  Administered 2014-07-11: 02:00:00 via INTRAVENOUS
  Administered 2014-07-11: 105 ug via INTRAVENOUS
  Administered 2014-07-11: 450 ug via INTRAVENOUS
  Administered 2014-07-12: 158.7 ug via INTRAVENOUS
  Administered 2014-07-12: 60 ug via INTRAVENOUS
  Administered 2014-07-12: 135 ug via INTRAVENOUS
  Administered 2014-07-12: 45 ug via INTRAVENOUS
  Administered 2014-07-12: 16:00:00 via INTRAVENOUS
  Administered 2014-07-12: 120 ug via INTRAVENOUS
  Administered 2014-07-13: 60 ug via INTRAVENOUS
  Administered 2014-07-13: 135 ug via INTRAVENOUS
  Administered 2014-07-13: 75 ug via INTRAVENOUS
  Administered 2014-07-13: 30 ug via INTRAVENOUS
  Filled 2014-07-10 (×5): qty 50

## 2014-07-10 MED ORDER — DEXAMETHASONE SODIUM PHOSPHATE 4 MG/ML IJ SOLN
INTRAMUSCULAR | Status: AC
Start: 2014-07-10 — End: 2014-07-10
  Filled 2014-07-10: qty 2

## 2014-07-10 MED ORDER — VECURONIUM BROMIDE 10 MG IV SOLR
INTRAVENOUS | Status: DC | PRN
  Administered 2014-07-10 (×3): 2 mg via INTRAVENOUS

## 2014-07-10 MED ORDER — ONDANSETRON HCL 4 MG/2ML IJ SOLN: 4.0000 mg | Freq: Once | INTRAMUSCULAR | Status: DC | PRN

## 2014-07-10 MED ORDER — ONDANSETRON HCL 4 MG/2ML IJ SOLN: 4.0000 mg | Freq: Four times a day (QID) | INTRAMUSCULAR | Status: DC | PRN

## 2014-07-10 MED ORDER — PROPOFOL 10 MG/ML IV BOLUS
INTRAVENOUS | Status: DC | PRN
  Administered 2014-07-10: 100 mg via INTRAVENOUS

## 2014-07-10 MED ORDER — NEOSTIGMINE METHYLSULFATE 10 MG/10ML IV SOLN
INTRAVENOUS | Status: DC | PRN
  Administered 2014-07-10: 4 mg via INTRAVENOUS

## 2014-07-10 MED ORDER — ARTIFICIAL TEARS OP OINT
TOPICAL_OINTMENT | OPHTHALMIC | Status: AC
  Filled 2014-07-10: qty 3.5

## 2014-07-10 MED ORDER — VECURONIUM BROMIDE 10 MG IV SOLR
INTRAVENOUS | Status: AC
  Filled 2014-07-10: qty 10

## 2014-07-10 MED ORDER — MIDAZOLAM HCL 5 MG/5ML IJ SOLN
INTRAMUSCULAR | Status: DC | PRN
  Administered 2014-07-10: 2 mg via INTRAVENOUS

## 2014-07-10 MED ORDER — GLYCOPYRROLATE 0.2 MG/ML IJ SOLN
INTRAMUSCULAR | Status: AC
  Filled 2014-07-10: qty 4

## 2014-07-10 MED ORDER — FENTANYL CITRATE 0.05 MG/ML IJ SOLN
INTRAMUSCULAR | Status: AC
Start: 2014-07-10 — End: 2014-07-10
  Filled 2014-07-10: qty 5

## 2014-07-10 MED ORDER — DEXTROSE 5 % IV SOLN
2.0000 g | Freq: Two times a day (BID) | INTRAVENOUS | Status: AC
  Administered 2014-07-10 – 2014-07-11 (×4): 2 g via INTRAVENOUS
  Filled 2014-07-10 (×5): qty 2

## 2014-07-10 MED ORDER — AMIODARONE HCL IN DEXTROSE 360-4.14 MG/200ML-% IV SOLN
INTRAVENOUS | Status: DC | PRN
  Administered 2014-07-10: 30 mg/h via INTRAVENOUS
  Administered 2014-07-10: 10:00:00 via INTRAVENOUS

## 2014-07-10 MED ORDER — PHENYLEPHRINE HCL 10 MG/ML IJ SOLN
INTRAMUSCULAR | Status: DC | PRN
  Administered 2014-07-10: 40 ug via INTRAVENOUS
  Administered 2014-07-10: 120 ug via INTRAVENOUS
  Administered 2014-07-10: 80 ug via INTRAVENOUS
  Administered 2014-07-10: 40 ug via INTRAVENOUS
  Administered 2014-07-10 (×3): 80 ug via INTRAVENOUS

## 2014-07-10 MED ORDER — ARTIFICIAL TEARS OP OINT
TOPICAL_OINTMENT | OPHTHALMIC | Status: DC | PRN
  Administered 2014-07-10: 1 via OPHTHALMIC

## 2014-07-10 MED ORDER — POTASSIUM CHLORIDE 10 MEQ/100ML IV SOLN: 10.0000 meq | INTRAVENOUS | Status: AC

## 2014-07-10 MED ORDER — SODIUM CHLORIDE 0.9 % IJ SOLN
INTRAMUSCULAR | Status: AC
  Filled 2014-07-10: qty 20

## 2014-07-10 MED ORDER — ONDANSETRON HCL 4 MG/2ML IJ SOLN
INTRAMUSCULAR | Status: DC | PRN
  Administered 2014-07-10: 4 mg via INTRAVENOUS

## 2014-07-10 MED ORDER — SODIUM CHLORIDE 0.9 % IV SOLN
INTRAVENOUS | Status: DC | PRN
  Administered 2014-07-10: 08:00:00 via INTRAVENOUS

## 2014-07-10 MED ORDER — ONDANSETRON HCL 4 MG/2ML IJ SOLN
INTRAMUSCULAR | Status: AC
  Filled 2014-07-10: qty 2

## 2014-07-10 MED ORDER — HYDROMORPHONE HCL 1 MG/ML IJ SOLN
INTRAMUSCULAR | Status: AC
  Filled 2014-07-10: qty 1

## 2014-07-10 MED ORDER — PHENYLEPHRINE 40 MCG/ML (10ML) SYRINGE FOR IV PUSH (FOR BLOOD PRESSURE SUPPORT)
PREFILLED_SYRINGE | INTRAVENOUS | Status: AC
  Filled 2014-07-10: qty 10

## 2014-07-10 MED ORDER — STERILE WATER FOR INJECTION IJ SOLN
INTRAMUSCULAR | Status: AC
  Filled 2014-07-10: qty 10

## 2014-07-10 MED ORDER — MIDAZOLAM HCL 2 MG/2ML IJ SOLN
INTRAMUSCULAR | Status: AC
  Filled 2014-07-10: qty 2

## 2014-07-10 MED ORDER — EPHEDRINE SULFATE 50 MG/ML IJ SOLN
INTRAMUSCULAR | Status: DC | PRN
  Administered 2014-07-10 (×4): 10 mg via INTRAVENOUS
  Administered 2014-07-10: 15 mg via INTRAVENOUS
  Administered 2014-07-10: 10 mg via INTRAVENOUS

## 2014-07-10 MED ORDER — 0.9 % SODIUM CHLORIDE (POUR BTL) OPTIME
TOPICAL | Status: DC | PRN
  Administered 2014-07-10 (×5): 1000 mL

## 2014-07-10 MED ORDER — EPHEDRINE SULFATE 50 MG/ML IJ SOLN
INTRAMUSCULAR | Status: AC
  Filled 2014-07-10: qty 2

## 2014-07-10 MED ORDER — NALOXONE HCL 0.4 MG/ML IJ SOLN: 0.4000 mg | INTRAMUSCULAR | Status: DC | PRN

## 2014-07-10 MED ORDER — LIDOCAINE HCL (CARDIAC) 20 MG/ML IV SOLN
INTRAVENOUS | Status: DC | PRN
  Administered 2014-07-10: 100 mg via INTRAVENOUS

## 2014-07-10 MED ORDER — ENOXAPARIN SODIUM 40 MG/0.4ML ~~LOC~~ SOLN
40.0000 mg | SUBCUTANEOUS | Status: DC
  Administered 2014-07-11 – 2014-07-19 (×9): 40 mg via SUBCUTANEOUS
  Filled 2014-07-10 (×9): qty 0.4

## 2014-07-10 MED ORDER — OXYCODONE HCL 5 MG/5ML PO SOLN: 5.0000 mg | Freq: Once | ORAL | Status: DC | PRN

## 2014-07-10 MED ORDER — LIDOCAINE HCL (CARDIAC) 20 MG/ML IV SOLN
INTRAVENOUS | Status: AC
  Filled 2014-07-10: qty 10

## 2014-07-10 MED ORDER — SODIUM CHLORIDE 0.9 % IJ SOLN: 9.0000 mL | INTRAMUSCULAR | Status: DC | PRN

## 2014-07-10 MED ORDER — PHENYLEPHRINE HCL 10 MG/ML IJ SOLN
INTRAMUSCULAR | Status: AC
  Filled 2014-07-10: qty 1

## 2014-07-10 MED ORDER — SUCCINYLCHOLINE CHLORIDE 20 MG/ML IJ SOLN
INTRAMUSCULAR | Status: AC
  Filled 2014-07-10: qty 2

## 2014-07-10 MED ORDER — NEOSTIGMINE METHYLSULFATE 10 MG/10ML IV SOLN
INTRAVENOUS | Status: AC
  Filled 2014-07-10: qty 1

## 2014-07-10 MED ORDER — LACTATED RINGERS IV SOLN
INTRAVENOUS | Status: DC | PRN
  Administered 2014-07-10 (×4): via INTRAVENOUS

## 2014-07-10 MED ORDER — EPHEDRINE SULFATE 50 MG/ML IJ SOLN
INTRAMUSCULAR | Status: AC
  Filled 2014-07-10: qty 1

## 2014-07-10 MED ORDER — DIPHENHYDRAMINE HCL 12.5 MG/5ML PO ELIX
12.5000 mg | ORAL_SOLUTION | Freq: Four times a day (QID) | ORAL | Status: DC | PRN
  Filled 2014-07-10: qty 5

## 2014-07-10 MED ORDER — SODIUM CHLORIDE 0.9 % IJ SOLN
INTRAMUSCULAR | Status: AC
  Filled 2014-07-10: qty 10

## 2014-07-10 MED ORDER — ALBUMIN HUMAN 5 % IV SOLN
INTRAVENOUS | Status: DC | PRN
Start: 2014-07-10 — End: 2014-07-10
  Administered 2014-07-10: 09:00:00 via INTRAVENOUS

## 2014-07-10 MED ORDER — FENTANYL CITRATE 0.05 MG/ML IJ SOLN
INTRAMUSCULAR | Status: AC
  Filled 2014-07-10: qty 5

## 2014-07-10 MED ORDER — ROCURONIUM BROMIDE 50 MG/5ML IV SOLN
INTRAVENOUS | Status: AC
  Filled 2014-07-10: qty 1

## 2014-07-10 MED ORDER — ROCURONIUM BROMIDE 100 MG/10ML IV SOLN
INTRAVENOUS | Status: DC | PRN
  Administered 2014-07-10: 50 mg via INTRAVENOUS

## 2014-07-10 MED ORDER — SUCCINYLCHOLINE CHLORIDE 20 MG/ML IJ SOLN
INTRAMUSCULAR | Status: DC | PRN
  Administered 2014-07-10: 160 mg via INTRAVENOUS

## 2014-07-10 MED ORDER — HYDROMORPHONE HCL 1 MG/ML IJ SOLN
0.2500 mg | INTRAMUSCULAR | Status: DC | PRN
  Administered 2014-07-10: 0.25 mg via INTRAVENOUS
  Administered 2014-07-10: 0.5 mg via INTRAVENOUS
  Administered 2014-07-10: 0.25 mg via INTRAVENOUS

## 2014-07-10 MED ORDER — MEPERIDINE HCL 25 MG/ML IJ SOLN: 6.2500 mg | INTRAMUSCULAR | Status: DC | PRN

## 2014-07-10 MED ORDER — GLYCOPYRROLATE 0.2 MG/ML IJ SOLN
INTRAMUSCULAR | Status: DC | PRN
  Administered 2014-07-10: 0.6 mg via INTRAVENOUS

## 2014-07-10 MED ORDER — FENTANYL CITRATE 0.05 MG/ML IJ SOLN
INTRAMUSCULAR | Status: DC | PRN
  Administered 2014-07-10 (×5): 50 ug via INTRAVENOUS
  Administered 2014-07-10: 150 ug via INTRAVENOUS

## 2014-07-10 MED ORDER — PROPOFOL 10 MG/ML IV BOLUS
INTRAVENOUS | Status: AC
  Filled 2014-07-10: qty 20

## 2014-07-10 MED ORDER — DIPHENHYDRAMINE HCL 50 MG/ML IJ SOLN: 12.5000 mg | Freq: Four times a day (QID) | INTRAMUSCULAR | Status: DC | PRN

## 2014-07-10 SURGICAL SUPPLY — 54 items
BLADE SURG ROTATE 9660 (MISCELLANEOUS) ×1 IMPLANT
BNDG GAUZE ELAST 4 BULKY (GAUZE/BANDAGES/DRESSINGS) ×1 IMPLANT
BRR ADH 5X3 SEPRAFILM 6 SHT (MISCELLANEOUS) ×1
CANISTER SUCTION 2500CC (MISCELLANEOUS) ×2 IMPLANT
CATH ROBINSON RED A/P 16FR (CATHETERS) ×1 IMPLANT
CHLORAPREP W/TINT 26ML (MISCELLANEOUS) ×2 IMPLANT
COVER SURGICAL LIGHT HANDLE (MISCELLANEOUS) ×2 IMPLANT
DRAPE LAPAROSCOPIC ABDOMINAL (DRAPES) ×2 IMPLANT
DRAPE UTILITY 15X26 W/TAPE STR (DRAPE) ×4 IMPLANT
DRAPE WARM FLUID 44X44 (DRAPE) ×2 IMPLANT
DRSG OPSITE POSTOP 4X8 (GAUZE/BANDAGES/DRESSINGS) IMPLANT
DRSG PAD ABDOMINAL 8X10 ST (GAUZE/BANDAGES/DRESSINGS) ×1 IMPLANT
ELECT CAUTERY BLADE 6.4 (BLADE) ×4 IMPLANT
ELECT REM PT RETURN 9FT ADLT (ELECTROSURGICAL) ×2
ELECTRODE REM PT RTRN 9FT ADLT (ELECTROSURGICAL) ×1 IMPLANT
GLOVE BIO SURGEON STRL SZ 6.5 (GLOVE) ×2 IMPLANT
GLOVE BIOGEL M STRL SZ7.5 (GLOVE) ×3 IMPLANT
GLOVE BIOGEL PI IND STRL 7.0 (GLOVE) IMPLANT
GLOVE BIOGEL PI IND STRL 8 (GLOVE) ×1 IMPLANT
GLOVE BIOGEL PI INDICATOR 7.0 (GLOVE) ×2
GLOVE BIOGEL PI INDICATOR 8 (GLOVE) ×1
GLOVE ECLIPSE 6.5 STRL STRAW (GLOVE) ×1 IMPLANT
GOWN STRL REUS W/ TWL LRG LVL3 (GOWN DISPOSABLE) ×2 IMPLANT
GOWN STRL REUS W/ TWL XL LVL3 (GOWN DISPOSABLE) ×1 IMPLANT
GOWN STRL REUS W/TWL LRG LVL3 (GOWN DISPOSABLE) ×4
GOWN STRL REUS W/TWL XL LVL3 (GOWN DISPOSABLE) ×2
KIT BASIN OR (CUSTOM PROCEDURE TRAY) ×2 IMPLANT
KIT ROOM TURNOVER OR (KITS) ×2 IMPLANT
LIGASURE IMPACT 36 18CM CVD LR (INSTRUMENTS) ×1 IMPLANT
NS IRRIG 1000ML POUR BTL (IV SOLUTION) ×4 IMPLANT
PACK GENERAL/GYN (CUSTOM PROCEDURE TRAY) ×2 IMPLANT
PAD ARMBOARD 7.5X6 YLW CONV (MISCELLANEOUS) ×2 IMPLANT
PENCIL BUTTON HOLSTER BLD 10FT (ELECTRODE) IMPLANT
RELOAD PROXIMATE 75MM BLUE (ENDOMECHANICALS) ×10 IMPLANT
RELOAD STAPLE 75 3.8 BLU REG (ENDOMECHANICALS) IMPLANT
SEPRAFILM PROCEDURAL PACK 3X5 (MISCELLANEOUS) ×1 IMPLANT
SPECIMEN JAR LARGE (MISCELLANEOUS) IMPLANT
SPONGE GAUZE 4X4 12PLY STER LF (GAUZE/BANDAGES/DRESSINGS) ×1 IMPLANT
SPONGE LAP 18X18 X RAY DECT (DISPOSABLE) ×1 IMPLANT
STAPLER PROXIMATE 75MM BLUE (STAPLE) ×1 IMPLANT
STAPLER TA90 4.8 THK SLIMI (STAPLE) ×1 IMPLANT
STAPLER VISISTAT 35W (STAPLE) ×2 IMPLANT
SUCTION POOLE TIP (SUCTIONS) ×2 IMPLANT
SUT NOVA 1 T20/GS 25DT (SUTURE) ×2 IMPLANT
SUT PDS AB 1 TP1 96 (SUTURE) ×5 IMPLANT
SUT SILK 2 0 SH CR/8 (SUTURE) ×3 IMPLANT
SUT SILK 2 0 TIES 10X30 (SUTURE) ×2 IMPLANT
SUT SILK 3 0 SH CR/8 (SUTURE) ×3 IMPLANT
SUT SILK 3 0 TIES 10X30 (SUTURE) ×2 IMPLANT
SUT SILK 3 0SH CR/8 30 (SUTURE) ×1 IMPLANT
TAPE CLOTH SURG 6X10 WHT LF (GAUZE/BANDAGES/DRESSINGS) ×1 IMPLANT
TOWEL OR 17X26 10 PK STRL BLUE (TOWEL DISPOSABLE) ×2 IMPLANT
TRAY FOLEY CATH 16FRSI W/METER (SET/KITS/TRAYS/PACK) ×1 IMPLANT
YANKAUER SUCT BULB TIP NO VENT (SUCTIONS) IMPLANT

## 2014-07-10 NOTE — Anesthesia Preprocedure Evaluation (Addendum)
Anesthesia Evaluation  Patient identified by MRN, date of birth, ID band Patient awake    Reviewed: Allergy & Precautions, H&P , NPO status , Patient's Chart, lab work & pertinent test results  Airway Mallampati: II  TM Distance: >3 FB Neck ROM: Full    Dental  (+) Teeth Intact, Caps, Dental Advidsory Given   Pulmonary shortness of breath and with exertion, COPD COPD inhaler, former smoker,          Cardiovascular + CAD and + Peripheral Vascular Disease Atrial Fibrillation + Valvular Problems/Murmurs     Neuro/Psych    GI/Hepatic   Endo/Other  Hypothyroidism   Renal/GU      Musculoskeletal   Abdominal   Peds  Hematology   Anesthesia Other Findings   Reproductive/Obstetrics                            Anesthesia Physical Anesthesia Plan  ASA: III  Anesthesia Plan: General ETT and General   Post-op Pain Management:    Induction: Intravenous, Rapid sequence and Cricoid pressure planned  Airway Management Planned: Oral ETT  Additional Equipment:   Intra-op Plan:   Post-operative Plan: Extubation in OR  Informed Consent: I have reviewed the patients History and Physical, chart, labs and discussed the procedure including the risks, benefits and alternatives for the proposed anesthesia with the patient or authorized representative who has indicated his/her understanding and acceptance.   Dental Advisory Given  Plan Discussed with: CRNA and Surgeon  Anesthesia Plan Comments:        Anesthesia Quick Evaluation

## 2014-07-10 NOTE — Anesthesia Procedure Notes (Signed)
Procedure Name: Intubation Date/Time: 07/10/2014 8:13 AM Performed by: Neldon Newport Pre-anesthesia Checklist: Patient being monitored, Suction available, Emergency Drugs available, Patient identified and Timeout performed Patient Re-evaluated:Patient Re-evaluated prior to inductionOxygen Delivery Method: Circle system utilized Preoxygenation: Pre-oxygenation with 100% oxygen Intubation Type: IV induction, Rapid sequence and Cricoid Pressure applied Laryngoscope Size: Mac and 3 Grade View: Grade II Tube type: Oral Tube size: 7.5 mm Number of attempts: 1 Placement Confirmation: positive ETCO2,  ETT inserted through vocal cords under direct vision and breath sounds checked- equal and bilateral Secured at: 22 cm Tube secured with: Tape Dental Injury: Teeth and Oropharynx as per pre-operative assessment

## 2014-07-10 NOTE — H&P (View-Only) (Signed)
Patient ID: XAVYER STEENSON, male   DOB: 1941/06/12, 73 y.o.   MRN: [id number]    Subjective: Pt feels about the same.  Passed a small amount of flatus last night, otherwise, not much activity.  Objective: Vital signs in last 24 hours: Temp:  [98.1 F (36.7 C)-98.6 F (37 C)] 98.6 F (37 C) 2023/07/22 0810) Pulse Rate:  [79-96] 80 07/22/23 0917) Resp:  [16-22] 16 07-22-23 0917) BP: (103-128)/(59-79) 110/59 mmHg 2023-07-22 0810) SpO2:  [91 %-97 %] 93 % 07-22-2023 0917) Last BM Date: 07/08/14 (very small)  Intake/Output from previous day: 11/08 0701 - 07/22/2023 0700 In: 273.7 [I.V.:273.7] Out: 1950 [Urine:600; Emesis/NG output:1350] Intake/Output this shift:    PE: Abd: soft, minimal distention, hernia is reduced, few BS, NGT with bilious output, but less yesterday than Saturday.  NT Heart: regular  Lab Results:   Recent Labs  07/08/14 0345 July 21, 2014 0310  WBC 5.0 5.8  HGB 12.9* 12.9*  HCT 38.4* 39.0  PLT 144* 157   BMET  Recent Labs  07/08/14 0345 Jul 21, 2014 0310  NA 147 147  K 3.9 4.0  CL 108 106  CO2 27 27  GLUCOSE 81 80  BUN 31* 30*  CREATININE 0.94 0.89  CALCIUM 8.7 8.7   PT/INR No results for input(s): LABPROT, INR in the last 72 hours. CMP     Component Value Date/Time   NA 147 2014-07-21 0310   NA 143 07/03/2014 1024   K 4.0 07-21-2014 0310   K 3.9 07/03/2014 1024   CL 106 07/21/14 0310   CL 109* 08/01/2012 1046   CO2 27 Jul 21, 2014 0310   CO2 25 07/03/2014 1024   GLUCOSE 80 07/21/2014 0310   GLUCOSE 96 07/03/2014 1024   GLUCOSE 93 08/01/2012 1046   BUN 30* July 21, 2014 0310   BUN 19.4 07/03/2014 1024   CREATININE 0.89 Jul 21, 2014 0310   CREATININE 1.0 07/03/2014 1024   CREATININE 1.00 08/17/2013 1250   CALCIUM 8.7 21-Jul-2014 0310   CALCIUM 9.3 07/03/2014 1024   PROT 6.2 21-Jul-2014 0310   PROT 7.2 07/03/2014 1024   ALBUMIN 2.9* 2014/07/21 0310   ALBUMIN 3.4* 07/03/2014 1024   AST 54* Jul 21, 2014 0310   AST 29 07/03/2014 1024   ALT 40 Jul 21, 2014 0310    ALT 25 07/03/2014 1024   ALKPHOS 114 2014/07/21 0310   ALKPHOS 145 07/03/2014 1024   BILITOT 1.4* Jul 21, 2014 0310   BILITOT 1.33* 07/03/2014 1024   GFRNONAA 84* 07/21/2014 0310   GFRAA >90 07/21/2014 0310   Lipase     Component Value Date/Time   LIPASE 26 07/04/2014 1858       Studies/Results: Dg Abd 2 Views  07-21-2014   CLINICAL DATA:  Abdominal pain and distension with small bowel obstruction demonstrated on earlier studies  EXAM: ABDOMEN - 2 VIEW  COMPARISON:  Abdominal film of July 07, 2014  FINDINGS: There remain loops of moderately distended gas-filled small bowel throughout the abdomen. The volume of gas has decreased slightly. The esophagogastric tube tip and proximal port lie in the region of the gastric cardia. There is some stool and gas in the rectosigmoid. No free extraluminal gas collections are demonstrated.  IMPRESSION: Persistent small bowel obstruction. There may have been slight interval decrease in the volume of gas present within distended small bowel loops.   Electronically Signed   By: David  Martinique   On: 07/21/2014 09:00    Anti-infectives: Anti-infectives    None       Assessment/Plan  1. SBO Patient  Active Problem List   Diagnosis Date Noted  . COPD (chronic obstructive pulmonary disease) 07/06/2014  . Hypothyroidism 07/06/2014  . Umbilical hernia, incarcerated 07/05/2014  . Small bowel obstruction 07/05/2014  . Atrial fibrillation with rapid ventricular response 07/04/2014  . Bowel habit changes 06/01/2014  . Numbness-Left Thigh 12/14/2013  . Visit for wound check 12/14/2013  . Acute pulmonary edema 11/06/2013  . Hypokalemia 11/06/2013  . Diarrhea 11/05/2013  . Atrial fibrillation with RVR 11/02/2013  . AAA (abdominal aortic aneurysm) 10/31/2013  . Abdominal aortic aneurysm 10/20/2013  . Hyperlipidemia 10/09/2013  . Coronary artery disease 09/29/2013  . Hepatic cirrhosis 12/02/2012  . Personal history of adenomatous colonic polyps  07/04/2012  . Hx of radiation therapy   . Abdominal aneurysm without mention of rupture 08/20/2011  . Malignant neoplasm of upper lobe, bronchus or lung 08/06/2011   Plan: 1. I will review his films with Dr. Redmond Pulling, but they do not look much improved given clinically he is still not doing much from below.  He has been here 5 days now.  I suspect he will need an operation today. I have discussed with the patient and his wife who are agreeable.  2. Cardiology would like for him to be put on an amiodarone drip if he is going to the OR due to concern for post op a fib.   3. Will stop heparin  LOS: 5 days    Lytle Malburg E 07/09/2014, 9:17 AM Pager: [phone]  ADDENDUM: Due to heparin just being turned off at 9:30 and unable to start case until 330-4 this afternoon, we will restart his heparin and stop it at 0200am tomorrow morning and plan for surgery tomorrow morning.  D/W wife on the phone and she is agreeable.

## 2014-07-10 NOTE — Anesthesia Postprocedure Evaluation (Signed)
Anesthesia Post Note  Patient: Joseph [name]  Procedure(s) Performed: Procedure(s) (LRB): EXPLORATORY LAPAROTOMY (N/A) LYSIS OF ADHESION - 90 minutes (N/A) SMALL BOWEL RESECTION (N/A) HERNIA REPAIR INCISIONAL (N/A)  Anesthesia type: general  Patient location: PACU  Post pain: Pain level controlled  Post assessment: Patient's Cardiovascular Status Stable  Last Vitals:  Filed Vitals:   07/10/14 1400  BP: 117/71  Pulse: 103  Temp: 36.7 C  Resp: 17    Post vital signs: Reviewed and stable  Level of consciousness: sedated  Complications: No apparent anesthesia complications

## 2014-07-10 NOTE — Plan of Care (Signed)
Problem: Phase II Progression Outcomes Goal: Discharge plan established Outcome: Completed/Met Date Met:  07/10/14

## 2014-07-10 NOTE — Progress Notes (Signed)
Subjective: No significant change.  Anticipating surgery this morning.  +flatus but no BM and continued abdominal distention.  Ambulated briefly yesterday.  No SOB.  Objective: Vital signs in last 24 hours: Temp:  [97.6 F (36.4 C)-98.9 F (37.2 C)] 97.6 F (36.4 C) (11/10 0400) Pulse Rate:  [68-80] 75 (11/10 0400) Resp:  [11-16] 12 (11/10 0400) BP: (110-123)/(59-70) 122/69 mmHg (11/10 0400) SpO2:  [92 %-97 %] 97 % (11/10 0400) Weight change:  Last BM Date: 07/08/14  CBG (last 3)   Recent Labs  08/07/2014 2057 07/10/14 0016 07/10/14 0450  GLUCAP 83 85 92    Intake/Output from previous day: 08-08-23 0701 - 11/10 0700 In: 655.1 [I.V.:655.1] Out: 700 [Urine:200; Emesis/NG output:500] Intake/Output this shift:    General appearance: alert and fatigued Eyes: no scleral icterus Throat: oropharynx moist without erythema Resp: clear to auscultation bilaterally Cardio: regular rate and rhythm TK:WIOXBD distended with hypoactive BS; no abdominal tenderness; hernia reduced currently Extremities: no clubbing, cyanosis or edema   Lab Results:  Recent Labs  2014/08/07 0310 07/10/14 0405  NA 147 149*  K 4.0 3.7  CL 106 109  CO2 27 25  GLUCOSE 80 93  BUN 30* 26*  CREATININE 0.89 0.77  CALCIUM 8.7 8.6    Recent Labs  08/07/2014 0310 07/10/14 0405  AST 54* 62*  ALT 40 57*  ALKPHOS 114 114  BILITOT 1.4* 1.1  PROT 6.2 6.1  ALBUMIN 2.9* 2.8*    Recent Labs  07-Aug-2014 0310 07/10/14 0405  WBC 5.8 5.2  HGB 12.9* 12.5*  HCT 39.0 37.6*  MCV 98.5 98.2  PLT 157 141*    Studies/Results: Dg Abd 2 Views  08-07-2014   CLINICAL DATA:  Abdominal pain and distension with small bowel obstruction demonstrated on earlier studies  EXAM: ABDOMEN - 2 VIEW  COMPARISON:  Abdominal film of July 07, 2014  FINDINGS: There remain loops of moderately distended gas-filled small bowel throughout the abdomen. The volume of gas has decreased slightly. The esophagogastric tube tip and proximal  port lie in the region of the gastric cardia. There is some stool and gas in the rectosigmoid. No free extraluminal gas collections are demonstrated.  IMPRESSION: Persistent small bowel obstruction. There may have been slight interval decrease in the volume of gas present within distended small bowel loops.   Electronically Signed   By: David  Martinique   On: August 07, 2014 09:00     Medications: Scheduled: . antiseptic oral rinse  7 mL Mouth Rinse BID  . bisacodyl  10 mg Rectal Daily  . carvedilol  6.25 mg Oral BID WC  . cefoTEtan (CEFOTAN) IV  2 g Intravenous On Call to OR  . levothyroxine  88 mcg Intravenous Daily  . sodium chloride  10-40 mL Intracatheter Q12H  . tiotropium  18 mcg Inhalation Daily   Continuous: . 0.9 % NaCl with KCl 20 mEq / L 75 mL/hr at 2014-08-07 2119  . amiodarone 30 mg/hr (08/07/14 2119)    Assessment/Plan: Principal Problem: 1. Small bowel obstruction secondary to Umbilical hernia, incarcerated vs. adhesions- no significant improvement clinically or radiographically with conservative measures- anticipate surgery today. Active Problems: 2. Atrial fibrillation with rapid ventricular response- secondary to acute stress related to #1 and volume depletion- remains in Sinus Rhythm with rate improved with hydration and Coreg. Replete K to keep ~4.0 (ordered). Continue Amiodarone perioperatively and resume Heparin when able postoperatively.  Long term anticoagulation per Cardiology. 3. Acute Renal Failure- resolved with hydration.  4. Hepatic cirrhosis- LFTs  improved. No evidence of ascites on CT. 5. Lung Cancer- no recurrence noted on CT 6. Hypothyroidism- Synthroid restarted at 1/2 dose IV- resume 134mcg po when tolerating pos. 7. COPD- stable. Continue Spiriva and use Xopenex as needed (avoid albuterol due to AFib). 8. Protein-Calorie Malnutrition- prolonged NPO status- will resume diet and supplements as bowel function returns postoperatively.  Diet per General  Surgery. 9. Disposition- per general surgery.   Postoperative primary management per General Surgery.  I will continue to follow medical issues which are all stable currently.    LOS: 6 days   Joseph [name] 07/10/2014, 7:10 AM

## 2014-07-10 NOTE — Op Note (Signed)
Joseph [name], Joseph [name] NO.:  [id number]  MEDICAL RECORD NO.:  [id number]  LOCATION:  3S10C                        FACILITY:  [facility]  PHYSICIAN:  [name]. Redmond Pulling, MD, FACSDATE OF BIRTH:  [birth year]  DATE OF PROCEDURE:  07/10/2014 DATE OF DISCHARGE:                              OPERATIVE REPORT   PREOPERATIVE DIAGNOSES: 1. Small bowel obstruction. 2. Incisional hernia.  POSTOPERATIVE DIAGNOSES: 1. Small bowel obstruction. 2. Incisional hernia. 3. Intraabdominal adhesions.  PROCEDURE: 1. Exploratory laparotomy. 2. Lysis of adhesions x90 minutes. 3. Small bowel resection. 4. Open primary repair of incisional hernia.  SURGEON:  [name]. Redmond Pulling, MD, FACS.  ASSISTANT SURGEON:  None.  ANESTHESIA:  General.  ESTIMATED BLOOD LOSS:  100 mL.  FINDINGS:  The patient had dense intra-abdominal adhesions, not only intra-loop adhesions but also adhesions to his anterior abdominal wall mainly around the fascial defect which was around the umbilicus.  In trying to free the adhesions, a defect was made into the mid ileum.  I elected to resect this portion of small bowel and do a side-to-side anastomosis.  Because of the contamination and  he had a small bowel dilatation due to his preceding bowel obstruction, I did not use mesh or biologic mesh.  I was able to bring the fascia back primarily after raising some small skin flaps on each side.  INDICATIONS FOR PROCEDURE:  This is a 73 year old gentleman who has previously undergone an open AAA repair at the beginning of this year. He presented to the hospital last week with nausea, vomiting, and abdominal pain.  He was found to have small-bowel obstruction along with incisional hernia.  It did not appear that the actual hernia was the site of the obstruction that probably just distal to it.  He was initially started on medical management with NG tube decompression. However, despite several days of medical management the  patient did not clinically improve.  He was still having high NG tube output with no sign of bowel function such as bowel movements.  I had a prolonged discussion with the patient and his wife regarding the risks and benefits of surgery including, but not limited to, bleeding, infection, injury to surrounding structures, potential need for bowel resection, anastomotic stricture, anastomotic leakage, and recurrent hernia formation, postoperative ileus, wound infection, cardiac and pulmonary issues.  Cardiology has seen the patient preoperatively and his heparin had been held preoperatively because of the upcoming surgery.  He had been on heparin because of his atrial fibrillation.  They elected to proceed with surgery.  DESCRIPTION OF PROCEDURE:  The patient was taken the OR1 at [facility], placed supine on the operating table.  General endotracheal anesthesia was established.  His abdomen was prepped and draped in usual standard surgical fashion with ChloraPrep.  He received IV antibiotics prior to skin incision.  A surgical time-out was performed.  He had an old midline incision,  pretty much extending from his xiphoid all the way down to his pubis.  I made an elliptical incision, excising the old scar starting near the subxiphoid process extending it down toward just below the umbilicus.  The scar was excised.  I then divided subcutaneous tissue with electrocautery.  I  knew his upper abdominal wall fascia was intact, so that is why I entered the abdomen.  I placed Kocher's on the fascial edge and was able to enter the abdominal cavity.  There is dense omentum adhered to the upper abdomen.  This was lysed with Bovie electrocautery as well as with Metzenbaum scissors.  I then started marching down the midline incision and at this point, there was bowel adhered to the peritoneum.  I was able to free most of it.  But then once I got down around the fascial defect, I encountered a  dense collapsed loop of small bowel around the umbilicus.  Despite staying in the peritoneum and dissecting the peritoneum down, a defect was made in the small bowel.  I was able to close it with a 3-0 silk suture.  I was then able to free the remainder of the bowel from the abdominal wall.  I then took down the lateral abdominal wall attachments on both sides. All in all, it took about 60 minutes to free the omentum and the small bowel from the anterior abdominal wall.  Then, there were numerous interloop adhesions that were around the area & around the dilated bowel.  There was also some obvious areas where internal hernias could form in the future with adhesive bands to between bowel and mesentery which were taken down.  The proximal bowel was chronically dilated and had a thick walled appearance.  I then was able to run the bowel distally and I came across the point of transition, it wasn't an abrupt transition but it was not that a gradual tapering.  Interestingly, at this level there was a loop of distal ileum adhered to where this gradually tapered with a thick adhesive band sort of coming into where the bowel changed in caliber from dilated to normal caliber.  I was able to identify the terminal ileum and the appendix from the cecum that appeared normal.  Some adhesions were taken down from the terminal ileum.  It was the mid portion of the ileum where the defect had been made in the small bowel.  This was in the same loop as to where that dense adhesive band was coming in at a perpendicular angle to where the bowel changed in caliber from dilated to nondilated.  I decided to take this section after I was able to free the and take down the dense adhesive band.  A window was made in this small bowel mesentery distally, and transected with a GIA 75 stapler with a blue load.  I then stapled across the proximal bowel where it was dilated and I fired a GIA 75 blue load stapler.   Unfortunately, the staples did not completely form in one section of the bowel due to the thickness of the wall.  I closed the proximal defect of the staple line with 3-0 silk suture.  I then completed removing the transected section with ligasure to free the mesentery and this freed the specimen.  I elected to transect a little bit more proximally to achieve an intact staple line.  A small rent was made a little bit proximally for about 3 cm, then transected it with a GIA blue load 75 and this freed the area that had the defect in the staple line.  The staple line appeared intact.  I then brought this distal small bowel in a side-to-side fashion to the proximal small bowel and lined them up anti-mesenteric 3-0 silk suture was placed at the  corner of the staple line.  I then excised the corner of the staple line with electrocautery.  Then 1 limb of the GIA-75 stapler was placed through each enterotomy and brought together and fired to create a common channel.  The staple line was intact.  I then closed the common defect with a TA-90 stapler with a green load.  I chose the 90 load because of the thickness of the proximal small bowel side.  I then closed the mesenteric defect in an interrupted fashion using 2-0 silk sutures.  The TA-90 staple line was imbricated with interrupted 3-0 silk sutures.  The mesenteric defect was well closed.  Iron re-ran the small bowel ensuring that there was no injury.  I had previously repaired a serosal tear with 3 interrupted 3-0 silk sutures in a Lembert fashion. I re-ran the bowel again.  There was no evidence of defect.  The abdomen was then irrigated with 4 L of saline.  He did have a fascial defect at his umbilicus.  I excised the hernia sac.  I then raise some skin flaps on each side and brought the fascia back together in a primary fashion using running #1 looped PDS with interrupted #1 Novafil as internal retention sutures.  His skin was left open due  to the contamination.  It was packed with Kerlix, 4 x 4s, and ABDs.  The patient was extubated and taken to PACU in stable condition.  All needle, instrument, sponge counts were correct x2.  There were no immediate complications.  The patient tolerated the procedure well.     [name]. Redmond Pulling, MD, FACS     EMW/MEDQ  D:  07/10/2014  T:  07/10/2014  Job:  [id number]

## 2014-07-10 NOTE — Transfer of Care (Signed)
Immediate Anesthesia Transfer of Care Note  Patient: Joseph [name]  Procedure(s) Performed: Procedure(s): EXPLORATORY LAPAROTOMY (N/A) LYSIS OF ADHESION - 90 minutes (N/A) SMALL BOWEL RESECTION (N/A) HERNIA REPAIR INCISIONAL (N/A)  Patient Location: PACU  Anesthesia Type:General  Level of Consciousness: sedated  Airway & Oxygen Therapy: Patient Spontanous Breathing and Patient connected to face mask oxygen  Post-op Assessment: Report given to PACU RN, Post -op Vital signs reviewed and stable and Patient moving all extremities  Post vital signs: Reviewed and stable  Complications: No apparent anesthesia complications

## 2014-07-10 NOTE — Brief Op Note (Signed)
07/04/2014 - 07/10/2014  12:04 PM  PATIENT:  Joseph [name]  73 y.o. male  PRE-OPERATIVE DIAGNOSIS:  Small bowel obstuction  POST-OPERATIVE DIAGNOSIS:  Small bowel obstuction; adhesions; Incisional hernia  PROCEDURE:  Procedure(s): EXPLORATORY LAPAROTOMY (N/A) LYSIS OF ADHESION - 90 minutes (N/A) SMALL BOWEL RESECTION (N/A) HERNIA REPAIR INCISIONAL (N/A)  SURGEON:  Surgeon(s) and Role:    * [name], MD - Primary  PHYSICIAN ASSISTANT: none  ASSISTANTS: none   ANESTHESIA:   general  EBL:  Total I/O In: 1245 [I.V.:3400; IV Piggyback:250] Out: 1600 [Urine:450; Other:1050; Blood:100]  BLOOD ADMINISTERED:none  DRAINS: Nasogastric Tube and Urinary Catheter (Foley)   LOCAL MEDICATIONS USED:  NONE  SPECIMEN:  Source of Specimen:  distal small bowel  DISPOSITION OF SPECIMEN:  PATHOLOGY  COUNTS:  YES  TOURNIQUET:  * No tourniquets in log *  DICTATION: .Other Dictation: Dictation Number [id number]  PLAN OF CARE: pacu then sdu  PATIENT DISPOSITION:  PACU - hemodynamically stable.   Delay start of Pharmacological VTE agent (>24hrs) due to surgical blood loss or risk of bleeding: no  [name]. Redmond Pulling, MD, FACS General, Bariatric, & Minimally Invasive Surgery [facility], [place]

## 2014-07-10 NOTE — Progress Notes (Signed)
Patient ID: Joseph [name], male   DOB: 1940/11/09, 73 y.o.   MRN: [id number]   Patient Name: Joseph [name] Date of Encounter: 07/10/2014     Principal Problem:   Umbilical hernia, incarcerated Active Problems:   Hepatic cirrhosis   Atrial fibrillation with rapid ventricular response   Small bowel obstruction   COPD (chronic obstructive pulmonary disease)   Hypothyroidism    SUBJECTIVE  No cardiac complaints  Abdomen still distended and tender with NG in place  To OR this am   CURRENT MEDS . [MAR Hold] antiseptic oral rinse  7 mL Mouth Rinse BID  . [MAR Hold] bisacodyl  10 mg Rectal Daily  . [MAR Hold] carvedilol  6.25 mg Oral BID WC  . [MAR Hold] levothyroxine  88 mcg Intravenous Daily  . [MAR Hold] sodium chloride  10-40 mL Intracatheter Q12H  . [MAR Hold] tiotropium  18 mcg Inhalation Daily    OBJECTIVE  Filed Vitals:   07/09/14 2052 07/10/14 0014 07/10/14 0326 07/10/14 0400  BP: 112/61 123/64 118/70 122/69  Pulse: 72 72 68 75  Temp: 98.1 F (36.7 C) 98.2 F (36.8 C)  97.6 F (36.4 C)  TempSrc:      Resp: 12 14 11 12   Height:      Weight:      SpO2: 96% 97% 95% 97%    Intake/Output Summary (Last 24 hours) at 07/10/14 1048 Last data filed at 07/10/14 1006  Gross per 24 hour  Intake 2895.1 ml  Output   1950 ml  Net  945.1 ml   Filed Weights   07/05/14 2347 07/07/14 0400 07/08/14 0400  Weight: 91.3 kg (201 lb 4.5 oz) 93.441 kg (206 lb) 93.078 kg (205 lb 3.2 oz)    PHYSICAL EXAM  General: Pleasant, NAD.  NG tube in  Neuro: Alert and oriented X 3. Moves all extremities spontaneously. Psych: Normal affect. HEENT:  Normal  Neck: Supple without bruits or JVD. Lungs:  Resp regular and unlabored, CTA. Heart: tachycardic no s3, s4, or murmurs. Abdomen: Soft, non-tender, non-distended, BS + x 4.  Extremities: No clubbing, cyanosis or edema. DP/PT/Radials 2+ and equal bilaterally.  Accessory Clinical Findings  CBC  Recent Labs   07/09/14 0310 07/10/14 0405  WBC 5.8 5.2  HGB 12.9* 12.5*  HCT 39.0 37.6*  MCV 98.5 98.2  PLT 157 185*   Basic Metabolic Panel  Recent Labs  07/09/14 0310 07/10/14 0405  NA 147 149*  K 4.0 3.7  CL 106 109  CO2 27 25  GLUCOSE 80 93  BUN 30* 26*  CREATININE 0.89 0.77  CALCIUM 8.7 8.6   Liver Function Tests  Recent Labs  07/09/14 0310 07/10/14 0405  AST 54* 62*  ALT 40 57*  ALKPHOS 114 114  BILITOT 1.4* 1.1  PROT 6.2 6.1  ALBUMIN 2.9* 2.8*    TELE NSR no afib     ECG  NSR with nonspecific ST T wave changes  Echocardiogram  LV EF: 55% -  60%  ------------------------------------------------------------ Indications:   V728.1 Pre-op evaluation. 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 Dyspnea 786.09.  ------------------------------------------------------------ History:  PMH: Emphysema. Acquired from the patient and from the patient's chart. Exertional dyspnea. Known aortic aneurysm. Chronic obstructive pulmonary disease. The patient has a lung malignancy and is status postchemotherapy. Risk factors: Former tobacco use. Dyslipidemia.  ------------------------------------------------------------ Study Conclusions  - Left ventricle: The cavity size was normal. Systolic function was normal. The estimated ejection fraction was in the range of 55% to 60%.  Wall motion was normal; there were no regional wall motion abnormalities. There was an increased relative contribution of atrial contraction to ventricular filling. Doppler parameters are consistent with abnormal left ventricular relaxation (grade 1 diastolic dysfunction). - Aortic valve: Moderate diffuse thickening and calcification, consistent with sclerosis. Trivial regurgitation. - Pulmonary arteries: PA peak pressure: 24mm Hg (S). Impressions:  - The right ventricular systolic pressure was increased consistent with mild pulmonary hypertension.     Radiology/Studies    ASSESSMENT AND PLAN 73 y.o. male w/ PMHx significant for PVD s/p aorta to iliac bypass 10/2013, nonobs CAD by cath remotely, lung CA s/p chemo/rad 2012, HTN, hyperlipidemia and HFpEF present with N/V and found to have small bowel obstruction 2/2 incarceration. He was also noted to have a-fib with RVR  1. A-fib with RVR converted to NSR   - Echo 10/2013 EF 10-27%, grade 1 diastolic dysfunct, moderately calcified aortic valve, PA peak pressure 35 mmHg  - TSH WNL  - CHADS2Vasc score is 3 (age, CHF, PVD) need long term systemic anticoagulation therapy once recover from surgery  - increase coreg   - To OR with iv amiodarone going since yesterday to prevent recurrent afib  2. Ventral hernia with small bowel incarceration  To OR with Dr Redmond Pulling     3. Small bowel obstruction 2/2 #2  4. H/o Lung CA: no recurrence noted on CT  5. Dehydration  - h/o HFpEF, at risk for fluid overload. Monitor fluid status   6. AKI: normalized     Signed, Joseph Rouge PA-C Pager: [phone]

## 2014-07-10 NOTE — Plan of Care (Signed)
Problem: Consults Goal: Diabetes Guidelines if Diabetic/Glucose > 140 If diabetic or lab glucose is > 140 mg/dl - Initiate Diabetes/Hyperglycemia Guidelines & Document Interventions  Outcome: Not Applicable Date Met:  86/75/44  Problem: Phase I Progression Outcomes Goal: Initial discharge plan identified Outcome: Completed/Met Date Met:  07/10/14  Problem: Phase II Progression Outcomes Goal: Vital signs remain stable Outcome: Completed/Met Date Met:  07/10/14

## 2014-07-10 NOTE — Interval H&P Note (Signed)
History and Physical Interval Note:  07/10/2014 7:37 AM  Joseph [name]  has presented today for surgery, with the diagnosis of small bowel obstuction  The various methods of treatment have been discussed with the patient and family. After consideration of risks, benefits and other options for treatment, the patient has consented to  Procedure(s): EXPLORATORY LAPAROTOMY/LYSIS OF ADHESIONS (N/A) repair incisional hernia as a surgical intervention .  The patient's history has been reviewed, patient examined, no change in status, stable for surgery.  I have reviewed the patient's chart and labs.  Questions were answered to the patient's satisfaction.     We discussed the risk and benefits of surgery including but not limited to bleeding, infection, injury to surrounding structures, hernia recurrence, potential mesh complications, hematoma/seroma formation,  blood clot formation, urinary retention, post operative ileus, general anesthesia risk, cardiac issues, PNA, potential need for bowel resection,  long-term abdominal pain.  We discussed the importance of avoiding heavy lifting and straining for a period of 6 weeks.  [name]. Redmond Pulling, MD, [place], Bariatric, & Minimally Invasive Surgery [facility], [place]  [facility]

## 2014-07-11 ENCOUNTER — Encounter (HOSPITAL_COMMUNITY): Payer: Self-pay | Admitting: General Surgery

## 2014-07-11 LAB — CBC
HCT: 44.8 % (ref 39.0–52.0)
Hemoglobin: 15.1 g/dL (ref 13.0–17.0)
MCH: 33.6 pg (ref 26.0–34.0)
MCHC: 33.7 g/dL (ref 30.0–36.0)
MCV: 99.6 fL (ref 78.0–100.0)
Platelets: 134 10*3/uL — ABNORMAL LOW (ref 150–400)
RBC: 4.5 MIL/uL (ref 4.22–5.81)
RDW: 14.4 % (ref 11.5–15.5)
WBC: 9.6 10*3/uL (ref 4.0–10.5)

## 2014-07-11 LAB — BASIC METABOLIC PANEL
ANION GAP: 15 (ref 5–15)
BUN: 20 mg/dL (ref 6–23)
CHLORIDE: 110 meq/L (ref 96–112)
CO2: 23 mEq/L (ref 19–32)
Calcium: 8.2 mg/dL — ABNORMAL LOW (ref 8.4–10.5)
Creatinine, Ser: 0.86 mg/dL (ref 0.50–1.35)
GFR, EST NON AFRICAN AMERICAN: 85 mL/min — AB (ref >90)
Glucose, Bld: 115 mg/dL — ABNORMAL HIGH (ref 70–99)
POTASSIUM: 4.4 meq/L (ref 3.7–5.3)
SODIUM: 148 meq/L — AB (ref 137–147)

## 2014-07-11 LAB — GLUCOSE, CAPILLARY
GLUCOSE-CAPILLARY: 106 mg/dL — AB (ref 70–99)
Glucose-Capillary: 114 mg/dL — ABNORMAL HIGH (ref 70–99)
Glucose-Capillary: 112 mg/dL — ABNORMAL HIGH (ref 70–99)
Glucose-Capillary: 121 mg/dL — ABNORMAL HIGH (ref 70–99)

## 2014-07-11 MED ORDER — POTASSIUM CHLORIDE IN NACL 20-0.9 MEQ/L-% IV SOLN
INTRAVENOUS | Status: DC
  Filled 2014-07-11 (×3): qty 1000

## 2014-07-11 MED ORDER — METOPROLOL TARTRATE 1 MG/ML IV SOLN
2.5000 mg | Freq: Four times a day (QID) | INTRAVENOUS | Status: DC
  Administered 2014-07-11 – 2014-07-12 (×3): 2.5 mg via INTRAVENOUS
  Filled 2014-07-11 (×7): qty 5

## 2014-07-11 MED ORDER — FAT EMULSION 20 % IV EMUL
250.0000 mL | INTRAVENOUS | Status: AC
  Administered 2014-07-11: 250 mL via INTRAVENOUS
  Filled 2014-07-11: qty 250

## 2014-07-11 MED ORDER — INSULIN ASPART 100 UNIT/ML ~~LOC~~ SOLN
0.0000 [IU] | Freq: Four times a day (QID) | SUBCUTANEOUS | Status: DC
  Administered 2014-07-12 – 2014-07-14 (×7): 2 [IU] via SUBCUTANEOUS

## 2014-07-11 MED ORDER — TRACE MINERALS CR-CU-F-FE-I-MN-MO-SE-ZN IV SOLN
INTRAVENOUS | Status: AC
  Administered 2014-07-11: 18:00:00 via INTRAVENOUS
  Filled 2014-07-11: qty 1000

## 2014-07-11 MED ORDER — SODIUM CHLORIDE 0.9 % IJ SOLN
10.0000 mL | INTRAMUSCULAR | Status: DC | PRN
  Administered 2014-07-16 – 2014-07-19 (×5): 10 mL
  Filled 2014-07-11 (×5): qty 40

## 2014-07-11 MED ORDER — SODIUM CHLORIDE 0.9 % IJ SOLN
10.0000 mL | Freq: Two times a day (BID) | INTRAMUSCULAR | Status: DC
  Administered 2014-07-12 – 2014-07-17 (×6): 10 mL
  Administered 2014-07-18: 20 mL
  Administered 2014-07-18: 10 mL

## 2014-07-11 NOTE — Progress Notes (Addendum)
        Patient recovering from GI procedure: PROCEDURE: 1. Exploratory laparotomy. 2. Lysis of adhesions x90 minutes. 3. Small bowel resection. 4. Open primary repair of incisional hernia.  Bowel appeared healthy otherwise.  AAA repair stable.  COLLINS, EMMA MAUREEN PA-C  Overall feels weak NG still in Hopefully resolve ileus soon Agree with nutrition support for now  Ruta Hinds, MD [facility]: [phone] Pager: [phone]

## 2014-07-11 NOTE — Plan of Care (Signed)
Problem: Phase I Progression Outcomes Goal: Incision/dressings dry and intact Outcome: Completed/Met Date Met:  07/11/14 Goal: Tubes/drains patent Outcome: Completed/Met Date Met:  07/11/14

## 2014-07-11 NOTE — Progress Notes (Signed)
PARENTERAL NUTRITION CONSULT NOTE - INITIAL  Pharmacy Consult for TPN Indication: Prolonged ileus  No Known Allergies  Patient Measurements: Height: 6' (182.9 cm) Weight: 205 lb 3.2 oz (93.078 kg) IBW/kg (Calculated) : 77.6 Adjusted Body Weight:  Usual Weight: 93.1 kg  Vital Signs: Temp: 98.2 F (36.8 C) (11/11 1201) Temp Source: Oral (11/11 1201) BP: 107/69 mmHg (11/11 1201) Pulse Rate: 109 (11/11 1201) Intake/Output from previous day: 11/10 0701 - 11/11 0700 In: 4716.9 [I.V.:4366.9; IV Piggyback:350] Out: 2790 [Urine:1490; Emesis/NG output:150; Blood:100] Intake/Output from this shift: Total I/O In: 550.2 [I.V.:550.2] Out: 225 [Urine:175; Emesis/NG output:50]  Labs:  Recent Labs  07/10/14 0405 07/10/14 1250 07/11/14 0247  WBC 5.2 2.4* 9.6  HGB 12.5* 13.4 15.1  HCT 37.6* 40.7 44.8  PLT 141* 138* 134*     Recent Labs  07/09/14 0310 07/10/14 0405 07/10/14 1250 07/11/14 0247  NA 147 149* 147 148*  K 4.0 3.7 3.6* 4.4  CL 106 109 109 110  CO2 27 25 24 23   GLUCOSE 80 93 107* 115*  BUN 30* 26* 22 20  CREATININE 0.89 0.77 0.70 0.86  CALCIUM 8.7 8.6 8.5 8.2*  PROT 6.2 6.1  --   --   ALBUMIN 2.9* 2.8*  --   --   AST 54* 62*  --   --   ALT 40 57*  --   --   ALKPHOS 114 114  --   --   BILITOT 1.4* 1.1  --   --    Estimated Creatinine Clearance: 86.5 mL/min (by C-G formula based on Cr of 0.86).    Recent Labs  07/10/14 0450 07/11/14 0747 07/11/14 1159  GLUCAP 92 106* 114*    Medical History: Past Medical History  Diagnosis Date  . History of colon polyps 2006,2008  . 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 Hyperlipidemia     takes Pravastatin  . Emphysema   . Enlarged prostate     takes Rapaflo daily  . Arthritis     left knee and back arthrtis  . Hypothyroidism     takes Synthroid daily  . Cataract   . Hx of radiation therapy 09/02/11 to 10/19/11    chest  . Diverticulosis   . Cirrhosis     By radiography, seen on CT  . Rosacea   . CAD  (coronary artery disease)   . Shortness of breath   . Heart murmur     teen  . Lung cancer     right upper lobe    Medications:  Prescriptions prior to admission  Medication Sig Dispense Refill Last Dose  . doxycycline (VIBRAMYCIN) 100 MG capsule Take 100 mg by mouth 2 (two) times daily.   Past Week at Unknown time  . furosemide (LASIX) 20 MG tablet Take 1 tablet (20 mg total) by mouth daily. 90 tablet 3 Past Week at Unknown time  . Lactobacillus (DIGESTIVE HEALTH PROBIOTIC) CAPS Take 1 capsule by mouth daily. "30 million cultures"   Past Week at Unknown time  . lidocaine-prilocaine (EMLA) cream Apply 1 application topically as needed.     07/03/2014 at Unknown time  . MILK THISTLE PO Take 1 capsule by mouth daily. Take one 1200 mg capsule daily   Past Week at Unknown time  . Multiple Vitamins-Minerals (MULTIVITAMINS THER. W/MINERALS) TABS Take 1 tablet by mouth daily.     Past Week at Unknown time  . nystatin-triamcinolone (MYCOLOG II) cream Apply 1 application topically daily as needed. Rash  Past Week at Unknown time  . SYNTHROID 175 MCG tablet Take 175 mcg by mouth daily.    07/04/2014 at Unknown time  . tiotropium (SPIRIVA) 18 MCG inhalation capsule Place 18 mcg into inhaler and inhale daily.   07/03/2014 at Unknown time  . HYDROcodone-acetaminophen (NORCO/VICODIN) 5-325 MG per tablet Take 1 tablet by mouth every 6 (six) hours as needed. 30 tablet 0 Taking  . metroNIDAZOLE (FLAGYL) 250 MG tablet Take 1 tablet (250 mg total) by mouth 3 (three) times daily. 30 tablet 0 Taking  . metroNIDAZOLE (METROGEL) 0.75 % gel Apply 1 application topically 2 (two) times daily.   Taking  . OVER THE COUNTER MEDICATION 30 mg 3 (three) times daily. LUNG SUPPORT   Taking  . oxyCODONE-acetaminophen (PERCOCET/ROXICET) 5-325 MG per tablet Take 1 tablet by mouth every 4 (four) hours as needed for severe pain. 30 tablet 0 Taking  . traMADol (ULTRAM) 50 MG tablet Take 50 mg by mouth every 6 (six) hours as needed.    Taking    Insulin Requirements in the past 24 hours:  None. Will start SSI  Current Nutrition:  NPO with ileus  Assessment: N/V x 3 weeks. SBO from chronically incarcerated ventral hernia with h/o previous repair with mesh. 11/10: S/p ex lap with LOA and SBR for SBO. Pharmacy consulted to manage TPN due to not eating 5-6 days prior to surgery, now with ileus.  GI: Patient with ileus after ex lap with LOA and SBR for SBO, Albumin on admission was 3.4. Last BM 11/8.  Endo: Hypothyroid (TSH 0.78), IV Synthroid. LytesEvaristo Bury WNL Renal: Scr 0.86. Pulm: COPD/emphysema. Spiriva Cards: : h/o AAA, HLD, CADNew afib>>NSR. VSS but tachy. IV metoprolol, IV Amio Hepatobil: h/o Cirrhosis. Alkphos 145 on admit but Tbili elevated 1.33. Currently LFT's slightly elevated with Bili down to 1.1. Neuro: Fent PCA for pain ID: Afebrile, WBC 9.6. Cefotetan Heme/Onc: h/o adenomatous colon polyps, lung cancer. Hgb 15.1. Plts 134 Best Practices: Lovenox 40mg /d PTA meds not resumed: Lasix, Lactobacillus, Flagyl gel, MV,  TPN Access: PICC 11/11 TPN day#:0   Nutritional Goals:             kCal,            grams of protein per day  Plan:  Start Clinimix E 5/15 at 49ml/hr and lipids at 56ml/hr tonight. F/u RD goal recommendations Nutrition panel labs in AM Start CBG checks and SSI Decrease IVF to 63ml/hr    Nelva Hauk S. Alford Highland, PharmD, BCPS Clinical Staff Pharmacist Pager [phone]  Eilene Ghazi Stillinger 07/11/2014,1:50 PM

## 2014-07-11 NOTE — Progress Notes (Signed)
INITIAL NUTRITION ASSESSMENT  DOCUMENTATION CODES Per approved criteria  -Not Applicable   INTERVENTION:  TPN dosing per Pharmacy to meet nutrition needs as able.  NUTRITION DIAGNOSIS: Inadequate oral intake related to inability to eat as evidenced by NPO status.   Goal: Intake to meet >90% of estimated nutrition needs.  Monitor:  TPN tolerance/adequacy, weight trend, labs, ability to begin PO diet.  Reason for Assessment: Consult for new TPN  73 y.o. male  Admitting Dx: Umbilical hernia, incarcerated; SBO  ASSESSMENT: 73 YO WM with hx lung Ca, CAD. Presented on 11/4 with several hours of N/V, abd pain, and 3 episodes of non-bloody emesis. In the ER, he was also found to have Afib with RVR.  S/P exploratory laparotomy on 11/10 with LOA and small bowel resection for SBO. Patient has been NPO x 7 days since admission with plans to begin TPN today.   Clinimix E 5/15 at 40 ml/h and Lipids at 10 ml/h will provide 1200 ml, 48 gm protein, and 1162 kcals per day.  Height: Ht Readings from Last 1 Encounters:  07/05/14 6' (1.829 m)    Weight: Wt Readings from Last 1 Encounters:  07/08/14 205 lb 3.2 oz (93.078 kg)    Ideal Body Weight: 80.9 kg  % Ideal Body Weight: 115%  Wt Readings from Last 10 Encounters:  07/08/14 205 lb 3.2 oz (93.078 kg)  06/20/14 208 lb 4.8 oz (94.484 kg)  05/31/14 210 lb (95.255 kg)  02/26/14 207 lb (93.895 kg)  02/01/14 205 lb (92.987 kg)  01/03/14 207 lb 6.4 oz (94.076 kg)  12/14/13 206 lb (93.441 kg)  11/30/13 209 lb (94.802 kg)  11/17/13 223 lb (101.152 kg)  11/07/13 231 lb 0.7 oz (104.8 kg)    Usual Body Weight: 205-210 lb  % Usual Body Weight: 100%  BMI:  Body mass index is 27.82 kg/(m^2).  Estimated Nutritional Needs: Kcal: 2100-2300 Protein: 115-130 gm Fluid: 2.2 L  Skin: abdominal incision  Diet Order: Diet NPO time specified TPN (CLINIMIX-E) Adult  EDUCATION NEEDS: -Education not appropriate at this  time   Intake/Output Summary (Last 24 hours) at 07/11/14 1536 Last data filed at 07/11/14 1300  Gross per 24 hour  Intake 1617.11 ml  Output   1115 ml  Net 502.11 ml    Last BM: 11/8   Labs:   Recent Labs Lab 07/10/14 0405 07/10/14 1250 07/11/14 0247  NA 149* 147 148*  K 3.7 3.6* 4.4  CL 109 109 110  CO2 25 24 23   BUN 26* 22 20  CREATININE 0.77 0.70 0.86  CALCIUM 8.6 8.5 8.2*  GLUCOSE 93 107* 115*    CBG (last 3)   Recent Labs  07/10/14 0450 07/11/14 0747 07/11/14 1159  GLUCAP 92 106* 114*    Scheduled Meds: . antiseptic oral rinse  7 mL Mouth Rinse BID  . cefoTEtan (CEFOTAN) IV  2 g Intravenous Q12H  . enoxaparin (LOVENOX) injection  40 mg Subcutaneous Q24H  . fentaNYL   Intravenous 6 times per day  . insulin aspart  0-15 Units Subcutaneous 4 times per day  . levothyroxine  88 mcg Intravenous Daily  . metoprolol  2.5 mg Intravenous 4 times per day  . sodium chloride  10-40 mL Intracatheter Q12H  . sodium chloride  10-40 mL Intracatheter Q12H  . tiotropium  18 mcg Inhalation Daily    Continuous Infusions: . 0.9 % NaCl with KCl 20 mEq / L    . amiodarone 30 mg/hr (07/11/14 1300)  . Marland Kitchen  TPN (CLINIMIX-E) Adult     And  . fat emulsion      Past Medical History  Diagnosis Date  . History of colon polyps 2006,2008  . 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 Hyperlipidemia     takes Pravastatin  . Emphysema   . Enlarged prostate     takes Rapaflo daily  . Arthritis     left knee and back arthrtis  . Hypothyroidism     takes Synthroid daily  . Cataract   . Hx of radiation therapy 09/02/11 to 10/19/11    chest  . Diverticulosis   . Cirrhosis     By radiography, seen on CT  . Rosacea   . CAD (coronary artery disease)   . Shortness of breath   . Heart murmur     teen  . Lung cancer     right upper lobe    Past Surgical History  Procedure Laterality Date  . Hernia repair  70's  . Vasectomy  70's  . Finger surgery  2008    left pointer finger  .  Knee arthroscopy  2008    left knee  . Tonsillectomy      as a child  . Colonoscopy w/ biopsies      multiple   . Cardiac catheterization  2003  . Esophagogastroduodenoscopy  2006  . Portacath placement  08/10/2011    Procedure: INSERTION PORT-A-CATH;  Surgeon: Pierre Bali, MD;  Location: [place];  Service: Thoracic;  Laterality: Left;  Left Subclavian  . Abdominal aortic aneurysm repair N/A 10/31/2013    Procedure: aorto to left external iliac and right external iliac and left internal iliac, reimplantation of inferior mesenteric artery and ligation of left iliac artery aneursym;  Surgeon: Elam Dutch, MD;  Location: [place];  Service: Vascular;  Laterality: N/A;  . Thrombectomy iliac artery Bilateral 10/31/2013    Procedure: THROMBECTOMY ILIAC ARTERY;  Surgeon: Elam Dutch, MD;  Location: [place];  Service: Vascular;  Laterality: Bilateral;  . Abdominal wound dehiscence N/A 11/10/2013    Procedure: ABDOMINAL WOUND CLOSURE ;  Surgeon: Serafina Mitchell, MD;  Location: [place];  Service: Vascular;  Laterality: N/A;  . Laparotomy N/A 07/10/2014    Procedure: EXPLORATORY LAPAROTOMY;  Surgeon: [name], MD;  Location: [place];  Service: General;  Laterality: N/A;  . Lysis of adhesion N/A 07/10/2014    Procedure: LYSIS OF ADHESION - 90 minutes;  Surgeon: [name], MD;  Location: [place];  Service: General;  Laterality: N/A;  . Bowel resection N/A 07/10/2014    Procedure: SMALL BOWEL RESECTION;  Surgeon: [name], MD;  Location: [place];  Service: General;  Laterality: N/A;  . Incisional hernia repair N/A 07/10/2014    Procedure: HERNIA REPAIR INCISIONAL;  Surgeon: [name], MD;  Location: [place];  Service: General;  Laterality: N/A;    Molli Barrows, [place], [place], [place] Pager [phone] After Hours Pager [phone]

## 2014-07-11 NOTE — Progress Notes (Signed)
    Subjective:  No cardiac complaints  Objective:  Vital Signs in the last 24 hours: Temp:  [97.5 F (36.4 C)-98.2 F (36.8 C)] 97.9 F (36.6 C) (11/11 0747) Pulse Rate:  [96-106] 101 (11/11 0747) Resp:  [15-27] 22 (11/11 0747) BP: (99-132)/(56-79) 116/56 mmHg (11/11 0747) SpO2:  [91 %-96 %] 93 % (11/11 0747)  Intake/Output from previous day:  Intake/Output Summary (Last 24 hours) at 07/11/14 0931 Last data filed at 07/11/14 0600  Gross per 24 hour  Intake 3466.91 ml  Output   2490 ml  Net 976.91 ml    Physical Exam: General appearance: alert, cooperative and no distress Lungs: decreased breath sounds Heart: regular rate and rhythm  Post abdominal surgery Plus 2PT Neuro intact non focal Toupe    Rate: 98  Rhythm: normal sinus rhythm and sinus tachycardia  Lab Results:  Recent Labs  07/10/14 1250 07/11/14 0247  WBC 2.4* 9.6  HGB 13.4 15.1  PLT 138* 134*    Recent Labs  07/10/14 1250 07/11/14 0247  NA 147 148*  K 3.6* 4.4  CL 109 110  CO2 24 23  GLUCOSE 107* 115*  BUN 22 20  CREATININE 0.70 0.86   No results for input(s): TROPONINI in the last 72 hours.  Invalid input(s): CK, MB No results for input(s): INR in the last 72 hours.  Imaging: Imaging results have been reviewed  Cardiac Studies: Echo Feb 2015 Study Conclusions  - Left ventricle: The cavity size was normal. Systolic function was normal. The estimated ejection fraction was in the range of 55% to 60%. Wall motion was normal; there were no regional wall motion abnormalities. There was an increased relative contribution of atrial contraction to ventricular filling. Doppler parameters are consistent with abnormal left ventricular relaxation (grade 1 diastolic dysfunction). - Aortic valve: Moderate diffuse thickening and calcification, consistent with sclerosis. Trivial regurgitation. - Pulmonary arteries: PA peak pressure: 55mm Hg (S). Impressions:  - The  right ventricular systolic pressure was increased consistent with mild pulmonary hypertension.  Marland Kitchen antiseptic oral rinse  7 mL Mouth Rinse BID  . carvedilol  6.25 mg Oral BID WC  . cefoTEtan (CEFOTAN) IV  2 g Intravenous Q12H  . enoxaparin (LOVENOX) injection  40 mg Subcutaneous Q24H  . fentaNYL   Intravenous 6 times per day  . levothyroxine  88 mcg Intravenous Daily  . sodium chloride  10-40 mL Intracatheter Q12H  . 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 He has converted to NSR. He is on IV Amiodarone and DVT dose Lovenox. See Dr Kyla Balzarine note from 07/10/14- start (?) NOAC when able, he is currently NPO.  He has cirrhosis and LFTs are slightly elevated post op, not sure Coumadin, or Amiodarone will be a long term option.   Kerin Ransom PA-C [facility]per [phone] 07/11/2014, 9:31 AM  Progressing post surgery NSR on amiodarone  Defer decision on anticoagulation for now Lungs clear No murmurs with BS  Jenkins Rouge

## 2014-07-11 NOTE — Progress Notes (Signed)
Subjective: Tolerated surgery well- complains of diffuse abdominal soreness and dry mouth this am.  No shortness of breath, chest pain or rapid heart rate.  Objective: Vital signs in last 24 hours: Temp:  [97.5 F (36.4 C)-98.2 F (36.8 C)] 97.9 F (36.6 C) (11/11 0410) Pulse Rate:  [96-106] 99 (11/11 0410) Resp:  [15-27] 19 (11/11 0421) BP: (99-132)/(63-79) 99/66 mmHg (11/11 0410) SpO2:  [91 %-96 %] 96 % (11/11 0421) Weight change:  Last BM Date: 07/08/14  CBG (last 3)   Recent Labs  02-Aug-2014 2057 07/10/14 0016 07/10/14 0450  GLUCAP 83 85 92    Intake/Output from previous day: 11/10 0701 - 11/11 0700 In: 4716.9 [I.V.:4366.9; IV Piggyback:350] Out: 2465 [Urine:1215; Emesis/NG output:100; Blood:100] Intake/Output this shift:    General appearance: fatigued, in apparent discomfort Eyes: no scleral icterus Throat: oropharynx moist without erythema Resp: clear to auscultation bilaterally Cardio: tachycardic, regular without murmur GI: distended with absent bowel sounds; diffuse tenderness; no rebound; +/- guarding Extremities: no clubbing, cyanosis or edema   Lab Results:  Recent Labs  07/10/14 1250 07/11/14 0247  NA 147 148*  K 3.6* 4.4  CL 109 110  CO2 24 23  GLUCOSE 107* 115*  BUN 22 20  CREATININE 0.70 0.86  CALCIUM 8.5 8.2*    Recent Labs  August 02, 2014 0310 07/10/14 0405  AST 54* 62*  ALT 40 57*  ALKPHOS 114 114  BILITOT 1.4* 1.1  PROT 6.2 6.1  ALBUMIN 2.9* 2.8*    Recent Labs  07/10/14 1250 07/11/14 0247  WBC 2.4* 9.6  HGB 13.4 15.1  HCT 40.7 44.8  MCV 101.5* 99.6  PLT 138* 134*   Lab Results  Component Value Date   INR 1.29 11/01/2013   INR 1.37 11/01/2013   INR 1.32 10/31/2013     Studies/Results: Dg Abd 2 Views  08-02-14   CLINICAL DATA:  Abdominal pain and distension with small bowel obstruction demonstrated on earlier studies  EXAM: ABDOMEN - 2 VIEW  COMPARISON:  Abdominal film of July 07, 2014  FINDINGS: There remain  loops of moderately distended gas-filled small bowel throughout the abdomen. The volume of gas has decreased slightly. The esophagogastric tube tip and proximal port lie in the region of the gastric cardia. There is some stool and gas in the rectosigmoid. No free extraluminal gas collections are demonstrated.  IMPRESSION: Persistent small bowel obstruction. There may have been slight interval decrease in the volume of gas present within distended small bowel loops.   Electronically Signed   By: David  Martinique   On: 08/02/2014 09:00     Medications: Scheduled: . antiseptic oral rinse  7 mL Mouth Rinse BID  . carvedilol  6.25 mg Oral BID WC  . cefoTEtan (CEFOTAN) IV  2 g Intravenous Q12H  . enoxaparin (LOVENOX) injection  40 mg Subcutaneous Q24H  . fentaNYL   Intravenous 6 times per day  . levothyroxine  88 mcg Intravenous Daily  . sodium chloride  10-40 mL Intracatheter Q12H  . tiotropium  18 mcg Inhalation Daily   Continuous: . 0.9 % NaCl with KCl 20 mEq / L 75 mL/hr at 07/11/14 0700  . amiodarone 30 mg/hr (07/11/14 0650)    Assessment/Plan: Principal Problem: 1. Small bowel obstruction secondary to adhesions and incisional hernia and adhesions- s/p Ex Lap with adhesiolysis, small bowel resection and open repair of incisional hernia (POD #1)- post-op management per [facility].  Request attending change to reflect General Surgery as primary team.  Active Problems: 2. Atrial  fibrillation with rapid ventricular response- no recurrent Afib since admission- remains on Amiodarone perioperatively.  Resume Heparin drip when OK per [facility].  Currently on Lovenox for DVT prophylaxis. 3. Acute Renal Failure- stable with hydration.  Mild hypernatremia stable.  4. Hepatic cirrhosis- LFTs stable. No evidence of ascites on CT. 5. Lung Cancer- no recurrence noted on CT 6. Hypothyroidism- Synthroid restarted at 1/2 dose IV- resume 139mcg po when tolerating pos. 7. COPD- stable. Continue Spiriva and use Xopenex as  needed (avoid albuterol due to AFib). 8. Protein-Calorie Malnutrition- prolonged NPO status- Diet per General Surgery.  Will add Ensure when tolerating diet. 9. Disposition- per general surgery.    LOS: 7 days   Joseph [name] 07/11/2014, 7:37 AM

## 2014-07-11 NOTE — Progress Notes (Signed)
Patient ID: Joseph [name], male   DOB: 01-Sep-1940, 73 y.o.   MRN: [id number] 1 Day Post-Op  Subjective: Pt c/o pain this am, but otherwise ok.  No flatus  Objective: Vital signs in last 24 hours: Temp:  [97.5 F (36.4 C)-98.2 F (36.8 C)] 97.9 F (36.6 C) (11/11 0747) Pulse Rate:  [96-106] 101 (11/11 0747) Resp:  [15-27] 22 (11/11 0747) BP: (99-132)/(56-79) 116/56 mmHg (11/11 0747) SpO2:  [91 %-96 %] 93 % (11/11 0747) Last BM Date: 07/08/14  Intake/Output from previous day: 11/10 0701 - 11/11 0700 In: 4716.9 [I.V.:4366.9; IV Piggyback:350] Out: 2790 [Urine:1490; Emesis/NG output:150; Blood:100] Intake/Output this shift:    PE: Abd: soft, appropriately tender, wound is clean and packed, fascia intact, absent BS, NGT with some bilious output, mild blood tinged Heart: sinus tachy Lungs: CTAB  Lab Results:   Recent Labs  07/10/14 1250 07/11/14 0247  WBC 2.4* 9.6  HGB 13.4 15.1  HCT 40.7 44.8  PLT 138* 134*   BMET  Recent Labs  07/10/14 1250 07/11/14 0247  NA 147 148*  K 3.6* 4.4  CL 109 110  CO2 24 23  GLUCOSE 107* 115*  BUN 22 20  CREATININE 0.70 0.86  CALCIUM 8.5 8.2*   PT/INR No results for input(s): LABPROT, INR in the last 72 hours. CMP     Component Value Date/Time   NA 148* 07/11/2014 0247   NA 143 07/03/2014 1024   K 4.4 07/11/2014 0247   K 3.9 07/03/2014 1024   CL 110 07/11/2014 0247   CL 109* 08/01/2012 1046   CO2 23 07/11/2014 0247   CO2 25 07/03/2014 1024   GLUCOSE 115* 07/11/2014 0247   GLUCOSE 96 07/03/2014 1024   GLUCOSE 93 08/01/2012 1046   BUN 20 07/11/2014 0247   BUN 19.4 07/03/2014 1024   CREATININE 0.86 07/11/2014 0247   CREATININE 1.0 07/03/2014 1024   CREATININE 1.00 08/17/2013 1250   CALCIUM 8.2* 07/11/2014 0247   CALCIUM 9.3 07/03/2014 1024   PROT 6.1 07/10/2014 0405   PROT 7.2 07/03/2014 1024   ALBUMIN 2.8* 07/10/2014 0405   ALBUMIN 3.4* 07/03/2014 1024   AST 62* 07/10/2014 0405   AST 29 07/03/2014 1024   ALT 57* 07/10/2014 0405   ALT 25 07/03/2014 1024   ALKPHOS 114 07/10/2014 0405   ALKPHOS 145 07/03/2014 1024   BILITOT 1.1 07/10/2014 0405   BILITOT 1.33* 07/03/2014 1024   GFRNONAA 85* 07/11/2014 0247   GFRAA >90 07/11/2014 0247   Lipase     Component Value Date/Time   LIPASE 26 07/04/2014 1858       Studies/Results: No results found.  Anti-infectives: Anti-infectives    Start     Dose/Rate Route Frequency Ordered Stop   07/10/14 1500  cefoTEtan (CEFOTAN) 2 g in dextrose 5 % 50 mL IVPB     2 g100 mL/hr over 30 Minutes Intravenous Every 12 hours 07/10/14 1445 07/12/14 0959   07/10/14 0600  cefoTEtan (CEFOTAN) 1 g in dextrose 5 % 50 mL IVPB  Status:  Discontinued     1 g100 mL/hr over 30 Minutes Intravenous On call to O.R. 07/09/14 1013 07/09/14 1539   07/10/14 0600  [MAR Hold]  cefoTEtan (CEFOTAN) 2 g in dextrose 5 % 50 mL IVPB     (MAR Hold since 07/10/14 0718)   2 g100 mL/hr over 30 Minutes Intravenous On call to O.R. 07/09/14 1539 07/10/14 0825   07/09/14 0930  cefoTEtan (CEFOTAN) 2 g in dextrose 5 % 50  mL IVPB  Status:  Discontinued     2 g100 mL/hr over 30 Minutes Intravenous On call to O.R. 07/09/14 0923 07/09/14 1013       Assessment/Plan  1. POD 1, s/p ex lap with LOA and SBR for SBO 2. A fib, NSR now 3. Post op ileus Patient Active Problem List   Diagnosis Date Noted  . COPD (chronic obstructive pulmonary disease) 07/06/2014  . Hypothyroidism 07/06/2014  . Umbilical hernia, incarcerated 07/05/2014  . Small bowel obstruction 07/05/2014  . Atrial fibrillation with rapid ventricular response 07/04/2014  . Bowel habit changes 06/01/2014  . Numbness-Left Thigh 12/14/2013  . Visit for wound check 12/14/2013  . Acute pulmonary edema 11/06/2013  . Hypokalemia 11/06/2013  . Diarrhea 11/05/2013  . Atrial fibrillation with RVR 11/02/2013  . AAA (abdominal aortic aneurysm) 10/31/2013  . Abdominal aortic aneurysm 10/20/2013  . Hyperlipidemia 10/09/2013  . Coronary  artery disease 09/29/2013  . Hepatic cirrhosis 12/02/2012  . Personal history of adenomatous colonic polyps 07/04/2012  . Hx of radiation therapy   . Abdominal aneurysm without mention of rupture 08/20/2011  . Malignant neoplasm of upper lobe, bronchus or lung 08/06/2011   Plan: 1. Patient appropriate for POD 1 2. Cont foley today for UOP monitoring, hopefully dc tomorrow 3. OOB, will have PT/OT see him 4. Drips per cards, convert coreg to IV as he will not absorb it orally given his ileus 5. Will D/W Dr. Redmond Pulling about PICC/TNA given 5-6 days prior to surgery not eating and likely 5+ days post op he won't be eating 6. BID dressing changes to abdominal wound    LOS: 7 days    Elad Macphail E 07/11/2014, 9:01 AM Pager: [phone]

## 2014-07-11 NOTE — Progress Notes (Signed)
Peripherally Inserted Central Catheter/Midline Placement  The IV Nurse has discussed with the patient and/or persons authorized to consent for the patient, the purpose of this procedure and the potential benefits and risks involved with this procedure.  The benefits include less needle sticks, lab draws from the catheter and patient may be discharged home with the catheter.  Risks include, but not limited to, infection, bleeding, blood clot (thrombus formation), and puncture of an artery; nerve damage and irregular heat beat.  Alternatives to this procedure were also discussed.  PICC/Midline Placement Documentation        Darlyn Read 07/11/2014, 1:46 PM

## 2014-07-12 LAB — COMPREHENSIVE METABOLIC PANEL
ALT: 34 U/L (ref 0–53)
ANION GAP: 11 (ref 5–15)
AST: 26 U/L (ref 0–37)
Albumin: 2.1 g/dL — ABNORMAL LOW (ref 3.5–5.2)
Alkaline Phosphatase: 81 U/L (ref 39–117)
BILIRUBIN TOTAL: 1.1 mg/dL (ref 0.3–1.2)
BUN: 21 mg/dL (ref 6–23)
CALCIUM: 8.2 mg/dL — AB (ref 8.4–10.5)
CHLORIDE: 111 meq/L (ref 96–112)
CO2: 26 mEq/L (ref 19–32)
CREATININE: 0.9 mg/dL (ref 0.50–1.35)
GFR calc Af Amer: >90 mL/min (ref >90)
GFR calc non Af Amer: 84 mL/min — ABNORMAL LOW (ref >90)
Glucose, Bld: 138 mg/dL — ABNORMAL HIGH (ref 70–99)
Potassium: 3.6 mEq/L — ABNORMAL LOW (ref 3.7–5.3)
Sodium: 148 mEq/L — ABNORMAL HIGH (ref 137–147)
Total Protein: 5.5 g/dL — ABNORMAL LOW (ref 6.0–8.3)

## 2014-07-12 LAB — GLUCOSE, CAPILLARY
GLUCOSE-CAPILLARY: 104 mg/dL — AB (ref 70–99)
Glucose-Capillary: 121 mg/dL — ABNORMAL HIGH (ref 70–99)
Glucose-Capillary: 124 mg/dL — ABNORMAL HIGH (ref 70–99)
Glucose-Capillary: 129 mg/dL — ABNORMAL HIGH (ref 70–99)

## 2014-07-12 LAB — CBC
HCT: 38.2 % — ABNORMAL LOW (ref 39.0–52.0)
HEMOGLOBIN: 12.8 g/dL — AB (ref 13.0–17.0)
MCH: 33 pg (ref 26.0–34.0)
MCHC: 33.5 g/dL (ref 30.0–36.0)
MCV: 98.5 fL (ref 78.0–100.0)
Platelets: 134 10*3/uL — ABNORMAL LOW (ref 150–400)
RBC: 3.88 MIL/uL — ABNORMAL LOW (ref 4.22–5.81)
RDW: 14.7 % (ref 11.5–15.5)
WBC: 10.5 10*3/uL (ref 4.0–10.5)

## 2014-07-12 LAB — PHOSPHORUS: PHOSPHORUS: 1.4 mg/dL — AB (ref 2.3–4.6)

## 2014-07-12 LAB — MAGNESIUM: MAGNESIUM: 1.8 mg/dL (ref 1.5–2.5)

## 2014-07-12 MED ORDER — FAT EMULSION 20 % IV EMUL
250.0000 mL | INTRAVENOUS | Status: AC
  Administered 2014-07-12: 250 mL via INTRAVENOUS
  Filled 2014-07-12: qty 250

## 2014-07-12 MED ORDER — KCL IN DEXTROSE-NACL 10-5-0.45 MEQ/L-%-% IV SOLN
INTRAVENOUS | Status: AC
Start: 2014-07-12 — End: 2014-07-13
  Administered 2014-07-12: 12:00:00 via INTRAVENOUS
  Filled 2014-07-12 (×2): qty 1000

## 2014-07-12 MED ORDER — METOPROLOL TARTRATE 1 MG/ML IV SOLN
2.5000 mg | Freq: Four times a day (QID) | INTRAVENOUS | Status: DC
  Administered 2014-07-12 – 2014-07-16 (×17): 2.5 mg via INTRAVENOUS
  Filled 2014-07-12 (×17): qty 5

## 2014-07-12 MED ORDER — TRACE MINERALS CR-CU-F-FE-I-MN-MO-SE-ZN IV SOLN
INTRAVENOUS | Status: AC
  Administered 2014-07-12: 18:00:00 via INTRAVENOUS
  Filled 2014-07-12: qty 1000

## 2014-07-12 MED ORDER — POTASSIUM PHOSPHATES 15 MMOLE/5ML IV SOLN
30.0000 mmol | Freq: Once | INTRAVENOUS | Status: AC
  Administered 2014-07-12: 30 mmol via INTRAVENOUS
  Filled 2014-07-12: qty 10

## 2014-07-12 MED ORDER — MAGNESIUM SULFATE 2 GM/50ML IV SOLN
2.0000 g | Freq: Once | INTRAVENOUS | Status: AC
  Administered 2014-07-12: 2 g via INTRAVENOUS
  Filled 2014-07-12: qty 50

## 2014-07-12 NOTE — Evaluation (Signed)
Physical Therapy Evaluation Patient Details Name: Joseph [name] MRN: [id number] DOB: 1941/07/14 Today's Date: 07/12/2014   History of Present Illness  Adm 07/04/14 with abd pain; incarcerated umbilical hernia with exploratory laparotomy, small bowel resection, hernia repair 07/10/14. PMHx-lunc Ca (radiation tx), hepatic cirrhosis, AAA repair, afib, COPD  Clinical Impression  Patient is s/p abd surgery resulting in functional limitations due to the deficits listed below (see PT Problem List). Pt was "still recovering" from surgery in March per wife, however was not using a device for ambulation. Patient will benefit from skilled PT to increase their independence and safety with mobility to allow discharge to the venue listed below.       Follow Up Recommendations Home health PT;Supervision/Assistance - 24 hour (if slow progress may need post-acute therapy)    Equipment Recommendations  Rolling walker with 5" wheels;3in1 (PT)    Recommendations for Other Services OT consult     Precautions / Restrictions Precautions Precautions: Fall Precaution Comments: multiple lines/tubes      Mobility  Bed Mobility Overal bed mobility: Needs Assistance;+ 2 for safety/equipment Bed Mobility: Rolling;Sidelying to Sit;Sit to Sidelying Rolling: Supervision (with rail) Sidelying to sit: Min assist;+2 for safety/equipment;HOB elevated (HOB 30)     Sit to sidelying: Min assist;+2 for safety/equipment General bed mobility comments: vc for technique, assist to raise trunk/legs due to weakness and pain  Transfers Overall transfer level: Needs assistance Equipment used:  (w/c) Transfers: Sit to/from Stand Sit to Stand: Min assist;+2 safety/equipment         General transfer comment: vc for sequence; steady assist  Ambulation/Gait Ambulation/Gait assistance: Min assist;+2 safety/equipment Ambulation Distance (Feet): 80 Feet Assistive device:  (w/c) Gait Pattern/deviations:  Step-through pattern;Decreased stride length;Trunk flexed     General Gait Details: stooped posture; assist for balance (especially when turning)  Stairs            Wheelchair Mobility    Modified Rankin (Stroke Patients Only)       Balance                                             Pertinent Vitals/Pain SaO2 98% at rest on 5L Ambulated on 6L with SaO2 waveform distorted/not reading SaO2 93% on 6L after seated rest x 1 minute (when pulse ox began to register) HR 90-104  Pain Assessment: 0-10 Pain Score: 8  (aftter ambulation) Pain Location: abd Pain Intervention(s): Limited activity within patient's tolerance;Monitored during session;Premedicated before session;Repositioned;PCA encouraged    Home Living Family/patient expects to be discharged to:: Private residence Living Arrangements: Spouse/significant other Available Help at Discharge: Family Type of Home: House Home Access: Level entry     Home Layout: One level Home Equipment: Cane - single point;Shower seat      Prior Function Level of Independence: Needs assistance   Gait / Transfers Assistance Needed: Independent  ADL's / Homemaking Assistance Needed: wife assisted in/out of shower (since 3/15 AAA repair)        Hand Dominance        Extremity/Trunk Assessment   Upper Extremity Assessment: Generalized weakness           Lower Extremity Assessment: Generalized weakness      Cervical / Trunk Assessment: Kyphotic  Communication   Communication: No difficulties  Cognition Arousal/Alertness: Awake/alert Behavior During Therapy: WFL for tasks assessed/performed Overall Cognitive Status: Within Functional Limits for tasks  assessed                      General Comments General comments (skin integrity, edema, etc.): wife present throughout session and confirming information    Exercises        Assessment/Plan    PT Assessment Patient needs continued  PT services  PT Diagnosis Generalized weakness;Difficulty walking   PT Problem List Decreased strength;Decreased activity tolerance;Decreased balance;Decreased mobility;Decreased knowledge of use of DME;Decreased knowledge of precautions;Cardiopulmonary status limiting activity;Pain  PT Treatment Interventions DME instruction;Gait training;Functional mobility training;Therapeutic activities;Therapeutic exercise;Balance training;Patient/family education   PT Goals (Current goals can be found in the Care Plan section) Acute Rehab PT Goals Patient Stated Goal: to not need an assistive device PT Goal Formulation: With patient/family Time For Goal Achievement: 07/19/14 Potential to Achieve Goals: Good    Frequency Min 3X/week   Barriers to discharge        Co-evaluation               End of Session Equipment Utilized During Treatment: Gait belt;Oxygen Activity Tolerance: Patient limited by fatigue Patient left: in bed;with call bell/phone within reach;with bed alarm set;with family/visitor present Nurse Communication: Mobility status;Patient requests pain meds (PCA syringe empty)         Time: 9379-0240 PT Time Calculation (min) (ACUTE ONLY): 22 min   Charges:   PT Evaluation $Initial PT Evaluation Tier I: 1 Procedure PT Treatments $Gait Training: 8-22 mins   PT G Codes:          Lakenzie Mcclafferty 2014/08/07, 4:31 PM Pager [phone]

## 2014-07-12 NOTE — Progress Notes (Signed)
Subjective: No new complaints.  Sat in chair for ~2 hours.  Using IS.    Objective: Vital signs in last 24 hours: Temp:  [97.8 F (36.6 C)-98.2 F (36.8 C)] 98.1 F (36.7 C) (11/12 0349) Pulse Rate:  [95-109] 99 (11/12 0349) Resp:  [17-30] 21 (11/12 0349) BP: (94-119)/(56-69) 94/64 mmHg (11/12 0349) SpO2:  [91 %-96 %] 94 % (11/12 0349) Weight change:  Last BM Date: 07/08/14  CBG (last 3)   Recent Labs  07/11/14 2012 07/12/14 0004 07/12/14 0521  GLUCAP 121* 121* 129*    Intake/Output from previous day: 11/11 0701 - 11/12 0700 In: 2038.9 [I.V.:1397.9; NG/GT:60; TPN:581] Out: 900 [Urine:750; Emesis/NG output:150] Intake/Output this shift:    General appearance: fatigued and no distress Eyes: no scleral icterus Throat: oropharynx moist without erythema Resp: clear to auscultation bilaterally Cardio: regular rate and rhythm GI: distended with absent bowel sounds; diffuse tenderness improved Extremities: no clubbing, cyanosis; RUE PICC line, mild edema   Lab Results:  Recent Labs  07/11/14 0247 07/12/14 0412  NA 148* 148*  K 4.4 3.6*  CL 110 111  CO2 23 26  GLUCOSE 115* 138*  BUN 20 21  CREATININE 0.86 0.90  CALCIUM 8.2* 8.2*  MG  --  1.8  PHOS  --  1.4*    Recent Labs  07/10/14 0405 07/12/14 0412  AST 62* 26  ALT 57* 34  ALKPHOS 114 81  BILITOT 1.1 1.1  PROT 6.1 5.5*  ALBUMIN 2.8* 2.1*    Recent Labs  07/11/14 0247 07/12/14 0412  WBC 9.6 10.5  HGB 15.1 12.8*  HCT 44.8 38.2*  MCV 99.6 98.5  PLT 134* 134*   Lab Results  Component Value Date   INR 1.29 11/01/2013   INR 1.37 11/01/2013   INR 1.32 10/31/2013    Studies/Results: No results found.   Medications: Scheduled: . antiseptic oral rinse  7 mL Mouth Rinse BID  . enoxaparin (LOVENOX) injection  40 mg Subcutaneous Q24H  . fentaNYL   Intravenous 6 times per day  . insulin aspart  0-15 Units Subcutaneous 4 times per day  . levothyroxine  88 mcg Intravenous Daily  . metoprolol   2.5 mg Intravenous 4 times per day  . sodium chloride  10-40 mL Intracatheter Q12H  . sodium chloride  10-40 mL Intracatheter Q12H  . tiotropium  18 mcg Inhalation Daily   Continuous: . 0.9 % NaCl with KCl 20 mEq / L 35 mL/hr at 07/11/14 1832  . amiodarone 30 mg/hr (07/12/14 0528)  . Marland KitchenTPN (CLINIMIX-E) Adult 40 mL/hr at 07/12/14 0300   And  . fat emulsion 10 kcal (07/12/14 0300)    Assessment/Plan: Principal Problem: 1. Small bowel obstruction secondary to adhesions and incisional hernia and adhesions- s/p Ex Lap with adhesiolysis, small bowel resection and open repair of incisional hernia (POD #2)- post-op management per GSU.  Active Problems: 2. Atrial fibrillation with rapid ventricular response- no recurrent Afib since admission- remains on Amiodarone perioperatively. Anticoagulation per Cardiology. Currently on Lovenox for DVT prophylaxis. 3. Acute Renal Failure- stable with hydration. 4. Hepatic cirrhosis- monitor LFTs with TNA. No evidence of ascites on CT. 5. Lung Cancer- no recurrence noted on CT 6. Hypothyroidism- Synthroid restarted at 1/2 dose IV- resume 175mcg po when tolerating pos. 7. COPD- stable. Continue Spiriva and use Xopenex as needed (avoid albuterol due to AFib). 8. Protein-Calorie Malnutrition- started on TNA yesterday- Diet per General Surgery. Will add Ensure when tolerating diet. 9. Disposition- per general surgery. Continue PT/OT.  ?  discontinue foley?  **Request attending change to reflect General Surgery as primary team.  I will continue to follow medical issues.   LOS: 8 days   [name] 07/12/2014, 7:40 AM

## 2014-07-12 NOTE — Progress Notes (Signed)
Utilization review completed.  

## 2014-07-12 NOTE — Progress Notes (Signed)
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 Still has not passed gas or have bowel movement.   CURRENT MEDS . antiseptic oral rinse  7 mL Mouth Rinse BID  . enoxaparin (LOVENOX) injection  40 mg Subcutaneous Q24H  . fentaNYL   Intravenous 6 times per day  . insulin aspart  0-15 Units Subcutaneous 4 times per day  . levothyroxine  88 mcg Intravenous Daily  . metoprolol  2.5 mg Intravenous 4 times per day  . potassium phosphate IVPB (mmol)  30 mmol Intravenous Once  . sodium chloride  10-40 mL Intracatheter Q12H  . sodium chloride  10-40 mL Intracatheter Q12H  . tiotropium  18 mcg Inhalation Daily    OBJECTIVE  Filed Vitals:   07/12/14 0349 07/12/14 0744 07/12/14 0750 07/12/14 0810  BP: 94/64 97/61    Pulse: 99 92    Temp: 98.1 F (36.7 C) 98.2 F (36.8 C)    TempSrc: Oral Oral    Resp: 21 24  22   Height:      Weight:      SpO2: 94% 94% 92% 93%    Intake/Output Summary (Last 24 hours) at 07/12/14 1004 Last data filed at 07/12/14 0600  Gross per 24 hour  Intake 1763.83 ml  Output    900 ml  Net 863.83 ml   Filed Weights   07/05/14 2347 07/07/14 0400 07/08/14 0400  Weight: 201 lb 4.5 oz (91.3 kg) 206 lb (93.441 kg) 205 lb 3.2 oz (93.078 kg)    PHYSICAL EXAM  General: Pleasant, NAD. Neuro: Alert and oriented X 3. Moves all extremities spontaneously. Psych: Normal affect. HEENT:  Normal  Neck: Supple without bruits or JVD. Lungs:  Resp regular and unlabored, CTA. Heart: RRR no s3, s4, or murmurs. Abdomen: Soft, non-tender,  non-distended, BS + x 4.  Extremities: No clubbing, cyanosis or edema. DP/PT/Radials 2+ and equal bilaterally.  Accessory Clinical Findings  CBC  Recent Labs  07/11/14 0247 07/12/14 0412  WBC 9.6 10.5  HGB 15.1 12.8*  HCT 44.8 38.2*  MCV 99.6 98.5  PLT 134* 734*   Basic Metabolic Panel  Recent Labs  07/11/14 0247 07/12/14 0412  NA 148* 148*  K 4.4 3.6*  CL 110 111  CO2 23 26  GLUCOSE 115* 138*  BUN 20 21  CREATININE 0.86 0.90  CALCIUM 8.2* 8.2*  MG  --  1.8  PHOS  --  1.4*   Liver Function Tests  Recent Labs  07/10/14 0405 07/12/14 0412  AST 62* 26  ALT 57* 34  ALKPHOS 114 81  BILITOT 1.1 1.1  PROT 6.1 5.5*  ALBUMIN 2.8* 2.1*   TELE NSR with HR high 80s to low 90s through out majority of the days, occasionally go up to 100s    ECG  No new EKG  Echocardiogram 10/12/2013  - Left ventricle: The cavity size was normal. Systolic function was normal. The estimated ejection fraction was in the range of 55% to 60%. Wall motion was normal; there were no regional wall motion abnormalities.  There was an increased relative contribution of atrial contraction to ventricular filling. Doppler parameters are consistent with abnormal left ventricular relaxation (grade 1 diastolic dysfunction). - Aortic valve: Moderate diffuse thickening and calcification, consistent with sclerosis. Trivial regurgitation. - Pulmonary arteries: PA peak pressure: 84mm Hg (S). Impressions:  - The right ventricular systolic pressure was increased consistent with mild pulmonary hypertension.    Radiology/Studies  Ct Chest W Contrast  07/03/2014   CLINICAL DATA:  CHEST CT ORDERED FOR RESTAGING LUNG CANCER BY DR. Julien Nordmann.  CTA OF THE ABDOMEN AND PELVIS ORDERED BY DR. Oneida Alar FOR TRIPLE A REPAIR FOLLOW-UP.  EXAM: CT CHEST WITH CONTRAST  CT ABDOMEN AND PELVIS ANGIOGRAPHY WITH AND WITHOUT CONTRAST  TECHNIQUE: Multidetector CT imaging of the chest was performed during  intravenous contrast administration. Multidetector CT imaging of the abdomen and pelvis was performed following the standard protocol before and during bolus administration of intravenous contrast.  CONTRAST:  12mL OMNIPAQUE IOHEXOL 350 MG/ML SOLN  COMPARISON:  Abdominal CT 09/05/2013.  Chest CT 01/01/2014.  FINDINGS: CT CHEST FINDINGS  THORACIC INLET/BODY WALL:  Porta catheter from the left in stable position.  Atrophic or resected thyroid gland. No supraclavicular or axillary adenopathy.  MEDIASTINUM:  No cardiomegaly or pericardial effusion. Diffuse atherosclerosis, including the coronary arteries. No acute vascular findings.  No suspicious or enlarging mediastinal lymph nodes. Previously measured right juxta diaphragmatic node is smaller at 6 mm short axis. A upper prevascular node is stable at 6 mm short axis.  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 No soft tissue increase or nodular component suggestive of recurrent disease. Stable subpleural nodule along the right minor fissure measuring 9 mm. No new pulmonary nodule.  Panlobular and centrilobular emphysema. No pneumonia, edema, effusion, or pneumothorax.  UPPER ABDOMEN:  See below  OSSEOUS:  No acute fracture.  No suspicious lytic or blastic lesions.  CT ABDOMEN AND PELVIS FINDINGS  BODY WALL: There is an incisional hernia just above the umbilicus which contains small bowel and results in a partial small bowel obstruction.  Fatty right inguinal hernia which is small.  Liver: No focal abnormality.  Biliary: No evidence of biliary obstruction or stone.  Pancreas: Unremarkable.  Spleen: Unremarkable.  Adrenals: Unremarkable.  Kidneys and ureters: No hydronephrosis or stone.  Bladder: Unremarkable.  Reproductive: Enlarged prostate, deforming the bladder base. Morphology is stable from previous.  Bowel: There is dilated proximal small bowel leading to a transition point  within the above described hernia sac. Some fluid is present within the relatively decompressed distal small bowel, and the colon is not emptied, compatible with partial obstruction. No evidence of bowel devascularization/necrosis. Negative appendix. Extensive distal colonic diverticulosis without active inflammation.  Retroperitoneum: No mass or adenopathy.  Peritoneum: No ascites or pneumoperitoneum.  Vascular: Aortic branching is standard. 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  Status post bypass of an abdominal aortic aneurysm to the bilateral external iliacs and left internal iliac arteries. The graft is widely patent. Reimplanted IMA is widely patent. Branches of the left hypogastric artery are patent. The new maximal infrarenal aortic diameter measures 31 mm. The new maximal left common iliac artery diameter is 23 mm. The new maximal right common iliac artery diameter is 19 mm. These diameters include the collapsed aneurysm sac wall. The distal left high epigastric artery measures 15 mm in maximal diameter. No concerning periaortic fat infiltration or gas.  OSSEOUS: No acute abnormalities.  These results will be called to the ordering clinician or representative by the Radiologist Assistant, and communication documented in the PACS or zVision Dashboard.  IMPRESSION: 1. Incisional hernia containing small bowel. There is related partial small bowel obstruction. 2. No evidence of recurrent lung cancer. 3. Status post aortic and iliac aneurysm bypass. New baseline anatomy is described above.   Electronically Signed   By: Jorje Guild M.D.   On: 07/03/2014 14:18   US Abdomen Complete  06/19/2014   CLINICAL DATA:  Upper abdominal pain.  Cirrhosis of the liver.  EXAM: ULTRASOUND ABDOMEN COMPLETE  COMPARISON:  09/05/2013  FINDINGS: Gallbladder: No gallstones or wall thickening visualized. No sonographic Murphy sign noted.  Common bile duct: Diameter: Normal  caliber, 4 mm  Liver: Coarsened echotexture with nodular contours compatible with cirrhosis. No focal abnormality.  IVC: No abnormality visualized.  Pancreas: Visualized portion unremarkable.  Spleen: Size and appearance within normal limits.  Right Kidney: Length: 11.8 cm. Echogenicity within normal limits. No mass or hydronephrosis visualized.  Left Kidney: Length: 12.4 cm. Echogenicity within normal limits. No mass or hydronephrosis visualized.  Abdominal aorta: No aneurysm visualized.  Other findings: None.  IMPRESSION: Changes of cirrhosis. No focal hepatic abnormality. No acute findings.   Electronically Signed   By: Rolm Baptise M.D.   On: 06/19/2014 08:25   Dg Abd 2 Views  07/09/2014   CLINICAL DATA:  Abdominal pain and distension with small bowel obstruction demonstrated on earlier studies  EXAM: ABDOMEN - 2 VIEW  COMPARISON:  Abdominal film of July 07, 2014  FINDINGS: There remain loops of moderately distended gas-filled small bowel throughout the abdomen. The volume of gas has decreased slightly. The esophagogastric tube tip and proximal port lie in the region of the gastric cardia. There is some stool and gas in the rectosigmoid. No free extraluminal gas collections are demonstrated.  IMPRESSION: Persistent small bowel obstruction. There may have been slight interval decrease in the volume of gas present within distended small bowel loops.   Electronically Signed   By: David  Martinique   On: 07/09/2014 09:00   Dg Abd 2 Views  07/07/2014   CLINICAL DATA:  Small bowel obstruction.  EXAM: ABDOMEN - 2 VIEW  COMPARISON:  Abdominal CT from 4 days prior  FINDINGS: Dilated bowel in the central abdomen which has progressed in diameter since 07/03/2014. No definite dilatation of colon. Gastric suction tube terminates in the proximal stomach. No evidence for perforation. No new concerning mass effect.  IMPRESSION: 1. Small bowel obstruction which has progressed since comparison 07/03/2014. 2. Orogastric tube  in good position   Electronically Signed   By: Jorje Guild M.D.   On: 07/07/2014 09:24   Ct Angio Abd/pel W/ And/or W/o  07/03/2014   CLINICAL DATA:  CHEST CT ORDERED FOR RESTAGING LUNG CANCER BY DR. Julien Nordmann.  CTA OF THE ABDOMEN AND PELVIS ORDERED BY DR. Oneida Alar FOR TRIPLE A REPAIR FOLLOW-UP.  EXAM: CT CHEST WITH CONTRAST  CT ABDOMEN AND PELVIS ANGIOGRAPHY WITH AND WITHOUT CONTRAST  TECHNIQUE: Multidetector CT imaging of the chest was performed during intravenous contrast administration. Multidetector CT imaging of the abdomen and pelvis was performed following the standard protocol before and during bolus administration of intravenous contrast.  CONTRAST:  112mL OMNIPAQUE IOHEXOL 350 MG/ML SOLN  COMPARISON:  Abdominal CT 09/05/2013.  Chest CT 01/01/2014.  FINDINGS: CT CHEST FINDINGS  THORACIC INLET/BODY WALL:  Porta catheter from the left in stable position.  Atrophic or  resected thyroid gland. No supraclavicular or axillary adenopathy.  MEDIASTINUM:  No cardiomegaly or pericardial effusion. Diffuse atherosclerosis, including the coronary arteries. No acute vascular findings.  No suspicious or enlarging mediastinal lymph nodes. Previously measured right juxta diaphragmatic node is smaller at 6 mm short axis. A upper prevascular node is stable at 6 mm short axis.  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 No soft tissue increase or nodular component suggestive of recurrent disease. Stable subpleural nodule along the right minor fissure measuring 9 mm. No new pulmonary nodule.  Panlobular and centrilobular emphysema. No pneumonia, edema, effusion, or pneumothorax.  UPPER ABDOMEN:  See below  OSSEOUS:  No acute fracture.  No suspicious lytic or blastic lesions.  CT ABDOMEN AND PELVIS FINDINGS  BODY WALL: There is an incisional hernia just above the umbilicus which contains small bowel and results in a partial small bowel  obstruction.  Fatty right inguinal hernia which is small.  Liver: No focal abnormality.  Biliary: No evidence of biliary obstruction or stone.  Pancreas: Unremarkable.  Spleen: Unremarkable.  Adrenals: Unremarkable.  Kidneys and ureters: No hydronephrosis or stone.  Bladder: Unremarkable.  Reproductive: Enlarged prostate, deforming the bladder base. Morphology is stable from previous.  Bowel: There is dilated proximal small bowel leading to a transition point within the above described hernia sac. Some fluid is present within the relatively decompressed distal small bowel, and the colon is not emptied, compatible with partial obstruction. No evidence of bowel devascularization/necrosis. Negative appendix. Extensive distal colonic diverticulosis without active inflammation.  Retroperitoneum: No mass or adenopathy.  Peritoneum: No ascites or pneumoperitoneum.  Vascular: Aortic branching is standard. 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  Status post bypass of an abdominal aortic aneurysm to the bilateral external iliacs and left internal iliac arteries. The graft is widely patent. Reimplanted IMA is widely patent. Branches of the left hypogastric artery are patent. The new maximal infrarenal aortic diameter measures 31 mm. The new maximal left common iliac artery diameter is 23 mm. The new maximal right common iliac artery diameter is 19 mm. These diameters include the collapsed aneurysm sac wall. The distal left high epigastric artery measures 15 mm in maximal diameter. No concerning periaortic fat infiltration or gas.  OSSEOUS: No acute abnormalities.  These results will be called to the ordering clinician or representative by the Radiologist Assistant, and communication documented in the PACS or zVision Dashboard.  IMPRESSION: 1. Incisional hernia containing small bowel. There is related partial small bowel obstruction. 2. No evidence of recurrent lung cancer. 3.  Status post aortic and iliac aneurysm bypass. New baseline anatomy is described above.   Electronically Signed   By: Jorje Guild M.D.   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 Continue on current therapy, NPO as patient still has not passed gas and no bowel since small bowel resection and ex lap for incarcerate intestine. Will plan to transition metoprolol and amiodarone to PO once able to tolerate clear liquid. Will readdress the issue with systemic anticoagulation once stable per surgery and able to  take PO med. Transfer to floor and D/C foley today  Signed, Woodward Ku Pager: [phone]  Patient examined chart reviewed  Slow progress post abdominal surgery Continue iv meds for PAF until He can take PO  Rhythm stable   Jenkins Rouge

## 2014-07-12 NOTE — Progress Notes (Signed)
  [facility] Progress Note  07/12/2014 8:57 AM 2 Days Post-Op  Subjective:  Having moderate abdominal pain. No flatus.     Filed Vitals:   07/12/14 0810  BP:   Pulse:   Temp:   Resp: 22    Physical Exam: General: resting in bed in NAD Abdomen: soft, diffuse tenderness to palpation, distended. Dressing clean.  Pulmonary: non-labored breathing  CBC    Component Value Date/Time   WBC 10.5 07/12/2014 0412   WBC 7.1 07/03/2014 1024   RBC 3.88* 07/12/2014 0412   RBC 4.69 07/03/2014 1024   HGB 12.8* 07/12/2014 0412   HGB 15.4 07/03/2014 1024   HCT 38.2* 07/12/2014 0412   HCT 46.4 07/03/2014 1024   PLT 134* 07/12/2014 0412   PLT 209 07/03/2014 1024   MCV 98.5 07/12/2014 0412   MCV 98.9* 07/03/2014 1024   MCH 33.0 07/12/2014 0412   MCH 32.8 07/03/2014 1024   MCHC 33.5 07/12/2014 0412   MCHC 33.1 07/03/2014 1024   RDW 14.7 07/12/2014 0412   RDW 14.5 07/03/2014 1024   LYMPHSABS 1.4 07/04/2014 1858   LYMPHSABS 1.8 07/03/2014 1024   MONOABS 0.7 07/04/2014 1858   MONOABS 0.6 07/03/2014 1024   EOSABS 0.0 07/04/2014 1858   EOSABS 0.1 07/03/2014 1024   BASOSABS 0.0 07/04/2014 1858   BASOSABS 0.0 07/03/2014 1024    BMET    Component Value Date/Time   NA 148* 07/12/2014 0412   NA 143 07/03/2014 1024   K 3.6* 07/12/2014 0412   K 3.9 07/03/2014 1024   CL 111 07/12/2014 0412   CL 109* 08/01/2012 1046   CO2 26 07/12/2014 0412   CO2 25 07/03/2014 1024   GLUCOSE 138* 07/12/2014 0412   GLUCOSE 96 07/03/2014 1024   GLUCOSE 93 08/01/2012 1046   BUN 21 07/12/2014 0412   BUN 19.4 07/03/2014 1024   CREATININE 0.90 07/12/2014 0412   CREATININE 1.0 07/03/2014 1024   CREATININE 1.00 08/17/2013 1250   CALCIUM 8.2* 07/12/2014 0412   CALCIUM 9.3 07/03/2014 1024   GFRNONAA 84* 07/12/2014 0412   GFRAA >90 07/12/2014 0412    INR    Component Value Date/Time   INR 1.29 11/01/2013 1706     Intake/Output Summary (Last 24 hours) at 07/12/14 0857 Last  data filed at 07/12/14 0600  Gross per 24 hour  Intake 1947.23 ml  Output    900 ml  Net 1047.23 ml     Assessment:  73 y.o. male with incarcerated umbilical hernia, SBO,  who is s/p: ex lap, small bowel resection, hernia repair.   2 Days Post-Op  Plan: -NGT still in place. No flatus. -Continue nutrition support. -Has follow-up in March for routine CTA s/p open AAA repair   Virgina Jock, PA-C [facility] Office: [phone] Pager: [phone] 07/12/2014 8:57 AM

## 2014-07-12 NOTE — Progress Notes (Signed)
PARENTERAL NUTRITION CONSULT NOTE - Follow-up  Pharmacy Consult for TPN Indication: Prolonged ileus  No Known Allergies  Patient Measurements: Height: 6' (182.9 cm) Weight: 205 lb 3.2 oz (93.078 kg) IBW/kg (Calculated) : 77.6 Usual Weight: 93.1 kg  Vital Signs: Temp: 98.2 F (36.8 C) (11/12 0744) Temp Source: Oral (11/12 0744) BP: 97/61 mmHg (11/12 0744) Pulse Rate: 92 (11/12 0744) Intake/Output from previous day: 11/11 0701 - 11/12 0700 In: 2038.9 [I.V.:1397.9; NG/GT:60; TPN:581] Out: 900 [Urine:750; Emesis/NG output:150] Intake/Output from this shift:    Labs:  Recent Labs  07/10/14 1250 07/11/14 0247 07/12/14 0412  WBC 2.4* 9.6 10.5  HGB 13.4 15.1 12.8*  HCT 40.7 44.8 38.2*  PLT 138* 134* 134*     Recent Labs  07/10/14 0405 07/10/14 1250 07/11/14 0247 07/12/14 0412  NA 149* 147 148* 148*  K 3.7 3.6* 4.4 3.6*  CL 109 109 110 111  CO2 25 24 23 26   GLUCOSE 93 107* 115* 138*  BUN 26* 22 20 21   CREATININE 0.77 0.70 0.86 0.90  CALCIUM 8.6 8.5 8.2* 8.2*  MG  --   --   --  1.8  PHOS  --   --   --  1.4*  PROT 6.1  --   --  5.5*  ALBUMIN 2.8*  --   --  2.1*  AST 62*  --   --  26  ALT 57*  --   --  34  ALKPHOS 114  --   --  81  BILITOT 1.1  --   --  1.1   Estimated Creatinine Clearance: 82.6 mL/min (by C-G formula based on Cr of 0.9).    Recent Labs  07/11/14 2012 07/12/14 0004 07/12/14 0521  GLUCAP 121* 121* 129*    Insulin Requirements in the past 24 hours:  4 units Novolog SSI  Current Nutrition:  Clinimix E 5/15 at 40ml/hr and 20% lipids at 10 ml/hr provides 1162 kcal and 48 gm protein Goal: Clinimix E 5/15 at 158ml/hr and 20% lipids at 71ml/hr will provide 2184 kcal and 120 gm protein  Nutritional Goals:  2100-2300 kCal, 115-130 grams of protein per day (per RD recs 11/11)  Assessment: N/V x 3 weeks.SBO from chronically incarcerated ventral hernia with h/o previous repair with mesh. 11/10: S/p ex lap with LOA and SBR for SBO. Pharmacy  consulted to manage TPN due to not eating 5-6 days prior to surgery, now with ileus.  GI: Patient with ileus after 11/10 ex lap with LOA and SBR for SBO. Remains NPO. NGT in place. No flatus or BM yet.  Endo: Hypothyroid (TSH 0.78), IV Synthroid (1/2 home dose). No h/o DM. Empiric SSI started with TPN initiation. CBGs < 150.  Lytes: K 3.6 (goal > 4 with ileus), Phos 1.4, Mg 1.8 (goal >2 with ileus). MD ordered Kphos 27mmol today. Will also replace Mg. Corr Ca wnl. Na bit high - likely related to volume issues.  Renal: Scr stable. UOP 0.3 ml/kg/hr. NS with 20 mEq KCl 35 ml/hr.  Pulm: COPD/emphysema. 3L [place]. Spiriva  Cards: : h/o AAA, HLD, CAD. New afib. VSS. Pt now in NSR. IV metoprolol, IV Amio  Hepatobil: h/o cirrhosis. LFTs wnl.  Neuro: Fent PCA for pain; pain score 2-5.  ID: Afebrile, WBC wnl. No abx.  Heme/Onc: h/o adenomatous colon polyps, lung cancer. Hgb down a bit to 12.8, Plts 134 (stable)  Best Practices: Lovenox 40mg /d  TPN Access: Double lumen PICC 11/11  TPN day#:1  Plan:  Continue Clinimix  E 5/15 at 78ml/hr and 20% lipids at 7ml/hr. Will advance to goal as tolerated. Will not advance today with low phos. Continue with MVI, trace elements in TPN daily Continue sensitive SSI q6h Magnesium sulfate 2gm IV today Change MIVF to D51/2 NS with 97mEq KCL at 35 ml/hr F/u BMET, Mg, Phos, baseline prealbumin and TG  Sherlon Handing, PharmD, BCPS Clinical pharmacist, pager [phone] 07/12/2014,10:34 AM

## 2014-07-12 NOTE — Progress Notes (Signed)
2 Days Post-Op  Subjective: No flatus. Pain ok. IS up to 1250.   Objective: Vital signs in last 24 hours: Temp:  [97.8 F (36.6 C)-98.2 F (36.8 C)] 98.2 F (36.8 C) (11/12 0744) Pulse Rate:  [92-109] 92 (11/12 0744) Resp:  [17-30] 22 (11/12 0810) BP: (94-119)/(59-69) 97/61 mmHg (11/12 0744) SpO2:  [91 %-96 %] 93 % (11/12 0810) Last BM Date: 07/08/14  Intake/Output from previous day: 11/11 0701 - 11/12 0700 In: 2038.9 [I.V.:1397.9; NG/GT:60; TPN:581] Out: 900 [Urine:750; Emesis/NG output:150] Intake/Output this shift:    Alert, nontoxic cta b/l Rate ok Soft, distended, dressing intact; hypobs +SCDs, no edema  Lab Results:   Recent Labs  07/11/14 0247 07/12/14 0412  WBC 9.6 10.5  HGB 15.1 12.8*  HCT 44.8 38.2*  PLT 134* 134*   BMET  Recent Labs  07/11/14 0247 07/12/14 0412  NA 148* 148*  K 4.4 3.6*  CL 110 111  CO2 23 26  GLUCOSE 115* 138*  BUN 20 21  CREATININE 0.86 0.90  CALCIUM 8.2* 8.2*   PT/INR No results for input(s): LABPROT, INR in the last 72 hours. ABG No results for input(s): PHART, HCO3 in the last 72 hours.  Invalid input(s): PCO2, PO2  Studies/Results: No results found.  Anti-infectives: Anti-infectives    Start     Dose/Rate Route Frequency Ordered Stop   07/10/14 1500  cefoTEtan (CEFOTAN) 2 g in dextrose 5 % 50 mL IVPB     2 g100 mL/hr over 30 Minutes Intravenous Every 12 hours 07/10/14 1445 07/11/14 2245   07/10/14 0600  cefoTEtan (CEFOTAN) 1 g in dextrose 5 % 50 mL IVPB  Status:  Discontinued     1 g100 mL/hr over 30 Minutes Intravenous On call to O.R. 07/09/14 1013 07/09/14 1539   07/10/14 0600  [MAR Hold]  cefoTEtan (CEFOTAN) 2 g in dextrose 5 % 50 mL IVPB     (MAR Hold since 07/10/14 0718)   2 g100 mL/hr over 30 Minutes Intravenous On call to O.R. 07/09/14 1539 07/10/14 0825   07/09/14 0930  cefoTEtan (CEFOTAN) 2 g in dextrose 5 % 50 mL IVPB  Status:  Discontinued     2 g100 mL/hr over 30 Minutes Intravenous On call to  O.R. 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 Redmond Pulling, MD, FACS General, Bariatric, & Minimally Invasive Surgery [facility], [place]   LOS: 8 days    [name] 07/12/2014

## 2014-07-13 LAB — PREALBUMIN: Prealbumin: 6.6 mg/dL — ABNORMAL LOW (ref 17.0–34.0)

## 2014-07-13 LAB — BASIC METABOLIC PANEL
Anion gap: 10 (ref 5–15)
BUN: 16 mg/dL (ref 6–23)
CALCIUM: 7.9 mg/dL — AB (ref 8.4–10.5)
CO2: 26 meq/L (ref 19–32)
Chloride: 112 mEq/L (ref 96–112)
Creatinine, Ser: 0.78 mg/dL (ref 0.50–1.35)
GFR calc Af Amer: >90 mL/min (ref >90)
GFR calc non Af Amer: 89 mL/min — ABNORMAL LOW (ref >90)
Glucose, Bld: 123 mg/dL — ABNORMAL HIGH (ref 70–99)
POTASSIUM: 3.6 meq/L — AB (ref 3.7–5.3)
SODIUM: 148 meq/L — AB (ref 137–147)

## 2014-07-13 LAB — GLUCOSE, CAPILLARY
GLUCOSE-CAPILLARY: 115 mg/dL — AB (ref 70–99)
GLUCOSE-CAPILLARY: 119 mg/dL — AB (ref 70–99)
GLUCOSE-CAPILLARY: 111 mg/dL — AB (ref 70–99)
Glucose-Capillary: 127 mg/dL — ABNORMAL HIGH (ref 70–99)
Glucose-Capillary: 137 mg/dL — ABNORMAL HIGH (ref 70–99)

## 2014-07-13 LAB — TRIGLYCERIDES: TRIGLYCERIDES: 138 mg/dL (ref <150)

## 2014-07-13 LAB — PHOSPHORUS: PHOSPHORUS: 2.7 mg/dL (ref 2.3–4.6)

## 2014-07-13 LAB — MAGNESIUM: Magnesium: 2.1 mg/dL (ref 1.5–2.5)

## 2014-07-13 MED ORDER — TRACE MINERALS CR-CU-F-FE-I-MN-MO-SE-ZN IV SOLN
INTRAVENOUS | Status: AC
  Administered 2014-07-13: 17:00:00 via INTRAVENOUS
  Filled 2014-07-13: qty 2000

## 2014-07-13 MED ORDER — AMIODARONE HCL IN DEXTROSE 360-4.14 MG/200ML-% IV SOLN
INTRAVENOUS | Status: AC
Start: 2014-07-13 — End: 2014-07-13
  Administered 2014-07-13: 200 mL
  Filled 2014-07-13: qty 200

## 2014-07-13 MED ORDER — DIPHENHYDRAMINE HCL 12.5 MG/5ML PO ELIX
12.5000 mg | ORAL_SOLUTION | Freq: Four times a day (QID) | ORAL | Status: DC | PRN
  Filled 2014-07-13: qty 5

## 2014-07-13 MED ORDER — DIPHENHYDRAMINE HCL 50 MG/ML IJ SOLN: 12.5000 mg | Freq: Four times a day (QID) | INTRAMUSCULAR | Status: DC | PRN

## 2014-07-13 MED ORDER — FAT EMULSION 20 % IV EMUL
250.0000 mL | INTRAVENOUS | Status: AC
  Administered 2014-07-13: 250 mL via INTRAVENOUS
  Filled 2014-07-13: qty 250

## 2014-07-13 MED ORDER — FENTANYL 10 MCG/ML IV SOLN
INTRAVENOUS | Status: DC
  Administered 2014-07-13: 13:00:00 via INTRAVENOUS
  Administered 2014-07-13: 6 ug/h via INTRAVENOUS
  Administered 2014-07-14: 5 ug/h via INTRAVENOUS
  Administered 2014-07-14: 60 ug via INTRAVENOUS
  Administered 2014-07-14: 20 ug/h via INTRAVENOUS
  Administered 2014-07-14: 30 ug via INTRAVENOUS
  Administered 2014-07-14: 50 ug via INTRAVENOUS
  Administered 2014-07-15: 14:00:00 via INTRAVENOUS
  Administered 2014-07-15: 6 ug via INTRAVENOUS
  Administered 2014-07-15: 4 mL via INTRAVENOUS
  Administered 2014-07-15: 0 ug/h via INTRAVENOUS
  Administered 2014-07-15: 6 mL via INTRAVENOUS
  Administered 2014-07-16: 11 ug via INTRAVENOUS
  Administered 2014-07-16: 170 ug via INTRAVENOUS
  Administered 2014-07-16: 10 ug via INTRAVENOUS
  Administered 2014-07-16: 11:00:00 via INTRAVENOUS
  Administered 2014-07-16: 4 ug via INTRAVENOUS
  Administered 2014-07-16: 25 ug via INTRAVENOUS
  Administered 2014-07-17: 200 ug via INTRAVENOUS
  Administered 2014-07-17: 02:00:00 via INTRAVENOUS
  Administered 2014-07-17: 13 ug via INTRAVENOUS
  Administered 2014-07-17: 6 ug via INTRAVENOUS
  Filled 2014-07-13 (×4): qty 50

## 2014-07-13 MED ORDER — POTASSIUM CHLORIDE 10 MEQ/50ML IV SOLN
10.0000 meq | INTRAVENOUS | Status: AC
  Administered 2014-07-13 (×4): 10 meq via INTRAVENOUS
  Filled 2014-07-13 (×4): qty 50

## 2014-07-13 MED ORDER — NALOXONE HCL 0.4 MG/ML IJ SOLN: 0.4000 mg | INTRAMUSCULAR | Status: DC | PRN

## 2014-07-13 MED ORDER — BISACODYL 10 MG RE SUPP
10.0000 mg | Freq: Once | RECTAL | Status: AC
  Administered 2014-07-13: 10 mg via RECTAL
  Filled 2014-07-13: qty 1

## 2014-07-13 MED ORDER — KCL IN DEXTROSE-NACL 10-5-0.45 MEQ/L-%-% IV SOLN
INTRAVENOUS | Status: DC
  Filled 2014-07-13 (×5): qty 1000

## 2014-07-13 MED ORDER — SODIUM CHLORIDE 0.9 % IJ SOLN: 9.0000 mL | INTRAMUSCULAR | Status: DC | PRN

## 2014-07-13 MED ORDER — DEXTROSE 5 % IV SOLN
10.0000 mmol | Freq: Once | INTRAVENOUS | Status: AC
Start: 2014-07-13 — End: 2014-07-14
  Administered 2014-07-13: 10 mmol via INTRAVENOUS
  Filled 2014-07-13: qty 3.33

## 2014-07-13 MED ORDER — ACETAMINOPHEN 10 MG/ML IV SOLN
1000.0000 mg | Freq: Four times a day (QID) | INTRAVENOUS | Status: AC
  Administered 2014-07-13 – 2014-07-14 (×3): 1000 mg via INTRAVENOUS
  Filled 2014-07-13 (×6): qty 100

## 2014-07-13 MED ORDER — ONDANSETRON HCL 4 MG/2ML IJ SOLN
4.0000 mg | Freq: Four times a day (QID) | INTRAMUSCULAR | Status: DC | PRN
  Administered 2014-07-14 – 2014-07-16 (×4): 4 mg via INTRAVENOUS
  Filled 2014-07-13 (×4): qty 2

## 2014-07-13 NOTE — Progress Notes (Signed)
3 Days Post-Op  Subjective: Maybe 1 episode of flatus. Didn't sleep well. Wife reports along with pt that he was a little confused yesterday. Pain ok.   Objective: Vital signs in last 24 hours: Temp:  [97.3 F (36.3 C)-98.9 F (37.2 C)] 97.3 F (36.3 C) (11/13 0745) Pulse Rate:  [84-113] 84 (11/13 0745) Resp:  [15-25] 16 (11/13 0800) BP: (96-120)/(59-68) 120/65 mmHg (11/13 0745) SpO2:  [92 %-95 %] 93 % (11/13 0800) Last BM Date: 07/08/14  Intake/Output from previous day: 11/12 0701 - 11/13 0700 In: 2192.1 [I.V.:602.1; IV Piggyback:560; TPN:1030] Out: 575 [Urine:425; Emesis/NG output:150] Intake/Output this shift: Total I/O In: 101.7 [I.V.:51.7; TPN:50] Out: 0   Sitting in chair, alert, ox3; approp cta b/l; IS 1250 Reg Soft, distended; hypoBS. Dressing c/d/i  no edema  Lab Results:   Recent Labs  07/11/14 0247 07/12/14 0412  WBC 9.6 10.5  HGB 15.1 12.8*  HCT 44.8 38.2*  PLT 134* 134*   BMET  Recent Labs  07/12/14 0412 07/13/14 0500  NA 148* 148*  K 3.6* 3.6*  CL 111 112  CO2 26 26  GLUCOSE 138* 123*  BUN 21 16  CREATININE 0.90 0.78  CALCIUM 8.2* 7.9*   PT/INR No results for input(s): LABPROT, INR in the last 72 hours. ABG No results for input(s): PHART, HCO3 in the last 72 hours.  Invalid input(s): PCO2, PO2  Studies/Results: No results found.  Anti-infectives: Anti-infectives    Start     Dose/Rate Route Frequency Ordered Stop   07/10/14 1500  cefoTEtan (CEFOTAN) 2 g in dextrose 5 % 50 mL IVPB     2 g100 mL/hr over 30 Minutes Intravenous Every 12 hours 07/10/14 1445 07/11/14 2245   07/10/14 0600  cefoTEtan (CEFOTAN) 1 g in dextrose 5 % 50 mL IVPB  Status:  Discontinued     1 g100 mL/hr over 30 Minutes Intravenous On call to O.R. 07/09/14 1013 07/09/14 1539   07/10/14 0600  [MAR Hold]  cefoTEtan (CEFOTAN) 2 g in dextrose 5 % 50 mL IVPB     (MAR Hold since 07/10/14 0718)   2 g100 mL/hr over 30 Minutes Intravenous On call to O.R. 07/09/14 1539  07/10/14 0825   07/09/14 0930  cefoTEtan (CEFOTAN) 2 g in dextrose 5 % 50 mL IVPB  Status:  Discontinued     2 g100 mL/hr over 30 Minutes Intravenous On call to O.R. 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 Redmond Pulling, MD, FACS General, Bariatric, & Minimally Invasive Surgery [facility], [place]   LOS: 9 days    [name] 07/13/2014

## 2014-07-13 NOTE — Progress Notes (Signed)
Physical Therapy Treatment Patient Details Name: Joseph [name] MRN: [id number] DOB: December 08, 1940 Today's Date: 07/13/2014    History of Present Illness Adm 07/04/14 with abd pain; incarcerated umbilical hernia with exploratory laparotomy, small bowel resection, hernia repair 07/10/14. PMHx-lunc Ca (radiation tx), hepatic cirrhosis, AAA repair, afib, COPD    PT Comments    Pt very motivated to mobilize and progress therapy. Pt able to increase mobility distance with 2 person (A) for lines and lead management. Required multiple standing rest breaks due to decr activity tolerance. Will cont to follow per POC.   Follow Up Recommendations  Home health PT;Supervision/Assistance - 24 hour     Equipment Recommendations  Rolling walker with 5" wheels;3in1 (PT)    Recommendations for Other Services       Precautions / Restrictions Precautions Precautions: Fall Precaution Comments: multiple lines/tubes Restrictions Weight Bearing Restrictions: No    Mobility  Bed Mobility Overal bed mobility: Needs Assistance Bed Mobility: Supine to Sit     Supine to sit: Min assist     General bed mobility comments: (A) to control trunk to supine position  Transfers Overall transfer level: Needs assistance Equipment used: Rolling walker (2 wheeled) Transfers: Sit to/from Stand Sit to Stand: Min assist;+2 safety/equipment         General transfer comment: cues for hand placement and sequencin;g (A) to power up due to generalized weakness   Ambulation/Gait Ambulation/Gait assistance: +2 safety/equipment;Min assist Ambulation Distance (Feet): 140 Feet Assistive device: Rolling walker (2 wheeled) Gait Pattern/deviations: Step-through pattern;Decreased stride length;Shuffle;Wide base of support Gait velocity: decreased  Gait velocity interpretation: Below normal speed for age/gender General Gait Details: cues for upright posture and gt sequencing; required multiple standing rest  breaks due to fatigue ; 2 person (A) for lines and leads management    Stairs            Wheelchair Mobility    Modified Rankin (Stroke Patients Only)       Balance Overall balance assessment: Needs assistance Sitting-balance support: Feet supported;No upper extremity supported Sitting balance-Leahy Scale: Fair Sitting balance - Comments: sat EOB ~5 min; no c/o dizziness   Standing balance support: During functional activity;Bilateral upper extremity supported Standing balance-Leahy Scale: Poor Standing balance comment: relying on RW to balance                     Cognition Arousal/Alertness: Awake/alert Behavior During Therapy: WFL for tasks assessed/performed Overall Cognitive Status: Within Functional Limits for tasks assessed                      Exercises General Exercises - Lower Extremity Ankle Circles/Pumps: AROM;Both;10 reps;Seated    General Comments General comments (skin integrity, edema, etc.): wife present during session; VSS throughout ; encouraged OOB for all meals to increase activity tolerance       Pertinent Vitals/Pain Pain Assessment: 0-10 Pain Score: 3  Faces Pain Scale: Hurts even more Pain Location: abdomen Pain Descriptors / Indicators: Discomfort;Operative site guarding Pain Intervention(s): Monitored during session;Premedicated before session;Repositioned    Home Living Family/patient expects to be discharged to:: Private residence Living Arrangements: Spouse/significant other Available Help at Discharge: Family;Available 24 hours/day Type of Home: House Home Access: Level entry   Home Layout: One level Home Equipment: Cane - single point;Shower seat      Prior Function Level of Independence: Needs assistance  Gait / Transfers Assistance Needed: Independent ADL's / Homemaking Assistance Needed: wife assisted in/out of shower (since 3/15  AAA repair) Comments: pt had slowed down considerably since his AAA   PT  Goals (current goals can now be found in the care plan section) Acute Rehab PT Goals Patient Stated Goal: to get moving then rest PT Goal Formulation: With patient/family Time For Goal Achievement: 07/19/14 Potential to Achieve Goals: Good Progress towards PT goals: Progressing toward goals    Frequency  Min 3X/week    PT Plan Current plan remains appropriate    Co-evaluation             End of Session Equipment Utilized During Treatment: Gait belt;Oxygen Activity Tolerance: Patient limited by fatigue Patient left: in bed;with call bell/phone within reach;with family/visitor present     Time: 1020-1046 PT Time Calculation (min) (ACUTE ONLY): 26 min  Charges:  [employer] Training: 23-37 mins                    G CodesGustavus [name] , [place]  07/13/2014, 1:13 PM

## 2014-07-13 NOTE — Progress Notes (Signed)
        Patient had a rough night didn't sleep well.  He is resting well this am. He was up out of bed yesterday and walked a few steps.  Starnisha Batrez MAUREEN PA-C

## 2014-07-13 NOTE — Progress Notes (Signed)
Subjective: Some discomfort overnight.  Mild cough but no shortness of breath or purulence.  Pain controlled with PCA.  No flatus or BM but feels some rectal pressure.  Objective: Vital signs in last 24 hours: Temp:  [98.1 F (36.7 C)-98.9 F (37.2 C)] 98.1 F (36.7 C) (11/13 0339) Pulse Rate:  [87-113] 87 (11/13 0339) Resp:  [15-25] 18 (11/13 0340) BP: (96-115)/(59-68) 107/59 mmHg (11/13 0339) SpO2:  [92 %-95 %] 92 % (11/13 0340) Weight change:  Last BM Date: 07/08/14  CBG (last 3)   Recent Labs  07/12/14 1656 07/12/14 2327 07/13/14 0537  GLUCAP 104* 115* 127*    Intake/Output from previous day: 11/12 0701 - 11/13 0700 In: 1930 [I.V.:350; IV Piggyback:560; TPN:1020] Out: 575 [Urine:425; Emesis/NG output:150] Intake/Output this shift:    General appearance: fatigued, no acute distress Eyes: no scleral icterus Throat: oropharynx moist without erythema Resp: clear to auscultation bilaterally Cardio: regular rate and rhythm GI: soft, non-tender; bowel sounds normal; no masses,  no organomegaly Extremities: no clubbing, cyanosis; trace edema left>rigth leg  Lab Results:  Recent Labs  07/12/14 0412 07/13/14 0500  NA 148* 148*  K 3.6* 3.6*  CL 111 112  CO2 26 26  GLUCOSE 138* 123*  BUN 21 16  CREATININE 0.90 0.78  CALCIUM 8.2* 7.9*  MG 1.8 2.1  PHOS 1.4* 2.7    Recent Labs  07/12/14 0412  AST 26  ALT 34  ALKPHOS 81  BILITOT 1.1  PROT 5.5*  ALBUMIN 2.1*    Recent Labs  07/11/14 0247 07/12/14 0412  WBC 9.6 10.5  HGB 15.1 12.8*  HCT 44.8 38.2*  MCV 99.6 98.5  PLT 134* 134*   Studies/Results: No results found.   Medications: Scheduled: . antiseptic oral rinse  7 mL Mouth Rinse BID  . enoxaparin (LOVENOX) injection  40 mg Subcutaneous Q24H  . fentaNYL   Intravenous 6 times per day  . insulin aspart  0-15 Units Subcutaneous 4 times per day  . levothyroxine  88 mcg Intravenous Daily  . metoprolol  2.5 mg Intravenous 4 times per day  .  sodium chloride  10-40 mL Intracatheter Q12H  . sodium chloride  10-40 mL Intracatheter Q12H  . tiotropium  18 mcg Inhalation Daily   Continuous: . amiodarone 30 mg/hr (07/13/14 0529)  . dextrose 5 % and 0.45 % NaCl with KCl 10 mEq/L 35 mL/hr at 07/12/14 1224  . Marland KitchenTPN (CLINIMIX-E) Adult 40 mL/hr at 07/12/14 1754   And  . fat emulsion 250 mL (07/12/14 1754)    Assessment/Plan: Principal Problem: 1. Small bowel obstruction secondary to adhesions and incisional hernia and adhesions- s/p Ex Lap with adhesiolysis, small bowel resection and open repair of incisional hernia (POD #3)- post-op management per [facility].  Active Problems: 2. Atrial fibrillation with rapid ventricular response- no recurrent Afib since admission- remains on Amiodarone perioperatively. Anticoagulation per Cardiology. Currently on Lovenox for DVT prophylaxis. 3. Acute Renal Failure- stable with hydration. 4. Hepatic cirrhosis- monitor LFTs with TNA. No evidence of ascites on CT. 5. Lung Cancer- no recurrence noted on CT 6. Hypothyroidism- Synthroid restarted at 1/2 dose IV- resume 136mcg po when tolerating pos.   7. COPD- stable. Continue Spiriva and use Xopenex as needed (avoid albuterol due to AFib).  Minimal cough but no other pulmonary concerns- will monitor. 8. Protein-Calorie Malnutrition- started on TNA yesterday- Diet per General Surgery. Will add Ensure when tolerating diet. 9. Disposition- per general surgery. Continue PT/OT.   **Request attending change to reflect General  Surgery as primary team. I will continue to follow medical issues.   LOS: 9 days   Joseph [name] 07/13/2014, 7:52 AM

## 2014-07-13 NOTE — Progress Notes (Signed)
Patient ID: Joseph [name], male   DOB: [birth year], 73 y.o.   MRN: [id number]   Patient Name: Joseph [name] Date of Encounter: 07/13/2014     Principal Problem:   Umbilical hernia, incarcerated- surgery 07/10/14 Active Problems:   Lung cancer   Hx of radiation therapy   Hepatic cirrhosis   CAD- non obstructive 2003   AAA- s/p Ao-Iliac BPG March 2015   Atrial fibrillation with rapid ventricular response   Small bowel obstruction   COPD (chronic obstructive pulmonary disease)   Hypothyroidism    SUBJECTIVE  No BM  Abdominal pains overnight    CURRENT MEDS . antiseptic oral rinse  7 mL Mouth Rinse BID  . enoxaparin (LOVENOX) injection  40 mg Subcutaneous Q24H  . fentaNYL   Intravenous 6 times per day  . insulin aspart  0-15 Units Subcutaneous 4 times per day  . levothyroxine  88 mcg Intravenous Daily  . metoprolol  2.5 mg Intravenous 4 times per day  . sodium chloride  10-40 mL Intracatheter Q12H  . sodium chloride  10-40 mL Intracatheter Q12H  . tiotropium  18 mcg Inhalation Daily    OBJECTIVE  Filed Vitals:   07/13/14 0339 07/13/14 0340 07/13/14 0745 07/13/14 0800  BP: 107/59  120/65   Pulse: 87  84   Temp: 98.1 F (36.7 C)  97.3 F (36.3 C)   TempSrc: Oral  Oral   Resp: 21 18 20 16   Height:      Weight:      SpO2: 94% 92% 93% 93%    Intake/Output Summary (Last 24 hours) at 07/13/14 0928 Last data filed at 07/13/14 0800  Gross per 24 hour  Intake 2293.8 ml  Output    575 ml  Net 1718.8 ml   Filed Weights   07/05/14 2347 07/07/14 0400 07/08/14 0400  Weight: 91.3 kg (201 lb 4.5 oz) 93.441 kg (206 lb) 93.078 kg (205 lb 3.2 oz)    PHYSICAL EXAM  General: Pleasant, NAD. Neuro: Alert and oriented X 3. Moves all extremities spontaneously. Psych: Normal affect. HEENT:  Normal  Neck: Supple without bruits or JVD. Lungs:  Resp regular and unlabored, CTA. Heart: RRR no s3, s4, or murmurs. Abdomen: Soft, non-tender, non-distended, BS + x 4.    Extremities: No clubbing, cyanosis or edema. DP/PT/Radials 2+ and equal bilaterally.  Accessory Clinical Findings  CBC  Recent Labs  07/11/14 0247 07/12/14 0412  WBC 9.6 10.5  HGB 15.1 12.8*  HCT 44.8 38.2*  MCV 99.6 98.5  PLT 134* 892*   Basic Metabolic Panel  Recent Labs  07/12/14 0412 07/13/14 0500  NA 148* 148*  K 3.6* 3.6*  CL 111 112  CO2 26 26  GLUCOSE 138* 123*  BUN 21 16  CREATININE 0.90 0.78  CALCIUM 8.2* 7.9*  MG 1.8 2.1  PHOS 1.4* 2.7   Liver Function Tests  Recent Labs  07/12/14 0412  AST 26  ALT 34  ALKPHOS 81  BILITOT 1.1  PROT 5.5*  ALBUMIN 2.1*   TELE NSR with HR high 80s to low 90s through out majority of the days, occasionally go up to 100s    ECG  No new EKG  Echocardiogram 10/12/2013  - Left ventricle: The cavity size was normal. Systolic function was normal. The estimated ejection fraction was in the range of 55% to 60%. Wall motion was normal; there were no regional wall motion abnormalities. There was an increased relative contribution of atrial contraction to ventricular filling. Doppler  parameters are consistent with abnormal left ventricular relaxation (grade 1 diastolic dysfunction). - Aortic valve: Moderate diffuse thickening and calcification, consistent with sclerosis. Trivial regurgitation. - Pulmonary arteries: PA peak pressure: 81mm Hg (S). Impressions:  - The right ventricular systolic pressure was increased consistent with mild pulmonary hypertension.    Radiology/Studies  Ct Chest W Contrast  07/03/2014   CLINICAL DATA:  CHEST CT ORDERED FOR RESTAGING LUNG CANCER BY DR. Julien Nordmann.  CTA OF THE ABDOMEN AND PELVIS ORDERED BY DR. Oneida Alar FOR TRIPLE A REPAIR FOLLOW-UP.  EXAM: CT CHEST WITH CONTRAST  CT ABDOMEN AND PELVIS ANGIOGRAPHY WITH AND WITHOUT CONTRAST  TECHNIQUE: Multidetector CT imaging of the chest was performed during intravenous contrast administration. Multidetector CT imaging of  the abdomen and pelvis was performed following the standard protocol before and during bolus administration of intravenous contrast.  CONTRAST:  1107mL OMNIPAQUE IOHEXOL 350 MG/ML SOLN  COMPARISON:  Abdominal CT 09/05/2013.  Chest CT 01/01/2014.  FINDINGS: CT CHEST FINDINGS  THORACIC INLET/BODY WALL:  Porta catheter from the left in stable position.  Atrophic or resected thyroid gland. No supraclavicular or axillary adenopathy.  MEDIASTINUM:  No cardiomegaly or pericardial effusion. Diffuse atherosclerosis, including the coronary arteries. No acute vascular findings.  No suspicious or enlarging mediastinal lymph nodes. Previously measured right juxta diaphragmatic node is smaller at 6 mm short axis. A upper prevascular node is stable at 6 mm short axis.  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 No soft tissue increase or nodular component suggestive of recurrent disease. Stable subpleural nodule along the right minor fissure measuring 9 mm. No new pulmonary nodule.  Panlobular and centrilobular emphysema. No pneumonia, edema, effusion, or pneumothorax.  UPPER ABDOMEN:  See below  OSSEOUS:  No acute fracture.  No suspicious lytic or blastic lesions.  CT ABDOMEN AND PELVIS FINDINGS  BODY WALL: There is an incisional hernia just above the umbilicus which contains small bowel and results in a partial small bowel obstruction.  Fatty right inguinal hernia which is small.  Liver: No focal abnormality.  Biliary: No evidence of biliary obstruction or stone.  Pancreas: Unremarkable.  Spleen: Unremarkable.  Adrenals: Unremarkable.  Kidneys and ureters: No hydronephrosis or stone.  Bladder: Unremarkable.  Reproductive: Enlarged prostate, deforming the bladder base. Morphology is stable from previous.  Bowel: There is dilated proximal small bowel leading to a transition point within the above described hernia sac. Some fluid is present within  the relatively decompressed distal small bowel, and the colon is not emptied, compatible with partial obstruction. No evidence of bowel devascularization/necrosis. Negative appendix. Extensive distal colonic diverticulosis without active inflammation.  Retroperitoneum: No mass or adenopathy.  Peritoneum: No ascites or pneumoperitoneum.  Vascular: Aortic branching is standard. 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  Status post bypass of an abdominal aortic aneurysm to the bilateral external iliacs and left internal iliac arteries. The graft is widely patent. Reimplanted IMA is widely patent. Branches of the left hypogastric artery are patent. The new maximal infrarenal aortic diameter measures 31 mm. The new maximal left common iliac artery diameter is 23 mm. The new maximal right common iliac artery diameter is 19 mm. These diameters include the collapsed aneurysm sac wall. The distal left high epigastric artery measures 15 mm in maximal diameter. No concerning periaortic fat infiltration or gas.  OSSEOUS: No acute abnormalities.  These results will be called to the ordering  clinician or representative by the Radiologist Assistant, and communication documented in the PACS or zVision Dashboard.  IMPRESSION: 1. Incisional hernia containing small bowel. There is related partial small bowel obstruction. 2. No evidence of recurrent lung cancer. 3. Status post aortic and iliac aneurysm bypass. New baseline anatomy is described above.   Electronically Signed   By: Jorje Guild M.D.   On: 07/03/2014 14:18   US Abdomen Complete  06/19/2014   CLINICAL DATA:  Upper abdominal pain.  Cirrhosis of the liver.  EXAM: ULTRASOUND ABDOMEN COMPLETE  COMPARISON:  09/05/2013  FINDINGS: Gallbladder: No gallstones or wall thickening visualized. No sonographic Murphy sign noted.  Common bile duct: Diameter: Normal caliber, 4 mm  Liver: Coarsened echotexture with nodular contours  compatible with cirrhosis. No focal abnormality.  IVC: No abnormality visualized.  Pancreas: Visualized portion unremarkable.  Spleen: Size and appearance within normal limits.  Right Kidney: Length: 11.8 cm. Echogenicity within normal limits. No mass or hydronephrosis visualized.  Left Kidney: Length: 12.4 cm. Echogenicity within normal limits. No mass or hydronephrosis visualized.  Abdominal aorta: No aneurysm visualized.  Other findings: None.  IMPRESSION: Changes of cirrhosis. No focal hepatic abnormality. No acute findings.   Electronically Signed   By: Rolm Baptise M.D.   On: 06/19/2014 08:25   Dg Abd 2 Views  07/09/2014   CLINICAL DATA:  Abdominal pain and distension with small bowel obstruction demonstrated on earlier studies  EXAM: ABDOMEN - 2 VIEW  COMPARISON:  Abdominal film of July 07, 2014  FINDINGS: There remain loops of moderately distended gas-filled small bowel throughout the abdomen. The volume of gas has decreased slightly. The esophagogastric tube tip and proximal port lie in the region of the gastric cardia. There is some stool and gas in the rectosigmoid. No free extraluminal gas collections are demonstrated.  IMPRESSION: Persistent small bowel obstruction. There may have been slight interval decrease in the volume of gas present within distended small bowel loops.   Electronically Signed   By: David  Martinique   On: 07/09/2014 09:00   Dg Abd 2 Views  07/07/2014   CLINICAL DATA:  Small bowel obstruction.  EXAM: ABDOMEN - 2 VIEW  COMPARISON:  Abdominal CT from 4 days prior  FINDINGS: Dilated bowel in the central abdomen which has progressed in diameter since 07/03/2014. No definite dilatation of colon. Gastric suction tube terminates in the proximal stomach. No evidence for perforation. No new concerning mass effect.  IMPRESSION: 1. Small bowel obstruction which has progressed since comparison 07/03/2014. 2. Orogastric tube in good position   Electronically Signed   By: Jorje Guild  M.D.   On: 07/07/2014 09:24   Ct Angio Abd/pel W/ And/or W/o  07/03/2014   CLINICAL DATA:  CHEST CT ORDERED FOR RESTAGING LUNG CANCER BY DR. Julien Nordmann.  CTA OF THE ABDOMEN AND PELVIS ORDERED BY DR. Oneida Alar FOR TRIPLE A REPAIR FOLLOW-UP.  EXAM: CT CHEST WITH CONTRAST  CT ABDOMEN AND PELVIS ANGIOGRAPHY WITH AND WITHOUT CONTRAST  TECHNIQUE: Multidetector CT imaging of the chest was performed during intravenous contrast administration. Multidetector CT imaging of the abdomen and pelvis was performed following the standard protocol before and during bolus administration of intravenous contrast.  CONTRAST:  144mL OMNIPAQUE IOHEXOL 350 MG/ML SOLN  COMPARISON:  Abdominal CT 09/05/2013.  Chest CT 01/01/2014.  FINDINGS: CT CHEST FINDINGS  THORACIC INLET/BODY WALL:  Porta catheter from the left in stable position.  Atrophic or resected thyroid gland. No supraclavicular or axillary adenopathy.  MEDIASTINUM:  No cardiomegaly  or pericardial effusion. Diffuse atherosclerosis, including the coronary arteries. No acute vascular findings.  No suspicious or enlarging mediastinal lymph nodes. Previously measured right juxta diaphragmatic node is smaller at 6 mm short axis. A upper prevascular node is stable at 6 mm short axis.  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 No soft tissue increase or nodular component suggestive of recurrent disease. Stable subpleural nodule along the right minor fissure measuring 9 mm. No new pulmonary nodule.  Panlobular and centrilobular emphysema. No pneumonia, edema, effusion, or pneumothorax.  UPPER ABDOMEN:  See below  OSSEOUS:  No acute fracture.  No suspicious lytic or blastic lesions.  CT ABDOMEN AND PELVIS FINDINGS  BODY WALL: There is an incisional hernia just above the umbilicus which contains small bowel and results in a partial small bowel obstruction.  Fatty right inguinal hernia which is small.  Liver:  No focal abnormality.  Biliary: No evidence of biliary obstruction or stone.  Pancreas: Unremarkable.  Spleen: Unremarkable.  Adrenals: Unremarkable.  Kidneys and ureters: No hydronephrosis or stone.  Bladder: Unremarkable.  Reproductive: Enlarged prostate, deforming the bladder base. Morphology is stable from previous.  Bowel: There is dilated proximal small bowel leading to a transition point within the above described hernia sac. Some fluid is present within the relatively decompressed distal small bowel, and the colon is not emptied, compatible with partial obstruction. No evidence of bowel devascularization/necrosis. Negative appendix. Extensive distal colonic diverticulosis without active inflammation.  Retroperitoneum: No mass or adenopathy.  Peritoneum: No ascites or pneumoperitoneum.  Vascular: Aortic branching is standard. 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  Status post bypass of an abdominal aortic aneurysm to the bilateral external iliacs and left internal iliac arteries. The graft is widely patent. Reimplanted IMA is widely patent. Branches of the left hypogastric artery are patent. The new maximal infrarenal aortic diameter measures 31 mm. The new maximal left common iliac artery diameter is 23 mm. The new maximal right common iliac artery diameter is 19 mm. These diameters include the collapsed aneurysm sac wall. The distal left high epigastric artery measures 15 mm in maximal diameter. No concerning periaortic fat infiltration or gas.  OSSEOUS: No acute abnormalities.  These results will be called to the ordering clinician or representative by the Radiologist Assistant, and communication documented in the PACS or zVision Dashboard.  IMPRESSION: 1. Incisional hernia containing small bowel. There is related partial small bowel obstruction. 2. No evidence of recurrent lung cancer. 3. Status post aortic and iliac aneurysm bypass. New baseline  anatomy is described above.   Electronically Signed   By: Jorje Guild M.D.   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 Continue on current therapy, NPO as patient still has  no bowel movement  since small bowel resection and ex lap for incarcerate intestine. Will plan to transition metoprolol and amiodarone to PO once able to tolerate clear liquid. Will readdress the issue with systemic anticoagulation once stable per surgery and able to take PO med.  However with history of cirrhosis may be best to defer  to outpatient and let hime recover from complicated surgery and address issue of anticoagulation latter with input from GI/hepatology     Jenkins Rouge

## 2014-07-13 NOTE — Evaluation (Signed)
Occupational Therapy Evaluation Patient Details Name: Joseph [name] MRN: [id number] DOB: 02-15-1941 Today's Date: 07/13/2014    History of Present Illness Adm 07/04/14 with abd pain; incarcerated umbilical hernia with exploratory laparotomy, small bowel resection, hernia repair 07/10/14. PMHx-lunc Ca (radiation tx), hepatic cirrhosis, AAA repair, afib, COPD   Clinical Impression   Pt was very active prior to his AAA repair in March, since then he was at a supervision to independent level in self care.  Pt presents with generalized weakness, impaired balance and abdominal pain interfering with ability to perform ADL and ADL transfers.  Pt also limited by multiple lines.  Will follow acutely.  Wife and pt are confident they can manage at home upon discharge.  Pt will benefit from a 3 in 1 for home use.    Follow Up Recommendations  Home health OT    Equipment Recommendations  3 in 1 bedside comode    Recommendations for Other Services       Precautions / Restrictions Precautions Precautions: Fall Precaution Comments: multiple lines/tubes Restrictions Weight Bearing Restrictions: No      Mobility Bed Mobility               General bed mobility comments: not assessed, pt up in chair  Transfers Overall transfer level: Needs assistance     Sit to Stand: Min assist;+2 safety/equipment              Balance                                            ADL Overall ADL's : Needs assistance/impaired Eating/Feeding: NPO   Grooming: Set up;Sitting   Upper Body Bathing: Minimal assitance;Sitting   Lower Body Bathing: Maximal assistance;Sit to/from stand   Upper Body Dressing : Minimal assistance;Sitting   Lower Body Dressing: Maximal assistance;Sit to/from stand   Toilet Transfer: +2 for physical assistance;Minimal assistance;Stand-pivot;BSC           Functional mobility during ADLs: +2 for physical assistance;Minimal  assistance;Rolling walker (for lines) General ADL Comments: Pt unable to access feet due to abdominal pain and distention.     Vision                     Perception     Praxis      Pertinent Vitals/Pain Pain Assessment: Faces Faces Pain Scale: Hurts even more Pain Location: abdomen Pain Descriptors / Indicators: Grimacing Pain Intervention(s): Repositioned;Monitored during session;Limited activity within patient's tolerance;PCA encouraged     Hand Dominance Right   Extremity/Trunk Assessment Upper Extremity Assessment Upper Extremity Assessment: Overall WFL for tasks assessed   Lower Extremity Assessment Lower Extremity Assessment: Defer to PT evaluation   Cervical / Trunk Assessment Cervical / Trunk Assessment: Kyphotic   Communication Communication Communication: No difficulties   Cognition Arousal/Alertness: Lethargic;Suspect due to medications Behavior During Therapy: [facility] for tasks assessed/performed Overall Cognitive Status: Within Functional Limits for tasks assessed                     General Comments       Exercises       Shoulder Instructions      Home Living Family/patient expects to be discharged to:: Private residence Living Arrangements: Spouse/significant other Available Help at Discharge: Family;Available 24 hours/day Type of Home: House Home Access: Level entry     Home  Layout: One level     Bathroom Shower/Tub: Occupational psychologist: Standard     Home Equipment: Cane - single point;Shower seat          Prior Functioning/Environment Level of Independence: Needs assistance  Gait / Transfers Assistance Needed: Independent ADL's / Homemaking Assistance Needed: wife assisted in/out of shower (since 3/15 AAA repair)   Comments: pt had slowed down considerably since his AAA    OT Diagnosis: Generalized weakness;Acute pain   OT Problem List: Decreased strength;Decreased activity tolerance;Impaired  balance (sitting and/or standing);Decreased knowledge of use of DME or AE;Obesity;Pain   OT Treatment/Interventions: Self-care/ADL training;DME and/or AE instruction;Therapeutic activities;Patient/family education;Balance training    OT Goals(Current goals can be found in the care plan section) Acute Rehab OT Goals Patient Stated Goal: return to active lifestyle OT Goal Formulation: With patient Time For Goal Achievement: 07/27/14 Potential to Achieve Goals: Good  OT Frequency: Min 2X/week   Barriers to D/C:            Co-evaluation              End of Session    Activity Tolerance: Patient limited by fatigue Patient left: in chair;with call bell/phone within reach;with nursing/sitter in room;with family/visitor present   Time: 1005-1020 OT Time Calculation (min): 15 min Charges:  OT General Charges $OT Visit: 1 Procedure OT Evaluation $Initial OT Evaluation Tier I: 1 Procedure OT Treatments $Self Care/Home Management : 8-22 mins G-Codes:    Malka So 07/13/2014, 10:35 AM [phone]

## 2014-07-13 NOTE — Progress Notes (Signed)
Report called to Rey, RN on [facility].

## 2014-07-13 NOTE — Progress Notes (Signed)
PARENTERAL NUTRITION CONSULT NOTE - Follow-up  Pharmacy Consult for TPN Indication: Prolonged ileus  No Known Allergies  Patient Measurements: Height: 6' (182.9 cm) Weight: 205 lb 3.2 oz (93.078 kg) IBW/kg (Calculated) : 77.6 Usual Weight: 93.1 kg  Vital Signs: Temp: 97.3 F (36.3 C) (11/13 0745) Temp Source: Oral (11/13 0745) BP: 120/65 mmHg (11/13 0745) Pulse Rate: 84 (11/13 0745) Intake/Output from previous day: 11/12 0701 - 11/13 0700 In: 2192.1 [I.V.:602.1; IV Piggyback:560; TPN:1030] Out: 575 [Urine:425; Emesis/NG output:150] Intake/Output from this shift: Total I/O In: 101.7 [I.V.:51.7; TPN:50] Out: 0   Labs:  Recent Labs  07/10/14 1250 07/11/14 0247 07/12/14 0412  WBC 2.4* 9.6 10.5  HGB 13.4 15.1 12.8*  HCT 40.7 44.8 38.2*  PLT 138* 134* 134*     Recent Labs  07/11/14 0247 07/12/14 0412 07/13/14 0500  NA 148* 148* 148*  K 4.4 3.6* 3.6*  CL 110 111 112  CO2 '23 26 26  ' GLUCOSE 115* 138* 123*  BUN '20 21 16  ' CREATININE 0.86 0.90 0.78  CALCIUM 8.2* 8.2* 7.9*  MG  --  1.8 2.1  PHOS  --  1.4* 2.7  PROT  --  5.5*  --   ALBUMIN  --  2.1*  --   AST  --  26  --   ALT  --  34  --   ALKPHOS  --  81  --   BILITOT  --  1.1  --   TRIG  --   --  138   Estimated Creatinine Clearance: 93 mL/min (by C-G formula based on Cr of 0.78).    Recent Labs  07/12/14 2327 07/13/14 0537 07/13/14 0751  GLUCAP 115* 127* 119*    Insulin Requirements in the past 12 hours:  2 units Novolog SSI  Current Nutrition:  Clinimix E 5/15 at 44m/hr and 20% lipids at 10 ml/hr provides 1162 kcal and 48 gm protein Goal: Clinimix E 5/15 at 1019mhr and 20% lipids at 106mr will provide 2184 kcal and 120 gm protein  Nutritional Goals:  2100-2300 kCal, 115-130 grams of protein per day (per RD recs 11/11)  Assessment: N/V x 3 weeks.SBO from chronically incarcerated ventral hernia with h/o previous repair with mesh. 11/10: S/p ex lap with LOA and SBR for SBO. Pharmacy  consulted to manage TPN due to not eating 5-6 days prior to surgery, now with ileus.  GI: Patient with ileus after 11/10 ex lap with LOA and SBR for SBO. Remains NPO. NGT in place. No flatus or BM yet but "feels rectal pressure" Continuing bowel rest  Endo: Hypothyroid (TSH 0.78), IV Synthroid (1/2 home dose). No h/o DM. Empiric SSI started with TPN initiation. CBGs < 150.  Lytes: K 3.6 (goal > 4 with ileus), Phos 2.7 << 1.4 (s/p Kphos 30 mmol x 1 on 11/12), Mg 2.1 << 1.8 (s/p Mg 2g x 1 on 11/12, goal >2 with ileus). Corr Ca~9.3. Na 148 - likely related to volume issues.  Renal: Scr stable. UOP 0.2 ml/kg/hr. D5-1/2NS with 10 mEq KCl 35 ml/hr.  Pulm: COPD/emphysema. 3L [place]. Spiriva  Cards: : h/o AAA, HLD, CAD. New afib. VSS. Pt now in NSR. IV metoprolol, IV Amio  Hepatobil: h/o cirrhosis. LFTs wnl/Tbili/Alk phos wnl on 11/12, alb 2.1 (11/12), pre-albumin 10.5 (11/12), TG 138 (11/13)  Neuro: Fent PCA for pain; pain score 2-5.  ID: Afebrile, WBC wnl. No abx.  Heme/Onc: h/o adenomatous colon polyps, lung cancer. Hgb down a bit to 12.8, Plts 134 (stable)  Best Practices: Lovenox 26m/d  TPN Access: Double lumen PICC 11/11  TPN day# 2 (11/11 >> current)  Plan:  1. Increase Clinimix E 5/15 to 75 ml/hr and 20% lipids at 10 ml/hr.  2. Continue with MVI, trace elements in TPN daily 3. KPhos 10 mmol x 1 dose (provides ~14.7 mEq KCl) 4. KCl 10 mEq IV x 4 runs 5. Continue sensitive SSI q6h 6. Red[facility]. Will f/u with BMET, Phos, CBGs in the AM  EAlycia Rossetti PharmD, BCPS Clinical Pharmacist Pager: [phone]/13/2015 9:42 AM

## 2014-07-14 DIAGNOSIS — I2510 Atherosclerotic heart disease of native coronary artery without angina pectoris: Secondary | ICD-10-CM

## 2014-07-14 LAB — BASIC METABOLIC PANEL
Anion gap: 10 (ref 5–15)
BUN: 16 mg/dL (ref 6–23)
CALCIUM: 7.8 mg/dL — AB (ref 8.4–10.5)
CO2: 26 meq/L (ref 19–32)
Chloride: 105 mEq/L (ref 96–112)
Creatinine, Ser: 0.64 mg/dL (ref 0.50–1.35)
GFR calc Af Amer: >90 mL/min (ref >90)
Glucose, Bld: 120 mg/dL — ABNORMAL HIGH (ref 70–99)
Potassium: 3.3 mEq/L — ABNORMAL LOW (ref 3.7–5.3)
SODIUM: 141 meq/L (ref 137–147)

## 2014-07-14 LAB — GLUCOSE, CAPILLARY
GLUCOSE-CAPILLARY: 109 mg/dL — AB (ref 70–99)
GLUCOSE-CAPILLARY: 90 mg/dL (ref 70–99)
Glucose-Capillary: 112 mg/dL — ABNORMAL HIGH (ref 70–99)
Glucose-Capillary: 122 mg/dL — ABNORMAL HIGH (ref 70–99)
Glucose-Capillary: 123 mg/dL — ABNORMAL HIGH (ref 70–99)

## 2014-07-14 LAB — PHOSPHORUS: PHOSPHORUS: 2.9 mg/dL (ref 2.3–4.6)

## 2014-07-14 MED ORDER — ACETAMINOPHEN 325 MG PO TABS
650.0000 mg | ORAL_TABLET | ORAL | Status: DC | PRN
  Administered 2014-07-14: 650 mg via ORAL
  Filled 2014-07-14 (×2): qty 2

## 2014-07-14 MED ORDER — FAT EMULSION 20 % IV EMUL
250.0000 mL | INTRAVENOUS | Status: AC
  Administered 2014-07-14: 250 mL via INTRAVENOUS
  Filled 2014-07-14: qty 250

## 2014-07-14 MED ORDER — PROMETHAZINE HCL 25 MG/ML IJ SOLN
12.5000 mg | INTRAMUSCULAR | Status: DC | PRN
  Administered 2014-07-14 – 2014-07-18 (×2): 12.5 mg via INTRAVENOUS
  Filled 2014-07-14 (×2): qty 1

## 2014-07-14 MED ORDER — TRACE MINERALS CR-CU-F-FE-I-MN-MO-SE-ZN IV SOLN
INTRAVENOUS | Status: AC
  Administered 2014-07-14: 18:00:00 via INTRAVENOUS
  Filled 2014-07-14: qty 2000

## 2014-07-14 MED ORDER — POTASSIUM CHLORIDE 10 MEQ/50ML IV SOLN
10.0000 meq | INTRAVENOUS | Status: AC
  Administered 2014-07-14 (×4): 10 meq via INTRAVENOUS
  Filled 2014-07-14 (×4): qty 50

## 2014-07-14 NOTE — Progress Notes (Signed)
Pt out of bed to recliner for 5 hours. Had clear liquids diet for lunch and supper. NGT D/C. Tolerated.

## 2014-07-14 NOTE — Progress Notes (Signed)
Patient ID: Joseph [name], male   DOB: 1941/06/12, 73 y.o.   MRN: [id number]   LOS: 10 days   POD#4  Subjective: Multiple e/o flatus, minimal nausea, no emesis. Pain controlled.   Objective: Vital signs in last 24 hours: Temp:  [97.4 F (36.3 C)-98.7 F (37.1 C)] 98.3 F (36.8 C) (11/14 0812) Pulse Rate:  [76-94] 76 (11/14 0812) Resp:  [12-23] 14 (11/14 0812) BP: (116-125)/(66-72) 120/70 mmHg (11/14 0812) SpO2:  [91 %-97 %] 93 % (11/14 0919) Weight:  [202 lb 8 oz (91.853 kg)] 202 lb 8 oz (91.853 kg) (11/14 0430) Last BM Date: 07/13/14   NGT: 562ml/24h   Laboratory  BMET  Recent Labs  07/13/14 0500 07/14/14 0455  NA 148* 141  K 3.6* 3.3*  CL 112 105  CO2 26 26  GLUCOSE 123* 120*  BUN 16 16  CREATININE 0.78 0.64  CALCIUM 7.9* 7.8*    Physical Exam General appearance: alert and no distress Resp: clear to auscultation bilaterally Cardio: regular rate and rhythm GI: Soft, +BS, incision clean, granulating   Assessment/Plan: Principal Problem:  Umbilical hernia, incarcerated- surgery 07/10/14 Active Problems:  Lung cancer  Hx of radiation therapy  Hepatic cirrhosis  CAD- non obstructive 2003  AAA- s/p Ao-Iliac BPG March 2015  Atrial fibrillation with rapid ventricular response  Small bowel obstruction  COPD (chronic obstructive pulmonary disease)  Hypothyroidism   D/C NGT, start clears VAC to abd wound Cont VTE prophylaxis Pt/ot tx to 3w Appreciate cards and medicine assist    Lisette Abu, PA-C Pager: [phone] 07/14/2014

## 2014-07-14 NOTE — Progress Notes (Signed)
PARENTERAL NUTRITION CONSULT NOTE - Follow-up  Pharmacy Consult for TPN Indication: Prolonged ileus  No Known Allergies  Patient Measurements: Height: 6' (182.9 cm) Weight: 202 lb 8 oz (91.853 kg) IBW/kg (Calculated) : 77.6 Usual Weight: 93.1 kg  Vital Signs: Temp: 98.3 F (36.8 C) (11/14 0812) Temp Source: Oral (11/14 0812) BP: 120/70 mmHg (11/14 0812) Pulse Rate: 76 (11/14 0812) Intake/Output from previous day: 11/13 0701 - 11/14 0700 In: 1521.9 [I.V.:151.9; TPN:1370] Out: 725 [Urine:175; Emesis/NG output:550] Intake/Output from this shift: Total I/O In: -  Out: 120 [Urine:120]  Labs:  Recent Labs  07/12/14 0412  WBC 10.5  HGB 12.8*  HCT 38.2*  PLT 134*     Recent Labs  07/12/14 0412 07/13/14 0500 07/14/14 0455  NA 148* 148* 141  K 3.6* 3.6* 3.3*  CL 111 112 105  CO2 26 26 26  GLUCOSE 138* 123* 120*  BUN 21 16 16  CREATININE 0.90 0.78 0.64  CALCIUM 8.2* 7.9* 7.8*  MG 1.8 2.1  --   PHOS 1.4* 2.7 2.9  PROT 5.5*  --   --   ALBUMIN 2.1*  --   --   AST 26  --   --   ALT 34  --   --   ALKPHOS 81  --   --   BILITOT 1.1  --   --   PREALBUMIN  --  6.6*  --   TRIG  --  138  --    Estimated Creatinine Clearance: 93 mL/min (by C-G formula based on Cr of 0.64).    Recent Labs  07/13/14 1758 07/14/14 0019 07/14/14 0641  GLUCAP 137* 112* 122*    Insulin Requirements in the past 12 hours:  6 units Novolog SSI  Nutritional Goals:  2100-2300 kCal, 115-130 grams of protein per day (per RD recs 11/11)  Goal: Clinimix E 5/15 at 100ml/hr and 20% lipids at 10ml/hr will provide 2184 kcal and 120 gm protein  Assessment: N/V x 3 weeks.SBO from chronically incarcerated ventral hernia with h/o previous repair with mesh. 11/10: S/p ex lap with LOA and SBR for SBO. Pharmacy consulted to manage TPN due to not eating 5-6 days prior to surgery, now with ileus.  GI: Patient with ileus after 11/10 ex lap with LOA and SBR for SBO. Remains NPO. NGT in place. + stool  x 2 on 11/13. Emesis/NG=550. +BS noted.  Endo: Hypothyroid (TSH 0.78), IV Synthroid (1/2 home dose). No h/o DM. Empiric SSI started with TPN initiation. CBGs < 150.  Lytes: K=3.3. Phos 2.9 replaced  Renal: Scr stable.   Pulm: COPD/emphysema. 2L [place]. 93%. Spiriva  Cards: : h/o AAA, HLD, CAD. New afib. VSS. Pt now in NSR. IV metoprolol, IV Amio  Hepatobil: h/o cirrhosis. LFTs wnl/Tbili/Alk phos wnl on 11/12, alb 2.1 (11/12), pre-albumin 6.6 (11/13), TG 138 (11/13)  Neuro: Fent PCA for pain  ID: Afebrile, WBC wnl. No abx.  Heme/Onc: h/o adenomatous colon polyps, lung cancer. Hgb down a bit to 12.8, Plts 134 (stable)  Best Practices: Lovenox 40mg/d  TPN Access: Double lumen PICC 11/11  TPN day# 3(11/11 >> current)  Plan:  1. Clinimix E 5/15 to 75 ml/hr and 20% lipids at 10 ml/hr. --will not advance due to starting diet 2. Continue with MVI, trace elements in TPN daily 3 Continue sensitive SSI q6h 4. D/c NG 5. Start clear liquids 6. Kruns x4   Crystal S. Robertson, PharmD, BCPS Clinical Staff Pharmacist Pager [phone]  07/14/2014 10:44 AM    

## 2014-07-14 NOTE — Progress Notes (Signed)
SUBJECTIVE:  Denies any chest pain or SOB.  Passing some gas  OBJECTIVE:   Vitals:   Filed Vitals:   07/14/14 0430 07/14/14 0800 07/14/14 0812 07/14/14 0919  BP: 120/70  120/70   Pulse: 81  76   Temp: 98.3 F (36.8 C)  98.3 F (36.8 C)   TempSrc: Oral  Oral   Resp: 14 22 14    Height:      Weight: 202 lb 8 oz (91.853 kg)     SpO2: 91% 94% 92% 93%   I&O's:   Intake/Output Summary (Last 24 hours) at 07/14/14 1157 Last data filed at 07/14/14 0941  Gross per 24 hour  Intake 1220.1 ml  Output    845 ml  Net  375.1 ml   TELEMETRY: Reviewed telemetry pt in NSR:     PHYSICAL EXAM General: Well developed, well nourished, in no acute distress Head: Eyes PERRLA, No xanthomas.   Normal cephalic and atramatic  Lungs:   Clear bilaterally to auscultation and percussion. Heart:   HRRR S1 S2 Pulses are 2+ & equal. Extremities:   No clubbing, cyanosis or edema.  DP +1 Neuro: Alert and oriented X 3. Psych:  Good affect, responds appropriately   LABS: Basic Metabolic Panel:  Recent Labs  07/12/14 0412 07/13/14 0500 07/14/14 0455  NA 148* 148* 141  K 3.6* 3.6* 3.3*  CL 111 112 105  CO2 26 26 26   GLUCOSE 138* 123* 120*  BUN 21 16 16   CREATININE 0.90 0.78 0.64  CALCIUM 8.2* 7.9* 7.8*  MG 1.8 2.1  --   PHOS 1.4* 2.7 2.9   Liver Function Tests:  Recent Labs  07/12/14 0412  AST 26  ALT 34  ALKPHOS 81  BILITOT 1.1  PROT 5.5*  ALBUMIN 2.1*   No results for input(s): LIPASE, AMYLASE in the last 72 hours. CBC:  Recent Labs  07/12/14 0412  WBC 10.5  HGB 12.8*  HCT 38.2*  MCV 98.5  PLT 134*   Cardiac Enzymes: No results for input(s): CKTOTAL, CKMB, CKMBINDEX, TROPONINI in the last 72 hours. BNP: Invalid input(s): POCBNP D-Dimer: No results for input(s): DDIMER in the last 72 hours. Hemoglobin A1C: No results for input(s): HGBA1C in the last 72 hours. Fasting Lipid Panel:  Recent Labs  07/13/14 0500  TRIG 138   Thyroid Function Tests: No results  for input(s): TSH, T4TOTAL, T3FREE, THYROIDAB in the last 72 hours.  Invalid input(s): FREET3 Anemia Panel: No results for input(s): VITAMINB12, FOLATE, FERRITIN, TIBC, IRON, RETICCTPCT in the last 72 hours. Coag Panel:   Lab Results  Component Value Date   INR 1.29 11/01/2013   INR 1.37 11/01/2013   INR 1.32 10/31/2013    RADIOLOGY: Ct Chest W Contrast  07/03/2014   CLINICAL DATA:  CHEST CT ORDERED FOR RESTAGING LUNG CANCER BY DR. Julien Nordmann.  CTA OF THE ABDOMEN AND PELVIS ORDERED BY DR. Oneida Alar FOR TRIPLE A REPAIR FOLLOW-UP.  EXAM: CT CHEST WITH CONTRAST  CT ABDOMEN AND PELVIS ANGIOGRAPHY WITH AND WITHOUT CONTRAST  TECHNIQUE: Multidetector CT imaging of the chest was performed during intravenous contrast administration. Multidetector CT imaging of the abdomen and pelvis was performed following the standard protocol before and during bolus administration of intravenous contrast.  CONTRAST:  143mL OMNIPAQUE IOHEXOL 350 MG/ML SOLN  COMPARISON:  Abdominal CT 09/05/2013.  Chest CT 01/01/2014.  FINDINGS: CT CHEST FINDINGS  THORACIC INLET/BODY WALL:  Porta catheter from the left in stable position.  Atrophic or resected thyroid gland. No supraclavicular  or axillary adenopathy.  MEDIASTINUM:  No cardiomegaly or pericardial effusion. Diffuse atherosclerosis, including the coronary arteries. No acute vascular findings.  No suspicious or enlarging mediastinal lymph nodes. Previously measured right juxta diaphragmatic node is smaller at 6 mm short axis. A upper prevascular node is stable at 6 mm short axis.  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 No soft tissue increase or nodular component suggestive of recurrent disease. Stable subpleural nodule along the right minor fissure measuring 9 mm. No new pulmonary nodule.  Panlobular and centrilobular emphysema. No pneumonia, edema, effusion, or pneumothorax.  UPPER ABDOMEN:  See  below  OSSEOUS:  No acute fracture.  No suspicious lytic or blastic lesions.  CT ABDOMEN AND PELVIS FINDINGS  BODY WALL: There is an incisional hernia just above the umbilicus which contains small bowel and results in a partial small bowel obstruction.  Fatty right inguinal hernia which is small.  Liver: No focal abnormality.  Biliary: No evidence of biliary obstruction or stone.  Pancreas: Unremarkable.  Spleen: Unremarkable.  Adrenals: Unremarkable.  Kidneys and ureters: No hydronephrosis or stone.  Bladder: Unremarkable.  Reproductive: Enlarged prostate, deforming the bladder base. Morphology is stable from previous.  Bowel: There is dilated proximal small bowel leading to a transition point within the above described hernia sac. Some fluid is present within the relatively decompressed distal small bowel, and the colon is not emptied, compatible with partial obstruction. No evidence of bowel devascularization/necrosis. Negative appendix. Extensive distal colonic diverticulosis without active inflammation.  Retroperitoneum: No mass or adenopathy.  Peritoneum: No ascites or pneumoperitoneum.  Vascular: Aortic branching is standard. 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  Status post bypass of an abdominal aortic aneurysm to the bilateral external iliacs and left internal iliac arteries. The graft is widely patent. Reimplanted IMA is widely patent. Branches of the left hypogastric artery are patent. The new maximal infrarenal aortic diameter measures 31 mm. The new maximal left common iliac artery diameter is 23 mm. The new maximal right common iliac artery diameter is 19 mm. These diameters include the collapsed aneurysm sac wall. The distal left high epigastric artery measures 15 mm in maximal diameter. No concerning periaortic fat infiltration or gas.  OSSEOUS: No acute abnormalities.  These results will be called to the ordering clinician or representative by  the Radiologist Assistant, and communication documented in the PACS or zVision Dashboard.  IMPRESSION: 1. Incisional hernia containing small bowel. There is related partial small bowel obstruction. 2. No evidence of recurrent lung cancer. 3. Status post aortic and iliac aneurysm bypass. New baseline anatomy is described above.   Electronically Signed   By: Jorje Guild M.D.   On: 07/03/2014 14:18   US Abdomen Complete  06/19/2014   CLINICAL DATA:  Upper abdominal pain.  Cirrhosis of the liver.  EXAM: ULTRASOUND ABDOMEN COMPLETE  COMPARISON:  09/05/2013  FINDINGS: Gallbladder: No gallstones or wall thickening visualized. No sonographic Murphy sign noted.  Common bile duct: Diameter: Normal caliber, 4 mm  Liver: Coarsened echotexture with nodular contours compatible with cirrhosis. No focal abnormality.  IVC: No abnormality visualized.  Pancreas: Visualized portion unremarkable.  Spleen: Size and appearance within normal limits.  Right Kidney: Length: 11.8 cm. Echogenicity within normal limits. No mass or hydronephrosis visualized.  Left Kidney: Length: 12.4 cm. Echogenicity within normal limits. No mass or hydronephrosis visualized.  Abdominal aorta: No aneurysm visualized.  Other findings: None.  IMPRESSION: Changes of cirrhosis. No focal hepatic abnormality. No acute findings.   Electronically Signed   By: Rolm Baptise M.D.   On: 06/19/2014 08:25   Dg Abd 2 Views  07/09/2014   CLINICAL DATA:  Abdominal pain and distension with small bowel obstruction demonstrated on earlier studies  EXAM: ABDOMEN - 2 VIEW  COMPARISON:  Abdominal film of July 07, 2014  FINDINGS: There remain loops of moderately distended gas-filled small bowel throughout the abdomen. The volume of gas has decreased slightly. The esophagogastric tube tip and proximal port lie in the region of the gastric cardia. There is some stool and gas in the rectosigmoid. No free extraluminal gas collections are demonstrated.  IMPRESSION: Persistent  small bowel obstruction. There may have been slight interval decrease in the volume of gas present within distended small bowel loops.   Electronically Signed   By: David  Martinique   On: 07/09/2014 09:00   Dg Abd 2 Views  07/07/2014   CLINICAL DATA:  Small bowel obstruction.  EXAM: ABDOMEN - 2 VIEW  COMPARISON:  Abdominal CT from 4 days prior  FINDINGS: Dilated bowel in the central abdomen which has progressed in diameter since 07/03/2014. No definite dilatation of colon. Gastric suction tube terminates in the proximal stomach. No evidence for perforation. No new concerning mass effect.  IMPRESSION: 1. Small bowel obstruction which has progressed since comparison 07/03/2014. 2. Orogastric tube in good position   Electronically Signed   By: Jorje Guild M.D.   On: 07/07/2014 09:24   Ct Angio Abd/pel W/ And/or W/o  07/03/2014   CLINICAL DATA:  CHEST CT ORDERED FOR RESTAGING LUNG CANCER BY DR. Julien Nordmann.  CTA OF THE ABDOMEN AND PELVIS ORDERED BY DR. Oneida Alar FOR TRIPLE A REPAIR FOLLOW-UP.  EXAM: CT CHEST WITH CONTRAST  CT ABDOMEN AND PELVIS ANGIOGRAPHY WITH AND WITHOUT CONTRAST  TECHNIQUE: Multidetector CT imaging of the chest was performed during intravenous contrast administration. Multidetector CT imaging of the abdomen and pelvis was performed following the standard protocol before and during bolus administration of intravenous contrast.  CONTRAST:  176mL OMNIPAQUE IOHEXOL 350 MG/ML SOLN  COMPARISON:  Abdominal CT 09/05/2013.  Chest CT 01/01/2014.  FINDINGS: CT CHEST FINDINGS  THORACIC INLET/BODY WALL:  Porta catheter from the left in stable position.  Atrophic or resected thyroid gland. No supraclavicular or axillary adenopathy.  MEDIASTINUM:  No cardiomegaly or pericardial effusion. Diffuse atherosclerosis, including the coronary arteries. No acute vascular findings.  No suspicious or enlarging mediastinal lymph nodes. Previously measured right juxta diaphragmatic node is smaller at 6 mm short axis. A upper  prevascular node is stable at 6 mm short axis.  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 No soft tissue increase or nodular component suggestive of recurrent disease. Stable subpleural nodule along the right minor fissure measuring 9 mm. No new pulmonary nodule.  Panlobular and centrilobular emphysema. No pneumonia, edema, effusion, or pneumothorax.  UPPER ABDOMEN:  See below  OSSEOUS:  No acute fracture.  No suspicious lytic or blastic lesions.  CT ABDOMEN AND PELVIS FINDINGS  BODY WALL: There is an incisional hernia just above the umbilicus which contains small bowel and results in a partial small bowel obstruction.  Fatty right inguinal hernia which is small.  Liver: No focal abnormality.  Biliary: No evidence of biliary obstruction or stone.  Pancreas: Unremarkable.  Spleen: Unremarkable.  Adrenals: Unremarkable.  Kidneys and ureters: No hydronephrosis or  stone.  Bladder: Unremarkable.  Reproductive: Enlarged prostate, deforming the bladder base. Morphology is stable from previous.  Bowel: There is dilated proximal small bowel leading to a transition point within the above described hernia sac. Some fluid is present within the relatively decompressed distal small bowel, and the colon is not emptied, compatible with partial obstruction. No evidence of bowel devascularization/necrosis. Negative appendix. Extensive distal colonic diverticulosis without active inflammation.  Retroperitoneum: No mass or adenopathy.  Peritoneum: No ascites or pneumoperitoneum.  Vascular: Aortic branching is standard. 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  Status post bypass of an abdominal aortic aneurysm to the bilateral external iliacs and left internal iliac arteries. The graft is widely patent. Reimplanted IMA is widely patent. Branches of the left hypogastric artery are patent. The new  maximal infrarenal aortic diameter measures 31 mm. The new maximal left common iliac artery diameter is 23 mm. The new maximal right common iliac artery diameter is 19 mm. These diameters include the collapsed aneurysm sac wall. The distal left high epigastric artery measures 15 mm in maximal diameter. No concerning periaortic fat infiltration or gas.  OSSEOUS: No acute abnormalities.  These results will be called to the ordering clinician or representative by the Radiologist Assistant, and communication documented in the PACS or zVision Dashboard.  IMPRESSION: 1. Incisional hernia containing small bowel. There is related partial small bowel obstruction. 2. No evidence of recurrent lung cancer. 3. Status post aortic and iliac aneurysm bypass. New baseline anatomy is described above.   Electronically Signed   By: Jorje Guild M.D.   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 Continue on current therapy, NPO as patient still has no bowel movement since small bowel resection and ex lap for incarcerate intestine. Will plan to transition metoprolol and amiodarone to PO once able to tolerate clear liquid. Will readdress the issue with systemic anticoagulation once stable per surgery and able to take PO med. However with history of cirrhosis may be best to defer to outpatient and let him recover from complicated surgery and address issue of anticoagulation latter with input from GI/hepatology.  Replete potassium per Primary service.    Joseph Margarita, MD  07/14/2014  11:57 AM

## 2014-07-14 NOTE — Progress Notes (Signed)
Subjective: Feels better- out of bed in chair.  Small amount of flatus.  NG tube clamped and had clear liquids for lunch which he enjoyed.  Objective: Vital signs in last 24 hours: Temp:  [98.3 F (36.8 C)-98.7 F (37.1 C)] 98.3 F (36.8 C) (11/14 0812) Pulse Rate:  [70-88] 80 (11/14 1200) Resp:  [12-23] 18 (11/14 1258) BP: (116-131)/(66-72) 131/71 mmHg (11/14 1200) SpO2:  [90 %-94 %] 91 % (11/14 1258) Weight:  [91.853 kg (202 lb 8 oz)] 91.853 kg (202 lb 8 oz) (11/14 0430) Weight change:  Last BM Date: 07/13/14  CBG (last 3)   Recent Labs  07/14/14 0019 07/14/14 0641 07/14/14 1134  GLUCAP 112* 122* 123*    Intake/Output from previous day: 11/13 0701 - 11/14 0700 In: 1521.9 [I.V.:151.9; TPN:1370] Out: 725 [Urine:175; Emesis/NG output:550] Intake/Output this shift: Total I/O In: -  Out: 120 [Urine:120]  General appearance: alert and no distress Eyes: no scleral icterus Throat: oropharynx moist without erythema Resp: clear to auscultation bilaterally Cardio: regular rate and rhythm [id number] with minimal bowel sounds;  Incision dressed Extremities: no clubbing, cyanosis or edema   Lab Results:  Recent Labs  07/12/14 0412 07/13/14 0500 07/14/14 0455  NA 148* 148* 141  K 3.6* 3.6* 3.3*  CL 111 112 105  CO2 26 26 26   GLUCOSE 138* 123* 120*  BUN 21 16 16   CREATININE 0.90 0.78 0.64  CALCIUM 8.2* 7.9* 7.8*  MG 1.8 2.1  --   PHOS 1.4* 2.7 2.9    Recent Labs  07/12/14 0412  AST 26  ALT 34  ALKPHOS 81  BILITOT 1.1  PROT 5.5*  ALBUMIN 2.1*    Recent Labs  07/12/14 0412  WBC 10.5  HGB 12.8*  HCT 38.2*  MCV 98.5  PLT 134*   Lab Results  Component Value Date   INR 1.29 11/01/2013   INR 1.37 11/01/2013   INR 1.32 10/31/2013   No results for input(s): CKTOTAL, CKMB, CKMBINDEX, TROPONINI in the last 72 hours. No results for input(s): TSH, T4TOTAL, T3FREE, THYROIDAB in the last 72 hours.  Invalid input(s): FREET3 No results for input(s):  VITAMINB12, FOLATE, FERRITIN, TIBC, IRON, RETICCTPCT in the last 72 hours.  Studies/Results: No results found.   Medications: Scheduled: . antiseptic oral rinse  7 mL Mouth Rinse BID  . enoxaparin (LOVENOX) injection  40 mg Subcutaneous Q24H  . fentaNYL   Intravenous 6 times per day  . insulin aspart  0-15 Units Subcutaneous 4 times per day  . levothyroxine  88 mcg Intravenous Daily  . metoprolol  2.5 mg Intravenous 4 times per day  . potassium chloride  10 mEq Intravenous Q1 Hr x 4  . sodium chloride  10-40 mL Intracatheter Q12H  . tiotropium  18 mcg Inhalation Daily   Continuous: . amiodarone 30 mg/hr (07/14/14 0330)  . dextrose 5 % and 0.45 % NaCl with KCl 10 mEq/L    . Marland KitchenTPN (CLINIMIX-E) Adult 75 mL/hr at 07/13/14 1718   And  . fat emulsion 250 mL (07/13/14 1718)  . Marland KitchenTPN (CLINIMIX-E) Adult     And  . fat emulsion      Assessment/Plan: 1. Small bowel obstruction secondary to adhesions and incisional hernia and adhesions- s/p Ex Lap with adhesiolysis, small bowel resection and open repair of incisional hernia (POD #4)- post-op management per [facility].  Active Problems: 2. Atrial fibrillation with rapid ventricular response- no recurrent Afib since admission- remains on Amiodarone perioperatively. Anticoagulation per Cardiology. Currently on Lovenox for  DVT prophylaxis.  In absence of recurrent Afib and with increased risk of bleeding with cirrhosis it may be prudent to avoid long-term anticoagulation. 3. Acute Renal Failure- stable with hydration. 4. Hepatic cirrhosis- monitor LFTs with TNA. No evidence of ascites on CT. 5. Lung Cancer- no recurrence noted on CT 6. Hypothyroidism- Synthroid restarted at 1/2 dose IV- will change to 189mcg po when tolerating pos.  7. COPD- stable. Continue Spiriva and use Xopenex as needed (avoid albuterol due to AFib). 8. Protein-Calorie Malnutrition- continue TNA- Diet per General Surgery. Will add Ensure when tolerating diet. 9.  Disposition- per general surgery. Continue PT/OT.    LOS: 10 days   [name] 07/14/2014, 2:29 PM

## 2014-07-14 NOTE — Progress Notes (Signed)
Pt's temperature 101.4. Pt c/o of nausea. Prn zofran given. Md on call made aware. New order received for PO tylenol. Will cont to monitor pt.

## 2014-07-14 NOTE — Progress Notes (Signed)
Replaced pca syringe. 13 mL fentanyl WIS, witnessed by [employer].

## 2014-07-15 ENCOUNTER — Inpatient Hospital Stay (HOSPITAL_COMMUNITY): Payer: Medicare Other

## 2014-07-15 DIAGNOSIS — R5082 Postprocedural fever: Secondary | ICD-10-CM | POA: Diagnosis not present

## 2014-07-15 DIAGNOSIS — I4891 Unspecified atrial fibrillation: Secondary | ICD-10-CM | POA: Insufficient documentation

## 2014-07-15 LAB — URINALYSIS, ROUTINE W REFLEX MICROSCOPIC
GLUCOSE, UA: NEGATIVE mg/dL
Hgb urine dipstick: NEGATIVE
Ketones, ur: NEGATIVE mg/dL
NITRITE: NEGATIVE
PROTEIN: NEGATIVE mg/dL
Specific Gravity, Urine: 1.022 (ref 1.005–1.030)
UROBILINOGEN UA: 2 mg/dL — AB (ref 0.0–1.0)
pH: 7 (ref 5.0–8.0)

## 2014-07-15 LAB — CBC
HCT: 32 % — ABNORMAL LOW (ref 39.0–52.0)
HEMOGLOBIN: 11.1 g/dL — AB (ref 13.0–17.0)
MCH: 35.5 pg — ABNORMAL HIGH (ref 26.0–34.0)
MCHC: 34.7 g/dL (ref 30.0–36.0)
MCV: 102.2 fL — ABNORMAL HIGH (ref 78.0–100.0)
PLATELETS: 137 10*3/uL — AB (ref 150–400)
RBC: 3.13 MIL/uL — ABNORMAL LOW (ref 4.22–5.81)
RDW: 14.7 % (ref 11.5–15.5)
WBC: 8.2 10*3/uL (ref 4.0–10.5)

## 2014-07-15 LAB — GLUCOSE, CAPILLARY
GLUCOSE-CAPILLARY: 107 mg/dL — AB (ref 70–99)
GLUCOSE-CAPILLARY: 120 mg/dL — AB (ref 70–99)
Glucose-Capillary: 118 mg/dL — ABNORMAL HIGH (ref 70–99)

## 2014-07-15 LAB — CLOSTRIDIUM DIFFICILE BY PCR: Toxigenic C. Difficile by PCR: NEGATIVE

## 2014-07-15 LAB — URINE MICROSCOPIC-ADD ON

## 2014-07-15 MED ORDER — FAT EMULSION 20 % IV EMUL
250.0000 mL | INTRAVENOUS | Status: DC
  Administered 2014-07-15: 250 mL via INTRAVENOUS
  Filled 2014-07-15: qty 250

## 2014-07-15 MED ORDER — CLINIMIX E/DEXTROSE (5/15) 5 % IV SOLN
INTRAVENOUS | Status: DC
  Administered 2014-07-15: 18:00:00 via INTRAVENOUS
  Filled 2014-07-15: qty 2400

## 2014-07-15 NOTE — Progress Notes (Signed)
Patient Name: Joseph [name] Date of Encounter: 07/15/2014     Principal Problem:   Umbilical hernia, incarcerated- surgery 07/10/14 Active Problems:   Lung cancer   Hx of radiation therapy   Hepatic cirrhosis   CAD- non obstructive 2003   AAA- s/p Ao-Iliac BPG March 2015   Atrial fibrillation with rapid ventricular response   Small bowel obstruction   COPD (chronic obstructive pulmonary disease)   Hypothyroidism    SUBJECTIVE  Patient had clear liquids diet for lunch and supper yesterday and then had a bad night of n/v and diarrhea. Now resting comfortably. No CP or SOB  CURRENT MEDS . antiseptic oral rinse  7 mL Mouth Rinse BID  . enoxaparin (LOVENOX) injection  40 mg Subcutaneous Q24H  . fentaNYL   Intravenous 6 times per day  . insulin aspart  0-15 Units Subcutaneous 4 times per day  . levothyroxine  88 mcg Intravenous Daily  . metoprolol  2.5 mg Intravenous 4 times per day  . sodium chloride  10-40 mL Intracatheter Q12H  . tiotropium  18 mcg Inhalation Daily    OBJECTIVE  Filed Vitals:   07/15/14 0350 07/15/14 0400 07/15/14 0410 07/15/14 0723  BP: 117/92     Pulse:      Temp:  98.8 F (37.1 C)    TempSrc:      Resp:  25 19   Height:      Weight:      SpO2:  95%  96%    Intake/Output Summary (Last 24 hours) at 07/15/14 0745 Last data filed at 07/14/14 2356  Gross per 24 hour  Intake 1062.5 ml  Output   1120 ml  Net  -57.5 ml   Filed Weights   07/07/14 0400 07/08/14 0400 07/14/14 0430  Weight: 206 lb (93.441 kg) 205 lb 3.2 oz (93.078 kg) 202 lb 8 oz (91.853 kg)    PHYSICAL EXAM  General: Pleasant, NAD. Neuro: Alert and oriented X 3. Moves all extremities spontaneously. Psych: Normal affect. HEENT:  Normal  Neck: Supple without bruits or JVD. Lungs:  Resp regular and unlabored, CTA. Heart: RRR no s3, s4, or murmurs. Abdomen: Soft, non-tender,distended, BS + x 4.  Extremities: No clubbing, cyanosis or edema. DP/PT/Radials 2+ and equal  bilaterally. Compression stockings.  Accessory Clinical Findings  CBC No results for input(s): WBC, NEUTROABS, HGB, HCT, MCV, PLT in the last 72 hours. Basic Metabolic Panel  Recent Labs  07/13/14 0500 07/14/14 0455  NA 148* 141  K 3.6* 3.3*  CL 112 105  CO2 26 26  GLUCOSE 123* 120*  BUN 16 16  CREATININE 0.78 0.64  CALCIUM 7.9* 7.8*  MG 2.1  --   PHOS 2.7 2.9   Fasting Lipid Panel  Recent Labs  07/13/14 0500  TRIG 138    TELE  NSR  Radiology/Studies  Ct Chest W Contrast  07/03/2014   CLINICAL DATA:  CHEST CT ORDERED FOR RESTAGING LUNG CANCER BY DR. Julien Nordmann.  CTA OF THE ABDOMEN AND PELVIS ORDERED BY DR. Oneida Alar FOR TRIPLE A REPAIR FOLLOW-UP.  EXAM: CT CHEST WITH CONTRAST  CT ABDOMEN AND PELVIS ANGIOGRAPHY WITH AND WITHOUT CONTRAST  TECHNIQUE: Multidetector CT imaging of the chest was performed during intravenous contrast administration. Multidetector CT imaging of the abdomen and pelvis was performed following the standard protocol before and during bolus administration of intravenous contrast.  CONTRAST:  138mL OMNIPAQUE IOHEXOL 350 MG/ML SOLN  COMPARISON:  Abdominal CT 09/05/2013.  Chest CT 01/01/2014.  FINDINGS: CT  CHEST FINDINGS  THORACIC INLET/BODY WALL:  Porta catheter from the left in stable position.  Atrophic or resected thyroid gland. No supraclavicular or axillary adenopathy.  MEDIASTINUM:  No cardiomegaly or pericardial effusion. Diffuse atherosclerosis, including the coronary arteries. No acute vascular findings.  No suspicious or enlarging mediastinal lymph nodes. Previously measured right juxta diaphragmatic node is smaller at 6 mm short axis. A upper prevascular node is stable at 6 mm short axis.  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 No soft tissue increase or nodular component suggestive of recurrent disease. Stable subpleural nodule along the right minor fissure  measuring 9 mm. No new pulmonary nodule.  Panlobular and centrilobular emphysema. No pneumonia, edema, effusion, or pneumothorax.  UPPER ABDOMEN:  See below  OSSEOUS:  No acute fracture.  No suspicious lytic or blastic lesions.  CT ABDOMEN AND PELVIS FINDINGS  BODY WALL: There is an incisional hernia just above the umbilicus which contains small bowel and results in a partial small bowel obstruction.  Fatty right inguinal hernia which is small.  Liver: No focal abnormality.  Biliary: No evidence of biliary obstruction or stone.  Pancreas: Unremarkable.  Spleen: Unremarkable.  Adrenals: Unremarkable.  Kidneys and ureters: No hydronephrosis or stone.  Bladder: Unremarkable.  Reproductive: Enlarged prostate, deforming the bladder base. Morphology is stable from previous.  Bowel: There is dilated proximal small bowel leading to a transition point within the above described hernia sac. Some fluid is present within the relatively decompressed distal small bowel, and the colon is not emptied, compatible with partial obstruction. No evidence of bowel devascularization/necrosis. Negative appendix. Extensive distal colonic diverticulosis without active inflammation.  Retroperitoneum: No mass or adenopathy.  Peritoneum: No ascites or pneumoperitoneum.  Vascular: Aortic branching is standard. 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  Status post bypass of an abdominal aortic aneurysm to the bilateral external iliacs and left internal iliac arteries. The graft is widely patent. Reimplanted IMA is widely patent. Branches of the left hypogastric artery are patent. The new maximal infrarenal aortic diameter measures 31 mm. The new maximal left common iliac artery diameter is 23 mm. The new maximal right common iliac artery diameter is 19 mm. These diameters include the collapsed aneurysm sac wall. The distal left high epigastric artery measures 15 mm in maximal diameter. No  concerning periaortic fat infiltration or gas.  OSSEOUS: No acute abnormalities.  These results will be called to the ordering clinician or representative by the Radiologist Assistant, and communication documented in the PACS or zVision Dashboard.  IMPRESSION: 1. Incisional hernia containing small bowel. There is related partial small bowel obstruction. 2. No evidence of recurrent lung cancer. 3. Status post aortic and iliac aneurysm bypass. New baseline anatomy is described above.   Electronically Signed   By: Jorje Guild M.D.   On: 07/03/2014 14:18   US Abdomen Complete  06/19/2014   CLINICAL DATA:  Upper abdominal pain.  Cirrhosis of the liver.  EXAM: ULTRASOUND ABDOMEN COMPLETE  COMPARISON:  09/05/2013  FINDINGS: Gallbladder: No gallstones or wall thickening visualized. No sonographic Murphy sign noted.  Common bile duct: Diameter: Normal caliber, 4 mm  Liver: Coarsened echotexture with nodular contours compatible with cirrhosis. No focal abnormality.  IVC: No abnormality visualized.  Pancreas: Visualized portion unremarkable.  Spleen: Size and appearance within normal limits.  Right Kidney: Length: 11.8 cm. Echogenicity within normal limits. No mass or hydronephrosis  visualized.  Left Kidney: Length: 12.4 cm. Echogenicity within normal limits. No mass or hydronephrosis visualized.  Abdominal aorta: No aneurysm visualized.  Other findings: None.  IMPRESSION: Changes of cirrhosis. No focal hepatic abnormality. No acute findings.   Electronically Signed   By: Rolm Baptise M.D.   On: 06/19/2014 08:25   Dg Abd 2 Views  07/09/2014   CLINICAL DATA:  Abdominal pain and distension with small bowel obstruction demonstrated on earlier studies  EXAM: ABDOMEN - 2 VIEW  COMPARISON:  Abdominal film of July 07, 2014  FINDINGS: There remain loops of moderately distended gas-filled small bowel throughout the abdomen. The volume of gas has decreased slightly. The esophagogastric tube tip and proximal port lie in the  region of the gastric cardia. There is some stool and gas in the rectosigmoid. No free extraluminal gas collections are demonstrated.  IMPRESSION: Persistent small bowel obstruction. There may have been slight interval decrease in the volume of gas present within distended small bowel loops.   Electronically Signed   By: David  Martinique   On: 07/09/2014 09:00   Dg Abd 2 Views  07/07/2014   CLINICAL DATA:  Small bowel obstruction.  EXAM: ABDOMEN - 2 VIEW  COMPARISON:  Abdominal CT from 4 days prior  FINDINGS: Dilated bowel in the central abdomen which has progressed in diameter since 07/03/2014. No definite dilatation of colon. Gastric suction tube terminates in the proximal stomach. No evidence for perforation. No new concerning mass effect.  IMPRESSION: 1. Small bowel obstruction which has progressed since comparison 07/03/2014. 2. Orogastric tube in good position   Electronically Signed   By: Jorje Guild M.D.   On: 07/07/2014 09:24   Ct Angio Abd/pel W/ And/or W/o  07/03/2014   CLINICAL DATA:  CHEST CT ORDERED FOR RESTAGING LUNG CANCER BY DR. Julien Nordmann.  CTA OF THE ABDOMEN AND PELVIS ORDERED BY DR. Oneida Alar FOR TRIPLE A REPAIR FOLLOW-UP.  EXAM: CT CHEST WITH CONTRAST  CT ABDOMEN AND PELVIS ANGIOGRAPHY WITH AND WITHOUT CONTRAST  TECHNIQUE: Multidetector CT imaging of the chest was performed during intravenous contrast administration. Multidetector CT imaging of the abdomen and pelvis was performed following the standard protocol before and during bolus administration of intravenous contrast.  CONTRAST:  148mL OMNIPAQUE IOHEXOL 350 MG/ML SOLN  COMPARISON:  Abdominal CT 09/05/2013.  Chest CT 01/01/2014.  FINDINGS: CT CHEST FINDINGS  THORACIC INLET/BODY WALL:  Porta catheter from the left in stable position.  Atrophic or resected thyroid gland. No supraclavicular or axillary adenopathy.  MEDIASTINUM:  No cardiomegaly or pericardial effusion. Diffuse atherosclerosis, including the coronary arteries. No acute  vascular findings.  No suspicious or enlarging mediastinal lymph nodes. Previously measured right juxta diaphragmatic node is smaller at 6 mm short axis. A upper prevascular node is stable at 6 mm short axis.  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 No soft tissue increase or nodular component suggestive of recurrent disease. Stable subpleural nodule along the right minor fissure measuring 9 mm. No new pulmonary nodule.  Panlobular and centrilobular emphysema. No pneumonia, edema, effusion, or pneumothorax.  UPPER ABDOMEN:  See below  OSSEOUS:  No acute fracture.  No suspicious lytic or blastic lesions.  CT ABDOMEN AND PELVIS FINDINGS  BODY WALL: There is an incisional hernia just above the umbilicus which contains small bowel and results in a partial small bowel obstruction.  Fatty right inguinal hernia which is small.  Liver: No focal abnormality.  Biliary:  No evidence of biliary obstruction or stone.  Pancreas: Unremarkable.  Spleen: Unremarkable.  Adrenals: Unremarkable.  Kidneys and ureters: No hydronephrosis or stone.  Bladder: Unremarkable.  Reproductive: Enlarged prostate, deforming the bladder base. Morphology is stable from previous.  Bowel: There is dilated proximal small bowel leading to a transition point within the above described hernia sac. Some fluid is present within the relatively decompressed distal small bowel, and the colon is not emptied, compatible with partial obstruction. No evidence of bowel devascularization/necrosis. Negative appendix. Extensive distal colonic diverticulosis without active inflammation.  Retroperitoneum: No mass or adenopathy.  Peritoneum: No ascites or pneumoperitoneum.  Vascular: Aortic branching is standard. 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  Status post bypass of an abdominal aortic aneurysm to the bilateral external  iliacs and left internal iliac arteries. The graft is widely patent. Reimplanted IMA is widely patent. Branches of the left hypogastric artery are patent. The new maximal infrarenal aortic diameter measures 31 mm. The new maximal left common iliac artery diameter is 23 mm. The new maximal right common iliac artery diameter is 19 mm. These diameters include the collapsed aneurysm sac wall. The distal left high epigastric artery measures 15 mm in maximal diameter. No concerning periaortic fat infiltration or gas.  OSSEOUS: No acute abnormalities.  These results will be called to the ordering clinician or representative by the Radiologist Assistant, and communication documented in the PACS or zVision Dashboard.  IMPRESSION: 1. Incisional hernia containing small bowel. There is related partial small bowel obstruction. 2. No evidence of recurrent lung cancer. 3. Status post aortic and iliac aneurysm bypass. New baseline anatomy is described above.   Electronically Signed   By: Jorje Guild M.D.   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 Maintaining NSR.  -- Continued on IV therapy because the patient was NPO s/p small bowel resection and ex lap for incarcerated intestine.  -- Patient had clear liquids diet for lunch and supper yesterday and then had a bad night of n/v and diarrhea -- Will plan to transition metoprolol and amiodarone to PO once able to tolerate clear liquid. CHADSVasc score of 2 but it may be prudent to avoid long-term anticoagulation due to  cirrhosis. Will convert to PO amio 200mg  daily for a month when he is tolerating PO   Hypokalemia- Replete potassium per Primary service. -- BMET pending this AM  Signed, Eileen Stanford PA-C  Pager [phone]  I have seen, examined the patient, and reviewed the above assessment and plan.  Changes to above are made where necessary.  Pt continues to slowly recover from SOB.  Would keep on IV amiodarone until improves from GI standpoint and then convert to amiodarone 200mg  daily x 4 weeks. CHads2vasc score is 2 (PVD, age).  I agree with Dr Brigitte Pulse that he is a poor candidate for anticoagulation at this time.  Hopefully once recovered from his acute medical illness his afib will not return.  Given cirrhosis, I would favor stopping amiodarone in 4 weeks if possible.  Co Sign:  Thompson Grayer, MD 07/15/2014 11:13 AM

## 2014-07-15 NOTE — Progress Notes (Signed)
Patient ID: Joseph [name], male   DOB: 1941-02-13, 73 y.o.   MRN: [id number]   LOS: 11 days   POD#5  Subjective: Had N/V overnight, better this morning but still with nausea. Having diarrhea as well.   Objective: Vital signs in last 24 hours: Temp:  [97.8 F (36.6 C)-101.4 F (38.6 C)] 97.8 F (36.6 C) (11/15 0800) Pulse Rate:  [80-114] 114 (11/14 2352) Resp:  [14-31] 23 (11/15 0800) BP: (117-137)/(70-92) 117/92 mmHg (11/15 0350) SpO2:  [91 %-97 %] 96 % (11/15 0723) Last BM Date: 07/15/14   Physical Exam General appearance: alert and no distress Resp: clear to auscultation bilaterally Cardio: regular rate and rhythm GI: Soft, diminished BS   Assessment/Plan: Principal Problem:  Umbilical hernia, incarcerated- surgery 07/10/14 Active Problems:  Lung cancer  Hx of radiation therapy  Hepatic cirrhosis  CAD- non obstructive 2003  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

## 2014-07-15 NOTE — Progress Notes (Signed)
PARENTERAL NUTRITION CONSULT NOTE - Follow-up  Pharmacy Consult for TPN Indication: Prolonged ileus  No Known Allergies  Patient Measurements: Height: 6' (182.9 cm) Weight: 202 lb 8 oz (91.853 kg) IBW/kg (Calculated) : 77.6 Usual Weight: 93.1 kg  Vital Signs: Temp: 98.8 F (37.1 C) (11/15 0400) BP: 117/92 mmHg (11/15 0350) Pulse Rate: 114 (11/14 2352) Intake/Output from previous day: 11/14 0701 - 11/15 0700 In: 1062.5 [IV Piggyback:200; TPN:862.5] Out: 1120 [Urine:1120] Intake/Output from this shift:    Labs: No results for input(s): WBC, HGB, HCT, PLT, APTT, INR in the last 72 hours.   Recent Labs  07/13/14 0500 07/14/14 0455  NA 148* 141  K 3.6* 3.3*  CL 112 105  CO2 26 26  GLUCOSE 123* 120*  BUN 16 16  CREATININE 0.78 0.64  CALCIUM 7.9* 7.8*  MG 2.1  --   PHOS 2.7 2.9  PREALBUMIN 6.6*  --   TRIG 138  --    Estimated Creatinine Clearance: 93 mL/min (by C-G formula based on Cr of 0.64).    Recent Labs  07/14/14 1759 07/14/14 2334 07/15/14 0621  GLUCAP 90 109* 118*    Insulin Requirements in the past 12 hours:  4 units Novolog SSI  Nutritional Goals:  2100-2300 kCal, 115-130 grams of protein per day (per RD recs 11/11)  Goal: Clinimix E 5/15 at 118m/hr and 20% lipids at 137mhr will provide 2184 kcal and 120 gm protein  Assessment: N/V x 3 weeks.SBO from chronically incarcerated ventral hernia with h/o previous repair with mesh. 11/10: S/p ex lap with LOA and SBR for SBO. Pharmacy consulted to manage TPN due to not eating 5-6 days prior to surgery, now with ileus.  GI: Patient with ileus after 11/10 ex lap with LOA and SBR for SBO. Vac to abdominal wound. + stool 11/14, 11/15. Diet of clears changed back to NPO PM 11/14 after pt had N/V and diarrhea.  Endo: Hypothyroid (TSH 0.78), IV Synthroid (1/2 home dose). No h/o DM. Empiric SSI started with TPN initiation. CBGs 90-125  Lytes: K=3.3. Phos 2.9 replaced. Next labs in AM  Renal: Scr stable.    Pulm: COPD/emphysema. 3 LNC. 96%. Spiriva  Cards: : h/o AAA, HLD, CAD. New afib. VSS. Pt now in NSR. IV metoprolol, IV Amio  Hepatobil: h/o cirrhosis. LFTs wnl/Tbili/Alk phos wnl on 11/12, alb 2.1 (11/12), pre-albumin 6.6 (11/13), TG 138 (11/13)  Neuro: Fent PCA for pain  ID: Tmax 101.4 last PM, afebrile now, WBC wnl. No abx.  Heme/Onc: h/o adenomatous colon polyps, lung cancer. Hgb down a bit to 12.8, Plts 134 (stable)  Best Practices: Lovenox 4057m  TPN Access: Double lumen PICC 11/11  TPN day# 4 (11/11 >> current)  Plan:  1. Clinimix E 5/15 to 100m36m and 20% lipids at 10 ml/hr--advance to goal with new bag tonight. 2. Continue with MVI, trace elements in TPN daily 3 Continue sensitive SSI q6h 4. Nutrition panel labs due in AM   Joseph [name] S. RobeAlford HighlandarmD, BCPSSalt Lake Regional Medical Centernical Staff Pharmacist Pager [phone] 0020/15/2015 7:47 AM

## 2014-07-15 NOTE — Progress Notes (Signed)
Subjective: Patient had formed BM yesterday afternoon followed by large amount of diarrhea last evening.  Increased nausea/vomitting overnight.  Fever up to 101.  Denies cough, shortness of breath, wheezing, dysuria.  Objective: Vital signs in last 24 hours: Temp:  [97.8 F (36.6 C)-101.4 F (38.6 C)] 97.8 F (36.6 C) (11/15 0800) Pulse Rate:  [80-114] 114 (11/14 2352) Resp:  [14-31] 23 (11/15 0800) BP: (117-137)/(70-92) 117/92 mmHg (11/15 0350) SpO2:  [91 %-97 %] 96 % (11/15 0723) Weight change:  Last BM Date: 07/15/14  CBG (last 3)   Recent Labs  07/14/14 1759 07/14/14 2334 07/15/14 0621  GLUCAP 90 109* 118*    Intake/Output from previous day: 11/14 0701 - 11/15 0700 In: 1062.5 [IV Piggyback:200; TPN:862.5] Out: 1120 [Urine:1120] Intake/Output this shift:    General appearance: fatigued Eyes: no scleral icterus Throat: oropharynx moist without erythema Resp: clear to auscultation bilaterally Cardio: regular rate and rhythm GI: distended with hypoactive bowel sounds; abdominal wound dressed Extremities: no clubbing, cyanosis or edema; SCDs in place   Lab Results:  Recent Labs  07/13/14 0500 07/14/14 0455  NA 148* 141  K 3.6* 3.3*  CL 112 105  CO2 26 26  GLUCOSE 123* 120*  BUN 16 16  CREATININE 0.78 0.64  CALCIUM 7.9* 7.8*  MG 2.1  --   PHOS 2.7 2.9   No results for input(s): AST, ALT, ALKPHOS, BILITOT, PROT, ALBUMIN in the last 72 hours. No results for input(s): WBC, NEUTROABS, HGB, HCT, MCV, PLT in the last 72 hours. Lab Results  Component Value Date   INR 1.29 11/01/2013   INR 1.37 11/01/2013   INR 1.32 10/31/2013   No results for input(s): CKTOTAL, CKMB, CKMBINDEX, TROPONINI in the last 72 hours. No results for input(s): TSH, T4TOTAL, T3FREE, THYROIDAB in the last 72 hours.  Invalid input(s): FREET3 No results for input(s): VITAMINB12, FOLATE, FERRITIN, TIBC, IRON, RETICCTPCT in the last 72 hours.  Studies/Results: No results  found.   Medications: Scheduled: . antiseptic oral rinse  7 mL Mouth Rinse BID  . enoxaparin (LOVENOX) injection  40 mg Subcutaneous Q24H  . fentaNYL   Intravenous 6 times per day  . insulin aspart  0-15 Units Subcutaneous 4 times per day  . levothyroxine  88 mcg Intravenous Daily  . metoprolol  2.5 mg Intravenous 4 times per day  . sodium chloride  10-40 mL Intracatheter Q12H  . tiotropium  18 mcg Inhalation Daily   Continuous: . amiodarone 30 mg/hr (07/15/14 0115)  . dextrose 5 % and 0.45 % NaCl with KCl 10 mEq/L    . Marland KitchenTPN (CLINIMIX-E) Adult 75 mL/hr at 07/14/14 1750   And  . fat emulsion 250 mL (07/14/14 1750)  . Marland KitchenTPN (CLINIMIX-E) Adult     And  . fat emulsion      Assessment/Plan: 1. Postoperative Fever- may be due to atelectasis.  Will obtain urinalysis, CXR, CBC for further evaluation.  Not currently on antibiotics and will hold on empiric antibiotics unless source identified.  Abdomen tender but expected postoperatively and appears to be improving.  Defer evaluation of wound to surgery. 2. Small bowel obstruction secondary to adhesions and incisional hernia and adhesions- s/p Ex Lap with adhesiolysis, small bowel resection and open repair of incisional hernia (POD #5)- post-op management per [facility].Continue Zofran and phenargan prn nausea. 3. Atrial fibrillation with rapid ventricular response- no recurrent Afib since admission- remains on Amiodarone perioperatively. Anticoagulation per Cardiology. Currently on Lovenox for DVT prophylaxis. In absence of recurrent Afib and  with increased risk of bleeding with cirrhosis it may be prudent to avoid long-term anticoagulation. 4. Acute Renal Failure- stable with hydration.Recheck today. 5. Hepatic cirrhosis- monitor LFTs with TNA. No evidence of ascites on CT. 6. Lung Cancer- no recurrence noted on CT 7. Hypothyroidism- Synthroid restarted at 1/2 dose IV- will change to 160mcg po when tolerating pos.  8. COPD- stable. Continue  Spiriva and use Xopenex as needed (avoid albuterol due to AFib). 9. Protein-Calorie Malnutrition- continue TNA- Diet per General Surgery. Will add Ensure when tolerating diet. 10. Disposition- per general surgery. Continue PT/OT.   LOS: 11 days   [name] 07/15/2014, 10:16 AM

## 2014-07-16 LAB — COMPREHENSIVE METABOLIC PANEL
ALT: 32 U/L (ref 0–53)
AST: 32 U/L (ref 0–37)
Albumin: 1.8 g/dL — ABNORMAL LOW (ref 3.5–5.2)
Alkaline Phosphatase: 157 U/L — ABNORMAL HIGH (ref 39–117)
Anion gap: 9 (ref 5–15)
BUN: 17 mg/dL (ref 6–23)
CALCIUM: 7.7 mg/dL — AB (ref 8.4–10.5)
CO2: 24 mEq/L (ref 19–32)
CREATININE: 0.68 mg/dL (ref 0.50–1.35)
Chloride: 104 mEq/L (ref 96–112)
GFR calc Af Amer: >90 mL/min (ref >90)
GFR calc non Af Amer: >90 mL/min (ref >90)
Glucose, Bld: 105 mg/dL — ABNORMAL HIGH (ref 70–99)
Potassium: 3.7 mEq/L (ref 3.7–5.3)
Sodium: 137 mEq/L (ref 137–147)
Total Bilirubin: 0.9 mg/dL (ref 0.3–1.2)
Total Protein: 5.3 g/dL — ABNORMAL LOW (ref 6.0–8.3)

## 2014-07-16 LAB — CBC
HEMATOCRIT: 30.8 % — AB (ref 39.0–52.0)
HEMOGLOBIN: 10.5 g/dL — AB (ref 13.0–17.0)
MCH: 33.2 pg (ref 26.0–34.0)
MCHC: 34.1 g/dL (ref 30.0–36.0)
MCV: 97.5 fL (ref 78.0–100.0)
Platelets: 173 10*3/uL (ref 150–400)
RBC: 3.16 MIL/uL — ABNORMAL LOW (ref 4.22–5.81)
RDW: 14.5 % (ref 11.5–15.5)
WBC: 8.7 10*3/uL (ref 4.0–10.5)

## 2014-07-16 LAB — GLUCOSE, CAPILLARY
GLUCOSE-CAPILLARY: 107 mg/dL — AB (ref 70–99)
GLUCOSE-CAPILLARY: 109 mg/dL — AB (ref 70–99)
GLUCOSE-CAPILLARY: 92 mg/dL (ref 70–99)
Glucose-Capillary: 76 mg/dL (ref 70–99)
Glucose-Capillary: 89 mg/dL (ref 70–99)

## 2014-07-16 LAB — PHOSPHORUS: PHOSPHORUS: 2.8 mg/dL (ref 2.3–4.6)

## 2014-07-16 LAB — MAGNESIUM: Magnesium: 1.7 mg/dL (ref 1.5–2.5)

## 2014-07-16 MED ORDER — POTASSIUM CHLORIDE 10 MEQ/50ML IV SOLN
10.0000 meq | INTRAVENOUS | Status: AC
Start: 2014-07-16 — End: 2014-07-16
  Administered 2014-07-16 (×4): 10 meq via INTRAVENOUS
  Filled 2014-07-16 (×3): qty 50

## 2014-07-16 MED ORDER — POTASSIUM CHLORIDE 10 MEQ/50ML IV SOLN
INTRAVENOUS | Status: AC
  Administered 2014-07-16: 10 meq via INTRAVENOUS
  Filled 2014-07-16: qty 50

## 2014-07-16 MED ORDER — MAGNESIUM SULFATE 2 GM/50ML IV SOLN
2.0000 g | Freq: Once | INTRAVENOUS | Status: AC
  Administered 2014-07-16: 2 g via INTRAVENOUS
  Filled 2014-07-16: qty 50

## 2014-07-16 MED ORDER — DEXTROSE 10 % IV SOLN
INTRAVENOUS | Status: AC
  Administered 2014-07-16: 1 mL via INTRAVENOUS

## 2014-07-16 NOTE — Consult Note (Addendum)
WOC wound consult note Reason for Consult: Requested to apply Vac dressing to abd wound by CCS team, who are following for assessment and plan of care. Wound type: Full thickness post-op wound Measurement: 25X7X2cm Wound bed: 80% beefy red, 10% black area, 10% yellow Drainage (amount, consistency, odor) Small amt yellow drainage, no odor Periwound: Intact skin surrounding Dressing procedure/placement/frequency: Applied 3 pieces of black foam cinnamon-roll type.  Pt used PCA for pain prior to dressing change and tolerated with minimal amt discomfort.  Cont suction on at 158mm.  Wife at bedside to assess wound.  Bedside nurse can change Q M/W/F. Please re-consult if further assistance is needed.  Thank-you,  Julien Girt MSN, [place], [place], [place], [place]

## 2014-07-16 NOTE — Progress Notes (Signed)
Subjective: Feeling better- nausea improved.  Minimal diarrhea yesterday afternoon, none overnight.  Pain controlled with decreased PCA requirement.  Minimal flatus.  Objective: Vital signs in last 24 hours: Temp:  [97.6 F (36.4 C)-100 F (37.8 C)] 99.3 F (37.4 C) (11/16 0320) Resp:  [12-27] 25 (11/16 0624) BP: (91-112)/(50-73) 91/50 mmHg (11/16 0624) SpO2:  [92 %-96 %] 93 % (11/16 0523) Weight:  [91.7 kg (202 lb 2.6 oz)] 91.7 kg (202 lb 2.6 oz) (11/16 0500) Weight change:  Last BM Date: 07/15/14  CBG (last 3)   Recent Labs  07/15/14 1219 07/15/14 1824 07/16/14 0010  GLUCAP 107* 120* 109*    Intake/Output from previous day: 11/15 0701 - 11/16 0700 In: 0  Out: 1740 [Urine:1740] Intake/Output this shift:    General appearance: fatigued and no acute distress Eyes: no scleral icterus Throat: oropharynx dry without erythema Resp: clear to auscultation bilaterally Cardio: regular rate and rhythm GI: soft, non-tender; bowel sounds normal; no masses,  no organomegaly Extremities: no clubbing, cyanosis or edema; no tenderness or erythema over PICC line   Lab Results:  Recent Labs  07/14/14 0455 07/16/14 0530  NA 141 137  K 3.3* 3.7  CL 105 104  CO2 26 24  GLUCOSE 120* 105*  BUN 16 17  CREATININE 0.64 0.68  CALCIUM 7.8* 7.7*  MG  --  1.7  PHOS 2.9 2.8    Recent Labs  07/16/14 0530  AST 32  ALT 32  ALKPHOS 157*  BILITOT 0.9  PROT 5.3*  ALBUMIN 1.8*    Recent Labs  07/15/14 1100 07/16/14 0530  WBC 8.2 8.7  HGB 11.1* 10.5*  HCT 32.0* 30.8*  MCV 102.2* 97.5  PLT 137* 173   Lab Results  Component Value Date   INR 1.29 11/01/2013   INR 1.37 11/01/2013   INR 1.32 10/31/2013   Urinalysis 0-2 WBCs C.diff negative  Studies/Results: Dg Chest 2 View  07/15/2014   CLINICAL DATA:  Postoperative fever. History of lung cancer and coronary artery disease. ICD10: R 50.82  EXAM: CHEST  2 VIEW  COMPARISON:  11/05/2013  FINDINGS: A left-sided  Port-A-Cath which terminates the mid SVC. A right-sided PICC line terminates at the low SVC.  Normal heart size. Left hemidiaphragm elevation is moderate. No pleural fluid. 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 There is also medial right apical opacity which is favored to be due to scarring. Lower lobe predominant interstitial thickening. No lobar consolidation. Minimal volume loss adjacent the left hemidiaphragm.  IMPRESSION: COPD/chronic bronchitis with areas of scarring and atelectasis. No lobar consolidation to suggest pneumonia.   Electronically Signed   By: Abigail Miyamoto M.D.   On: 07/15/2014 14:21     Medications: Scheduled: . antiseptic oral rinse  7 mL Mouth Rinse BID  . enoxaparin (LOVENOX) injection  40 mg Subcutaneous Q24H  . fentaNYL   Intravenous 6 times per day  . insulin aspart  0-15 Units Subcutaneous 4 times per day  . levothyroxine  88 mcg Intravenous Daily  . metoprolol  2.5 mg Intravenous 4 times per day  . sodium chloride  10-40 mL Intracatheter Q12H  . tiotropium  18 mcg Inhalation Daily   Continuous: . amiodarone 30 mg/hr (07/16/14 0020)  . dextrose 5 % and 0.45 % NaCl with KCl 10 mEq/L    . Marland KitchenTPN (CLINIMIX-E) Adult Stopped (07/16/14 0932)   And  . fat emulsion Stopped (07/16/14 0517)    Assessment/Plan: 1. Postoperative  Fever- No recurrence and WBC normal.  CXR clear, UA negative.  Suspect atelectasis.  2. Small bowel obstruction secondary to adhesions and incisional hernia and adhesions- s/p Ex Lap with adhesiolysis, small bowel resection and open repair of incisional hernia (POD #6)- post-op management per [facility].Continue Zofran and phenargan prn nausea. 3. Diarrhea- likely secondary to resolving ileus/SBO- C.diff negative. 4. Atrial fibrillation with rapid ventricular response- mild tachycardia but no recurrent Afib since admission- remains on Amiodarone perioperatively. Anticoagulation per Cardiology. Currently on  Lovenox for DVT prophylaxis. In absence of recurrent Afib and with increased risk of bleeding with cirrhosis it may be prudent to avoid long-term anticoagulation. 5. Acute Renal Failure- resolved with hydration 6. Hepatic cirrhosis- monitor LFTs with TNA. No evidence of ascites on CT. 7. Lung Cancer- no recurrence noted on CT 8. Hypothyroidism- Synthroid restarted at 1/2 dose IV- will change to 13mcg po when tolerating pos.  9. COPD- stable. Continue Spiriva and use Xopenex as needed (avoid albuterol due to AFib). 10. Protein-Calorie Malnutrition- TNA stopped infusing last pm.  Diet per [facility]- may be able to restart liquids and stay off TNA.  TNA may be contributing to nausea.   11. Disposition- per general surgery. Continue PT/OT.   LOS: 12 days   Joseph [name] 07/16/2014, 7:14 AM

## 2014-07-16 NOTE — Progress Notes (Signed)
Occupational Therapy Treatment Patient Details Name: Joseph [name] MRN: [id number] DOB: [birth year] Today's Date: 07/16/2014    History of present illness Adm 07/04/14 with abd pain; incarcerated umbilical hernia with exploratory laparotomy, small bowel resection, hernia repair 07/10/14. PMHx-lunc Ca (radiation tx), hepatic cirrhosis, AAA repair, afib, COPD   OT comments  Pt progressing. Education provided to pt and spouse.   Follow Up Recommendations  Home health OT    Equipment Recommendations  3 in 1 bedside comode    Recommendations for Other Services      Precautions / Restrictions Precautions Precautions: Fall Restrictions Weight Bearing Restrictions: No       Mobility Bed Mobility Overal bed mobility: Needs Assistance Bed Mobility: Sit to Supine Rolling: Min guard   Sit to supine: Supervision   General bed mobility comments: assist to manage lines. Cues for technique to scoot HOB.  Transfers Overall transfer level: Needs assistance Equipment used: Rolling walker (2 wheeled) Transfers: Sit to/from Stand Sit to Stand: Min guard;Min assist         General transfer comment: cues for hand placement. Min A for stand to sit to bed.        ADL Overall ADL's : Needs assistance/impaired     Grooming: Wash/dry face;Applying deodorant;Oral care;Brushing hair;Min guard;Standing   Upper Body Bathing: Min guard;Standing (washed under arms)   Lower Body Bathing: Sitting/lateral leans;Minimal assistance (washed buttocks)       Lower Body Dressing: Minimal assistance;Sitting/lateral leans (sock)   Toilet Transfer: Min guard (+2 assist for management of lines);Ambulation;RW;Minimal assistance (Min A for stand to sit transfer to bed)           Functional mobility during ADLs: Min guard (+2 assist for management of lines);Rolling walker General ADL Comments: Educated on energy conservation techniques during session. Pt took a few rest breaks. Educated on  AE for LB ADLs (and where he could purchase if wanted) and uses of reacher and pt practiced with sockaid. Educated on what pt could use to assist with washing and cleaning buttocks as he states it is difficult to reach back there. Discussed benefit of being up in chair and participating in activities.      Vision                     Perception     Praxis      Cognition  Awake/Alert Behavior During Therapy: WFL for tasks assessed/performed Overall Cognitive Status: Within Functional Limits for tasks assessed                       Extremity/Trunk Assessment               Exercises Other Exercises Other Exercises: instructed pt to be pumping ankles   Shoulder Instructions       General Comments      Pertinent Vitals/ Pain       Pain Assessment: 0-10 Pain Score: 4  Pain Location: stomach Pain Descriptors / Indicators: Aching Pain Intervention(s): Repositioned;Monitored during session   Performed deep breathing technique in session-O2 in high 80's-90's in session on 2L of O2.  Home Living                                          Prior Functioning/Environment              Frequency Min  2X/week     Progress Toward Goals  OT Goals(current goals can now be found in the care plan section)  Progress towards OT goals: Progressing toward goals  Acute Rehab OT Goals Patient Stated Goal: not stated OT Goal Formulation: With patient Time For Goal Achievement: 07/27/14 Potential to Achieve Goals: Good ADL Goals Pt Will Perform Grooming: with supervision;standing Pt Will Perform Lower Body Bathing: with supervision;sit to/from stand;with adaptive equipment Pt Will Perform Lower Body Dressing: with supervision;sit to/from stand;with adaptive equipment Pt Will Transfer to Toilet: with supervision;ambulating;bedside commode (over toilet) Pt Will Perform Toileting - Clothing Manipulation and hygiene: with supervision;sit to/from  stand Pt Will Perform Tub/Shower Transfer: Shower transfer;with supervision;ambulating;shower seat;rolling walker  Plan Discharge plan remains appropriate    Co-evaluation                 End of Session Equipment Utilized During Treatment: Gait belt;Rolling walker;Oxygen   Activity Tolerance Patient limited by fatigue   Patient Left in bed;with call bell/phone within reach;with family/visitor present   Nurse Communication          Time: 3419-6222 OT Time Calculation (min): 30 min  Charges: OT General Charges $OT Visit: 1 Procedure OT Treatments $Self Care/Home Management : 23-37 mins  Benito Mccreedy OTR/L [phone] 07/16/2014, 3:53 PM

## 2014-07-16 NOTE — Progress Notes (Signed)
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 Per wife, doing well from surgical standpoint. Tolerated a little gell-o. Less nausea.   CURRENT MEDS . antiseptic oral rinse  7 mL Mouth Rinse BID  . enoxaparin (LOVENOX) injection  40 mg Subcutaneous Q24H  . fentaNYL   Intravenous 6 times per day  . insulin aspart  0-15 Units Subcutaneous 4 times per day  . levothyroxine  88 mcg Intravenous Daily  . magnesium sulfate 1 - 4 g bolus IVPB  2 g Intravenous Once  . metoprolol  2.5 mg Intravenous 4 times per day  . potassium chloride  10 mEq Intravenous Q1 Hr x 4  . sodium chloride  10-40 mL Intracatheter Q12H  . tiotropium  18 mcg Inhalation Daily    OBJECTIVE  Filed Vitals:   07/16/14 0500 07/16/14 0523 07/16/14 0624 07/16/14 0947  BP:   91/50   Pulse:      Temp:      TempSrc:      Resp:  20 25   Height:      Weight: 202 lb 2.6 oz (91.7 kg)     SpO2:  93%  94%    Intake/Output Summary (Last 24 hours) at 07/16/14 1008 Last data filed at 07/16/14 0530  Gross per 24 hour  Intake      0 ml  Output   1350 ml  Net  -1350 ml   Filed Weights   07/08/14 0400 07/14/14 0430 07/16/14 0500  Weight: 205 lb 3.2 oz (93.078 kg) 202 lb 8 oz (91.853 kg) 202 lb 2.6 oz (91.7 kg)    PHYSICAL EXAM  General: Pleasant, NAD. Neuro: Alert and oriented X 3. Moves all extremities spontaneously. Psych: Normal affect. HEENT:  Normal  Neck: Supple without bruits or JVD.  Lungs:  Resp regular and unlabored, anterior  exam CTA.  Heart: RRR no s3, s4, or murmurs. Abdomen: Soft, abdomen open, hypoactive bowel sound. Extremities: No clubbing, cyanosis or edema. DP/PT/Radials 2+ and equal bilaterally.  Accessory Clinical Findings  CBC  Recent Labs  07/15/14 1100 07/16/14 0530  WBC 8.2 8.7  HGB 11.1* 10.5*  HCT 32.0* 30.8*  MCV 102.2* 97.5  PLT 137* 409   Basic Metabolic Panel  Recent Labs  07/14/14 0455 07/16/14 0530  NA 141 137  K 3.3* 3.7  CL 105 104  CO2 26 24  GLUCOSE 120* 105*  BUN 16 17  CREATININE 0.64 0.68  CALCIUM 7.8* 7.7*  MG  --  1.7  PHOS 2.9 2.8   Liver Function Tests  Recent Labs  07/16/14 0530  AST 32  ALT 32  ALKPHOS 157*  BILITOT 0.9  PROT 5.3*  ALBUMIN 1.8*    TELE NSR with HR 80s, no significant ventricular ectopy    ECG  No new EKG  Echocardiogram 10/12/2013  LV EF: 55% -  60%  ------------------------------------------------------------ Indications:   V728.1 Pre-op evaluation. 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 Dyspnea 786.09.  ------------------------------------------------------------ History:  PMH: Emphysema. Acquired  from the patient and from the patient's chart. Exertional dyspnea. Known aortic aneurysm. Chronic obstructive pulmonary disease. The patient has a lung malignancy and is status postchemotherapy. Risk factors: Former tobacco use. Dyslipidemia.  ------------------------------------------------------------ Study Conclusions  - Left ventricle: The cavity size was normal. Systolic function was normal. The estimated ejection fraction was in the range of 55% to 60%. Wall motion was normal; there were no regional wall motion abnormalities. There was an increased relative contribution of atrial contraction to ventricular filling. Doppler parameters are consistent with abnormal left ventricular relaxation (grade 1 diastolic dysfunction). - Aortic valve: Moderate diffuse thickening  and calcification, consistent with sclerosis. Trivial regurgitation. - Pulmonary arteries: PA peak pressure: 34mm Hg (S). Impressions:  - The right ventricular systolic pressure was increased consistent with mild pulmonary hypertension.    Radiology/Studies  Dg Chest 2 View  07/15/2014   CLINICAL DATA:  Postoperative fever. History of lung cancer and coronary artery disease. ICD10: R 50.82  EXAM: CHEST  2 VIEW  COMPARISON:  11/05/2013  FINDINGS: A left-sided Port-A-Cath which terminates the mid SVC. A right-sided PICC line terminates at the low SVC.  Normal heart size. Left hemidiaphragm elevation is moderate. No pleural fluid. 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 There is also medial right apical opacity which is favored to be due to scarring. Lower lobe predominant interstitial thickening. No lobar consolidation. Minimal volume loss adjacent the left hemidiaphragm.  IMPRESSION: COPD/chronic bronchitis with areas of scarring and atelectasis. No lobar consolidation to suggest pneumonia.   Electronically Signed   By: Abigail Miyamoto M.D.   On: 07/15/2014 14:21   Ct Chest W Contrast  07/03/2014   CLINICAL DATA:  CHEST CT ORDERED FOR RESTAGING LUNG CANCER BY DR. Julien Nordmann.  CTA OF THE ABDOMEN AND PELVIS ORDERED BY DR. Oneida Alar FOR TRIPLE A REPAIR FOLLOW-UP.  EXAM: CT CHEST WITH CONTRAST  CT ABDOMEN AND PELVIS ANGIOGRAPHY WITH AND WITHOUT CONTRAST  TECHNIQUE: Multidetector CT imaging of the chest was performed during intravenous contrast administration. Multidetector CT imaging of the abdomen and pelvis was performed following the standard protocol before and during bolus administration of intravenous contrast.  CONTRAST:  131mL OMNIPAQUE IOHEXOL 350 MG/ML SOLN  COMPARISON:  Abdominal CT 09/05/2013.  Chest CT 01/01/2014.  FINDINGS: CT CHEST FINDINGS  THORACIC INLET/BODY WALL:  Porta catheter from the left in stable position.  Atrophic or resected thyroid  gland. No supraclavicular or axillary adenopathy.  MEDIASTINUM:  No cardiomegaly or pericardial effusion. Diffuse atherosclerosis, including the coronary arteries. No acute vascular findings.  No suspicious or enlarging mediastinal lymph nodes. Previously measured right juxta diaphragmatic node is smaller at 6 mm short axis. A upper prevascular node is stable at 6 mm short axis.  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 No soft tissue increase or nodular component suggestive of recurrent disease. Stable subpleural nodule along the right minor fissure measuring 9 mm. No new pulmonary nodule.  Panlobular and centrilobular emphysema. No pneumonia, edema, effusion, or pneumothorax.  UPPER ABDOMEN:  See below  OSSEOUS:  No acute fracture.  No suspicious lytic or blastic lesions.  CT ABDOMEN AND PELVIS FINDINGS  BODY WALL: There is an incisional hernia just above the umbilicus which contains small bowel and results in a partial small bowel obstruction.  Fatty right inguinal hernia which is small.  Liver: No focal abnormality.  Biliary: No evidence of biliary obstruction or stone.  Pancreas: Unremarkable.  Spleen: Unremarkable.  Adrenals: Unremarkable.  Kidneys and ureters: No hydronephrosis or stone.  Bladder: Unremarkable.  Reproductive: Enlarged prostate, deforming the bladder base. Morphology is stable from previous.  Bowel: There is dilated proximal small bowel leading to a transition point within the above described hernia sac. Some fluid is present within the relatively decompressed distal small bowel, and the colon is not emptied, compatible with partial obstruction. No evidence of bowel devascularization/necrosis. Negative appendix. Extensive distal colonic diverticulosis without active inflammation.  Retroperitoneum: No mass or adenopathy.  Peritoneum: No ascites or pneumoperitoneum.  Vascular: Aortic branching is standard.  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  Status post bypass of an abdominal aortic aneurysm to the bilateral external iliacs and left internal iliac arteries. The graft is widely patent. Reimplanted IMA is widely patent. Branches of the left hypogastric artery are patent. The new maximal infrarenal aortic diameter measures 31 mm. The new maximal left common iliac artery diameter is 23 mm. The new maximal right common iliac artery diameter is 19 mm. These diameters include the collapsed aneurysm sac wall. The distal left high epigastric artery measures 15 mm in maximal diameter. No concerning periaortic fat infiltration or gas.  OSSEOUS: No acute abnormalities.  These results will be called to the ordering clinician or representative by the Radiologist Assistant, and communication documented in the PACS or zVision Dashboard.  IMPRESSION: 1. Incisional hernia containing small bowel. There is related partial small bowel obstruction. 2. No evidence of recurrent lung cancer. 3. Status post aortic and iliac aneurysm bypass. New baseline anatomy is described above.   Electronically Signed   By: Jorje Guild M.D.   On: 07/03/2014 14:18   US Abdomen Complete  06/19/2014   CLINICAL DATA:  Upper abdominal pain.  Cirrhosis of the liver.  EXAM: ULTRASOUND ABDOMEN COMPLETE  COMPARISON:  09/05/2013  FINDINGS: Gallbladder: No gallstones or wall thickening visualized. No sonographic Murphy sign noted.  Common bile duct: Diameter: Normal caliber, 4 mm  Liver: Coarsened echotexture with nodular contours compatible with cirrhosis. No focal abnormality.  IVC: No abnormality visualized.  Pancreas: Visualized portion unremarkable.  Spleen: Size and appearance within normal limits.  Right Kidney: Length: 11.8 cm. Echogenicity within normal limits. No mass or hydronephrosis visualized.  Left Kidney: Length: 12.4 cm. Echogenicity within normal limits. No mass or hydronephrosis  visualized.  Abdominal aorta: No aneurysm visualized.  Other findings: None.  IMPRESSION: Changes of cirrhosis. No focal hepatic abnormality. No acute findings.   Electronically Signed   By: Rolm Baptise M.D.   On: 06/19/2014 08:25   Dg Abd 2 Views  07/09/2014   CLINICAL DATA:  Abdominal pain and distension with small bowel obstruction demonstrated on earlier studies  EXAM: ABDOMEN - 2 VIEW  COMPARISON:  Abdominal film of July 07, 2014  FINDINGS: There remain loops of moderately distended gas-filled small bowel throughout the abdomen. The volume of gas has decreased slightly. The esophagogastric tube tip and proximal port lie in the region of the gastric cardia. There is some stool and gas in the rectosigmoid. No free extraluminal gas collections are demonstrated.  IMPRESSION: Persistent small bowel obstruction. There may have been slight interval decrease in the volume of gas present within distended small bowel loops.   Electronically Signed   By: David  Martinique   On: 07/09/2014 09:00   Dg Abd 2 Views  07/07/2014   CLINICAL DATA:  Small bowel obstruction.  EXAM: ABDOMEN -  2 VIEW  COMPARISON:  Abdominal CT from 4 days prior  FINDINGS: Dilated bowel in the central abdomen which has progressed in diameter since 07/03/2014. No definite dilatation of colon. Gastric suction tube terminates in the proximal stomach. No evidence for perforation. No new concerning mass effect.  IMPRESSION: 1. Small bowel obstruction which has progressed since comparison 07/03/2014. 2. Orogastric tube in good position   Electronically Signed   By: Jorje Guild M.D.   On: 07/07/2014 09:24   Ct Angio Abd/pel W/ And/or W/o  07/03/2014   CLINICAL DATA:  CHEST CT ORDERED FOR RESTAGING LUNG CANCER BY DR. Julien Nordmann.  CTA OF THE ABDOMEN AND PELVIS ORDERED BY DR. Oneida Alar FOR TRIPLE A REPAIR FOLLOW-UP.  EXAM: CT CHEST WITH CONTRAST  CT ABDOMEN AND PELVIS ANGIOGRAPHY WITH AND WITHOUT CONTRAST  TECHNIQUE: Multidetector CT imaging of the chest  was performed during intravenous contrast administration. Multidetector CT imaging of the abdomen and pelvis was performed following the standard protocol before and during bolus administration of intravenous contrast.  CONTRAST:  181mL OMNIPAQUE IOHEXOL 350 MG/ML SOLN  COMPARISON:  Abdominal CT 09/05/2013.  Chest CT 01/01/2014.  FINDINGS: CT CHEST FINDINGS  THORACIC INLET/BODY WALL:  Porta catheter from the left in stable position.  Atrophic or resected thyroid gland. No supraclavicular or axillary adenopathy.  MEDIASTINUM:  No cardiomegaly or pericardial effusion. Diffuse atherosclerosis, including the coronary arteries. No acute vascular findings.  No suspicious or enlarging mediastinal lymph nodes. Previously measured right juxta diaphragmatic node is smaller at 6 mm short axis. A upper prevascular node is stable at 6 mm short axis.  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 No soft tissue increase or nodular component suggestive of recurrent disease. Stable subpleural nodule along the right minor fissure measuring 9 mm. No new pulmonary nodule.  Panlobular and centrilobular emphysema. No pneumonia, edema, effusion, or pneumothorax.  UPPER ABDOMEN:  See below  OSSEOUS:  No acute fracture.  No suspicious lytic or blastic lesions.  CT ABDOMEN AND PELVIS FINDINGS  BODY WALL: There is an incisional hernia just above the umbilicus which contains small bowel and results in a partial small bowel obstruction.  Fatty right inguinal hernia which is small.  Liver: No focal abnormality.  Biliary: No evidence of biliary obstruction or stone.  Pancreas: Unremarkable.  Spleen: Unremarkable.  Adrenals: Unremarkable.  Kidneys and ureters: No hydronephrosis or stone.  Bladder: Unremarkable.  Reproductive: Enlarged prostate, deforming the bladder base. Morphology is stable from previous.  Bowel: There is dilated proximal small bowel leading to  a transition point within the above described hernia sac. Some fluid is present within the relatively decompressed distal small bowel, and the colon is not emptied, compatible with partial obstruction. No evidence of bowel devascularization/necrosis. Negative appendix. Extensive distal colonic diverticulosis without active inflammation.  Retroperitoneum: No mass or adenopathy.  Peritoneum: No ascites or pneumoperitoneum.  Vascular: Aortic branching is standard. 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  Status post bypass of an abdominal aortic aneurysm to the bilateral external iliacs and left internal iliac arteries. The graft is widely patent. Reimplanted IMA is widely patent. Branches of the left hypogastric artery are patent. The new maximal infrarenal aortic diameter measures 31 mm. The new maximal left common iliac artery diameter is 23 mm. The new maximal right common iliac artery diameter is 19 mm. These diameters include the collapsed aneurysm sac wall. The  distal left high epigastric artery measures 15 mm in maximal diameter. No concerning periaortic fat infiltration or gas.  OSSEOUS: No acute abnormalities.  These results will be called to the ordering clinician or representative by the Radiologist Assistant, and communication documented in the PACS or zVision Dashboard.  IMPRESSION: 1. Incisional hernia containing small bowel. There is related partial small bowel obstruction. 2. No evidence of recurrent lung cancer. 3. Status post aortic and iliac aneurysm bypass. New baseline anatomy is described above.   Electronically Signed   By: Jorje Guild M.D.   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 A-fib with RVR  - currently on IV metoprolol and amiodarone. Less nausea today, bowel sound still hypoactive. Likely can transition to PO med soon  - not a good anticoagulation candidate with bleeding risk and h/o cirrhosis  - hopefully once acute issue resolved, a-fib will not recur  - per Dr. Rayann Heman, will change to PO amiodarone 200mg  daily for 4 wks once can tolerate PO food. Will also change IV metoprolol to 12.5mg  BID metoprolol as well.   2. Incarcirated ventral hernia s/p ex lap  - wound vac placed, abdomen has not been close yet. However per surgery, stable at this time.  3. Hypothyroidism: started on synthroid  Weston Brass Woodward Ku Pager: [phone] Agree with above assessment.  His nausea is improving.  Perhaps tomorrow we can switch him to oral medication.  He is maintaining normal sinus rhythm on IV amiodarone and IV metoprolol.  No chest pain or dyspnea.

## 2014-07-16 NOTE — Progress Notes (Signed)
TNA not infusing properly (partial occlusion/occlusion) IV team aware and assessed.Marland KitchenMarland KitchenTNA discontinued and D10 hung per protocol. Will continue to monitor! Jessie Foot, RN

## 2014-07-16 NOTE — Progress Notes (Signed)
Physical Therapy Treatment Patient Details Name: Joseph [name] MRN: [id number] DOB: September 13, 1940 Today's Date: 07/16/2014    History of Present Illness Adm 07/04/14 with abd pain; incarcerated umbilical hernia with exploratory laparotomy, small bowel resection, hernia repair 07/10/14. PMHx-lunc Ca (radiation tx), hepatic cirrhosis, AAA repair, afib, COPD    PT Comments    Pt currently with functional limitations due to decreased endurance, strength, functional mobility, balance and increased pain. Patient will benefit from skilled PT to increase independence and safety with mobility to allow discharge to home with HHPT and 24 hour assistance. Pt's SaO2 dropped to 82% when taken off 2L supplemental O2 prior to moving. Pt was put back on 3L supplemental O2 to ambulate in hall after this. PT's had difficulty maintaining SaO2 when ambulating but pt did not complain of any SOB or dizziness. Pt's SaO2 increased to normal range with rest after ambulation. Pt limited by fatigue with all movement and reported increased discomfort from wound care session earlier in the day. Pt was left sitting up in chair with wife in room in order to participate in OT session in the next 30 minutes.     Follow Up Recommendations  Home health PT;Supervision/Assistance - 24 hour     Equipment Recommendations  Rolling walker with 5" wheels;3in1 (PT)    Recommendations for Other Services       Precautions / Restrictions Precautions Precautions: Fall Restrictions Weight Bearing Restrictions: No    Mobility  Bed Mobility Overal bed mobility: Needs Assistance Bed Mobility: Supine to Sit;Rolling Rolling: Min guard   Supine to sit: Min guard;+2 for safety/equipment;HOB elevated     General bed mobility comments: Pt required min assist for supine to sit, for management of lines and leads   Transfers Overall transfer level: Needs assistance Equipment used: Rolling walker (2 wheeled) Transfers: Sit  to/from Stand Sit to Stand: Min assist;+2 safety/equipment;Min guard         General transfer comment: Pt required min assist for safety with transfer to stand.   Ambulation/Gait Ambulation/Gait assistance: Min guard;Min assist;+2 safety/equipment Ambulation Distance (Feet): 60 Feet Assistive device: Rolling walker (2 wheeled) Gait Pattern/deviations: Step-through pattern;Decreased stride length   Gait velocity interpretation: Below normal speed for age/gender General Gait Details: Pt did well with ambulation but required min assist for safety.    Stairs            Wheelchair Mobility    Modified Rankin (Stroke Patients Only)       Balance Overall balance assessment: Needs assistance Sitting-balance support: Feet supported;No upper extremity supported Sitting balance-Leahy Scale: Fair Sitting balance - Comments: Pt sat EOB while PTs adjusted lines and leads.    Standing balance support: During functional activity;Bilateral upper extremity supported Standing balance-Leahy Scale: Poor Standing balance comment: Pt relied on RW to maintain balance during standing.                     Cognition Arousal/Alertness: Awake/alert Behavior During Therapy: WFL for tasks assessed/performed Overall Cognitive Status: Within Functional Limits for tasks assessed                      Exercises      General Comments General comments (skin integrity, edema, etc.): PT had difficulty maintaining adequate SaO2 throughout PT session despite patient being on 3-4 L supplemental O2.       Pertinent Vitals/Pain  No reported pain levels received from Pt however he did push his PCA multiple times  during today's session.  Pt desat to 82% on RA prior to moving so Pt resumed supplemental O2 for ambulation.  Pt had to have max encouragement to perform pursed lip breathing throughout session and sats between 85-90% with ambulation with 3-4LO2.   Pt's SaO2 returned to normal  levels >92% after ambulation with pt at rest.  Encouraged incentive spirometer.      Home Living                      Prior Function            PT Goals (current goals can now be found in the care plan section) Progress towards PT goals: Progressing toward goals    Frequency  Min 3X/week    PT Plan Current plan remains appropriate    Co-evaluation             End of Session Equipment Utilized During Treatment: Gait belt;Oxygen Activity Tolerance: Patient limited by fatigue Patient left: in chair;with call bell/phone within reach;with family/visitor present     Time: 3474-2595 PT Time Calculation (min) (ACUTE ONLY): 29 min  Charges:  $Gait Training: 8-22 mins $Therapeutic Activity: 8-22 mins                    G CodesJearld Shines, SPT 07/16/2014, 3:16 PM   Jearld Shines, SPT  Acute Rehabilitation [phone] [facility] Acute Rehabilitation [phone] (pager)

## 2014-07-16 NOTE — Progress Notes (Signed)
6 Days Post-Op  Subjective: Patient just had a VAC change Diarrhea has slowed down No pain when he is supine and still.  Soreness with movement.  Objective: Vital signs in last 24 hours: Temp:  [97.6 F (36.4 C)-100 F (37.8 C)] 99.3 F (37.4 C) (11/16 0320) Resp:  [12-27] 25 (11/16 0624) BP: (91-112)/(50-73) 91/50 mmHg (11/16 0624) SpO2:  [92 %-96 %] 93 % (11/16 0523) Weight:  [202 lb 2.6 oz (91.7 kg)] 202 lb 2.6 oz (91.7 kg) (11/16 0500) Last BM Date: 07/15/14  Intake/Output from previous day: 11/15 0701 - 11/16 0700 In: 0  Out: 1740 [Urine:1740] Intake/Output this shift:    General appearance: alert, cooperative and no distress Resp: clear to auscultation bilaterally Cardio: regular rate and rhythm, S1, S2 normal, no murmur, click, rub or gallop GI: soft, minimally tender; + bowel sounds VAC with good seal  Lab Results:   Recent Labs  07/15/14 1100 07/16/14 0530  WBC 8.2 8.7  HGB 11.1* 10.5*  HCT 32.0* 30.8*  PLT 137* 173   BMET  Recent Labs  07/14/14 0455 07/16/14 0530  NA 141 137  K 3.3* 3.7  CL 105 104  CO2 26 24  GLUCOSE 120* 105*  BUN 16 17  CREATININE 0.64 0.68  CALCIUM 7.8* 7.7*   PT/INR No results for input(s): LABPROT, INR in the last 72 hours. ABG No results for input(s): PHART, HCO3 in the last 72 hours.  Invalid input(s): PCO2, PO2  Studies/Results: Dg Chest 2 View  07/15/2014   CLINICAL DATA:  Postoperative fever. History of lung cancer and coronary artery disease. ICD10: R 50.82  EXAM: CHEST  2 VIEW  COMPARISON:  11/05/2013  FINDINGS: A left-sided Port-A-Cath which terminates the mid SVC. A right-sided PICC line terminates at the low SVC.  Normal heart size. Left hemidiaphragm elevation is moderate. No pleural fluid. 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 There is also medial right apical opacity which is favored to be due to scarring. Lower lobe predominant interstitial thickening. No  lobar consolidation. Minimal volume loss adjacent the left hemidiaphragm.  IMPRESSION: COPD/chronic bronchitis with areas of scarring and atelectasis. No lobar consolidation to suggest pneumonia.   Electronically Signed   By: Abigail Miyamoto M.D.   On: 07/15/2014 14:21    Anti-infectives: Anti-infectives    Start     Dose/Rate Route Frequency Ordered Stop   07/10/14 1500  cefoTEtan (CEFOTAN) 2 g in dextrose 5 % 50 mL IVPB     2 g100 mL/hr over 30 Minutes Intravenous Every 12 hours 07/10/14 1445 07/11/14 2245   07/10/14 0600  cefoTEtan (CEFOTAN) 1 g in dextrose 5 % 50 mL IVPB  Status:  Discontinued     1 g100 mL/hr over 30 Minutes Intravenous On call to O.R. 07/09/14 1013 07/09/14 1539   07/10/14 0600  [MAR Hold]  cefoTEtan (CEFOTAN) 2 g in dextrose 5 % 50 mL IVPB     (MAR Hold since 07/10/14 0718)   2 g100 mL/hr over 30 Minutes Intravenous On call to O.R. 07/09/14 1539 07/10/14 0825   07/09/14 0930  cefoTEtan (CEFOTAN) 2 g in dextrose 5 % 50 mL IVPB  Status:  Discontinued     2 g100 mL/hr over 30 Minutes Intravenous On call to O.R. 07/09/14 0923 07/09/14 1013      Assessment/Plan: s/p Procedure(s): EXPLORATORY LAPAROTOMY (N/A) LYSIS OF ADHESION - 90 minutes (N/A) SMALL BOWEL RESECTION (N/A) HERNIA REPAIR INCISIONAL (N/A) Diarrhea and  bloating seems to be improved  C diff negative Will begin clears and advance as tolerated.    LOS: 12 days    Gasper Hopes K. 07/16/2014

## 2014-07-16 NOTE — Progress Notes (Signed)
PARENTERAL NUTRITION CONSULT NOTE - Follow-up  Pharmacy Consult for TPN Indication: Prolonged ileus  No Known Allergies  Patient Measurements: Height: 6' (182.9 cm) Weight: 202 lb 2.6 oz (91.7 kg) IBW/kg (Calculated) : 77.6 Usual Weight: 93.1 kg  Vital Signs: Temp: 99.3 F (37.4 C) (11/16 0320) Temp Source: Oral (11/15 2330) BP: 91/50 mmHg (11/16 0624) Intake/Output from previous day: 11/15 0701 - 11/16 0700 In: 0  Out: 1740 [Urine:1740] Intake/Output from this shift:    Labs:  Recent Labs  07/15/14 1100 07/16/14 0530  WBC 8.2 8.7  HGB 11.1* 10.5*  HCT 32.0* 30.8*  PLT 137* 173     Recent Labs  07/14/14 0455 07/16/14 0530  NA 141 137  K 3.3* 3.7  CL 105 104  CO2 26 24  GLUCOSE 120* 105*  BUN 16 17  CREATININE 0.64 0.68  CALCIUM 7.8* 7.7*  MG  --  1.7  PHOS 2.9 2.8  PROT  --  5.3*  ALBUMIN  --  1.8*  AST  --  32  ALT  --  32  ALKPHOS  --  157*  BILITOT  --  0.9   Estimated Creatinine Clearance: 93 mL/min (by C-G formula based on Cr of 0.68).    Recent Labs  07/15/14 1824 07/16/14 0010 07/16/14 0756  GLUCAP 120* 109* 107*    Insulin Requirements in the past 12 hours:  0 units Novolog SSI  Nutritional Goals:  2100-2300 kCal, 115-130 grams of protein per day (per RD recs 11/11)  Current Nutrition: Clinimix E 5/15 at 178m/hr and 20% lipids at 183mhr---bag clotted at ~0530 this morning and D10 was started at 10055mr- spoke with RN Josh at ~[phone] had him turn it down to 44m61m.  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 11/10: S/p ex lap with LOA and SBR for SBO. Pharmacy consulted to manage TPN due to not eating 5-6 days prior to surgery, now with ileus.  GI: Patient with ileus after 11/10 ex lap with LOA and SBR for SBO. Vac to abdominal wound. + stool 11/14, 11/15. Diet of clears changed back to NPO PM 11/14 after pt had N/V and diarrhea. TPN line occluded last night  and he was started on D10 at same rate as per orders. Spoke with Dr. TsueGeorgette Doverd he is beginning to advance his diet- no need to resume TPN tonight. pre-albumin 6.6 (11/13)  Endo: Hypothyroid (TSH 0.78), IV Synthroid (1/2 home dose). No h/o DM. Empiric SSI started with TPN initiation. CBGs 90-125  Lytes: K 3.7 (goal >4), Phos 2.8, Mag 1.7 (goal >2)  Renal: Scr stable.   Pulm: COPD/emphysema. 93/2L - Spiriva  Cards: : h/o AAA, HLD, CAD. New afib. VSS. Pt now in NSR. IV metoprolol, IV Amio  Hepatobil: h/o cirrhosis. AST/ALT/Tbili wnl, alk phos elevated at 157, alb 1.8, TG 138 (11/13)  Neuro: Fent PCA for pain  ID: Tmax 100/24h, WBC wnl. No abx.   Heme/Onc: h/o adenomatous colon polyps, lung cancer. Hgb down a bit to 10.5, Plts 173 (stable)  Best Practices: Lovenox 40mg62mTPN Access: Double lumen PICC 11/11  TPN day# 4 (11/11 >> current)  Plan:  1. Replace potassium with KCl 10mEq29ms x4 2. Replace magnesium with magsulfate 2g IV x1 3. Continue D10 infusion at 44mL/h51mr 6 more hours (stop time 1530) 4. NO new TPN bag tonight as per discussion with Dr. Tsuei 5Georgette Dover labs and associated RN orders d/c'd 6.  Pharmacy to sign off from TPN- please reconsult if needed  Lauren D. Bajbus, PharmD, BCPS Clinical Pharmacist Pager: [phone] 07/16/2014 9:39 AM

## 2014-07-17 LAB — URINE CULTURE
Colony Count: NO GROWTH
Culture: NO GROWTH

## 2014-07-17 LAB — GLUCOSE, CAPILLARY
GLUCOSE-CAPILLARY: 118 mg/dL — AB (ref 70–99)
Glucose-Capillary: 79 mg/dL (ref 70–99)
Glucose-Capillary: 104 mg/dL — ABNORMAL HIGH (ref 70–99)
Glucose-Capillary: 102 mg/dL — ABNORMAL HIGH (ref 70–99)
Glucose-Capillary: 123 mg/dL — ABNORMAL HIGH (ref 70–99)

## 2014-07-17 MED ORDER — OXYCODONE-ACETAMINOPHEN 5-325 MG PO TABS
1.0000 | ORAL_TABLET | ORAL | Status: DC | PRN
Start: 2014-07-17 — End: 2014-07-19
  Administered 2014-07-17 – 2014-07-19 (×5): 1 via ORAL
  Filled 2014-07-17 (×5): qty 1

## 2014-07-17 MED ORDER — AMIODARONE HCL 200 MG PO TABS
200.0000 mg | ORAL_TABLET | Freq: Every day | ORAL | Status: DC
  Administered 2014-07-17 – 2014-07-19 (×3): 200 mg via ORAL
  Filled 2014-07-17 (×3): qty 1

## 2014-07-17 MED ORDER — ENSURE COMPLETE PO LIQD
237.0000 mL | Freq: Three times a day (TID) | ORAL | Status: DC
  Administered 2014-07-17 – 2014-07-18 (×5): 237 mL via ORAL

## 2014-07-17 MED ORDER — METOPROLOL TARTRATE 12.5 MG HALF TABLET
12.5000 mg | ORAL_TABLET | Freq: Two times a day (BID) | ORAL | Status: DC
  Administered 2014-07-17 – 2014-07-19 (×5): 12.5 mg via ORAL
  Filled 2014-07-17 (×5): qty 1

## 2014-07-17 MED ORDER — DEXTROSE 10 % IV SOLN
INTRAVENOUS | Status: DC
  Administered 2014-07-16: 50 mL/h via INTRAVENOUS

## 2014-07-17 MED ORDER — FENTANYL CITRATE 0.05 MG/ML IJ SOLN: 25.0000 ug | INTRAMUSCULAR | Status: DC | PRN

## 2014-07-17 MED ORDER — AMIODARONE HCL IN DEXTROSE 360-4.14 MG/200ML-% IV SOLN
INTRAVENOUS | Status: AC
  Administered 2014-07-17: 200 mL
  Filled 2014-07-17: qty 200

## 2014-07-17 MED ORDER — LEVOTHYROXINE SODIUM 75 MCG PO TABS
150.0000 ug | ORAL_TABLET | Freq: Every day | ORAL | Status: DC
  Administered 2014-07-17 – 2014-07-19 (×3): 150 ug via ORAL
  Filled 2014-07-17 (×4): qty 2

## 2014-07-17 NOTE — Progress Notes (Signed)
Taking Po Seems to be turning the corner [place], MD [facility] Office: [phone] Pager: [phone]

## 2014-07-17 NOTE — Progress Notes (Signed)
Subjective: No SOB or dizziness  Objective: Vital signs in last 24 hours: Temp:  [98.5 F (36.9 C)-100 F (37.8 C)] 98.6 F (37 C) (11/17 0731) Resp:  [12-25] 19 (11/17 0843) BP: (94-115)/(55-63) 105/60 mmHg (11/17 0843) SpO2:  [92 %-95 %] 94 % (11/17 0905) Weight:  [208 lb (94.348 kg)] 208 lb (94.348 kg) (11/17 0400) Last BM Date: 07/16/14  Intake/Output from previous day: 11/16 0701 - 11/17 0700 In: 450.3 [I.V.:450.3] Out: 1200 [Urine:1200] Intake/Output this shift:    Medications Current Facility-Administered Medications  Medication Dose Route Frequency Provider Last Rate Last Dose  . acetaminophen (TYLENOL) tablet 650 mg  650 mg Oral Q4H PRN Donnie Mesa, MD   650 mg at 07/14/14 2030  . amiodarone (NEXTERONE PREMIX) 360 MG/200ML (1.8 mg/mL) IV infusion  30 mg/hr Intravenous Continuous Josue Hector, MD 16.7 mL/hr at 07/17/14 0025 30 mg/hr at 07/17/14 0025  . antiseptic oral rinse (CPC / CETYLPYRIDINIUM CHLORIDE 0.05%) solution 7 mL  7 mL Mouth Rinse BID [name], MD   7 mL at 07/16/14 2225  . dextrose 10 % infusion   Intravenous Continuous Ralene Ok, MD 50 mL/hr at 07/16/14 2200 50 mL/hr at 07/16/14 2200  . dextrose 5 % and 0.45 % NaCl with KCl 10 mEq/L infusion   Intravenous Continuous Rolla Flatten, RPH      . enoxaparin (LOVENOX) injection 40 mg  40 mg Subcutaneous Q24H [name], MD   40 mg at 07/16/14 1129  . fentaNYL (SUBLIMAZE) injection 25-50 mcg  25-50 mcg Intravenous Q3H PRN Saverio Danker, [name]      . insulin aspart (novoLOG) injection 0-15 Units  0-15 Units Subcutaneous 4 times per day Wayland Salinas, [facility]   2 Units at 07/14/14 1308  . levalbuterol (XOPENEX) nebulizer solution 0.63 mg  0.63 mg Nebulization Q6H PRN [name], MD      . levothyroxine (SYNTHROID, LEVOTHROID) tablet 150 mcg  150 mcg Oral QAC breakfast [name], MD   150 mcg at 07/17/14 0742  . metoprolol (LOPRESSOR) injection 2.5 mg  2.5 mg Intravenous 4  times per day Rocky Crafts Hammons, RPH   2.5 mg at 07/16/14 1831  . oxyCODONE-acetaminophen (PERCOCET/ROXICET) 5-325 MG per tablet 1-2 tablet  1-2 tablet Oral Q4H PRN Saverio Danker, [name]      . promethazine (PHENERGAN) injection 12.5 mg  12.5 mg Intravenous Q4H PRN Donnie Mesa, MD   12.5 mg at 07/14/14 2242  . sodium chloride 0.9 % injection 10-40 mL  10-40 mL Intracatheter Q12H [name], MD   10 mL at 07/16/14 2226  . sodium chloride 0.9 % injection 10-40 mL  10-40 mL Intracatheter PRN [name], MD   10 mL at 07/16/14 0458  . tiotropium (SPIRIVA) inhalation capsule 18 mcg  18 mcg Inhalation Daily Velna Hatchet, MD   18 mcg at 07/17/14 3557  . zolpidem (AMBIEN) tablet 5 mg  5 mg Oral QHS PRN Sheela Stack, MD   5 mg at 07/08/14 2159    PE: General appearance: alert, cooperative and no distress Lungs: clear to auscultation bilaterally Heart: regular rate and rhythm, S1, S2 normal, no murmur, click, rub or gallop Abdomen: +BS Extremities: No LEE Pulses: 2+ and symmetric Skin: Warm and dry Neurologic: Grossly normal  Lab Results:   Recent Labs  07/15/14 1100 07/16/14 0530  WBC 8.2 8.7  HGB 11.1* 10.5*  HCT 32.0* 30.8*  PLT 137* 173   BMET  Recent Labs  07/16/14 0530  NA 137  K 3.7  CL 104  CO2 24  GLUCOSE 105*  BUN 17  CREATININE 0.68  CALCIUM 7.7*   PT/INR No results for input(s): LABPROT, INR in the last 72 hours. Cholesterol No results for input(s): CHOL in the last 72 hours. Cardiac Enzymes Invalid input(s): TROPONIN,  CKMB  Studies/Results: @RISRSLT2 @   Assessment/Plan  1. A-fib with RVR Maintaining NSR. Currently on IV metoprolol and amiodarone.  Changing to PO amiodarone 200mg  daily for 4 wks. and Lopressor 12.5mg  BID  not a good anticoagulation candidate with bleeding risk and h/o cirrhosis  2. Incarcirated ventral hernia s/p ex lap - wound vac placed, abdomen has not been close yet. However  per surgery, stable at this time.   3. Hypothyroidism: started on synthroid   LOS: 13 days    Joseph [name], Joseph [name] 07/17/2014 10:53 AM  Patient seen.  He is feeling stronger.  Today he will be starting on oral alimentation and will be switched to oral medications. No chest pain or dizziness or shortness of breath.  Rhythm remains normal sinus rhythm.the lungs are clear without wheezing or rales.  The heart reveals no gallop. Agree with assessment and plan above.

## 2014-07-17 NOTE — Progress Notes (Signed)
Subjective: Had a much better day- tolerating liquids.  No BM but lots of flatus.  Mild left upper quadrant abdominal pain.  No Nausea/vomitting.  Ambulated with walker.  Feels like he is getting stronger.  Objective: Vital signs in last 24 hours: Temp:  [98.5 F (36.9 C)-100 F (37.8 C)] 98.5 F (36.9 C) (11/17 0400) Resp:  [12-25] 15 (11/17 0400) BP: (94-115)/(55-63) 94/57 mmHg (11/17 0230) SpO2:  [92 %-95 %] 92 % (11/17 0400) Weight:  [94.348 kg (208 lb)] 94.348 kg (208 lb) (11/17 0400) Weight change: 2.648 kg (5 lb 13.4 oz) Last BM Date: 07/16/14  CBG (last 3)   Recent Labs  07/16/14 2207 07/16/14 2339 07/17/14 0605  GLUCAP 79 76 104*    Intake/Output from previous day: 11/16 0701 - 11/17 0700 In: 450.3 [I.V.:450.3] Out: 1200 [Urine:1200] Intake/Output this shift:    General appearance: alert and appears stronger Eyes: no scleral icterus Throat: oropharynx moist without erythema Resp: clear to auscultation bilaterally Cardio: regular rate and rhythm GI: soft, non-distended with increased bowel sounds; mild left sided tenderness; VAC in place Extremities: no clubbing, cyanosis or edema; SCDs   Lab Results:  Recent Labs  07/16/14 0530  NA 137  K 3.7  CL 104  CO2 24  GLUCOSE 105*  BUN 17  CREATININE 0.68  CALCIUM 7.7*  MG 1.7  PHOS 2.8    Recent Labs  07/16/14 0530  AST 32  ALT 32  ALKPHOS 157*  BILITOT 0.9  PROT 5.3*  ALBUMIN 1.8*    Recent Labs  07/15/14 1100 07/16/14 0530  WBC 8.2 8.7  HGB 11.1* 10.5*  HCT 32.0* 30.8*  MCV 102.2* 97.5  PLT 137* 173   Lab Results  Component Value Date   INR 1.29 11/01/2013   INR 1.37 11/01/2013   INR 1.32 10/31/2013   No results for input(s): CKTOTAL, CKMB, CKMBINDEX, TROPONINI in the last 72 hours. No results for input(s): TSH, T4TOTAL, T3FREE, THYROIDAB in the last 72 hours.  Invalid input(s): FREET3 No results for input(s): VITAMINB12, FOLATE, FERRITIN, TIBC, IRON, RETICCTPCT in the last  72 hours.  Studies/Results: Dg Chest 2 View  07/15/2014   CLINICAL DATA:  Postoperative fever. History of lung cancer and coronary artery disease. ICD10: R 50.82  EXAM: CHEST  2 VIEW  COMPARISON:  11/05/2013  FINDINGS: A left-sided Port-A-Cath which terminates the mid SVC. A right-sided PICC line terminates at the low SVC.  Normal heart size. Left hemidiaphragm elevation is moderate. No pleural fluid. 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 There is also medial right apical opacity which is favored to be due to scarring. Lower lobe predominant interstitial thickening. No lobar consolidation. Minimal volume loss adjacent the left hemidiaphragm.  IMPRESSION: COPD/chronic bronchitis with areas of scarring and atelectasis. No lobar consolidation to suggest pneumonia.   Electronically Signed   By: Abigail Miyamoto M.D.   On: 07/15/2014 14:21     Medications: Scheduled: . antiseptic oral rinse  7 mL Mouth Rinse BID  . enoxaparin (LOVENOX) injection  40 mg Subcutaneous Q24H  . fentaNYL   Intravenous 6 times per day  . insulin aspart  0-15 Units Subcutaneous 4 times per day  . levothyroxine  88 mcg Intravenous Daily  . metoprolol  2.5 mg Intravenous 4 times per day  . sodium chloride  10-40 mL Intracatheter Q12H  . tiotropium  18 mcg Inhalation Daily   Continuous: . amiodarone 30 mg/hr (07/17/14 0025)  .  dextrose 50 mL/hr (07/16/14 2200)  . dextrose 5 % and 0.45 % NaCl with KCl 10 mEq/L      Assessment/Plan: 1. Postoperative Fever- Resolved. CXR clear, UA negative. Suspect atelectasis.  2. Small bowel obstruction secondary to adhesions and incisional hernia and adhesions- s/p Ex Lap with adhesiolysis, small bowel resection and open repair of incisional hernia (POD #7)- post-op management per [facility].Continue Zofran and phenargan prn nausea. 3. Diarrhea- resolved-likely secondary to resolving ileus/SBO- C.diff negative. 4. Atrial fibrillation with rapid  ventricular response- No recurrent Afib since admission- remains on Amiodarone perioperatively. Anticoagulation per Cardiology. Currently on Lovenox for DVT prophylaxis. In absence of recurrent Afib and with increased risk of bleeding with cirrhosis it may be prudent to avoid long-term anticoagulation.   5. Acute Renal Failure- resolved with hydration 6. Hepatic cirrhosis- monitor LFTs with TNA. No evidence of ascites on CT. 7. Lung Cancer- no recurrence noted on CT 8. Hypothyroidism- Synthroid restarted at 1/2 dose IV- will change to home po dose 118mcg daily 9. COPD- stable. Continue Spiriva and use Xopenex as needed (avoid albuterol due to AFib).Wean O2 as tolerated. 10. Protein-Calorie Malnutrition- TNA discontinued. Diet per [facility]- tolerating liquids 11. Disposition- per general surgery. Continue PT/OT.  Anticipate he will be OK for discharge to home when stable.   LOS: 13 days   Joseph [name] 07/17/2014, 7:07 AM

## 2014-07-17 NOTE — Progress Notes (Addendum)
On call MD for [facility] notified of pt's CBG's in 48"s. Pt asymptomatic, on clear liquid diet and taking in very little,jello only, D10 at 50cc/hr. NNO at this time. Pt currently has D10 running at 50cc/hr and per MD to continue at that same rate at this time. Zhamir Pirro, [place], [place]

## 2014-07-17 NOTE — Progress Notes (Signed)
Patient ID: Joseph [name], male   DOB: [birth year], 73 y.o.   MRN: [id number] 7 Days Post-Op  Subjective: Pt feels well this morning.  No further diarrhea.  Passing lots of flatus.  No nausea.  Tolerating clear liquids  Objective: Vital signs in last 24 hours: Temp:  [98.5 F (36.9 C)-100 F (37.8 C)] 98.6 F (37 C) (11/17 0731) Resp:  [12-25] 18 (11/17 0744) BP: (94-115)/(55-63) 96/57 mmHg (11/17 0732) SpO2:  [92 %-95 %] 92 % (11/17 0744) Weight:  [208 lb (94.348 kg)] 208 lb (94.348 kg) (11/17 0400) Last BM Date: 07/16/14  Intake/Output from previous day: 11/16 0701 - 11/17 0700 In: 450.3 [I.V.:450.3] Out: 1200 [Urine:1200] Intake/Output this shift:    PE: Abd: soft, appropriately tender, ND, VAC in place, +BS  Lab Results:   Recent Labs  07/15/14 1100 07/16/14 0530  WBC 8.2 8.7  HGB 11.1* 10.5*  HCT 32.0* 30.8*  PLT 137* 173   BMET  Recent Labs  07/16/14 0530  NA 137  K 3.7  CL 104  CO2 24  GLUCOSE 105*  BUN 17  CREATININE 0.68  CALCIUM 7.7*   PT/INR No results for input(s): LABPROT, INR in the last 72 hours. CMP     Component Value Date/Time   NA 137 07/16/2014 0530   NA 143 07/03/2014 1024   K 3.7 07/16/2014 0530   K 3.9 07/03/2014 1024   CL 104 07/16/2014 0530   CL 109* 08/01/2012 1046   CO2 24 07/16/2014 0530   CO2 25 07/03/2014 1024   GLUCOSE 105* 07/16/2014 0530   GLUCOSE 96 07/03/2014 1024   GLUCOSE 93 08/01/2012 1046   BUN 17 07/16/2014 0530   BUN 19.4 07/03/2014 1024   CREATININE 0.68 07/16/2014 0530   CREATININE 1.0 07/03/2014 1024   CREATININE 1.00 08/17/2013 1250   CALCIUM 7.7* 07/16/2014 0530   CALCIUM 9.3 07/03/2014 1024   PROT 5.3* 07/16/2014 0530   PROT 7.2 07/03/2014 1024   ALBUMIN 1.8* 07/16/2014 0530   ALBUMIN 3.4* 07/03/2014 1024   AST 32 07/16/2014 0530   AST 29 07/03/2014 1024   ALT 32 07/16/2014 0530   ALT 25 07/03/2014 1024   ALKPHOS 157* 07/16/2014 0530   ALKPHOS 145 07/03/2014 1024   BILITOT 0.9  07/16/2014 0530   BILITOT 1.33* 07/03/2014 1024   GFRNONAA >90 07/16/2014 0530   GFRAA >90 07/16/2014 0530   Lipase     Component Value Date/Time   LIPASE 26 07/04/2014 1858       Studies/Results: Dg Chest 2 View  07/15/2014   CLINICAL DATA:  Postoperative fever. History of lung cancer and coronary artery disease. ICD10: R 50.82  EXAM: CHEST  2 VIEW  COMPARISON:  11/05/2013  FINDINGS: A left-sided Port-A-Cath which terminates the mid SVC. A right-sided PICC line terminates at the low SVC.  Normal heart size. Left hemidiaphragm elevation is moderate. No pleural fluid. 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 There is also medial right apical opacity which is favored to be due to scarring. Lower lobe predominant interstitial thickening. No lobar consolidation. Minimal volume loss adjacent the left hemidiaphragm.  IMPRESSION: COPD/chronic bronchitis with areas of scarring and atelectasis. No lobar consolidation to suggest pneumonia.   Electronically Signed   By: Abigail Miyamoto M.D.   On: 07/15/2014 14:21    Anti-infectives: Anti-infectives    Start     Dose/Rate Route Frequency Ordered Stop   07/10/14 1500  cefoTEtan (CEFOTAN)  2 g in dextrose 5 % 50 mL IVPB     2 g100 mL/hr over 30 Minutes Intravenous Every 12 hours 07/10/14 1445 07/11/14 2245   07/10/14 0600  cefoTEtan (CEFOTAN) 1 g in dextrose 5 % 50 mL IVPB  Status:  Discontinued     1 g100 mL/hr over 30 Minutes Intravenous On call to O.R. 07/09/14 1013 07/09/14 1539   07/10/14 0600  [MAR Hold]  cefoTEtan (CEFOTAN) 2 g in dextrose 5 % 50 mL IVPB     (MAR Hold since 07/10/14 0718)   2 g100 mL/hr over 30 Minutes Intravenous On call to O.R. 07/09/14 1539 07/10/14 0825   07/09/14 0930  cefoTEtan (CEFOTAN) 2 g in dextrose 5 % 50 mL IVPB  Status:  Discontinued     2 g100 mL/hr over 30 Minutes Intravenous On call to O.R. 07/09/14 0923 07/09/14 1013       Assessment/Plan  1. POD 7, s/p ex lap with  LOA and SBR for SBO 2. A fib 3. ARF, resolved 4. Lung Cancer 5. Hypothyroidism 6. COPD  Plan: 1.  Will set up [facility] for RN/PT/OT.  Will decide tomorrow with VAC change whether he needs to go home with [facility] or convert to WD dressing changes 2. Advance to full liquids today 3. DC PCA and switch to percocet 4. Cardiology make switch from IV to oral agents when they would like.  It is ok from our standpoint 5. Appreciate Dr. Raul Del assistance with this patient as well as cardiology. 6. Patient did desat with ambulation yesterday and had to be replaced on O2 during his session.  He normalized when resting.   LOS: 13 days    Olga Bourbeau E 07/17/2014, 8:30 AM Pager: [phone]

## 2014-07-17 NOTE — Progress Notes (Signed)
NUTRITION FOLLOW UP  Intervention:    Ensure Complete PO TID, each supplement provides 350 kcal and 13 grams of protein  Diet advancement as tolerated per physician.  Nutrition Dx:   Inadequate oral intake related to altered GI function as evidenced by full liquid diet.  Goal:   Intake to meet >90% of estimated nutrition needs. Met.  Monitor:   PO intake, labs, weight trend, ability to wean/stop TPN.  Assessment:   73 YO WM with hx lung Ca, CAD. Presented on 11/4 with several hours of N/V, abd pain, and 3 episodes of non-bloody emesis. In the ER, he was also found to have Afib with RVR.  Patient was receiving TPN until yesterday when TPN line occluded and patient was started on D10. Diet has been advanced to full liquids. No plans to resume TPN at this time.   Spoke with patient and his wife, who report that he drank his soup and vanilla pudding on lunch tray, saving jello for later. Agreed to try Ensure Supplements to maximize oral intake.  Height: Ht Readings from Last 1 Encounters:  07/05/14 6' (1.829 m)    Weight Status:   Wt Readings from Last 1 Encounters:  07/17/14 208 lb (94.348 kg)   07/08/14 205 lb 3.2 oz (93.078 kg)   Re-estimated needs:  Kcal: 2100-2300 Protein: 115-130 gm Fluid: 2.2 L  Skin: abdominal incision  Diet Order: Diet full liquid   Intake/Output Summary (Last 24 hours) at 07/17/14 1329 Last data filed at 07/17/14 1322  Gross per 24 hour  Intake  570.3 ml  Output   2325 ml  Net -1754.7 ml    Last BM: 11/16   Labs:   Recent Labs Lab 07/12/14 0412 07/13/14 0500 07/14/14 0455 07/16/14 0530  NA 148* 148* 141 137  K 3.6* 3.6* 3.3* 3.7  CL 111 112 105 104  CO2 '26 26 26 24  ' BUN '21 16 16 17  ' CREATININE 0.90 0.78 0.64 0.68  CALCIUM 8.2* 7.9* 7.8* 7.7*  MG 1.8 2.1  --  1.7  PHOS 1.4* 2.7 2.9 2.8  GLUCOSE 138* 123* 120* 105*    CBG (last 3)   Recent Labs  07/16/14 2339 07/17/14 0605 07/17/14 1132  GLUCAP 76 104* 102*     Scheduled Meds: . amiodarone      . amiodarone  200 mg Oral Daily  . antiseptic oral rinse  7 mL Mouth Rinse BID  . enoxaparin (LOVENOX) injection  40 mg Subcutaneous Q24H  . insulin aspart  0-15 Units Subcutaneous 4 times per day  . levothyroxine  150 mcg Oral QAC breakfast  . metoprolol tartrate  12.5 mg Oral BID  . sodium chloride  10-40 mL Intracatheter Q12H  . tiotropium  18 mcg Inhalation Daily    Continuous Infusions: . dextrose 50 mL/hr (07/16/14 2200)  . dextrose 5 % and 0.45 % NaCl with KCl 10 mEq/L       Molli Barrows, RD, LDN, [place] Pager [phone] After Hours Pager [phone]

## 2014-07-17 NOTE — Progress Notes (Signed)
PCA pump discontinued. 18 mL of Fentanyl and IV tubing wasted in sharps container with Almira Coaster, RN.

## 2014-07-17 NOTE — Progress Notes (Signed)
Physical Therapy Treatment Patient Details Name: Joseph [name] MRN: [id number] DOB: 08-14-1941 Today's Date: 07/17/2014    History of Present Illness Adm 07/04/14 with abd pain; incarcerated umbilical hernia with exploratory laparotomy, small bowel resection, hernia repair 07/10/14. PMHx-lunc Ca (radiation tx), hepatic cirrhosis, AAA repair, afib, COPD    PT Comments    Pt currently with functional limitations due to weakness, decreased endurance, decreased balance, and decreased mobility. Pt will benefit from skilled PT to increase independence and safety with mobility to allow discharge to home with 24 hour care. Pt able to walk 200 ft with RW with occasional standing rest breaks to increase SaO2. Pt SaO2 ranged from 81-92% during ambulation on 4-6L supplemental O2. Pt's portable monitor did not match the monitor in the outside hall when the two were compared simultaneously so PT believes the SaO2 values during ambulation were slightly higher than those recorded during ambulation and reported above. Pt did not complain of dizziness during ambulation. Pt maintained a 5-6/10 pain level throughout treatment session and his pain was not exacerbated with increased walking. Pt's Bp was 105/60 pre-ambulation and 123/58 post-ambulation.    Follow Up Recommendations  Home health PT;Supervision/Assistance - 24 hour     Equipment Recommendations  Rolling walker with 5" wheels;3in1 (PT)    Recommendations for Other Services       Precautions / Restrictions Precautions Precautions: Fall Restrictions Weight Bearing Restrictions: No    Mobility  Bed Mobility Overal bed mobility: Needs Assistance Bed Mobility: Supine to Sit     Supine to sit: Min assist;HOB elevated;+2 for safety/equipment     General bed mobility comments: Pt required assistance managing lines during movement. Pt needed min physical assistance from PT to bring trunk up to sitting. .   Transfers Overall transfer  level: Needs assistance Equipment used: Rolling walker (2 wheeled) Transfers: Sit to/from Stand Sit to Stand: Min assist;+2 safety/equipment         General transfer comment: Pt required min physical assist to acheive sit to stand. Pt required cuing to push from bed to stand and not use RW.   Ambulation/Gait Ambulation/Gait assistance: Min guard;+2 safety/equipment Ambulation Distance (Feet): 200 Feet Assistive device: Rolling walker (2 wheeled) Gait Pattern/deviations: Decreased stride length;Step-through pattern;Trunk flexed   Gait velocity interpretation: Below normal speed for age/gender General Gait Details: Pt required cues for hand placement on RW during ambulation. Pt min assist for safety and required assistance managing lines during ambulation. Pt able to icnreaase his distance from previous session by nearly 3X as far. Pt required consistent encouragement and motivation that he could walk as far as he did.     Stairs            Wheelchair Mobility    Modified Rankin (Stroke Patients Only)       Balance Overall balance assessment: Needs assistance Sitting-balance support: Feet supported;Bilateral upper extremity supported Sitting balance-Leahy Scale: Fair     Standing balance support: During functional activity;Bilateral upper extremity supported Standing balance-Leahy Scale: Poor Standing balance comment: Pt relied on RW to maintain balance during standing.                     Cognition Arousal/Alertness: Awake/alert Behavior During Therapy: WFL for tasks assessed/performed Overall Cognitive Status: Within Functional Limits for tasks assessed                      Exercises General Exercises - Lower Extremity Ankle Circles/Pumps: AROM;10 reps;Both;Seated Long Arc  Quad: AROM;10 reps;Seated;Both Hip Flexion/Marching: AROM;10 reps;Seated;Both    General Comments        Pertinent Vitals/Pain Pain Assessment: 0-10 Pain Score: 6  Pain  Location: abdomen  Pain Intervention(s): Limited activity within patient's tolerance;Monitored during session;Repositioned;PCA encouraged;Premedicated before session  Pre-ambulation: BP:105/60   HR: 88 Post ambulation: BP: 123/58   HR: 99 SaO2 during ambulation: 81-92% on 4-6L supplemental O2    Home Living                      Prior Function            PT Goals (current goals can now be found in the care plan section) Progress towards PT goals: Progressing toward goals    Frequency  Min 4X/week    PT Plan Current plan remains appropriate    Co-evaluation             End of Session Equipment Utilized During Treatment: Gait belt;Oxygen Activity Tolerance: Patient limited by fatigue Patient left: in chair;with call bell/phone within reach;with family/visitor present     Time: 1518-3437 PT Time Calculation (min) (ACUTE ONLY): 36 min  Charges:  $Gait Training: 8-22 mins $Therapeutic Activity: 8-22 mins                    G CodesJearld Shines, SPT 07/17/2014, 11:01 AM   Jearld Shines, SPT  Acute Rehabilitation [phone]

## 2014-07-17 NOTE — Plan of Care (Signed)
Problem: Phase II Progression Outcomes Goal: Tolerating diet Outcome: Not Progressing  Problem: Phase III Progression Outcomes Goal: Tolerating diet Outcome: Progressing  Problem: Phase II Progression Outcomes Goal: Obtain order to discontinue catheter if appropriate Outcome: Completed/Met Date Met:  07/17/14

## 2014-07-18 LAB — CBC
HCT: 29.7 % — ABNORMAL LOW (ref 39.0–52.0)
Hemoglobin: 9.9 g/dL — ABNORMAL LOW (ref 13.0–17.0)
MCH: 32 pg (ref 26.0–34.0)
MCHC: 33.3 g/dL (ref 30.0–36.0)
MCV: 96.1 fL (ref 78.0–100.0)
Platelets: 193 10*3/uL (ref 150–400)
RBC: 3.09 MIL/uL — AB (ref 4.22–5.81)
RDW: 14.4 % (ref 11.5–15.5)
WBC: 5 10*3/uL (ref 4.0–10.5)

## 2014-07-18 LAB — GLUCOSE, CAPILLARY
Glucose-Capillary: 129 mg/dL — ABNORMAL HIGH (ref 70–99)
Glucose-Capillary: 102 mg/dL — ABNORMAL HIGH (ref 70–99)
Glucose-Capillary: 113 mg/dL — ABNORMAL HIGH (ref 70–99)
Glucose-Capillary: 112 mg/dL — ABNORMAL HIGH (ref 70–99)
Glucose-Capillary: 108 mg/dL — ABNORMAL HIGH (ref 70–99)

## 2014-07-18 LAB — COMPREHENSIVE METABOLIC PANEL
ALT: 47 U/L (ref 0–53)
AST: 45 U/L — ABNORMAL HIGH (ref 0–37)
Albumin: 1.8 g/dL — ABNORMAL LOW (ref 3.5–5.2)
Alkaline Phosphatase: 246 U/L — ABNORMAL HIGH (ref 39–117)
Anion gap: 9 (ref 5–15)
BILIRUBIN TOTAL: 1 mg/dL (ref 0.3–1.2)
BUN: 6 mg/dL (ref 6–23)
CHLORIDE: 104 meq/L (ref 96–112)
CO2: 25 meq/L (ref 19–32)
Calcium: 7.6 mg/dL — ABNORMAL LOW (ref 8.4–10.5)
Creatinine, Ser: 0.68 mg/dL (ref 0.50–1.35)
Glucose, Bld: 104 mg/dL — ABNORMAL HIGH (ref 70–99)
Potassium: 3.9 mEq/L (ref 3.7–5.3)
SODIUM: 138 meq/L (ref 137–147)
Total Protein: 5.2 g/dL — ABNORMAL LOW (ref 6.0–8.3)

## 2014-07-18 NOTE — Progress Notes (Signed)
Subjective: No SOB, dizziness  Objective: Vital signs in last 24 hours: Temp:  [97.5 F (36.4 C)-98.4 F (36.9 C)] 97.5 F (36.4 C) (11/18 0550) Pulse Rate:  [73-92] 73 (11/18 0550) Resp:  [16-22] 18 (11/18 0550) BP: (91-110)/(52-70) 96/57 mmHg (11/18 0550) SpO2:  [93 %] 93 % (11/18 0550) Weight:  [207 lb 7.3 oz (94.1 kg)] 207 lb 7.3 oz (94.1 kg) (11/18 0550) Last BM Date: 07/16/14  Intake/Output from previous day: 11/17 0701 - 11/18 0700 In: 670 [P.O.:670] Out: 2677 [Urine:2675; Stool:2] Intake/Output this shift: Total I/O In: 120 [P.O.:120] Out: -   Medications Current Facility-Administered Medications  Medication Dose Route Frequency Provider Last Rate Last Dose  . acetaminophen (TYLENOL) tablet 650 mg  650 mg Oral Q4H PRN Donnie Mesa, MD   650 mg at 07/14/14 2030  . amiodarone (PACERONE) tablet 200 mg  200 mg Oral Daily Brett Canales, PA-C   200 mg at 07/17/14 1331  . antiseptic oral rinse (CPC / CETYLPYRIDINIUM CHLORIDE 0.05%) solution 7 mL  7 mL Mouth Rinse BID [name], MD   7 mL at 07/17/14 2215  . dextrose 10 % infusion   Intravenous Continuous Ralene Ok, MD 50 mL/hr at 07/16/14 2200 50 mL/hr at 07/16/14 2200  . dextrose 5 % and 0.45 % NaCl with KCl 10 mEq/L infusion   Intravenous Continuous Rolla Flatten, RPH      . enoxaparin (LOVENOX) injection 40 mg  40 mg Subcutaneous Q24H [name], MD   40 mg at 07/17/14 1100  . feeding supplement (ENSURE COMPLETE) (ENSURE COMPLETE) liquid 237 mL  237 mL Oral TID BM Dalene Carrow, RD   237 mL at 07/17/14 2215  . fentaNYL (SUBLIMAZE) injection 25-50 mcg  25-50 mcg Intravenous Q3H PRN Saverio Danker, PA-C      . insulin aspart (novoLOG) injection 0-15 Units  0-15 Units Subcutaneous 4 times per day Wayland Salinas, [facility]   2 Units at 07/14/14 1308  . levalbuterol (XOPENEX) nebulizer solution 0.63 mg  0.63 mg Nebulization Q6H PRN [name], MD      . levothyroxine (SYNTHROID,  LEVOTHROID) tablet 150 mcg  150 mcg Oral QAC breakfast [name], MD   150 mcg at 07/18/14 0734  . metoprolol tartrate (LOPRESSOR) tablet 12.5 mg  12.5 mg Oral BID Brett Canales, PA-C   12.5 mg at 07/17/14 2214  . oxyCODONE-acetaminophen (PERCOCET/ROXICET) 5-325 MG per tablet 1-2 tablet  1-2 tablet Oral Q4H PRN Saverio Danker, PA-C   1 tablet at 07/18/14 0752  . promethazine (PHENERGAN) injection 12.5 mg  12.5 mg Intravenous Q4H PRN Donnie Mesa, MD   12.5 mg at 07/14/14 2242  . sodium chloride 0.9 % injection 10-40 mL  10-40 mL Intracatheter Q12H [name], MD   10 mL at 07/17/14 1100  . sodium chloride 0.9 % injection 10-40 mL  10-40 mL Intracatheter PRN [name], MD   10 mL at 07/16/14 0458  . tiotropium (SPIRIVA) inhalation capsule 18 mcg  18 mcg Inhalation Daily Velna Hatchet, MD   18 mcg at 07/18/14 7782  . zolpidem (AMBIEN) tablet 5 mg  5 mg Oral QHS PRN Sheela Stack, MD   5 mg at 07/08/14 2159    PE: General appearance: alert, cooperative and no distress Lungs: clear to auscultation bilaterally Heart: regular rate and rhythm, S1, S2 normal, no murmur, click, rub or gallop Extremities: No LEE Pulses: 2+ and symmetric Skin: Warm and dry Neurologic: Grossly normal  Lab Results:   Recent Labs  07/16/14 0530  WBC 8.7  HGB 10.5*  HCT 30.8*  PLT 173   BMET  Recent Labs  07/16/14 0530  NA 137  K 3.7  CL 104  CO2 24  GLUCOSE 105*  BUN 17  CREATININE 0.68  CALCIUM 7.7*      Assessment/Plan 1. A-fib with RVR Maintaining NSR. Changed to PO amiodarone 200mg  daily yesterday.  Continue for 4 wks.  Lopressor 12.5mg  BID.  Not a good anticoagulation candidate with bleeding risk and h/o cirrhosis  2. Incarcirated ventral hernia s/p ex lap - wound vac placed, abdomen has not been close yet. However per surgery, stable at this time.  3. Hypothyroidism: started on synthroid    LOS: 14 days    HAGER, BRYAN PA-C 07/18/2014 11:10  AM  Tolerating solid food.  Several small bowel movements. No chest pain or dyspnea. Rhythm remains NSR. Agree with above.

## 2014-07-18 NOTE — Progress Notes (Signed)
SATURATION QUALIFICATIONS: (This note is used to comply with regulatory documentation for home oxygen)  Patient Saturations on Room Air at Rest = 89%  Patient Saturations on Room Air while Ambulating = 82%  Patient Saturations on 2 Liters of oxygen while Ambulating = 94%  Please briefly explain why patient needs home oxygen: Patient's O2 saturation is low at rest and while ambulating without oxygen

## 2014-07-18 NOTE — Progress Notes (Signed)
Subjective: Doing well- tolerating full liquids with 2 soft BM yesterday and 1 larger BM.  Pain controlled with oral pain meds.  Ambulated in hall with PT.  Apprehensive about ability to sit up in bed independently.  Objective: Vital signs in last 24 hours: Temp:  [97.5 F (36.4 C)-98.6 F (37 C)] 97.5 F (36.4 C) (11/18 0550) Pulse Rate:  [73-92] 73 (11/18 0550) Resp:  [13-22] 18 (11/18 0550) BP: (91-123)/(49-70) 96/57 mmHg (11/18 0550) SpO2:  [92 %-94 %] 93 % (11/18 0550) Weight:  [94.1 kg (207 lb 7.3 oz)] 94.1 kg (207 lb 7.3 oz) (11/18 0550) Weight change: -0.248 kg (-8.8 oz) Last BM Date: 07/16/14  CBG (last 3)   Recent Labs  07/17/14 1822 07/17/14 2311 07/18/14 0354  GLUCAP 123* 118* 129*    Intake/Output from previous day: 11/17 0701 - 11/18 0700 In: 670 [P.O.:670] Out: 2677 [Urine:2675; Stool:2] Intake/Output this shift:    General appearance: alert and no distress Eyes: no scleral icterus Throat: oropharynx moist without erythema Resp: clear to auscultation bilaterally Cardio: regular rate and rhythm GI: soft, non-tender; bowel sounds normal; wound vac in place Extremities: no clubbing, cyanosis or edema   Lab Results:  Recent Labs  07/16/14 0530  NA 137  K 3.7  CL 104  CO2 24  GLUCOSE 105*  BUN 17  CREATININE 0.68  CALCIUM 7.7*  MG 1.7  PHOS 2.8    Recent Labs  07/16/14 0530  AST 32  ALT 32  ALKPHOS 157*  BILITOT 0.9  PROT 5.3*  ALBUMIN 1.8*    Recent Labs  07/15/14 1100 07/16/14 0530  WBC 8.2 8.7  HGB 11.1* 10.5*  HCT 32.0* 30.8*  MCV 102.2* 97.5  PLT 137* 173   Lab Results  Component Value Date   INR 1.29 11/01/2013   INR 1.37 11/01/2013   INR 1.32 10/31/2013   No results for input(s): CKTOTAL, CKMB, CKMBINDEX, TROPONINI in the last 72 hours. No results for input(s): TSH, T4TOTAL, T3FREE, THYROIDAB in the last 72 hours.  Invalid input(s): FREET3 No results for input(s): VITAMINB12, FOLATE, FERRITIN, TIBC, IRON,  RETICCTPCT in the last 72 hours.  Studies/Results: No results found.   Medications: Scheduled: . amiodarone  200 mg Oral Daily  . antiseptic oral rinse  7 mL Mouth Rinse BID  . enoxaparin (LOVENOX) injection  40 mg Subcutaneous Q24H  . feeding supplement (ENSURE COMPLETE)  237 mL Oral TID BM  . insulin aspart  0-15 Units Subcutaneous 4 times per day  . levothyroxine  150 mcg Oral QAC breakfast  . metoprolol tartrate  12.5 mg Oral BID  . sodium chloride  10-40 mL Intracatheter Q12H  . tiotropium  18 mcg Inhalation Daily   Continuous: . dextrose 50 mL/hr (07/16/14 2200)  . dextrose 5 % and 0.45 % NaCl with KCl 10 mEq/L      Assessment/Plan: 1. Postoperative Fever- Resolved. CXR clear, UA negative. Suspect atelectasis. Continue IS. 2. Small bowel obstruction secondary to adhesions and incisional hernia- s/p Ex Lap with adhesiolysis, small bowel resection and open repair of incisional hernia (POD #8)- post-op management per [facility]. Progressing well 3. Atrial fibrillation with rapid ventricular response- discharge meds per Cardiology- ?need for Amiodarone and anticoagulation given isolated episode in setting of stress 4. Acute Renal Failure- resolved with hydration.  Check BMET and CBC today. 5. Hepatic cirrhosis- monitor LFTs with TNA. No evidence of ascites on CT. 6. Lung Cancer- no recurrence noted on CT 7. Hypothyroidism- Synthroid restarted at 1/2 dose  IV- will change to home po dose 16mcg daily 8. COPD- stable. Continue Spiriva and use Xopenex as needed (avoid albuterol due to AFib).May need home O2 on discharge if sats remain <88% with ambulation 9. Protein-Calorie Malnutrition- Tolerating full liquids with Ensure supplements 10. Disposition- per general surgery. Continue PT/OT. Anticipate discharge in 1-2 days- I ordered home health PT/OT/RN, O2 (if needed).  Equipment already ordered.  I will see in office within 1 week for TOC visit.    LOS: 14 days   [name] 07/18/2014, 7:20 AM

## 2014-07-18 NOTE — Progress Notes (Signed)
Physical Therapy Treatment Patient Details Name: Joseph [name] MRN: [id number] DOB: Jun 06, 1941 Today's Date: 07/18/2014    History of Present Illness Adm 07/04/14 with abd pain; incarcerated umbilical hernia with exploratory laparotomy, small bowel resection, hernia repair 07/10/14. PMHx-lunc Ca (radiation tx), hepatic cirrhosis, AAA repair, afib, COPD    PT Comments    Pt currently with functional limitations due to decreased strength, mobility and balance. Pt will benefit from skilled PT to increase independence and safety with mobility to allow discharge to home with home health PT. Pt appeared to be in less discomfort than in previous sessions and more engaged with PT throughout session. Pt required less assistance with bed mobility, transfers and ambulation today compared to yesterday's session. Pt continues to need supplemental O2. Pt immediately desat to 89% when put on RA while supine in bed prior to ambulation. Pt ambulated on 2L O2 without complaint of SOB. On RA ambulation pt immediately dropped to 82% and required 2L O2 to get to 94%. Pt will benefit from supplemental O2 at home. Pt had instance of LOB while standing when he tried to fix his pulse oximeter. Pt was able to self-correct. Pt continues to have endurance deficits and required 4 minutes sitting EOB before getting up to ambulate.    Follow Up Recommendations  Home health PT;Supervision/Assistance - 24 hour     Equipment Recommendations  Rolling walker with 5" wheels;3in1 (PT)    Recommendations for Other Services OT consult     Precautions / Restrictions Precautions Precautions: Fall Restrictions Weight Bearing Restrictions: No    Mobility  Bed Mobility Overal bed mobility: Needs Assistance Bed Mobility: Supine to Sit     Supine to sit: Min assist;HOB elevated     General bed mobility comments: Pt requires min assist for bed mobility to assist with trunk control while moving supine to sit.     Transfers Overall transfer level: Needs assistance Equipment used: Rolling walker (2 wheeled) Transfers: Sit to/from Stand Sit to Stand: Min assist         General transfer comment: Pt required boost to acheive standing from lowered bed. Pt able to sequence transfer without verbal cues.   Ambulation/Gait Ambulation/Gait assistance: Min guard Ambulation Distance (Feet): 200 Feet Assistive device: Rolling walker (2 wheeled) Gait Pattern/deviations: Step-through pattern;Decreased stride length;Trunk flexed   Gait velocity interpretation: Below normal speed for age/gender General Gait Details: Pt with improved ambulation compared to previous treatment session. Pt ambulated at increased pace compared to previous session and was able to ambulate on 2 L supplemental O2 without complaints of SOB. PTs took breaks with ambulation to check pt's SaO2 but patient did not request any rest breaks with ambulation. Pt had instance of loss of balance during standing when he tried to fix the pulse oximeter on his finger. Pt was able to right himself and cannot take challenge during standing and ambulation. Pt ambulated with heavy trunk flexion and required verbal cues to address this.    Stairs            Wheelchair Mobility    Modified Rankin (Stroke Patients Only)       Balance Overall balance assessment: Needs assistance Sitting-balance support: Feet supported;No upper extremity supported Sitting balance-Leahy Scale: Fair     Standing balance support: During functional activity;Bilateral upper extremity supported Standing balance-Leahy Scale: Poor Standing balance comment: Pt relied on RW for balance during standing and ambulation.  Cognition Arousal/Alertness: Awake/alert Behavior During Therapy: WFL for tasks assessed/performed Overall Cognitive Status: Within Functional Limits for tasks assessed                      Exercises  Pt unwilling  to perform exercises after ambulation.     General Comments        Pertinent Vitals/Pain Pain Assessment: 0-10 Pain Score: 4  Pain Location: Right abdomen/hip  Pain Descriptors / Indicators: Constant Pain Intervention(s): Limited activity within patient's tolerance;Monitored during session;Repositioned  Pt reported pain decreased with ambulation. Stated the pain is no longer at his incision.   Patient Saturations on Room Air at Rest = 89% Patient Saturations on [employer] while Ambulating = 82% Patient Saturations on 2 Liters of oxygen while Ambulating = 94% Please briefly explain why patient needs home oxygen: Patient's O2 saturation is low at rest and while ambulating without oxygen    Home Living                      Prior Function            PT Goals (current goals can now be found in the care plan section) Progress towards PT goals: Progressing toward goals    Frequency  Min 4X/week    PT Plan Current plan remains appropriate    Co-evaluation             End of Session Equipment Utilized During Treatment: Gait belt;Oxygen Activity Tolerance: Patient limited by fatigue Patient left: in chair;with call bell/phone within reach;with family/visitor present     Time: 2010-0712 PT Time Calculation (min) (ACUTE ONLY): 30 min  Charges:  $Gait Training: 8-22 mins $Self Care/Home Management: 8-22                    G CodesJearld [name], SPT 07/18/2014, 2:25 PM  Joseph [name], Joseph [name]  Acute Rehabilitation [phone]

## 2014-07-18 NOTE — Progress Notes (Signed)
Patient ID: Joseph [name], male   DOB: Jan 22, 1941, 73 y.o.   MRN: [id number] 8 Days Post-Op  Subjective: Pt doing well.  Tolerating full liquids well.  Had 2 soft BMs yesterday.  No nausea.  Pain well controlled on oral pain meds.  Still requiring O2.  Objective: Vital signs in last 24 hours: Temp:  [97.5 F (36.4 C)-98.4 F (36.9 C)] 97.5 F (36.4 C) (11/18 0550) Pulse Rate:  [73-92] 73 (11/18 0550) Resp:  [16-22] 18 (11/18 0550) BP: (91-123)/(49-70) 96/57 mmHg (11/18 0550) SpO2:  [93 %-94 %] 93 % (11/18 0550) Weight:  [207 lb 7.3 oz (94.1 kg)] 207 lb 7.3 oz (94.1 kg) (11/18 0550) Last BM Date: 07/16/14  Intake/Output from previous day: 11/17 0701 - 11/18 0700 In: 670 [P.O.:670] Out: 2677 [Urine:2675; Stool:2] Intake/Output this shift:    PE: Abd: soft, wound is clean and still fairly good size, cont VAC for now, +BS, ND  Lab Results:   Recent Labs  07/15/14 1100 07/16/14 0530  WBC 8.2 8.7  HGB 11.1* 10.5*  HCT 32.0* 30.8*  PLT 137* 173   BMET  Recent Labs  07/16/14 0530  NA 137  K 3.7  CL 104  CO2 24  GLUCOSE 105*  BUN 17  CREATININE 0.68  CALCIUM 7.7*   PT/INR No results for input(s): LABPROT, INR in the last 72 hours. CMP     Component Value Date/Time   NA 137 07/16/2014 0530   NA 143 07/03/2014 1024   K 3.7 07/16/2014 0530   K 3.9 07/03/2014 1024   CL 104 07/16/2014 0530   CL 109* 08/01/2012 1046   CO2 24 07/16/2014 0530   CO2 25 07/03/2014 1024   GLUCOSE 105* 07/16/2014 0530   GLUCOSE 96 07/03/2014 1024   GLUCOSE 93 08/01/2012 1046   BUN 17 07/16/2014 0530   BUN 19.4 07/03/2014 1024   CREATININE 0.68 07/16/2014 0530   CREATININE 1.0 07/03/2014 1024   CREATININE 1.00 08/17/2013 1250   CALCIUM 7.7* 07/16/2014 0530   CALCIUM 9.3 07/03/2014 1024   PROT 5.3* 07/16/2014 0530   PROT 7.2 07/03/2014 1024   ALBUMIN 1.8* 07/16/2014 0530   ALBUMIN 3.4* 07/03/2014 1024   AST 32 07/16/2014 0530   AST 29 07/03/2014 1024   ALT 32 07/16/2014  0530   ALT 25 07/03/2014 1024   ALKPHOS 157* 07/16/2014 0530   ALKPHOS 145 07/03/2014 1024   BILITOT 0.9 07/16/2014 0530   BILITOT 1.33* 07/03/2014 1024   GFRNONAA >90 07/16/2014 0530   GFRAA >90 07/16/2014 0530   Lipase     Component Value Date/Time   LIPASE 26 07/04/2014 1858       Studies/Results: No results found.  Anti-infectives: Anti-infectives    Start     Dose/Rate Route Frequency Ordered Stop   07/10/14 1500  cefoTEtan (CEFOTAN) 2 g in dextrose 5 % 50 mL IVPB     2 g100 mL/hr over 30 Minutes Intravenous Every 12 hours 07/10/14 1445 07/11/14 2245   07/10/14 0600  cefoTEtan (CEFOTAN) 1 g in dextrose 5 % 50 mL IVPB  Status:  Discontinued     1 g100 mL/hr over 30 Minutes Intravenous On call to O.R. 07/09/14 1013 07/09/14 1539   07/10/14 0600  [MAR Hold]  cefoTEtan (CEFOTAN) 2 g in dextrose 5 % 50 mL IVPB     (MAR Hold since 07/10/14 0718)   2 g100 mL/hr over 30 Minutes Intravenous On call to O.R. 07/09/14 1539 07/10/14 0825   07/09/14  0930  cefoTEtan (CEFOTAN) 2 g in dextrose 5 % 50 mL IVPB  Status:  Discontinued     2 g100 mL/hr over 30 Minutes Intravenous On call to O.R. 07/09/14 0923 07/09/14 1013       Assessment/Plan  1. POD 8, s/p ex lap with LOA and SBR for SBO 2. A fib 3. ARF, resolved 4. Lung Cancer 5. Hypothyroidism 6. COPD   Plan: 1. Will cont VAC at home given the size of the wound still. 2. Add respiratory care to [name] orders as he is still requiring O2.  Dr. Brigitte Pulse aware of this.  He will follow up with him 1 week after discharge 3. Advance to soft diet 4. SL IV 5. On oral meds from cards standpoint 6. Appreciate cards and Dr. Brigitte Pulse 7. Anticipate home likely tomorrow if tolerates his advancement today  LOS: 14 days    Joseph [name] E 07/18/2014, 8:31 AM Pager: [phone]

## 2014-07-18 NOTE — Consult Note (Signed)
WOC wound consult note Assessed abd wound with [facility]CS PA Claiborne Billings at bedside to discuss plan of care.  Wound assessment unchanged from previous consult. Wound type: Full thickness post-op wound Wound bed: 90% beefy red, 10% black area in middle of wound Drainage (amount, consistency, odor) Small amt yellow drainage, no odor Periwound: Intact skin surrounding Dressing procedure/placement/frequency: Bedside nurse is going to re-apply vac dressing with cont suction at 150mm. Wife at bedside to assess wound. Bedside nurse can change Q M/W/F. Please re-consult if further assistance is needed. Thank-you,  Julien Girt MSN, [place], [place], [place], [place]

## 2014-07-19 LAB — GLUCOSE, CAPILLARY
Glucose-Capillary: 105 mg/dL — ABNORMAL HIGH (ref 70–99)
Glucose-Capillary: 95 mg/dL (ref 70–99)
Glucose-Capillary: 111 mg/dL — ABNORMAL HIGH (ref 70–99)

## 2014-07-19 MED ORDER — HEPARIN SOD (PORK) LOCK FLUSH 100 UNIT/ML IV SOLN
500.0000 [IU] | INTRAVENOUS | Status: AC | PRN
  Administered 2014-07-19: 500 [IU]

## 2014-07-19 MED ORDER — OXYCODONE-ACETAMINOPHEN 5-325 MG PO TABS: 1.0000 | ORAL_TABLET | ORAL | Status: DC | PRN

## 2014-07-19 MED ORDER — ASPIRIN EC 81 MG PO TBEC
81.0000 mg | DELAYED_RELEASE_TABLET | Freq: Every day | ORAL | Status: DC
  Administered 2014-07-19: 81 mg via ORAL
  Filled 2014-07-19: qty 1

## 2014-07-19 MED ORDER — AMIODARONE HCL 200 MG PO TABS: 200.0000 mg | ORAL_TABLET | Freq: Every day | ORAL | Status: DC

## 2014-07-19 MED ORDER — LEVOTHYROXINE SODIUM 75 MCG PO TABS: 175.0000 ug | ORAL_TABLET | Freq: Every day | ORAL | Status: DC

## 2014-07-19 MED ORDER — METOPROLOL TARTRATE 12.5 MG HALF TABLET: 12.5000 mg | ORAL_TABLET | Freq: Two times a day (BID) | ORAL | Status: DC

## 2014-07-19 NOTE — Discharge Instructions (Signed)
[facility]      [facility] °[phone] ° °OPEN ABDOMINAL SURGERY: POST OP INSTRUCTIONS ° °Always review your discharge instruction sheet given to you by the facility where your surgery was performed. ° °IF YOU HAVE DISABILITY OR FAMILY LEAVE FORMS, YOU MUST BRING THEM TO THE OFFICE FOR PROCESSING.  PLEASE DO NOT GIVE THEM TO YOUR DOCTOR. ° °1. A prescription for pain medication may be given to you upon discharge.  Take your pain medication as prescribed, if needed.  If narcotic pain medicine is not needed, then you may take acetaminophen (Tylenol) or ibuprofen (Advil) as needed. °2. Take your usually prescribed medications unless otherwise directed. °3. If you need a refill on your pain medication, please contact your pharmacy. They will contact our office to request authorization.  Prescriptions will not be filled after 5pm or on week-ends. °4. You should follow a light diet the first few days after arrival home, such as soup and crackers, pudding, etc.unless your doctor has advised otherwise. A high-fiber, low fat diet can be resumed as tolerated.   Be sure to include lots of fluids daily. Most patients will experience some swelling and bruising on the chest and neck area.  Ice packs will help.  Swelling and bruising can take several days to resolve °5. Most patients will experience some swelling and bruising in the area of the incision. Ice pack will help. Swelling and bruising can take several days to resolve..  °6. It is common to experience some constipation if taking pain medication after surgery.  Increasing fluid intake and taking a stool softener will usually help or prevent this problem from occurring.  A mild laxative (Milk of Magnesia or Miralax) should be taken according to package directions if there are no bowel movements after 48 hours. °7.  You may have steri-strips (small skin tapes) in place directly over the incision.  These strips should be left on the skin for 7-10 days.  If your  surgeon used skin glue on the incision, you may shower in 24 hours.  The glue will flake off over the next 2-3 weeks.  Any sutures or staples will be removed at the office during your follow-up visit. You may find that a light gauze bandage over your incision may keep your staples from being rubbed or pulled. You may shower and replace the bandage daily. °8. ACTIVITIES:  You may resume regular (light) daily activities beginning the next day--such as daily self-care, walking, climbing stairs--gradually increasing activities as tolerated.  You may have sexual intercourse when it is comfortable.  Refrain from any heavy lifting or straining until approved by your doctor. °a. You may drive when you no longer are taking prescription pain medication, you can comfortably wear a seatbelt, and you can safely maneuver your car and apply brakes °b. Return to Work: ___________________________________ °9. You should see your doctor in the office for a follow-up appointment approximately two weeks after your surgery.  Make sure that you call for this appointment within a day or two after you arrive home to insure a convenient appointment time. °OTHER INSTRUCTIONS:  °_____________________________________________________________ °_____________________________________________________________ ° °WHEN TO CALL YOUR DOCTOR: °1. Fever over 101.0 °2. Inability to urinate °3. Nausea and/or vomiting °4. Extreme swelling or bruising °5. Continued bleeding from incision. °6. Increased pain, redness, or drainage from the incision. °7. Difficulty swallowing or breathing °8. Muscle cramping or spasms. °9. Numbness or tingling in hands or feet or around lips. ° °The clinic staff is available to   answer your questions during regular business hours.  Please dont hesitate to call and ask to speak to one of the nurses if you have concerns.  For further questions, please visit [email or web address]  Vacuum-Assisted Closure  Therapy Vacuum-assisted closure (VAC) therapy uses a device that removes fluid and germs from wounds to help them heal. It is used on wounds that cannot be closed with stitches. They often heal slowly. Vacuum-assisted therapy helps the wound stay clean and healthy while the open wound slowly grows back together. Vacuum-assisted closure therapy uses a bandage (dressing) that is made of foam. It is put inside the wound. Then, a drape is placed over the wound. This drape sticks to your skin to keep air out, and to protect the wound. A tube is hooked up to a small pump and is attached to the drape. The pump sucks out the fluid and germs. Vacuum-assisted closure therapy can also help reduce the bad smell that comes from the wound. HOW DOES IT WORK?  The vacuum pump pulls fluid through the foam dressing. The dressing may wrinkle during this process. The fluid goes into the tube and away from the wound. The fluid then goes into a container. The fluid in the container must be replaced if it is full or at least once a week, even if the container is not full. The pulling from the pump helps to close the wound and bring better circulation to the wound area. The foam dressing covers and protects the wound. It helps your wound heal faster.  HOW DOES IT FEEL?   You might feel a little pulling when the pump is on.  You might also feel a mild vibrating sensation.  You might feel some discomfort when the dressing is taken off. CAN I MOVE AROUND WITH VACUUM-ASSISTED CLOSURE THERAPY? Yes, it has a backup battery which is used when the machine is not plugged in, as long as the battery is working, you can move freely. WHAT ARE SOME THINGS I MUST KNOW?  Do not turn off the pump yourself, unless instructed to do so by your healthcare provider, such as for bathing.  Do not take off the dressing yourself, unless instructed to do so by your caregiver.  You can wash or shower with the dressing. However, do not take the  pump into the shower. Make sure the wound dressing is protected and covered with plastic. The wound area must stay dry.  Do not turn off the pump for more than 2 hours. If the pump is off for more than 2 hours, your nurse must change your dressing.  Check frequently that the machine is on, that the machine indicates the therapy is on, and that all clamps are open. THE ALARM IS SOUNDING! WHAT SHOULD I DO?   Stay calm.  Do not turn off the pump or do anything with the dressing.  Call your clinic or caregiver right away if the alarm goes off and you cannot fix the problem. Some reasons the alarm might go off include:  The fluid collection container is full.  The battery is low.  The dressing has a leak.  Explain to your caregiver what is happening. Follow the instructions you receive. WHEN SHOULD I CALL FOR HELP?   You have severe pain.  You have difficulty breathing.  You have bleeding that will not stop.  Your wound smells bad.  You have redness, swelling, or fluid leaking from your wound.  Your alarm goes off and you do not  know what to do.  You have a fever.  Your wound itches severely.  Your dressing changes are often painful or bleeding often occurs.  You have diarrhea.  You have a sore throat.  You have a rash around the dressing or anywhere else on your body.  You feel nauseous.  You feel dizzy or weak.  The [facility] machine has been off for more than 2 hours. HOW DO I GET READY TO GO HOME WITH A PUMP?  A trained caregiver will talk to you and answer your questions about your vacuum-assisted closure therapy before you go home. He or she will explain what to expect. A caregiver will come to your home to apply the pump and care for your wound. The at-home caregiver will be available for questions and will come back for the scheduled dressing changes, usually every 48-72 hours (or more often for severely infected wounds). Your at-home caregiver will also come if you  are having an unexpected problem. If you have questions or do not know what to do when you go home, talk to your healthcare provider. Document Released: 07/30/2008 Document Revised: 04/19/2013 Document Reviewed: 07/31/2011 [facility] Patient Information 2015 [place], [place]. This information is not intended to replace advice given to you by your health care provider. Make sure you discuss any questions you have with your health care provider.

## 2014-07-19 NOTE — Plan of Care (Signed)
Problem: Discharge Progression Outcomes Goal: Barriers To Progression Addressed/Resolved Outcome: Completed/Met Date Met:  07/19/14 Goal: Discharge plan in place and appropriate Outcome: Completed/Met Date Met:  07/19/14 Goal: Pain controlled with appropriate interventions Outcome: Completed/Met Date Met:  07/19/14 Goal: Hemodynamically stable Outcome: Completed/Met Date Met:  52/17/47 Goal: Complications resolved/controlled Outcome: Completed/Met Date Met:  07/19/14 Goal: Tolerating diet Outcome: Completed/Met Date Met:  07/19/14 Goal: Activity appropriate for discharge plan Outcome: Completed/Met Date Met:  07/19/14 Goal: Tubes and drains discontinued if indicated Outcome: Completed/Met Date Met:  07/19/14 Goal: Staples/sutures removed Outcome: Not Applicable Date Met:  15/95/39 Goal: Steri-Strips applied Outcome: Not Applicable Date Met:  67/28/97 Goal: Other Discharge Outcomes/Goals Outcome: Completed/Met Date Met:  07/19/14

## 2014-07-19 NOTE — Progress Notes (Signed)
Pt discharged to home per MD order. Pt and wife received and reviewed all discharge instructions and medication information including follow-up appointments and prescription information. Pt and wife verbalized understanding. Pt alert and oriented at discharge with no complaints of pain. Pt IV and telemetry box removed prior to discharge. Pt escorted to private vehicle via wheelchair by nurse tech and guest services volunteer. Joseph [name]

## 2014-07-19 NOTE — Discharge Summary (Signed)
Patient ID: Joseph [name] MRN: [id number] DOB/AGE: 71-Sep-1942 71 y.o.  Admit date: 07/04/2014 Discharge date: 07/19/2014  Procedures:  1. Exploratory laparotomy. 2. Lysis of adhesions x90 minutes. 3. Small bowel resection. 4. Open primary repair of incisional hernia. Dr. Greer Pickerel on 07-10-14  Consults: cardiology, general surgery, vascular surgery and internal medicine  Reason for Admission: 73 YO WM with hx lung Ca, CAD. Presents with several hours of N/V. Has had several visits to various drs in the last 3 weeks. He was treated for possible infectious diarrhea and has had eval for both AAA progression and for an incisional hernia. Now he presents with abd pain and 3 episodes of non-bloody emesis. He has an NGT in place now and has been seen by [facility]. In the ER, he was also found to have Afib with RVR and is now on a cardizem drip He was seen by Surgery and had a CT "office yesterday and a CT scan was done demonstrating a loop of small bowel in a chronically incarcerated ventral hernia, but today started having nausea and vomiting." they note "Assessment/Plan:SBO from chronically incarcerated ventral hernia History of previous repair with mesh No bowel is felt to be at ischemic risk The patient has noted a "swelling and reduction" of his abdomen over the past few days. His last PO intake was several hours ago. He denies F/c/s. His abd pain is improved with the NGT. He has no CP or SOB and did not feel the fast HR.  His weight has been stable. His port is still in place but his last CA tx was 2+yrs ago.  He has chronic knee pain and has been awaiting replacement  Admission Diagnoses:  1. SBO 2. A fib with RVR 3. Hypothyroidism 4. Cirrhosis 5. Lung cancer 6. AKI 7. COPD/emphysema 8. AAA 9. S/p recent bypass by vascular surgery  Hospital Course: The patient was initially admitted and managed by his primary care team.  Cardiology was consulted for his a. Fib and general  surgery was consulted for his SBO.  1. SBO: The patient was initially treated with conservative measures with an NGT and bowel rest.  He initially made some improvements, but ultimately worsened.  Finally on HD 6, it was decided to proceed to the OR for surgical intervention.  He was on a heparin drip and this was held pre-operatively.  He then underwent the above listed procedure.  He tolerated this well, but due to his need for cardiac drips and his other medical problems he was sent to SDU post op for closer monitoring.  His NGT remained in place postoperatively due to a post op ileus.  He was initiated on TNA on POD 1, due to 7 days of no oral intake.  He began to pass some flatus and BMs on POD 4.  His NGT was discontinued and he was started on a clear liquid diet.  He was also transferred at this time to a tele floor.  His diet was gradually able to be advanced as tolerated to a soft diet.  His abdominal wound was left open after surgery and a wound wound VAC was placed.  VAC was approved by insurance and he will discharge home with this in place.  His wound is granulating in very well and looks good.  Patient was stable on POD 9 for dc home from surgical standpoint.  He will be dc home with [facility] for PT/OT/RN/Respiratory care.  2. A.fib with RVR: the patient was admitted in  a fib with RVR.  Cardiology was consulted.  The patient converted to NSR, but was prophylactically placed on IV drip prior to surgery due to concern for further a fib.  He remained on amio drip and lopressor IV post op.  Once he was tolerating full liquids, he was transitioned to oral amio 200mg  daily and metoprolol 12.5mg  BID.  3. COPD: the patient had some issues being weaned from his O2 post operatively, especially with mobilization.  He will be sent home on 2L of O2.  Dr. Brigitte Pulse is aware of this and will further manage this as an outpatient.  4. The patient's other medical problems remained stable throughout the hospital  stay.  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   Atrial fibrillation with rapid ventricular response   Small bowel obstruction   COPD (chronic obstructive pulmonary disease)   Hypothyroidism   Postoperative fever   Atrial fibrillation, unspecified   Discharge Medications:   Medication List    STOP taking these medications        doxycycline 100 MG capsule  Commonly known as:  VIBRAMYCIN     HYDROcodone-acetaminophen 5-325 MG per tablet  Commonly known as:  NORCO/VICODIN     metroNIDAZOLE 250 MG tablet  Commonly known as:  FLAGYL     traMADol 50 MG tablet  Commonly known as:  ULTRAM      TAKE these medications        amiodarone 200 MG tablet  Commonly known as:  PACERONE  Take 1 tablet (200 mg total) by mouth daily.     DIGESTIVE HEALTH PROBIOTIC Caps  Take 1 capsule by mouth daily. "30 million cultures"     furosemide 20 MG tablet  Commonly known as:  LASIX  Take 1 tablet (20 mg total) by mouth daily.     lidocaine-prilocaine cream  Commonly known as:  EMLA  Apply 1 application topically as needed.     metoprolol tartrate 12.5 mg Tabs tablet  Commonly known as:  LOPRESSOR  Take 0.5 tablets (12.5 mg total) by mouth 2 (two) times daily.     metroNIDAZOLE 0.75 % gel  Commonly known as:  METROGEL  Apply 1 application topically 2 (two) times daily.     MILK THISTLE PO  Take 1 capsule by mouth daily. Take one 1200 mg capsule daily     multivitamins ther. w/minerals Tabs tablet  Take 1 tablet by mouth daily.     nystatin-triamcinolone cream  Commonly known as:  MYCOLOG II  Apply 1 application topically daily as needed. Rash     OVER THE COUNTER MEDICATION  30 mg 3 (three) times daily.  LUNG SUPPORT     oxyCODONE-acetaminophen 5-325 MG per tablet  Commonly known as:  PERCOCET/ROXICET  Take 1-2 tablets by mouth every 4 (four) hours as needed for moderate pain.     SYNTHROID 175 MCG tablet  Generic drug:  levothyroxine  Take 175 mcg by mouth daily.     tiotropium 18 MCG inhalation capsule  Commonly known as:  SPIRIVA  Place 18 mcg into inhaler and inhale daily.        Discharge Instructions:     Follow-up Information    Follow up with  [facility].   Why:  Registered Nurse, Physical and Occupational Therapy   Contact information:   [address] [place] [address] [phone]       Follow up with [facility].   Why:  Vassie Moselle and [facility]   Contact information:   [address] [place] [address] [phone]       Follow up with [name], MD In 2 weeks.   Specialty:  General Surgery   Why:  our office will call you   Contact information:   [place] [address] [phone]       Follow up with [name], MD In 1 week.   Specialty:  Internal Medicine   Why:  their office will call you   Contact information:   [address] [place] [address] [phone]       Follow up with NELSON,KRISTINE. Schedule an appointment as soon as possible for a visit in 2 weeks.      Signed: Glendora Clouatre E 07/19/2014, 8:48 AM

## 2014-07-19 NOTE — Progress Notes (Signed)
Subjective: Doing well- tolerated regular diet.  No BM but having flatus.  Pain controlled.  Ambulating with walker.  Objective: Vital signs in last 24 hours: Temp:  [97.4 F (36.3 C)-98.8 F (37.1 C)] 98.8 F (37.1 C) (11/19 0500) Pulse Rate:  [78-91] 89 (11/19 0500) Resp:  [18-22] 22 (11/19 0500) BP: (99-107)/(58-63) 99/63 mmHg (11/19 0500) SpO2:  [94 %-96 %] 94 % (11/19 0500) Weight:  [95.391 kg (210 lb 4.8 oz)] 95.391 kg (210 lb 4.8 oz) (11/19 0500) Weight change: 1.291 kg (2 lb 13.6 oz) Last BM Date: 07/16/14  CBG (last 3)   Recent Labs  07/18/14 1634 07/18/14 2152 07/19/14 0640  GLUCAP 112* 108* 105*    Intake/Output from previous day: 11/18 0701 - 11/19 0700 In: 120 [P.O.:120] Out: 420 [Urine:420] Intake/Output this shift:    General appearance: alert and no distress Eyes: no scleral icterus Throat: oropharynx moist without erythema Resp: clear to auscultation bilaterally Cardio: regular rate and rhythm GI: soft non-distended with normal bowel sounds; minimal tenderness surrounding incision Extremities: no clubbing, cyanosis or edema   Lab Results:  Recent Labs  07/18/14 0800  NA 138  K 3.9  CL 104  CO2 25  GLUCOSE 104*  BUN 6  CREATININE 0.68  CALCIUM 7.6*    Recent Labs  07/18/14 0800  AST 45*  ALT 47  ALKPHOS 246*  BILITOT 1.0  PROT 5.2*  ALBUMIN 1.8*    Recent Labs  07/18/14 0800  WBC 5.0  HGB 9.9*  HCT 29.7*  MCV 96.1  PLT 193    Studies/Results: No results found.   Medications: Scheduled: . amiodarone  200 mg Oral Daily  . antiseptic oral rinse  7 mL Mouth Rinse BID  . aspirin EC  81 mg Oral Daily  . enoxaparin (LOVENOX) injection  40 mg Subcutaneous Q24H  . feeding supplement (ENSURE COMPLETE)  237 mL Oral TID BM  . insulin aspart  0-15 Units Subcutaneous 4 times per day  . levothyroxine  150 mcg Oral QAC breakfast  . metoprolol tartrate  12.5 mg Oral BID  . sodium chloride  10-40 mL Intracatheter Q12H  .  tiotropium  18 mcg Inhalation Daily   Continuous: . dextrose 50 mL/hr (07/16/14 2200)  . dextrose 5 % and 0.45 % NaCl with KCl 10 mEq/L      Assessment/Plan: 1. Small bowel obstruction secondary to adhesions and incisional hernia- s/p Ex Lap with adhesiolysis, small bowel resection and open repair of incisional hernia (POD #8)- post-op management per [facility]. Progressing well 2. Atrial fibrillation with rapid ventricular response- continue Amiodarone 200mg  daily X 4 weeks, then stop per Cardiology.  Continue Metoprolol.  No anticoagulation.  Will add ASA 81mg  daily. 3. Acute Renal Failure- resolved with hydration.  4. Hepatic cirrhosis- monitor LFTs with TNA. No evidence of ascites on CT. 5. Lung Cancer- no recurrence noted on CT 6. Hypothyroidism- continue Synthroid- change to 146mcg daily (home dose) 7. COPD with hypoxia- stable. Continue Spiriva and use Xopenex as needed (avoid albuterol due to AFib).O2 sats dropped to 84% with ambulation- will need O2 2L continuous but can monitor sats at home and wean off when staying above 90% 8. Protein-Calorie Malnutrition- Continue Ensure supplements 9. Anemia- relatively stable- will recheck as outpatient 10. Disposition- per general surgery. Stable for discharge from my standpoint.  Continue PT/OT.I ordered home health PT/OT/RN, O2. Equipment already ordered. I will see in office within 1 week for TOC visit.  Appreciate General Surgery management and Cardiology assistance.  LOS: 15 days   Joseph [name] 07/19/2014, 7:17 AM

## 2014-07-19 NOTE — Plan of Care (Signed)
Problem: Phase II Progression Outcomes Goal: Tolerating diet Outcome: Completed/Met Date Met:  07/19/14  Problem: Phase III Progression Outcomes Goal: Tolerating diet Outcome: Completed/Met Date Met:  07/19/14  Problem: Discharge Progression Outcomes Goal: Barriers To Progression Addressed/Resolved Outcome: Completed/Met Date Met:  07/19/14 Goal: Discharge plan in place and appropriate Outcome: Completed/Met Date Met:  07/19/14 Goal: Sinus rate/atrial ECG rhythm with HR < 100/min Outcome: Completed/Met Date Met:  07/19/14 Goal: Pain controlled with appropriate interventions Outcome: Completed/Met Date Met:  07/19/14 Goal: Hemodynamically stable Outcome: Completed/Met Date Met:  54/65/03 Goal: Complications resolved/controlled Outcome: Completed/Met Date Met:  07/19/14 Goal: Tolerating diet Outcome: Completed/Met Date Met:  07/19/14 Goal: Activity appropriate for discharge plan Outcome: Completed/Met Date Met:  07/19/14 Goal: INR monitor plan established Outcome: Not Applicable Date Met:  54/65/68  Problem: Consults Goal: General Medical Patient Education See Patient Education Module for specific education.  Outcome: Completed/Met Date Met:  07/19/14 Goal: Skin Care Protocol Initiated - if Braden Score 18 or less If consults are not indicated, leave blank or document N/A  Outcome: Completed/Met Date Met:  07/19/14 Goal: Nutrition Consult-if indicated Outcome: Not Applicable Date Met:  12/75/17  Problem: Phase I Progression Outcomes Goal: Other Phase I Outcomes/Goals Outcome: Completed/Met Date Met:  07/19/14  Problem: Phase II Progression Outcomes Goal: Progress activity as tolerated unless otherwise ordered Outcome: Completed/Met Date Met:  07/19/14 Goal: Discharge plan established Outcome: Completed/Met Date Met:  07/19/14 Goal: IV changed to normal saline lock Outcome: Completed/Met Date Met:  07/19/14 Goal: Other Phase II Outcomes/Goals Outcome: Completed/Met Date  Met:  07/19/14  Problem: Phase III Progression Outcomes Goal: Pain controlled on oral analgesia Outcome: Completed/Met Date Met:  07/19/14 Goal: Activity at appropriate level-compared to baseline (UP IN CHAIR FOR HEMODIALYSIS)  Outcome: Completed/Met Date Met:  07/19/14 Goal: Voiding independently Outcome: Completed/Met Date Met:  07/19/14 Goal: IV/normal saline lock discontinued Outcome: Completed/Met Date Met:  07/19/14 Goal: Foley discontinued Outcome: Not Applicable Date Met:  00/17/49 Goal: Discharge plan remains appropriate-arrangements made Outcome: Completed/Met Date Met:  07/19/14 Goal: Other Phase III Outcomes/Goals Outcome: Completed/Met Date Met:  07/19/14  Problem: Discharge Progression Outcomes Goal: Discharge plan in place and appropriate Outcome: Completed/Met Date Met:  07/19/14 Goal: Pain controlled with appropriate interventions Outcome: Completed/Met Date Met:  07/19/14 Goal: Hemodynamically stable Outcome: Completed/Met Date Met:  44/96/75 Goal: Complications resolved/controlled Outcome: Completed/Met Date Met:  07/19/14 Goal: Tolerating diet Outcome: Completed/Met Date Met:  07/19/14 Goal: Activity appropriate for discharge plan Outcome: Completed/Met Date Met:  07/19/14 Goal: Other Discharge Outcomes/Goals Outcome: Completed/Met Date Met:  07/19/14  Problem: Consults Goal: General Surgical Patient Education (See Patient Education module for education specifics)  Outcome: Completed/Met Date Met:  07/19/14 Goal: Skin Care Protocol Initiated - if Braden Score 18 or less If consults are not indicated, leave blank or document N/A  Outcome: Completed/Met Date Met:  07/19/14 Goal: Nutrition Consult-if indicated Outcome: Not Applicable Date Met:  91/63/84 Goal: Diabetes Guidelines if Diabetic/Glucose > 140 If diabetic or lab glucose is > 140 mg/dl - Initiate Diabetes/Hyperglycemia Guidelines & Document Interventions  Outcome: Completed/Met Date Met:   07/19/14  Problem: Phase I Progression Outcomes Goal: Pain controlled with appropriate interventions Outcome: Completed/Met Date Met:  07/19/14 Goal: OOB as tolerated unless otherwise ordered Outcome: Completed/Met Date Met:  07/19/14 Goal: Sutures/staples intact Outcome: Completed/Met Date Met:  07/19/14 Goal: Initial discharge plan identified Outcome: Completed/Met Date Met:  07/19/14 Goal: Voiding-avoid urinary catheter unless indicated Outcome: Completed/Met Date Met:  07/19/14 Goal: Vital signs/hemodynamically stable Outcome:  Completed/Met Date Met:  07/19/14 Goal: Other Phase I Outcomes/Goals Outcome: Completed/Met Date Met:  07/19/14  Problem: Phase II Progression Outcomes Goal: Pain controlled Outcome: Completed/Met Date Met:  07/19/14 Goal: Progress activity as tolerated unless otherwise ordered Outcome: Completed/Met Date Met:  07/19/14 Goal: Progressing with IS, TCDB Outcome: Completed/Met Date Met:  07/19/14 Goal: Vital signs stable Outcome: Completed/Met Date Met:  07/19/14 Goal: Surgical site without signs of infection Outcome: Completed/Met Date Met:  07/19/14 Goal: Dressings dry/intact Outcome: Completed/Met Date Met:  07/19/14 Goal: Sutures/staples intact Outcome: Not Applicable Date Met:  73/41/93 Goal: Return of bowel function (flatus, BM) IF ABDOMINAL SURGERY:  Outcome: Completed/Met Date Met:  07/19/14 Goal: Foley discontinued Outcome: Completed/Met Date Met:  07/19/14 Goal: Discharge plan established Outcome: Completed/Met Date Met:  07/19/14 Goal: Tolerating diet Outcome: Completed/Met Date Met:  07/19/14 Goal: Other Phase II Outcomes/Goals Outcome: Completed/Met Date Met:  07/19/14  Problem: Phase III Progression Outcomes Goal: Pain controlled on oral analgesia Outcome: Completed/Met Date Met:  07/19/14 Goal: Activity at appropriate level-compared to baseline (UP IN CHAIR FOR HEMODIALYSIS)  Outcome: Completed/Met Date Met:  07/19/14 Goal:  Voiding independently Outcome: Completed/Met Date Met:  07/19/14 Goal: IV changed to normal saline lock Outcome: Completed/Met Date Met:  07/19/14 Goal: Nasogastric tube discontinued Outcome: Completed/Met Date Met:  07/19/14 Goal: Discharge plan remains appropriate-arrangements made Outcome: Completed/Met Date Met:  07/19/14 Goal: Demonstrates TCDB, IS independently Outcome: Completed/Met Date Met:  07/19/14 Goal: Other Phase III Outcomes/Goals Outcome: Completed/Met Date Met:  07/19/14

## 2014-07-20 ENCOUNTER — Telehealth: Payer: Self-pay | Admitting: Cardiology

## 2014-07-20 NOTE — Telephone Encounter (Signed)
Informed the wife [facility]) and the pt that per Dr Meda Coffee we will be glad to see him in the office on Monday 07/23/14 at 2:30 pm.  Pt and wife verbalized understanding and very gracious for the assistance provided.

## 2014-07-20 NOTE — Telephone Encounter (Signed)
New Message  Pt called states that her husband was recently in the hospital. Requests to see Dr. Meda Coffee and Not a PA. No appts open with Dr. Meda Coffee until Dec 17th. Please call back to discuss a work in .

## 2014-07-23 ENCOUNTER — Ambulatory Visit (INDEPENDENT_AMBULATORY_CARE_PROVIDER_SITE_OTHER): Payer: Medicare Other | Admitting: Cardiology

## 2014-07-23 ENCOUNTER — Encounter: Payer: Self-pay | Admitting: Cardiology

## 2014-07-23 VITALS — BP 98/70 | HR 88 | Ht 72.0 in | Wt 200.0 lb

## 2014-07-23 DIAGNOSIS — I714 Abdominal aortic aneurysm, without rupture, unspecified: Secondary | ICD-10-CM

## 2014-07-23 DIAGNOSIS — I480 Paroxysmal atrial fibrillation: Secondary | ICD-10-CM

## 2014-07-23 DIAGNOSIS — I2510 Atherosclerotic heart disease of native coronary artery without angina pectoris: Secondary | ICD-10-CM

## 2014-07-23 DIAGNOSIS — I2583 Coronary atherosclerosis due to lipid rich plaque: Secondary | ICD-10-CM

## 2014-07-23 DIAGNOSIS — I5033 Acute on chronic diastolic (congestive) heart failure: Secondary | ICD-10-CM

## 2014-07-23 DIAGNOSIS — I4891 Unspecified atrial fibrillation: Secondary | ICD-10-CM

## 2014-07-23 HISTORY — DX: Acute on chronic diastolic (congestive) heart failure: I50.33

## 2014-07-23 MED ORDER — AMIODARONE HCL 200 MG PO TABS: 200.0000 mg | ORAL_TABLET | Freq: Every day | ORAL | Status: DC

## 2014-07-23 NOTE — Patient Instructions (Signed)
Your physician has recommended you make the following change in your medication:   Bourbon 20 MG DAILY  CONTINUE TAKING AMIODARONE 200 MG ONCE DAILY     Your physician recommends that you schedule a follow-up appointment in: [place]

## 2014-07-23 NOTE — Progress Notes (Signed)
Patient ID: Joseph [name], male   DOB: 09-29-1940, 73 y.o.   MRN: [id number]    Patient Name: Joseph [name]  Date of Encounter: 09/29/2013  Primary Care Provider: Janalyn Rouse, MD   Primary Cardiologist: Ena Dawley, H   Problem List   Reason for visit: main complain: afib with RVR, SOB  Past Medical History   Diagnosis  Date   .  History of colon polyps  2006,2008   .  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  Hyperlipidemia      takes Pravastatin   .  Emphysema    .  Lung cancer      right upper lobe   .  Enlarged prostate      takes Rapaflo daily   .  Arthritis      left knee and back arthrtis   .  Hypothyroidism      takes Synthroid daily   .  Cataract    .  Hx of radiation therapy  09/02/11 to 10/19/11     chest   .  Diverticulosis    .  Cirrhosis      By radiography, seen on CT   .  Rosacea     Past Surgical History   Procedure  Laterality  Date   .  Hernia repair   70's   .  Vasectomy   70's   .  Finger surgery   2008     left pointer finger   .  Knee arthroscopy   2008     left knee   .  Tonsillectomy       as a child   .  Colonoscopy w/ biopsies       multiple   .  Cardiac catheterization   2003   .  Esophagogastroduodenoscopy   2006   .  Portacath placement   08/10/2011     Procedure: INSERTION PORT-A-CATH; Surgeon: Pierre Bali, MD; Location: [facility] OR; Service: Thoracic; Laterality: Left; Left Subclavian   Allergies  No Known Allergies    HPI  73 year old male with prior medical history of hyperlipidemia, known nonobstructive coronary artery disease was originally referred to [place] for preop evaluation prior to 4.4 cm AAA repair.  The patient states that approximately 10 years ago he was evaluated for coronary artery disease because of abnormal EKG that was followed by abnormal stress test and a cardiac catheterization that only showed minimal atherosclerotic plaque. The patient also has a history of lung cancer for which she has been  treated with chemotherapy and radiation therapy in 2012 and is currently in remission. The patient quit smoking 2 years ago and is being treated for COPD and emphysema with Spiriva.   On 10/31/2013 the patient underwent Aorto to left and right external iliac bypass, left internal iliac artery bypass, ligation right internal iliac aneurysm, reimplantation of inferior mesenteric artery. Thrombectomy of left and right iliac and femoral arteries. This was complicated with respiratory failure in addition to acute blood loss anemia and the patient received 6 units of FFP and 4 units of PRBCs in addition to 2 units of platelets during his admission. He was intubated and sedated for several days. He is feeling much better today and recovered very well. He denied having any significant chest pain, shortness of breath, cough or hemoptysis. He feels overall very tired and hasn't been back to preop baseline yet. He states that post surgery he retained 20 lbs of fluids  but now is bak to baseline. Has some residual minimal LE edema.   He has known hyperlipidemia however he developed liver enzymes elevation after his chemotherapy and is being followed a GI specialist Dr. Charlott Holler, he has never been started on statins. He denies any alcohol intake.  07/23/2014 The patient was hospitalized with SBO, has had eval for both AAA progression and for an incarcerated incisional ventral hernia, s/p resection, mesh and wound vac placement. He was also diagnosed with new onset a-fib with RVR, started on amiodarone, no anticoagulation because of high risk of bleeding (recent surgery, ongoing incision wound, liver cirrhosis).   He was also started on home O2 therapy, coming for a follow up visit 5 days after discharge.   He feels weak, hypotensive at home, BP in 90', has not been taking lasix because of that. No palpitations. No chest pain, ongoing stable SOB after walking few steps. No PND, minimal LE edema.   Home Medications    Prior to Admission medications   Medication  Sig  Start Date  End Date  Taking?  Authorizing Provider   doxycycline (VIBRAMYCIN) 100 MG capsule  Take 100 mg by mouth 2 (two) times daily.    Yes  Historical Provider, MD   FINACEA 15 % cream  Apply 1 application topically as directed.  12/01/12   Yes  Historical Provider, MD   HYDROcodone-acetaminophen (NORCO) 5-325 MG per tablet  Take 1 tablet by mouth every 6 (six) hours as needed for pain.  01/04/12   Yes  Carlton Adam, PA-C   lidocaine-prilocaine (EMLA) cream  Apply 1 application topically as needed.  08/06/11   Yes  Curt Bears, MD   metroNIDAZOLE (METROGEL) 0.75 % gel  Apply 1 application topically 2 (two) times daily.    Yes  Historical Provider, MD   MILK THISTLE PO  Take 1 capsule by mouth daily. Take one 1200 mg capsule daily    Yes  Historical Provider, MD   Multiple Vitamins-Minerals (MULTIVITAMINS THER. W/MINERALS) TABS  Take 1 tablet by mouth daily.    Yes  Historical Provider, MD   nystatin-triamcinolone (MYCOLOG II) cream  Apply 1 application topically daily as needed.  07/10/11   Yes  Historical Provider, MD   OVER THE COUNTER MEDICATION  30 mg 3 (three) times daily. LUNG SUPPORT    Yes  Historical Provider, MD   OVER THE COUNTER MEDICATION  [place] TAIL MUSHROOM Takes 1 gr daily    Yes  Historical Provider, MD   SYNTHROID 175 MCG tablet  Take 175 mcg by mouth daily.  06/08/11   Yes  Stephens Shire, MD   tamsulosin (FLOMAX) 0.4 MG CAPS  Take 0.4 mg by mouth daily.    Yes  Historical Provider, MD   tiotropium (SPIRIVA) 18 MCG inhalation capsule  Place 18 mcg into inhaler and inhale daily.    Yes  Historical Provider, MD    Family History  Family History   Problem  Relation  Age of Onset   .  Anesthesia problems  Neg Hx    .  Hypotension  Neg Hx    .  Malignant hyperthermia  Neg Hx    .  Pseudochol deficiency  Neg Hx    .  Cancer  Father    .  Diabetes  Father    .  Hyperlipidemia  Brother    .  Aneurysm  Brother       Abdominal aortic aneurysm   .  Hyperlipidemia  Mother    .  Hyperlipidemia  Sister    Social History  History    Social History   .  Marital Status:  Married     Spouse Name:  N/A     Number of Children:  N/A   .  Years of Education:  N/A    Occupational History   .  Not on file.    Social History Main Topics   .  Smoking status:  Former Smoker -- 1.50 packs/day for 40 years     Types:  Cigarettes     Quit date:  07/02/2011   .  Smokeless tobacco:  Former Systems developer   .  Alcohol Use:  1.8 oz/week     3 Cans of beer per week      Comment: social   .  Drug Use:  No   .  Sexual Activity:  Not on file    Other Topics  Concern   .  Not on file    Social History Narrative   .  No narrative on file    Review of Systems, as per HPI, otherwise negative  General: No chills, fever, night sweats or weight changes.  Cardiovascular: No chest pain, dyspnea on exertion, edema, orthopnea, palpitations, paroxysmal nocturnal dyspnea.  Dermatological: No rash, lesions/masses  Respiratory: No cough, dyspnea  Urologic: No hematuria, dysuria  Abdominal: No nausea, vomiting, diarrhea, bright red blood per rectum, melena, or hematemesis  Neurologic: No visual changes, wkns, changes in mental status.  All other systems reviewed and are otherwise negative except as noted above.   Physical Exam  Blood pressure 124/66, pulse 94, height 6' (1.829 m), weight 222 lb (100.699 kg).  General: Pleasant, NAD  Psych: Normal affect.  Neuro: Alert and oriented X 3. Moves all extremities spontaneously.  HEENT: Normal  Neck: Supple without bruits or JVD.  Lungs: Resp regular and unlabored, crackles at both bases  Heart: RRR no s3, s4, or murmurs.  Abdomen: Soft, non-tender, non-distended, BS + x 4.  Extremities: No clubbing, cyanosis or edema. DP/PT/Radials 2+ and equal bilaterally.  Labs:  No results found for this basename: CKTOTAL, CKMB, TROPONINI, in the last 72 hours  Lab Results   Component  Value   Date    WBC  5.1  07/17/2013    HGB  14.6  07/17/2013    HCT  43.2  07/17/2013    MCV  99.2*  07/17/2013    PLT  134*  07/17/2013    Accessory Clinical Findings   Echocardiogram - 10/12/2013 - Left ventricle: The cavity size was normal. Systolic function was normal. The estimated ejection fraction was in the range of 55% to 60%. Wall motion was normal; there were no regional wall motion abnormalities. There was an increased relative contribution of atrial contraction to ventricular filling. Doppler parameters are consistent with abnormal left ventricular relaxation (grade 1 diastolic dysfunction). - Aortic valve: Moderate diffuse thickening and calcification, consistent with sclerosis. Trivial regurgitation. - Pulmonary arteries: PA peak pressure: 30mm Hg (S). Impressions:  - The right ventricular systolic pressure was increased consistent with mild pulmonary hypertension  ECG - sinus rhythm, nonspecific ST-T wave abnormalities, abnormal EKG appear     Assessment & Plan   This is a 73 year old male with a history of known nonobstructive CAD and HLP,  post Aorto to left and right external iliac bypass in 3/15, left internal iliac artery bypass, ligation right internal iliac aneurysm, reimplantation of inferior mesenteric artery. Thrombectomy of left  and right iliac and femoral arteries. S/p SBO -> resection, incarcerated ventral hernia with repair , wound vac in place, new onset a-fib with RVR.  1. A-fib with RVR Maintaining NSR. Continue PO amiodarone 200mg  daily , we will send prescription to pharmacy. Continue until the next visit. Lopressor 12.5mg  BID. Not a good anticoagulation candidate with bleeding risk and h/o cirrhosis  2. Incarcirated ventral hernia s/p ex lap - wound vac placed, abdomen has not been close yet. Home nurse visit 3x weekly.  3. 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  He is mildly overloaded today, we will restart lasix 20 mg po daily and follow up in 2 weeks with BMP.  4. Non obstructive CAD - asymptomatic, no statins as prior liver enzymes elevation, restart aspirin, consider betablocker at the next visit  5. COPD - on home O2.   6. Hypothyroidism: started on synthroid  7. 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   Follow up in 2 weeks.   Dorothy Spark, MD, [facility]  09/29/2013, 1:59 PM

## 2014-07-25 ENCOUNTER — Telehealth: Payer: Self-pay | Admitting: Internal Medicine

## 2014-07-25 NOTE — Telephone Encounter (Signed)
returned pt call to sched appt....added appt...and advised on new appt d.t

## 2014-07-31 ENCOUNTER — Encounter: Payer: Self-pay | Admitting: Internal Medicine

## 2014-07-31 ENCOUNTER — Ambulatory Visit (HOSPITAL_BASED_OUTPATIENT_CLINIC_OR_DEPARTMENT_OTHER): Payer: Medicare Other | Admitting: Internal Medicine

## 2014-07-31 ENCOUNTER — Telehealth: Payer: Self-pay | Admitting: Internal Medicine

## 2014-07-31 VITALS — BP 110/66 | HR 77 | Temp 97.6°F | Resp 18 | Ht 72.0 in | Wt 194.5 lb

## 2014-07-31 DIAGNOSIS — C342 Malignant neoplasm of middle lobe, bronchus or lung: Secondary | ICD-10-CM

## 2014-07-31 DIAGNOSIS — R5383 Other fatigue: Secondary | ICD-10-CM

## 2014-07-31 DIAGNOSIS — Z85118 Personal history of other malignant neoplasm of bronchus and lung: Secondary | ICD-10-CM

## 2014-07-31 NOTE — Progress Notes (Signed)
[facility] [phone]   [phone]  OFFICE PROGRESS NOTE  [name], MD [place] [address]  DIAGNOSIS: Stage IIIA/IIIB non-small cell lung cancer   PRIOR THERAPY:  1) Concurrent chemoradiation with chemotherapy in the form of weekly carboplatin for an AUC of 2 and paclitaxel at 45 mg per meter squared.  2) Consolidation chemotherapy with carboplatin for AUC of 5 and paclitaxel 175 mg/M2 every 3 weeks with Neulasta support, status post 3 cycles, last cycle was given on 01/05/2012 with partial response.   CURRENT THERAPY: Observation.  INTERVAL HISTORY: Joseph [name] 73 y.o. male returns to the clinic today for followup visit accompanied by his wife. The patient is feeling fine today with no specific complaints. On 10/31/2013 the patient underwent Aorto to left and right external iliac bypass, left internal iliac artery bypass, ligation right internal iliac aneurysm, reimplantation of inferior mesenteric artery. Thrombectomy of left and right iliac and femoral arteries. He was admitted recently to [facility] with small bowel obstruction and incisional hernia as well as intra-abdominal adhesions. He underwent Exploratory laparotomy with lysis of the adhesion and small bowel obstruction as well as opened repair of the incisional hernia by Dr. Redmond Pulling on November 2015. He still have a wound VAC. He is feeling a little bit better today except for fatigue. He denied having any significant chest pain, shortness of breath, cough or hemoptysis. He denied having any significant weight loss or night sweats. The patient had repeat CT scan of the chest performed recently and he is here for evaluation and discussion of his scan results.  MEDICAL HISTORY: Past Medical History  Diagnosis Date  . History of colon polyps 2006,2008  . 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 Hyperlipidemia     takes Pravastatin  . Emphysema   .  Enlarged prostate     takes Rapaflo daily  . Arthritis     left knee and back arthrtis  . Hypothyroidism     takes Synthroid daily  . Cataract   . Hx of radiation therapy 09/02/11 to 10/19/11    chest  . Diverticulosis   . Cirrhosis     By radiography, seen on CT  . Rosacea   . CAD (coronary artery disease)   . Shortness of breath   . Heart murmur     teen  . Lung cancer     right upper lobe  . Acute on chronic diastolic CHF (congestive heart failure), NYHA class 3 07/23/2014    ALLERGIES:  has No Known Allergies.  MEDICATIONS:  Current Outpatient Prescriptions  Medication Sig Dispense Refill  . amiodarone (PACERONE) 200 MG tablet Take 1 tablet (200 mg total) by mouth daily. 90 tablet 3  . furosemide (LASIX) 20 MG tablet Take 1 tablet (20 mg total) by mouth daily. 90 tablet 3  . Lactobacillus (DIGESTIVE HEALTH PROBIOTIC) CAPS Take 1 capsule by mouth daily. "30 million cultures"    . lidocaine-prilocaine (EMLA) cream Apply 1 application topically as needed.      . metoprolol tartrate (LOPRESSOR) 12.5 mg TABS tablet Take 0.5 tablets (12.5 mg total) by mouth 2 (two) times daily. 30 tablet 0  . metroNIDAZOLE (METROGEL) 0.75 % gel Apply 1 application topically 2 (two) times daily.    . Multiple Vitamins-Minerals (MULTIVITAMINS THER. W/MINERALS) TABS Take 1 tablet by mouth daily.      Marland Kitchen nystatin-triamcinolone (MYCOLOG II) cream Apply 1 application topically daily as  needed. Rash    . OVER THE COUNTER MEDICATION 30 mg 3 (three) times daily. LUNG SUPPORT    . oxyCODONE-acetaminophen (PERCOCET/ROXICET) 5-325 MG per tablet Take 1-2 tablets by mouth every 4 (four) hours as needed for moderate pain. 50 tablet 0  . SYNTHROID 175 MCG tablet Take 175 mcg by mouth daily.     Marland Kitchen tiotropium (SPIRIVA) 18 MCG inhalation capsule Place 18 mcg into inhaler and inhale daily.     No current facility-administered medications for this visit.    SURGICAL HISTORY:  Past Surgical History  Procedure  Laterality Date  . Hernia repair  70's  . Vasectomy  70's  . Finger surgery  2008    left pointer finger  . Knee arthroscopy  2008    left knee  . Tonsillectomy      as a child  . Colonoscopy w/ biopsies      multiple   . Cardiac catheterization  2003  . Esophagogastroduodenoscopy  2006  . Portacath placement  08/10/2011    Procedure: INSERTION PORT-A-CATH;  Surgeon: Pierre Bali, MD;  Location: [place];  Service: Thoracic;  Laterality: Left;  Left Subclavian  . Abdominal aortic aneurysm repair N/A 10/31/2013    Procedure: aorto to left external iliac and right external iliac and left internal iliac, reimplantation of inferior mesenteric artery and ligation of left iliac artery aneursym;  Surgeon: Elam Dutch, MD;  Location: [place];  Service: Vascular;  Laterality: N/A;  . Thrombectomy iliac artery Bilateral 10/31/2013    Procedure: THROMBECTOMY ILIAC ARTERY;  Surgeon: Elam Dutch, MD;  Location: [place];  Service: Vascular;  Laterality: Bilateral;  . Abdominal wound dehiscence N/A 11/10/2013    Procedure: ABDOMINAL WOUND CLOSURE ;  Surgeon: Serafina Mitchell, MD;  Location: [place];  Service: Vascular;  Laterality: N/A;  . Laparotomy N/A 07/10/2014    Procedure: EXPLORATORY LAPAROTOMY;  Surgeon: [name], MD;  Location: [place];  Service: General;  Laterality: N/A;  . Lysis of adhesion N/A 07/10/2014    Procedure: LYSIS OF ADHESION - 90 minutes;  Surgeon: [name], MD;  Location: [facility];  Service: General;  Laterality: N/A;  . Bowel resection N/A 07/10/2014    Procedure: SMALL BOWEL RESECTION;  Surgeon: [name], MD;  Location: [place];  Service: General;  Laterality: N/A;  . Incisional hernia repair N/A 07/10/2014    Procedure: HERNIA REPAIR INCISIONAL;  Surgeon: [name], MD;  Location: [place];  Service: General;  Laterality: N/A;    REVIEW OF SYSTEMS:  A comprehensive review of systems was negative except for: Constitutional: positive for fatigue   PHYSICAL  EXAMINATION: General appearance: alert, cooperative and no distress Head: Normocephalic, without obvious abnormality, atraumatic Neck: no adenopathy Lymph nodes: Cervical, supraclavicular, and axillary nodes normal. Resp: clear to auscultation bilaterally Cardio: regular rate and rhythm, S1, S2 normal, no murmur, click, rub or gallop GI: soft, non-tender; bowel sounds normal; no masses,  no organomegaly Extremities: extremities normal, atraumatic, no cyanosis or edema  ECOG PERFORMANCE STATUS: 1 - Symptomatic but completely ambulatory  Blood pressure 110/66, pulse 77, temperature 97.6 F (36.4 C), temperature source Oral, resp. rate 18, height 6' (1.829 m), weight 194 lb 8 oz (88.225 kg).  LABORATORY DATA: Lab Results  Component Value Date   WBC 5.0 07/18/2014   HGB 9.9* 07/18/2014   HCT 29.7* 07/18/2014   MCV 96.1 07/18/2014   PLT 193 07/18/2014      Chemistry  Component Value Date/Time   NA 138 07/18/2014 0800   NA 143 07/03/2014 1024   K 3.9 07/18/2014 0800   K 3.9 07/03/2014 1024   CL 104 07/18/2014 0800   CL 109* 08/01/2012 1046   CO2 25 07/18/2014 0800   CO2 25 07/03/2014 1024   BUN 6 07/18/2014 0800   BUN 19.4 07/03/2014 1024   CREATININE 0.68 07/18/2014 0800   CREATININE 1.0 07/03/2014 1024   CREATININE 1.00 08/17/2013 1250      Component Value Date/Time   CALCIUM 7.6* 07/18/2014 0800   CALCIUM 9.3 07/03/2014 1024   ALKPHOS 246* 07/18/2014 0800   ALKPHOS 145 07/03/2014 1024   AST 45* 07/18/2014 0800   AST 29 07/03/2014 1024   ALT 47 07/18/2014 0800   ALT 25 07/03/2014 1024   BILITOT 1.0 07/18/2014 0800   BILITOT 1.33* 07/03/2014 1024       RADIOGRAPHIC STUDIES: Dg Chest 2 View  07/15/2014   CLINICAL DATA:  Postoperative fever. History of lung cancer and coronary artery disease. ICD10: R 50.82  EXAM: CHEST  2 VIEW  COMPARISON:  11/05/2013  FINDINGS: A left-sided Port-A-Cath which terminates the mid SVC. A right-sided PICC line terminates at the  low SVC.  Normal heart size. Left hemidiaphragm elevation is moderate. No pleural fluid. 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 There is also medial right apical opacity which is favored to be due to scarring. Lower lobe predominant interstitial thickening. No lobar consolidation. Minimal volume loss adjacent the left hemidiaphragm.  IMPRESSION: COPD/chronic bronchitis with areas of scarring and atelectasis. No lobar consolidation to suggest pneumonia.   Electronically Signed   By: Abigail Miyamoto M.D.   On: 07/15/2014 14:21   Ct Chest W Contrast  07/03/2014   CLINICAL DATA:  CHEST CT ORDERED FOR RESTAGING LUNG CANCER BY DR. Julien Nordmann.  CTA OF THE ABDOMEN AND PELVIS ORDERED BY DR. Oneida Alar FOR TRIPLE A REPAIR FOLLOW-UP.  EXAM: CT CHEST WITH CONTRAST  CT ABDOMEN AND PELVIS ANGIOGRAPHY WITH AND WITHOUT CONTRAST  TECHNIQUE: Multidetector CT imaging of the chest was performed during intravenous contrast administration. Multidetector CT imaging of the abdomen and pelvis was performed following the standard protocol before and during bolus administration of intravenous contrast.  CONTRAST:  172mL OMNIPAQUE IOHEXOL 350 MG/ML SOLN  COMPARISON:  Abdominal CT 09/05/2013.  Chest CT 01/01/2014.  FINDINGS: CT CHEST FINDINGS  THORACIC INLET/BODY WALL:  Porta catheter from the left in stable position.  Atrophic or resected thyroid gland. No supraclavicular or axillary adenopathy.  MEDIASTINUM:  No cardiomegaly or pericardial effusion. Diffuse atherosclerosis, including the coronary arteries. No acute vascular findings.  No suspicious or enlarging mediastinal lymph nodes. Previously measured right juxta diaphragmatic node is smaller at 6 mm short axis. A upper prevascular node is stable at 6 mm short axis.  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 No soft tissue increase or nodular component suggestive  of recurrent disease. Stable subpleural nodule along the right minor fissure measuring 9 mm. No new pulmonary nodule.  Panlobular and centrilobular emphysema. No pneumonia, edema, effusion, or pneumothorax.  UPPER ABDOMEN:  See below  OSSEOUS:  No acute fracture.  No suspicious lytic or blastic lesions.  CT ABDOMEN AND PELVIS FINDINGS  BODY WALL: There is an incisional hernia just above the umbilicus which contains small bowel and results in a partial small bowel obstruction.  Fatty right inguinal hernia  which is small.  Liver: No focal abnormality.  Biliary: No evidence of biliary obstruction or stone.  Pancreas: Unremarkable.  Spleen: Unremarkable.  Adrenals: Unremarkable.  Kidneys and ureters: No hydronephrosis or stone.  Bladder: Unremarkable.  Reproductive: Enlarged prostate, deforming the bladder base. Morphology is stable from previous.  Bowel: There is dilated proximal small bowel leading to a transition point within the above described hernia sac. Some fluid is present within the relatively decompressed distal small bowel, and the colon is not emptied, compatible with partial obstruction. No evidence of bowel devascularization/necrosis. Negative appendix. Extensive distal colonic diverticulosis without active inflammation.  Retroperitoneum: No mass or adenopathy.  Peritoneum: No ascites or pneumoperitoneum.  Vascular: Aortic branching is standard. 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  Status post bypass of an abdominal aortic aneurysm to the bilateral external iliacs and left internal iliac arteries. The graft is widely patent. Reimplanted IMA is widely patent. Branches of the left hypogastric artery are patent. The new maximal infrarenal aortic diameter measures 31 mm. The new maximal left common iliac artery diameter is 23 mm. The new maximal right common iliac artery diameter is 19 mm. These diameters include the collapsed aneurysm sac wall. The  distal left high epigastric artery measures 15 mm in maximal diameter. No concerning periaortic fat infiltration or gas.  OSSEOUS: No acute abnormalities.  These results will be called to the ordering clinician or representative by the Radiologist Assistant, and communication documented in the PACS or zVision Dashboard.  IMPRESSION: 1. Incisional hernia containing small bowel. There is related partial small bowel obstruction. 2. No evidence of recurrent lung cancer. 3. Status post aortic and iliac aneurysm bypass. New baseline anatomy is described above.   Electronically Signed   By: Jorje Guild M.D.   On: 07/03/2014 14:18   Dg Abd 2 Views  07/09/2014   CLINICAL DATA:  Abdominal pain and distension with small bowel obstruction demonstrated on earlier studies  EXAM: ABDOMEN - 2 VIEW  COMPARISON:  Abdominal film of July 07, 2014  FINDINGS: There remain loops of moderately distended gas-filled small bowel throughout the abdomen. The volume of gas has decreased slightly. The esophagogastric tube tip and proximal port lie in the region of the gastric cardia. There is some stool and gas in the rectosigmoid. No free extraluminal gas collections are demonstrated.  IMPRESSION: Persistent small bowel obstruction. There may have been slight interval decrease in the volume of gas present within distended small bowel loops.   Electronically Signed   By: David  Martinique   On: 07/09/2014 09:00   Dg Abd 2 Views  07/07/2014   CLINICAL DATA:  Small bowel obstruction.  EXAM: ABDOMEN - 2 VIEW  COMPARISON:  Abdominal CT from 4 days prior  FINDINGS: Dilated bowel in the central abdomen which has progressed in diameter since 07/03/2014. No definite dilatation of colon. Gastric suction tube terminates in the proximal stomach. No evidence for perforation. No new concerning mass effect.  IMPRESSION: 1. Small bowel obstruction which has progressed since comparison 07/03/2014. 2. Orogastric tube in good position   Electronically  Signed   By: Jorje Guild M.D.   On: 07/07/2014 09:24   Ct Angio Abd/pel W/ And/or W/o  07/03/2014   CLINICAL DATA:  CHEST CT ORDERED FOR RESTAGING LUNG CANCER BY DR. Julien Nordmann.  CTA OF THE ABDOMEN AND PELVIS ORDERED BY DR. Oneida Alar FOR TRIPLE A REPAIR FOLLOW-UP.  EXAM: CT CHEST WITH CONTRAST  CT ABDOMEN  AND PELVIS ANGIOGRAPHY WITH AND WITHOUT CONTRAST  TECHNIQUE: Multidetector CT imaging of the chest was performed during intravenous contrast administration. Multidetector CT imaging of the abdomen and pelvis was performed following the standard protocol before and during bolus administration of intravenous contrast.  CONTRAST:  147mL OMNIPAQUE IOHEXOL 350 MG/ML SOLN  COMPARISON:  Abdominal CT 09/05/2013.  Chest CT 01/01/2014.  FINDINGS: CT CHEST FINDINGS  THORACIC INLET/BODY WALL:  Porta catheter from the left in stable position.  Atrophic or resected thyroid gland. No supraclavicular or axillary adenopathy.  MEDIASTINUM:  No cardiomegaly or pericardial effusion. Diffuse atherosclerosis, including the coronary arteries. No acute vascular findings.  No suspicious or enlarging mediastinal lymph nodes. Previously measured right juxta diaphragmatic node is smaller at 6 mm short axis. A upper prevascular node is stable at 6 mm short axis.  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 No soft tissue increase or nodular component suggestive of recurrent disease. Stable subpleural nodule along the right minor fissure measuring 9 mm. No new pulmonary nodule.  Panlobular and centrilobular emphysema. No pneumonia, edema, effusion, or pneumothorax.  UPPER ABDOMEN:  See below  OSSEOUS:  No acute fracture.  No suspicious lytic or blastic lesions.  CT ABDOMEN AND PELVIS FINDINGS  BODY WALL: There is an incisional hernia just above the umbilicus which contains small bowel and results in a partial small bowel obstruction.  Fatty right inguinal  hernia which is small.  Liver: No focal abnormality.  Biliary: No evidence of biliary obstruction or stone.  Pancreas: Unremarkable.  Spleen: Unremarkable.  Adrenals: Unremarkable.  Kidneys and ureters: No hydronephrosis or stone.  Bladder: Unremarkable.  Reproductive: Enlarged prostate, deforming the bladder base. Morphology is stable from previous.  Bowel: There is dilated proximal small bowel leading to a transition point within the above described hernia sac. Some fluid is present within the relatively decompressed distal small bowel, and the colon is not emptied, compatible with partial obstruction. No evidence of bowel devascularization/necrosis. Negative appendix. Extensive distal colonic diverticulosis without active inflammation.  Retroperitoneum: No mass or adenopathy.  Peritoneum: No ascites or pneumoperitoneum.  Vascular: Aortic branching is standard. 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  Status post bypass of an abdominal aortic aneurysm to the bilateral external iliacs and left internal iliac arteries. The graft is widely patent. Reimplanted IMA is widely patent. Branches of the left hypogastric artery are patent. The new maximal infrarenal aortic diameter measures 31 mm. The new maximal left common iliac artery diameter is 23 mm. The new maximal right common iliac artery diameter is 19 mm. These diameters include the collapsed aneurysm sac wall. The distal left high epigastric artery measures 15 mm in maximal diameter. No concerning periaortic fat infiltration or gas.  OSSEOUS: No acute abnormalities.  These results will be called to the ordering clinician or representative by the Radiologist Assistant, and communication documented in the PACS or zVision Dashboard.  IMPRESSION: 1. Incisional hernia containing small bowel. There is related partial small bowel obstruction. 2. No evidence of recurrent lung cancer. 3. Status post aortic and iliac  aneurysm bypass. New baseline anatomy is described above.   Electronically Signed   By: Jorje Guild M.D.   On: 07/03/2014 14:18   ASSESSMENT AND PLAN: This is a very pleasant 73 years old white male with history of stage IIIA/IIIB non-small cell lung cancer status post concurrent chemoradiation followed by consolidation chemotherapy  and he has been observation since May of 2013 with no evidence for disease progression. The patient is doing very well except for the recent surgical operation and recovery with consistent fatigue. I discussed the scan results with the patient and his wife. I recommended for him to continue on observation with repeat CT scan of the chest in 6 months.   The patient was advised to call immediately if he has any concerning symptoms in the interval. All questions were answered. The patient knows to call the clinic with any problems, questions or concerns. We can certainly see the patient much sooner if necessary.  Disclaimer: This note was dictated with voice recognition software. Similar sounding words can inadvertently be transcribed and may not be corrected upon review.

## 2014-07-31 NOTE — Telephone Encounter (Signed)
gv adn printed appt sched and avs for pt for Jan, March and May

## 2014-08-02 ENCOUNTER — Telehealth: Payer: Self-pay | Admitting: Cardiology

## 2014-08-02 NOTE — Telephone Encounter (Signed)
New message      Talk to [facility]

## 2014-08-02 NOTE — Telephone Encounter (Signed)
Contacted the pt and informed him that per Dr Meda Coffee its ok to scheduled that lab appt for 3 months out.  Scheduled lab appt for 11/01/14 at pts request.  Order linked for BNP check.  Pt verbalized understanding and agrees with this plan.

## 2014-08-02 NOTE — Telephone Encounter (Signed)
Pt calling to ask Dr Meda Coffee if he still has to come in for his lab appt on 08/07/14, for this was made back in the summer, and he already has an appt to come in and see Estella Husk on 12-9 for a est post hospital visit, and then a 2 week follow-up appt with Dr Meda Coffee on 12/17.  Pt states he is trying to avoid coming to the office that many times if possible.  Informed the pt that Dr Meda Coffee is out of the office today, but I will route this message to her for further review and recommendation and follow-up thereafter.  Pt verbalized understanding and agrees with this plan.

## 2014-08-02 NOTE — Telephone Encounter (Signed)
That's reasonable, schedule a visit in 3 months

## 2014-08-06 ENCOUNTER — Ambulatory Visit (INDEPENDENT_AMBULATORY_CARE_PROVIDER_SITE_OTHER): Payer: Medicare Other | Admitting: Cardiology

## 2014-08-06 ENCOUNTER — Encounter: Payer: Self-pay | Admitting: Cardiology

## 2014-08-06 VITALS — BP 132/64 | HR 77 | Ht 72.0 in | Wt 193.0 lb

## 2014-08-06 DIAGNOSIS — I5033 Acute on chronic diastolic (congestive) heart failure: Secondary | ICD-10-CM

## 2014-08-06 DIAGNOSIS — I480 Paroxysmal atrial fibrillation: Secondary | ICD-10-CM

## 2014-08-06 DIAGNOSIS — E876 Hypokalemia: Secondary | ICD-10-CM

## 2014-08-06 DIAGNOSIS — E785 Hyperlipidemia, unspecified: Secondary | ICD-10-CM

## 2014-08-06 LAB — BASIC METABOLIC PANEL
BUN: 13 mg/dL (ref 6–23)
CO2: 27 mEq/L (ref 19–32)
Calcium: 8.6 mg/dL (ref 8.4–10.5)
Chloride: 107 mEq/L (ref 96–112)
Creatinine, Ser: 1 mg/dL (ref 0.4–1.5)
GFR: 75.53 mL/min (ref >60.00)
Glucose, Bld: 87 mg/dL (ref 70–99)
Potassium: 4 mEq/L (ref 3.5–5.1)
Sodium: 139 mEq/L (ref 135–145)

## 2014-08-06 NOTE — Patient Instructions (Signed)
Your physician recommends that you continue on your current medications as directed. Please refer to the Current Medication list given to you today.   Your physician recommends that you return for lab work in: [place])     Your physician recommends that you schedule a follow-up appointment in: [place]

## 2014-08-06 NOTE — Progress Notes (Signed)
Patient ID: Joseph [name], male   DOB: 08-Feb-1941, 73 y.o.   MRN: [id number]    Patient Name: Joseph [name]  Date of Encounter: 09/29/2013  Primary Care Provider: Janalyn Rouse, MD   Primary Cardiologist: Ena Dawley, H   Problem List   Reason for visit: main complain: afib with RVR, SOB  Past Medical History   Diagnosis  Date   .  History of colon polyps  2006,2008   .  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  Hyperlipidemia      takes Pravastatin   .  Emphysema    .  Lung cancer      right upper lobe   .  Enlarged prostate      takes Rapaflo daily   .  Arthritis      left knee and back arthrtis   .  Hypothyroidism      takes Synthroid daily   .  Cataract    .  Hx of radiation therapy  09/02/11 to 10/19/11     chest   .  Diverticulosis    .  Cirrhosis      By radiography, seen on CT   .  Rosacea     Past Surgical History   Procedure  Laterality  Date   .  Hernia repair   70's   .  Vasectomy   70's   .  Finger surgery   2008     left pointer finger   .  Knee arthroscopy   2008     left knee   .  Tonsillectomy       as a child   .  Colonoscopy w/ biopsies       multiple   .  Cardiac catheterization   2003   .  Esophagogastroduodenoscopy   2006   .  Portacath placement   08/10/2011     Procedure: INSERTION PORT-A-CATH; Surgeon: Pierre Bali, MD; Location: [facility] OR; Service: Thoracic; Laterality: Left; Left Subclavian   Allergies  No Known Allergies    HPI  73 year old male with prior medical history of hyperlipidemia, known nonobstructive coronary artery disease was originally referred to [place] for preop evaluation prior to 4.4 cm AAA repair.  The patient states that approximately 10 years ago he was evaluated for coronary artery disease because of abnormal EKG that was followed by abnormal stress test and a cardiac catheterization that only showed minimal atherosclerotic plaque. The patient also has a history of lung cancer for which she has been  treated with chemotherapy and radiation therapy in 2012 and is currently in remission. The patient quit smoking 2 years ago and is being treated for COPD and emphysema with Spiriva.   On 10/31/2013 the patient underwent Aorto to left and right external iliac bypass, left internal iliac artery bypass, ligation right internal iliac aneurysm, reimplantation of inferior mesenteric artery. Thrombectomy of left and right iliac and femoral arteries. This was complicated with respiratory failure in addition to acute blood loss anemia and the patient received 6 units of FFP and 4 units of PRBCs in addition to 2 units of platelets during his admission. He was intubated and sedated for several days. He is feeling much better today and recovered very well. He denied having any significant chest pain, shortness of breath, cough or hemoptysis. He feels overall very tired and hasn't been back to preop baseline yet. He states that post surgery he retained 20 lbs of fluids  but now is bak to baseline. Has some residual minimal LE edema.   He has known hyperlipidemia however he developed liver enzymes elevation after his chemotherapy and is being followed a GI specialist Dr. Charlott Holler, he has never been started on statins. He denies any alcohol intake.  07/23/2014 The patient was hospitalized with SBO, has had eval for both AAA progression and for an incarcerated incisional ventral hernia, s/p resection, mesh and wound vac placement. He was also diagnosed with new onset a-fib with RVR, started on amiodarone, no anticoagulation because of high risk of bleeding (recent surgery, ongoing incision wound, liver cirrhosis).  He was also started on home O2 therapy, coming for a follow up visit 5 days after discharge.  He feels weak, hypotensive at home, BP in 90', has not been taking lasix because of that. No palpitations. No chest pain, ongoing stable SOB after walking few steps. No PND, minimal LE edema.   08/06/2014 - the  patient is coming for 2 week follow-up for acute on chronic CHF with preserved ejection fraction and chronic atrial fibrillation. The patient states that his lower extremity edema has significantly improved after we started Lasix 20 mg daily at the last visit. Some days he forgets to use it anyway it still improved. His breathing has also slightly improved. He still continues to wear wound VAC that might be taken away today. He continues to take amiodarone.    Home Medications  Prior to Admission medications   Medication  Sig  Start Date  End Date  Taking?  Authorizing Provider   doxycycline (VIBRAMYCIN) 100 MG capsule  Take 100 mg by mouth 2 (two) times daily.    Yes  Historical Provider, MD   FINACEA 15 % cream  Apply 1 application topically as directed.  12/01/12   Yes  Historical Provider, MD   HYDROcodone-acetaminophen (NORCO) 5-325 MG per tablet  Take 1 tablet by mouth every 6 (six) hours as needed for pain.  01/04/12   Yes  Carlton Adam, PA-C   lidocaine-prilocaine (EMLA) cream  Apply 1 application topically as needed.  08/06/11   Yes  Curt Bears, MD   metroNIDAZOLE (METROGEL) 0.75 % gel  Apply 1 application topically 2 (two) times daily.    Yes  Historical Provider, MD   MILK THISTLE PO  Take 1 capsule by mouth daily. Take one 1200 mg capsule daily    Yes  Historical Provider, MD   Multiple Vitamins-Minerals (MULTIVITAMINS THER. W/MINERALS) TABS  Take 1 tablet by mouth daily.    Yes  Historical Provider, MD   nystatin-triamcinolone (MYCOLOG II) cream  Apply 1 application topically daily as needed.  07/10/11   Yes  Historical Provider, MD   OVER THE COUNTER MEDICATION  30 mg 3 (three) times daily. LUNG SUPPORT    Yes  Historical Provider, MD   OVER THE COUNTER MEDICATION  [place] TAIL MUSHROOM Takes 1 gr daily    Yes  Historical Provider, MD   SYNTHROID 175 MCG tablet  Take 175 mcg by mouth daily.  06/08/11   Yes  Stephens Shire, MD   tamsulosin (FLOMAX) 0.4 MG CAPS  Take 0.4 mg by mouth  daily.    Yes  Historical Provider, MD   tiotropium (SPIRIVA) 18 MCG inhalation capsule  Place 18 mcg into inhaler and inhale daily.    Yes  Historical Provider, MD    Family History  Family History   Problem  Relation  Age of Onset   .  Anesthesia problems  Neg Hx    .  Hypotension  Neg Hx    .  Malignant hyperthermia  Neg Hx    .  Pseudochol deficiency  Neg Hx    .  Cancer  Father    .  Diabetes  Father    .  Hyperlipidemia  Brother    .  Aneurysm  Brother      Abdominal aortic aneurysm   .  Hyperlipidemia  Mother    .  Hyperlipidemia  Sister    Social History  History    Social History   .  Marital Status:  Married     Spouse Name:  N/A     Number of Children:  N/A   .  Years of Education:  N/A    Occupational History   .  Not on file.    Social History Main Topics   .  Smoking status:  Former Smoker -- 1.50 packs/day for 40 years     Types:  Cigarettes     Quit date:  07/02/2011   .  Smokeless tobacco:  Former Systems developer   .  Alcohol Use:  1.8 oz/week     3 Cans of beer per week      Comment: social   .  Drug Use:  No   .  Sexual Activity:  Not on file    Other Topics  Concern   .  Not on file    Social History Narrative   .  No narrative on file    Review of Systems, as per HPI, otherwise negative  General: No chills, fever, night sweats or weight changes.  Cardiovascular: No chest pain, dyspnea on exertion, edema, orthopnea, palpitations, paroxysmal nocturnal dyspnea.  Dermatological: No rash, lesions/masses  Respiratory: No cough, dyspnea  Urologic: No hematuria, dysuria  Abdominal: No nausea, vomiting, diarrhea, bright red blood per rectum, melena, or hematemesis  Neurologic: No visual changes, wkns, changes in mental status.  All other systems reviewed and are otherwise negative except as noted above.   Physical Exam  Blood pressure 124/66, pulse 94, height 6' (1.829 m), weight 222 lb (100.699 kg).  General: Pleasant, NAD  Psych: Normal affect.  Neuro:  Alert and oriented X 3. Moves all extremities spontaneously.  HEENT: Normal  Neck: Supple without bruits or JVD.  Lungs: Resp regular and unlabored, crackles at both bases  Heart: RRR no s3, s4, or murmurs.  Abdomen: Soft, non-tender, non-distended, BS + x 4.  Extremities: No clubbing, cyanosis, mild B/L pitting edema up to mid calves. DP/PT/Radials 2+ and equal bilaterally.   Labs:  No results found for this basename: CKTOTAL, CKMB, TROPONINI, in the last 72 hours  Lab Results   Component  Value  Date    WBC  5.1  07/17/2013    HGB  14.6  07/17/2013    HCT  43.2  07/17/2013    MCV  99.2*  07/17/2013    PLT  134*  07/17/2013    Accessory Clinical Findings   Echocardiogram - 10/12/2013 - Left ventricle: The cavity size was normal. Systolic function was normal. The estimated ejection fraction was in the range of 55% to 60%. Wall motion was normal; there were no regional wall motion abnormalities. There was an increased relative contribution of atrial contraction to ventricular filling. Doppler parameters are consistent with abnormal left ventricular relaxation (grade 1 diastolic dysfunction). - Aortic valve: Moderate diffuse thickening and calcification, consistent with sclerosis. Trivial regurgitation. - Pulmonary  arteries: PA peak pressure: 98mm Hg (S). Impressions:  - The right ventricular systolic pressure was increased consistent with mild pulmonary hypertension  ECG - sinus rhythm, nonspecific ST-T wave abnormalities, abnormal EKG appear     Assessment & Plan   This is a 73 year old male with a history of known nonobstructive CAD and HLP,  post Aorto to left and right external iliac bypass in 3/15, left internal iliac artery bypass, ligation right internal iliac aneurysm, reimplantation of inferior mesenteric artery. Thrombectomy of left and right iliac and femoral arteries. S/p SBO -> resection, incarcerated ventral hernia with repair , wound vac in place, new onset  a-fib with RVR.  1. A-fib with RVR Maintaining NSR. Continue PO amiodarone 200mg  daily until the next visit. The patient would like to stop, we will reevaluate at next visit. Continue Lopressor 12.5 mg BID. Not a good anticoagulation candidate with bleeding risk and h/o cirrhosis  2. Incarcirated ventral hernia s/p ex lap - wound vac placed, abdomen has not been close yet. Home nurse visit 3x weekly.  3. 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 He is almost euvolemic with minimal lower extremity edema on 20 mg of Lasix daily. He wasn't completely compliant anyway it still helped him. We will check basic metabolic profile today.   4. Non obstructive CAD - asymptomatic, no statins as prior liver enzymes elevation, restart aspirin, consider betablocker at the next visit  5. COPD - on home O2.   6. Hypothyroidism: started on synthroid  7. 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   Follow up in 2 months.    Dorothy Spark, MD, [facility]  09/29/2013, 1:59 PM

## 2014-08-07 ENCOUNTER — Other Ambulatory Visit: Payer: Medicare Other

## 2014-08-08 ENCOUNTER — Encounter: Payer: Medicare Other | Admitting: Physician Assistant

## 2014-08-09 ENCOUNTER — Encounter (HOSPITAL_COMMUNITY): Payer: Self-pay | Admitting: Vascular Surgery

## 2014-08-16 ENCOUNTER — Ambulatory Visit: Payer: Medicare Other | Admitting: Cardiology

## 2014-09-05 ENCOUNTER — Encounter: Payer: Self-pay | Admitting: Cardiology

## 2014-09-11 ENCOUNTER — Ambulatory Visit (HOSPITAL_BASED_OUTPATIENT_CLINIC_OR_DEPARTMENT_OTHER): Payer: Medicare Other

## 2014-09-11 VITALS — BP 129/77 | HR 79 | Temp 97.7°F

## 2014-09-11 DIAGNOSIS — C3411 Malignant neoplasm of upper lobe, right bronchus or lung: Secondary | ICD-10-CM

## 2014-09-11 DIAGNOSIS — Z452 Encounter for adjustment and management of vascular access device: Secondary | ICD-10-CM

## 2014-09-11 DIAGNOSIS — Z95828 Presence of other vascular implants and grafts: Secondary | ICD-10-CM

## 2014-09-11 MED ORDER — HEPARIN SOD (PORK) LOCK FLUSH 100 UNIT/ML IV SOLN
500.0000 [IU] | Freq: Once | INTRAVENOUS | Status: AC
  Administered 2014-09-11: 500 [IU] via INTRAVENOUS
  Filled 2014-09-11: qty 5

## 2014-09-11 MED ORDER — SODIUM CHLORIDE 0.9 % IJ SOLN
10.0000 mL | INTRAMUSCULAR | Status: DC | PRN
  Administered 2014-09-11: 10 mL via INTRAVENOUS
  Filled 2014-09-11: qty 10

## 2014-09-11 NOTE — Patient Instructions (Signed)

## 2014-10-24 ENCOUNTER — Ambulatory Visit (INDEPENDENT_AMBULATORY_CARE_PROVIDER_SITE_OTHER): Payer: Medicare Other | Admitting: Cardiology

## 2014-10-24 ENCOUNTER — Encounter: Payer: Self-pay | Admitting: Cardiology

## 2014-10-24 VITALS — BP 122/76 | HR 79 | Ht 72.0 in | Wt 210.4 lb

## 2014-10-24 DIAGNOSIS — I5033 Acute on chronic diastolic (congestive) heart failure: Secondary | ICD-10-CM

## 2014-10-24 DIAGNOSIS — I4891 Unspecified atrial fibrillation: Secondary | ICD-10-CM

## 2014-10-24 DIAGNOSIS — E785 Hyperlipidemia, unspecified: Secondary | ICD-10-CM

## 2014-10-24 DIAGNOSIS — E039 Hypothyroidism, unspecified: Secondary | ICD-10-CM

## 2014-10-24 DIAGNOSIS — I10 Essential (primary) hypertension: Secondary | ICD-10-CM

## 2014-10-24 LAB — HEPATIC FUNCTION PANEL
ALT: 20 U/L (ref 0–53)
AST: 33 U/L (ref 0–37)
Albumin: 3.8 g/dL (ref 3.5–5.2)
Alkaline Phosphatase: 154 U/L — ABNORMAL HIGH (ref 39–117)
Bilirubin, Direct: 0.1 mg/dL (ref 0.0–0.3)
Total Bilirubin: 0.8 mg/dL (ref 0.2–1.2)
Total Protein: 7.4 g/dL (ref 6.0–8.3)

## 2014-10-24 LAB — COMPREHENSIVE METABOLIC PANEL
ALT: 20 U/L (ref 0–53)
AST: 33 U/L (ref 0–37)
Albumin: 3.8 g/dL (ref 3.5–5.2)
Alkaline Phosphatase: 154 U/L — ABNORMAL HIGH (ref 39–117)
BUN: 14 mg/dL (ref 6–23)
CO2: 26 mEq/L (ref 19–32)
Calcium: 9.2 mg/dL (ref 8.4–10.5)
Chloride: 107 mEq/L (ref 96–112)
Creatinine, Ser: 0.87 mg/dL (ref 0.40–1.50)
GFR: 91.72 mL/min (ref >60.00)
Glucose, Bld: 78 mg/dL (ref 70–99)
Potassium: 4.7 mEq/L (ref 3.5–5.1)
Sodium: 137 mEq/L (ref 135–145)
Total Bilirubin: 0.8 mg/dL (ref 0.2–1.2)
Total Protein: 7.4 g/dL (ref 6.0–8.3)

## 2014-10-24 LAB — TSH: TSH: 3.46 u[IU]/mL (ref 0.35–4.50)

## 2014-10-24 MED ORDER — METOPROLOL TARTRATE 25 MG PO TABS: 25.0000 mg | ORAL_TABLET | Freq: Two times a day (BID) | ORAL | Status: DC

## 2014-10-24 NOTE — Patient Instructions (Signed)
Your physician has recommended you make the following change in your medication:    STOP TAKING AMIODARONE  START TAKING METOPROLOL 25 MG TWICE DAILY     Your physician recommends that you return for lab work in: TODAY ---CMET, TSH, NMR W LIPIDS     Your physician recommends that you schedule a follow-up appointment in: [place]

## 2014-10-24 NOTE — Progress Notes (Signed)
Patient ID: Joseph [name], male   DOB: Mar 10, 1941, 74 y.o.   MRN: [id number]    Patient Name: Joseph [name]  Date of Encounter: 09/29/2013  Primary Care Provider: Janalyn Rouse, MD   Primary Cardiologist: Ena Dawley, H   Problem List   Reason for visit: main complain: afib with RVR, SOB  Past Medical History   Diagnosis  Date   .  History of colon polyps  2006,2008   .  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  Hyperlipidemia      takes Pravastatin   .  Emphysema    .  Lung cancer      right upper lobe   .  Enlarged prostate      takes Rapaflo daily   .  Arthritis      left knee and back arthrtis   .  Hypothyroidism      takes Synthroid daily   .  Cataract    .  Hx of radiation therapy  09/02/11 to 10/19/11     chest   .  Diverticulosis    .  Cirrhosis      By radiography, seen on CT   .  Rosacea     Past Surgical History   Procedure  Laterality  Date   .  Hernia repair   70's   .  Vasectomy   70's   .  Finger surgery   2008     left pointer finger   .  Knee arthroscopy   2008     left knee   .  Tonsillectomy       as a child   .  Colonoscopy w/ biopsies       multiple   .  Cardiac catheterization   2003   .  Esophagogastroduodenoscopy   2006   .  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  The patient states that approximately 10 years ago he was evaluated for coronary artery disease because of abnormal EKG that was followed by abnormal stress test and a cardiac catheterization that only showed minimal atherosclerotic plaque. The patient also has a history of lung cancer for which she has been  treated with chemotherapy and radiation therapy in 2012 and is currently in remission. The patient quit smoking 2 years ago and is being treated for COPD and emphysema with Spiriva.   On 10/31/2013 the patient underwent Aorto to left and right external iliac bypass, left internal iliac artery bypass, ligation right internal iliac aneurysm, reimplantation of inferior mesenteric artery. Thrombectomy of left and right iliac and femoral arteries. This was complicated with respiratory failure in addition to acute blood loss anemia and the patient received 6 units of FFP and 4 units of PRBCs in addition to 2 units of platelets during his admission. He was intubated and sedated for several days. He is feeling much better today and recovered very well. He denied having any significant chest pain, shortness of breath, cough or hemoptysis. He feels overall very tired and hasn't been back to preop baseline yet. He states that post surgery he retained 20 lbs of fluids  but now is bak to baseline. Has some residual minimal LE edema.   He has known hyperlipidemia however he developed liver enzymes elevation after his chemotherapy and is being followed a GI specialist Dr. Charlott Holler, he has never been started on statins. He denies any alcohol intake.  07/23/2014 The patient was hospitalized with SBO, has had eval for both AAA progression and for an incarcerated incisional ventral hernia, s/p resection, mesh and wound vac placement. He was also diagnosed with new onset a-fib with RVR, started on amiodarone, no anticoagulation because of high risk of bleeding (recent surgery, ongoing incision wound, liver cirrhosis).  He was also started on home O2 therapy, coming for a follow up visit 5 days after discharge.  He feels weak, hypotensive at home, BP in 90', has not been taking lasix because of that. No palpitations. No chest pain, ongoing stable SOB after walking few steps. No PND, minimal LE edema.   08/06/2014 - the  patient is coming for 2 week follow-up for acute on chronic CHF with preserved ejection fraction and chronic atrial fibrillation. The patient states that his lower extremity edema has significantly improved after we started Lasix 20 mg daily at the last visit. Some days he forgets to use it anyway it still improved. His breathing has also slightly improved. He still continues to wear wound VAC that might be taken away today. He continues to take amiodarone.   10/24/2014 - the patient is coming after 2 months, he has been feeling better with improved SOB, no CP, no palpitations. No LE edema, no syncope.   Home Medications  Prior to Admission medications   Medication  Sig  Start Date  End Date  Taking?  Authorizing Provider   doxycycline (VIBRAMYCIN) 100 MG capsule  Take 100 mg by mouth 2 (two) times daily.    Yes  Historical Provider, MD   FINACEA 15 % cream  Apply 1 application topically as directed.  12/01/12   Yes  Historical Provider, MD   HYDROcodone-acetaminophen (NORCO) 5-325 MG per tablet  Take 1 tablet by mouth every 6 (six) hours as needed for pain.  01/04/12   Yes  Carlton Adam, PA-C   lidocaine-prilocaine (EMLA) cream  Apply 1 application topically as needed.  08/06/11   Yes  Curt Bears, MD   metroNIDAZOLE (METROGEL) 0.75 % gel  Apply 1 application topically 2 (two) times daily.    Yes  Historical Provider, MD   MILK THISTLE PO  Take 1 capsule by mouth daily. Take one 1200 mg capsule daily    Yes  Historical Provider, MD   Multiple Vitamins-Minerals (MULTIVITAMINS THER. W/MINERALS) TABS  Take 1 tablet by mouth daily.    Yes  Historical Provider, MD   nystatin-triamcinolone (MYCOLOG II) cream  Apply 1 application topically daily as needed.  07/10/11   Yes  Historical Provider, MD   OVER THE COUNTER MEDICATION  30 mg 3 (three) times daily. LUNG SUPPORT    Yes  Historical Provider, MD   OVER THE COUNTER MEDICATION  [place] TAIL MUSHROOM Takes 1 gr daily    Yes  Historical Provider, MD     SYNTHROID 175 MCG tablet  Take 175 mcg by mouth daily.  06/08/11   Yes  Stephens Shire, MD   tamsulosin (FLOMAX) 0.4 MG CAPS  Take 0.4 mg by mouth daily.    Yes  Historical Provider, MD   tiotropium (SPIRIVA) 18 MCG inhalation capsule  Place 18 mcg into inhaler and inhale daily.  Yes  Historical Provider, MD    Family History  Family History   Problem  Relation  Age of Onset   .  Anesthesia problems  Neg Hx    .  Hypotension  Neg Hx    .  Malignant hyperthermia  Neg Hx    .  Pseudochol deficiency  Neg Hx    .  Cancer  Father    .  Diabetes  Father    .  Hyperlipidemia  Brother    .  Aneurysm  Brother      Abdominal aortic aneurysm   .  Hyperlipidemia  Mother    .  Hyperlipidemia  Sister    Social History  History    Social History   .  Marital Status:  Married     Spouse Name:  N/A     Number of Children:  N/A   .  Years of Education:  N/A    Occupational History   .  Not on file.    Social History Main Topics   .  Smoking status:  Former Smoker -- 1.50 packs/day for 40 years     Types:  Cigarettes     Quit date:  07/02/2011   .  Smokeless tobacco:  Former Systems developer   .  Alcohol Use:  1.8 oz/week     3 Cans of beer per week      Comment: social   .  Drug Use:  No   .  Sexual Activity:  Not on file    Other Topics  Concern   .  Not on file    Social History Narrative   .  No narrative on file    Review of Systems, as per HPI, otherwise negative  General: No chills, fever, night sweats or weight changes.  Cardiovascular: No chest pain, dyspnea on exertion, edema, orthopnea, palpitations, paroxysmal nocturnal dyspnea.  Dermatological: No rash, lesions/masses  Respiratory: No cough, dyspnea  Urologic: No hematuria, dysuria  Abdominal: No nausea, vomiting, diarrhea, bright red blood per rectum, melena, or hematemesis  Neurologic: No visual changes, wkns, changes in mental status.  All other systems reviewed and are otherwise negative except as noted above.    Physical Exam  Blood pressure 124/66, pulse 94, height 6' (1.829 m), weight 222 lb (100.699 kg).  General: Pleasant, NAD  Psych: Normal affect.  Neuro: Alert and oriented X 3. Moves all extremities spontaneously.  HEENT: Normal  Neck: Supple without bruits or JVD.  Lungs: Resp regular and unlabored, crackles at both bases  Heart: RRR no s3, s4, or murmurs.  Abdomen: Soft, non-tender, non-distended, BS + x 4.  Extremities: No clubbing, cyanosis, mild B/L pitting edema up to mid calves. DP/PT/Radials 2+ and equal bilaterally.   Labs:  No results found for this basename: CKTOTAL, CKMB, TROPONINI, in the last 72 hours  Lab Results   Component  Value  Date    WBC  5.1  07/17/2013    HGB  14.6  07/17/2013    HCT  43.2  07/17/2013    MCV  99.2*  07/17/2013    PLT  134*  07/17/2013    Accessory Clinical Findings   Echocardiogram - 10/12/2013 - Left ventricle: The cavity size was normal. Systolic function was normal. The estimated ejection fraction was in the range of 55% to 60%. Wall motion was normal; there were no regional wall motion abnormalities. There was an increased relative contribution of atrial contraction to ventricular filling. Doppler parameters  are consistent with abnormal left ventricular relaxation (grade 1 diastolic dysfunction). - Aortic valve: Moderate diffuse thickening and calcification, consistent with sclerosis. Trivial regurgitation. - Pulmonary arteries: PA peak pressure: 29mm Hg (S). Impressions:  - The right ventricular systolic pressure was increased consistent with mild pulmonary hypertension  ECG - sinus rhythm, nonspecific ST-T wave abnormalities, abnormal EKG appear     Assessment & Plan   This is a 74 year old male with a history of known nonobstructive CAD and HLP,  post Aorto to left and right external iliac bypass in 3/15, left internal iliac artery bypass, ligation right internal iliac aneurysm, reimplantation of inferior mesenteric  artery. Thrombectomy of left and right iliac and femoral arteries. S/p SBO -> resection, incarcerated ventral hernia with repair , wound vac in place, new onset a-fib with RVR.  1. A-fib with RVR Maintaining NSR. We will discontinue amiodarone, start metoprolol 25 mg po BID,  Not a good anticoagulation candidate with bleeding risk and h/o cirrhosis  2. 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 He is almost euvolemic with no lower extremity edema on 20 mg of Lasix daily. He wasn't completely compliant anyway it still helped him. We will check basic metabolic profile today.   3. Incarcirated ventral hernia s/p ex lap - wound vac placed, abdomen has not been close yet. Home nurse visit 3x weekly.  4. Non obstructive CAD - asymptomatic, no statins as prior liver enzymes elevation, restart aspirin, consider betablocker at the next visit  5. COPD - on home O2.   6. Hypothyroidism: started on synthroid  7. 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   Follow up in 2 months.    Dorothy Spark, MD, [facility]  09/29/2013, 1:59 PM

## 2014-10-26 ENCOUNTER — Telehealth: Payer: Self-pay | Admitting: Cardiology

## 2014-10-26 LAB — NMR LIPOPROFILE WITH LIPIDS
Cholesterol, Total: 188 mg/dL (ref 100–199)
HDL Particle Number: 19.8 umol/L — ABNORMAL LOW (ref >=30.5)
HDL Size: 8.9 nm — ABNORMAL LOW (ref >=9.2)
HDL-C: 35 mg/dL — ABNORMAL LOW (ref >39)
LDL (calc): 114 mg/dL — ABNORMAL HIGH (ref 0–99)
LDL Particle Number: 1430 nmol/L — ABNORMAL HIGH (ref <1000)
LDL Size: 21.5 nm (ref >=20.8)
LP-IR Score: 45 (ref <=45)
Large HDL-P: 2.9 umol/L — ABNORMAL LOW (ref >=4.8)
Large VLDL-P: 2.9 nmol/L — ABNORMAL HIGH (ref <=2.7)
Small LDL Particle Number: 206 nmol/L (ref <=527)
Triglycerides: 195 mg/dL — ABNORMAL HIGH (ref 0–149)
VLDL Size: 43 nm (ref <=46.6)

## 2014-10-26 NOTE — Telephone Encounter (Signed)
New Message   Pt c/o medication issue:  1. Name of Medication: Metoprolol   2. How are you currently taking this medication (dosage and times per day)? 25 mg 2/day NOW, before- half tab 2/day  3. Are you having a reaction (difficulty breathing--STAT)? no  4. What is your medication issue? Wanting to confirm dosage

## 2014-10-26 NOTE — Telephone Encounter (Signed)
Called and spoke with pt and he stated that his concern was that he was on half tabs of the Metoprolol before and that his blood pressures stayed low and he was afraid that taking the increased dose would drop his blood pressure lower. Informed pt that the increase was likely due to the discontinuing of the Amiodarone. Informed pt to check his BP daily and call our office if his BP got too low or he felt dizzy or lightheaded. Pt asked that I please discuss this issue with Dr. Meda Coffee for advisement because he is unable to check his BP at home. Informed pt that I would do as requested and give him a call back as Dr. Meda Coffee is in the office today. Spoke with Dr. Meda Coffee and she stated the reason for the increase was because pt was taken off of Amiodarone and to tell pt that if he has dizziness from medication to decrease it back to Metoprolol 12.5mg  BID. Called pt and informed him of new instructions from Dr. Meda Coffee. Informed pt that if he does have dizziness from medication to call our office and make [place] aware of this. Pt verbalized understanding and was in agreement with this plan.

## 2014-11-01 ENCOUNTER — Other Ambulatory Visit: Payer: Medicare Other

## 2014-11-02 ENCOUNTER — Ambulatory Visit (INDEPENDENT_AMBULATORY_CARE_PROVIDER_SITE_OTHER): Payer: Medicare Other | Admitting: Pharmacist

## 2014-11-02 DIAGNOSIS — E785 Hyperlipidemia, unspecified: Secondary | ICD-10-CM

## 2014-11-02 MED ORDER — PRAVASTATIN SODIUM 10 MG PO TABS: 10.0000 mg | ORAL_TABLET | Freq: Every day | ORAL | Status: DC

## 2014-11-02 NOTE — Progress Notes (Signed)
Patient is a pleasant 74 y.o. WM referred to [facility] by Dr. Meda Coffee given his h/o AAA and non-obstructive disease which is complicated by possible cirrhosis.  Patient's liver function have been stable lately, and historically only his ALK Phos and bilirubin have been slightly elevated.  ALT and AST have been relatively normal historically, but slightly elevated in 07/2013.  He had a CT scan which showed his liver margins were slightly irregular and he has seen Dr. Carlean Purl (GI) for this.  Dr. Carlean Purl was consulted on using a statin in patient, and Dr. Carlean Purl felt it would be okay to do so at this time.  His LFTs became slightly elevated after being treated by oncology for lung cancer - no recurrence of cancer since 2012.  Patient was treated with pravastatin in 2012 for 1 year, but this was ultimately stopped. No known reason for stopping pravastatin that I can see in chart, and patient doesn't remember taking it.  He was started on low-dose lipitor in 2015 but states it made him weak so he stopped it.   Risk Factors:  AAA, non-obstructive on cath years ago, low HDL, age -  Goals: LDL goal < 70, non-HDL goal < 100 Meds:  none Intolerant:  Lipitor 40m    Labs: 10/2013: TC 188, TG 195 (not fasting), HDL 35, LDL 114, LDL-P 1430, Alk Phos 154, ALT 20, AST 33 (not on therapy)  Late 2014 [facility]  TC 211, TG 164, HDL 38, ~ LDL 140 mg/dL; Alk Phos 199, ALT 63, AST 54, Total Bili 0.86  (not on therapy)  Current Outpatient Prescriptions  Medication Sig Dispense Refill  . furosemide (LASIX) 20 MG tablet Take 1 tablet (20 mg total) by mouth daily. 90 tablet 3  . Lactobacillus (DIGESTIVE HEALTH PROBIOTIC) CAPS Take 1 capsule by mouth daily. "30 million cultures"    . lidocaine-prilocaine (EMLA) cream Apply 1 application topically as needed.      . metoprolol tartrate (LOPRESSOR) 25 MG tablet Take 1 tablet (25 mg total) by mouth 2 (two) times daily. 180  tablet 3  . metroNIDAZOLE (METROGEL) 0.75 % gel Apply 1 application topically 2 (two) times daily.    . Multiple Vitamins-Minerals (MULTIVITAMINS THER. W/MINERALS) TABS Take 1 tablet by mouth daily.      .Marland Kitchennystatin-triamcinolone (MYCOLOG II) cream Apply 1 application topically daily as needed. Rash    . OVER THE COUNTER MEDICATION Inhale 30 mg into the lungs 3 (three) times daily. LUNG SUPPORT    . oxyCODONE-acetaminophen (PERCOCET/ROXICET) 5-325 MG per tablet Take 1-2 tablets by mouth every 4 (four) hours as needed for moderate pain. 50 tablet 0  . pravastatin (PRAVACHOL) 10 MG tablet Take 1 tablet (10 mg total) by mouth daily. 30 tablet 6  . SYNTHROID 175 MCG tablet Take 175 mcg by mouth daily.     .Marland Kitchentiotropium (SPIRIVA) 18 MCG inhalation capsule Place 18 mcg into inhaler and inhale daily.     No current facility-administered medications for this visit.   No Known Allergies Family History  Problem Relation Age of Onset  . Anesthesia problems Neg Hx   . Hypotension Neg Hx   . Malignant hyperthermia Neg Hx   . Pseudochol deficiency Neg Hx   . Cancer Father   . Diabetes Father   . Hyperlipidemia Brother   . AAA (abdominal aortic aneurysm) Brother   . Hyperlipidemia Mother   . Hyperlipidemia Sister

## 2014-11-02 NOTE — Assessment & Plan Note (Signed)
Discussed potential options with patient.  He has tried a couple of statins in the past.  He does not want to try Lipitor since he had some weakness before.  Options include pravastatin, Crestor or Livalo.  Pt would prefer to try pravastatin again first since he does not remember have any issues with it and it is generic.  Will start at pravastatin 10mg - will likely need dose titration but will ensure no change in liver enzymes before increasing dose.  Plan to recheck enzymes in 1 month and lipid panel in 3 months.

## 2014-11-02 NOTE — Patient Instructions (Addendum)
Start pravastatin 10mg  once daily at night.   If you have any problems, please call Gay Filler at [phone].   We will recheck your liver in 1 month (anytime between 7:30am and 4:30pm) and your cholesterol in 3 months when you see Dr. Meda Coffee.

## 2014-11-06 ENCOUNTER — Ambulatory Visit (HOSPITAL_BASED_OUTPATIENT_CLINIC_OR_DEPARTMENT_OTHER): Payer: Medicare Other

## 2014-11-06 VITALS — BP 131/78 | HR 92 | Temp 97.5°F

## 2014-11-06 DIAGNOSIS — Z452 Encounter for adjustment and management of vascular access device: Secondary | ICD-10-CM

## 2014-11-06 DIAGNOSIS — Z95828 Presence of other vascular implants and grafts: Secondary | ICD-10-CM

## 2014-11-06 DIAGNOSIS — Z85118 Personal history of other malignant neoplasm of bronchus and lung: Secondary | ICD-10-CM

## 2014-11-06 MED ORDER — SODIUM CHLORIDE 0.9 % IJ SOLN
10.0000 mL | INTRAMUSCULAR | Status: DC | PRN
  Administered 2014-11-06: 10 mL via INTRAVENOUS
  Filled 2014-11-06: qty 10

## 2014-11-06 MED ORDER — HEPARIN SOD (PORK) LOCK FLUSH 100 UNIT/ML IV SOLN
500.0000 [IU] | Freq: Once | INTRAVENOUS | Status: AC
  Administered 2014-11-06: 500 [IU] via INTRAVENOUS
  Filled 2014-11-06: qty 5

## 2014-11-06 NOTE — Patient Instructions (Signed)

## 2014-11-08 ENCOUNTER — Ambulatory Visit
Admission: RE | Admit: 2014-11-08 | Discharge: 2014-11-08 | Disposition: A | Discharge status: Home / Self Care | Payer: Medicare Other | Source: Ambulatory Visit | Attending: Vascular Surgery | Admitting: Vascular Surgery

## 2014-11-08 ENCOUNTER — Ambulatory Visit: Payer: Medicare Other | Admitting: Vascular Surgery

## 2014-11-08 DIAGNOSIS — Z48812 Encounter for surgical aftercare following surgery on the circulatory system: Secondary | ICD-10-CM

## 2014-11-08 DIAGNOSIS — I714 Abdominal aortic aneurysm, without rupture, unspecified: Secondary | ICD-10-CM

## 2014-11-08 MED ORDER — IOPAMIDOL (ISOVUE-370) INJECTION 76%
75.0000 mL | Freq: Once | INTRAVENOUS | Status: AC | PRN
  Administered 2014-11-08: 75 mL via INTRAVENOUS

## 2014-11-12 ENCOUNTER — Telehealth: Payer: Self-pay | Admitting: Internal Medicine

## 2014-11-12 NOTE — Telephone Encounter (Signed)
s.w. pt and advised on 5.10 appt moved to 5.11 due to MD on Pal..Marland KitchenMarland KitchenMarland Kitchenpt ok [place]

## 2014-11-13 ENCOUNTER — Encounter: Payer: Self-pay | Admitting: Vascular Surgery

## 2014-11-14 ENCOUNTER — Encounter: Payer: Self-pay | Admitting: Vascular Surgery

## 2014-11-14 ENCOUNTER — Ambulatory Visit (INDEPENDENT_AMBULATORY_CARE_PROVIDER_SITE_OTHER): Payer: Medicare Other | Admitting: Vascular Surgery

## 2014-11-14 VITALS — BP 106/79 | HR 82 | Resp 18 | Ht 71.5 in | Wt 209.3 lb

## 2014-11-14 DIAGNOSIS — I714 Abdominal aortic aneurysm, without rupture, unspecified: Secondary | ICD-10-CM

## 2014-11-14 NOTE — Progress Notes (Signed)
    History of Present Illness  The patient is a 74 y.o.  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 He currently denies any significant abdominal pain. He subsequently underwent ventral hernia repair by Dr. Redmond Pulling recently.He states he has had some recurrence of his hernia. He is being followed by Dr. Redmond Pulling.   Physical Examination     Filed Vitals:   11/14/14 1156  BP: 106/79  Pulse: 82  Resp: 18  Height: 5' 11.5" (1.816 m)  Weight: 209 lb 4.8 oz (94.938 kg)   General: A&O x 3, WD overweight male in NAD  Gastrointestinal: soft, non-tender, no distension, obese, midline laparotomy scar, epigastric fascial defect no obvious incarceration  CT scan abdomen and pelvis was reviewed today is images so no proximal or distal pseudoaneurysm and no proximal or distal aneurysm formation. There was evidence of recurrence of the hernia with some transverse colon within the hernia sac.  Assessment/Plan  The patient is a 74 y.o. male who is s/p open AAA repair on 10/20/13.  Overall stable from this. He will follow-up with me in 1 year. We'll consider repeating his CT scan of abdomen and pelvis to reassess his aneurysm graft in 10 years  Ruta Hinds, MD [facility]: [phone] Pager: [phone]

## 2014-11-30 ENCOUNTER — Encounter: Payer: Self-pay | Admitting: Cardiology

## 2014-12-03 ENCOUNTER — Telehealth: Payer: Self-pay | Admitting: Cardiology

## 2014-12-03 NOTE — Telephone Encounter (Signed)
I would start him on Livalo 2 mg po daily. We can give him some samples from the clinic to see if he tolerates it first.

## 2014-12-03 NOTE — Telephone Encounter (Signed)
PT CALLING TO LET us KNOW HE WENT OFF THE STATIN 2 DAYS AGO DUE TO MUSCLE WEAKNESS AND FATIGUE PLS ADVISE [phone]

## 2014-12-03 NOTE — Telephone Encounter (Signed)
Pt calling in stating he stopped taking his pravastatin due to muscle aches and feeling very fatigued.  Pt states he is taking it in the evenings.  Pt states since stopping it, he feels much better.  Pt would like for Dr Meda Coffee to review and advise on a different regimen if possible.  Informed the pt that I will route this message to her for further review and recommendation and follow-up thereafter. Pt verbalized understanding and agrees with this plan.

## 2014-12-04 MED ORDER — PITAVASTATIN CALCIUM 2 MG PO TABS: 2.0000 mg | ORAL_TABLET | Freq: Every day | ORAL | Status: DC

## 2014-12-04 NOTE — Telephone Encounter (Signed)
Informed the pt that per Dr Meda Coffee she recommends the pt discontinue the pravastatin and start taking Livalo 2 mg po daily.  Informed the pt that I can give him some samples of this medication to pick-up and see if he tolerates this.  Per the pt, he would like a 30 day supply of this med called into his pharmacy of choice and provide him with samples to pick up.  Pt states he is coming in for labs tomorrow and will pick the samples up then.  Pt verbalized understanding and agrees with this plan.

## 2014-12-05 ENCOUNTER — Other Ambulatory Visit (INDEPENDENT_AMBULATORY_CARE_PROVIDER_SITE_OTHER): Payer: Medicare Other | Admitting: *Deleted

## 2014-12-05 DIAGNOSIS — E785 Hyperlipidemia, unspecified: Secondary | ICD-10-CM | POA: Diagnosis not present

## 2014-12-05 LAB — HEPATIC FUNCTION PANEL
ALT: 19 U/L (ref 0–53)
AST: 26 U/L (ref 0–37)
Albumin: 3.6 g/dL (ref 3.5–5.2)
Alkaline Phosphatase: 150 U/L — ABNORMAL HIGH (ref 39–117)
Bilirubin, Direct: 0.2 mg/dL (ref 0.0–0.3)
Total Bilirubin: 1.2 mg/dL (ref 0.2–1.2)
Total Protein: 7.1 g/dL (ref 6.0–8.3)

## 2014-12-11 ENCOUNTER — Telehealth: Payer: Self-pay | Admitting: *Deleted

## 2014-12-11 NOTE — Telephone Encounter (Signed)
Contacted the 3rd party pharmacy for prior auth of Livalo 2 mg po daily.  Per pts 3rd party, the pt is approved for Livalo 2 mg po daily from now until the end of the calendar year at 08-31-15.

## 2015-01-01 ENCOUNTER — Other Ambulatory Visit (HOSPITAL_BASED_OUTPATIENT_CLINIC_OR_DEPARTMENT_OTHER): Payer: Medicare Other

## 2015-01-01 ENCOUNTER — Ambulatory Visit (HOSPITAL_COMMUNITY)
Admission: RE | Admit: 2015-01-01 | Discharge: 2015-01-01 | Disposition: A | Discharge status: Home / Self Care | Payer: Medicare Other | Source: Ambulatory Visit | Attending: Internal Medicine | Admitting: Internal Medicine

## 2015-01-01 ENCOUNTER — Encounter (HOSPITAL_COMMUNITY): Payer: Self-pay

## 2015-01-01 DIAGNOSIS — R5383 Other fatigue: Secondary | ICD-10-CM | POA: Insufficient documentation

## 2015-01-01 DIAGNOSIS — Z85118 Personal history of other malignant neoplasm of bronchus and lung: Secondary | ICD-10-CM | POA: Insufficient documentation

## 2015-01-01 DIAGNOSIS — Z923 Personal history of irradiation: Secondary | ICD-10-CM | POA: Insufficient documentation

## 2015-01-01 DIAGNOSIS — C342 Malignant neoplasm of middle lobe, bronchus or lung: Secondary | ICD-10-CM

## 2015-01-01 DIAGNOSIS — Z9221 Personal history of antineoplastic chemotherapy: Secondary | ICD-10-CM | POA: Insufficient documentation

## 2015-01-01 LAB — CBC WITH DIFFERENTIAL/PLATELET
BASO%: 0.6 % (ref 0.0–2.0)
Basophils Absolute: 0 10*3/uL (ref 0.0–0.1)
EOS%: 2.7 % (ref 0.0–7.0)
Eosinophils Absolute: 0.2 10*3/uL (ref 0.0–0.5)
HCT: 42.6 % (ref 38.4–49.9)
HGB: 14.1 g/dL (ref 13.0–17.1)
LYMPH#: 1.8 10*3/uL (ref 0.9–3.3)
LYMPH%: 32.2 % (ref 14.0–49.0)
MCH: 32.3 pg (ref 27.2–33.4)
MCHC: 33 g/dL (ref 32.0–36.0)
MCV: 97.9 fL (ref 79.3–98.0)
MONO#: 0.6 10*3/uL (ref 0.1–0.9)
MONO%: 10.4 % (ref 0.0–14.0)
NEUT%: 54.1 % (ref 39.0–75.0)
NEUTROS ABS: 3 10*3/uL (ref 1.5–6.5)
Platelets: 131 10*3/uL — ABNORMAL LOW (ref 140–400)
RBC: 4.35 10*6/uL (ref 4.20–5.82)
RDW: 14.4 % (ref 11.0–14.6)
WBC: 5.6 10*3/uL (ref 4.0–10.3)

## 2015-01-01 LAB — COMPREHENSIVE METABOLIC PANEL (CC13)
ALT: 20 U/L (ref 0–55)
AST: 27 U/L (ref 5–34)
Albumin: 3.5 g/dL (ref 3.5–5.0)
Alkaline Phosphatase: 137 U/L (ref 40–150)
Anion Gap: 8 mEq/L (ref 3–11)
BILIRUBIN TOTAL: 1.61 mg/dL — AB (ref 0.20–1.20)
BUN: 22.5 mg/dL (ref 7.0–26.0)
CO2: 25 mEq/L (ref 22–29)
CREATININE: 0.9 mg/dL (ref 0.7–1.3)
Calcium: 8.8 mg/dL (ref 8.4–10.4)
Chloride: 107 mEq/L (ref 98–109)
EGFR: 86 mL/min/{1.73_m2} — AB (ref >90)
GLUCOSE: 94 mg/dL (ref 70–140)
Potassium: 4.3 mEq/L (ref 3.5–5.1)
Sodium: 139 mEq/L (ref 136–145)
Total Protein: 6.6 g/dL (ref 6.4–8.3)

## 2015-01-01 MED ORDER — IOHEXOL 300 MG/ML  SOLN
100.0000 mL | Freq: Once | INTRAMUSCULAR | Status: AC | PRN
  Administered 2015-01-01: 80 mL via INTRAVENOUS

## 2015-01-08 ENCOUNTER — Ambulatory Visit: Payer: Medicare Other | Admitting: Internal Medicine

## 2015-01-09 ENCOUNTER — Ambulatory Visit (HOSPITAL_BASED_OUTPATIENT_CLINIC_OR_DEPARTMENT_OTHER): Payer: Medicare Other | Admitting: Internal Medicine

## 2015-01-09 ENCOUNTER — Telehealth: Payer: Self-pay | Admitting: Internal Medicine

## 2015-01-09 ENCOUNTER — Encounter: Payer: Self-pay | Admitting: Internal Medicine

## 2015-01-09 VITALS — BP 142/86 | HR 76 | Temp 97.9°F | Resp 20 | Ht 72.0 in | Wt 216.7 lb

## 2015-01-09 DIAGNOSIS — D696 Thrombocytopenia, unspecified: Secondary | ICD-10-CM | POA: Diagnosis not present

## 2015-01-09 DIAGNOSIS — Z85118 Personal history of other malignant neoplasm of bronchus and lung: Secondary | ICD-10-CM

## 2015-01-09 DIAGNOSIS — C342 Malignant neoplasm of middle lobe, bronchus or lung: Secondary | ICD-10-CM

## 2015-01-09 NOTE — Telephone Encounter (Signed)
Pt confirmed labs/ov per 05/11 POF, gave pt AVS and Calendar.... KJ °

## 2015-01-09 NOTE — Progress Notes (Signed)
[facility] Telephone:([phone]   Fax:([phone]) [phone]  OFFICE PROGRESS NOTE  [name], MD [place] [id number]  DIAGNOSIS: Stage IIIA/IIIB non-small cell lung cancer   PRIOR THERAPY:  1) Concurrent chemoradiation with chemotherapy in the form of weekly carboplatin for an AUC of 2 and paclitaxel at 45 mg per meter squared.  2) Consolidation chemotherapy with carboplatin for AUC of 5 and paclitaxel 175 mg/M2 every 3 weeks with Neulasta support, status post 3 cycles, last cycle was given on 01/05/2012 with partial response.   CURRENT THERAPY: Observation.  INTERVAL HISTORY: Joseph [name] 74 y.o. male returns to the clinic today for followup visit accompanied by his wife. The patient is feeling fine today with no specific complaints. He had few surgeries last year and recovering well. He was started recently by his primary care physician on Pravachol. He denied having any significant chest pain, shortness of breath, cough or hemoptysis. He denied having any significant weight loss or night sweats. The patient had repeat CT scan of the chest performed recently and he is here for evaluation and discussion of his scan results.  MEDICAL HISTORY: Past Medical History  Diagnosis Date  . History of colon polyps 2006,2008  . 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 Hyperlipidemia     takes Pravastatin  . Emphysema   . Enlarged prostate     takes Rapaflo daily  . Arthritis     left knee and back arthrtis  . Hypothyroidism     takes Synthroid daily  . Cataract   . Hx of radiation therapy 09/02/11 to 10/19/11    chest  . Diverticulosis   . Cirrhosis     By radiography, seen on CT  . Rosacea   . CAD (coronary artery disease)   . Shortness of breath   . Heart murmur     teen  . Lung cancer     right upper lobe  . Acute on chronic diastolic CHF (congestive heart failure), NYHA class 3 07/23/2014    ALLERGIES:  has No Known  Allergies.  MEDICATIONS:  Current Outpatient Prescriptions  Medication Sig Dispense Refill  . furosemide (LASIX) 20 MG tablet Take 1 tablet (20 mg total) by mouth daily. 90 tablet 3  . Lactobacillus (DIGESTIVE HEALTH PROBIOTIC) CAPS Take 1 capsule by mouth daily. "30 million cultures"    . lidocaine-prilocaine (EMLA) cream Apply 1 application topically as needed.      . metoprolol tartrate (LOPRESSOR) 25 MG tablet Take 1 tablet (25 mg total) by mouth 2 (two) times daily. 180 tablet 3  . metroNIDAZOLE (METROGEL) 0.75 % gel Apply 1 application topically 2 (two) times daily.    . Multiple Vitamins-Minerals (MULTIVITAMINS THER. W/MINERALS) TABS Take 1 tablet by mouth daily.      Marland Kitchen nystatin-triamcinolone (MYCOLOG II) cream Apply 1 application topically daily as needed. Rash    . OVER THE COUNTER MEDICATION Take 30 mg by mouth 3 (three) times daily. LUNG SUPPORT    . oxyCODONE-acetaminophen (PERCOCET/ROXICET) 5-325 MG per tablet Take 1-2 tablets by mouth every 4 (four) hours as needed for moderate pain. 50 tablet 0  . Pitavastatin Calcium (LIVALO) 2 MG TABS Take 1 tablet (2 mg total) by mouth daily. 30 tablet 1  . pravastatin (PRAVACHOL) 10 MG tablet     . SYNTHROID 175 MCG tablet Take 175 mcg by mouth daily.     . Tiotropium Bromide-Olodaterol 2.5-2.5 MCG/ACT AERS Inhale 2  puffs into the lungs daily.    Marland Kitchen tiotropium (SPIRIVA) 18 MCG inhalation capsule Place 18 mcg into inhaler and inhale daily.     No current facility-administered medications for this visit.    SURGICAL HISTORY:  Past Surgical History  Procedure Laterality Date  . Hernia repair  70's  . Vasectomy  70's  . Finger surgery  2008    left pointer finger  . Knee arthroscopy  2008    left knee  . Tonsillectomy      as a child  . Colonoscopy w/ biopsies      multiple   . Cardiac catheterization  2003  . Esophagogastroduodenoscopy  2006  . Portacath placement  08/10/2011    Procedure: INSERTION PORT-A-CATH;  Surgeon: Pierre Bali, MD;  Location: [place];  Service: Thoracic;  Laterality: Left;  Left Subclavian  . Abdominal aortic aneurysm repair N/A 10/31/2013    Procedure: aorto to left external iliac and right external iliac and left internal iliac, reimplantation of inferior mesenteric artery and ligation of left iliac artery aneursym;  Surgeon: Elam Dutch, MD;  Location: [place];  Service: Vascular;  Laterality: N/A;  . Thrombectomy iliac artery Bilateral 10/31/2013    Procedure: THROMBECTOMY ILIAC ARTERY;  Surgeon: Elam Dutch, MD;  Location: [place];  Service: Vascular;  Laterality: Bilateral;  . Abdominal wound dehiscence N/A 11/10/2013    Procedure: ABDOMINAL WOUND CLOSURE ;  Surgeon: Serafina Mitchell, MD;  Location: [place];  Service: Vascular;  Laterality: N/A;  . Laparotomy N/A 07/10/2014    Procedure: EXPLORATORY LAPAROTOMY;  Surgeon: [name], MD;  Location: [place];  Service: General;  Laterality: N/A;  . Lysis of adhesion N/A 07/10/2014    Procedure: LYSIS OF ADHESION - 90 minutes;  Surgeon: [name], MD;  Location: [place];  Service: General;  Laterality: N/A;  . Bowel resection N/A 07/10/2014    Procedure: SMALL BOWEL RESECTION;  Surgeon: [name], MD;  Location: [place];  Service: General;  Laterality: N/A;  . Incisional hernia repair N/A 07/10/2014    Procedure: HERNIA REPAIR INCISIONAL;  Surgeon: [name], MD;  Location: [place];  Service: General;  Laterality: N/A;  . Abdominal aortagram  10/20/2013    Procedure: ABDOMINAL Maxcine Ham;  Surgeon: Elam Dutch, MD;  Location: [facility] CATH LAB;  Service: Cardiovascular;;  . Lower extremity angiogram Bilateral 10/20/2013    Procedure: LOWER EXTREMITY ANGIOGRAM;  Surgeon: Elam Dutch, MD;  Location: [facility] CATH LAB;  Service: Cardiovascular;  Laterality: Bilateral;    REVIEW OF SYSTEMS:  A comprehensive review of systems was negative except for: Constitutional: positive for fatigue   PHYSICAL EXAMINATION: General appearance: alert,  cooperative and no distress Head: Normocephalic, without obvious abnormality, atraumatic Neck: no adenopathy Lymph nodes: Cervical, supraclavicular, and axillary nodes normal. Resp: clear to auscultation bilaterally Cardio: regular rate and rhythm, S1, S2 normal, no murmur, click, rub or gallop GI: soft, non-tender; bowel sounds normal; no masses,  no organomegaly Extremities: extremities normal, atraumatic, no cyanosis or edema  ECOG PERFORMANCE STATUS: 1 - Symptomatic but completely ambulatory  Blood pressure 142/86, pulse 76, temperature 97.9 F (36.6 C), temperature source Oral, resp. rate 20, height 6' (1.829 m), weight 216 lb 11.2 oz (98.294 kg).  LABORATORY DATA: Lab Results  Component Value Date   WBC 5.6 01/01/2015   HGB 14.1 01/01/2015   HCT 42.6 01/01/2015   MCV 97.9 01/01/2015   PLT 131* 01/01/2015  Chemistry      Component Value Date/Time   NA 139 01/01/2015 1158   NA 137 10/24/2014 1313   K 4.3 01/01/2015 1158   K 4.7 10/24/2014 1313   CL 107 10/24/2014 1313   CL 109* 08/01/2012 1046   CO2 25 01/01/2015 1158   CO2 26 10/24/2014 1313   BUN 22.5 01/01/2015 1158   BUN 14 10/24/2014 1313   CREATININE 0.9 01/01/2015 1158   CREATININE 0.87 10/24/2014 1313   CREATININE 1.00 08/17/2013 1250      Component Value Date/Time   CALCIUM 8.8 01/01/2015 1158   CALCIUM 9.2 10/24/2014 1313   ALKPHOS 137 01/01/2015 1158   ALKPHOS 150* 12/05/2014 0847   AST 27 01/01/2015 1158   AST 26 12/05/2014 0847   ALT 20 01/01/2015 1158   ALT 19 12/05/2014 0847   BILITOT 1.61* 01/01/2015 1158   BILITOT 1.2 12/05/2014 0847       RADIOGRAPHIC STUDIES: Ct Chest W Contrast  01/01/2015   CLINICAL DATA:  Right lung cancer diagnosed and 2012. Status post chemotherapy and radiation therapy. Fatigue.  EXAM: CT CHEST WITH CONTRAST  TECHNIQUE: Multidetector CT imaging of the chest was performed during intravenous contrast administration.  CONTRAST:  79m OMNIPAQUE IOHEXOL 300 MG/ML   SOLN  COMPARISON:  07/03/2014  FINDINGS: Chest wall: Stable left-sided Port-A-Cath. No chest wall mass, supraclavicular or axillary lymphadenopathy. The bony thorax is intact. No destructive bone lesions or spinal canal compromise.  Mediastinum: The heart is normal in size. No pericardial effusion. Stable aortic and branch vessel calcifications including the coronary arteries. No focal aneurysm or dissection. There are stable small scattered mediastinal lymph nodes. The anterior mediastinal node measures 8 mm and the prevascular node measures 7.5 mm. Stable 7 mm short axis subcarinal lymph node. The distal esophagus is grossly normal.  Lungs/ pleura: Stable severe emphysematous changes and pulmonary scarring. Radiation changes involving the right paramediastinal long.  No interval change and 9 mm right perihilar pulmonary nodule on image number 31. No new pulmonary lesions. No acute pulmonary findings.  Upper abdomen:  No significant findings or interval change.  IMPRESSION: 1. Stable severe emphysematous changes and pulmonary scarring. Stable radiation changes involving the right paramediastinal long. 2. Stable small scattered mediastinal lymph nodes. 3. Stable 9 mm right lower lobe pulmonary nodule.  No new lesions.   Electronically Signed   By: PMarijo SanesM.D.   On: 01/01/2015 15:03   ASSESSMENT AND PLAN: This is a very pleasant 74years old white male with history of stage IIIA/IIIB non-small cell lung cancer status post concurrent chemoradiation followed by consolidation chemotherapy and he has been observation since May of 2013 with no evidence for disease progression. The recent CT scan of the chest showed no significant evidence for disease progression. I discussed the scan results with the patient and his wife. I recommended for him to continue on observation with repeat CT scan of the chest in 6 months.  Regarding the increased bilirubin and thrombocytopenia, this could be secondary to recent  treatment with Pravachol. I advise him to check with his primary care physician for close monitoring of her lab results and adjustment of the dose if needed. The patient was advised to call immediately if he has any concerning symptoms in the interval. All questions were answered. The patient knows to call the clinic with any problems, questions or concerns. We can certainly see the patient much sooner if necessary.  Disclaimer: This note was dictated with voice recognition software.  Similar sounding words can inadvertently be transcribed and may not be corrected upon review.

## 2015-01-10 ENCOUNTER — Telehealth: Payer: Self-pay | Admitting: Cardiology

## 2015-01-10 NOTE — Telephone Encounter (Signed)
New message     Pt had labs drawn by Dr Curt Bears.  Please look at them because his platlets and bilirubin was abn and he is on a statin drug.  Please call to discuss stopping statin

## 2015-01-10 NOTE — Telephone Encounter (Signed)
Pt calling to inform Dr Meda Coffee that he went to see his PCP Dr Curt Bears and had labs drawn.  Per Dr Worthy Flank report the pts labs showed that his platelets and bilirubin was abnormal and Dr Julien Nordmann felt this was related to the pt taking statins.  Informed the pt that I spoke with Dr Meda Coffee about this and per Dr Meda Coffee, the pt may come off of his prescribed statin.  Discontinued this in the pts chart.  Pt verbalized understanding and gracious for all the assistance provided.

## 2015-01-17 ENCOUNTER — Other Ambulatory Visit: Payer: Self-pay | Admitting: Cardiology

## 2015-01-22 ENCOUNTER — Ambulatory Visit (INDEPENDENT_AMBULATORY_CARE_PROVIDER_SITE_OTHER): Payer: Medicare Other | Admitting: Cardiology

## 2015-01-22 ENCOUNTER — Encounter: Payer: Self-pay | Admitting: Cardiology

## 2015-01-22 VITALS — BP 124/64 | HR 66 | Ht 72.0 in | Wt 215.0 lb

## 2015-01-22 DIAGNOSIS — E785 Hyperlipidemia, unspecified: Secondary | ICD-10-CM

## 2015-01-22 DIAGNOSIS — I480 Paroxysmal atrial fibrillation: Secondary | ICD-10-CM

## 2015-01-22 DIAGNOSIS — I4891 Unspecified atrial fibrillation: Secondary | ICD-10-CM

## 2015-01-22 DIAGNOSIS — I2510 Atherosclerotic heart disease of native coronary artery without angina pectoris: Secondary | ICD-10-CM | POA: Diagnosis not present

## 2015-01-22 DIAGNOSIS — R0609 Other forms of dyspnea: Secondary | ICD-10-CM

## 2015-01-22 DIAGNOSIS — Z01818 Encounter for other preprocedural examination: Secondary | ICD-10-CM | POA: Insufficient documentation

## 2015-01-22 DIAGNOSIS — I2583 Coronary atherosclerosis due to lipid rich plaque: Secondary | ICD-10-CM

## 2015-01-22 DIAGNOSIS — R0600 Dyspnea, unspecified: Secondary | ICD-10-CM

## 2015-01-22 MED ORDER — RED YEAST RICE 600 MG PO CAPS
600.0000 mg | ORAL_CAPSULE | Freq: Every day | ORAL | Status: DC
Start: 2015-01-22 — End: 2015-12-16

## 2015-01-22 NOTE — Progress Notes (Signed)
Patient ID: Joseph [name], male   DOB: August 13, 1941, 74 y.o.   MRN: [id number]    Patient Name: Joseph [name]  Date of Encounter: 09/29/2013  Primary Care Provider: Janalyn Rouse, MD   Primary Cardiologist: Ena Dawley, H   Problem List   Reason for visit: main complain: afib with RVR, SOB  Past Medical History   Diagnosis  Date   .  History of colon polyps  2006,2008   .  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  Hyperlipidemia      takes Pravastatin   .  Emphysema    .  Lung cancer      right upper lobe   .  Enlarged prostate      takes Rapaflo daily   .  Arthritis      left knee and back arthrtis   .  Hypothyroidism      takes Synthroid daily   .  Cataract    .  Hx of radiation therapy  09/02/11 to 10/19/11     chest   .  Diverticulosis    .  Cirrhosis      By radiography, seen on CT   .  Rosacea     Past Surgical History   Procedure  Laterality  Date   .  Hernia repair   70's   .  Vasectomy   70's   .  Finger surgery   2008     left pointer finger   .  Knee arthroscopy   2008     left knee   .  Tonsillectomy       as a child   .  Colonoscopy w/ biopsies       multiple   .  Cardiac catheterization   2003   .  Esophagogastroduodenoscopy   2006   .  Portacath placement   08/10/2011     Procedure: INSERTION PORT-A-CATH; Surgeon: Pierre Bali, MD; Location: [facility] OR; Service: Thoracic; Laterality: Left; Left Subclavian   Allergies  No Known Allergies    HPI  74 year old male with prior medical history of hyperlipidemia, known nonobstructive coronary artery disease was originally referred to [place] for preop evaluation prior to 4.4 cm AAA repair.  The patient states that approximately 10 years ago he was evaluated for coronary artery disease because of abnormal EKG that was followed by abnormal stress test and a cardiac catheterization that only showed minimal atherosclerotic plaque. The patient also has a history of lung cancer for which she has been  treated with chemotherapy and radiation therapy in 2012 and is currently in remission. The patient quit smoking 2 years ago and is being treated for COPD and emphysema with Spiriva.   On 10/31/2013 the patient underwent Aorto to left and right external iliac bypass, left internal iliac artery bypass, ligation right internal iliac aneurysm, reimplantation of inferior mesenteric artery. Thrombectomy of left and right iliac and femoral arteries. This was complicated with respiratory failure in addition to acute blood loss anemia and the patient received 6 units of FFP and 4 units of PRBCs in addition to 2 units of platelets during his admission. He was intubated and sedated for several days. He is feeling much better today and recovered very well. He denied having any significant chest pain, shortness of breath, cough or hemoptysis. He feels overall very tired and hasn't been back to preop baseline yet. He states that post surgery he retained 20 lbs of fluids  but now is bak to baseline. Has some residual minimal LE edema.   He has known hyperlipidemia however he developed liver enzymes elevation after his chemotherapy and is being followed a GI specialist Dr. Charlott Holler, he has never been started on statins. He denies any alcohol intake.  07/23/2014 The patient was hospitalized with SBO, has had eval for both AAA progression and for an incarcerated incisional ventral hernia, s/p resection, mesh and wound vac placement. He was also diagnosed with new onset a-fib with RVR, started on amiodarone, no anticoagulation because of high risk of bleeding (recent surgery, ongoing incision wound, liver cirrhosis).  He was also started on home O2 therapy, coming for a follow up visit 5 days after discharge.  He feels weak, hypotensive at home, BP in 90', has not been taking lasix because of that. No palpitations. No chest pain, ongoing stable SOB after walking few steps. No PND, minimal LE edema.   08/06/2014 - the  patient is coming for 2 week follow-up for acute on chronic CHF with preserved ejection fraction and chronic atrial fibrillation. The patient states that his lower extremity edema has significantly improved after we started Lasix 20 mg daily at the last visit. Some days he forgets to use it anyway it still improved. His breathing has also slightly improved. He still continues to wear wound VAC that might be taken away today. He continues to take amiodarone.   01/22/2015 - 3 months follow up, the patient saw Dr Hedy Camara at the lipid clinic and was started on Livalo, however developed elevation in Bilirubin so it was discontinued. He feels mild DOE, no orthopnea, PND, chest pain. Currently minimally active. He is scheduled for an abdominal hernia repair.    Home Medications  Prior to Admission medications   Medication  Sig  Start Date  End Date  Taking?  Authorizing Provider   doxycycline (VIBRAMYCIN) 100 MG capsule  Take 100 mg by mouth 2 (two) times daily.    Yes  Historical Provider, MD   FINACEA 15 % cream  Apply 1 application topically as directed.  12/01/12   Yes  Historical Provider, MD   HYDROcodone-acetaminophen (NORCO) 5-325 MG per tablet  Take 1 tablet by mouth every 6 (six) hours as needed for pain.  01/04/12   Yes  Carlton Adam, PA-C   lidocaine-prilocaine (EMLA) cream  Apply 1 application topically as needed.  08/06/11   Yes  Curt Bears, MD   metroNIDAZOLE (METROGEL) 0.75 % gel  Apply 1 application topically 2 (two) times daily.    Yes  Historical Provider, MD   MILK THISTLE PO  Take 1 capsule by mouth daily. Take one 1200 mg capsule daily    Yes  Historical Provider, MD   Multiple Vitamins-Minerals (MULTIVITAMINS THER. W/MINERALS) TABS  Take 1 tablet by mouth daily.    Yes  Historical Provider, MD   nystatin-triamcinolone (MYCOLOG II) cream  Apply 1 application topically daily as needed.  07/10/11   Yes  Historical Provider, MD   OVER THE COUNTER MEDICATION  30 mg 3 (three) times daily.  LUNG SUPPORT    Yes  Historical Provider, MD   OVER THE COUNTER MEDICATION  [place] TAIL MUSHROOM Takes 1 gr daily    Yes  Historical Provider, MD   SYNTHROID 175 MCG tablet  Take 175 mcg by mouth daily.  06/08/11   Yes  Stephens Shire, MD   tamsulosin (FLOMAX) 0.4 MG CAPS  Take 0.4 mg by mouth daily.  Yes  Historical Provider, MD   tiotropium (SPIRIVA) 18 MCG inhalation capsule  Place 18 mcg into inhaler and inhale daily.    Yes  Historical Provider, MD    Family History  Family History   Problem  Relation  Age of Onset   .  Anesthesia problems  Neg Hx    .  Hypotension  Neg Hx    .  Malignant hyperthermia  Neg Hx    .  Pseudochol deficiency  Neg Hx    .  Cancer  Father    .  Diabetes  Father    .  Hyperlipidemia  Brother    .  Aneurysm  Brother      Abdominal aortic aneurysm   .  Hyperlipidemia  Mother    .  Hyperlipidemia  Sister    Social History  History    Social History   .  Marital Status:  Married     Spouse Name:  N/A     Number of Children:  N/A   .  Years of Education:  N/A    Occupational History   .  Not on file.    Social History Main Topics   .  Smoking status:  Former Smoker -- 1.50 packs/day for 40 years     Types:  Cigarettes     Quit date:  07/02/2011   .  Smokeless tobacco:  Former Systems developer   .  Alcohol Use:  1.8 oz/week     3 Cans of beer per week      Comment: social   .  Drug Use:  No   .  Sexual Activity:  Not on file    Other Topics  Concern   .  Not on file    Social History Narrative   .  No narrative on file    Review of Systems, as per HPI, otherwise negative  General: No chills, fever, night sweats or weight changes.  Cardiovascular: No chest pain, dyspnea on exertion, edema, orthopnea, palpitations, paroxysmal nocturnal dyspnea.  Dermatological: No rash, lesions/masses  Respiratory: No cough, dyspnea  Urologic: No hematuria, dysuria  Abdominal: No nausea, vomiting, diarrhea, bright red blood per rectum, melena, or hematemesis    Neurologic: No visual changes, wkns, changes in mental status.  All other systems reviewed and are otherwise negative except as noted above.   Physical Exam  Blood pressure 124/66, pulse 94, height 6' (1.829 m), weight 222 lb (100.699 kg).  General: Pleasant, NAD  Psych: Normal affect.  Neuro: Alert and oriented X 3. Moves all extremities spontaneously.  HEENT: Normal  Neck: Supple without bruits or JVD.  Lungs: Resp regular and unlabored, crackles at both bases  Heart: RRR no s3, s4, or murmurs.  Abdomen: Soft, non-tender, non-distended, BS + x 4.  Extremities: No clubbing, cyanosis, mild B/L pitting edema up to mid calves. DP/PT/Radials 2+ and equal bilaterally.   Labs:  No results found for this basename: CKTOTAL, CKMB, TROPONINI, in the last 72 hours  Lab Results   Component  Value  Date    WBC  5.1  07/17/2013    HGB  14.6  07/17/2013    HCT  43.2  07/17/2013    MCV  99.2*  07/17/2013    PLT  134*  07/17/2013    Accessory Clinical Findings   Echocardiogram - 10/12/2013 - Left ventricle: The cavity size was normal. Systolic function was normal. The estimated ejection fraction was in the range of 55% to  60%. Wall motion was normal; there were no regional wall motion abnormalities. There was an increased relative contribution of atrial contraction to ventricular filling. Doppler parameters are consistent with abnormal left ventricular relaxation (grade 1 diastolic dysfunction). - Aortic valve: Moderate diffuse thickening and calcification, consistent with sclerosis. Trivial regurgitation. - Pulmonary arteries: PA peak pressure: 79m Hg (S). Impressions:  - The right ventricular systolic pressure was increased consistent with mild pulmonary hypertension  ECG - sinus rhythm, normal ECG    Assessment & Plan   This is a 74year old male with a history of known nonobstructive CAD and HLP,  post Aorto to left and right external iliac bypass in 3/15, left internal iliac  artery bypass, ligation right internal iliac aneurysm, reimplantation of inferior mesenteric artery. Thrombectomy of left and right iliac and femoral arteries. S/p SBO -> resection, incarcerated ventral hernia with repair , wound vac in place, new onset a-fib with RVR.  1. DOE, preop evaluation for ventral hernia s/p ex lap- we will schedule a nuclear Lexiscan stress test - the last cath over 12 years ago showed non-obstructive CAD and now has DOE.   2. A-fib with RVR Maintaining NSR. off amiodarone, on metoprolol 25 mg po BID,  Not a good anticoagulation candidate with bleeding risk and h/o cirrhosis  3. 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 He is almost euvolemic with no lower extremity edema on 20 mg of Lasix daily. Crea stable.   4. Non obstructive CAD - asymptomatic, no statins as prior liver enzymes elevation, restart aspirin, consider betablocker at the next visit  5. COPD - on home O2.   6. Hypothyroidism: started on synthroid  7. HLP - fatigue and muscle pain, elevated LFTs at the last visit, no statins, red yeast rice recommended.  Follow up in 6 months.    NDorothy Spark MD, [facility] 09/29/2013, 1:59 PM

## 2015-01-22 NOTE — Patient Instructions (Signed)
Medication Instructions:   STOP LIVALO NOW  START TAKING OTC RED YEAST RICE 600 MG CAPSULE--TAKE ONCE DAILY---YOU CAN PURCHASE THIS OTC AT YOUR LOCAL PHARMACY    Testing/Procedures:   Your physician has requested that you have a lexiscan myoview. For further information please visit [email or web address]. Please follow instruction sheet, as given.    Follow-Up:  Your physician wants you to follow-up in: [place] will receive a reminder letter in the mail two months in advance. If you don't receive a letter, please call our office to schedule the follow-up appointment.

## 2015-02-01 ENCOUNTER — Encounter: Payer: Self-pay | Admitting: Cardiology

## 2015-02-04 ENCOUNTER — Other Ambulatory Visit: Payer: Self-pay | Admitting: Cardiology

## 2015-02-05 ENCOUNTER — Telehealth: Payer: Self-pay | Admitting: Cardiology

## 2015-02-05 ENCOUNTER — Telehealth: Payer: Self-pay

## 2015-02-05 ENCOUNTER — Telehealth (HOSPITAL_COMMUNITY): Payer: Self-pay

## 2015-02-05 NOTE — Telephone Encounter (Signed)
Patient aware to hold Metoprolol the day of the stress test per Dr. Meda Coffee.

## 2015-02-05 NOTE — Telephone Encounter (Signed)
Patient recently switched to Livalo '2mg'$ . Had samples and rx sent to [facility]. Approved by [employer] but the states probably only for a month or 2. Will await future requests, if any for prior approval.

## 2015-02-05 NOTE — Telephone Encounter (Signed)
Pt c/o medication issue:  1. Name of Medication: Lasix and Metoprolol   4. What is your medication issue? Pt called states that Dr. Meda Coffee has instructed him not to take lasix or metoprolol. However he has a Myoview scheduled for 06/08, Nuc depart has advised to take the medication(s). Pt request a call back to determine which direction to take Please call back to clarify.

## 2015-02-05 NOTE — Telephone Encounter (Signed)
Hold metoprolol on the day of stress test

## 2015-02-05 NOTE — Telephone Encounter (Signed)
Patient given detailed instructions per Myocardial Perfusion Study Information Sheet for test on 02-07-2015 at 9:00am. Patient verbalized understanding. Oletta Lamas, Fusako Tanabe A

## 2015-02-05 NOTE — Telephone Encounter (Signed)
Will Forward to Dr. Meda Coffee to clarify.

## 2015-02-07 ENCOUNTER — Ambulatory Visit (HOSPITAL_COMMUNITY): Payer: Medicare Other | Attending: Internal Medicine

## 2015-02-07 DIAGNOSIS — I4891 Unspecified atrial fibrillation: Secondary | ICD-10-CM

## 2015-02-07 DIAGNOSIS — I2510 Atherosclerotic heart disease of native coronary artery without angina pectoris: Secondary | ICD-10-CM | POA: Insufficient documentation

## 2015-02-07 DIAGNOSIS — R0609 Other forms of dyspnea: Secondary | ICD-10-CM | POA: Diagnosis not present

## 2015-02-07 DIAGNOSIS — Z01818 Encounter for other preprocedural examination: Secondary | ICD-10-CM | POA: Insufficient documentation

## 2015-02-07 DIAGNOSIS — I480 Paroxysmal atrial fibrillation: Secondary | ICD-10-CM | POA: Insufficient documentation

## 2015-02-07 DIAGNOSIS — I2583 Coronary atherosclerosis due to lipid rich plaque: Secondary | ICD-10-CM

## 2015-02-07 DIAGNOSIS — R9439 Abnormal result of other cardiovascular function study: Secondary | ICD-10-CM | POA: Diagnosis not present

## 2015-02-07 LAB — MYOCARDIAL PERFUSION IMAGING
LV dias vol: 90 mL
LV sys vol: 39 mL
Nuc Stress EF: 56 %
Peak HR: 93 {beats}/min
RATE: 0.28
Rest HR: 81 {beats}/min
SDS: 2
SRS: 5
SSS: 7
TID: 0.87

## 2015-02-07 MED ORDER — TECHNETIUM TC 99M SESTAMIBI GENERIC - CARDIOLITE
10.9000 | Freq: Once | INTRAVENOUS | Status: AC | PRN
  Administered 2015-02-07: 10.9 via INTRAVENOUS

## 2015-02-07 MED ORDER — TECHNETIUM TC 99M SESTAMIBI GENERIC - CARDIOLITE
33.0000 | Freq: Once | INTRAVENOUS | Status: AC | PRN
  Administered 2015-02-07: 33 via INTRAVENOUS

## 2015-02-07 MED ORDER — REGADENOSON 0.4 MG/5ML IV SOLN
0.4000 mg | Freq: Once | INTRAVENOUS | Status: AC
  Administered 2015-02-07: 0.4 mg via INTRAVENOUS

## 2015-02-08 ENCOUNTER — Encounter: Payer: Self-pay | Admitting: General Surgery

## 2015-02-08 ENCOUNTER — Telehealth: Payer: Self-pay

## 2015-02-08 NOTE — Telephone Encounter (Signed)
-----   Message from Dorothy Spark, MD sent at 02/07/2015  4:51 PM EDT ----- The patient's stress test is considered a low risk, he can proceed with his surgery.

## 2015-02-08 NOTE — Telephone Encounter (Signed)
Pt aware of myoview results with verbal understanding. patient's stress test is considered a low risk, he can proceed with his surgery. Message fwd to [place] @ [facility]

## 2015-02-19 ENCOUNTER — Ambulatory Visit (HOSPITAL_BASED_OUTPATIENT_CLINIC_OR_DEPARTMENT_OTHER): Payer: Medicare Other

## 2015-02-19 VITALS — BP 131/76 | HR 80 | Temp 97.9°F | Resp 18

## 2015-02-19 DIAGNOSIS — Z85118 Personal history of other malignant neoplasm of bronchus and lung: Secondary | ICD-10-CM

## 2015-02-19 DIAGNOSIS — Z95828 Presence of other vascular implants and grafts: Secondary | ICD-10-CM

## 2015-02-19 DIAGNOSIS — Z452 Encounter for adjustment and management of vascular access device: Secondary | ICD-10-CM | POA: Diagnosis not present

## 2015-02-19 MED ORDER — HEPARIN SOD (PORK) LOCK FLUSH 100 UNIT/ML IV SOLN
500.0000 [IU] | Freq: Once | INTRAVENOUS | Status: AC
  Administered 2015-02-19: 500 [IU] via INTRAVENOUS
  Filled 2015-02-19: qty 5

## 2015-02-19 MED ORDER — SODIUM CHLORIDE 0.9 % IJ SOLN
10.0000 mL | INTRAMUSCULAR | Status: DC | PRN
  Administered 2015-02-19: 10 mL via INTRAVENOUS
  Filled 2015-02-19: qty 10

## 2015-02-19 NOTE — Patient Instructions (Signed)

## 2015-02-25 ENCOUNTER — Other Ambulatory Visit: Payer: Self-pay

## 2015-02-27 ENCOUNTER — Telehealth: Payer: Self-pay | Admitting: Cardiology

## 2015-02-27 NOTE — Telephone Encounter (Signed)
Pt calling in regards to his insurance company denying coverage of labs ordered by Dr Meda Coffee back in Feb 2016.  Informed the pt that I spoke with our billing dept about this case and as instructed by billing representative, the pt should call our outside source that deals with these issues, claims, and appeals.  Informed the pt the contact number for our outside billing source at [employer].  Pt verbalize understanding and appreciative for all the help provided.

## 2015-02-27 NOTE — Telephone Encounter (Signed)
New problem   Pt need to speak to nurse concerning some labs he had done.

## 2015-03-14 ENCOUNTER — Telehealth: Payer: Self-pay | Admitting: Cardiology

## 2015-03-14 NOTE — Telephone Encounter (Signed)
Patient has called about labs done in February 2016 (quantitation lipoprotein).  His insurance company is requesting a letter of medical necessity service.  He is getting billed for the lab test until they receive this information.

## 2015-03-18 NOTE — Telephone Encounter (Signed)
[place], Please write this:  To Whom It May Concern,  We have ordered a NMR lipid panel for the most accurate lipid evaluation as this is a very complicated patient with known coronary artery disease but also liver cirrhosis. CAD warrants aggressive use of statins, however severe liver impairment is a relative contraindication. By ordering NMR lipid profile we were able to get more accurate evaluation of his cholesterol levels in order to optimize his management.  Please don't hesitate to contact me with any questions.  Warm regards,  Archer Vise, Jamse Belfast, MD, [facility]

## 2015-03-19 ENCOUNTER — Encounter: Payer: Self-pay | Admitting: *Deleted

## 2015-03-19 NOTE — Telephone Encounter (Signed)
Letter written per Dr Meda Coffee and given to Billing to follow-up with the pt and his insurance, in order to get NMR W Lipids lab covered.

## 2015-03-22 NOTE — Progress Notes (Signed)
Please put orders in Epic surgery 04-04-15 pre op 04-02-15 Thanks

## 2015-03-26 ENCOUNTER — Telehealth: Payer: Self-pay | Admitting: Cardiology

## 2015-03-26 NOTE — Telephone Encounter (Signed)
Talked with patient.  He is okay with waiting until Tuesday to talk to [facility].  He is going for surgery on Wednesday but he said it was ok for Karlene Einstein to discuss with his wife who knows what is going on.

## 2015-03-26 NOTE — Telephone Encounter (Signed)
New Message        Pt calling in regards to his claim that he has been working on with [employer]. Pt states that Dr. Meda Coffee wrote a letter on his behalf and he received a letter today stating that the letter she wrote didn't include enough medical information. Pt wants to speak to [facility] in regards to this matter, notified pt that Karlene Einstein isn't here today and that the message would be forwarded to Triage. Please call back and advise.

## 2015-03-28 ENCOUNTER — Ambulatory Visit: Payer: Self-pay | Admitting: General Surgery

## 2015-03-28 ENCOUNTER — Encounter (HOSPITAL_COMMUNITY): Payer: Self-pay

## 2015-04-01 ENCOUNTER — Encounter (HOSPITAL_COMMUNITY): Payer: Self-pay

## 2015-04-01 NOTE — Patient Instructions (Addendum)
YOUR PROCEDURE IS SCHEDULED ON :  04/04/15  REPORT TO [place] MAIN ENTRANCE FOLLOW SIGNS TO EAST ELEVATOR - GO TO 3rd FLOOR CHECK IN AT 3 EAST NURSES STATION (SHORT STAY) AT:  5:30 AM  CALL THIS NUMBER IF YOU HAVE PROBLEMS THE MORNING OF SURGERY [phone]  REMEMBER:ONLY 1 PER PERSON MAY GO TO SHORT STAY WITH YOU TO GET READY THE MORNING OF YOUR SURGERY  DO NOT EAT FOOD OR DRINK LIQUIDS AFTER MIDNIGHT  TAKE THESE MEDICINES THE MORNING OF SURGERY:  SYNTHRIOID / METOPROLOL / USE SPIRIVA INHALER  STOP ASPIRIN / IBUPROFEN / ALEVE / VITAMINS / HERBAL MEDS __5__ DAYS BEFORE SURGERY  YOU MAY NOT HAVE ANY METAL ON YOUR BODY INCLUDING HAIR PINS AND PIERCING'S. DO NOT WEAR JEWELRY, MAKEUP, LOTIONS, POWDERS OR PERFUMES. DO NOT WEAR NAIL POLISH. DO NOT SHAVE 48 HRS PRIOR TO SURGERY. MEN MAY SHAVE FACE AND NECK.  DO NOT [place]. [place] IS NOT RESPONSIBLE FOR VALUABLES.  CONTACTS, DENTURES OR PARTIALS MAY NOT BE WORN TO SURGERY. LEAVE SUITCASE IN CAR. CAN BE BROUGHT TO ROOM AFTER SURGERY.  PATIENTS DISCHARGED THE DAY OF SURGERY WILL NOT BE ALLOWED TO DRIVE HOME.  PLEASE READ OVER THE FOLLOWING INSTRUCTION SHEETS _________________________________________________________________________________                                          [place] - PREPARING FOR SURGERY  Before surgery, you can play an important role.  Because skin is not sterile, your skin needs to be as free of germs as possible.  You can reduce the number of germs on your skin by washing with CHG (chlorahexidine gluconate) soap before surgery.  CHG is an antiseptic cleaner which kills germs and bonds with the skin to continue killing germs even after washing. Please DO NOT use if you have an allergy to CHG or antibacterial soaps.  If your skin becomes reddened/irritated stop using the CHG and inform your nurse when you arrive at Short Stay. Do not shave (including legs and  underarms) for at least 48 hours prior to the first CHG shower.  You may shave your face. Please follow these instructions carefully:   1.  Shower with CHG Soap the night before surgery and the  morning of Surgery.   2.  If you choose to wash your hair, wash your hair first as usual with your  normal  Shampoo.   3.  After you shampoo, rinse your hair and body thoroughly to remove the  shampoo.                                         4.  Use CHG as you would any other liquid soap.  You can apply chg directly  to the skin and wash . Gently wash with scrungie or clean wascloth    5.  Apply the CHG Soap to your body ONLY FROM THE NECK DOWN.   Do not use on open                           Wound or open sores. Avoid contact with eyes, ears mouth and genitals (private parts).  Genitals (private parts) with your normal soap.              6.  Wash thoroughly, paying special attention to the area where your surgery  will be performed.   7.  Thoroughly rinse your body with warm water from the neck down.   8.  DO NOT shower/wash with your normal soap after using and rinsing off  the CHG Soap .                9.  Pat yourself dry with a clean towel.             10.  Wear clean night clothes to bed after shower             11.  Place clean sheets on your bed the night of your first shower and do not  sleep with pets.  Day of Surgery : Do not apply any lotions/deodorants the morning of surgery.  Please wear clean clothes to the hospital/surgery center.  FAILURE TO FOLLOW THESE INSTRUCTIONS MAY RESULT IN THE CANCELLATION OF YOUR SURGERY    PATIENT SIGNATURE_________________________________  ______________________________________________________________________

## 2015-04-02 ENCOUNTER — Encounter (HOSPITAL_COMMUNITY): Payer: Self-pay

## 2015-04-02 ENCOUNTER — Ambulatory Visit (HOSPITAL_BASED_OUTPATIENT_CLINIC_OR_DEPARTMENT_OTHER): Payer: Medicare Other

## 2015-04-02 ENCOUNTER — Encounter (HOSPITAL_COMMUNITY)
Admission: RE | Admit: 2015-04-02 | Discharge: 2015-04-02 | Disposition: A | Discharge status: Home / Self Care | Payer: Medicare Other | Source: Ambulatory Visit | Attending: General Surgery | Admitting: General Surgery

## 2015-04-02 VITALS — BP 109/75 | HR 73 | Temp 98.0°F | Resp 16

## 2015-04-02 DIAGNOSIS — Z452 Encounter for adjustment and management of vascular access device: Secondary | ICD-10-CM

## 2015-04-02 DIAGNOSIS — Z85118 Personal history of other malignant neoplasm of bronchus and lung: Secondary | ICD-10-CM | POA: Diagnosis not present

## 2015-04-02 DIAGNOSIS — Z95828 Presence of other vascular implants and grafts: Secondary | ICD-10-CM

## 2015-04-02 HISTORY — DX: Personal history of other medical treatment: Z92.89

## 2015-04-02 HISTORY — DX: Personal history of antineoplastic chemotherapy: Z92.21

## 2015-04-02 LAB — CBC WITH DIFFERENTIAL/PLATELET
Basophils Absolute: 0 10*3/uL (ref 0.0–0.1)
Basophils Relative: 0 % (ref 0–1)
EOS ABS: 0.1 10*3/uL (ref 0.0–0.7)
Eosinophils Relative: 2 % (ref 0–5)
HEMATOCRIT: 46.4 % (ref 39.0–52.0)
HEMOGLOBIN: 16 g/dL (ref 13.0–17.0)
Lymphocytes Relative: 36 % (ref 12–46)
Lymphs Abs: 2.3 10*3/uL (ref 0.7–4.0)
MCH: 33.3 pg (ref 26.0–34.0)
MCHC: 34.5 g/dL (ref 30.0–36.0)
MCV: 96.7 fL (ref 78.0–100.0)
MONO ABS: 0.6 10*3/uL (ref 0.1–1.0)
Monocytes Relative: 10 % (ref 3–12)
Neutro Abs: 3.3 10*3/uL (ref 1.7–7.7)
Neutrophils Relative %: 52 % (ref 43–77)
PLATELETS: 113 10*3/uL — AB (ref 150–400)
RBC: 4.8 MIL/uL (ref 4.22–5.81)
RDW: 13.8 % (ref 11.5–15.5)
WBC: 6.3 10*3/uL (ref 4.0–10.5)

## 2015-04-02 LAB — COMPREHENSIVE METABOLIC PANEL
ALBUMIN: 4.3 g/dL (ref 3.5–5.0)
ALK PHOS: 138 U/L — AB (ref 38–126)
ALT: 33 U/L (ref 17–63)
ANION GAP: 6 (ref 5–15)
AST: 35 U/L (ref 15–41)
BUN: 26 mg/dL — ABNORMAL HIGH (ref 6–20)
CALCIUM: 9.3 mg/dL (ref 8.9–10.3)
CO2: 27 mmol/L (ref 22–32)
Chloride: 107 mmol/L (ref 101–111)
Creatinine, Ser: 1 mg/dL (ref 0.61–1.24)
GFR calc Af Amer: >60 mL/min (ref >60)
Glucose, Bld: 88 mg/dL (ref 65–99)
Potassium: 4 mmol/L (ref 3.5–5.1)
Sodium: 140 mmol/L (ref 135–145)
Total Bilirubin: 1.9 mg/dL — ABNORMAL HIGH (ref 0.3–1.2)
Total Protein: 7.7 g/dL (ref 6.5–8.1)

## 2015-04-02 MED ORDER — HEPARIN SOD (PORK) LOCK FLUSH 100 UNIT/ML IV SOLN
500.0000 [IU] | Freq: Once | INTRAVENOUS | Status: AC
  Administered 2015-04-02: 500 [IU] via INTRAVENOUS
  Filled 2015-04-02: qty 5

## 2015-04-02 MED ORDER — SODIUM CHLORIDE 0.9 % IJ SOLN
10.0000 mL | INTRAMUSCULAR | Status: DC | PRN
  Administered 2015-04-02: 10 mL via INTRAVENOUS
  Filled 2015-04-02: qty 10

## 2015-04-02 NOTE — Progress Notes (Signed)
CBC / CMET faxed to Dr.Wilson

## 2015-04-02 NOTE — Patient Instructions (Signed)

## 2015-04-02 NOTE — Telephone Encounter (Signed)
Spoke with the pt and wife and both of them have decided that they will pay out of pocket of $79 for the pts recent NMR w lipids ordered by Dr Meda Coffee.  Pt and wife wanted to give thanks to both Dr Meda Coffee and myself for all the assistance provided.  Informed both parties that I will let Dr Meda Coffee know this.

## 2015-04-03 ENCOUNTER — Encounter (HOSPITAL_COMMUNITY): Payer: Self-pay | Admitting: Anesthesiology

## 2015-04-03 NOTE — Anesthesia Preprocedure Evaluation (Addendum)
Anesthesia Evaluation  Patient identified by MRN, date of birth, ID band Patient awake    Reviewed: Allergy & Precautions, NPO status , Patient's Chart, lab work & pertinent test results  Airway Mallampati: II  TM Distance: >3 FB Neck ROM: Full    Dental  (+) Dental Advisory Given,    Pulmonary shortness of breath, COPD oxygen dependent, former smoker,  breath sounds clear to auscultation  Pulmonary exam normal       Cardiovascular + CAD, + Peripheral Vascular Disease and +CHF Normal cardiovascular exam+ Valvular Problems/Murmurs Rhythm:Regular Rate:Normal  Cardiology office visit of 01-22-15 (Dr. Meda Coffee) reviewed.  ECHO 10-12-13 reviewed. EF 55-60%  Myoview 02-07-15: low risk study; Clearance given by Dr. Meda Coffee  Non obstructive CAD, EF normal, mild pulmonary htn, Afib   Neuro/Psych negative neurological ROS  negative psych ROS   GI/Hepatic negative GI ROS, Neg liver ROS,   Endo/Other  Hypothyroidism   Renal/GU negative Renal ROS  negative genitourinary   Musculoskeletal  (+) Arthritis -,   Abdominal   Peds negative pediatric ROS (+)  Hematology negative hematology ROS (+)   Anesthesia Other Findings   Reproductive/Obstetrics negative OB ROS                      Anesthesia Physical Anesthesia Plan  ASA: IV  Anesthesia Plan: General   Post-op Pain Management:    Induction: Intravenous  Airway Management Planned: Oral ETT  Additional Equipment:   Intra-op Plan:   Post-operative Plan: Extubation in OR and Possible Post-op intubation/ventilation  Informed Consent: I have reviewed the patients History and Physical, chart, labs and discussed the procedure including the risks, benefits and alternatives for the proposed anesthesia with the patient or authorized representative who has indicated his/her understanding and acceptance.   Dental advisory given  Plan Discussed with:  CRNA  Anesthesia Plan Comments: (Increased risk for cardiopulmonary symptoms.)      Anesthesia Quick Evaluation

## 2015-04-04 ENCOUNTER — Inpatient Hospital Stay (HOSPITAL_COMMUNITY): Payer: Medicare Other

## 2015-04-04 ENCOUNTER — Inpatient Hospital Stay (HOSPITAL_COMMUNITY): Payer: Medicare Other | Admitting: Anesthesiology

## 2015-04-04 ENCOUNTER — Inpatient Hospital Stay (HOSPITAL_COMMUNITY)
Admission: RE | Admit: 2015-04-04 | Discharge: 2015-04-10 | DRG: 335 | Disposition: A | Discharge status: Home Health | Payer: Medicare Other | Source: Ambulatory Visit | Attending: Surgery | Admitting: Surgery

## 2015-04-04 ENCOUNTER — Encounter (HOSPITAL_COMMUNITY): Payer: Self-pay | Admitting: *Deleted

## 2015-04-04 ENCOUNTER — Encounter (HOSPITAL_COMMUNITY)
Admission: RE | Disposition: A | Discharge status: Home Health | Payer: Self-pay | Source: Ambulatory Visit | Attending: General Surgery

## 2015-04-04 DIAGNOSIS — E785 Hyperlipidemia, unspecified: Secondary | ICD-10-CM | POA: Diagnosis present

## 2015-04-04 DIAGNOSIS — Z8249 Family history of ischemic heart disease and other diseases of the circulatory system: Secondary | ICD-10-CM | POA: Diagnosis not present

## 2015-04-04 DIAGNOSIS — C3491 Malignant neoplasm of unspecified part of right bronchus or lung: Secondary | ICD-10-CM | POA: Diagnosis present

## 2015-04-04 DIAGNOSIS — Z8679 Personal history of other diseases of the circulatory system: Secondary | ICD-10-CM | POA: Diagnosis not present

## 2015-04-04 DIAGNOSIS — J449 Chronic obstructive pulmonary disease, unspecified: Secondary | ICD-10-CM | POA: Diagnosis not present

## 2015-04-04 DIAGNOSIS — Z809 Family history of malignant neoplasm, unspecified: Secondary | ICD-10-CM | POA: Diagnosis not present

## 2015-04-04 DIAGNOSIS — K567 Ileus, unspecified: Secondary | ICD-10-CM | POA: Diagnosis not present

## 2015-04-04 DIAGNOSIS — R0902 Hypoxemia: Secondary | ICD-10-CM | POA: Diagnosis not present

## 2015-04-04 DIAGNOSIS — I4891 Unspecified atrial fibrillation: Secondary | ICD-10-CM | POA: Diagnosis present

## 2015-04-04 DIAGNOSIS — Y92230 Patient room in hospital as the place of occurrence of the external cause: Secondary | ICD-10-CM

## 2015-04-04 DIAGNOSIS — K7460 Unspecified cirrhosis of liver: Secondary | ICD-10-CM | POA: Diagnosis present

## 2015-04-04 DIAGNOSIS — D696 Thrombocytopenia, unspecified: Secondary | ICD-10-CM | POA: Diagnosis present

## 2015-04-04 DIAGNOSIS — Z79891 Long term (current) use of opiate analgesic: Secondary | ICD-10-CM

## 2015-04-04 DIAGNOSIS — I714 Abdominal aortic aneurysm, without rupture, unspecified: Secondary | ICD-10-CM | POA: Diagnosis present

## 2015-04-04 DIAGNOSIS — Z9981 Dependence on supplemental oxygen: Secondary | ICD-10-CM

## 2015-04-04 DIAGNOSIS — Z9049 Acquired absence of other specified parts of digestive tract: Secondary | ICD-10-CM | POA: Diagnosis present

## 2015-04-04 DIAGNOSIS — J432 Centrilobular emphysema: Secondary | ICD-10-CM | POA: Diagnosis not present

## 2015-04-04 DIAGNOSIS — K432 Incisional hernia without obstruction or gangrene: Principal | ICD-10-CM | POA: Diagnosis present

## 2015-04-04 DIAGNOSIS — Z79899 Other long term (current) drug therapy: Secondary | ICD-10-CM | POA: Diagnosis not present

## 2015-04-04 DIAGNOSIS — I2510 Atherosclerotic heart disease of native coronary artery without angina pectoris: Secondary | ICD-10-CM | POA: Diagnosis present

## 2015-04-04 DIAGNOSIS — J9601 Acute respiratory failure with hypoxia: Secondary | ICD-10-CM | POA: Diagnosis not present

## 2015-04-04 DIAGNOSIS — Z833 Family history of diabetes mellitus: Secondary | ICD-10-CM

## 2015-04-04 DIAGNOSIS — J9811 Atelectasis: Secondary | ICD-10-CM | POA: Diagnosis not present

## 2015-04-04 DIAGNOSIS — J95821 Acute postprocedural respiratory failure: Secondary | ICD-10-CM | POA: Diagnosis not present

## 2015-04-04 DIAGNOSIS — N400 Enlarged prostate without lower urinary tract symptoms: Secondary | ICD-10-CM | POA: Diagnosis present

## 2015-04-04 DIAGNOSIS — K660 Peritoneal adhesions (postprocedural) (postinfection): Secondary | ICD-10-CM | POA: Diagnosis present

## 2015-04-04 DIAGNOSIS — Z85118 Personal history of other malignant neoplasm of bronchus and lung: Secondary | ICD-10-CM

## 2015-04-04 DIAGNOSIS — Z9221 Personal history of antineoplastic chemotherapy: Secondary | ICD-10-CM

## 2015-04-04 DIAGNOSIS — M1990 Unspecified osteoarthritis, unspecified site: Secondary | ICD-10-CM | POA: Diagnosis present

## 2015-04-04 DIAGNOSIS — Z01812 Encounter for preprocedural laboratory examination: Secondary | ICD-10-CM | POA: Diagnosis not present

## 2015-04-04 DIAGNOSIS — E039 Hypothyroidism, unspecified: Secondary | ICD-10-CM | POA: Diagnosis present

## 2015-04-04 DIAGNOSIS — Z87891 Personal history of nicotine dependence: Secondary | ICD-10-CM

## 2015-04-04 DIAGNOSIS — Y838 Other surgical procedures as the cause of abnormal reaction of the patient, or of later complication, without mention of misadventure at the time of the procedure: Secondary | ICD-10-CM | POA: Diagnosis present

## 2015-04-04 DIAGNOSIS — I5032 Chronic diastolic (congestive) heart failure: Secondary | ICD-10-CM | POA: Diagnosis present

## 2015-04-04 DIAGNOSIS — Z86010 Personal history of colonic polyps: Secondary | ICD-10-CM

## 2015-04-04 DIAGNOSIS — Z923 Personal history of irradiation: Secondary | ICD-10-CM

## 2015-04-04 HISTORY — DX: Umbilical hernia with obstruction, without gangrene: K42.0

## 2015-04-04 HISTORY — DX: Unspecified intestinal obstruction, unspecified as to partial versus complete obstruction: K56.609

## 2015-04-04 HISTORY — PX: VENTRAL HERNIA REPAIR: SHX424

## 2015-04-04 HISTORY — DX: Unspecified atrial fibrillation: I48.91

## 2015-04-04 HISTORY — DX: Personal history of colonic polyps: Z86.010

## 2015-04-04 LAB — BASIC METABOLIC PANEL
Anion gap: 6 (ref 5–15)
BUN: 27 mg/dL — ABNORMAL HIGH (ref 6–20)
CALCIUM: 8.2 mg/dL — AB (ref 8.9–10.3)
CHLORIDE: 109 mmol/L (ref 101–111)
CO2: 24 mmol/L (ref 22–32)
Creatinine, Ser: 1.16 mg/dL (ref 0.61–1.24)
GFR calc non Af Amer: >60 mL/min (ref >60)
GLUCOSE: 179 mg/dL — AB (ref 65–99)
Potassium: 4.6 mmol/L (ref 3.5–5.1)
Sodium: 139 mmol/L (ref 135–145)

## 2015-04-04 LAB — BLOOD GAS, ARTERIAL
Acid-base deficit: 3.4 mmol/L — ABNORMAL HIGH (ref 0.0–2.0)
BICARBONATE: 21 meq/L (ref 20.0–24.0)
Drawn by: 257701
O2 Content: 4 L/min
O2 Saturation: 88.1 %
PCO2 ART: 37.6 mmHg (ref 35.0–45.0)
Patient temperature: 98.6
TCO2: 18.3 mmol/L (ref 0–100)
pH, Arterial: 7.366 (ref 7.350–7.450)
pO2, Arterial: 57.8 mmHg — ABNORMAL LOW (ref 80.0–100.0)

## 2015-04-04 LAB — CBC WITH DIFFERENTIAL/PLATELET
BASOS PCT: 0 % (ref 0–1)
Basophils Absolute: 0 10*3/uL (ref 0.0–0.1)
EOS ABS: 0 10*3/uL (ref 0.0–0.7)
Eosinophils Relative: 0 % (ref 0–5)
HCT: 44.6 % (ref 39.0–52.0)
HEMOGLOBIN: 15.4 g/dL (ref 13.0–17.0)
LYMPHS PCT: 11 % — AB (ref 12–46)
Lymphs Abs: 1.4 10*3/uL (ref 0.7–4.0)
MCH: 33.6 pg (ref 26.0–34.0)
MCHC: 34.5 g/dL (ref 30.0–36.0)
MCV: 97.4 fL (ref 78.0–100.0)
MONO ABS: 0.7 10*3/uL (ref 0.1–1.0)
Monocytes Relative: 6 % (ref 3–12)
Neutro Abs: 10.3 10*3/uL — ABNORMAL HIGH (ref 1.7–7.7)
Neutrophils Relative %: 83 % — ABNORMAL HIGH (ref 43–77)
Platelets: 141 10*3/uL — ABNORMAL LOW (ref 150–400)
RBC: 4.58 MIL/uL (ref 4.22–5.81)
RDW: 14 % (ref 11.5–15.5)
WBC: 12.4 10*3/uL — ABNORMAL HIGH (ref 4.0–10.5)

## 2015-04-04 LAB — MRSA PCR SCREENING: MRSA by PCR: NEGATIVE

## 2015-04-04 LAB — MAGNESIUM: MAGNESIUM: 1.7 mg/dL (ref 1.7–2.4)

## 2015-04-04 SURGERY — REPAIR, HERNIA, VENTRAL
Anesthesia: General

## 2015-04-04 MED ORDER — SODIUM CHLORIDE 0.9 % IJ SOLN
INTRAMUSCULAR | Status: AC
  Filled 2015-04-04: qty 10

## 2015-04-04 MED ORDER — METHOCARBAMOL 1000 MG/10ML IJ SOLN
500.0000 mg | Freq: Three times a day (TID) | INTRAVENOUS | Status: AC
  Administered 2015-04-04 – 2015-04-07 (×9): 500 mg via INTRAVENOUS
  Filled 2015-04-04 (×10): qty 5

## 2015-04-04 MED ORDER — DEXAMETHASONE SODIUM PHOSPHATE 10 MG/ML IJ SOLN
INTRAMUSCULAR | Status: DC | PRN
  Administered 2015-04-04: 10 mg via INTRAVENOUS

## 2015-04-04 MED ORDER — ONDANSETRON HCL 4 MG/2ML IJ SOLN
INTRAMUSCULAR | Status: DC | PRN
  Administered 2015-04-04: 4 mg via INTRAVENOUS

## 2015-04-04 MED ORDER — ETOMIDATE 2 MG/ML IV SOLN
INTRAVENOUS | Status: DC | PRN
  Administered 2015-04-04: 16 mg via INTRAVENOUS
  Administered 2015-04-04: 4 mg via INTRAVENOUS

## 2015-04-04 MED ORDER — BUPIVACAINE LIPOSOME 1.3 % IJ SUSP
20.0000 mL | Freq: Once | INTRAMUSCULAR | Status: DC
  Filled 2015-04-04: qty 20

## 2015-04-04 MED ORDER — ESMOLOL HCL 10 MG/ML IV SOLN
INTRAVENOUS | Status: AC
  Filled 2015-04-04: qty 10

## 2015-04-04 MED ORDER — MORPHINE SULFATE 1 MG/ML IV SOLN
INTRAVENOUS | Status: DC
Start: 2015-04-04 — End: 2015-04-08
  Administered 2015-04-04: 10.7 mg via INTRAVENOUS
  Administered 2015-04-04: 18:00:00 via INTRAVENOUS
  Administered 2015-04-04: 24.46 mg via INTRAVENOUS
  Administered 2015-04-04 (×2): via INTRAVENOUS
  Administered 2015-04-05: 6 mg via INTRAVENOUS
  Administered 2015-04-05: 11:00:00 via INTRAVENOUS
  Administered 2015-04-05: 1.5 mg via INTRAVENOUS
  Administered 2015-04-05: 7.5 mg via INTRAVENOUS
  Administered 2015-04-05: 23.89 mg via INTRAVENOUS
  Administered 2015-04-06: 3 mg via INTRAVENOUS
  Administered 2015-04-06: 1.5 mg via INTRAVENOUS
  Administered 2015-04-06: 4.5 mg via INTRAVENOUS
  Administered 2015-04-06: 25 mg via INTRAVENOUS
  Administered 2015-04-06: 3 mg via INTRAVENOUS
  Administered 2015-04-06: 3 mL via INTRAVENOUS
  Administered 2015-04-07: 0 mL via INTRAVENOUS
  Administered 2015-04-07: 3 mL via INTRAVENOUS
  Administered 2015-04-07 (×2): 1.5 mg via INTRAVENOUS
  Administered 2015-04-07: 3 mg via INTRAVENOUS
  Filled 2015-04-04 (×4): qty 25

## 2015-04-04 MED ORDER — EPHEDRINE SULFATE 50 MG/ML IJ SOLN
INTRAMUSCULAR | Status: DC | PRN
  Administered 2015-04-04 (×2): 10 mg via INTRAVENOUS

## 2015-04-04 MED ORDER — CHLORHEXIDINE GLUCONATE 4 % EX LIQD: 1.0000 "application " | Freq: Once | CUTANEOUS | Status: DC

## 2015-04-04 MED ORDER — SODIUM CHLORIDE 0.9 % IJ SOLN: 9.0000 mL | INTRAMUSCULAR | Status: DC | PRN

## 2015-04-04 MED ORDER — TIOTROPIUM BROMIDE MONOHYDRATE 18 MCG IN CAPS
18.0000 ug | ORAL_CAPSULE | Freq: Every morning | RESPIRATORY_TRACT | Status: DC
  Administered 2015-04-05 – 2015-04-10 (×6): 18 ug via RESPIRATORY_TRACT
  Filled 2015-04-04 (×3): qty 5

## 2015-04-04 MED ORDER — NEOSTIGMINE METHYLSULFATE 10 MG/10ML IV SOLN
INTRAVENOUS | Status: DC | PRN
  Administered 2015-04-04: 5 mg via INTRAVENOUS

## 2015-04-04 MED ORDER — LABETALOL HCL 5 MG/ML IV SOLN
INTRAVENOUS | Status: AC
  Filled 2015-04-04: qty 4

## 2015-04-04 MED ORDER — SODIUM CHLORIDE 0.9 % IR SOLN
Status: DC | PRN
  Administered 2015-04-04 (×2): 500 mL

## 2015-04-04 MED ORDER — HYDROMORPHONE HCL 2 MG/ML IJ SOLN
INTRAMUSCULAR | Status: AC
  Filled 2015-04-04: qty 1

## 2015-04-04 MED ORDER — DIPHENHYDRAMINE HCL 50 MG/ML IJ SOLN: 12.5000 mg | Freq: Four times a day (QID) | INTRAMUSCULAR | Status: DC | PRN

## 2015-04-04 MED ORDER — ZOLPIDEM TARTRATE 5 MG PO TABS: 5.0000 mg | ORAL_TABLET | Freq: Every evening | ORAL | Status: DC | PRN

## 2015-04-04 MED ORDER — SUCCINYLCHOLINE CHLORIDE 20 MG/ML IJ SOLN
INTRAMUSCULAR | Status: DC | PRN
  Administered 2015-04-04: 100 mg via INTRAVENOUS

## 2015-04-04 MED ORDER — CEFAZOLIN SODIUM-DEXTROSE 2-3 GM-% IV SOLR
2.0000 g | Freq: Three times a day (TID) | INTRAVENOUS | Status: AC
  Administered 2015-04-04: 2 g via INTRAVENOUS
  Filled 2015-04-04: qty 50

## 2015-04-04 MED ORDER — KCL IN DEXTROSE-NACL 20-5-0.45 MEQ/L-%-% IV SOLN
INTRAVENOUS | Status: AC
  Filled 2015-04-04: qty 1000

## 2015-04-04 MED ORDER — ALBUTEROL SULFATE HFA 108 (90 BASE) MCG/ACT IN AERS
INHALATION_SPRAY | RESPIRATORY_TRACT | Status: DC | PRN
  Administered 2015-04-04: 5 via RESPIRATORY_TRACT

## 2015-04-04 MED ORDER — ROCURONIUM BROMIDE 100 MG/10ML IV SOLN
INTRAVENOUS | Status: DC | PRN
  Administered 2015-04-04: 10 mg via INTRAVENOUS
  Administered 2015-04-04: 20 mg via INTRAVENOUS
  Administered 2015-04-04: 10 mg via INTRAVENOUS
  Administered 2015-04-04: 50 mg via INTRAVENOUS
  Administered 2015-04-04: 20 mg via INTRAVENOUS
  Administered 2015-04-04: 10 mg via INTRAVENOUS

## 2015-04-04 MED ORDER — ONDANSETRON HCL 4 MG/2ML IJ SOLN
4.0000 mg | Freq: Four times a day (QID) | INTRAMUSCULAR | Status: DC | PRN
  Administered 2015-04-07: 4 mg via INTRAVENOUS
  Filled 2015-04-04: qty 2

## 2015-04-04 MED ORDER — METOPROLOL TARTRATE 25 MG PO TABS
25.0000 mg | ORAL_TABLET | Freq: Two times a day (BID) | ORAL | Status: DC
  Administered 2015-04-04 – 2015-04-10 (×12): 25 mg via ORAL
  Filled 2015-04-04 (×15): qty 1

## 2015-04-04 MED ORDER — PHENYLEPHRINE HCL 10 MG/ML IJ SOLN
INTRAMUSCULAR | Status: AC
  Filled 2015-04-04: qty 1

## 2015-04-04 MED ORDER — EPHEDRINE SULFATE 50 MG/ML IJ SOLN
INTRAMUSCULAR | Status: AC
  Filled 2015-04-04: qty 1

## 2015-04-04 MED ORDER — NEOSTIGMINE METHYLSULFATE 10 MG/10ML IV SOLN
INTRAVENOUS | Status: AC
  Filled 2015-04-04: qty 1

## 2015-04-04 MED ORDER — CEFAZOLIN SODIUM-DEXTROSE 2-3 GM-% IV SOLR
INTRAVENOUS | Status: AC
  Filled 2015-04-04: qty 50

## 2015-04-04 MED ORDER — LEVALBUTEROL HCL 1.25 MG/0.5ML IN NEBU
INHALATION_SOLUTION | RESPIRATORY_TRACT | Status: AC
  Administered 2015-04-04: 14:00:00
  Filled 2015-04-04: qty 0.5

## 2015-04-04 MED ORDER — SODIUM CHLORIDE 0.9 % IV SOLN
10.0000 mg | INTRAVENOUS | Status: DC | PRN
  Administered 2015-04-04: 50 ug/min via INTRAVENOUS

## 2015-04-04 MED ORDER — HYDROMORPHONE HCL 1 MG/ML IJ SOLN: 0.2500 mg | INTRAMUSCULAR | Status: DC | PRN

## 2015-04-04 MED ORDER — CETYLPYRIDINIUM CHLORIDE 0.05 % MT LIQD
7.0000 mL | Freq: Two times a day (BID) | OROMUCOSAL | Status: DC
  Administered 2015-04-04 – 2015-04-10 (×11): 7 mL via OROMUCOSAL

## 2015-04-04 MED ORDER — SODIUM CHLORIDE 0.9 % IR SOLN
Status: AC
  Filled 2015-04-04: qty 1

## 2015-04-04 MED ORDER — GLYCOPYRROLATE 0.2 MG/ML IJ SOLN
INTRAMUSCULAR | Status: DC | PRN
  Administered 2015-04-04: 0.6 mg via INTRAVENOUS

## 2015-04-04 MED ORDER — BUPIVACAINE-EPINEPHRINE 0.5% -1:200000 IJ SOLN
INTRAMUSCULAR | Status: AC
  Filled 2015-04-04: qty 1

## 2015-04-04 MED ORDER — PROPOFOL 10 MG/ML IV BOLUS
INTRAVENOUS | Status: AC
  Filled 2015-04-04: qty 20

## 2015-04-04 MED ORDER — ARTIFICIAL TEARS OP OINT
TOPICAL_OINTMENT | OPHTHALMIC | Status: AC
  Filled 2015-04-04: qty 3.5

## 2015-04-04 MED ORDER — PANTOPRAZOLE SODIUM 40 MG IV SOLR
40.0000 mg | Freq: Every day | INTRAVENOUS | Status: DC
  Administered 2015-04-04 – 2015-04-07 (×4): 40 mg via INTRAVENOUS
  Filled 2015-04-04 (×4): qty 40

## 2015-04-04 MED ORDER — TIOTROPIUM BROMIDE-OLODATEROL 2.5-2.5 MCG/ACT IN AERS: 2.0000 | INHALATION_SPRAY | Freq: Every day | RESPIRATORY_TRACT | Status: DC

## 2015-04-04 MED ORDER — MIDAZOLAM HCL 2 MG/2ML IJ SOLN
INTRAMUSCULAR | Status: AC
  Filled 2015-04-04: qty 4

## 2015-04-04 MED ORDER — LEVALBUTEROL HCL 1.25 MG/0.5ML IN NEBU
1.2500 mg | INHALATION_SOLUTION | Freq: Once | RESPIRATORY_TRACT | Status: AC
  Administered 2015-04-04: 1.25 mg via RESPIRATORY_TRACT

## 2015-04-04 MED ORDER — LIDOCAINE HCL (CARDIAC) 20 MG/ML IV SOLN
INTRAVENOUS | Status: DC | PRN
  Administered 2015-04-04: 100 mg via INTRAVENOUS

## 2015-04-04 MED ORDER — LABETALOL HCL 5 MG/ML IV SOLN
INTRAVENOUS | Status: DC | PRN
  Administered 2015-04-04 (×2): 2.5 mg via INTRAVENOUS

## 2015-04-04 MED ORDER — ONDANSETRON HCL 4 MG/2ML IJ SOLN
INTRAMUSCULAR | Status: AC
  Filled 2015-04-04: qty 2

## 2015-04-04 MED ORDER — ALBUTEROL SULFATE HFA 108 (90 BASE) MCG/ACT IN AERS
INHALATION_SPRAY | RESPIRATORY_TRACT | Status: AC
  Filled 2015-04-04: qty 6.7

## 2015-04-04 MED ORDER — CEFAZOLIN SODIUM-DEXTROSE 2-3 GM-% IV SOLR
2.0000 g | INTRAVENOUS | Status: AC
  Administered 2015-04-04 (×2): 2 g via INTRAVENOUS

## 2015-04-04 MED ORDER — MIDAZOLAM HCL 5 MG/5ML IJ SOLN
INTRAMUSCULAR | Status: DC | PRN
  Administered 2015-04-04: 0.5 mg via INTRAVENOUS

## 2015-04-04 MED ORDER — LACTATED RINGERS IV SOLN
INTRAVENOUS | Status: DC | PRN
  Administered 2015-04-04 (×2): via INTRAVENOUS

## 2015-04-04 MED ORDER — SODIUM CHLORIDE 3 % IN NEBU
INHALATION_SOLUTION | RESPIRATORY_TRACT | Status: AC
  Filled 2015-04-04: qty 15

## 2015-04-04 MED ORDER — LACTATED RINGERS IV SOLN: INTRAVENOUS | Status: DC

## 2015-04-04 MED ORDER — DEXAMETHASONE SODIUM PHOSPHATE 10 MG/ML IJ SOLN
INTRAMUSCULAR | Status: AC
  Filled 2015-04-04: qty 1

## 2015-04-04 MED ORDER — LEVOTHYROXINE SODIUM 175 MCG PO TABS
175.0000 ug | ORAL_TABLET | Freq: Every morning | ORAL | Status: DC
  Administered 2015-04-05 – 2015-04-10 (×6): 175 ug via ORAL
  Filled 2015-04-04 (×8): qty 1

## 2015-04-04 MED ORDER — FENTANYL CITRATE (PF) 250 MCG/5ML IJ SOLN
INTRAMUSCULAR | Status: AC
  Filled 2015-04-04: qty 25

## 2015-04-04 MED ORDER — ESMOLOL HCL 10 MG/ML IV SOLN
INTRAVENOUS | Status: DC | PRN
  Administered 2015-04-04 (×3): 20 mg via INTRAVENOUS
  Administered 2015-04-04: 10 mg via INTRAVENOUS

## 2015-04-04 MED ORDER — KCL IN DEXTROSE-NACL 20-5-0.45 MEQ/L-%-% IV SOLN
INTRAVENOUS | Status: DC
  Administered 2015-04-04 – 2015-04-05 (×2): via INTRAVENOUS
  Filled 2015-04-04: qty 1000

## 2015-04-04 MED ORDER — ROCURONIUM BROMIDE 100 MG/10ML IV SOLN
INTRAVENOUS | Status: AC
  Filled 2015-04-04: qty 1

## 2015-04-04 MED ORDER — NALOXONE HCL 0.4 MG/ML IJ SOLN: 0.4000 mg | INTRAMUSCULAR | Status: DC | PRN

## 2015-04-04 MED ORDER — FENTANYL CITRATE (PF) 100 MCG/2ML IJ SOLN
INTRAMUSCULAR | Status: DC | PRN
  Administered 2015-04-04: 50 ug via INTRAVENOUS
  Administered 2015-04-04: 100 ug via INTRAVENOUS
  Administered 2015-04-04 (×2): 25 ug via INTRAVENOUS
  Administered 2015-04-04: 50 ug via INTRAVENOUS
  Administered 2015-04-04: 25 ug via INTRAVENOUS
  Administered 2015-04-04: 100 ug via INTRAVENOUS
  Administered 2015-04-04: 50 ug via INTRAVENOUS

## 2015-04-04 MED ORDER — SODIUM CHLORIDE 0.9 % IJ SOLN
INTRAMUSCULAR | Status: AC
  Filled 2015-04-04: qty 50

## 2015-04-04 MED ORDER — ETOMIDATE 2 MG/ML IV SOLN
INTRAVENOUS | Status: AC
  Filled 2015-04-04: qty 10

## 2015-04-04 MED ORDER — HYDROMORPHONE HCL 1 MG/ML IJ SOLN
INTRAMUSCULAR | Status: DC | PRN
  Administered 2015-04-04: 1 mg via INTRAVENOUS

## 2015-04-04 MED ORDER — LIDOCAINE HCL (CARDIAC) 20 MG/ML IV SOLN
INTRAVENOUS | Status: AC
  Filled 2015-04-04: qty 5

## 2015-04-04 MED ORDER — MORPHINE SULFATE 1 MG/ML IV SOLN
INTRAVENOUS | Status: AC
  Filled 2015-04-04: qty 25

## 2015-04-04 MED ORDER — DIPHENHYDRAMINE HCL 12.5 MG/5ML PO ELIX: 12.5000 mg | ORAL_SOLUTION | Freq: Four times a day (QID) | ORAL | Status: DC | PRN

## 2015-04-04 MED ORDER — HEPARIN SODIUM (PORCINE) 5000 UNIT/ML IJ SOLN
5000.0000 [IU] | Freq: Three times a day (TID) | INTRAMUSCULAR | Status: DC
  Administered 2015-04-05 – 2015-04-10 (×15): 5000 [IU] via SUBCUTANEOUS
  Filled 2015-04-04 (×18): qty 1

## 2015-04-04 MED ORDER — GLYCOPYRROLATE 0.2 MG/ML IJ SOLN
INTRAMUSCULAR | Status: AC
Start: 2015-04-04 — End: 2015-04-04
  Filled 2015-04-04: qty 3

## 2015-04-04 SURGICAL SUPPLY — 39 items
APL SKNCLS STERI-STRIP NONHPOA (GAUZE/BANDAGES/DRESSINGS)
BENZOIN TINCTURE PRP APPL 2/3 (GAUZE/BANDAGES/DRESSINGS) IMPLANT
BINDER ABDOMINAL 12 ML 46-62 (SOFTGOODS) ×1 IMPLANT
BLADE HEX COATED 2.75 (ELECTRODE) ×2 IMPLANT
COVER SURGICAL LIGHT HANDLE (MISCELLANEOUS) ×2 IMPLANT
DECANTER SPIKE VIAL GLASS SM (MISCELLANEOUS) ×2 IMPLANT
DEVICE TROCAR PUNCTURE CLOSURE (ENDOMECHANICALS) ×1 IMPLANT
DRAPE LAPAROSCOPIC ABDOMINAL (DRAPES) ×2 IMPLANT
DRSG OPSITE POSTOP 4X12 (GAUZE/BANDAGES/DRESSINGS) ×1 IMPLANT
DRSG TEGADERM 4X4.75 (GAUZE/BANDAGES/DRESSINGS) ×2 IMPLANT
ELECT REM PT RETURN 9FT ADLT (ELECTROSURGICAL) ×2
ELECTRODE REM PT RTRN 9FT ADLT (ELECTROSURGICAL) ×1 IMPLANT
GAUZE SPONGE 4X4 12PLY STRL (GAUZE/BANDAGES/DRESSINGS) IMPLANT
GLOVE BIO SURGEON STRL SZ7 (GLOVE) ×2 IMPLANT
GLOVE BIO SURGEON STRL SZ7.5 (GLOVE) ×2 IMPLANT
GLOVE BIOGEL M STRL SZ7.5 (GLOVE) IMPLANT
GLOVE BIOGEL PI IND STRL 7.0 (GLOVE) ×1 IMPLANT
GLOVE BIOGEL PI INDICATOR 7.0 (GLOVE) ×1
GLOVE INDICATOR 8.0 STRL GRN (GLOVE) ×2 IMPLANT
GOWN STRL REUS W/ TWL XL LVL3 (GOWN DISPOSABLE) ×1 IMPLANT
GOWN STRL REUS W/TWL LRG LVL3 (GOWN DISPOSABLE) ×2 IMPLANT
GOWN STRL REUS W/TWL XL LVL3 (GOWN DISPOSABLE) ×4 IMPLANT
KIT BASIN OR (CUSTOM PROCEDURE TRAY) ×2 IMPLANT
MESH ULTRAPRO 12X12 30X30CM (Mesh General) ×1 IMPLANT
NEEDLE HYPO 22GX1.5 SAFETY (NEEDLE) IMPLANT
NS IRRIG 1000ML POUR BTL (IV SOLUTION) ×2 IMPLANT
PACK GENERAL/GYN (CUSTOM PROCEDURE TRAY) ×2 IMPLANT
SPONGE DRAIN TRACH 4X4 STRL 2S (GAUZE/BANDAGES/DRESSINGS) ×3 IMPLANT
SPONGE LAP 18X18 X RAY DECT (DISPOSABLE) ×4 IMPLANT
STAPLER VISISTAT 35W (STAPLE) ×2 IMPLANT
STRIP CLOSURE SKIN 1/2X4 (GAUZE/BANDAGES/DRESSINGS) IMPLANT
SUT MNCRL AB 4-0 PS2 18 (SUTURE) ×1 IMPLANT
SUT NOVA NAB GS-21 1 T12 (SUTURE) ×2 IMPLANT
SUT PDS AB 1 TP1 96 (SUTURE) ×4 IMPLANT
SUT SILK 2 0 SH CR/8 (SUTURE) ×1 IMPLANT
SUT SILK 3 0 SH CR/8 (SUTURE) ×1 IMPLANT
SUT VIC AB 3-0 SH 18 (SUTURE) ×2 IMPLANT
SYR CONTROL 10ML LL (SYRINGE) IMPLANT
TOWEL OR 17X26 10 PK STRL BLUE (TOWEL DISPOSABLE) ×2 IMPLANT

## 2015-04-04 NOTE — Anesthesia Procedure Notes (Signed)
Procedure Name: Intubation Date/Time: 04/04/2015 7:33 AM Performed by: Maxwell Caul Pre-anesthesia Checklist: Patient identified, Emergency Drugs available, Suction available and Patient being monitored Patient Re-evaluated:Patient Re-evaluated prior to inductionOxygen Delivery Method: Circle System Utilized Preoxygenation: Pre-oxygenation with 100% oxygen Intubation Type: IV induction Ventilation: Mask ventilation without difficulty and Oral airway inserted - appropriate to patient size Laryngoscope Size: Mac and 4 Grade View: Grade II Tube type: Oral Tube size: 7.5 (Taper Guard ETT) mm Number of attempts: 1 Airway Equipment and Method: Stylet and Oral airway Placement Confirmation: ETT inserted through vocal cords under direct vision,  positive ETCO2 and breath sounds checked- equal and bilateral Secured at: 21 cm Tube secured with: Tape Dental Injury: Teeth and Oropharynx as per pre-operative assessment

## 2015-04-04 NOTE — Progress Notes (Signed)
Patient is resting comfortably on 5L [place]. Sat 90%. Patient does not require BIPAP Patient in no distress at this time. RT will continue to monitor as needed.

## 2015-04-04 NOTE — Anesthesia Postprocedure Evaluation (Deleted)
  Anesthesia Post-op Note  Patient: Joseph [name]  Procedure(s) Performed: Procedure(s) (LRB): OPEN REPAIR OF RECURRENT ABDOMINAL WALL HERNIA WITH MESH, COMPLEX LYSIS OF ADHESIONS X1 HR TAR COMPONENT SEPARATION (N/A)  Patient Location: PACU  Anesthesia Type: General  Level of Consciousness: awake and alert   Airway and Oxygen Therapy: Patient Spontanous Breathing  Post-op Pain: mild  Post-op Assessment: Post-op Vital signs reviewed, Patient's Cardiovascular Status Stable, Respiratory Function unstable and currently on BiPap; Patent Airway and No signs of Nausea or vomiting  Last Vitals:  Filed Vitals:   04/04/15 1510  BP: 133/88  Pulse: 116  Temp:   Resp: 15  Oxygen saturation 88-89 % on simple face mask. Saturation 97% on 100% BiPap.  Post-op Vital Signs: tachycardic   Complications: Requiring increased ventilatory support. Will be transported to stepdown unit per original plan and Dr. Redmond Pulling will consult CCM. CXR showed bibasilar opacities and chronic scarring and COPD changes.

## 2015-04-04 NOTE — Progress Notes (Signed)
Patient resting comfortably on 4L [place]. BIPAP not needed at this time. RT will continue to monitor.

## 2015-04-04 NOTE — Progress Notes (Signed)
Mr. 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 His intraoperative course was unremarkable. Postoperatively, he was slow to awaken and was taken to the recovery room intubated on T-piece oxygen. Over 30 minutes he aroused and has good tidal volumes , good strength,  And followed commands. Although his oxygen saturations were never great being in the low 90s. Decided to extubate him to encourage pulmonary toilet. Xopenex neb given before extubation although no wheezing had been heard. Oxygen saturations did not improve and were 88-89 % on high flow simple oxygen mask. He is not in distress.  BiPaP ordered, Dr. Redmond Pulling informed. CXR ordered. He will go to stepdown and Dr. Redmond Pulling will consult CCM.

## 2015-04-04 NOTE — Interval H&P Note (Signed)
History and Physical Interval Note:  04/04/2015 7:18 AM  Joseph [name]  has presented today for surgery, with the diagnosis of Ventral Hernia  The various methods of treatment have been discussed with the patient and family. After consideration of risks, benefits and other options for treatment, the patient has consented to  Procedure(s): OPEN REPAIR OF VENTRAL INCISIONAL HERNIA WITH MESH (N/A) HERNIA REPAIR INGUINAL ADULT (N/A) INSERTION OF MESH (N/A) as a surgical intervention .  The patient's history has been reviewed, patient examined, no change in status, stable for surgery.  I have reviewed the patient's chart and labs.  Questions were answered to the patient's satisfaction.    Denies cp/pressure/jaw/armpain/tia/pnd/orthopnea Good appetite, normal bms No dysuria All questions asked and answered  [name]. Redmond Pulling, MD, [place], Bariatric, & Minimally Invasive Surgery [facility], [place]   [facility]

## 2015-04-04 NOTE — H&P (Signed)
Joseph [name] 01/25/2015 11:15 AM Location: [facility] Patient #: [id number] DOB: 08-17-1941 Married / Language: Joseph [name] / Race: White Male  History of Present Illness Joseph Hiss M. Ulrich Soules MD; 02/18/2015 2:56 PM) Patient words: Recheck hernia.  The patient is a 74 year old male who presents with an incisional hernia. I last saw him in April. It has not really been causing any pain or discomfort just very noticeable. He states that it is just more prominent and noticeable. He denies any nausea or vomiting diarrhea or constipation. He believes his hernia is getting larger. He has an upcoming stress test in early June. He states that some recent blood work showed an elevated bilirubin and low platelet count. He has follow-up with his primary care physician scheduled. Other than that he denies any changes. He denies any fevers or chills. He reports a good appetite. He denies any chest pain or chest pressure. He denies any shocks of breath at rest. He does have some chronic dyspnea on exertion. He has a new inhaler. He denies any TIAs or amaurosis fugax. Review of systems?a compress of 12 point review of systems was performed. 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 Aneurysm Repair Colon Removal - Partial Vasectomy Colon Polyp Removal - Colonoscopy Tonsillectomy  Diagnostic Studies History Joseph Curry,  MD; 02/18/2015 3:00 PM) Colonoscopy 1-5 years ago  Allergies Joseph Crate, [name]; 2/42/3536 14:43 AM) No Known Drug Allergies12/10/2013  Medication History Joseph Crate, [name]; 1/54/0086 76:19 AM) Furosemide ('20MG'$  Tablet, Oral) Active. Metoprolol Tartrate ('25MG'$  Tablet, Oral) Active. Synthroid (175MCG Tablet, Oral) Active. Multivitamins (Oral) Active. Lactobacillus (Oral) Active. Metrogel (0.75% Gel, External) Active. Oxycodone-Acetaminophen (5-'325MG'$  Tablet, Oral) Active. Amiodarone HCl ('200MG'$  Tablet, Oral) Active. Spiriva HandiHaler (18MCG Capsule, Inhalation) Active. Mycolog II (100000-0.1UNIT/GM-% Cream, External) Active. EMLA (2.5-2.5% Cream, External) Active. Medications Reconciled  Social History Joseph Curry, MD; 02/18/2015 3:00 PM) Alcohol use Occasional alcohol use. Tobacco use Former smoker. No drug use  Family History Joseph Curry, MD; 02/18/2015 3:00 PM) Migraine Headache Mother. Respiratory Condition Father.  Vitals Joseph [name]; 01/06/3266 12:45 AM) 01/25/2015 11:08 AM Weight: 213.5 lb Height: 72in Body Surface Area: 2.22 m Body Mass Index: 28.96 kg/m Pulse: 69 (Regular)  BP: 122/74 (Sitting, Left Arm, Standard)    Physical Exam Joseph Hiss M. Mykale Gandolfo MD; 02/18/2015 2:53 PM) General Mental Status-Alert. General Appearance-Consistent with stated age. Hydration-Well hydrated. Voice-Normal.  Integumentary Note: intact. no rash. no jaundice   Chest and Lung Exam Chest and lung exam reveals -quiet, even and easy respiratory effort with no use of accessory muscles. Inspection Chest Wall - Normal. Back - normal. Auscultation Breath sounds - Normal and Clear.  Cardiovascular Cardiovascular examination reveals -normal heart sounds, regular rate and rhythm with no murmurs.  Abdomen Inspection  Inspection of the abdomen reveals: Note: visible diastasis on right side of upper midline incision; has palpable fascial  defect to right of spit stitch; defect feels about 2cm. soft, nt; reducible; diastasis involves majority of upper abd from below xiphoid to just above  umbilicus; more pronounced on right side - about 16cm vertical by 14 cm wide; pictures in EPIC; also has fascial defect starting about 4cm below xiphoid, defect about 7cm across; 18 cm wide x 24 cm vertical total dimesion. Skin - Scar - Note: Incision: clean, dry, and intact; healed by 2nd intention; 1 spit stitch in midportin of midline incision. Palpation/Percussion Palpation and Percussion of the abdomen reveal - Soft, Non Tender, No Rebound tenderness, No Rigidity (guarding) and No hepatosplenomegaly. Auscultation Auscultation of the abdomen reveals - Bowel sounds normal.  Male Genitourinary Note: both testicles down; old groin incisions b/l; no evidence of LIH; with valsalva, i can feel impulse in Rt groin. +enlarged deep ring on Rt   Neurologic Neurologic evaluation reveals -alert and oriented x 3 with no impairment of recent or remote memory, normal attention span and ability to concentrate, able to name objects and repeat phrases. Appropriate fund of knowledge , normal sensation and normal coordination.  Musculoskeletal Normal Exam - Bilateral-Upper Extremity Strength Normal and Lower Extremity Strength Normal.    Assessment & Plan Joseph Hiss M. Lashae Wollenberg MD; 02/18/2015 3:00 PM) POSTOPERATIVE INCISIONAL HERNIA (553.21  K43.2) Impression: Today he is accompanied by his wife. We had a very prolonged conversation today regarding incisional hernia repair. He has an obvious fascial defect as well as a large loss of domain. The only way to really repair this is with an open approach. This is her second or third conversation about hernia repair. We discussed the risk and benefits of surgery including but not limited to bleeding, infection, injury to surrounding structures, hematoma formation, seroma formation, postoperative ileus, enterotomy, fistula  formation, mesh complications, postoperative blood clot formationm, perioperative cardiac and pulmonary issues, chronic abdominal pain, hernia recurrence. We discussed the typical hospitalization. We discussed the typical postoperative course. We discussed the use of drains. We discussed postoperative pain. Once we have obtained cardiac clearance, our office will contact him to schedule surgery. All of his questions were asked and answered. We also discussed his abnormal blood work. His bilirubin has not been elevated. However he does have some mild thrombocytopenia. It has been as low as this before. However I advised him that if his platelet count continues to decrease it may delay surgery Current Plans  Schedule for Surgery Pt Education - [facility] Free Text Education/Instructions: discussed with patient and provided information. 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 This would be his third recurrence in the right groin. We discussed that it really depends on how well the ventral incisional hernia repair goes on whether or not we proceed with that groin hernia repair at the same day. We discussed when inguinal hernia repair would involve  [name]. Redmond Pulling, MD, FACS General, Bariatric, & Minimally Invasive Surgery [facility], [place]

## 2015-04-04 NOTE — Transfer of Care (Signed)
Immediate Anesthesia Transfer of Care Note  Patient: Joseph [name]  Procedure(s) Performed: Procedure(s): OPEN REPAIR OF RECURRENT ABDOMINAL WALL HERNIA WITH MESH, COMPLEX LYSIS OF ADHESIONS X1 HR TAR COMPONENT SEPARATION (N/A)  Patient Location: PACU  Anesthesia Type:General  Level of Consciousness: sedated  Airway & Oxygen Therapy: Patient connected to T-piece oxygen  Post-op Assessment: Report given to RN and Post -op Vital signs reviewed and stable  Post vital signs: Reviewed and stable  Last Vitals:  Filed Vitals:   04/04/15 0537  BP: 113/77  Pulse: 74  Temp: 36.5 C  Resp: 18    Complications: No apparent anesthesia complications

## 2015-04-04 NOTE — Brief Op Note (Addendum)
04/04/2015  12:11 PM  PATIENT:  Joseph [name]  74 y.o. male  PRE-OPERATIVE DIAGNOSIS:  Recurrent Ventral incisional Hernia  POST-OPERATIVE DIAGNOSIS:  Recurrent ventral incisional hernia, adhesions  PROCEDURE:  Procedure(s): OPEN RETRORECTUS REPAIR OF RECURRENT INCISIONAL VENTRAL HERNIA WITH MESH,  COMPLEX LYSIS OF ADHESIONS X1 HR  BILATERAL POSTERIOR COMPONENT (TAR) SEPARATION (N/A)  SURGEON:  Surgeon(s) and Role:    * Joseph Pickerel, MD - Primary    * Joseph Boston, MD - Assisting  PHYSICIAN ASSISTANT: none  ASSISTANTS: see above   ANESTHESIA:   general  EBL:  Total I/O In: 1000 [I.V.:1000] Out: 175 [Urine:75; Blood:100]  BLOOD ADMINISTERED:none  DRAINS: Urinary Catheter (Foley) and (3) Blake drain(s) in the LUQ drain - subcu space; B/L lower abd wall drains - retrorectus space   LOCAL MEDICATIONS USED:  NONE  SPECIMEN:  Source of Specimen:  abdominal wall skin - discarded  DISPOSITION OF SPECIMEN:  N/A  Type of repair - posterior component separation - TAR  (choices - primary suture, mesh, or component)  Name of mesh - Ultrapro  Size of mesh - Length 12 inches, Width 12 inches  Mesh overlap - >12 cm  Placement of mesh - retrorectus space  (choices - beneath fascia and into peritoneal cavity, beneath fascia but external to peritoneal cavity, between the muscle and fascia, above or external to fascia)   COUNTS:  YES  TOURNIQUET:  * No tourniquets in log *  DICTATION: .Other Dictation: Dictation Number [id number]   PLAN OF CARE: Admit to inpatient   PATIENT DISPOSITION:  PACU - hemodynamically stable.   Delay start of Pharmacological VTE agent (>24hrs) due to surgical blood loss or risk of bleeding: yes  Joseph Ruff. Redmond Pulling, MD, FACS General, Bariatric, & Minimally Invasive Surgery [facility], [place]

## 2015-04-04 NOTE — Progress Notes (Signed)
Pt placed on BIPAP in PACU per MD order.

## 2015-04-04 NOTE — Consult Note (Signed)
Name: Joseph [name] MRN: [id number] DOB: 1940-10-29    ADMISSION DATE:  04/04/2015 CONSULTATION DATE:  8/4  REFERRING MD :  Redmond Pulling   CHIEF COMPLAINT:  Hypoxia   BRIEF PATIENT DESCRIPTION:  This is a 74 year old male w/ sig h/o COPD, AF, RUL IIIA/IIIB non-small cell lung cancer currently on observation since 2013. Admitted by surgical services for elective repair of ventral hernia. This consisted of a long complex procedure w/ complex lysis of adhesions and multiple layers of repair. Post-op course was complicated by hypoxia w/ sats 88% even on simple face-mask. PCCM was asked to see on arrival to the ICU for persistent acute hypoxia.  SIGNIFICANT EVENTS   STUDIES:    HISTORY OF PRESENT ILLNESS:    see above   PAST MEDICAL HISTORY :   has a past medical history of History of colon polyps (1700,1749); Hyperlipidemia; Emphysema; Enlarged prostate; Arthritis; Hypothyroidism; Cataract; radiation therapy (09/02/11 to 10/19/11); Diverticulosis; Cirrhosis; Rosacea; CAD (coronary artery disease); Heart murmur; Acute on chronic diastolic CHF (congestive heart failure), NYHA class 3 (07/23/2014); Lung cancer; Shortness of breath; History of transfusion (2015); and Status post chemotherapy.  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 Prior to Admission medications   Medication Sig Start Date End Date  Taking? Authorizing Provider  furosemide (LASIX) 20 MG tablet TAKE 1 TABLET BY MOUTH ONCE DAILY 01/17/15  Yes Dorothy Spark, MD  Lactobacillus (DIGESTIVE HEALTH PROBIOTIC) CAPS Take 1 capsule by mouth daily. "30 million cultures"   Yes Historical Provider, MD  lidocaine-prilocaine (EMLA) cream Apply 1 application topically as needed.   08/06/11  Yes Curt Bears, MD  Linoleic Acid-Sunflower Oil (CLA PO) Take 2 tablets by mouth 2 (two) times daily.   Yes Historical Provider, MD  Liver Extract (LIVER PO) Take 1 tablet by mouth daily.   Yes Historical Provider, MD  metoprolol tartrate (LOPRESSOR) 25 MG tablet Take 1 tablet (25 mg total) by mouth 2 (two) times daily. 10/24/14  Yes Dorothy Spark, MD  metroNIDAZOLE (METROGEL) 0.75 % gel Apply 1 application topically 2 (two) times daily as needed (flare up.).    Yes Historical Provider, MD  Milk Thistle 200 MG CAPS Take 1 capsule by mouth every morning.   Yes Historical Provider, MD  Multiple Vitamins-Minerals (MULTIVITAMINS THER. W/MINERALS) TABS Take 1 tablet by mouth every morning.    Yes Historical Provider, MD  naproxen sodium (ANAPROX) 220 MG tablet Take 440 mg by mouth 2 (two) times daily as needed (pain.).   Yes Historical Provider, MD  nystatin-triamcinolone (MYCOLOG II) cream Apply 1 application topically daily as needed (rash/flare up.).  07/10/11  Yes Historical Provider, MD  OVER THE COUNTER MEDICATION Take 2 tablets by mouth 2 (two) times daily. LUNG SUPPORT   Yes Historical Provider, MD  oxyCODONE-acetaminophen (PERCOCET/ROXICET) 5-325 MG per tablet Take 1-2 tablets by mouth every 4 (four) hours as needed for moderate pain. 07/19/14  Yes Saverio Danker, PA-C  Red Yeast Rice 600 MG CAPS Take 1 capsule (600 mg total) by mouth daily. 01/22/15  Yes Dorothy Spark, MD  Specialty Vitamins Products (PROSTATE PO) Take 1  tablet by mouth 2 (two) times daily.   Yes Historical Provider, MD  SYNTHROID 175 MCG tablet Take 175 mcg by mouth every  morning.  06/08/11  Yes Stephens Shire, MD  tiotropium (SPIRIVA) 18 MCG inhalation capsule Place 18 mcg into inhaler and inhale every morning.    Yes Historical Provider, MD  Tiotropium Bromide-Olodaterol 2.5-2.5 MCG/ACT AERS Inhale 2 puffs into the lungs daily.    Historical Provider, MD   No Known Allergies  FAMILY HISTORY:  family history includes AAA (abdominal aortic aneurysm) in his brother; Cancer in his father; Diabetes in his father; Hyperlipidemia in his brother, mother, and sister; Hypertension in his brother, mother, and sister. There is no history of Anesthesia problems, Hypotension, Malignant hyperthermia, or Pseudochol deficiency. SOCIAL HISTORY:  reports that he quit smoking about 3 years ago. His smoking use included Cigarettes. He has a 60 pack-year smoking history. He has quit using smokeless tobacco. He reports that he drinks alcohol. He reports that he does not use illicit drugs.  REVIEW OF SYSTEMS:   Constitutional: Negative for fever, chills, weight loss, malaise/fatigue and diaphoresis.  HENT: Negative for hearing loss, ear pain, nosebleeds, congestion, sore throat, neck pain, tinnitus and ear discharge.   Eyes: Negative for blurred vision, double vision, photophobia, pain, discharge and redness.  Respiratory: Negative for cough, hemoptysis, sputum production, shortness of breath, wheezing and stridor.   Cardiovascular: Negative for chest pain, palpitations, orthopnea, claudication, leg swelling and PND.  Gastrointestinal: Negative for heartburn, nausea, vomiting, expected post-op abdominal pain, diarrhea, constipation, blood in stool and melena.  Genitourinary: Negative for dysuria, urgency, frequency, hematuria and flank pain.  Musculoskeletal: Negative for myalgias, back pain, joint pain and falls.  Skin: Negative for itching and rash.  Neurological: Negative for dizziness, tingling, tremors, sensory change, speech change, focal weakness, seizures, loss of  consciousness, weakness and headaches.  Endo/Heme/Allergies: Negative for environmental allergies and polydipsia. Does not bruise/bleed easily.  SUBJECTIVE:  No distress  VITAL SIGNS: Temp:  [97.7 F (36.5 C)-98.6 F (37 C)] 98.6 F (37 C) (08/04 1510) Pulse Rate:  [74-117] 116 (08/04 1510) Resp:  [13-27] 15 (08/04 1510) BP: (113-182)/(77-115) 133/88 mmHg (08/04 1510) SpO2:  [87 %-99 %] 95 % (08/04 1510) FiO2 (%):  [40 %-100 %] 100 % (08/04 1510) Weight:  [97.977 kg (216 lb)-98.3 kg (216 lb 11.4 oz)] 98.3 kg (216 lb 11.4 oz) (08/04 1510)  PHYSICAL EXAMINATION: General:  74 year old male, resting comfortably  Neuro:  Awake, alert no focal def HEENT:  NCAT, no JVd  Cardiovascular:  rrr Lungs:  Clear and w/out wheeze  Abdomen:  Brace intact, dressing CD&I, some drainage from the JP drains (bloody) Musculoskeletal:  Intact  Skin:  Intact    Recent Labs Lab 04/02/15 1420 04/04/15 1235  NA 140 139  K 4.0 4.6  CL 107 109  CO2 27 24  BUN 26* 27*  CREATININE 1.00 1.16  GLUCOSE 88 179*    Recent Labs Lab 04/02/15 1420 04/04/15 1235  HGB 16.0 15.4  HCT 46.4 44.6  WBC 6.3 12.4*  PLT 113* 141*   Portable Chest Xray - Atelectasis  04/04/2015   CLINICAL DATA:  Hypoxemia requiring supplemental oxygen. Postop hernia repair.  EXAM: PORTABLE CHEST - 1 VIEW  COMPARISON:  Chest CT 01/01/2015 and radiographs 07/15/2014  FINDINGS: A left subclavian Port-A-Cath remains in place with tip overlying the mid SVC. Cardiac silhouette is partially obscured but is grossly normal in size. Emphysematous changes are again seen as well  as scarring in the medial right upper lobe. The patient has taken a shallower inspiration than on the prior radiographs, and there is increased, mild opacity in both lung bases. No overt pulmonary edema, pleural effusion, or pneumothorax is identified. There is chronic elevation of the left hemidiaphragm.  IMPRESSION: 1. Shallow inspiration with mild bibasilar opacities,  likely atelectasis. 2. COPD and chronic lung scarring.   Electronically Signed   By: Logan Bores   On: 04/04/2015 14:52    ASSESSMENT / PLAN:  Acute hypoxic respiratory failure  Post op atelectasis w/ decreased abd compliance due to abd surgery  COPD w/out evidence of acute exacerbation.   Suspect that his hypoxia was related to post-op atx and residual anesthesia effect. No evidence of acute exacerbation of COPD. No evidence of edema. Suspect that this is residual atx and anesthesia effect w/ decreased abd compliance from abd surg. Looks better now.  Plan Agree w/ ICU admit Scheduled BDs IS  Pain management Cont CO2 monitoring w/ PCA    S/p ventral hernia repair Plan Diet and wound care per surgery   H/o gd I diastolic HF Plan Cont home rx  Mild thrombocytopenia Plan F/u in am   H/o hypothyroidism Plan Cont synthroid 04/04/2015, 4:06 PM  Erick Colace ACNP-BC [place] Pager # [phone] OR # [phone] if no answer  STAFF NOTE: I, Merrie Roof, MD FACP have personally reviewed patient's available data, including medical history, events of note, physical examination and test results as part of my evaluation. I have discussed with resident/NP and other care providers such as pharmacist, RN and RRT. In addition, I personally evaluated patient and elicited key findings of: post op with resp distress requiring NIMV, has significant COPD, emphysema, and apical predominant changes, mild fibrosis also mid lung zones, all prior CT pcxr reviewed, currently improved off NIMV attempt, no purse lip breathing now, moving air well, change bipap to prn, add prn albuterol, IS important as has chronic left hemi elevation and now with binder, upright position important as able, pcxr i nam for atx , abg now OFF nimv, family updated maintain home copd regimen, he needs an outpt pulmonologist so post DC needs follow up  Lavon Paganini. Titus Mould, MD, [place] Pgr:  [facility] 04/04/2015 4:43 PM

## 2015-04-04 NOTE — Progress Notes (Signed)
Pt transported to ICU on V60. Pt vitals stable pt doing well at this time.

## 2015-04-04 NOTE — Anesthesia Postprocedure Evaluation (Addendum)
  Anesthesia Post-op Note  Patient: Joseph [name]  Procedure(s) Performed: Procedure(s) (LRB): OPEN REPAIR OF RECURRENT ABDOMINAL WALL HERNIA WITH MESH, COMPLEX LYSIS OF ADHESIONS X1 HR TAR COMPONENT SEPARATION (N/A)  Patient Location: PACU  Anesthesia Type: General  Level of Consciousness: awake and alert   Airway and Oxygen Therapy: Patient Spontanous Breathing  Post-op Pain: mild to moderate  Post-op Assessment: Post-op Vital signs reviewed, Patient's Cardiovascular Status Stable, Respiratory Function unstable requiring bipap; Patent Airway and No signs of Nausea or vomiting  Last Vitals:  Filed Vitals:   04/04/15 1510  BP: 133/88  Pulse: 116  Temp: 37 C  Resp: 15    Post-op Vital Signs: stable   Complications: Requiring increased respiratory support. Oxygen saturations 88-89% on simple face mask and 97% on Bipap at 100% FiO2. No significant respiratory distress. Xopenex given before extubation empirically though no wheezing heard. CXR shows probable bibasilar atelectasis and COPD changes and scarring. To stepdown unit per plan and Dr. Redmond Pulling will consult CCM.

## 2015-04-05 ENCOUNTER — Encounter (HOSPITAL_COMMUNITY): Payer: Self-pay | Admitting: General Surgery

## 2015-04-05 DIAGNOSIS — J9601 Acute respiratory failure with hypoxia: Secondary | ICD-10-CM

## 2015-04-05 DIAGNOSIS — J432 Centrilobular emphysema: Secondary | ICD-10-CM

## 2015-04-05 LAB — CBC
HEMATOCRIT: 40.6 % (ref 39.0–52.0)
Hemoglobin: 14.2 g/dL (ref 13.0–17.0)
MCH: 34 pg (ref 26.0–34.0)
MCHC: 35 g/dL (ref 30.0–36.0)
MCV: 97.1 fL (ref 78.0–100.0)
PLATELETS: 121 10*3/uL — AB (ref 150–400)
RBC: 4.18 MIL/uL — AB (ref 4.22–5.81)
RDW: 14.3 % (ref 11.5–15.5)
WBC: 13.3 10*3/uL — ABNORMAL HIGH (ref 4.0–10.5)

## 2015-04-05 LAB — BASIC METABOLIC PANEL
Anion gap: 7 (ref 5–15)
BUN: 32 mg/dL — ABNORMAL HIGH (ref 6–20)
CALCIUM: 8.2 mg/dL — AB (ref 8.9–10.3)
CHLORIDE: 106 mmol/L (ref 101–111)
CO2: 23 mmol/L (ref 22–32)
Creatinine, Ser: 1.22 mg/dL (ref 0.61–1.24)
GFR, EST NON AFRICAN AMERICAN: 57 mL/min — AB (ref >60)
GLUCOSE: 157 mg/dL — AB (ref 65–99)
POTASSIUM: 5.3 mmol/L — AB (ref 3.5–5.1)
SODIUM: 136 mmol/L (ref 135–145)

## 2015-04-05 LAB — GLUCOSE, CAPILLARY: GLUCOSE-CAPILLARY: 129 mg/dL — AB (ref 65–99)

## 2015-04-05 MED ORDER — ACETAMINOPHEN 325 MG PO TABS
650.0000 mg | ORAL_TABLET | ORAL | Status: DC | PRN
  Administered 2015-04-05: 650 mg via ORAL
  Filled 2015-04-05 (×2): qty 2

## 2015-04-05 MED ORDER — OXYCODONE-ACETAMINOPHEN 5-325 MG PO TABS: 1.0000 | ORAL_TABLET | ORAL | Status: DC | PRN

## 2015-04-05 MED ORDER — ALBUTEROL SULFATE (2.5 MG/3ML) 0.083% IN NEBU
2.5000 mg | INHALATION_SOLUTION | RESPIRATORY_TRACT | Status: DC | PRN
  Administered 2015-04-07 – 2015-04-09 (×3): 2.5 mg via RESPIRATORY_TRACT
  Filled 2015-04-05 (×3): qty 3

## 2015-04-05 MED ORDER — DEXTROSE-NACL 5-0.45 % IV SOLN
INTRAVENOUS | Status: DC
  Administered 2015-04-05 (×2): via INTRAVENOUS
  Administered 2015-04-06: 1000 mL via INTRAVENOUS
  Administered 2015-04-07: 02:00:00 via INTRAVENOUS

## 2015-04-05 NOTE — Progress Notes (Signed)
Picked up patient from [place] at 1400 today.

## 2015-04-05 NOTE — Progress Notes (Signed)
Patient is resting comfortably on 5L [place] and O2 SATs are 92%. Pt denies any difficulty breathing. BiPAP is not needed at this time. RT will continue to monitor.

## 2015-04-05 NOTE — Op Note (Signed)
NAMERODD, HEFT NO.:  [id number]  MEDICAL RECORD NO.:  [id number]  LOCATION:  4680                         FACILITY:  [facility]  PHYSICIAN:  [name]. Redmond Pulling, MD, FACSDATE OF BIRTH:  11-09-1940  DATE OF PROCEDURE:  04/04/2015 DATE OF DISCHARGE:                              OPERATIVE REPORT   PREOPERATIVE DIAGNOSIS:  Recurrent ventral incisional hernia.  POSTOPERATIVE DIAGNOSIS:  Recurrent ventral incisional hernia, intraabdominal adhesions.  PROCEDURE: 1. Open retro-rectus repair of recurrent ventral incisional hernia     with mesh. 2. Bilateral posterior component separation-TAR. 3. Lysis of adhesions for 1 hour.  SURGEON:  [name]. Redmond Pulling, MD, FACS  ASSISTANT SURGEONS:  Kairav Boston, MD  ANESTHESIA:  General.  EBL:  100 mL.  DRAINS:  Left upper quadrant Blake drain is in the subcutaneous space, bilateral lower quadrant Blake drains are in the retro-rectus space on top of the mesh.  FINDINGS:  The patient had a recurrent ventral incisional hernia mainly in his upper abdomen from the xiphoid to his umbilicus.  He had 2 well- defined hernia spaces in his upper midline with a component of loss of domain.  After we lysed the adhesions, I was able to perform a posterior component separation on each side and placed a large piece of Ethicon Ultrapro mesh 12 inches x 12 inches.  We then closed the anterior fascia over the mesh.  INDICATIONS FOR PROCEDURE:  The patient is a pleasant 74 year old, Caucasian male, who underwent an urgent laparotomy last November by myself for a small-bowel obstruction.  At that time, he had an incisional hernia from prior abdominal vascular surgery.  His bowel obstruction was not in the hernia per se, but due to intra-abdominal adhesions.  During that surgery, we ended up doing a small bowel Resection.Because there was contamination, I elected to just close the fascia primarily.  Unfortunately, he developed a recurrent  incisional hernia and has requested surgical repair.  We discussed at length the risks and benefits of surgery including, but not limited to, bleeding, infection, injury to surrounding structures, enterotomy, mesh complications, hematoma formation, seroma formation, blood clot formation, postoperative ileus, abdominal wall pain, ileus as well as typical postoperative course.  We had discussed that ideally we would like to place a mesh within the abdominal wall outside of the peritoneal cavity, but depending on the status of the abdominal wall lining that we may have to place it as an intraperitoneal onlay mesh.  DESCRIPTION OF PROCEDURE:  Obtaining informed consent, he was taken to OR #4 at [facility], placed supine on the operating room table.  General endotracheal anesthesia was established.  A Foley catheter was placed.  Sequential compression devices were placed.  His abdomen was prepped and draped in the usual standard surgical fashion with ChloraPrep.  He had an old midline incision that had secondarily healed.  I excised that in elliptical fashion after we had done a surgical time-out.  He had also received IV antibiotics prior to skin incision.  The incision was elliptically excised starting at the xiphoid going down infraumbilically.  I then divided the subcutaneous tissue, and I was able to enter the abdominal cavity.  Initially, I was able to get  easily to the subxiphoid location.  I then started opening up the midline further. As I approached the umbilicus, there was a hernia in the upper midline.  I took down the omental adhesions to the anterior abdominal wall with a combination of electrocautery as well as Metzenbaum scissors.  The right upper quadrant had more omentum stuck to the peritoneum in the right upper quadrant.  We also took down & his falciform ligament.  As we started to approach the umbilicus, there was now a few small bowel loops adhered to the  abdominal wall.  Using Metzenbaum scissors, I was able to free the small bowel loops from the abdominal wall and then at this point, there was no remaining intestines adhered to the abdominal wall.  There were few thin omental adhesions to the lower abdominal wall which were taken down.  All in all, it took about 60 minutes to free all these adhesions.  We had also come through a hernia in the upper midline just in the supraumbilical location as well.  At this point, I began to inspect the status of his rectus sheath on both the left and right sides.  The peritoneum on both sides appeared viable.  I first started working on the right side.  I incised the posterior fascia about 1 cm from the rectus edge and got into the retro- rectus space.  This incision was carried superiorly towards the head, towards the costal margin and then down into the lower abdomen.  I was able to develop this retro-rectus plane.  I took it to the semilunaris line, where I could see what appeared to be some perforating vessels.  At this point, I needed to do with a TAR and therefore achieved enough mobility for closure of the posterior fascia.  I incised the transversalis fascia just medial to the perforating vessels and divided the head of the transversalis muscle and was able to open that up towards the costal margin taking care not to violate the peritoneum this was carried inferiorly as well down to the lower abdomen.  We did not open up the lower midline all the way down to the pubis.  There was no hernia in that location, and the fascia was really strong and intact.  We did extend it about 2 inches below the umbilicus.  At this point, I was able to get into the space and it was a very avascular space.  Now, I was able to mobilize and do the transversalis released very laterally.  As I approached the costal margin on the right side, the posterior fascia was attenuated.  There was a defect made in the  peritoneum, but I was able to reconnect it.  We then switched sides and I started the dissection on the left side, again incising the posterior fascia about 1 cm from the rectus sheath edge.  I then opened up the retro-rectus space and extending it inferiorly as well as superiorly up to the costal margin on the left.  Once I reached the edge of the rectus muscle where there appeared to be a few perforating tiny vessels, I again incised the transversalis muscle and got into the posterior component separation. We carried this up towards the costal margin and down towards the lower part of the abdomen.  I was able to mobilize a lot of posterior sheath this way.  The left side of his abdominal wall, especially the upper abdomen was in much better condition than the right upper quadrant  side. At this point, we irrigated the abdomen with antibiotic irrigation.  I inspected the small bowel to make sure there was no evidence of injury to it.  He was just a little bit oozy from a lot of surfaces that we had mobilized.  At this point, I closed the posterior fascia with two #1 looped PDS sutures.  We had reapproximated the defect in the posterior fascia in the right upper quadrant with several interrupted 3-0 Vicryl sutures taking care not to incorporate any intestine.  One of the defects was a little bit generous and so we plugged that with some of the falciform ligament.  At this point, the posterior fascia was well approximated and closed.  I obtained a Ethicon Ultrapro mesh 30 cm x 30 cm or 12 inches x 12 inches laid it on top of the posterior fascia. There was excellent overlap from the left side of the abdomen all the way to the right side of the abdomen into the axillary line.  There was good overlap superiorly and inferiorly as well.  I then secured the mesh using transfascial sutures.  Using a #1 Novafil suture, I incorporated some of the mesh on the left upper quadrant of the mesh edge and  then, we made a stab incision in the left upper quadrant laterally and then brought the Endoclose and brought out the tails of the Novafil as we would for his typical laparoscopic ventral hernia repair.  There was an additional transfascial suture placed in the left lower quadrant, one at the apex of the mesh in the upper midline position, one in the lower midline position, and I placed 3 transfascial sutures on the right side of the abdomen.  The transfascial sutures were then tied down.  The subcutaneous tissue was released at the transfascial suture sites to prevent puckering.  Two Blake drains were then placed, one on the left side in the left lower quadrant andone on the right side in the right lower quadrant and laid on top of the mesh.  Each drain was secured to the skin with 2-0 nylon.  I then re-irrigated the retro-rectus space with antibiotic irrigation.  We then decided and progressed closing the anterior fascia.  I did mobilize some of the skin along the length of the incision off the anterior fascia for a few cm in each direction.  We then closed the fascia in a running fashion with a #1 looped PDS, one from above and one from below.  The subcutaneous tissue was irrigated with antibiotic irrigation.  I excised some of the redundant skin.  I tacked down the base of the umbilicus to the fascia with 2 interrupted 3-0 Vicryl sutures.  There was a little bit of subcutaneous pocket in the upper midline, so I did bring in another Jeddo drain and brought it out through the left upper quadrant and secured it to the skin with a 2-0 nylon and placed in the upper midline.  We then closed the skin with skin staples.  The 7 transfascial suture sites were then dressed with benzoin and Steri-Strips.  Drain sponges were placed around the drain sites followed by a sterile dressing for the midline incision and an abdominal binder.  All needle, instrument, and sponge counts were correct x2.  There  were no immediate complications.  The patient was taken to the recovery room where attempts were going to be made to extubate the patient in the recovery room.     [name]. Redmond Pulling,  MD, FACS     EMW/MEDQ  D:  04/04/2015  T:  04/04/2015  Job:  [id number]

## 2015-04-05 NOTE — Progress Notes (Signed)
New Orders for discharge were received today at 1730 from Dr. Johney Maine. Pt had abdominal surgery and is still on a PCA  being post op day #1. [facility] was called to clarify. Dr. Donne Hazel responded to page and said to disregard orders. Pt and pt family also asked if they had heard anything about discharge and both stated that Dr. Redmond Pulling told them they would most likely be here throughout the weekend. Will pass on in report to oncoming nurse.

## 2015-04-05 NOTE — Progress Notes (Signed)
1 Day Post-Op  Subjective: Pain controlled. Slept well. On 5L o2. No n/v.  Objective: Vital signs in last 24 hours: Temp:  [97.6 F (36.4 C)-98.8 F (37.1 C)] 98.2 F (36.8 C) (08/05 0800) Pulse Rate:  [2-121] 106 (08/05 0700) Resp:  [13-27] 16 (08/05 0800) BP: (99-182)/(48-115) 125/82 mmHg (08/05 0700) SpO2:  [87 %-99 %] 89 % (08/05 1042) FiO2 (%):  [40 %-100 %] 100 % (08/05 0531) Weight:  [98.3 kg (216 lb 11.4 oz)] 98.3 kg (216 lb 11.4 oz) (08/04 1510) Last BM Date:  (prior to admission )  Intake/Output from previous day: 08/04 0701 - 08/05 0700 In: 3235 [I.V.:3025; IV Piggyback:210] Out: 1185 [Urine:670; Drains:415; Blood:100] Intake/Output this shift:    Alert, nontoxic, sitting in chair cta b/l (just got breathing txt) Reg Soft, abdominal binder; drains more old blood than serous No edema  Lab Results:   Recent Labs  04/04/15 1235 04/05/15 0331  WBC 12.4* 13.3*  HGB 15.4 14.2  HCT 44.6 40.6  PLT 141* 121*   BMET  Recent Labs  04/04/15 1235 04/05/15 0331  NA 139 136  K 4.6 5.3*  CL 109 106  CO2 24 23  GLUCOSE 179* 157*  BUN 27* 32*  CREATININE 1.16 1.22  CALCIUM 8.2* 8.2*   PT/INR No results for input(s): LABPROT, INR in the last 72 hours. ABG  Recent Labs  04/04/15 1630  PHART 7.366  HCO3 21.0    Studies/Results: Portable Chest Xray - Atelectasis  04/04/2015   CLINICAL DATA:  Hypoxemia requiring supplemental oxygen. Postop hernia repair.  EXAM: PORTABLE CHEST - 1 VIEW  COMPARISON:  Chest CT 01/01/2015 and radiographs 07/15/2014  FINDINGS: A left subclavian Port-A-Cath remains in place with tip overlying the mid SVC. Cardiac silhouette is partially obscured but is grossly normal in size. Emphysematous changes are again seen as well as scarring in the medial right upper lobe. The patient has taken a shallower inspiration than on the prior radiographs, and there is increased, mild opacity in both lung bases. No overt pulmonary edema, pleural  effusion, or pneumothorax is identified. There is chronic elevation of the left hemidiaphragm.  IMPRESSION: 1. Shallow inspiration with mild bibasilar opacities, likely atelectasis. 2. COPD and chronic lung scarring.   Electronically Signed   By: Logan Bores   On: 04/04/2015 14:52    Anti-infectives: Anti-infectives    Start     Dose/Rate Route Frequency Ordered Stop   04/04/15 2000  ceFAZolin (ANCEF) IVPB 2 g/50 mL premix     2 g 100 mL/hr over 30 Minutes Intravenous 3 times per day 04/04/15 1514 04/04/15 2139   04/04/15 0951  polymyxin B 500,000 Units, bacitracin 50,000 Units in sodium chloride irrigation 0.9 % 500 mL irrigation  Status:  Discontinued       As needed 04/04/15 0951 04/04/15 1225   04/04/15 0625  ceFAZolin (ANCEF) IVPB 2 g/50 mL premix     2 g 100 mL/hr over 30 Minutes Intravenous On call to O.R. 04/04/15 0625 04/04/15 1145      Assessment/Plan: s/p Procedure(s): OPEN REPAIR OF RECURRENT ABDOMINAL WALL HERNIA WITH MESH, COMPLEX LYSIS OF ADHESIONS X1 HR TAR COMPONENT SEPARATION (N/A) COPD - RA o2 sat was 90%. Cont nebs, pulm toilet, is/flutter, wean o2 as tolerated GI - start clears VTE prophylaxis - drains still bloody but not bright red. i delayed subcu heparin til late today to minimize bleeding. Will start subcu heparin this pm. Follow cbc. If drops significantly, will need to hold  heparin Hyperkalemia - CCM took K out of IVF; repeat BMET in am Renal - good uop; Cr ok. Remove foley in AM  B/l lower abd drains - in retrorectus space LUQ drain in subcu space  Needs to stay in step down today Family updated  [name]. Redmond Pulling, MD, FACS General, Bariatric, & Minimally Invasive Surgery [facility], [place]    LOS: 1 day    [name] 04/05/2015

## 2015-04-05 NOTE — Care Management Note (Signed)
Case Management Note  Patient Details  Name: Joseph [name] MRN: [id number] Date of Birth: 23-Oct-1940  Subjective/Objective:                 Hypoxia post op requiring 100Fi02   Action/Plan:Date:  April 05, 2015 U.R. performed for needs and level of care. Will continue to follow for Case Management needs.  Velva Harman, RN, BSN, [place]   [phone]   Expected Discharge Date:                  Expected Discharge Plan:  Home/Self Care  In-House Referral:  NA  Discharge planning Services  CM Consult  Post Acute Care Choice:  NA Choice offered to:  NA  DME Arranged:    DME Agency:     HH Arranged:    HH Agency:     Status of Service:  In process, will continue to follow  Medicare Important Message Given:    Date Medicare IM Given:    Medicare IM give by:    Date Additional Medicare IM Given:    Additional Medicare Important Message give by:     If discussed at [place] of Stay Meetings, dates discussed:    Additional Comments:  Leeroy Cha, RN 04/05/2015, 9:55 AM

## 2015-04-05 NOTE — Progress Notes (Signed)
Name: Joseph [name] MRN: [id number] DOB: [birth year]    ADMISSION DATE:  04/04/2015 CONSULTATION DATE:  8/4  REFERRING MD :  Redmond Pulling   CHIEF COMPLAINT:  Hypoxia   BRIEF PATIENT DESCRIPTION:  This is a 74 year old male w/ sig h/o COPD, AF, RUL IIIA/IIIB non-small cell lung cancer currently on observation since 2013. Admitted by surgical services for elective repair of ventral hernia. This consisted of a long complex procedure w/ complex lysis of adhesions and multiple layers of repair. Post-op course was complicated by hypoxia w/ sats 88% even on simple face-mask. PCCM was asked to see on arrival to the ICU for persistent acute hypoxia.  SIGNIFICANT EVENTS   STUDIES:    HISTORY OF PRESENT ILLNESS:    see above   SUBJECTIVE:  No distress  C/o abd pain JPs bloody drainage Off bipap overnight  VITAL SIGNS: Temp:  [97.6 F (36.4 C)-98.8 F (37.1 C)] 98.2 F (36.8 C) (08/05 0800) Pulse Rate:  [2-121] 106 (08/05 0700) Resp:  [13-27] 14 (08/05 0700) BP: (99-182)/(48-115) 125/82 mmHg (08/05 0700) SpO2:  [87 %-99 %] 89 % (08/05 0700) FiO2 (%):  [40 %-100 %] 100 % (08/05 0531) Weight:  [216 lb 11.4 oz (98.3 kg)] 216 lb 11.4 oz (98.3 kg) (08/04 1510)  PHYSICAL EXAMINATION: General:  74 year old male, lying supine Neuro:  Awake, alert no focal def HEENT:  NCAT, no JVd  Cardiovascular:  rrr Lungs:  Clear and w/out wheeze  Abdomen:  Brace intact, dressing CD&I, some drainage from the JP drains (bloody) Musculoskeletal:  Intact  Skin:  Intact    Recent Labs Lab 04/02/15 1420 04/04/15 1235 04/05/15 0331  NA 140 139 136  K 4.0 4.6 5.3*  CL 107 109 106  CO2 '27 24 23  '$ BUN 26* 27* 32*  CREATININE 1.00 1.16 1.22  GLUCOSE 88 179* 157*    Recent Labs Lab 04/02/15 1420 04/04/15 1235 04/05/15 0331  HGB 16.0 15.4 14.2  HCT 46.4 44.6 40.6  WBC 6.3 12.4* 13.3*  PLT 113* 141* 121*   Portable Chest Xray - Atelectasis  04/04/2015   CLINICAL DATA:  Hypoxemia requiring  supplemental oxygen. Postop hernia repair.  EXAM: PORTABLE CHEST - 1 VIEW  COMPARISON:  Chest CT 01/01/2015 and radiographs 07/15/2014  FINDINGS: A left subclavian Port-A-Cath remains in place with tip overlying the mid SVC. Cardiac silhouette is partially obscured but is grossly normal in size. Emphysematous changes are again seen as well as scarring in the medial right upper lobe. The patient has taken a shallower inspiration than on the prior radiographs, and there is increased, mild opacity in both lung bases. No overt pulmonary edema, pleural effusion, or pneumothorax is identified. There is chronic elevation of the left hemidiaphragm.  IMPRESSION: 1. Shallow inspiration with mild bibasilar opacities, likely atelectasis. 2. COPD and chronic lung scarring.   Electronically Signed   By: Logan Bores   On: 04/04/2015 14:52    ASSESSMENT / PLAN:  Acute hypoxic respiratory failure  Post op atelectasis w/ decreased abd compliance due to abd surgery  COPD w/out evidence of acute exacerbation.  H/o lung cancer stg 3A s/p chemo -RT, last CT 12/2014 clear  Suspect that his hypoxia was related to post-op atx and residual anesthesia effect. No evidence of acute exacerbation of COPD. No evidence of edema. Suspect that this is residual atx and  decreased abd compliance from abd surg.  Plan Scheduled duonebs - resume spiriva as outpt IS  Pain management  Cont CO2 monitoring w/ PCA    S/p ventral hernia repair Plan Diet and wound care per surgery   H/o gd I diastolic HF Plan Cont home rx  Mild thrombocytopenia Plan F/u in am   H/o hypothyroidism Plan Cont synthroid  Summary - keep SD status , needs outpt pulm FU on dc  ALVA,RAKESH V. MD  04/05/2015, 8:47 AM

## 2015-04-05 NOTE — Discharge Instructions (Signed)
DRAIN CARE:   You have a closed bulb drain to help you heal.  A bulb drain is a small, plastic reservoir which creates a gentle suction. It is used to remove excess fluid from a surgical wound. The color and amount of fluid will vary. Immediately after surgery, the fluid is bright red. It may gradually change to a yellow color. When the amount decreases to about 1 or 2 tablespoons (15 to 30 cc) per 24 hours, your caregiver will usually remove it.  DAILY CARE  Keep the bulb compressed at all times, except while emptying it. The compression creates suction.   Keep sites where the tubes enter the skin dry and covered with a light bandage (dressing).   Tape the tubes to your skin, 1 to 2 inches below the insertion sites, to keep from pulling on your stitches. Tubes are stitched in place and will not slip out.   Pin the bulb to your shirt (not to your pants) with a safety pin.   For the first few days after surgery, there usually is more fluid in the bulb. Empty the bulb whenever it becomes half full because the bulb does not create enough suction if it is too full. Include this amount in your 24 hour totals.   When the amount of drainage decreases, empty the bulb at the same time every day. Write down the amounts and the 24 hour totals. Your caregiver will want to know them. This helps your caregiver know when the tubes can be removed.   (We anticipate removing the drain in 1-3 weeks, depending on when the output is <28m a day for 2+ days)  If there is drainage around the tube sites, change dressings and keep the area dry. If you see a clot in the tube, leave it alone. However, if the tube does not appear to be draining, let your caregiver know.  TO EMPTY THE BULB  Open the stopper to release suction.   Holding the stopper out of the way, pour drainage into the measuring cup that was sent home with you.   Measure and write down the amount. If there are 2 bulbs, note the amount of drainage  from bulb 1 or bulb 2 and keep the totals separate. Your caregiver will want to know which tube is draining more.   Compress the bulb by folding it in half.   Replace the stopper.   Check the tape that holds the tube to your skin, and pin the bulb to your shirt.  SEEK MEDICAL CARE IF:  The drainage develops a bad odor.   You have an oral temperature above 102 F (38.9 C).   The amount of drainage from your wound suddenly increases or decreases.   You accidentally pull out your drain.   You have any other questions or concerns.  MAKE SURE YOU:   Understand these instructions.   Will watch your condition.   Will get help right away if you are not doing well or get worse.     Call our office if you have any questions about your drain. [phone] HERNIA REPAIR: POST OP INSTRUCTIONS  1. DIET: Follow a light bland diet the first 24 hours after arrival home, such as soup, liquids, crackers, etc.  Be sure to include lots of fluids daily.  Avoid fast food or heavy meals as your are more likely to get nauseated.  Eat a low fat the next few days after surgery. 2. Take your usually  prescribed home medications unless otherwise directed. 3. PAIN CONTROL: a. Pain is best controlled by a usual combination of three different methods TOGETHER: i. Ice/Heat ii. Over the counter pain medication iii. Prescription pain medication b. Most patients will experience some swelling and bruising around the hernia(s) such as the bellybutton, groins, or old incisions.  Ice packs or heating pads (30-60 minutes up to 6 times a day) will help. Use ice for the first few days to help decrease swelling and bruising, then switch to heat to help relax tight/sore spots and speed recovery.  Some people prefer to use ice alone, heat alone, alternating between ice & heat.  Experiment to what works for you.  Swelling and bruising can take several weeks to resolve.   c. It is helpful to take an over-the-counter pain  medication regularly for the first few weeks.  Choose one of the following that works best for you: i. Naproxen (Aleve, etc)  Two '220mg'$  tabs twice a day ii. Ibuprofen (Advil, etc) Three '200mg'$  tabs four times a day (every meal & bedtime) iii. Acetaminophen (Tylenol, etc) 325-'650mg'$  four times a day (every meal & bedtime) d. A  prescription for pain medication should be given to you upon discharge.  Take your pain medication as prescribed.  i. If you are having problems/concerns with the prescription medicine (does not control pain, nausea, vomiting, rash, itching, etc), please call us [phone] to see if we need to switch you to a different pain medicine that will work better for you and/or control your side effect better. ii. If you need a refill on your pain medication, please contact your pharmacy.  They will contact our office to request authorization. Prescriptions will not be filled after 5 pm or on week-ends. 4. Avoid getting constipated.  Between the surgery and the pain medications, it is common to experience some constipation.  Increasing fluid intake and taking a fiber supplement (such as Metamucil, Citrucel, FiberCon, MiraLax, etc) 1-2 times a day regularly will usually help prevent this problem from occurring.  A mild laxative (prune juice, Milk of Magnesia, MiraLax, etc) should be taken according to package directions if there are no bowel movements after 48 hours.   5. Wash / shower every day.  You may shower over the dressings as they are waterproof.   6. Remove your waterproof bandages 5 days after surgery.  You may leave the incision open to air.  You may replace a dressing/Band-Aid to cover the incision for comfort if you wish.  Continue to shower over incision(s) after the dressing is off.    7. ACTIVITIES as tolerated:   a. You may resume regular (light) daily activities beginning the next day--such as daily self-care, walking, climbing stairs--gradually increasing activities as  tolerated.  If you can walk 30 minutes without difficulty, it is safe to try more intense activity such as jogging, treadmill, bicycling, low-impact aerobics, swimming, etc. b. Save the most intensive and strenuous activity for last such as sit-ups, heavy lifting, contact sports, etc  Refrain from any heavy lifting or straining until you are off narcotics for pain control.   c. DO NOT PUSH THROUGH PAIN.  Let pain be your guide: If it hurts to do something, don't do it.  Pain is your body warning you to avoid that activity for another week until the pain goes down. d. You may drive when you are no longer taking prescription pain medication, you can comfortably wear a seatbelt, and you can safely maneuver  your car and apply brakes. e. Dennis Bast may have sexual intercourse when it is comfortable.  8. FOLLOW UP in our office a. Please call [facility] at [phone] to set up an appointment to see your surgeon in the office for a follow-up appointment approximately 2-3 weeks after your surgery. b. Make sure that you call for this appointment the day you arrive home to insure a convenient appointment time. 9.  IF YOU HAVE DISABILITY OR FAMILY LEAVE FORMS, BRING THEM TO THE OFFICE FOR PROCESSING.  DO NOT GIVE THEM TO YOUR DOCTOR.  WHEN TO CALL us [phone]: 1. Poor pain control 2. Reactions / problems with new medications (rash/itching, nausea, etc)  3. Fever over 101.5 F (38.5 C) 4. Inability to urinate 5. Nausea and/or vomiting 6. Worsening swelling or bruising 7. Continued bleeding from incision. 8. Increased pain, redness, or drainage from the incision   The clinic staff is available to answer your questions during regular business hours (8:30am-5pm).  Please dont hesitate to call and ask to speak to one of our nurses for clinical concerns.   If you have a medical emergency, go to the nearest emergency room or call 911.  A surgeon from [facility] is always on call at the hospitals in  [facility], [place], [place], [place], [place]  [address] ?  [address] [address], [place], [place]   [address] MAIN: [phone] ? TOLL FREE: [phone] ? FAX: [phone] [email or web address]  Managing Pain  Pain after surgery or related to activity is often due to strain/injury to muscle, tendon, nerves and/or incisions.  This pain is usually short-term and will improve in a few months.   Many people find it helpful to do the following things TOGETHER to help speed the process of healing and to get back to regular activity more quickly:  1. Avoid heavy physical activity at first a. No lifting greater than 20 pounds at first, then increase to lifting as tolerated over the next few weeks b. Do not push through the pain.  Listen to your body and avoid positions and maneuvers than reproduce the pain.  Wait a few days before trying something more intense c. Walking is okay as tolerated, but go slowly and stop when getting sore.  If you can walk 30 minutes without stopping or pain, you can try more intense activity (running, jogging, aerobics, cycling, swimming, treadmill, sex, sports, weightlifting, etc ) d. Remember: If it hurts to do it, then dont do it!  2. Take Anti-inflammatory medication a. Choose ONE of the following over-the-counter medications: i.            Acetaminophen '500mg'$  tabs (Tylenol) 1-2 pills with every meal and just before bedtime (avoid if you have liver problems) ii.            Naproxen '220mg'$  tabs (ex. Aleve) 1-2 pills twice a day (avoid if you have kidney, stomach, IBD, or bleeding problems) iii. Ibuprofen '200mg'$  tabs (ex. Advil, Motrin) 3-4 pills with every meal and just before bedtime (avoid if you have kidney, stomach, IBD, or bleeding problems) b. Take with food/snack around the clock for 1-2 weeks i. This helps the muscle and nerve tissues become less irritable and calm down faster  3. Use a Heating pad or  Ice/Cold Pack a. 4-6 times a day b. May use warm bath/hottub  or showers  4. Try Gentle Massage and/or Stretching  a. at the area of pain many times  a day b. stop if you feel pain - do not overdo it  Try these steps together to help you body heal faster and avoid making things get worse.  Doing just one of these things may not be enough.    If you are not getting better after two weeks or are noticing you are getting worse, contact our office for further advice; we may need to re-evaluate you & see what other things we can do to help.  GETTING TO GOOD BOWEL HEALTH. Irregular bowel habits such as constipation and diarrhea can lead to many problems over time.  Having one soft bowel movement a day is the most important way to prevent further problems.  The anorectal canal is designed to handle stretching and feces to safely manage our ability to get rid of solid waste (feces, poop, stool) out of our body.  BUT, hard constipated stools can act like ripping concrete bricks and diarrhea can be a burning fire to this very sensitive area of our body, causing inflamed hemorrhoids, anal fissures, increasing risk is perirectal abscesses, abdominal pain/bloating, an making irritable bowel worse.      The goal: ONE SOFT BOWEL MOVEMENT A DAY!  To have soft, regular bowel movements:   Drink plenty of fluids, consider 4-6 tall glasses of water a day.    Take plenty of fiber.  Fiber is the undigested part of plant food that passes into the colon, acting s natures broom to encourage bowel motility and movement.  Fiber can absorb and hold large amounts of water. This results in a larger, bulkier stool, which is soft and easier to pass. Work gradually over several weeks up to 6 servings a day of fiber (25g a day even more if needed) in the form of: o Vegetables -- Root (potatoes, carrots, turnips), leafy green (lettuce, salad greens, celery, spinach), or cooked high residue (cabbage, broccoli, etc) o Fruit --  Fresh (unpeeled skin & pulp), Dried (prunes, apricots, cherries, etc ),  or stewed ( applesauce)  o Whole grain breads, pasta, etc (whole wheat)  o Bran cereals   Bulking Agents -- This type of water-retaining fiber generally is easily obtained each day by one of the following:  o Psyllium bran -- The psyllium plant is remarkable because its ground seeds can retain so much water. This product is available as Metamucil, Konsyl, Effersyllium, Per Diem Fiber, or the less expensive generic preparation in drug and health food stores. Although labeled a laxative, it really is not a laxative.  o Methylcellulose -- This is another fiber derived from wood which also retains water. It is available as Citrucel. o Polyethylene Glycol - and artificial fiber commonly called Miralax or Glycolax.  It is helpful for people with gassy or bloated feelings with regular fiber o Flax Seed - a less gassy fiber than psyllium  No reading or other relaxing activity while on the toilet. If bowel movements take longer than 5 minutes, you are too constipated  AVOID CONSTIPATION.  High fiber and water intake usually takes care of this.  Sometimes a laxative is needed to stimulate more frequent bowel movements, but   Laxatives are not a good long-term solution as it can wear the colon out.  They can help jump-start bowels if constipated, but should be relied on constantly without discussing with your doctor o Osmotics (Milk of Magnesia, Fleets phosphosoda, Magnesium citrate, MiraLax, GoLytely) are safer than  o Stimulants (Senokot, Castor Oil, Dulcolax, Ex Lax)    o  Avoid taking laxatives for more than 7 days in a row.   IF SEVERELY CONSTIPATED, try a Bowel Retraining Program: o Do not use laxatives.  o Eat a diet high in roughage, such as bran cereals and leafy vegetables.  o Drink six (6) ounces of prune or apricot juice each morning.  o Eat two (2) large servings of stewed fruit each day.  o Take one (1) heaping  tablespoon of a psyllium-based bulking agent twice a day. Use sugar-free sweetener when possible to avoid excessive calories.  o Eat a normal breakfast.  o Set aside 15 minutes after breakfast to sit on the toilet, but do not strain to have a bowel movement.  o If you do not have a bowel movement by the third day, use an enema and repeat the above steps.   Controlling diarrhea o Switch to liquids and simpler foods for a few days to avoid stressing your intestines further. o Avoid dairy products (especially milk & ice cream) for a short time.  The intestines often can lose the ability to digest lactose when stressed. o Avoid foods that cause gassiness or bloating.  Typical foods include beans and other legumes, cabbage, broccoli, and dairy foods.  Every person has some sensitivity to other foods, so listen to our body and avoid those foods that trigger problems for you. o Adding fiber (Citrucel, Metamucil, psyllium, Miralax) gradually can help thicken stools by absorbing excess fluid and retrain the intestines to act more normally.  Slowly increase the dose over a few weeks.  Too much fiber too soon can backfire and cause cramping & bloating. o Probiotics (such as active yogurt, Align, etc) may help repopulate the intestines and colon with normal bacteria and calm down a sensitive digestive tract.  Most studies show it to be of mild help, though, and such products can be costly. o Medicines: - Bismuth subsalicylate (ex. Kayopectate, Pepto Bismol) every 30 minutes for up to 6 doses can help control diarrhea.  Avoid if pregnant. - Loperamide (Immodium) can slow down diarrhea.  Start with two tablets ('4mg'$  total) first and then try one tablet every 6 hours.  Avoid if you are having fevers or severe pain.  If you are not better or start feeling worse, stop all medicines and call your doctor for advice o Call your doctor if you are getting worse or not better.  Sometimes further testing (cultures, endoscopy,  X-ray studies, bloodwork, etc) may be needed to help diagnose and treat the cause of the diarrhea.  TROUBLESHOOTING IRREGULAR BOWELS 1) Avoid extremes of bowel movements (no bad constipation/diarrhea) 2) Miralax 17gm mixed in 8oz. water or juice-daily. May use BID as needed.  3) Gas-x,Phazyme, etc. as needed for gas & bloating.  4) Soft,bland diet. No spicy,greasy,fried foods.  5) Prilosec over-the-counter as needed  6) May hold gluten/wheat products from diet to see if symptoms improve.  7)  May try probiotics (Align, Activa, etc) to help calm the bowels down 7) If symptoms become worse call back immediately.   Acute Respiratory Failure Respiratory failure is when your lungs are not working well and your breathing (respiratory) system fails. When respiratory failure occurs, it is difficult for your lungs to get enough oxygen, get rid of carbon dioxide, or both. Respiratory failure can be life threatening.  Respiratory failure can be acute or chronic. Acute respiratory failure is sudden, severe, and requires emergency medical treatment. Chronic respiratory failure is less severe, happens over time, and requires ongoing treatment.  WHAT ARE THE CAUSES OF ACUTE RESPIRATORY FAILURE?  Any problem affecting the heart or lungs can cause acute respiratory failure. Some of these causes include the following:  Chronic bronchitis and emphysema (COPD).   Blood clot going to a lung (pulmonary embolism).   Having water in the lungs caused by heart failure, lung injury, or infection (pulmonary edema).   Collapsed lung (pneumothorax).   Pneumonia.   Pulmonary fibrosis.   Obesity.   Asthma.   Heart failure.   Any type of trauma to the chest that can make breathing difficult.   Nerve or muscle diseases making chest movements difficult. WHAT SYMPTOMS SHOULD YOU WATCH FOR?  If you have any of these signs or symptoms, you should seek immediate medical care:   You have shortness of  breath (dyspnea) with or without activity.   You have rapid, fast breathing (tachypnea).   You are wheezing.  You are unable to say more than a few words without having to catch your breath.  You find it very difficult to function normally.  You have a fast heart rate.   You have a bluish color to your finger or toe nail beds.   You have confusion or drowsiness or both.  HOW WILL MY ACUTE RESPIRATORY FAILURE BE TREATED?  Treatment of acute respiratory failure depends on the cause of the respiratory failure. Usually, you will stay in the intensive care unit so your breathing can be watched closely. Treatment can include the following:  Oxygen. Oxygen can be delivered through the following:  Nasal cannula. This is small tubing that goes in your nose to give you oxygen.  Face mask. A face mask covers your nose and mouth to give you oxygen.  Medicine. Different medicines can be given to help with breathing. These can include:  Nebulizers. Nebulizers deliver medicines to open the air passages (bronchodilators). These medicines help to open or relax the airways in the lungs so you can breathe better. They can also help loosen mucus from your lungs.  Diuretics. Diuretic medicines can help you breathe better by getting rid of extra water in your body.  Steroids. Steroid medicines can help decrease swelling (inflammation) in your lungs.  Antibiotics.  Chest tube. If you have a collapsed lung (pneumothorax), a chest tube is placed to help reinflate the lung.  Non-invasive positive pressure ventilation (NPPV). This is a tight-fitting mask that goes over your nose and mouth. The mask has tubing that is attached to a machine. The machine blows air into the tubing, which helps to keep the tiny air sacs (alveoli) in your lungs open. This machine allows you to breathe on your own.  Ventilator. A ventilator is a breathing machine. When on a ventilator, a breathing tube is put into the  lungs. A ventilator is used when you can no longer breathe well enough on your own. You may have low oxygen levels or high carbon dioxide (CO2) levels in your blood. When you are on a ventilator, sedation and pain medicines are given to make you sleep so your lungs can heal. Document Released: 08/22/2013 Document Revised: 01/01/2014 Document Reviewed: 08/22/2013 [facility] Patient Information 2015 [place], [place]. This information is not intended to replace advice given to you by your health care provider. Make sure you discuss any questions you have with your health care provider.

## 2015-04-05 NOTE — Progress Notes (Signed)
Patient is resting comfortably on 5L [place]. Patient denies any respiratory discomfort. Pt's 02 sat ranging from 88%-90%. Patient does not require BIPAP at this time. RT will continue to monitor.

## 2015-04-06 DIAGNOSIS — J449 Chronic obstructive pulmonary disease, unspecified: Secondary | ICD-10-CM

## 2015-04-06 DIAGNOSIS — D696 Thrombocytopenia, unspecified: Secondary | ICD-10-CM

## 2015-04-06 LAB — BASIC METABOLIC PANEL
Anion gap: 7 (ref 5–15)
BUN: 29 mg/dL — ABNORMAL HIGH (ref 6–20)
CHLORIDE: 105 mmol/L (ref 101–111)
CO2: 23 mmol/L (ref 22–32)
Calcium: 8.3 mg/dL — ABNORMAL LOW (ref 8.9–10.3)
Creatinine, Ser: 1.06 mg/dL (ref 0.61–1.24)
GFR calc Af Amer: >60 mL/min (ref >60)
GFR calc non Af Amer: >60 mL/min (ref >60)
Glucose, Bld: 134 mg/dL — ABNORMAL HIGH (ref 65–99)
Potassium: 4.3 mmol/L (ref 3.5–5.1)
SODIUM: 135 mmol/L (ref 135–145)

## 2015-04-06 LAB — CBC
HCT: 37.6 % — ABNORMAL LOW (ref 39.0–52.0)
Hemoglobin: 12.6 g/dL — ABNORMAL LOW (ref 13.0–17.0)
MCH: 32.5 pg (ref 26.0–34.0)
MCHC: 33.5 g/dL (ref 30.0–36.0)
MCV: 96.9 fL (ref 78.0–100.0)
Platelets: 110 10*3/uL — ABNORMAL LOW (ref 150–400)
RBC: 3.88 MIL/uL — AB (ref 4.22–5.81)
RDW: 14.3 % (ref 11.5–15.5)
WBC: 11 10*3/uL — ABNORMAL HIGH (ref 4.0–10.5)

## 2015-04-06 MED ORDER — POLYVINYL ALCOHOL 1.4 % OP SOLN
1.0000 [drp] | OPHTHALMIC | Status: DC | PRN
  Administered 2015-04-06: 1 [drp] via OPHTHALMIC
  Filled 2015-04-06: qty 15

## 2015-04-06 NOTE — Progress Notes (Signed)
Patient ID: Joseph [name], male   DOB: [birth year], 74 y.o.   MRN: [id number]  [facility]  POD#: 2  Subjective: Patient up in bed, taking clear liquid diet.  No flatus, no BM.  Pain controlled.  OOB yesterday.  Wife at bedside.  Objective: Vital signs in last 24 hours: Temp:  [98.3 F (36.8 C)-100.4 F (38 C)] 98.3 F (36.8 C) (08/06 0400) Pulse Rate:  [13-125] 109 (08/06 0800) Resp:  [13-29] 14 (08/06 0800) BP: (97-139)/(67-89) 122/77 mmHg (08/06 0800) SpO2:  [89 %-95 %] 92 % (08/06 0800) Weight:  [100.4 kg (221 lb 5.5 oz)] 100.4 kg (221 lb 5.5 oz) (08/06 0442) Last BM Date:  (prior to admission )  Intake/Output from previous day: 08/05 0701 - 08/06 0700 In: 2160 [P.O.:360; I.V.:1635; IV Piggyback:165] Out: 1140 [Urine:780; Drains:360] Intake/Output this shift: Total I/O In: 75 [I.V.:75] Out: 20 [Urine:20]  Physical Exam: HEENT - sclerae clear, mucous membranes moist Neck - soft Chest - clear bilaterally Cor - RRR Abdomen - binder in place; drains with thin serosanguinous; dressing intact Ext - no edema, non-tender Neuro - alert & oriented, no focal deficits  Lab Results:   Recent Labs  04/05/15 0331 04/06/15 0336  WBC 13.3* 11.0*  HGB 14.2 12.6*  HCT 40.6 37.6*  PLT 121* 110*   BMET  Recent Labs  04/05/15 0331 04/06/15 0336  NA 136 135  K 5.3* 4.3  CL 106 105  CO2 23 23  GLUCOSE 157* 134*  BUN 32* 29*  CREATININE 1.22 1.06  CALCIUM 8.2* 8.3*   PT/INR No results for input(s): LABPROT, INR in the last 72 hours. Comprehensive Metabolic Panel:    Component Value Date/Time   NA 135 04/06/2015 0336   NA 136 04/05/2015 0331   NA 139 01/01/2015 1158   NA 143 07/03/2014 1024   K 4.3 04/06/2015 0336   K 5.3* 04/05/2015 0331   K 4.3 01/01/2015 1158   K 3.9 07/03/2014 1024   CL 105 04/06/2015 0336   CL 106 04/05/2015 0331   CL 109* 08/01/2012 1046   CL 105 04/25/2012 1059   CO2 23 04/06/2015 0336   CO2  23 04/05/2015 0331   CO2 25 01/01/2015 1158   CO2 25 07/03/2014 1024   BUN 29* 04/06/2015 0336   BUN 32* 04/05/2015 0331   BUN 22.5 01/01/2015 1158   BUN 19.4 07/03/2014 1024   CREATININE 1.06 04/06/2015 0336   CREATININE 1.22 04/05/2015 0331   CREATININE 0.9 01/01/2015 1158   CREATININE 1.0 07/03/2014 1024   CREATININE 1.00 08/17/2013 1250   CREATININE 0.71 07/30/2011 1640   GLUCOSE 134* 04/06/2015 0336   GLUCOSE 157* 04/05/2015 0331   GLUCOSE 94 01/01/2015 1158   GLUCOSE 96 07/03/2014 1024   GLUCOSE 93 08/01/2012 1046   GLUCOSE 79 04/25/2012 1059   CALCIUM 8.3* 04/06/2015 0336   CALCIUM 8.2* 04/05/2015 0331   CALCIUM 8.8 01/01/2015 1158   CALCIUM 9.3 07/03/2014 1024   AST 35 04/02/2015 1420   AST 27 01/01/2015 1158   AST 26 12/05/2014 0847   AST 29 07/03/2014 1024   ALT 33 04/02/2015 1420   ALT 20 01/01/2015 1158   ALT 19 12/05/2014 0847   ALT 25 07/03/2014 1024   ALKPHOS 138* 04/02/2015 1420   ALKPHOS 137 01/01/2015 1158   ALKPHOS 150* 12/05/2014 0847   ALKPHOS 145 07/03/2014 1024   BILITOT 1.9* 04/02/2015 1420   BILITOT 1.61* 01/01/2015 1158   BILITOT  1.2 12/05/2014 0847   BILITOT 1.33* 07/03/2014 1024   PROT 7.7 04/02/2015 1420   PROT 6.6 01/01/2015 1158   PROT 7.1 12/05/2014 0847   PROT 7.2 07/03/2014 1024   ALBUMIN 4.3 04/02/2015 1420   ALBUMIN 3.5 01/01/2015 1158   ALBUMIN 3.6 12/05/2014 0847   ALBUMIN 3.4* 07/03/2014 1024    Studies/Results: Portable Chest Xray - Atelectasis  04/04/2015   CLINICAL DATA:  Hypoxemia requiring supplemental oxygen. Postop hernia repair.  EXAM: PORTABLE CHEST - 1 VIEW  COMPARISON:  Chest CT 01/01/2015 and radiographs 07/15/2014  FINDINGS: A left subclavian Port-A-Cath remains in place with tip overlying the mid SVC. Cardiac silhouette is partially obscured but is grossly normal in size. Emphysematous changes are again seen as well as scarring in the medial right upper lobe. The patient has taken a shallower inspiration than on  the prior radiographs, and there is increased, mild opacity in both lung bases. No overt pulmonary edema, pleural effusion, or pneumothorax is identified. There is chronic elevation of the left hemidiaphragm.  IMPRESSION: 1. Shallow inspiration with mild bibasilar opacities, likely atelectasis. 2. COPD and chronic lung scarring.   Electronically Signed   By: Logan Bores   On: 04/04/2015 14:52    Anti-infectives: Anti-infectives    Start     Dose/Rate Route Frequency Ordered Stop   04/04/15 2000  ceFAZolin (ANCEF) IVPB 2 g/50 mL premix     2 g 100 mL/hr over 30 Minutes Intravenous 3 times per day 04/04/15 1514 04/04/15 2139   04/04/15 0951  polymyxin B 500,000 Units, bacitracin 50,000 Units in sodium chloride irrigation 0.9 % 500 mL irrigation  Status:  Discontinued       As needed 04/04/15 0951 04/04/15 1225   04/04/15 0625  ceFAZolin (ANCEF) IVPB 2 g/50 mL premix     2 g 100 mL/hr over 30 Minutes Intravenous On call to O.R. 04/04/15 0625 04/04/15 1145      Assessment & Plans: Status post ventral hernia repair, LOA  Continue clear liquid diet today  Encouraged IS use, OOB, ambulation with PT  Discontinue foley  Earnstine Regal, MD, [facility] Office: [place] 04/06/2015

## 2015-04-06 NOTE — Progress Notes (Signed)
Name: Joseph [name] MRN: [id number] DOB: 11/17/1940    ADMISSION DATE:  04/04/2015 CONSULTATION DATE:  8/4  REFERRING MD :  Redmond Pulling   CHIEF COMPLAINT:  Hypoxia   BRIEF PATIENT DESCRIPTION:  This is a 74 year old male w/ sig h/o COPD, AF, RUL IIIA/IIIB non-small cell lung cancer currently on observation since 2013. Admitted by surgical services for elective repair of ventral hernia. This consisted of a long complex procedure w/ complex lysis of adhesions and multiple layers of repair. Post-op course was complicated by hypoxia w/ sats 88% even on simple face-mask. PCCM was asked to see on arrival to the ICU for persistent acute hypoxia.  SIGNIFICANT EVENTS   STUDIES:   SUBJECTIVE:  Patient remained off of BiPAP. Reports continued abdominal pain. No nausea, emesis, BM, or flatulence. Denies any dyspnea and only mild pain in abdomen with coughing. No pleurisy. Cough is nonproductive.  ROS:  No subjective fever, chills, or sweats. No headache or vision changes.  VITAL SIGNS: Temp:  [98.3 F (36.8 C)-100.4 F (38 C)] 98.3 F (36.8 C) (08/06 0400) Pulse Rate:  [13-125] 109 (08/06 0800) Resp:  [13-29] 14 (08/06 0800) BP: (97-139)/(67-89) 122/77 mmHg (08/06 0800) SpO2:  [89 %-95 %] 92 % (08/06 0800) Weight:  [221 lb 5.5 oz (100.4 kg)] 221 lb 5.5 oz (100.4 kg) (08/06 0442)  PHYSICAL EXAMINATION: General:  NAD. Awake. Alert. Neuro:  Oriented x3. Moving all 4 extremities equally. HEENT:  MMM. No oral ulcers. No scleral icterus. Cardiovascular:  Regular rate. No edema. Unable to appreciate JVD. Lungs:  Mild crackles bilateral bases. Normal WOB on Boundary oxygen. Speaking in complete sentences. 1250cc on IS. Abdomen:  JP drains in place. Abdominal binder in place. Hypoactive BS. Musculoskeletal:  No joint effusion or deformity. Skin:   Warm & dry. No rash.   Recent Labs Lab 04/04/15 1235 04/05/15 0331 04/06/15 0336  NA 139 136 135  K 4.6 5.3* 4.3  CL 109 106 105  CO2 '24 23 23    '$ BUN 27* 32* 29*  CREATININE 1.16 1.22 1.06  GLUCOSE 179* 157* 134*    Recent Labs Lab 04/04/15 1235 04/05/15 0331 04/06/15 0336  HGB 15.4 14.2 12.6*  HCT 44.6 40.6 37.6*  WBC 12.4* 13.3* 11.0*  PLT 141* 121* 110*   Portable Chest Xray - Atelectasis  04/04/2015   CLINICAL DATA:  Hypoxemia requiring supplemental oxygen. Postop hernia repair.  EXAM: PORTABLE CHEST - 1 VIEW  COMPARISON:  Chest CT 01/01/2015 and radiographs 07/15/2014  FINDINGS: A left subclavian Port-A-Cath remains in place with tip overlying the mid SVC. Cardiac silhouette is partially obscured but is grossly normal in size. Emphysematous changes are again seen as well as scarring in the medial right upper lobe. The patient has taken a shallower inspiration than on the prior radiographs, and there is increased, mild opacity in both lung bases. No overt pulmonary edema, pleural effusion, or pneumothorax is identified. There is chronic elevation of the left hemidiaphragm.  IMPRESSION: 1. Shallow inspiration with mild bibasilar opacities, likely atelectasis. 2. COPD and chronic lung scarring.   Electronically Signed   By: Logan Bores   On: 04/04/2015 14:52    ASSESSMENT / PLAN:  Acute hypoxic respiratory failure  Post op atelectasis w/ decreased abd compliance due to abd surgery  COPD w/out evidence of acute exacerbation.  H/o lung cancer stg 3A s/p chemo -RT, last CT 12/2014 clear  Likely due to atelectasis with abdominal binder & positioning. Plan Continue Spiriva Albuterol  nebs prn wheezing IS education performed by me at bedside Pain management w/ morphine PCA   S/p ventral hernia repair Plan Diet and wound care per surgery  Morphine PCA  H/o gd I diastolic HF Plan On Lopressor bid  Mild thrombocytopenia Plan Recommend continuing to trend daily with CBC  H/o hypothyroidism Plan Continuing synthroid  Today's Summary:  74 y.o. Male with known h/o COPD s/p ventral hernia repair. Remains off of BiPAP  with normal mentation. IS education at bedside with wife present today. Continue OOB to chair as much as possible. Continuing home Spiriva. Patient needs f/u with pulmonary once he is discharged.  We will sign off now. Please notify us if we can be of any further assistance in the care of this patient.  Sonia Baller Ashok Cordia, M.D. [facility] Pager:  [phone] After 3pm or if no response, call [phone] 04/06/2015, 8:22 AM

## 2015-04-06 NOTE — Evaluation (Signed)
Occupational Therapy Evaluation Patient Details Name: Joseph [name] MRN: [id number] DOB: 1941/01/04 Today's Date: 04/06/2015    History of Present Illness pt was admitted for recurrent ventral incisional hernia repair (open).  His last hernia repair was 11/15. He has a h/o COPD and lung CA.   Clinical Impression   This 74 year old man was admitted for the above surgery.  He will benefit from skilled OT to increase safety and independence with bathroom transfers and standing for safe discharge home    Follow Up Recommendations  No OT follow up;Supervision/Assistance - 24 hour    Equipment Recommendations  None recommended by OT    Recommendations for Other Services       Precautions / Restrictions Precautions Precautions: Fall Precaution Comments: abdominal binder Restrictions Weight Bearing Restrictions: No      Mobility Bed Mobility Overal bed mobility: Needs Assistance Bed Mobility: Rolling;Sidelying to Sit Rolling: Min assist Sidelying to sit: Min assist       General bed mobility comments: light min A for bed mobility; flat bed, no rails. Cues to bend leg to roll  Transfers Overall transfer level: Needs assistance Equipment used: 1 person hand held assist Transfers: Sit to/from [employer] Sit to Stand: Min assist Stand pivot transfers: Min assist       General transfer comment: steadying assistance from high bed    Balance                                            ADL Overall ADL's : Needs assistance/impaired                         Toilet Transfer: Minimal assistance;Stand-pivot (to recliner)             General ADL Comments: Pt has had several surgeries and wife assisted as needed before. he needs overall setup for UB and max A for LB adls, sit to stand     Vision     Perception     Praxis      Pertinent Vitals/Pain Pain Assessment: 0-10 Pain Score: 5  Pain Location:  abdomen Pain Descriptors / Indicators: Sore Pain Intervention(s): Limited activity within patient's tolerance;Monitored during session;Repositioned;PCA encouraged     Hand Dominance     Extremity/Trunk Assessment Upper Extremity Assessment Upper Extremity Assessment: LUE deficits/detail LUE Deficits / Details: sore since sx:  can lift to 90 comfortably.  bil MMT deferred due to abd sx           Communication Communication Communication: No difficulties   Cognition Arousal/Alertness:  (awake but feeling sleepy) Behavior During Therapy: WFL for tasks assessed/performed Overall Cognitive Status: Within Functional Limits for tasks assessed                     General Comments       Exercises       Shoulder Instructions      Home Living Family/patient expects to be discharged to:: Private residence Living Arrangements: Spouse/significant other Available Help at Discharge: Family;Available 24 hours/day Type of Home: House             Bathroom Shower/Tub: Walk-in [employer] Toilet: Standard     Home Equipment: Bedside commode;Shower seat          Prior Functioning/Environment Level of Independence: Independent  Comments: did gardening    OT Diagnosis: Generalized weakness;Acute pain   OT Problem List: Decreased strength;Decreased activity tolerance;Impaired balance (sitting and/or standing);Pain   OT Treatment/Interventions: Self-care/ADL training;DME and/or AE instruction;Patient/family education;Balance training    OT Goals(Current goals can be found in the care plan section) Acute Rehab OT Goals Patient Stated Goal: get back to being independent OT Goal Formulation: With patient/family Time For Goal Achievement: 04/20/15 Potential to Achieve Goals: Good ADL Goals Pt Will Perform Grooming: with supervision;standing (2 tasks) Pt Will Transfer to Toilet: with supervision;bedside commode;ambulating Pt Will Perform Tub/Shower  Transfer: with min guard assist;ambulating;shower seat;Shower transfer  OT Frequency: Min 2X/week   Barriers to D/C:            Co-evaluation              End of Session    Activity Tolerance: Patient tolerated treatment well Patient left: in chair;with call bell/phone within reach   Time: 1034-1057 OT Time Calculation (min): 23 min Charges:  OT General Charges $OT Visit: 1 Procedure OT Evaluation $Initial OT Evaluation Tier I: 1 Procedure G-Codes:    Luster Hechler 15-Apr-2015, 11:23 AM  Lesle Chris, OTR/L [phone] 04-15-2015

## 2015-04-06 NOTE — Evaluation (Signed)
Physical Therapy Evaluation Patient Details Name: Joseph [name] MRN: [id number] DOB: Mar 05, 1941 Today's Date: 04/06/2015   History of Present Illness  pt was admitted for recurrent ventral incisional hernia repair (open).  His last hernia repair was 11/15. He has a h/o COPD and lung CA.  Clinical Impression  Patient tolerated  Ambulation but with HR max 122 and sats dipping to 85% on 6 . RN aware. Patient will benefit from PT to address problems listed below( see PT Problem List.    Follow Up Recommendations Home health PT;Supervision - Intermittent    Equipment Recommendations  None recommended by PT    Recommendations for Other Services       Precautions / Restrictions Precautions Precautions: Fall Precaution Comments: abdominal binder, monitor sats and HR, drains on R abd. Restrictions Weight Bearing Restrictions: No      Mobility  Bed Mobility Overal bed mobility: Needs Assistance Bed Mobility: Rolling;Sidelying to Sit Rolling: Min assist Sidelying to sit: Min assist       General bed mobility comments: in recliner  Transfers Overall transfer level: Needs assistance Equipment used: Rolling walker (2 wheeled) Transfers: Stand Pivot Transfers Sit to Stand: Min assist Stand pivot transfers: Min assist       General transfer comment: steadying assistance from high bed  Ambulation/Gait Ambulation/Gait assistance: Min assist;+2 safety/equipment Ambulation Distance (Feet): 80 Feet Assistive device: Rolling walker (2 wheeled) Gait Pattern/deviations: Step-through pattern     General Gait Details: slow  Stairs            Wheelchair Mobility    Modified Rankin (Stroke Patients Only)       Balance                                             Pertinent Vitals/Pain Pain Assessment: 0-10 Pain Score: 5  Pain Location: abdomen when coughs Pain Descriptors / Indicators: Cramping Pain Intervention(s): Monitored during  session;Repositioned;PCA encouraged    Home Living Family/patient expects to be discharged to:: Private residence Living Arrangements: Spouse/significant other Available Help at Discharge: Family;Available 24 hours/day Type of Home: House Home Access: Level entry     Home Layout: One level Home Equipment: Bedside commode;Shower seat      Prior Function Level of Independence: Independent         Comments: did gardening     Hand Dominance        Extremity/Trunk Assessment   Upper Extremity Assessment: LUE deficits/detail       LUE Deficits / Details: sore since sx:  can lift to 90 comfortably.  bil MMT deferred due to abd sx   Lower Extremity Assessment: Overall WFL for tasks assessed      Cervical / Trunk Assessment:  (guarded)  Communication   Communication: No difficulties  Cognition Arousal/Alertness: Awake/alert Behavior During Therapy: WFL for tasks assessed/performed Overall Cognitive Status: Within Functional Limits for tasks assessed                      General Comments      Exercises        Assessment/Plan    PT Assessment Patient needs continued PT services  PT Diagnosis Difficulty walking;Generalized weakness;Acute pain   PT Problem List Decreased strength;Decreased activity tolerance;Decreased range of motion;Decreased mobility;Decreased knowledge of use of DME;Decreased safety awareness;Decreased knowledge of precautions;Pain;Cardiopulmonary status limiting activity  PT Treatment Interventions  DME instruction;Gait training;Functional mobility training;Therapeutic activities;Patient/family education   PT Goals (Current goals can be found in the Care Plan section) Acute Rehab PT Goals Patient Stated Goal: get back to being independent PT Goal Formulation: With patient/family Time For Goal Achievement: 04/20/15 Potential to Achieve Goals: Good    Frequency Min 3X/week   Barriers to discharge        Co-evaluation                End of Session Equipment Utilized During Treatment: Oxygen;Other (comment) (abdominal binder) Activity Tolerance: Patient tolerated treatment well Patient left: in chair;with call bell/phone within reach;with family/visitor present Nurse Communication: Mobility status         Time: 1210-1229 PT Time Calculation (min) (ACUTE ONLY): 19 min   Charges:   PT Evaluation $Initial PT Evaluation Tier I: 1 Procedure     PT G CodesClaretha Cooper 04/06/2015, 2:26 PM Tresa Endo PT [phone]

## 2015-04-07 LAB — CBC
HEMATOCRIT: 34.6 % — AB (ref 39.0–52.0)
HEMOGLOBIN: 11.9 g/dL — AB (ref 13.0–17.0)
MCH: 33.2 pg (ref 26.0–34.0)
MCHC: 34.4 g/dL (ref 30.0–36.0)
MCV: 96.6 fL (ref 78.0–100.0)
Platelets: 121 10*3/uL — ABNORMAL LOW (ref 150–400)
RBC: 3.58 MIL/uL — AB (ref 4.22–5.81)
RDW: 13.9 % (ref 11.5–15.5)
WBC: 8.3 10*3/uL (ref 4.0–10.5)

## 2015-04-07 LAB — BASIC METABOLIC PANEL
Anion gap: 3 — ABNORMAL LOW (ref 5–15)
BUN: 17 mg/dL (ref 6–20)
CO2: 22 mmol/L (ref 22–32)
Calcium: 7.8 mg/dL — ABNORMAL LOW (ref 8.9–10.3)
Chloride: 106 mmol/L (ref 101–111)
Creatinine, Ser: 0.86 mg/dL (ref 0.61–1.24)
GFR calc Af Amer: >60 mL/min (ref >60)
GLUCOSE: 128 mg/dL — AB (ref 65–99)
Potassium: 4 mmol/L (ref 3.5–5.1)
SODIUM: 131 mmol/L — AB (ref 135–145)

## 2015-04-07 MED ORDER — GUAIFENESIN-DM 100-10 MG/5ML PO SYRP
5.0000 mL | ORAL_SOLUTION | ORAL | Status: DC | PRN
  Filled 2015-04-07: qty 10

## 2015-04-07 MED ORDER — KCL IN DEXTROSE-NACL 20-5-0.45 MEQ/L-%-% IV SOLN
INTRAVENOUS | Status: DC
  Administered 2015-04-07 (×2): via INTRAVENOUS
  Filled 2015-04-07 (×2): qty 1000

## 2015-04-07 NOTE — Progress Notes (Signed)
Pt is resting comfortably on 5Lpm nasal cannula SpO2-91% RR-20. Pt shows no signs of distress at. Bipap not needed at this time. RT will continue to monitor as needed.

## 2015-04-07 NOTE — Progress Notes (Signed)
Patient ID: Joseph [name], male   DOB: [birth year], 74 y.o.   MRN: [id number]  [facility]  POD#: 3  Subjective: Patient awake, in bed, wife at bedside.  Mild pain.  Trying to cough.  Tolerating clear liquids - passing flatus.  Objective: Vital signs in last 24 hours: Temp:  [98 F (36.7 C)-100.5 F (38.1 C)] 98.5 F (36.9 C) (08/07 0400) Pulse Rate:  [96-125] 117 (08/07 0400) Resp:  [14-29] 18 (08/07 0800) BP: (87-146)/(64-87) 121/77 mmHg (08/07 0400) SpO2:  [87 %-97 %] 92 % (08/07 0800) Weight:  [101.8 kg (224 lb 6.9 oz)] 101.8 kg (224 lb 6.9 oz) (08/07 0400) Last BM Date: 04/03/15  Intake/Output from previous day: 08/06 0701 - 08/07 0700 In: 3310 [P.O.:960; I.V.:1800; IV Piggyback:550] Out: 1032 [Urine:796; Drains:236] Intake/Output this shift:    Physical Exam: HEENT - sclerae clear, mucous membranes moist Neck - soft Chest - clear bilaterally Cor - mild tachycardia Abdomen - mild distension; wound dry and intact; JP's (3) with serosanguinous; active BS present Ext - no edema, non-tender Neuro - alert & oriented, no focal deficits  Lab Results:   Recent Labs  04/06/15 0336 04/07/15 0350  WBC 11.0* 8.3  HGB 12.6* 11.9*  HCT 37.6* 34.6*  PLT 110* 121*   BMET  Recent Labs  04/06/15 0336 04/07/15 0350  NA 135 131*  K 4.3 4.0  CL 105 106  CO2 23 22  GLUCOSE 134* 128*  BUN 29* 17  CREATININE 1.06 0.86  CALCIUM 8.3* 7.8*   PT/INR No results for input(s): LABPROT, INR in the last 72 hours. Comprehensive Metabolic Panel:    Component Value Date/Time   NA 131* 04/07/2015 0350   NA 135 04/06/2015 0336   NA 139 01/01/2015 1158   NA 143 07/03/2014 1024   K 4.0 04/07/2015 0350   K 4.3 04/06/2015 0336   K 4.3 01/01/2015 1158   K 3.9 07/03/2014 1024   CL 106 04/07/2015 0350   CL 105 04/06/2015 0336   CL 109* 08/01/2012 1046   CL 105 04/25/2012 1059   CO2 22 04/07/2015 0350   CO2 23 04/06/2015 0336   CO2 25  01/01/2015 1158   CO2 25 07/03/2014 1024   BUN 17 04/07/2015 0350   BUN 29* 04/06/2015 0336   BUN 22.5 01/01/2015 1158   BUN 19.4 07/03/2014 1024   CREATININE 0.86 04/07/2015 0350   CREATININE 1.06 04/06/2015 0336   CREATININE 0.9 01/01/2015 1158   CREATININE 1.0 07/03/2014 1024   CREATININE 1.00 08/17/2013 1250   CREATININE 0.71 07/30/2011 1640   GLUCOSE 128* 04/07/2015 0350   GLUCOSE 134* 04/06/2015 0336   GLUCOSE 94 01/01/2015 1158   GLUCOSE 96 07/03/2014 1024   GLUCOSE 93 08/01/2012 1046   GLUCOSE 79 04/25/2012 1059   CALCIUM 7.8* 04/07/2015 0350   CALCIUM 8.3* 04/06/2015 0336   CALCIUM 8.8 01/01/2015 1158   CALCIUM 9.3 07/03/2014 1024   AST 35 04/02/2015 1420   AST 27 01/01/2015 1158   AST 26 12/05/2014 0847   AST 29 07/03/2014 1024   ALT 33 04/02/2015 1420   ALT 20 01/01/2015 1158   ALT 19 12/05/2014 0847   ALT 25 07/03/2014 1024   ALKPHOS 138* 04/02/2015 1420   ALKPHOS 137 01/01/2015 1158   ALKPHOS 150* 12/05/2014 0847   ALKPHOS 145 07/03/2014 1024   BILITOT 1.9* 04/02/2015 1420   BILITOT 1.61* 01/01/2015 1158   BILITOT 1.2 12/05/2014 0847   BILITOT 1.33*  07/03/2014 1024   PROT 7.7 04/02/2015 1420   PROT 6.6 01/01/2015 1158   PROT 7.1 12/05/2014 0847   PROT 7.2 07/03/2014 1024   ALBUMIN 4.3 04/02/2015 1420   ALBUMIN 3.5 01/01/2015 1158   ALBUMIN 3.6 12/05/2014 0847   ALBUMIN 3.4* 07/03/2014 1024    Studies/Results: No results found.  Anti-infectives: Anti-infectives    Start     Dose/Rate Route Frequency Ordered Stop   04/04/15 2000  ceFAZolin (ANCEF) IVPB 2 g/50 mL premix     2 g 100 mL/hr over 30 Minutes Intravenous 3 times per day 04/04/15 1514 04/04/15 2139   04/04/15 0951  polymyxin B 500,000 Units, bacitracin 50,000 Units in sodium chloride irrigation 0.9 % 500 mL irrigation  Status:  Discontinued       As needed 04/04/15 0951 04/04/15 1225   04/04/15 0625  ceFAZolin (ANCEF) IVPB 2 g/50 mL premix     2 g 100 mL/hr over 30 Minutes Intravenous  On call to O.R. 04/04/15 0625 04/04/15 1145      Assessment & Plans: Status post ventral hernia repair, LOA  Advance to full liquid diet this AM Encouraged IS use, OOB, ambulation with PT Foley out  CCM / pulmonary following  Earnstine Regal, MD, [facility] Office: [place] 04/07/2015

## 2015-04-08 ENCOUNTER — Encounter (HOSPITAL_COMMUNITY): Payer: Self-pay | Admitting: Surgery

## 2015-04-08 LAB — BASIC METABOLIC PANEL
ANION GAP: 2 — AB (ref 5–15)
BUN: 14 mg/dL (ref 6–20)
CALCIUM: 7.7 mg/dL — AB (ref 8.9–10.3)
CO2: 24 mmol/L (ref 22–32)
Chloride: 106 mmol/L (ref 101–111)
Creatinine, Ser: 0.77 mg/dL (ref 0.61–1.24)
GFR calc non Af Amer: >60 mL/min (ref >60)
GLUCOSE: 113 mg/dL — AB (ref 65–99)
Potassium: 4.1 mmol/L (ref 3.5–5.1)
Sodium: 132 mmol/L — ABNORMAL LOW (ref 135–145)

## 2015-04-08 LAB — CBC
HCT: 31 % — ABNORMAL LOW (ref 39.0–52.0)
Hemoglobin: 10.9 g/dL — ABNORMAL LOW (ref 13.0–17.0)
MCH: 33.9 pg (ref 26.0–34.0)
MCHC: 35.2 g/dL (ref 30.0–36.0)
MCV: 96.3 fL (ref 78.0–100.0)
Platelets: 114 10*3/uL — ABNORMAL LOW (ref 150–400)
RBC: 3.22 MIL/uL — AB (ref 4.22–5.81)
RDW: 13.8 % (ref 11.5–15.5)
WBC: 5.1 10*3/uL (ref 4.0–10.5)

## 2015-04-08 MED ORDER — MORPHINE SULFATE 2 MG/ML IJ SOLN: 2.0000 mg | INTRAMUSCULAR | Status: DC | PRN

## 2015-04-08 MED ORDER — OXYCODONE HCL 5 MG PO TABS
5.0000 mg | ORAL_TABLET | ORAL | Status: DC | PRN
Start: 2015-04-08 — End: 2015-04-10
  Administered 2015-04-08 – 2015-04-10 (×5): 5 mg via ORAL
  Filled 2015-04-08 (×5): qty 1

## 2015-04-08 MED ORDER — SODIUM CHLORIDE 0.9 % IV SOLN: 250.0000 mL | INTRAVENOUS | Status: DC | PRN

## 2015-04-08 MED ORDER — PROMETHAZINE HCL 25 MG/ML IJ SOLN: 6.2500 mg | INTRAMUSCULAR | Status: DC | PRN

## 2015-04-08 MED ORDER — POLYETHYLENE GLYCOL 3350 17 G PO PACK
17.0000 g | PACK | Freq: Two times a day (BID) | ORAL | Status: DC
  Administered 2015-04-08 (×2): 17 g via ORAL
  Filled 2015-04-08 (×6): qty 1

## 2015-04-08 MED ORDER — ACETAMINOPHEN 325 MG PO TABS: 325.0000 mg | ORAL_TABLET | Freq: Four times a day (QID) | ORAL | Status: DC | PRN

## 2015-04-08 MED ORDER — SODIUM CHLORIDE 0.9 % IJ SOLN
3.0000 mL | Freq: Two times a day (BID) | INTRAMUSCULAR | Status: DC
  Administered 2015-04-08 – 2015-04-10 (×3): 3 mL via INTRAVENOUS

## 2015-04-08 MED ORDER — MAGIC MOUTHWASH
15.0000 mL | Freq: Four times a day (QID) | ORAL | Status: DC | PRN
  Filled 2015-04-08: qty 15

## 2015-04-08 MED ORDER — SACCHAROMYCES BOULARDII 250 MG PO CAPS
250.0000 mg | ORAL_CAPSULE | Freq: Two times a day (BID) | ORAL | Status: DC
  Administered 2015-04-08 – 2015-04-10 (×5): 250 mg via ORAL
  Filled 2015-04-08 (×6): qty 1

## 2015-04-08 MED ORDER — ACETAMINOPHEN 500 MG PO TABS: 1000.0000 mg | ORAL_TABLET | Freq: Three times a day (TID) | ORAL | Status: DC

## 2015-04-08 MED ORDER — BISACODYL 10 MG RE SUPP: 10.0000 mg | Freq: Every day | RECTAL | Status: DC

## 2015-04-08 MED ORDER — SODIUM CHLORIDE 0.9 % IJ SOLN: 3.0000 mL | INTRAMUSCULAR | Status: DC | PRN

## 2015-04-08 MED ORDER — ALUM & MAG HYDROXIDE-SIMETH 200-200-20 MG/5ML PO SUSP: 30.0000 mL | Freq: Four times a day (QID) | ORAL | Status: DC | PRN

## 2015-04-08 MED ORDER — MENTHOL 3 MG MT LOZG: 1.0000 | LOZENGE | OROMUCOSAL | Status: DC | PRN

## 2015-04-08 MED ORDER — ENSURE ENLIVE PO LIQD
237.0000 mL | Freq: Three times a day (TID) | ORAL | Status: DC
  Administered 2015-04-08 – 2015-04-09 (×5): 237 mL via ORAL

## 2015-04-08 MED ORDER — LIP MEDEX EX OINT
1.0000 "application " | TOPICAL_OINTMENT | Freq: Two times a day (BID) | CUTANEOUS | Status: DC
  Administered 2015-04-08 – 2015-04-10 (×5): 1 via TOPICAL
  Filled 2015-04-08: qty 7

## 2015-04-08 MED ORDER — PHENOL 1.4 % MT LIQD: 2.0000 | OROMUCOSAL | Status: DC | PRN

## 2015-04-08 MED ORDER — FUROSEMIDE 20 MG PO TABS
20.0000 mg | ORAL_TABLET | Freq: Two times a day (BID) | ORAL | Status: DC
  Administered 2015-04-08 – 2015-04-10 (×4): 20 mg via ORAL
  Filled 2015-04-08 (×7): qty 1

## 2015-04-08 MED ORDER — ADULT MULTIVITAMIN W/MINERALS CH
1.0000 | ORAL_TABLET | Freq: Every morning | ORAL | Status: DC
  Administered 2015-04-08 – 2015-04-10 (×3): 1 via ORAL
  Filled 2015-04-08 (×4): qty 1

## 2015-04-08 MED ORDER — NAPROXEN SODIUM 550 MG PO TABS
550.0000 mg | ORAL_TABLET | Freq: Two times a day (BID) | ORAL | Status: DC
  Administered 2015-04-08 – 2015-04-10 (×4): 550 mg via ORAL
  Filled 2015-04-08 (×6): qty 1

## 2015-04-08 MED ORDER — ACETAMINOPHEN 650 MG RE SUPP: 650.0000 mg | Freq: Four times a day (QID) | RECTAL | Status: DC | PRN

## 2015-04-08 NOTE — Progress Notes (Signed)
Date:  April 08, 2015 U.R. performed for needs and level of care. Will continue to follow for Case Management needs.  Rhonda Davis, RN, BSN, CCM   [phone] 

## 2015-04-08 NOTE — Progress Notes (Signed)
Patient output 200 refusing Lasix.  Discussed with patient the need for Lasix as ordered.  Patient stated understanding and took med at [facility]

## 2015-04-08 NOTE — Progress Notes (Signed)
Physical Therapy Treatment Patient Details Name: ERRIC MACHNIK MRN: [id number] DOB: [birth year] Today's Date: 04/08/2015    History of Present Illness pt was admitted for recurrent ventral incisional hernia repair (open).  His last hernia repair was 11/15. He has a h/o COPD and lung CA.    PT Comments    Progressing with mobility. O2 sats 86% on 4L, HR 120s bpm while ambulating. Pt tolerated activity fairly well.   Follow Up Recommendations  Home health PT;Supervision - Intermittent     Equipment Recommendations  None recommended by PT    Recommendations for Other Services       Precautions / Restrictions Precautions Precautions: Fall Precaution Comments: abdominal binder, monitor sats and HR, drains on R abd. Restrictions Weight Bearing Restrictions: No    Mobility  Bed Mobility Overal bed mobility: Needs Assistance Bed Mobility: Sit to Sidelying         Sit to sidelying: Min assist General bed mobility comments: small amount of assist for LEs onto bed. Increased time. VCs safety, technique  Transfers Overall transfer level: Needs assistance Equipment used: Rolling walker (2 wheeled) Transfers: Sit to/from Stand Sit to Stand: Min guard         General transfer comment: close guard for safety. VCs hand placement  Ambulation/Gait Ambulation/Gait assistance: Min assist Ambulation Distance (Feet): 160 Feet Assistive device: Rolling walker (2 wheeled) Gait Pattern/deviations: Step-through pattern     General Gait Details: slow gait speed. O2 sats 86% 4L O2, HR 120s bpm while ambulating. 1 brief standing rest break needed/taken.    Stairs            Wheelchair Mobility    Modified Rankin (Stroke Patients Only)       Balance                                    Cognition Arousal/Alertness: Awake/alert Behavior During Therapy: WFL for tasks assessed/performed Overall Cognitive Status: Within Functional Limits for tasks  assessed                      Exercises      General Comments        Pertinent Vitals/Pain Pain Assessment: Faces Faces Pain Scale: Hurts little more Pain Location: abdomen Pain Descriptors / Indicators: Sore Pain Intervention(s): Monitored during session;Repositioned    Home Living                      Prior Function            PT Goals (current goals can now be found in the care plan section) Progress towards PT goals: Progressing toward goals    Frequency  Min 3X/week    PT Plan Current plan remains appropriate    Co-evaluation             End of Session Equipment Utilized During Treatment: Oxygen (abd binder) Activity Tolerance: Patient tolerated treatment well Patient left: in bed;with call bell/phone within reach;with family/visitor present     Time: 0258-5277 PT Time Calculation (min) (ACUTE ONLY): 13 min  Charges:  $Gait Training: 8-22 mins                    G Codes:      Weston Anna, MPT Pager: [phone]

## 2015-04-08 NOTE — Progress Notes (Addendum)
[facility]  [address] [place], [place] [address] Phone: [phone] FAX: [phone]   Joseph [name] [id number] [birth year]   Problem List:   Active Problems:   Recurrent ventral incisional hernia   Hypoxemia requiring supplemental oxygen   4 Days Post-Op    POSTOPERATIVE DIAGNOSIS: Recurrent ventral incisional hernia, intraabdominal adhesions.  PROCEDURE: 1. Open retro-rectus repair of recurrent ventral incisional hernia  with mesh. 2. Bilateral posterior component separation-TAR. 3. Lysis of adhesions for 1 hour.  SURGEON: [name]. Redmond Pulling, MD, FACS  ASSISTANT SURGEONS: Kyal Boston, MD  ANESTHESIA: General.  Assessment  Ileus resolving  Plan:  -adv to solids -bowel regimen -BP control -wean off PCA - switch to more PO pain control w IV backup -VTE prophylaxis- SCDs, etc -mobilize as tolerated to help recovery - GET HIM UP! -set up [facility] for PT at least  I updated the status of the patient to the patient and the patient's wife.  ICU RNs as well.  I made recommendations.  I answered questions.  Understanding & appreciation was expressed.  Adin Hector, M.D., F.A.C.S. Gastrointestinal and Minimally Invasive Surgery [facility] [address] [place], [place] [address] [phone] Main / Paging   04/08/2015  Subjective:  Sore but better Appetite fair Wife & RNs in room  Objective:  Vital signs:  Filed Vitals:   04/08/15 0300 04/08/15 0400 04/08/15 0500 04/08/15 0600  BP: 110/70 102/66 90/58 104/65  Pulse: 97 91 91 95  Temp:   97.9 F (36.6 C)   TempSrc:   Oral   Resp: '22 20 24 23  ' Height:      Weight:      SpO2: 92% 93% 93% 94%    Last BM Date: 04/03/15  Intake/Output   Yesterday:  08/07 0701 - 08/08 0700 In: 1477.5 [I.V.:1477.5] Out: 3276 [Urine:1275; Drains:117] This shift:     Bowel function:  Flatus: y  BM: y  Drain:  min serosanguinous  Physical Exam:  General: Pt awake/alert/oriented x4 in no acute distress Eyes: PERRL, normal EOM.  Sclera clear.  No icterus Neuro: CN II-XII intact w/o focal sensory/motor deficits. Lymph: No head/neck/groin lymphadenopathy Psych:  No delerium/psychosis/paranoia HENT: Normocephalic, Mucus membranes moist.  No thrush Neck: Supple, No tracheal deviation Chest: No chest wall pain w good excursion CV:  Pulses intact.  Regular rhythm MS: Normal AROM mjr joints.  No obvious deformity Abdomen: Soft.  Nondistended.  Mildly tender at incisions only.  No evidence of peritonitis.  No incarcerated hernias. Ext:  SCDs BLE.  No mjr edema.  No cyanosis Skin: No petechiae / purpura  Results:   Labs: Results for orders placed or performed during the hospital encounter of 04/04/15 (from the past 48 hour(s))  CBC     Status: Abnormal   Collection Time: 04/07/15  3:50 AM  Result Value Ref Range   WBC 8.3 4.0 - 10.5 K/uL   RBC 3.58 (L) 4.22 - 5.81 MIL/uL   Hemoglobin 11.9 (L) 13.0 - 17.0 g/dL   HCT 34.6 (L) 39.0 - 52.0 %   MCV 96.6 78.0 - 100.0 fL   MCH 33.2 26.0 - 34.0 pg   MCHC 34.4 30.0 - 36.0 g/dL   RDW 13.9 11.5 - 15.5 %   Platelets 121 (L) 150 - 400 K/uL  Basic metabolic panel     Status: Abnormal   Collection Time: 04/07/15  3:50 AM  Result Value Ref Range   Sodium  131 (L) 135 - 145 mmol/L   Potassium 4.0 3.5 - 5.1 mmol/L   Chloride 106 101 - 111 mmol/L   CO2 22 22 - 32 mmol/L   Glucose, Bld 128 (H) 65 - 99 mg/dL   BUN 17 6 - 20 mg/dL   Creatinine, Ser 0.86 0.61 - 1.24 mg/dL   Calcium 7.8 (L) 8.9 - 10.3 mg/dL   GFR calc non Af Amer >60 >60 mL/min   GFR calc Af Amer >60 >60 mL/min    Comment: (NOTE) The eGFR has been calculated using the CKD EPI equation. This calculation has not been validated in all clinical situations. eGFR's persistently <60 mL/min signify possible Chronic Kidney Disease.    Anion gap 3 (L) 5 - 15  CBC     Status: Abnormal   Collection  Time: 04/08/15  3:41 AM  Result Value Ref Range   WBC 5.1 4.0 - 10.5 K/uL   RBC 3.22 (L) 4.22 - 5.81 MIL/uL   Hemoglobin 10.9 (L) 13.0 - 17.0 g/dL   HCT 31.0 (L) 39.0 - 52.0 %   MCV 96.3 78.0 - 100.0 fL   MCH 33.9 26.0 - 34.0 pg   MCHC 35.2 30.0 - 36.0 g/dL   RDW 13.8 11.5 - 15.5 %   Platelets 114 (L) 150 - 400 K/uL    Comment: CONSISTENT WITH PREVIOUS RESULT  Basic metabolic panel     Status: Abnormal   Collection Time: 04/08/15  3:41 AM  Result Value Ref Range   Sodium 132 (L) 135 - 145 mmol/L    Comment: REPEATED TO VERIFY   Potassium 4.1 3.5 - 5.1 mmol/L   Chloride 106 101 - 111 mmol/L    Comment: REPEATED TO VERIFY   CO2 24 22 - 32 mmol/L    Comment: REPEATED TO VERIFY   Glucose, Bld 113 (H) 65 - 99 mg/dL   BUN 14 6 - 20 mg/dL   Creatinine, Ser 0.77 0.61 - 1.24 mg/dL   Calcium 7.7 (L) 8.9 - 10.3 mg/dL   GFR calc non Af Amer >60 >60 mL/min   GFR calc Af Amer >60 >60 mL/min    Comment: (NOTE) The eGFR has been calculated using the CKD EPI equation. This calculation has not been validated in all clinical situations. eGFR's persistently <60 mL/min signify possible Chronic Kidney Disease.    Anion gap 2 (L) 5 - 15    Imaging / Studies: No results found.  Medications / Allergies: per chart  Antibiotics: Anti-infectives    Start     Dose/Rate Route Frequency Ordered Stop   04/04/15 2000  ceFAZolin (ANCEF) IVPB 2 g/50 mL premix     2 g 100 mL/hr over 30 Minutes Intravenous 3 times per day 04/04/15 1514 04/04/15 2139   04/04/15 0951  polymyxin B 500,000 Units, bacitracin 50,000 Units in sodium chloride irrigation 0.9 % 500 mL irrigation  Status:  Discontinued       As needed 04/04/15 0951 04/04/15 1225   04/04/15 0625  ceFAZolin (ANCEF) IVPB 2 g/50 mL premix     2 g 100 mL/hr over 30 Minutes Intravenous On call to O.R. 04/04/15 0625 04/04/15 1145        Note: Portions of this report may have been transcribed using voice recognition software. Every effort was  made to ensure accuracy; however, inadvertent computerized transcription errors may be present.   Any transcriptional errors that result from this process are unintentional.     Adin Hector, M.D., F.A.C.S.  Gastrointestinal and Minimally Invasive Surgery [facility] [address], [place], [place] [address] [phone] Main / Paging   04/08/2015  CARE TEAM:  PCP: [name], MD  Outpatient Care Team: Patient Care Team: [name], MD as PCP - General (Internal Medicine) Nicanor Alcon, MD (Thoracic Surgery) Curt Bears, MD as Consulting Physician (Oncology) Dorothy Spark, MD as Consulting Physician (Cardiology) Greer Pickerel, MD as Consulting Physician (General Surgery)  Inpatient Treatment Team: Treatment Team: Attending Provider: Lawerence Boston, MD; Registered Nurse: Lynnell Dike, RN; Registered Nurse: Danie Chandler, RN; Technician: Larene Beach, NT

## 2015-04-08 NOTE — Progress Notes (Signed)
OT Cancellation Note  Patient Details Name: Joseph [name] MRN: [id number] DOB: [birth year]   Cancelled Treatment:    Reason Eval/Treat Not Completed: Other (comment) Pt just getting lunch tray when OT arrived. Informed nursing that OT will likely check back next date.  Jules Schick  [phone] 04/08/2015, 1:02 PM

## 2015-04-08 NOTE — Care Management Important Message (Signed)
Important Message  Patient Details  Name: Joseph [name] MRN: [id number] Date of Birth: 1941/03/02   Medicare Important Message Given:  [facility] notification given    Camillo Flaming 04/08/2015, 12:00 [place] Message  Patient Details  Name: Joseph [name] MRN: [id number] Date of Birth: July 09, 1941   Medicare Important Message Given:  Yes-second notification given    Camillo Flaming 04/08/2015, 12:00 PM

## 2015-04-09 MED ORDER — POLYETHYLENE GLYCOL 3350 17 G PO PACK
51.0000 g | PACK | Freq: Once | ORAL | Status: AC
  Administered 2015-04-09: 51 g via ORAL
  Filled 2015-04-09: qty 3

## 2015-04-09 MED ORDER — METHOCARBAMOL 500 MG PO TABS: 1000.0000 mg | ORAL_TABLET | Freq: Four times a day (QID) | ORAL | Status: DC | PRN

## 2015-04-09 NOTE — Progress Notes (Signed)
Occupational Therapy Treatment Patient Details Name: Joseph [name] MRN: [id number] DOB: Feb 07, 1941 Today's Date: 04/09/2015    History of present illness pt was admitted for recurrent ventral incisional hernia repair (open).  His last hernia repair was 11/15. He has a h/o COPD and lung CA.   OT comments  Pt did well up to the bathroom to perform grooming and toilet transfer. Wife present for session and also discussed AE options that would benefit pt including toilet aid and LHS.    Follow Up Recommendations  No OT follow up;Supervision/Assistance - 24 hour    Equipment Recommendations  None recommended by OT    Recommendations for Other Services      Precautions / Restrictions Precautions Precautions: Fall Precaution Comments: abdominal binder, monitor sats and HR, drains        Mobility Bed Mobility Overal bed mobility: Needs Assistance Bed Mobility: Rolling Rolling: Min guard Sidelying to sit: Min guard       General bed mobility comments: use of rail and increased time to push up to sitting.   Transfers Overall transfer level: Needs assistance Equipment used: None Transfers: Sit to/from Stand Sit to Stand: Min guard         General transfer comment: min guard for safety.    Balance                                   ADL Overall ADL's : Needs assistance/impaired     Grooming: Oral care;Min guard;Standing                   Toilet Transfer: Minimal assistance;Ambulation;Comfort height toilet;Grab bars             General ADL Comments: Pt stood for grooming at the sink and stated it was a little fatiguing after activity. Needed to have BM so sat on commode and able to have BM. Discussed several pieces of Ae that may benefit pt including long handled sponge and toilet aid. Pt has difficulty reaching around to posterior periarea to clean after BM so showed pt a picture of toilet aid and explained where to obtain. Demonstrated  all pieces of AE kit but pt and wife seemed mainly interested in long handle sponge. O2 sats on 4L at 90% after activity but up to 95% with rest and PLB.      Vision                     Perception     Praxis      Cognition   Behavior During Therapy: WFL for tasks assessed/performed Overall Cognitive Status: Within Functional Limits for tasks assessed                       Extremity/Trunk Assessment               Exercises     Shoulder Instructions       General Comments      Pertinent Vitals/ Pain       Pain Assessment: 0-10 Pain Score: 2  Pain Location: abdomen Pain Descriptors / Indicators: Sore Pain Intervention(s): Repositioned  Home Living                                          Prior Functioning/Environment  Frequency Min 2X/week     Progress Toward Goals  OT Goals(current goals can now be found in the care plan section)  Progress towards OT goals: Progressing toward goals     Plan Discharge plan remains appropriate    Co-evaluation                 End of Session Equipment Utilized During Treatment: Oxygen   Activity Tolerance Patient tolerated treatment well   Patient Left in chair;with call bell/phone within reach;with family/visitor present   Nurse Communication          Time: 3735-7897 OT Time Calculation (min): 26 min  Charges: OT General Charges $OT Visit: 1 Procedure OT Treatments $Self Care/Home Management : 8-22 mins $Therapeutic Activity: 8-22 mins  Jules Schick  [phone] 04/09/2015, 9:40 AM

## 2015-04-09 NOTE — Care Management Note (Signed)
Case Management Note  Patient Details  Name: Joseph [name] MRN: [id number] Date of Birth: 12/14/1940  Subjective/Objective:                    Action/Plan:home with [facility]   Expected Discharge Date:                  Expected Discharge Plan:  [place]  In-House Referral:  NA  Discharge planning Services  CM Consult  Post Acute Care Choice:  NA Choice offered to:  NA  DME Arranged:    DME Agency:     HH Arranged:  PT, NA HH Agency:  [facility]  Status of Service:  In process, will continue to follow  Medicare Important Message Given:  Yes-second notification given Date Medicare IM Given:    Medicare IM give by:    Date Additional Medicare IM Given:    Additional Medicare Important Message give by:     If discussed at [place] of Stay Meetings, dates discussed:    Additional CommentsPurcell Mouton, RN 04/09/2015, 9:54 AM

## 2015-04-09 NOTE — Progress Notes (Signed)
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  PROCEDURE: 1. Open retro-rectus repair of recurrent ventral incisional hernia  with mesh. 2. Bilateral posterior component separation-TAR. 3. Lysis of adhesions for 1 hour.  SURGEON: [name]. Redmond Pulling, MD, FACS  ASSISTANT SURGEONS: Dashan Boston, MD  ANESTHESIA: General.  Assessment  Ileus resolving  Plan:  -adv to [facility] solids -bowel regimen.  Try laxative -BP control -more PO pain control w IV backup -VTE prophylaxis- SCDs, etc -mobilize as tolerated to help recovery - GET HIM UP! -set up [facility] for PT at least  I updated the status of the patient to the patient and the patient's wife.  ICU RNs as well.  I made recommendations.  I answered questions.  Understanding & appreciation was expressed.  Adin Hector, M.D., F.A.C.S. Gastrointestinal and Minimally Invasive Surgery [facility] [address], [place], [place] [address] [phone] Main / Paging   04/09/2015  Subjective:  Sore but better Appetite better On floor Wife in room  Objective:  Vital signs:  Filed Vitals:   04/08/15 2110 04/09/15 0100 04/09/15 0153 04/09/15 0604  BP: 106/64  94/57 125/67  Pulse: 100  90 92  Temp: 97.9 F (36.6 C) 98.6 F (37 C) 98.4 F (36.9 C) 98 F (36.7 C)  TempSrc: Oral  Oral Oral  Resp: '18  18 21  ' Height:       Weight:      SpO2: 94%  96% 98%    Last BM Date: 04/08/15  Intake/Output   Yesterday:  08/08 0701 - 08/09 0700 In: 807 [P.O.:807] Out: 805 [Urine:700; Drains:105] This shift:     Bowel function:  Flatus: y  BM: y  Drain: min serosanguinous  Physical Exam:  General: Pt awake/alert/oriented x4 in no acute distress Eyes: PERRL, normal EOM.  Sclera clear.  No icterus Neuro: CN II-XII intact w/o focal sensory/motor deficits. Lymph: No head/neck/groin lymphadenopathy Psych:  No delerium/psychosis/paranoia HENT: Normocephalic, Mucus membranes moist.  No thrush Neck: Supple, No tracheal deviation Chest: No chest wall pain w good excursion.  Mild conv. wheezing/dyspnea - baseline CV:  Pulses intact.  Regular rhythm MS: Normal AROM mjr joints.  No obvious deformity Abdomen: Soft.  Nondistended.  Mildly tender at incisions only.  LUQ most sensitive.  Incision c/d/i.   No evidence of peritonitis.  No incarcerated hernias. Ext:  SCDs BLE.  No mjr edema.  No cyanosis Skin: No petechiae / purpura  Results:   Labs: Results for orders placed or performed during the hospital encounter of 04/04/15 (from the past 48 hour(s))  CBC     Status: Abnormal   Collection Time: 04/08/15  3:41 AM  Result Value Ref Range   WBC 5.1 4.0 - 10.5 K/uL   RBC 3.22 (L) 4.22 - 5.81 MIL/uL   Hemoglobin 10.9 (L) 13.0 - 17.0 g/dL   HCT 31.0 (L) 39.0 - 52.0 %   MCV 96.3 78.0 - 100.0 fL  MCH 33.9 26.0 - 34.0 pg   MCHC 35.2 30.0 - 36.0 g/dL   RDW 13.8 11.5 - 15.5 %   Platelets 114 (L) 150 - 400 K/uL    Comment: CONSISTENT WITH PREVIOUS RESULT  Basic metabolic panel     Status: Abnormal   Collection Time: 04/08/15  3:41 AM  Result Value Ref Range   Sodium 132 (L) 135 - 145 mmol/L    Comment: REPEATED TO VERIFY   Potassium 4.1 3.5 - 5.1 mmol/L   Chloride 106 101 - 111 mmol/L    Comment: REPEATED TO VERIFY   CO2 24 22 - 32 mmol/L    Comment: REPEATED TO VERIFY   Glucose, Bld 113 (H) 65 - 99 mg/dL    BUN 14 6 - 20 mg/dL   Creatinine, Ser 0.77 0.61 - 1.24 mg/dL   Calcium 7.7 (L) 8.9 - 10.3 mg/dL   GFR calc non Af Amer >60 >60 mL/min   GFR calc Af Amer >60 >60 mL/min    Comment: (NOTE) The eGFR has been calculated using the CKD EPI equation. This calculation has not been validated in all clinical situations. eGFR's persistently <60 mL/min signify possible Chronic Kidney Disease.    Anion gap 2 (L) 5 - 15    Imaging / Studies: No results found.  Medications / Allergies: per chart  Antibiotics: Anti-infectives    Start     Dose/Rate Route Frequency Ordered Stop   04/04/15 2000  ceFAZolin (ANCEF) IVPB 2 g/50 mL premix     2 g 100 mL/hr over 30 Minutes Intravenous 3 times per day 04/04/15 1514 04/04/15 2139   04/04/15 0951  polymyxin B 500,000 Units, bacitracin 50,000 Units in sodium chloride irrigation 0.9 % 500 mL irrigation  Status:  Discontinued       As needed 04/04/15 0951 04/04/15 1225   04/04/15 0625  ceFAZolin (ANCEF) IVPB 2 g/50 mL premix     2 g 100 mL/hr over 30 Minutes Intravenous On call to O.R. 04/04/15 0625 04/04/15 1145        Note: Portions of this report may have been transcribed using voice recognition software. Every effort was made to ensure accuracy; however, inadvertent computerized transcription errors may be present.   Any transcriptional errors that result from this process are unintentional.     Adin Hector, M.D., F.A.C.S. Gastrointestinal and Minimally Invasive Surgery [facility] [address], [place], [place] [address] [phone] Main / Paging   04/09/2015  CARE TEAM:  PCP: [name], MD  Outpatient Care Team: Patient Care Team: [name], MD as PCP - General (Internal Medicine) Nicanor Alcon, MD (Thoracic Surgery) Curt Bears, MD as Consulting Physician (Oncology) Dorothy Spark, MD as Consulting Physician (Cardiology) Greer Pickerel, MD as Consulting Physician (General  Surgery)  Inpatient Treatment Team: Treatment Team: Attending Provider: Gamal Boston, MD; Registered Nurse: Lynnell Dike, RN; Registered Nurse: Danie Chandler, RN; Technician: Larene Beach, NT; Technician: Abbe Amsterdam, NT; Technician: Coralie Carpen, NT; Technician: Sueanne Margarita, NT; Occupational Therapist: Philippa [name], OT

## 2015-04-09 NOTE — Addendum Note (Signed)
Addendum  created 04/09/15 1125 by Franne Grip, MD   Modules edited: Anesthesia Attestations

## 2015-04-10 MED ORDER — OXYCODONE HCL 5 MG PO TABS: 5.0000 mg | ORAL_TABLET | Freq: Four times a day (QID) | ORAL | Status: DC | PRN

## 2015-04-10 NOTE — Progress Notes (Signed)
SATURATION QUALIFICATIONS: (This note is used to comply with regulatory documentation for home oxygen)  Patient Saturations on Room Air at Rest = 82%  Patient Saturations on Room Air while Ambulating = 79%  Patient Saturations on 3 Liters of oxygen while Ambulating = 87%  Please briefly explain why patient needs home oxygen:

## 2015-04-10 NOTE — Progress Notes (Signed)
Ambulated patient with [id number] down half of hallway and back to room.

## 2015-04-10 NOTE — Discharge Summary (Signed)
Physician Discharge Summary  Patient ID: Joseph [name] MRN: [id number] DOB/AGE: 72-Jan-1942 72 y.o.  Admit date: 04/04/2015 Discharge date: 04/10/2015  Patient Care Team: [name], MD as PCP - General (Internal Medicine) Nicanor Alcon, MD (Thoracic Surgery) Curt Bears, MD as Consulting Physician (Oncology) Dorothy Spark, MD as Consulting Physician (Cardiology) Greer Pickerel, MD as Consulting Physician (General Surgery)  Admission Diagnoses: Principal Problem:   Recurrent ventral incisional hernia s/p open complex repair w mesh 04/04/2015 Active Problems:   Cancer of right lung   Hepatic cirrhosis   AAA- s/p Ao-Iliac BPG March 2015   COPD (chronic obstructive pulmonary disease)   Hypothyroidism   Hypoxemia requiring supplemental oxygen   Discharge Diagnoses:  Principal Problem:   Recurrent ventral incisional hernia s/p open complex repair w mesh 04/04/2015 Active Problems:   Cancer of right lung   Hepatic cirrhosis   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  Postoperatively, the patient gradually mobilized and advanced to a solid diet.  Pain and other symptoms were treated aggressively.  Watch in SDU for COPD - transferred to floor a few days later.  By the time of discharge, the patient was walking well the hallways, eating food, having flatus.  BM after MiralaxPain was well-controlled on an oral medications.  Based on meeting discharge criteria and continuing to recover, I felt it was safe for the patient to be discharged from the hospital  to further recover with close followup. Will set up home oxygen (needs after every surgery & then weans off in a few weeks post-op)Postoperative recommendations were discussed in detail.  They are written as well.   Significant Diagnostic Studies:  Results for orders placed or performed during the hospital encounter of 04/04/15 (from the past 72 hour(s))  CBC     Status: Abnormal   Collection Time: 04/08/15  3:41 AM  Result Value Ref Range   WBC 5.1 4.0 - 10.5 K/uL   RBC 3.22 (L) 4.22 - 5.81 MIL/uL   Hemoglobin 10.9 (L) 13.0 - 17.0 g/dL   HCT 31.0 (L) 39.0 - 52.0 %   MCV 96.3 78.0 - 100.0 fL   MCH 33.9 26.0 - 34.0 pg   MCHC 35.2 30.0 - 36.0 g/dL   RDW 13.8 11.5 - 15.5 %   Platelets 114 (L) 150 - 400 K/uL    Comment: CONSISTENT WITH PREVIOUS RESULT  Basic metabolic panel     Status: Abnormal   Collection Time: 04/08/15  3:41 AM  Result Value Ref Range   Sodium 132 (L) 135 - 145 mmol/L    Comment: REPEATED TO VERIFY   Potassium 4.1 3.5 - 5.1 mmol/L   Chloride 106 101 - 111 mmol/L    Comment: REPEATED TO VERIFY   CO2 24 22 - 32 mmol/L    Comment: REPEATED TO VERIFY   Glucose, Bld 113 (H) 65 - 99 mg/dL   BUN 14 6 - 20 mg/dL   Creatinine, Ser 0.77 0.61 - 1.24 mg/dL   Calcium 7.7 (L) 8.9 - 10.3 mg/dL   GFR calc non Af Amer >60 >60 mL/min  GFR calc Af Amer >60 >60 mL/min    Comment: (NOTE) The eGFR has been calculated using the CKD EPI equation. This calculation has not been validated in all clinical situations. eGFR's persistently <60 mL/min signify possible Chronic Kidney Disease.    Anion gap 2 (L) 5 - 15    No results found.  Discharge Exam: Blood pressure 110/61, pulse 92, temperature 97.6 F (36.4 C), temperature source Oral, resp. rate 18, height 6' (1.829 m), weight 101.8 kg (224 lb 6.9 oz), SpO2 94 %.  General: Pt awake/alert/oriented x4 in no major acute distress Eyes: PERRL, normal EOM. Sclera nonicteric Neuro: CN II-XII intact w/o focal sensory/motor  deficits. Lymph: No head/neck/groin lymphadenopathy Psych:  No delerium/psychosis/paranoia HENT: Normocephalic, Mucus membranes moist.  No thrush Neck: Supple, No tracheal deviation Chest: No pain.  Good respiratory excursion. CV:  Pulses intact.  Regular rhythm MS: Normal AROM mjr joints.  No obvious deformity Abdomen: Soft, Nondistended.  Min tender.  No incarcerated hernias.  Drains w serosanguionous output ~39m/day Ext:  SCDs BLE.  No significant edema.  No cyanosis Skin: No petechiae / purpura  Discharged Condition: good   Past Medical History  Diagnosis Date  . History of colon polyps 2006,2008  . Hyperlipidemia     takes Pravastatin  . Emphysema   . Enlarged prostate   . Arthritis     left knee and back arthrtis  . Hypothyroidism     takes Synthroid daily  . Cataract   . Hx of radiation therapy 09/02/11 to 10/19/11    chest  . Diverticulosis   . Cirrhosis     By radiography, seen on CT  . Rosacea   . CAD (coronary artery disease)   . Heart murmur     teen  . Acute on chronic diastolic CHF (congestive heart failure), NYHA class 3 07/23/2014  . Lung cancer     right upper lobe  . Shortness of breath     with exertion  . History of transfusion 2015  . Status post chemotherapy     last chemo 2013  . Atrial fibrillation with rapid ventricular response 07/04/2014  . Hx of radiation therapy   . Personal history of adenomatous colonic polyps 07/04/2012    2006, adenomatous right colon polyp removed, then ablated in 2007 followup, 2008, adenomatous polyp of the right colon removed with hot biopsy forceps. All by Dr. OLajoyce Corners5/01/2013 diminutive polyp ascending colon - adenoma    . Small bowel obstruction 07/05/2014  . Umbilical hernia, incarcerated- surgery 07/10/14 07/05/2014    Past Surgical History  Procedure Laterality Date  . Hernia repair  70's  . Vasectomy  70's  . Finger surgery  2008    left pointer finger  . Knee arthroscopy  2008    left knee  . Tonsillectomy       as a child  . Colonoscopy w/ biopsies      multiple   . Cardiac catheterization  2003  . Esophagogastroduodenoscopy  2006  . Portacath placement  08/10/2011    Procedure: INSERTION PORT-A-CATH;  Surgeon: DPierre Bali MD;  [place] [facility]  Service: Thoracic;  Laterality: Left;  Left Subclavian  . Abdominal aortic aneurysm repair N/A 10/31/2013    Procedure: aorto to left external iliac and right external iliac and left internal iliac, reimplantation of inferior mesenteric artery and ligation of left iliac artery aneursym;  Surgeon: CElam Dutch MD;  [facility]  Service: Vascular;  Laterality: N/A;  .  Thrombectomy iliac artery Bilateral 10/31/2013    Procedure: THROMBECTOMY ILIAC ARTERY;  Surgeon: Elam Dutch, MD;  Location: [place];  Service: Vascular;  Laterality: Bilateral;  . Abdominal wound dehiscence N/A 11/10/2013    Procedure: ABDOMINAL WOUND CLOSURE ;  Surgeon: Serafina Mitchell, MD;  Location: [place];  Service: Vascular;  Laterality: N/A;  . Laparotomy N/A 07/10/2014    Procedure: EXPLORATORY LAPAROTOMY;  Surgeon: [name], MD;  Location: [place];  Service: General;  Laterality: N/A;  . Lysis of adhesion N/A 07/10/2014    Procedure: LYSIS OF ADHESION - 90 minutes;  Surgeon: [name], MD;  Location: [place];  Service: General;  Laterality: N/A;  . Bowel resection N/A 07/10/2014    Procedure: SMALL BOWEL RESECTION;  Surgeon: [name], MD;  Location: [place];  Service: General;  Laterality: N/A;  . Incisional hernia repair N/A 07/10/2014    Procedure: HERNIA REPAIR INCISIONAL;  Surgeon: [name], MD;  Location: [place];  Service: General;  Laterality: N/A;  . Abdominal aortagram  10/20/2013    Procedure: ABDOMINAL Maxcine Ham;  Surgeon: Elam Dutch, MD;  Location: [facility] CATH LAB;  Service: Cardiovascular;;  . Lower extremity angiogram Bilateral 10/20/2013    Procedure: LOWER EXTREMITY ANGIOGRAM;  Surgeon: Elam Dutch, MD;  Location: [facility] CATH LAB;  Service:  Cardiovascular;  Laterality: Bilateral;  . Ventral hernia repair N/A 04/04/2015    Procedure: OPEN REPAIR OF RECURRENT ABDOMINAL WALL HERNIA WITH MESH, COMPLEX LYSIS OF ADHESIONS X1 HR TAR COMPONENT SEPARATION;  Surgeon: Greer Pickerel, MD;  Location: [facility];  Service: General;  Laterality: N/A;    Social History   Social History  . Marital Status: Married    Spouse Name: N/A  . Number of Children: N/A  . Years of Education: N/A   Occupational History  . Not on file.   Social History Main Topics  . Smoking status: Former Smoker -- 1.50 packs/day for 40 years    Types: Cigarettes    Quit date: 07/02/2011  . Smokeless tobacco: Former Systems developer  . Alcohol Use: 0.0 oz/week    0-2 Cans of beer per week     Comment: social  . Drug Use: No  . Sexual Activity: Not on file   Other Topics Concern  . Not on file   Social History Narrative    Family History  Problem Relation Age of Onset  . Anesthesia problems Neg Hx   . Hypotension Neg Hx   . Malignant hyperthermia Neg Hx   . Pseudochol deficiency Neg Hx   . Cancer Father   . Diabetes Father   . Hyperlipidemia Brother   . AAA (abdominal aortic aneurysm) Brother   . Hypertension Brother   . Hyperlipidemia Mother   . Hypertension Mother   . Hyperlipidemia Sister   . Hypertension Sister     Current Facility-Administered Medications  Medication Dose Route Frequency Provider Last Rate Last Dose  . 0.9 %  sodium chloride infusion  250 mL Intravenous PRN Kable Boston, MD      . acetaminophen (TYLENOL) suppository 650 mg  650 mg Rectal Q6H PRN Dontrelle Boston, MD      . acetaminophen (TYLENOL) tablet 325-650 mg  325-650 mg Oral Q6H PRN Aundrea Boston, MD      . albuterol (PROVENTIL) (2.5 MG/3ML) 0.083% nebulizer solution 2.5 mg  2.5 mg Nebulization Q4H PRN Kara Mead V, MD   2.5 mg at 04/09/15 1157  . alum & mag hydroxide-simeth (  MAALOX/MYLANTA) 200-200-20 MG/5ML suspension 30 mL  30 mL Oral Q6H PRN Zymeir Boston, MD      . antiseptic oral  rinse (CPC / CETYLPYRIDINIUM CHLORIDE 0.05%) solution 7 mL  7 mL Mouth Rinse BID Greer Pickerel, MD   7 mL at 04/09/15 2200  . bisacodyl (DULCOLAX) suppository 10 mg  10 mg Rectal Daily Brahm Boston, MD   Stopped at 04/08/15 0800  . feeding supplement (ENSURE ENLIVE) (ENSURE ENLIVE) liquid 237 mL  237 mL Oral TID WC Bernice Boston, MD   237 mL at 04/09/15 1325  . furosemide (LASIX) tablet 20 mg  20 mg Oral BID Kameran Boston, MD   20 mg at 04/09/15 1842  . guaiFENesin-dextromethorphan (ROBITUSSIN DM) 100-10 MG/5ML syrup 5 mL  5 mL Oral Q4H PRN Greer Pickerel, MD      . heparin injection 5,000 Units  5,000 Units Subcutaneous 3 times per day Greer Pickerel, MD   5,000 Units at 04/10/15 0540  . levothyroxine (SYNTHROID, LEVOTHROID) tablet 175 mcg  175 mcg Oral q morning - 10a Greer Pickerel, MD   175 mcg at 04/09/15 1000  . lip balm (CARMEX) ointment 1 application  1 application Topical BID Armany Boston, MD   1 application at 26/37/85 2200  . magic mouthwash  15 mL Oral QID PRN Loyde Boston, MD      . menthol-cetylpyridinium (CEPACOL) lozenge 3 mg  1 lozenge Oral PRN Raykwon Boston, MD      . methocarbamol (ROBAXIN) tablet 1,000 mg  1,000 mg Oral Q6H PRN Carolos Boston, MD      . metoprolol tartrate (LOPRESSOR) tablet 25 mg  25 mg Oral BID Greer Pickerel, MD   25 mg at 04/09/15 2146  . morphine 2 MG/ML injection 2-4 mg  2-4 mg Intravenous Q1H PRN Brenda Boston, MD      . multivitamin with minerals tablet 1 tablet  1 tablet Oral q morning - 10a Kenzo Boston, MD   1 tablet at 04/09/15 1000  . naproxen sodium (ANAPROX) tablet 550 mg  550 mg Oral BID WC Elizar Boston, MD   550 mg at 04/09/15 1840  . oxyCODONE (Oxy IR/ROXICODONE) immediate release tablet 5-10 mg  5-10 mg Oral Q4H PRN Cuahutemoc Boston, MD   5 mg at 04/10/15 0417  . phenol (CHLORASEPTIC) mouth spray 2 spray  2 spray Mouth/Throat PRN Rodgerick Boston, MD      . polyethylene glycol (MIRALAX / GLYCOLAX) packet 17 g  17 g Oral BID Dearies Boston, MD   17 g at 04/08/15 2211  .  polyvinyl alcohol (LIQUIFILM TEARS) 1.4 % ophthalmic solution 1 drop  1 drop Both Eyes PRN Armandina Gemma, MD   1 drop at 04/06/15 1643  . promethazine (PHENERGAN) injection 6.25-12.5 mg  6.25-12.5 mg Intravenous Q4H PRN Dak Boston, MD      . saccharomyces boulardii (FLORASTOR) capsule 250 mg  250 mg Oral BID Terrick Boston, MD   250 mg at 04/09/15 2146  . sodium chloride 0.9 % injection 3 mL  3 mL Intravenous Q12H Berlie Boston, MD   3 mL at 04/09/15 1000  . sodium chloride 0.9 % injection 3 mL  3 mL Intravenous PRN Ananias Boston, MD      . tiotropium [facility]) inhalation capsule 18 mcg  18 mcg Inhalation q morning - 10a Greer Pickerel, MD   18 mcg at 04/09/15 0900  . zolpidem (AMBIEN) tablet 5 mg  5 mg Oral QHS PRN Greer Pickerel, MD  No Known Allergies  Disposition: 01-Home or Self Care  Discharge Instructions    Call MD for:  extreme fatigue    Complete by:  As directed      Call MD for:  extreme fatigue    Complete by:  As directed      Call MD for:  hives    Complete by:  As directed      Call MD for:  hives    Complete by:  As directed      Call MD for:  persistant nausea and vomiting    Complete by:  As directed      Call MD for:  persistant nausea and vomiting    Complete by:  As directed      Call MD for:  redness, tenderness, or signs of infection (pain, swelling, redness, odor or green/yellow discharge around incision site)    Complete by:  As directed      Call MD for:  redness, tenderness, or signs of infection (pain, swelling, redness, odor or green/yellow discharge around incision site)    Complete by:  As directed      Call MD for:  severe uncontrolled pain    Complete by:  As directed      Call MD for:  severe uncontrolled pain    Complete by:  As directed      Call MD for:    Complete by:  As directed   Temperature > 101.59F     Call MD for:    Complete by:  As directed   Temperature > 101.59F     Diet - low sodium heart healthy    Complete by:  As directed       Discharge instructions    Complete by:  As directed   Please see discharge instruction sheets.  Also refer to handout given an office.  Please call our office if you have any questions or concerns [phone]     Discharge instructions    Complete by:  As directed   Please see discharge instruction sheets.  Also refer to handout given an office.  Please call our office if you have any questions or concerns [phone]     Discharge wound care:    Complete by:  As directed   If you have closed incisions, shower and bathe over these incisions with soap and water every day.  Remove all surgical dressings on postoperative day #3.  You do not need to replace dressings over the closed incisions unless you feel more comfortable with a Band-Aid covering it.   If you have an open wound that requires packing, please see wound care instructions.  In general, remove all dressings, wash wound with soap and water and then replace with saline moistened gauze.  Do the dressing change at least every day.  Please call our office [phone] if you have further questions.     Discharge wound care:    Complete by:  As directed   If you have closed incisions, shower and bathe over these incisions with soap and water every day.  Remove all surgical dressings on postoperative day #3.  You do not need to replace dressings over the closed incisions unless you feel more comfortable with a Band-Aid covering it.   If you have an open wound that requires packing, please see wound care instructions.  In general, remove all dressings, wash wound with soap and water and then replace with saline moistened gauze.  Do the dressing change at least every day.  Please call our office [phone] if you have further questions.     Driving Restrictions    Complete by:  As directed   No driving until off narcotics and can safely swerve away without pain during an emergency     Driving Restrictions    Complete by:  As directed    No driving until off narcotics and can safely swerve away without pain during an emergency     Increase activity slowly    Complete by:  As directed   Walk an hour a day.  Use 20-30 minute walks.  When you can walk 30 minutes without difficulty, it is fine to restart low impact/moderate activities such as biking, jogging, swimming, sexual activity, etc.  Eventually you can increase to unrestricted activity when not feeling pain.  If you feel pain: STOP!Marland Kitchen   Let pain protect you from overdoing it.  Use ice/heat & over-the-counter pain medications to help minimize soreness.  If that is not enough, then use your narcotic pain prescription as needed to remain active.  It is better to take extra pain medications and be more active than to stay bedridden to avoid all pain medications.     Increase activity slowly    Complete by:  As directed   Walk an hour a day.  Use 20-30 minute walks.  When you can walk 30 minutes without difficulty, it is fine to restart low impact/moderate activities such as biking, jogging, swimming, sexual activity, etc.  Eventually you can increase to unrestricted activity when not feeling pain.  If you feel pain: STOP!Marland Kitchen   Let pain protect you from overdoing it.  Use ice/heat & over-the-counter pain medications to help minimize soreness.  If that is not enough, then use your narcotic pain prescription as needed to remain active.  It is better to take extra pain medications and be more active than to stay bedridden to avoid all pain medications.     Lifting restrictions    Complete by:  As directed   Avoid heavy lifting initially.  Do not push through pain.  You have no specific weight limit - if it hurts to do, DON'T DO IT.   If you feel no pain, you are not injuring anything.  Pain will protect you from injury.  Coughing and sneezing are far more stressful to your incision than any lifting.  Avoid resuming heavy lifting / intense activity until off all narcotic pain medications.  When  ready to exercise more, give yourself 2 weeks to gradually get back to full intense exercise/activity.     Lifting restrictions    Complete by:  As directed   Avoid heavy lifting initially.  Do not push through pain.  You have no specific weight limit - if it hurts to do, DON'T DO IT.   If you feel no pain, you are not injuring anything.  Pain will protect you from injury.  Coughing and sneezing are far more stressful to your incision than any lifting.  Avoid resuming heavy lifting / intense activity until off all narcotic pain medications.  When ready to exercise more, give yourself 2 weeks to gradually get back to full intense exercise/activity.     May shower / Bathe    Complete by:  As directed      May shower / Bathe    Complete by:  As directed      May walk up steps  Complete by:  As directed      May walk up steps    Complete by:  As directed      Sexual Activity Restrictions    Complete by:  As directed   Sexual activity as tolerated.  Do not push through pain.  Pain will protect you from injury.     Sexual Activity Restrictions    Complete by:  As directed   Sexual activity as tolerated.  Do not push through pain.  Pain will protect you from injury.     Walk with assistance    Complete by:  As directed   Walk over an hour a day.  May use a walker/cane/companion to help with balance and stamina.     Walk with assistance    Complete by:  As directed   Walk over an hour a day.  May use a walker/cane/companion to help with balance and stamina.            Medication List    TAKE these medications        CLA PO  Take 2 tablets by mouth 2 (two) times daily.     DIGESTIVE HEALTH PROBIOTIC Caps  Take 1 capsule by mouth daily. "30 million cultures"     furosemide 20 MG tablet  Commonly known as:  LASIX  TAKE 1 TABLET BY MOUTH ONCE DAILY     lidocaine-prilocaine cream  Commonly known as:  EMLA  Apply 1 application topically as needed.     LIVER PO  Take 1 tablet by  mouth daily.     metoprolol tartrate 25 MG tablet  Commonly known as:  LOPRESSOR  Take 1 tablet (25 mg total) by mouth 2 (two) times daily.     metroNIDAZOLE 0.75 % gel  Commonly known as:  METROGEL  Apply 1 application topically 2 (two) times daily as needed (flare up.).     Milk Thistle 200 MG Caps  Take 1 capsule by mouth every morning.     multivitamins ther. w/minerals Tabs tablet  Take 1 tablet by mouth every morning.     naproxen sodium 220 MG tablet  Commonly known as:  ANAPROX  Take 440 mg by mouth 2 (two) times daily as needed (pain.).     nystatin-triamcinolone cream  Commonly known as:  MYCOLOG II  Apply 1 application topically daily as needed (rash/flare up.).     OVER THE COUNTER MEDICATION  Take 2 tablets by mouth 2 (two) times daily. LUNG SUPPORT     oxyCODONE 5 MG immediate release tablet  Commonly known as:  Oxy IR/ROXICODONE  Take 1-2 tablets (5-10 mg total) by mouth every 6 (six) hours as needed for moderate pain, severe pain or breakthrough pain.     oxyCODONE-acetaminophen 5-325 MG per tablet  Commonly known as:  PERCOCET/ROXICET  Take 1-2 tablets by mouth every 4 (four) hours as needed for moderate pain or severe pain.     PROSTATE PO  Take 1 tablet by mouth 2 (two) times daily.     Red Yeast Rice 600 MG Caps  Take 1 capsule (600 mg total) by mouth daily.     SYNTHROID 175 MCG tablet  Generic drug:  levothyroxine  Take 175 mcg by mouth every morning.     tiotropium 18 MCG inhalation capsule  Commonly known as:  SPIRIVA  Place 18 mcg into inhaler and inhale every morning.           Follow-up Information  Follow up with [name], MD. Schedule an appointment as soon as possible for a visit in 1 week.   Specialty:  General Surgery   Why:  To have your drain removed & incisions re-checked   Contact information:   [address] [place] [address] [phone]       Follow up with [name], MD. Schedule an  appointment as soon as possible for a visit in 3 weeks.   Specialty:  General Surgery   Why:  To follow up after your operation, To follow up after your hospital stay   Contact information:   [place] [address] [phone]        Signed: Morton Peters, M.D., F.A.C.S. Gastrointestinal and Minimally Invasive Surgery [facility] [address], [place], [place] [address] [phone] Main / Paging   04/10/2015, 7:39 AM

## 2015-04-10 NOTE — Care Management Note (Signed)
Case Management Note  Patient Details  Name: Joseph [name] MRN: [id number] Date of Birth: 1941/03/31  Subjective/Objective:                   Recurrent ventral incisional hernia, intraabdominal adhesions. Action/Plan: Discharge planning  Expected Discharge Date:                  Expected Discharge Plan:  [place]  In-House Referral:  NA  Discharge planning Services  CM Consult  Post Acute Care Choice:  NA Choice offered to:  NA  DME Arranged:  Oxygen DME Agency:  [place] Arranged:  PT, NA [place] Agency:  [facility]  Status of Service:  Completed, signed off  Medicare Important Message Given:  Yes-second notification given Date Medicare IM Given:    Medicare IM give by:    Date Additional Medicare IM Given:    Additional Medicare Important Message give by:     If discussed at [place] of Stay Meetings, dates discussed:    Additional Comments: CM received notification from [facility] pt refusing RN but will accept HHPT/Aide.  Pt has add on home oxygen. Request for pulmonary and order made and placed.  [facility] DME rep, Pura Spice to deliver the oxygen tank to room prior to discharge.  No other CM needs were communicated. Dellie Catholic, RN 04/10/2015, 2:17 PM

## 2015-04-23 ENCOUNTER — Encounter (HOSPITAL_COMMUNITY): Payer: Self-pay | Admitting: General Surgery

## 2015-05-14 ENCOUNTER — Ambulatory Visit (HOSPITAL_BASED_OUTPATIENT_CLINIC_OR_DEPARTMENT_OTHER): Payer: Medicare Other

## 2015-05-14 VITALS — BP 113/76 | HR 82 | Temp 98.1°F | Resp 16

## 2015-05-14 DIAGNOSIS — Z95828 Presence of other vascular implants and grafts: Secondary | ICD-10-CM

## 2015-05-14 DIAGNOSIS — Z452 Encounter for adjustment and management of vascular access device: Secondary | ICD-10-CM | POA: Diagnosis not present

## 2015-05-14 DIAGNOSIS — Z85118 Personal history of other malignant neoplasm of bronchus and lung: Secondary | ICD-10-CM

## 2015-05-14 MED ORDER — SODIUM CHLORIDE 0.9 % IJ SOLN
10.0000 mL | INTRAMUSCULAR | Status: DC | PRN
  Administered 2015-05-14: 10 mL via INTRAVENOUS
  Filled 2015-05-14: qty 10

## 2015-05-14 MED ORDER — HEPARIN SOD (PORK) LOCK FLUSH 100 UNIT/ML IV SOLN
500.0000 [IU] | Freq: Once | INTRAVENOUS | Status: AC
  Administered 2015-05-14: 500 [IU] via INTRAVENOUS
  Filled 2015-05-14: qty 5

## 2015-05-14 NOTE — Patient Instructions (Signed)

## 2015-05-30 ENCOUNTER — Encounter: Payer: Self-pay | Admitting: Physical Therapy

## 2015-05-30 ENCOUNTER — Ambulatory Visit: Payer: Medicare Other | Attending: General Surgery | Admitting: Physical Therapy

## 2015-05-30 DIAGNOSIS — R1084 Generalized abdominal pain: Secondary | ICD-10-CM | POA: Insufficient documentation

## 2015-05-30 NOTE — Therapy (Addendum)
[facility] [address], [place], [place], [address] Phone: [phone]   Fax:  [phone]  Physical Therapy Evaluation  Patient Details  Name: Joseph [name] MRN: [id number] Date of Birth: 25-Nov-1940 Referring Provider:  Greer Pickerel, MD  Encounter Date: 05/30/2015      PT End of Session - 05/30/15 1306    Visit Number 1   Date for PT Re-Evaluation 07/25/15   PT Start Time 4825   PT Stop Time 1305   PT Time Calculation (min) 35 min   Activity Tolerance Patient tolerated treatment well   Behavior During Therapy [facility] for tasks assessed/performed      Past Medical History  Diagnosis Date  . History of colon polyps 2006,2008  . Hyperlipidemia     takes Pravastatin  . Emphysema   . Enlarged prostate   . Arthritis     left knee and back arthrtis  . Hypothyroidism     takes Synthroid daily  . Cataract   . Hx of radiation therapy 09/02/11 to 10/19/11    chest  . Diverticulosis   . Cirrhosis     By radiography, seen on CT  . Rosacea   . CAD (coronary artery disease)   . Heart murmur     teen  . Acute on chronic diastolic CHF (congestive heart failure), NYHA class 3 07/23/2014  . Lung cancer     right upper lobe  . Shortness of breath     with exertion  . History of transfusion 2015  . Status post chemotherapy     last chemo 2013  . Atrial fibrillation with rapid ventricular response 07/04/2014  . Hx of radiation therapy   . Personal history of adenomatous colonic polyps 07/04/2012    2006, adenomatous right colon polyp removed, then ablated in 2007 followup, 2008, adenomatous polyp of the right colon removed with hot biopsy forceps. All by Dr. Lajoyce Corners 01/03/2013 diminutive polyp ascending colon - adenoma    . Small bowel obstruction 07/05/2014  . Umbilical hernia, incarcerated- surgery 07/10/14 07/05/2014    Past Surgical History  Procedure Laterality Date  . Hernia repair  70's  . Vasectomy  70's  .  Finger surgery  2008    left pointer finger  . Knee arthroscopy  2008    left knee  . Tonsillectomy      as a child  . Colonoscopy w/ biopsies      multiple   . Cardiac catheterization  2003  . Esophagogastroduodenoscopy  2006  . Portacath placement  08/10/2011    Procedure: INSERTION PORT-A-CATH;  Surgeon: Pierre Bali, MD;  Location: [place];  Service: Thoracic;  Laterality: Left;  Left Subclavian  . Abdominal aortic aneurysm repair N/A 10/31/2013    Procedure: aorto to left external iliac and right external iliac and left internal iliac, reimplantation of inferior mesenteric artery and ligation of left iliac artery aneursym;  Surgeon: Elam Dutch, MD;  Location: [place];  Service: Vascular;  Laterality: N/A;  . Thrombectomy iliac artery Bilateral 10/31/2013    Procedure: THROMBECTOMY ILIAC ARTERY;  Surgeon: Elam Dutch, MD;  Location: [place];  Service: Vascular;  Laterality: Bilateral;  . Abdominal wound dehiscence N/A 11/10/2013    Procedure: ABDOMINAL WOUND CLOSURE ;  Surgeon: Serafina Mitchell, MD;  Location: [place];  Service: Vascular;  Laterality: N/A;  . Laparotomy N/A 07/10/2014    Procedure: EXPLORATORY LAPAROTOMY;  Surgeon: [name], MD;  Location: [place];  Service: General;  Laterality: N/A;  . Lysis of adhesion N/A 07/10/2014    Procedure: LYSIS OF ADHESION - 90 minutes;  Surgeon: [name], MD;  Location: [place];  Service: General;  Laterality: N/A;  . Bowel resection N/A 07/10/2014    Procedure: SMALL BOWEL RESECTION;  Surgeon: [name], MD;  Location: [place];  Service: General;  Laterality: N/A;  . Incisional hernia repair N/A 07/10/2014    Procedure: HERNIA REPAIR INCISIONAL;  Surgeon: [name], MD;  Location: [place];  Service: General;  Laterality: N/A;  . Abdominal aortagram  10/20/2013    Procedure: ABDOMINAL Maxcine Ham;  Surgeon: Elam Dutch, MD;  Location: [facility] CATH LAB;  Service: Cardiovascular;;  . Lower extremity angiogram Bilateral 10/20/2013     Procedure: LOWER EXTREMITY ANGIOGRAM;  Surgeon: Elam Dutch, MD;  Location: [facility] CATH LAB;  Service: Cardiovascular;  Laterality: Bilateral;  . Ventral hernia repair N/A 04/04/2015    Procedure: OPEN REPAIR OF RECURRENT ABDOMINAL WALL HERNIA WITH MESH, COMPLEX LYSIS OF ADHESIONS X1 HR TAR COMPONENT SEPARATION;  Surgeon: Greer Pickerel, MD;  Location: [facility];  Service: General;  Laterality: N/A;    There were no vitals filed for this visit.  Visit Diagnosis:  Generalized abdominal pain - Plan: PT plan of care cert/re-cert      Subjective Assessment - 05/30/15 1235    Subjective Patient had a triple hernia and was fixed.  Patient had a colon blockage.  Patient had a hernia return.  On 04/04/2015 to repair abdominal hernia with mesh.  Patient was in the hospital for 8 days. Patient had 4 tubes for dainage.   Patient Stated Goals reduce discomfort   Currently in Pain? Yes   Pain Score 3    Pain Location Abdomen   Pain Orientation Mid   Pain Descriptors / Indicators Sharp  needle pain   Pain Type Acute pain   Pain Onset More than a month ago   Pain Frequency Constant   Aggravating Factors  touching, daily activities, not able to do cardio yet   Pain Relieving Factors rest            [facility] PT Assessment - 05/30/15 0001    Assessment   Medical Diagnosis Z98.89 status post hernia repair; M79.1 Myofascial pain   Onset Date/Surgical Date 04/04/15   Prior Therapy None   Precautions   Precautions Other (comment)  No ultrasound   Balance Screen   Has the patient fallen in the past 6 months No   Has the patient had a decrease in activity level because of a fear of falling?  No   Is the patient reluctant to leave their home because of a fear of falling?  No   Prior Function   Level of Independence Independent   Leisure exercise at gym   Cognition   Overall Cognitive Status Within Functional Limits for tasks assessed   Observation/Other Assessments   Focus on Therapeutic Outcomes (FOTO)   Therapist discretion 50% limitation due to not able to do strenous activity  goal is 25% limitation   ROM / Strength   AROM / PROM / Strength AROM;Strength   AROM   Lumbar Extension decreased by 25%   Lumbar - Right Side Bend full   Lumbar - Left Side Bend full   Strength   Overall Strength Comments bil. hip strength is 4/5   Palpation   Palpation comment Decreased mobility of abdominal scars, decreased mobility of diaphragm  G-Code therapist discretion: PT primary other 50% limitation due to limited activity.   Current status [id number] 50% limitation Ck and goal status is CJ 30% limitation. Earlie Counts, PT 06/06/2015 3:08 PM                        PT Education - 05/30/15 1305    Education provided Yes   Education Details education on scar massage along midline incision   Person(s) Educated Patient   Methods Explanation;Demonstration   Comprehension Verbalized understanding;Returned demonstration          PT Short Term Goals - 05/30/15 1311    PT SHORT TERM GOAL #1   Title independent with scar massage   Time 4   Period Weeks   Status New   PT SHORT TERM GOAL #2   Title pain with activity decreased >/= 25%   Time 4   Period Weeks   Status New           PT Long Term Goals - 05/30/15 1313    PT LONG TERM GOAL #1   Title pain with activity decreased >/= 75% due to increased scar mobility   Time 8   Period Weeks   Status New   PT LONG TERM GOAL #2   Title ability to return to gym performing cardio exercise with pain decreased >/= 75%   Time 8   Period Weeks   Status New   PT LONG TERM GOAL #3   Title understand correct body mechanics with daily activities to decrease strain on abdomen   Time 8   Period Weeks   Status New               Plan - 05/30/15 1306    Clinical Impression Statement Patient is a 74 year old male with diagnosis of status post hernia repair and myofascial repair on 04/04/2015.  Patient reports pain at level  3/10 along the abdomen that is constant with occasion of sharp pain.  Patient reports he has difficulty with performing daily tasks due to pain.  Decreased mobility of abdominal scars and diaphragm.  Patient has not been abl eto do his cardio exercise for his heart due to pain.  Patient  is limited by 50% due to not able to do full activities.  Patient would benefit from physical therapy to improve scar mobility, decrease pain and return to cardio exercise.    Pt will benefit from skilled therapeutic intervention in order to improve on the following deficits Pain;Decreased mobility;Decreased activity tolerance;Decreased endurance;Increased fascial restricitons   Rehab Potential Excellent   Clinical Impairments Affecting Rehab Potential None   PT Frequency 2x / week   PT Duration 8 weeks   PT Treatment/Interventions ADLs/Self Care Home Management;Therapeutic exercise;Therapeutic activities;Neuromuscular re-education;Patient/family education;Manual techniques;Scar mobilization   PT Next Visit Plan soft tissue work, cardio   PT Home Exercise Plan scar massage   Recommended Other Services None   Consulted and Agree with Plan of Care Patient         Problem List Patient Active Problem List   Diagnosis Date Noted  . 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 Hypoxemia requiring supplemental oxygen   . COPD (chronic obstructive pulmonary disease) 07/06/2014  . Hypothyroidism 07/06/2014  . Numbness-Left Thigh 12/14/2013  . AAA- s/p Ao-Iliac BPG March 2015 10/20/2013  . Hyperlipidemia 10/09/2013  . Hepatic cirrhosis 12/02/2012  . Cancer of right lung 08/06/2011  GRAY,CHERYL,PT 05/30/2015, 1:17 PM  [facility] [address] [place], [place], [address] Phone: [phone]   Fax:  [phone]

## 2015-06-03 ENCOUNTER — Ambulatory Visit: Payer: Medicare Other | Attending: General Surgery | Admitting: Physical Therapy

## 2015-06-03 DIAGNOSIS — R1084 Generalized abdominal pain: Secondary | ICD-10-CM | POA: Insufficient documentation

## 2015-06-03 NOTE — Therapy (Addendum)
[facility] [address], [place], [place], [address] Phone: [phone]   Fax:  [phone]  Physical Therapy Treatment  Patient Details  Name: Joseph [name] MRN: [id number] Date of Birth: 1940/09/17 Referring Provider:  Greer Pickerel, MD  Encounter Date: 06/03/2015      PT End of Session - 06/03/15 0926    Visit Number 2   Number of Visits 10  Medicare   Date for PT Re-Evaluation 07/25/15   PT Start Time 0845   PT Stop Time 0925   PT Time Calculation (min) 40 min   Activity Tolerance Patient tolerated treatment well   Behavior During Therapy [facility] for tasks assessed/performed      Past Medical History  Diagnosis Date  . History of colon polyps 2006,2008  . Hyperlipidemia     takes Pravastatin  . Emphysema   . Enlarged prostate   . Arthritis     left knee and back arthrtis  . Hypothyroidism     takes Synthroid daily  . Cataract   . Hx of radiation therapy 09/02/11 to 10/19/11    chest  . Diverticulosis   . Cirrhosis     By radiography, seen on CT  . Rosacea   . CAD (coronary artery disease)   . Heart murmur     teen  . Acute on chronic diastolic CHF (congestive heart failure), NYHA class 3 07/23/2014  . Lung cancer     right upper lobe  . Shortness of breath     with exertion  . History of transfusion 2015  . Status post chemotherapy     last chemo 2013  . Atrial fibrillation with rapid ventricular response 07/04/2014  . Hx of radiation therapy   . Personal history of adenomatous colonic polyps 07/04/2012    2006, adenomatous right colon polyp removed, then ablated in 2007 followup, 2008, adenomatous polyp of the right colon removed with hot biopsy forceps. All by Dr. Lajoyce Corners 01/03/2013 diminutive polyp ascending colon - adenoma    . Small bowel obstruction 07/05/2014  . Umbilical hernia, incarcerated- surgery 07/10/14 07/05/2014    Past Surgical History  Procedure Laterality Date  . Hernia repair   70's  . Vasectomy  70's  . Finger surgery  2008    left pointer finger  . Knee arthroscopy  2008    left knee  . Tonsillectomy      as a child  . Colonoscopy w/ biopsies      multiple   . Cardiac catheterization  2003  . Esophagogastroduodenoscopy  2006  . Portacath placement  08/10/2011    Procedure: INSERTION PORT-A-CATH;  Surgeon: Pierre Bali, MD;  Location: [place];  Service: Thoracic;  Laterality: Left;  Left Subclavian  . Abdominal aortic aneurysm repair N/A 10/31/2013    Procedure: aorto to left external iliac and right external iliac and left internal iliac, reimplantation of inferior mesenteric artery and ligation of left iliac artery aneursym;  Surgeon: Elam Dutch, MD;  Location: [place];  Service: Vascular;  Laterality: N/A;  . Thrombectomy iliac artery Bilateral 10/31/2013    Procedure: THROMBECTOMY ILIAC ARTERY;  Surgeon: Elam Dutch, MD;  Location: [place];  Service: Vascular;  Laterality: Bilateral;  . Abdominal wound dehiscence N/A 11/10/2013    Procedure: ABDOMINAL WOUND CLOSURE ;  Surgeon: Serafina Mitchell, MD;  Location: [place];  Service: Vascular;  Laterality: N/A;  . Laparotomy N/A 07/10/2014    Procedure: EXPLORATORY LAPAROTOMY;  Surgeon:  [name], MD;  Location: [place];  Service: General;  Laterality: N/A;  . Lysis of adhesion N/A 07/10/2014    Procedure: LYSIS OF ADHESION - 90 minutes;  Surgeon: [name], MD;  Location: [place];  Service: General;  Laterality: N/A;  . Bowel resection N/A 07/10/2014    Procedure: SMALL BOWEL RESECTION;  Surgeon: [name], MD;  Location: [place];  Service: General;  Laterality: N/A;  . Incisional hernia repair N/A 07/10/2014    Procedure: HERNIA REPAIR INCISIONAL;  Surgeon: [name], MD;  Location: [place];  Service: General;  Laterality: N/A;  . Abdominal aortagram  10/20/2013    Procedure: ABDOMINAL Maxcine Ham;  Surgeon: Elam Dutch, MD;  Location: [facility] CATH LAB;  Service: Cardiovascular;;  . Lower extremity  angiogram Bilateral 10/20/2013    Procedure: LOWER EXTREMITY ANGIOGRAM;  Surgeon: Elam Dutch, MD;  Location: [facility] CATH LAB;  Service: Cardiovascular;  Laterality: Bilateral;  . Ventral hernia repair N/A 04/04/2015    Procedure: OPEN REPAIR OF RECURRENT ABDOMINAL WALL HERNIA WITH MESH, COMPLEX LYSIS OF ADHESIONS X1 HR TAR COMPONENT SEPARATION;  Surgeon: Greer Pickerel, MD;  Location: [facility];  Service: General;  Laterality: N/A;    There were no vitals filed for this visit.  Visit Diagnosis:  Generalized abdominal pain      Subjective Assessment - 06/03/15 0851    Subjective I get a pain in the front of my stomach that will radiate to my back.    Patient Stated Goals reduce discomfort   Currently in Pain? Yes   Pain Score 7    Pain Location Abdomen   Pain Orientation Right   Pain Descriptors / Indicators Sharp   Pain Type Acute pain   Pain Onset More than a month ago   Pain Frequency Intermittent   Aggravating Factors  touching, daily activites, not able to do cardio yet   Pain Relieving Factors rest   Multiple Pain Sites No                         [facility] Adult PT Treatment/Exercise - 06/03/15 0001    Therapeutic Activites    Therapeutic Activities ADL's  sitting posture to be able to open up the diaphragm   Manual Therapy   Manual Therapy Soft tissue mobilization;Myofascial release   Soft tissue mobilization along abdominal massage, diaghragm soft tissue work   Myofascial Release scar massage     G-code is therapist discretion 50% limitation due to not able to do physical activity.  Functional limitation is other PT primary, Goal status is CJ and discharge status is CK.   Earlie Counts, PT 06/17/2015 12:57 PM            PT Education - 06/03/15 0925    Education provided Yes   Education Details reviewed scar massage, sitting posture   Methods Explanation;Verbal cues;Demonstration   Comprehension Verbalized understanding;Returned demonstration           PT Short Term Goals - 06/03/15 0926    PT SHORT TERM GOAL #1   Title independent with scar massage   Time 4   Status Achieved   PT SHORT TERM GOAL #2   Title pain with activity decreased >/= 25%   Time 4   Period Weeks   Status On-going           PT Long Term Goals - 05/30/15 1313    PT LONG TERM GOAL #1  Title pain with activity decreased >/= 75% due to increased scar mobility   Time 8   Period Weeks   Status New   PT LONG TERM GOAL #2   Title ability to return to gym performing cardio exercise with pain decreased >/= 75%   Time 8   Period Weeks   Status New   PT LONG TERM GOAL #3   Title understand correct body mechanics with daily activities to decrease strain on abdomen   Time 8   Period Weeks   Status New               Plan - 06/03/15 0926    Clinical Impression Statement Patient is a 74 year old male with diagnosis of status post hernia repair and myofascial repair on 04/04/2015.  Patient is able to perform scar massage correctly.  Patient is learning correct posture to be abel to open up the diapghram.  Patient will benefit from physcial therapy to improve soft tissue mobility.    Pt will benefit from skilled therapeutic intervention in order to improve on the following deficits Pain;Decreased mobility;Decreased activity tolerance;Decreased endurance;Increased fascial restricitons   Rehab Potential Excellent   Clinical Impairments Affecting Rehab Potential None   PT Frequency 2x / week   PT Duration 8 weeks   PT Treatment/Interventions ADLs/Self Care Home Management;Therapeutic exercise;Therapeutic activities;Neuromuscular re-education;Patient/family education;Manual techniques;Scar mobilization   PT Next Visit Plan soft tissue work, cardio   PT Home Exercise Plan posture   Consulted and Agree with Plan of Care Patient        Problem List Patient Active Problem List   Diagnosis Date Noted  . Recurrent ventral incisional hernia s/p open complex  repair w mesh 04/04/2015 04/04/2015  . Hypoxemia requiring supplemental oxygen   . COPD (chronic obstructive pulmonary disease) ([place]) 07/06/2014  . Hypothyroidism 07/06/2014  . Numbness-Left Thigh 12/14/2013  . AAA- s/p Ao-Iliac BPG March 2015 10/20/2013  . Hyperlipidemia 10/09/2013  . Hepatic cirrhosis ([place]) 12/02/2012  . Cancer of right lung ([place]) 08/06/2011    Joseph [name],PT 06/03/2015, 9:29 AM  [facility] [address], [place], [place], [address] Phone: [phone]   Fax:  [phone]  PHYSICAL THERAPY DISCHARGE SUMMARY  Visits from Start of Care: 2  Current functional level related to goals / functional outcomes: No goals met due to discharging himself because he had other medical issues.   Remaining deficits: Patient called to cancel on 06/17/2015 to cancel all of his appointments due to having other medical issues and be discharged.    Education / Equipment: HEP Plan: Patient agrees to discharge.  Patient goals were not met. Patient is being discharged due to the patient's request. Thank you for the referral. Earlie Counts, PT 06/17/2015 1:00 PM   ?????

## 2015-06-19 ENCOUNTER — Ambulatory Visit: Payer: Medicare Other | Admitting: Physical Therapy

## 2015-06-25 ENCOUNTER — Ambulatory Visit (HOSPITAL_BASED_OUTPATIENT_CLINIC_OR_DEPARTMENT_OTHER): Payer: Medicare Other

## 2015-06-25 DIAGNOSIS — Z85118 Personal history of other malignant neoplasm of bronchus and lung: Secondary | ICD-10-CM

## 2015-06-25 DIAGNOSIS — Z452 Encounter for adjustment and management of vascular access device: Secondary | ICD-10-CM | POA: Diagnosis not present

## 2015-06-25 DIAGNOSIS — Z95828 Presence of other vascular implants and grafts: Secondary | ICD-10-CM

## 2015-06-25 MED ORDER — HEPARIN SOD (PORK) LOCK FLUSH 100 UNIT/ML IV SOLN
500.0000 [IU] | Freq: Once | INTRAVENOUS | Status: AC
  Administered 2015-06-25: 500 [IU] via INTRAVENOUS
  Filled 2015-06-25: qty 5

## 2015-06-25 MED ORDER — SODIUM CHLORIDE 0.9 % IJ SOLN
10.0000 mL | INTRAMUSCULAR | Status: DC | PRN
  Administered 2015-06-25: 10 mL via INTRAVENOUS
  Filled 2015-06-25: qty 10

## 2015-06-25 NOTE — Patient Instructions (Signed)

## 2015-06-26 ENCOUNTER — Encounter: Payer: Medicare Other | Admitting: Physical Therapy

## 2015-07-03 ENCOUNTER — Encounter: Payer: Medicare Other | Admitting: Physical Therapy

## 2015-07-08 ENCOUNTER — Ambulatory Visit (HOSPITAL_COMMUNITY)
Admission: RE | Admit: 2015-07-08 | Discharge: 2015-07-08 | Disposition: A | Discharge status: Home / Self Care | Payer: Medicare Other | Source: Ambulatory Visit | Attending: Internal Medicine | Admitting: Internal Medicine

## 2015-07-08 ENCOUNTER — Ambulatory Visit (HOSPITAL_BASED_OUTPATIENT_CLINIC_OR_DEPARTMENT_OTHER): Payer: Medicare Other | Admitting: Lab

## 2015-07-08 DIAGNOSIS — J439 Emphysema, unspecified: Secondary | ICD-10-CM | POA: Diagnosis not present

## 2015-07-08 DIAGNOSIS — C342 Malignant neoplasm of middle lobe, bronchus or lung: Secondary | ICD-10-CM

## 2015-07-08 DIAGNOSIS — K7460 Unspecified cirrhosis of liver: Secondary | ICD-10-CM | POA: Diagnosis not present

## 2015-07-08 DIAGNOSIS — I7090 Unspecified atherosclerosis: Secondary | ICD-10-CM | POA: Diagnosis not present

## 2015-07-08 DIAGNOSIS — Z08 Encounter for follow-up examination after completed treatment for malignant neoplasm: Secondary | ICD-10-CM | POA: Insufficient documentation

## 2015-07-08 DIAGNOSIS — Z85118 Personal history of other malignant neoplasm of bronchus and lung: Secondary | ICD-10-CM | POA: Diagnosis not present

## 2015-07-08 DIAGNOSIS — R911 Solitary pulmonary nodule: Secondary | ICD-10-CM | POA: Diagnosis not present

## 2015-07-08 LAB — COMPREHENSIVE METABOLIC PANEL (CC13)
ALK PHOS: 153 U/L — AB (ref 40–150)
ALT: 33 U/L (ref 0–55)
ANION GAP: 8 meq/L (ref 3–11)
AST: 33 U/L (ref 5–34)
Albumin: 3.4 g/dL — ABNORMAL LOW (ref 3.5–5.0)
BILIRUBIN TOTAL: 1.54 mg/dL — AB (ref 0.20–1.20)
BUN: 17.2 mg/dL (ref 7.0–26.0)
CALCIUM: 9.2 mg/dL (ref 8.4–10.4)
CO2: 24 mEq/L (ref 22–29)
CREATININE: 0.9 mg/dL (ref 0.7–1.3)
Chloride: 111 mEq/L — ABNORMAL HIGH (ref 98–109)
EGFR: 80 mL/min/{1.73_m2} — ABNORMAL LOW (ref >90)
Glucose: 93 mg/dl (ref 70–140)
Potassium: 4 mEq/L (ref 3.5–5.1)
Sodium: 143 mEq/L (ref 136–145)
Total Protein: 6.9 g/dL (ref 6.4–8.3)

## 2015-07-08 LAB — CBC WITH DIFFERENTIAL/PLATELET
BASO%: 0.2 % (ref 0.0–2.0)
Basophils Absolute: 0 10*3/uL (ref 0.0–0.1)
EOS%: 1.5 % (ref 0.0–7.0)
Eosinophils Absolute: 0.1 10*3/uL (ref 0.0–0.5)
HCT: 44.6 % (ref 38.4–49.9)
HGB: 15.4 g/dL (ref 13.0–17.1)
LYMPH%: 32.8 % (ref 14.0–49.0)
MCH: 32.8 pg (ref 27.2–33.4)
MCHC: 34.5 g/dL (ref 32.0–36.0)
MCV: 95.1 fL (ref 79.3–98.0)
MONO#: 0.5 10*3/uL (ref 0.1–0.9)
MONO%: 9.5 % (ref 0.0–14.0)
NEUT#: 3 10*3/uL (ref 1.5–6.5)
NEUT%: 56 % (ref 39.0–75.0)
Platelets: 117 10*3/uL — ABNORMAL LOW (ref 140–400)
RBC: 4.69 10*6/uL (ref 4.20–5.82)
RDW: 13.8 % (ref 11.0–14.6)
WBC: 5.4 10*3/uL (ref 4.0–10.3)
lymph#: 1.8 10*3/uL (ref 0.9–3.3)

## 2015-07-08 MED ORDER — IOHEXOL 300 MG/ML  SOLN
75.0000 mL | Freq: Once | INTRAMUSCULAR | Status: AC | PRN
  Administered 2015-07-08: 75 mL via INTRAVENOUS

## 2015-07-11 ENCOUNTER — Ambulatory Visit (HOSPITAL_BASED_OUTPATIENT_CLINIC_OR_DEPARTMENT_OTHER): Payer: Medicare Other | Admitting: Internal Medicine

## 2015-07-11 ENCOUNTER — Encounter: Payer: Self-pay | Admitting: Internal Medicine

## 2015-07-11 ENCOUNTER — Telehealth: Payer: Self-pay | Admitting: Internal Medicine

## 2015-07-11 VITALS — BP 121/77 | HR 81 | Temp 98.3°F | Resp 17 | Ht 72.0 in | Wt 211.0 lb

## 2015-07-11 DIAGNOSIS — C3411 Malignant neoplasm of upper lobe, right bronchus or lung: Secondary | ICD-10-CM

## 2015-07-11 DIAGNOSIS — Z85118 Personal history of other malignant neoplasm of bronchus and lung: Secondary | ICD-10-CM | POA: Diagnosis not present

## 2015-07-11 NOTE — Progress Notes (Signed)
Joseph [name] [phone]   [phone]) [phone]  OFFICE PROGRESS NOTE  [name], MD [place] [address]  DIAGNOSIS: Stage IIIA/IIIB non-small cell lung cancer   PRIOR THERAPY:  1) Concurrent chemoradiation with chemotherapy in the form of weekly carboplatin for an AUC of 2 and paclitaxel at 45 mg/m2.  2) Consolidation chemotherapy with carboplatin for AUC of 5 and paclitaxel 175 mg/M2 every 3 weeks with Neulasta support, status post 3 cycles, last cycle was given on 01/05/2012 with partial response.   CURRENT THERAPY: Observation.  INTERVAL HISTORY: CUTTER PASSEY 74 y.o. male returns to the clinic today for followup visit accompanied by his wife. He has been observation since May 2013. The patient is feeling fine today with no specific complaints. He had the hernia surgery in August 2016 and recovering well from the surgery. He denied having any significant chest pain, shortness of breath, cough or hemoptysis. He denied having any significant weight loss or night sweats. The patient had repeat CT scan of the chest performed recently and he is here for evaluation and discussion of his scan results.  MEDICAL HISTORY: Past Medical History  Diagnosis Date  . History of colon polyps 2006,2008  . Hyperlipidemia     takes Pravastatin  . Emphysema   . Enlarged prostate   . Arthritis     left knee and back arthrtis  . Hypothyroidism     takes Synthroid daily  . Cataract   . Hx of radiation therapy 09/02/11 to 10/19/11    chest  . Diverticulosis   . Cirrhosis ([place])     By radiography, seen on CT  . Rosacea   . CAD (coronary artery disease)   . Heart murmur     teen  . Acute on chronic diastolic CHF (congestive heart failure), NYHA class 3 ([place]) 07/23/2014  . Lung cancer ([place])     right upper lobe  . Shortness of breath     with exertion  . History of transfusion 2015  . Status post chemotherapy     last chemo 2013  .  Atrial fibrillation with rapid ventricular response ([place]) 07/04/2014  . Hx of radiation therapy   . Personal history of adenomatous colonic polyps 07/04/2012    2006, adenomatous right colon polyp removed, then ablated in 2007 followup, 2008, adenomatous polyp of the right colon removed with hot biopsy forceps. All by Dr. Lajoyce Corners 01/03/2013 diminutive polyp ascending colon - adenoma    . Small bowel obstruction ([place]) 07/05/2014  . Umbilical hernia, incarcerated- surgery 07/10/14 07/05/2014    ALLERGIES:  has No Known Allergies.  MEDICATIONS:  Current Outpatient Prescriptions  Medication Sig Dispense Refill  . furosemide (LASIX) 20 MG tablet TAKE 1 TABLET BY MOUTH ONCE DAILY 90 tablet 2  . Lactobacillus (DIGESTIVE HEALTH PROBIOTIC) CAPS Take 1 capsule by mouth daily. "30 million cultures"    . lidocaine-prilocaine (EMLA) cream Apply 1 application topically as needed.      . Linoleic Acid-Sunflower Oil (CLA PO) Take 2 tablets by mouth 2 (two) times daily.    . Liver Extract (LIVER PO) Take 1 tablet by mouth daily.    . metoprolol tartrate (LOPRESSOR) 25 MG tablet Take 1 tablet (25 mg total) by mouth 2 (two) times daily. 180 tablet 3  . metroNIDAZOLE (METROGEL) 0.75 % gel Apply 1 application topically 2 (two) times daily as needed (flare up.).     Marland Kitchen Milk Thistle 200 MG CAPS Take  1 capsule by mouth every morning.    . Multiple Vitamins-Minerals (MULTIVITAMINS THER. W/MINERALS) TABS Take 1 tablet by mouth every morning.     . naproxen sodium (ANAPROX) 220 MG tablet Take 440 mg by mouth 2 (two) times daily as needed (pain.).    Marland Kitchen nystatin-triamcinolone (MYCOLOG II) cream Apply 1 application topically daily as needed (rash/flare up.).     Marland Kitchen OVER THE COUNTER MEDICATION Take 2 tablets by mouth 2 (two) times daily. LUNG SUPPORT    . oxyCODONE (OXY IR/ROXICODONE) 5 MG immediate release tablet Take 1-2 tablets (5-10 mg total) by mouth every 6 (six) hours as needed for moderate pain, severe pain or breakthrough  pain. 50 tablet 0  . oxyCODONE-acetaminophen (PERCOCET/ROXICET) 5-325 MG per tablet Take 1-2 tablets by mouth every 4 (four) hours as needed for moderate pain or severe pain. 50 tablet 0  . PROAIR HFA 108 (90 BASE) MCG/ACT inhaler USE 2 PUFFS EVERY 6 HOURS AS NEEDED  6  . Red Yeast Rice 600 MG CAPS Take 1 capsule (600 mg total) by mouth daily.    Marland Kitchen Specialty Vitamins Products (PROSTATE PO) Take 1 tablet by mouth 2 (two) times daily.    Marland Kitchen SYNTHROID 175 MCG tablet Take 175 mcg by mouth every morning.     . tiotropium (SPIRIVA) 18 MCG inhalation capsule Place 18 mcg into inhaler and inhale every morning.      No current facility-administered medications for this visit.    SURGICAL HISTORY:  Past Surgical History  Procedure Laterality Date  . Hernia repair  70's  . Vasectomy  70's  . Finger surgery  2008    left pointer finger  . Knee arthroscopy  2008    left knee  . Tonsillectomy      as a child  . Colonoscopy w/ biopsies      multiple   . Cardiac catheterization  2003  . Esophagogastroduodenoscopy  2006  . Portacath placement  08/10/2011    Procedure: INSERTION PORT-A-CATH;  Surgeon: Pierre Bali, MD;  Location: [facility];  Service: Thoracic;  Laterality: Left;  Left Subclavian  . Abdominal aortic aneurysm repair N/A 10/31/2013    Procedure: aorto to left external iliac and right external iliac and left internal iliac, reimplantation of inferior mesenteric artery and ligation of left iliac artery aneursym;  Surgeon: Elam Dutch, MD;  Location: [facility];  Service: Vascular;  Laterality: N/A;  . Thrombectomy iliac artery Bilateral 10/31/2013    Procedure: THROMBECTOMY ILIAC ARTERY;  Surgeon: Elam Dutch, MD;  Location: [facility];  Service: Vascular;  Laterality: Bilateral;  . Abdominal wound dehiscence N/A 11/10/2013    Procedure: ABDOMINAL WOUND CLOSURE ;  Surgeon: Serafina Mitchell, MD;  Location: [facility];  Service: Vascular;  Laterality: N/A;  . Laparotomy N/A 07/10/2014    Procedure:  EXPLORATORY LAPAROTOMY;  Surgeon: [name], MD;  Location: [facility];  Service: General;  Laterality: N/A;  . Lysis of adhesion N/A 07/10/2014    Procedure: LYSIS OF ADHESION - 90 minutes;  Surgeon: [name], MD;  Location: [facility];  Service: General;  Laterality: N/A;  . Bowel resection N/A 07/10/2014    Procedure: SMALL BOWEL RESECTION;  Surgeon: [name], MD;  Location: [facility];  Service: General;  Laterality: N/A;  . Incisional hernia repair N/A 07/10/2014    Procedure: HERNIA REPAIR INCISIONAL;  Surgeon: [name], MD;  Location: [facility];  Service: General;  Laterality: N/A;  . Abdominal aortagram  10/20/2013    Procedure: ABDOMINAL Maxcine Ham;  Surgeon: Elam Dutch, MD;  Location: [facility] CATH LAB;  Service: Cardiovascular;;  . Lower extremity angiogram Bilateral 10/20/2013    Procedure: LOWER EXTREMITY ANGIOGRAM;  Surgeon: Elam Dutch, MD;  Location: [facility] CATH LAB;  Service: Cardiovascular;  Laterality: Bilateral;  . Ventral hernia repair N/A 04/04/2015    Procedure: OPEN REPAIR OF RECURRENT ABDOMINAL WALL HERNIA WITH MESH, COMPLEX LYSIS OF ADHESIONS X1 HR TAR COMPONENT SEPARATION;  Surgeon: Greer Pickerel, MD;  Location: [facility];  Service: General;  Laterality: N/A;    REVIEW OF SYSTEMS:  A comprehensive review of systems was negative.   PHYSICAL EXAMINATION: General appearance: alert, cooperative and no distress Head: Normocephalic, without obvious abnormality, atraumatic Neck: no adenopathy Lymph nodes: Cervical, supraclavicular, and axillary nodes normal. Resp: clear to auscultation bilaterally Cardio: regular rate and rhythm, S1, S2 normal, no murmur, click, rub or gallop GI: soft, non-tender; bowel sounds normal; no masses,  no organomegaly Extremities: extremities normal, atraumatic, no cyanosis or edema  ECOG PERFORMANCE STATUS: 1 - Symptomatic but completely ambulatory  Blood pressure 121/77, pulse 81, temperature 98.3 F (36.8 C), temperature source Oral, resp. rate  17, height 6' (1.829 m), weight 211 lb (95.709 kg), SpO2 94 %.  LABORATORY DATA: Lab Results  Component Value Date   WBC 5.4 07/08/2015   HGB 15.4 07/08/2015   HCT 44.6 07/08/2015   MCV 95.1 07/08/2015   PLT 117* 07/08/2015      Chemistry      Component Value Date/Time   NA 143 07/08/2015 1111   NA 132* 04/08/2015 0341   K 4.0 07/08/2015 1111   K 4.1 04/08/2015 0341   CL 106 04/08/2015 0341   CL 109* 08/01/2012 1046   CO2 24 07/08/2015 1111   CO2 24 04/08/2015 0341   BUN 17.2 07/08/2015 1111   BUN 14 04/08/2015 0341   CREATININE 0.9 07/08/2015 1111   CREATININE 0.77 04/08/2015 0341   CREATININE 1.00 08/17/2013 1250      Component Value Date/Time   CALCIUM 9.2 07/08/2015 1111   CALCIUM 7.7* 04/08/2015 0341   ALKPHOS 153* 07/08/2015 1111   ALKPHOS 138* 04/02/2015 1420   AST 33 07/08/2015 1111   AST 35 04/02/2015 1420   ALT 33 07/08/2015 1111   ALT 33 04/02/2015 1420   BILITOT 1.54* 07/08/2015 1111   BILITOT 1.9* 04/02/2015 1420       RADIOGRAPHIC STUDIES: Ct Chest W Contrast  07/08/2015  CLINICAL DATA:  Right lung cancer diagnosed 11/12. Chemotherapy and radiation therapy complete. Abdominal aortic aneurysm repair. Right middle lobe primary. Restaging. EXAM: CT CHEST WITH CONTRAST TECHNIQUE: Multidetector CT imaging of the chest was performed during intravenous contrast administration. CONTRAST:  41m OMNIPAQUE IOHEXOL 300 MG/ML  SOLN COMPARISON:  01/01/2015. FINDINGS: Mediastinum/Nodes: Similar small left supraclavicular/ low jugular nodes. A left Port-A-Cath which terminates at the high SVC. Thyroid atrophy versus surgical absence. Aortic and branch vessel atherosclerosis. Normal heart size, without pericardial effusion. Multivessel coronary artery atherosclerosis. No central pulmonary embolism, on this non-dedicated study. No mediastinal or hilar adenopathy. A periesophageal node just above the aortic hiatus is similar to slightly decreased in size at 8 mm (image 55,  series 2). Prevascular node is also similar to slightly decreased in size at 7 mm today. Lungs/Pleura: No pleural fluid. Advanced centrilobular emphysema with bullous disease in the upper lobes. Lower lobe predominant bronchial wall thickening. Inferior right upper lobe perifissural 9 mm pulmonary nodule (image 28, series 5),unchanged since the  prior exam. Posterior left upper lobe scarring. Paramediastinal radiation fibrosis involves the right upper lung. No evidence of residual or recurrent disease. Upper abdomen: Mild cirrhosis. Normal imaged portions of the spleen, stomach, pancreas, gallbladder, biliary tract, adrenal glands, kidneys. Musculoskeletal: No acute osseous abnormality. IMPRESSION: 1. No change in appearance of the lungs since 01/01/2015. 2. Bullous type emphysema with radiation changes within the paramediastinal right lung. 3. Similar 9 mm inferior right upper lobe pulmonary nodule. 4.  Atherosclerosis, including within the coronary arteries. 5. Cirrhosis. 6. Similar to slightly decreased size of mediastinal nodes, favoring a benign etiology. Electronically Signed   By: Abigail Miyamoto M.D.   On: 07/08/2015 14:08   ASSESSMENT AND PLAN: This is a very pleasant 74 years old white male with history of stage IIIA/IIIB non-small cell lung cancer status post concurrent chemoradiation followed by consolidation chemotherapy and he has been observation since May of 2013 with no evidence for disease progression. The patient is doing fine today was no specific complaints. The recent CT scan of the chest showed no significant evidence for disease progression. I discussed the scan results with the patient and his wife.  I recommended for him to continue on observation with repeat CT scan of the chest in 6 months.  The patient was advised to call immediately if he has any concerning symptoms in the interval. All questions were answered. The patient knows to call the clinic with any problems, questions or  concerns. We can certainly see the patient much sooner if necessary.  Disclaimer: This note was dictated with voice recognition software. Similar sounding words can inadvertently be transcribed and may not be corrected upon review.

## 2015-07-11 NOTE — Telephone Encounter (Signed)
Gave patient avs report and appointments for December 2016 thru May 2017. Added port flush appointment per patient. Flush for 12/24/15 will be done 01/07/16 when patient has lab/ct. Central will call re ct - patient aware.

## 2015-07-12 ENCOUNTER — Telehealth: Payer: Self-pay | Admitting: Internal Medicine

## 2015-07-12 NOTE — Telephone Encounter (Signed)
Returned wife's call re moving 12/20 flush to 12/15. Left message re new date/time for 12/15.

## 2015-08-12 ENCOUNTER — Telehealth: Payer: Self-pay | Admitting: Internal Medicine

## 2015-08-12 NOTE — Telephone Encounter (Signed)
returned call adn s.w. pt and r/s appt per pt request..Marland KitchenMarland KitchenMarland Kitchenpt ok adn aware

## 2015-08-13 ENCOUNTER — Ambulatory Visit (HOSPITAL_BASED_OUTPATIENT_CLINIC_OR_DEPARTMENT_OTHER): Payer: Medicare Other

## 2015-08-13 VITALS — BP 102/68 | HR 77 | Temp 97.4°F | Resp 16

## 2015-08-13 DIAGNOSIS — Z452 Encounter for adjustment and management of vascular access device: Secondary | ICD-10-CM | POA: Diagnosis not present

## 2015-08-13 DIAGNOSIS — Z85118 Personal history of other malignant neoplasm of bronchus and lung: Secondary | ICD-10-CM

## 2015-08-13 DIAGNOSIS — Z95828 Presence of other vascular implants and grafts: Secondary | ICD-10-CM

## 2015-08-13 MED ORDER — HEPARIN SOD (PORK) LOCK FLUSH 100 UNIT/ML IV SOLN
500.0000 [IU] | Freq: Once | INTRAVENOUS | Status: AC
  Administered 2015-08-13: 500 [IU] via INTRAVENOUS
  Filled 2015-08-13: qty 5

## 2015-08-13 MED ORDER — SODIUM CHLORIDE 0.9 % IJ SOLN
10.0000 mL | INTRAMUSCULAR | Status: DC | PRN
  Administered 2015-08-13: 10 mL via INTRAVENOUS
  Filled 2015-08-13: qty 10

## 2015-08-13 NOTE — Patient Instructions (Signed)

## 2015-08-14 ENCOUNTER — Ambulatory Visit: Payer: Medicare Other | Admitting: Cardiology

## 2015-08-29 ENCOUNTER — Encounter: Payer: Self-pay | Admitting: Cardiology

## 2015-08-29 ENCOUNTER — Ambulatory Visit (INDEPENDENT_AMBULATORY_CARE_PROVIDER_SITE_OTHER): Payer: Medicare Other | Admitting: Cardiology

## 2015-08-29 VITALS — BP 106/62 | HR 84 | Ht 72.0 in | Wt 213.0 lb

## 2015-08-29 DIAGNOSIS — I2510 Atherosclerotic heart disease of native coronary artery without angina pectoris: Secondary | ICD-10-CM | POA: Insufficient documentation

## 2015-08-29 DIAGNOSIS — I2583 Coronary atherosclerosis due to lipid rich plaque: Principal | ICD-10-CM

## 2015-08-29 DIAGNOSIS — E785 Hyperlipidemia, unspecified: Secondary | ICD-10-CM | POA: Diagnosis not present

## 2015-08-29 DIAGNOSIS — I5033 Acute on chronic diastolic (congestive) heart failure: Secondary | ICD-10-CM

## 2015-08-29 NOTE — Progress Notes (Signed)
Patient ID: FRANCISO DIERKS, male   DOB: [birth year], 74 y.o.   MRN: [id number]    Patient Name: Joseph [name]  Date of Encounter: 09/29/2013  Primary Care Provider: Janalyn Rouse, MD   Primary Cardiologist: Ena Dawley, H   Problem List   Reason for visit: main complain: follow up for CAD afib with RVR, SOB  Past Medical History   Diagnosis  Date   .  History of colon polyps  2006,2008   .  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  Hyperlipidemia      takes Pravastatin   .  Emphysema    .  Lung cancer      right upper lobe   .  Enlarged prostate      takes Rapaflo daily   .  Arthritis      left knee and back arthrtis   .  Hypothyroidism      takes Synthroid daily   .  Cataract    .  Hx of radiation therapy  09/02/11 to 10/19/11     chest   .  Diverticulosis    .  Cirrhosis      By radiography, seen on CT   .  Rosacea     Past Surgical History   Procedure  Laterality  Date   .  Hernia repair   70's   .  Vasectomy   70's   .  Finger surgery   2008     left pointer finger   .  Knee arthroscopy   2008     left knee   .  Tonsillectomy       as a child   .  Colonoscopy w/ biopsies       multiple   .  Cardiac catheterization   2003   .  Esophagogastroduodenoscopy   2006   .  Portacath placement   08/10/2011     Procedure: INSERTION PORT-A-CATH; Surgeon: Pierre Bali, MD; Location: [facility] OR; Service: Thoracic; Laterality: Left; Left Subclavian   Allergies  No Known Allergies    HPI  74 year old male with prior medical history of hyperlipidemia, known nonobstructive coronary artery disease was originally referred to [place] for preop evaluation prior to 4.4 cm AAA repair.  The patient states that approximately 10 years ago he was evaluated for coronary artery disease because of abnormal EKG that was followed by abnormal stress test and a cardiac catheterization that only showed minimal atherosclerotic plaque. The patient also has a history of lung cancer for which  she has been treated with chemotherapy and radiation therapy in 2012 and is currently in remission. The patient quit smoking 2 years ago and is being treated for COPD and emphysema with Spiriva.   On 10/31/2013 the patient underwent Aorto to left and right external iliac bypass, left internal iliac artery bypass, ligation right internal iliac aneurysm, reimplantation of inferior mesenteric artery. Thrombectomy of left and right iliac and femoral arteries. This was complicated with respiratory failure in addition to acute blood loss anemia and the patient received 6 units of FFP and 4 units of PRBCs in addition to 2 units of platelets during his admission. He was intubated and sedated for several days. He is feeling much better today and recovered very well. He denied having any significant chest pain, shortness of breath, cough or hemoptysis. He feels overall very tired and hasn't been back to preop baseline yet. He states that post surgery he retained  20 lbs of fluids but now is bak to baseline. Has some residual minimal LE edema.   He has known hyperlipidemia however he developed liver enzymes elevation after his chemotherapy and is being followed a GI specialist Dr. Charlott Holler, he has never been started on statins. He denies any alcohol intake.  07/23/2014 The patient was hospitalized with SBO, has had eval for both AAA progression and for an incarcerated incisional ventral hernia, s/p resection, mesh and wound vac placement. He was also diagnosed with new onset a-fib with RVR, started on amiodarone, no anticoagulation because of high risk of bleeding (recent surgery, ongoing incision wound, liver cirrhosis).  He was also started on home O2 therapy, coming for a follow up visit 5 days after discharge.  He feels weak, hypotensive at home, BP in 90', has not been taking lasix because of that. No palpitations. No chest pain, ongoing stable SOB after walking few steps. No PND, minimal LE edema.   08/29/2015  - the patient is coming after 6 months, he underwent hernia repair in July without complications, he had a stress test prior to the surgery with inferior wall infarct and mild peri-infarct ischemia.  He is currently asymptomatic, not compliant with aspirin, he is taking red yeast rice, livalo was discontinued as his Bili was elevated. LE edema has resolved. He denies DOE, no orthopnea, PND, chest pain.    Home Medications    Current outpatient prescriptions:  .  furosemide (LASIX) 20 MG tablet, TAKE 1 TABLET BY MOUTH ONCE DAILY, Disp: 90 tablet, Rfl: 2 .  Lactobacillus (DIGESTIVE HEALTH PROBIOTIC) CAPS, Take 1 capsule by mouth daily. "30 million cultures", Disp: , Rfl:  .  lidocaine-prilocaine (EMLA) cream, Apply 1 application topically as needed.  , Disp: , Rfl:  .  Linoleic Acid-Sunflower Oil (CLA PO), Take 2 tablets by mouth 2 (two) times daily., Disp: , Rfl:  .  Liver Extract (LIVER PO), Take 1 tablet by mouth daily., Disp: , Rfl:  .  metoprolol tartrate (LOPRESSOR) 25 MG tablet, Take 1 tablet (25 mg total) by mouth 2 (two) times daily., Disp: 180 tablet, Rfl: 3 .  metroNIDAZOLE (METROGEL) 0.75 % gel, Apply 1 application topically 2 (two) times daily as needed (flare up.). , Disp: , Rfl:  .  Milk Thistle 200 MG CAPS, Take 1 capsule by mouth every morning., Disp: , Rfl:  .  Multiple Vitamins-Minerals (MULTIVITAMINS THER. W/MINERALS) TABS, Take 1 tablet by mouth every morning. , Disp: , Rfl:  .  naproxen sodium (ANAPROX) 220 MG tablet, Take 440 mg by mouth 2 (two) times daily as needed (pain.)., Disp: , Rfl:  .  nystatin-triamcinolone (MYCOLOG II) cream, Apply 1 application topically daily as needed (rash/flare up.). , Disp: , Rfl:  .  OVER THE COUNTER MEDICATION, Take 2 tablets by mouth 2 (two) times daily. LUNG SUPPORT, Disp: , Rfl:  .  oxyCODONE (OXY IR/ROXICODONE) 5 MG immediate release tablet, Take 1-2 tablets (5-10 mg total) by mouth every 6 (six) hours as needed for moderate pain, severe  pain or breakthrough pain., Disp: 50 tablet, Rfl: 0 .  oxyCODONE-acetaminophen (PERCOCET/ROXICET) 5-325 MG per tablet, Take 1-2 tablets by mouth every 4 (four) hours as needed for moderate pain or severe pain., Disp: 50 tablet, Rfl: 0 .  PROAIR HFA 108 (90 BASE) MCG/ACT inhaler, USE 2 PUFFS EVERY 6 HOURS AS NEEDED, Disp: , Rfl: 6 .  Red Yeast Rice 600 MG CAPS, Take 1 capsule (600 mg total) by mouth daily., Disp: , Rfl:  .  Specialty Vitamins Products (PROSTATE PO), Take 1 tablet by mouth 2 (two) times daily., Disp: , Rfl:  .  SYNTHROID 175 MCG tablet, Take 175 mcg by mouth every morning. , Disp: , Rfl:  .  tiotropium (SPIRIVA) 18 MCG inhalation capsule, Place 18 mcg into inhaler and inhale every morning. , Disp: , Rfl:    Family History  Family History   Problem  Relation  Age of Onset   .  Anesthesia problems  Neg Hx    .  Hypotension  Neg Hx    .  Malignant hyperthermia  Neg Hx    .  Pseudochol deficiency  Neg Hx    .  Cancer  Father    .  Diabetes  Father    .  Hyperlipidemia  Brother    .  Aneurysm  Brother      Abdominal aortic aneurysm   .  Hyperlipidemia  Mother    .  Hyperlipidemia  Sister    Social History  History    Social History   .  Marital Status:  Married     Spouse Name:  N/A     Number of Children:  N/A   .  Years of Education:  N/A    Occupational History   .  Not on file.    Social History Main Topics   .  Smoking status:  Former Smoker -- 1.50 packs/day for 40 years     Types:  Cigarettes     Quit date:  07/02/2011   .  Smokeless tobacco:  Former Systems developer   .  Alcohol Use:  1.8 oz/week     3 Cans of beer per week      Comment: social   .  Drug Use:  No   .  Sexual Activity:  Not on file    Other Topics  Concern   .  Not on file    Social History Narrative   .  No narrative on file    Review of Systems, as per HPI, otherwise negative  General: No chills, fever, night sweats or weight changes.  Cardiovascular: No chest pain, dyspnea on exertion,  edema, orthopnea, palpitations, paroxysmal nocturnal dyspnea.  Dermatological: No rash, lesions/masses  Respiratory: No cough, dyspnea  Urologic: No hematuria, dysuria  Abdominal: No nausea, vomiting, diarrhea, bright red blood per rectum, melena, or hematemesis  Neurologic: No visual changes, wkns, changes in mental status.  All other systems reviewed and are otherwise negative except as noted above.   Physical Exam  Blood pressure 106/62 mmHg, pulse 84, height 6' (1.829 m), weight 213 lb (100.699 kg).  General: Pleasant, NAD  Psych: Normal affect.  Neuro: Alert and oriented X 3. Moves all extremities spontaneously.  HEENT: Normal  Neck: Supple without bruits or JVD.  Lungs: Resp regular and unlabored, crackles at both bases  Heart: RRR no s3, s4, or murmurs.  Abdomen: Soft, non-tender, non-distended, BS + x 4.  Extremities: No clubbing, cyanosis, mild B/L pitting edema up to mid calves. DP/PT/Radials 2+ and equal bilaterally.   Labs:  No results found for this basename: CKTOTAL, CKMB, TROPONINI, in the last 72 hours  Lab Results   Component  Value  Date    WBC  5.1  07/17/2013    HGB  14.6  07/17/2013    HCT  43.2  07/17/2013    MCV  99.2*  07/17/2013    PLT  134*  07/17/2013    Accessory Clinical Findings  Echocardiogram - 10/12/2013 - Left ventricle: The cavity size was normal. Systolic function was normal. The estimated ejection fraction was in the range of 55% to 60%. Wall motion was normal; there were no regional wall motion abnormalities. There was an increased relative contribution of atrial contraction to ventricular filling. Doppler parameters are consistent with abnormal left ventricular relaxation (grade 1 diastolic dysfunction). - Aortic valve: Moderate diffuse thickening and calcification, consistent with sclerosis. Trivial regurgitation. - Pulmonary arteries: PA peak pressure: 3m Hg (S). Impressions:  - The right ventricular systolic pressure was  increased consistent with mild pulmonary hypertension  ECG - sinus rhythm, normal ECG    Assessment & Plan   This is a 74year old male with a history of known nonobstructive CAD and HLP,  post Aorto to left and right external iliac bypass in 3/15, left internal iliac artery bypass, ligation right internal iliac aneurysm, reimplantation of inferior mesenteric artery. Thrombectomy of left and right iliac and femoral arteries. S/p SBO -> resection, incarcerated ventral hernia with repair , wound vac in place, new onset a-fib with RVR.  1. Non obstructive CAD - asymptomatic, no statins as prior liver enzymes elevation, only red yeast rice, restart aspirin, continue metoprolol. Inferior infarct with mild periinfarct ischemia on stress test in July 2015.  2. A-fib with RVR Maintaining NSR. Off amiodarone, on metoprolol 25 mg po BID,  Not a good anticoagulation candidate with bleeding risk and h/o cirrhosis  3. 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 He is almost euvolemic with no lower extremity edema on 20 mg of Lasix daily. Crea stable.   4. COPD - on home O2.   5. Hypothyroidism: started on synthroid  6. HLP - fatigue and muscle pain, elevated LFTs at the last visit, no statins, red yeast rice recommended.  Follow up in 6 months.    NDorothy Spark MD, F[facility] 09/29/2013, 1:59 PM

## 2015-08-29 NOTE — Patient Instructions (Signed)

## 2015-10-01 ENCOUNTER — Ambulatory Visit (HOSPITAL_BASED_OUTPATIENT_CLINIC_OR_DEPARTMENT_OTHER): Payer: Medicare Other

## 2015-10-01 VITALS — BP 110/72 | HR 75 | Temp 97.6°F | Resp 17

## 2015-10-01 DIAGNOSIS — Z85118 Personal history of other malignant neoplasm of bronchus and lung: Secondary | ICD-10-CM | POA: Diagnosis not present

## 2015-10-01 DIAGNOSIS — Z452 Encounter for adjustment and management of vascular access device: Secondary | ICD-10-CM

## 2015-10-01 DIAGNOSIS — Z95828 Presence of other vascular implants and grafts: Secondary | ICD-10-CM

## 2015-10-01 MED ORDER — SODIUM CHLORIDE 0.9% FLUSH
10.0000 mL | INTRAVENOUS | Status: DC | PRN
  Administered 2015-10-01: 10 mL via INTRAVENOUS
  Filled 2015-10-01: qty 10

## 2015-10-01 MED ORDER — HEPARIN SOD (PORK) LOCK FLUSH 100 UNIT/ML IV SOLN
500.0000 [IU] | Freq: Once | INTRAVENOUS | Status: AC
  Administered 2015-10-01: 500 [IU] via INTRAVENOUS
  Filled 2015-10-01: qty 5

## 2015-10-01 NOTE — Patient Instructions (Signed)

## 2015-10-16 ENCOUNTER — Encounter: Payer: Self-pay | Admitting: Cardiology

## 2015-11-06 ENCOUNTER — Other Ambulatory Visit: Payer: Self-pay | Admitting: Cardiology

## 2015-11-12 ENCOUNTER — Ambulatory Visit (HOSPITAL_BASED_OUTPATIENT_CLINIC_OR_DEPARTMENT_OTHER): Payer: Medicare Other

## 2015-11-12 VITALS — BP 115/73 | HR 77 | Temp 97.6°F | Resp 16

## 2015-11-12 DIAGNOSIS — Z452 Encounter for adjustment and management of vascular access device: Secondary | ICD-10-CM

## 2015-11-12 DIAGNOSIS — Z85118 Personal history of other malignant neoplasm of bronchus and lung: Secondary | ICD-10-CM | POA: Diagnosis not present

## 2015-11-12 DIAGNOSIS — Z95828 Presence of other vascular implants and grafts: Secondary | ICD-10-CM

## 2015-11-12 MED ORDER — HEPARIN SOD (PORK) LOCK FLUSH 100 UNIT/ML IV SOLN
500.0000 [IU] | Freq: Once | INTRAVENOUS | Status: AC
  Administered 2015-11-12: 500 [IU] via INTRAVENOUS
  Filled 2015-11-12: qty 5

## 2015-11-12 MED ORDER — SODIUM CHLORIDE 0.9% FLUSH
10.0000 mL | INTRAVENOUS | Status: DC | PRN
  Administered 2015-11-12: 10 mL via INTRAVENOUS
  Filled 2015-11-12: qty 10

## 2015-11-12 NOTE — Patient Instructions (Signed)

## 2015-11-20 ENCOUNTER — Encounter: Payer: Self-pay | Admitting: Vascular Surgery

## 2015-11-21 ENCOUNTER — Ambulatory Visit: Payer: Medicare Other | Admitting: Vascular Surgery

## 2015-11-25 ENCOUNTER — Other Ambulatory Visit: Payer: Self-pay | Admitting: Internal Medicine

## 2015-11-25 DIAGNOSIS — R7401 Elevation of levels of liver transaminase levels: Secondary | ICD-10-CM

## 2015-11-25 DIAGNOSIS — K7460 Unspecified cirrhosis of liver: Secondary | ICD-10-CM

## 2015-11-25 DIAGNOSIS — R740 Nonspecific elevation of levels of transaminase and lactic acid dehydrogenase [LDH]: Principal | ICD-10-CM

## 2015-11-25 DIAGNOSIS — R1011 Right upper quadrant pain: Secondary | ICD-10-CM

## 2015-11-26 ENCOUNTER — Encounter: Payer: Self-pay | Admitting: Internal Medicine

## 2015-11-28 ENCOUNTER — Ambulatory Visit
Admission: RE | Admit: 2015-11-28 | Discharge: 2015-11-28 | Disposition: A | Discharge status: Home / Self Care | Payer: Medicare Other | Source: Ambulatory Visit | Attending: Internal Medicine | Admitting: Internal Medicine

## 2015-11-28 ENCOUNTER — Ambulatory Visit: Payer: Medicare Other | Admitting: Vascular Surgery

## 2015-11-28 DIAGNOSIS — R740 Nonspecific elevation of levels of transaminase and lactic acid dehydrogenase [LDH]: Principal | ICD-10-CM

## 2015-11-28 DIAGNOSIS — R7401 Elevation of levels of liver transaminase levels: Secondary | ICD-10-CM

## 2015-11-28 DIAGNOSIS — K7460 Unspecified cirrhosis of liver: Secondary | ICD-10-CM

## 2015-11-28 DIAGNOSIS — R1011 Right upper quadrant pain: Secondary | ICD-10-CM

## 2015-12-02 HISTORY — PX: PTOSIS REPAIR: SHX6568

## 2015-12-10 ENCOUNTER — Encounter: Payer: Self-pay | Admitting: Cardiology

## 2015-12-11 ENCOUNTER — Encounter: Payer: Self-pay | Admitting: Vascular Surgery

## 2015-12-16 ENCOUNTER — Telehealth: Payer: Self-pay | Admitting: *Deleted

## 2015-12-16 NOTE — Telephone Encounter (Signed)
Notified the pt that per Dr Meda Coffee, she reviewed his labs scanned by his PCP at [facility], and notes that he has elevated LFTs and she recommends that we discontinue red yeast rice.  Per the pt, he states that his GI MD already D/C this about a week ago.  Informed the pt that I will remove this out of his medication list.  Pt verbalized understanding and agrees with this plan.

## 2015-12-16 NOTE — Telephone Encounter (Signed)
-----   Message from Dorothy Spark, MD sent at 12/16/2015  8:36 AM EDT ----- Elevated LFTs, discontinue red yeast rice

## 2015-12-19 ENCOUNTER — Encounter: Payer: Self-pay | Admitting: Vascular Surgery

## 2015-12-19 ENCOUNTER — Ambulatory Visit (INDEPENDENT_AMBULATORY_CARE_PROVIDER_SITE_OTHER): Payer: Medicare Other | Admitting: Vascular Surgery

## 2015-12-19 VITALS — BP 105/76 | HR 80 | Temp 97.3°F | Resp 18 | Ht 72.0 in | Wt 213.5 lb

## 2015-12-19 DIAGNOSIS — I714 Abdominal aortic aneurysm, without rupture, unspecified: Secondary | ICD-10-CM

## 2015-12-19 NOTE — Addendum Note (Signed)
Addended by: Mena Goes on: 12/19/2015 04:21 PM   Modules accepted: Orders

## 2015-12-19 NOTE — Progress Notes (Signed)
    History of Present Illness  The patient is a 75 y.o.  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 He currently denies any significant abdominal pain. He subsequently underwent ventral hernia repair by Dr. Redmond Pulling and redo recently.  He denies any abdominal or back pain. He had an ultrasound done of his abdomen recently. This showed the aorta was 3 cm in diameter. He was concerned because it suggested he needed follow-up in 3 years. Physical Examination     Filed Vitals:   12/19/15 1229  BP: 105/76  Pulse: 80  Temp: 97.3 F (36.3 C)  TempSrc: Oral  Resp: 18  Height: 6' (1.829 m)  Weight: 213 lb 8 oz (96.843 kg)  SpO2: 92%   General: A&O x 3, WD overweight male in NAD  Gastrointestinal: soft, non-tender, no distension, obese, midline laparotomy scar,No hernia defect  I reviewed a report from an ultrasound performed March 2016. This showed an aortic diameter 3 cm. It was suggested he needed follow-up within 3 years. However, it was not noted that he has previously had prior open repair and most likely this represents some mild graft dilation which is very typical.  Assessment/Plan  The patient is a 75 y.o. male who is s/p open AAA repair on 10/20/13.  Overall stable from this. Current aortic diameter 3 cm. Follow-up CT angiogram abdomen and pelvis in 5 years.  Ruta Hinds, MD [facility] Office: [phone] Pager: [phone]

## 2015-12-30 ENCOUNTER — Ambulatory Visit (AMBULATORY_SURGERY_CENTER): Payer: Self-pay

## 2015-12-30 VITALS — Ht 71.5 in | Wt 210.4 lb

## 2015-12-30 DIAGNOSIS — K7460 Unspecified cirrhosis of liver: Secondary | ICD-10-CM

## 2015-12-30 NOTE — Progress Notes (Signed)
No allergies to eggs or soy No past problems with anesthesia No diet meds No home oxygen  Has email and internet registered for emmi

## 2016-01-02 ENCOUNTER — Encounter: Payer: Self-pay | Admitting: Internal Medicine

## 2016-01-07 ENCOUNTER — Ambulatory Visit (HOSPITAL_COMMUNITY)
Admission: RE | Admit: 2016-01-07 | Discharge: 2016-01-07 | Disposition: A | Discharge status: Home / Self Care | Payer: Medicare Other | Source: Ambulatory Visit | Attending: Internal Medicine | Admitting: Internal Medicine

## 2016-01-07 ENCOUNTER — Other Ambulatory Visit (HOSPITAL_BASED_OUTPATIENT_CLINIC_OR_DEPARTMENT_OTHER): Payer: Medicare Other

## 2016-01-07 ENCOUNTER — Telehealth: Payer: Self-pay | Admitting: *Deleted

## 2016-01-07 ENCOUNTER — Encounter (HOSPITAL_COMMUNITY): Payer: Self-pay

## 2016-01-07 DIAGNOSIS — K5730 Diverticulosis of large intestine without perforation or abscess without bleeding: Secondary | ICD-10-CM | POA: Insufficient documentation

## 2016-01-07 DIAGNOSIS — C3411 Malignant neoplasm of upper lobe, right bronchus or lung: Secondary | ICD-10-CM

## 2016-01-07 DIAGNOSIS — J439 Emphysema, unspecified: Secondary | ICD-10-CM | POA: Insufficient documentation

## 2016-01-07 DIAGNOSIS — I2510 Atherosclerotic heart disease of native coronary artery without angina pectoris: Secondary | ICD-10-CM | POA: Diagnosis not present

## 2016-01-07 LAB — CBC WITH DIFFERENTIAL/PLATELET
BASO%: 0.5 % (ref 0.0–2.0)
BASOS ABS: 0 10*3/uL (ref 0.0–0.1)
EOS ABS: 0.1 10*3/uL (ref 0.0–0.5)
EOS%: 1.5 % (ref 0.0–7.0)
HEMATOCRIT: 44.2 % (ref 38.4–49.9)
HEMOGLOBIN: 14.8 g/dL (ref 13.0–17.1)
LYMPH%: 19.4 % (ref 14.0–49.0)
MCH: 32.7 pg (ref 27.2–33.4)
MCHC: 33.4 g/dL (ref 32.0–36.0)
MCV: 97.8 fL (ref 79.3–98.0)
MONO#: 0.7 10*3/uL (ref 0.1–0.9)
MONO%: 9.9 % (ref 0.0–14.0)
NEUT%: 68.7 % (ref 39.0–75.0)
NEUTROS ABS: 4.5 10*3/uL (ref 1.5–6.5)
Platelets: 140 10*3/uL (ref 140–400)
RBC: 4.52 10*6/uL (ref 4.20–5.82)
RDW: 14.2 % (ref 11.0–14.6)
WBC: 6.6 10*3/uL (ref 4.0–10.3)
lymph#: 1.3 10*3/uL (ref 0.9–3.3)

## 2016-01-07 LAB — COMPREHENSIVE METABOLIC PANEL
ALBUMIN: 3.1 g/dL — AB (ref 3.5–5.0)
ALK PHOS: 350 U/L — AB (ref 40–150)
ALT: 64 U/L — AB (ref 0–55)
AST: 61 U/L — AB (ref 5–34)
Anion Gap: 7 mEq/L (ref 3–11)
BUN: 13.9 mg/dL (ref 7.0–26.0)
CO2: 22 mEq/L (ref 22–29)
CREATININE: 0.8 mg/dL (ref 0.7–1.3)
Calcium: 8.9 mg/dL (ref 8.4–10.4)
Chloride: 111 mEq/L — ABNORMAL HIGH (ref 98–109)
EGFR: 88 mL/min/{1.73_m2} — ABNORMAL LOW (ref >90)
GLUCOSE: 98 mg/dL (ref 70–140)
POTASSIUM: 3.9 meq/L (ref 3.5–5.1)
SODIUM: 139 meq/L (ref 136–145)
Total Bilirubin: 1.79 mg/dL — ABNORMAL HIGH (ref 0.20–1.20)
Total Protein: 6.7 g/dL (ref 6.4–8.3)

## 2016-01-07 MED ORDER — IOPAMIDOL (ISOVUE-300) INJECTION 61%
75.0000 mL | Freq: Once | INTRAVENOUS | Status: AC | PRN
  Administered 2016-01-07: 75 mL via INTRAVENOUS

## 2016-01-07 NOTE — Telephone Encounter (Signed)
Call report from [facility] with [facility].  Patient s/p CT Chest today.  Dr. Polly Cobia wants Dr. Julien Nordmann to be aware of possible acute colonic diverticulitis.    "Impression number 2. New colonic wall thickening and pericolonic fat stranding at the splenic flexure of the colon associated with colonic diverticulosis, suggestive of acute diverticulitis. Clinical correlation is Necessary."

## 2016-01-08 NOTE — Telephone Encounter (Signed)
Ct scan report faxed to Dr Brigitte Pulse and given to [facility]

## 2016-01-09 ENCOUNTER — Ambulatory Visit (HOSPITAL_BASED_OUTPATIENT_CLINIC_OR_DEPARTMENT_OTHER): Payer: Medicare Other | Admitting: Internal Medicine

## 2016-01-09 ENCOUNTER — Other Ambulatory Visit: Payer: Self-pay | Admitting: Medical Oncology

## 2016-01-09 ENCOUNTER — Encounter: Payer: Self-pay | Admitting: Internal Medicine

## 2016-01-09 ENCOUNTER — Telehealth: Payer: Self-pay | Admitting: Internal Medicine

## 2016-01-09 VITALS — BP 144/90 | HR 80 | Temp 97.9°F | Resp 18 | Ht 71.5 in | Wt 213.5 lb

## 2016-01-09 DIAGNOSIS — R933 Abnormal findings on diagnostic imaging of other parts of digestive tract: Secondary | ICD-10-CM

## 2016-01-09 DIAGNOSIS — Z85118 Personal history of other malignant neoplasm of bronchus and lung: Secondary | ICD-10-CM

## 2016-01-09 DIAGNOSIS — C3411 Malignant neoplasm of upper lobe, right bronchus or lung: Secondary | ICD-10-CM

## 2016-01-09 NOTE — Telephone Encounter (Signed)
Gave pt apt & avs °

## 2016-01-09 NOTE — Telephone Encounter (Signed)
He needs abd/pelvic ct w/ contrast re" abnormal colon CT chest ? Diverticulitis

## 2016-01-09 NOTE — Telephone Encounter (Signed)
Patient had a CT scan by Dr. Julien Nordmann that showed Diverticulitis.  Dr. Julien Nordmann had him call here for treatment because he is having a EGD next week with you.  Patient states that he has some discomfort, but not pain in his LLQ.  Please advise

## 2016-01-09 NOTE — Progress Notes (Signed)
[place] [phone]   [phone]  OFFICE PROGRESS NOTE  [name], MD [place] [address]  DIAGNOSIS: Stage IIIA/IIIB non-small cell lung cancer   PRIOR THERAPY:  1) Concurrent chemoradiation with chemotherapy in the form of weekly carboplatin for an AUC of 2 and paclitaxel at 45 mg/m2.  2) Consolidation chemotherapy with carboplatin for AUC of 5 and paclitaxel 175 mg/M2 every 3 weeks with Neulasta support, status post 3 cycles, last cycle was given on 01/05/2012 with partial response.   CURRENT THERAPY: Observation.  INTERVAL HISTORY: Joseph [name] 75 y.o. [name] returns to the clinic today for followup visit. He has been observation since May 2013. The patient is feeling fine today with no specific complaints. He denied having any significant chest pain, shortness of breath, cough or hemoptysis. He denied having any significant weight loss or night sweats. He is scheduled to see Dr. Carlean Purl for upper endoscopy next week. The patient had repeat CT scan of the chest performed recently and he is here for evaluation and discussion of his scan results.  MEDICAL HISTORY: Past Medical History  Diagnosis Date  . History of colon polyps 2006,2008  . Hyperlipidemia     takes Pravastatin  . Emphysema   . Enlarged prostate   . Arthritis     left knee and back arthrtis  . Hypothyroidism     takes Synthroid daily  . Cataract   . Hx of radiation therapy 09/02/11 to 10/19/11    chest  . Diverticulosis   . Cirrhosis ([place])     By radiography, seen on CT  . Rosacea   . CAD (coronary artery disease)   . Heart murmur     teen  . Acute on chronic diastolic CHF (congestive heart failure), NYHA class 3 ([place]) 07/23/2014  . Lung cancer ([place])     right upper lobe  . Shortness of breath     with exertion  . History of transfusion 2015  . Status post chemotherapy     last chemo 2013  . Atrial fibrillation with rapid  ventricular response ([place]) 07/04/2014  . Hx of radiation therapy   . Personal history of adenomatous colonic polyps 07/04/2012    2006, adenomatous right colon polyp removed, then ablated in 2007 followup, 2008, adenomatous polyp of the right colon removed with hot biopsy forceps. All by Dr. Lajoyce Corners 01/03/2013 diminutive polyp ascending colon - adenoma    . Small bowel obstruction ([place]) 07/05/2014  . Umbilical hernia, incarcerated- surgery 07/10/14 07/05/2014    ALLERGIES:  has No Known Allergies.  MEDICATIONS:  Current Outpatient Prescriptions  Medication Sig Dispense Refill  . furosemide (LASIX) 20 MG tablet TAKE 1 TABLET BY MOUTH ONCE DAILY 90 tablet 2  . Lactobacillus (DIGESTIVE HEALTH PROBIOTIC) CAPS Take 1 capsule by mouth daily. "30 million cultures"    . Liver Extract (LIVER PO) Take 1 tablet by mouth daily.    . metoprolol tartrate (LOPRESSOR) 25 MG tablet Take 1 tablet (25 mg total) by mouth 2 (two) times daily. 180 tablet 3  . Milk Thistle 200 MG CAPS Take 1 capsule by mouth every morning.    . Multiple Vitamins-Minerals (MULTIVITAMINS THER. W/MINERALS) TABS Take 1 tablet by mouth every morning.     Marland Kitchen PROAIR HFA 108 (90 BASE) MCG/ACT inhaler USE 2 PUFFS EVERY 6 HOURS AS NEEDED  6  . SYNTHROID 175 MCG tablet Take 175 mcg by mouth every morning.     Marland Kitchen  tiotropium (SPIRIVA) 18 MCG inhalation capsule Place 18 mcg into inhaler and inhale every morning.     Marland Kitchen OVER THE COUNTER MEDICATION Take 2 tablets by mouth 2 (two) times daily. LUNG SUPPORT    . Specialty Vitamins Products (PROSTATE PO) Take 1 tablet by mouth 2 (two) times daily.     No current facility-administered medications for this visit.    SURGICAL HISTORY:  Past Surgical History  Procedure Laterality Date  . Hernia repair  70's  . Vasectomy  70's  . Finger surgery  2008    left pointer finger  . Knee arthroscopy  2008    left knee  . Tonsillectomy      as a child  . Colonoscopy w/ biopsies      multiple   . Cardiac  catheterization  2003  . Esophagogastroduodenoscopy  2006  . Portacath placement  08/10/2011    Procedure: INSERTION PORT-A-CATH;  Surgeon: Pierre Bali, MD;  Location: [place];  Service: Thoracic;  Laterality: Left;  Left Subclavian  . Abdominal aortic aneurysm repair N/A 10/31/2013    Procedure: aorto to left external iliac and right external iliac and left internal iliac, reimplantation of inferior mesenteric artery and ligation of left iliac artery aneursym;  Surgeon: Elam Dutch, MD;  Location: [place];  Service: Vascular;  Laterality: N/A;  . Thrombectomy iliac artery Bilateral 10/31/2013    Procedure: THROMBECTOMY ILIAC ARTERY;  Surgeon: Elam Dutch, MD;  Location: [place];  Service: Vascular;  Laterality: Bilateral;  . Abdominal wound dehiscence N/A 11/10/2013    Procedure: ABDOMINAL WOUND CLOSURE ;  Surgeon: Serafina Mitchell, MD;  Location: [place];  Service: Vascular;  Laterality: N/A;  . Laparotomy N/A 07/10/2014    Procedure: EXPLORATORY LAPAROTOMY;  Surgeon: [name], MD;  Location: [place];  Service: General;  Laterality: N/A;  . Lysis of adhesion N/A 07/10/2014    Procedure: LYSIS OF ADHESION - 90 minutes;  Surgeon: [name], MD;  Location: [place];  Service: General;  Laterality: N/A;  . Bowel resection N/A 07/10/2014    Procedure: SMALL BOWEL RESECTION;  Surgeon: [name], MD;  Location: [place];  Service: General;  Laterality: N/A;  . Incisional hernia repair N/A 07/10/2014    Procedure: HERNIA REPAIR INCISIONAL;  Surgeon: [name], MD;  Location: [place];  Service: General;  Laterality: N/A;  . Abdominal aortagram  10/20/2013    Procedure: ABDOMINAL Maxcine Ham;  Surgeon: Elam Dutch, MD;  Location: [facility] CATH LAB;  Service: Cardiovascular;;  . Lower extremity angiogram Bilateral 10/20/2013    Procedure: LOWER EXTREMITY ANGIOGRAM;  Surgeon: Elam Dutch, MD;  Location: [facility] CATH LAB;  Service: Cardiovascular;  Laterality: Bilateral;  . Ventral hernia repair N/A  04/04/2015    Procedure: OPEN REPAIR OF RECURRENT ABDOMINAL WALL HERNIA WITH MESH, COMPLEX LYSIS OF ADHESIONS X1 HR TAR COMPONENT SEPARATION;  Surgeon: Greer Pickerel, MD;  Location: [facility];  Service: General;  Laterality: N/A;  . Ptosis repair Bilateral 12-02-2015  . Bletharoplasty      REVIEW OF SYSTEMS:  A comprehensive review of systems was negative.   PHYSICAL EXAMINATION: General appearance: alert, cooperative and no distress Head: Normocephalic, without obvious abnormality, atraumatic Neck: no adenopathy Lymph nodes: Cervical, supraclavicular, and axillary nodes normal. Resp: clear to auscultation bilaterally Cardio: regular rate and rhythm, S1, S2 normal, no murmur, click, rub or gallop GI: soft, non-tender; bowel sounds normal; no masses,  no organomegaly Extremities: extremities normal, atraumatic, no  cyanosis or edema  ECOG PERFORMANCE STATUS: 1 - Symptomatic but completely ambulatory  Blood pressure 144/90, pulse 80, temperature 97.9 F (36.6 C), temperature source Oral, resp. rate 18, height 5' 11.5" (1.816 m), weight 213 lb 8 oz (96.843 kg), SpO2 97 %.  LABORATORY DATA: Lab Results  Component Value Date   WBC 6.6 01/07/2016   HGB 14.8 01/07/2016   HCT 44.2 01/07/2016   MCV 97.8 01/07/2016   PLT 140 01/07/2016      Chemistry      Component Value Date/Time   NA 139 01/07/2016 0923   NA 132* 04/08/2015 0341   K 3.9 01/07/2016 0923   K 4.1 04/08/2015 0341   CL 106 04/08/2015 0341   CL 109* 08/01/2012 1046   CO2 22 01/07/2016 0923   CO2 24 04/08/2015 0341   BUN 13.9 01/07/2016 0923   BUN 14 04/08/2015 0341   CREATININE 0.8 01/07/2016 0923   CREATININE 0.77 04/08/2015 0341   CREATININE 1.00 08/17/2013 1250      Component Value Date/Time   CALCIUM 8.9 01/07/2016 0923   CALCIUM 7.7* 04/08/2015 0341   ALKPHOS 350* 01/07/2016 0923   ALKPHOS 138* 04/02/2015 1420   AST 61* 01/07/2016 0923   AST 35 04/02/2015 1420   ALT 64* 01/07/2016 0923   ALT 33 04/02/2015 1420    BILITOT 1.79* 01/07/2016 0923   BILITOT 1.9* 04/02/2015 1420       RADIOGRAPHIC STUDIES: Ct Chest W Contrast  01/07/2016  CLINICAL DATA:  Right upper lobe non-small cell lung carcinoma diagnosed December 2012, status post chemotherapy and radiation therapy, presenting for restaging. EXAM: CT CHEST WITH CONTRAST TECHNIQUE: Multidetector CT imaging of the chest was performed during intravenous contrast administration. CONTRAST:  12m ISOVUE-300 IOPAMIDOL (ISOVUE-300) INJECTION 61% COMPARISON:  07/08/2015 chest CT. FINDINGS: Mediastinum/Nodes: Normal heart size. No pericardial fluid/thickening. Left anterior descending and right coronary atherosclerosis. Atherosclerotic nonaneurysmal thoracic aorta. Normal caliber pulmonary arteries. No central pulmonary emboli. Atrophic appearing thyroid gland. Normal esophagus. No pathologically enlarged axillary, mediastinal or hilar lymph nodes. Lungs/Pleura: No pneumothorax. No pleural effusion. Moderate to severe centrilobular and paraseptal emphysema and diffuse bronchial wall thickening. There is stable volume loss in sharply marginated bandlike consolidation in the right upper parahilar lung, in keeping with radiation fibrosis, unchanged. Subpleural 0.9 cm solid pulmonary nodule in the basilar right upper lobe associated with the minor fissure (series 5/ image 85) is stable since at least 11/02/2012, in keeping with a benign nodule. No acute consolidative airspace disease or new significant pulmonary nodules. Upper abdomen: Liver surface is diffusely finely irregular, in keeping with cirrhosis. There is new mild wall thickening with associated pericolonic fat stranding at the splenic flexure of the colon (series 2/ image 109), which could indicate acute diverticulitis. Musculoskeletal: No aggressive appearing focal osseous lesions. Mild-to-moderate degenerative changes in the thoracic spine. IMPRESSION: 1. 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  No evidence of local tumor recurrence or metastatic disease in the chest. 2. New colonic wall thickening and pericolonic fat stranding at the splenic flexure of the colon associated with colonic diverticulosis, suggestive of acute diverticulitis. Clinical correlation is necessary. 3. Additional findings include coronary atherosclerosis, moderate to severe emphysema and diffuse bronchial wall thickening suggesting COPD, and cirrhosis. These results will be called to the ordering clinician or representative by the Radiologist Assistant, and communication documented in the PACS or zVision Dashboard. Electronically Signed   By: JIlona SorrelM.D.   On: 01/07/2016 13:41   ASSESSMENT AND PLAN:  This is a very pleasant 75 years old white [name] with history of stage IIIA/IIIB non-small cell lung cancer status post concurrent chemoradiation followed by consolidation chemotherapy and he has been observation since May of 2013 with no evidence for disease progression. The patient is doing fine today was no specific complaints. The recent CT scan of the chest showed no significant evidence for disease progression. It also showed some evidence of diverticulitis but the patient is currently asymptomatic. I recommended for him to discuss this result with his gastroenterologist next week. I also offered the patient treatment with antibiotics but he would like to wait until he sees his gastroenterologist. I discussed the scan results with the patient and his wife.  I recommended for him to continue on observation with repeat CT scan of the chest in 6 months.  The patient was advised to call immediately if he has any concerning symptoms in the interval. All questions were answered. The patient knows to call the clinic with any problems, questions or concerns. We can certainly see the patient much sooner if necessary.  Disclaimer: This note was dictated with voice recognition software. Similar sounding words can inadvertently be  transcribed and may not be corrected upon review.

## 2016-01-09 NOTE — Telephone Encounter (Signed)
Patient notified of recommendation  He wishes CT at [facility].  He is scheduled for 01/13/16 3:00 He is advised that he will need to go pick up contrast and instuctions at [facility] radiology

## 2016-01-13 ENCOUNTER — Ambulatory Visit (HOSPITAL_COMMUNITY)
Admission: RE | Admit: 2016-01-13 | Discharge: 2016-01-13 | Disposition: A | Discharge status: Home / Self Care | Payer: Medicare Other | Source: Ambulatory Visit | Attending: Internal Medicine | Admitting: Internal Medicine

## 2016-01-13 DIAGNOSIS — I8500 Esophageal varices without bleeding: Secondary | ICD-10-CM | POA: Diagnosis not present

## 2016-01-13 DIAGNOSIS — N400 Enlarged prostate without lower urinary tract symptoms: Secondary | ICD-10-CM | POA: Diagnosis not present

## 2016-01-13 DIAGNOSIS — K4090 Unilateral inguinal hernia, without obstruction or gangrene, not specified as recurrent: Secondary | ICD-10-CM | POA: Diagnosis not present

## 2016-01-13 DIAGNOSIS — R933 Abnormal findings on diagnostic imaging of other parts of digestive tract: Secondary | ICD-10-CM | POA: Diagnosis not present

## 2016-01-13 DIAGNOSIS — I700 Atherosclerosis of aorta: Secondary | ICD-10-CM | POA: Insufficient documentation

## 2016-01-13 DIAGNOSIS — J439 Emphysema, unspecified: Secondary | ICD-10-CM | POA: Diagnosis not present

## 2016-01-13 DIAGNOSIS — R161 Splenomegaly, not elsewhere classified: Secondary | ICD-10-CM | POA: Diagnosis not present

## 2016-01-13 DIAGNOSIS — I714 Abdominal aortic aneurysm, without rupture: Secondary | ICD-10-CM | POA: Insufficient documentation

## 2016-01-13 DIAGNOSIS — M47816 Spondylosis without myelopathy or radiculopathy, lumbar region: Secondary | ICD-10-CM | POA: Diagnosis not present

## 2016-01-13 MED ORDER — IOPAMIDOL (ISOVUE-300) INJECTION 61%
100.0000 mL | Freq: Once | INTRAVENOUS | Status: AC | PRN
  Administered 2016-01-13: 100 mL via INTRAVENOUS

## 2016-01-15 ENCOUNTER — Ambulatory Visit (AMBULATORY_SURGERY_CENTER): Payer: Medicare Other | Admitting: Internal Medicine

## 2016-01-15 ENCOUNTER — Encounter: Payer: Self-pay | Admitting: Internal Medicine

## 2016-01-15 VITALS — BP 120/79 | HR 84 | Temp 98.4°F | Resp 16

## 2016-01-15 DIAGNOSIS — Z13810 Encounter for screening for upper gastrointestinal disorder: Secondary | ICD-10-CM

## 2016-01-15 DIAGNOSIS — K7460 Unspecified cirrhosis of liver: Secondary | ICD-10-CM

## 2016-01-15 MED ORDER — SODIUM CHLORIDE 0.9 % IV SOLN: 500.0000 mL | INTRAVENOUS | Status: DC

## 2016-01-15 NOTE — Patient Instructions (Addendum)
No problems related to cirrhosis found on endoscopy in fact it is normal. We can recheck in 3 years.  The CT scan did not show diverticulitis or any other new problems.  I appreciate the opportunity to care for you. Gatha Mayer, MD, FACG     YOU HAD AN ENDOSCOPIC PROCEDURE TODAY AT [facility]:   Refer to the procedure report that was given to you for any specific questions about what was found during the examination.  If the procedure report does not answer your questions, please call your gastroenterologist to clarify.  If you requested that your care partner not be given the details of your procedure findings, then the procedure report has been included in a sealed envelope for you to review at your convenience later.  YOU SHOULD EXPECT: Some feelings of bloating in the abdomen. Passage of more gas than usual.  Walking can help get rid of the air that was put into your GI tract during the procedure and reduce the bloating. If you had a lower endoscopy (such as a colonoscopy or flexible sigmoidoscopy) you may notice spotting of blood in your stool or on the toilet paper. If you underwent a bowel prep for your procedure, you may not have a normal bowel movement for a few days.  Please Note:  You might notice some irritation and congestion in your nose or some drainage.  This is from the oxygen used during your procedure.  There is no need for concern and it should clear up in a day or so.  SYMPTOMS TO REPORT IMMEDIATELY:    Following upper endoscopy (EGD)  Vomiting of blood or coffee ground material  New chest pain or pain under the shoulder blades  Painful or persistently difficult swallowing  New shortness of breath  Fever of 100F or higher  Black, tarry-looking stools  For urgent or emergent issues, a gastroenterologist can be reached at any hour by calling [phone].   DIET: Your first meal following the procedure should be a small meal and  then it is ok to progress to your normal diet. Heavy or fried foods are harder to digest and may make you feel nauseous or bloated.  Likewise, meals heavy in dairy and vegetables can increase bloating.  Drink plenty of fluids but you should avoid alcoholic beverages for 24 hours.  ACTIVITY:  You should plan to take it easy for the rest of today and you should NOT DRIVE or use heavy machinery until tomorrow (because of the sedation medicines used during the test).    FOLLOW UP: Our staff will call the number listed on your records the next business day following your procedure to check on you and address any questions or concerns that you may have regarding the information given to you following your procedure. If we do not reach you, we will leave a message.  However, if you are feeling well and you are not experiencing any problems, there is no need to return our call.  We will assume that you have returned to your regular daily activities without incident.  If any biopsies were taken you will be contacted by phone or by letter within the next 1-3 weeks.  Please call us at [phone] if you have not heard about the biopsies in 3 weeks.    SIGNATURES/CONFIDENTIALITY: You and/or your care partner have signed paperwork which will be entered into your electronic medical record.  These signatures attest to the fact that  that the information above on your After Visit Summary has been reviewed and is understood.  Full responsibility of the confidentiality of this discharge information lies with you and/or your care-partner.    Return to GI clinic in 6 months. You may resume your current medications today. Repeat EGD, upper endoscopy, in 3 years for screening purposes. Please call if any questions or concerns.

## 2016-01-15 NOTE — Progress Notes (Signed)
Quick Note:  Reviewed in person NO DIVERTICULITIS and no sxs of that He will f/u PCP and Dr. Julien Nordmann as planned ______

## 2016-01-15 NOTE — Progress Notes (Signed)
No problems noted in the recovery room. maw 

## 2016-01-15 NOTE — Progress Notes (Signed)
A/ox3, pleased with MAC, report to RN 

## 2016-01-15 NOTE — Op Note (Signed)
[facility] Patient Name: Joseph [name] Procedure Date: 01/15/2016 10:13 AM MRN: [id number] Endoscopist: Gatha Mayer , MD Age: 75 Referring MD:  Date of Birth: 07-Mar-1941 Gender: Male Procedure:                Upper GI endoscopy Indications:              Screening procedure, Cirrhosis rule out esophageal                            varices Medicines:                Propofol per Anesthesia, Monitored Anesthesia Care Procedure:                Pre-Anesthesia Assessment:                           - Prior to the procedure, a History and Physical                            was performed, and patient medications and                            allergies were reviewed. The patient's tolerance of                            previous anesthesia was also reviewed. The risks                            and benefits of the procedure and the sedation                            options and risks were discussed with the patient.                            All questions were answered, and informed consent                            was obtained. Prior Anticoagulants: The patient has                            taken no previous anticoagulant or antiplatelet                            agents. ASA Grade Assessment: III - A patient with                            severe systemic disease. After reviewing the risks                            and benefits, the patient was deemed in                            satisfactory condition to undergo the procedure.  After obtaining informed consent, the endoscope was                            passed under direct vision. Throughout the                            procedure, the patient's blood pressure, pulse, and                            oxygen saturations were monitored continuously. The                            Model GIF-[id number]) scope was introduced                            through the mouth, and advanced to  the second part                            of duodenum. The upper GI endoscopy was                            accomplished without difficulty. The patient                            tolerated the procedure well. Scope In: Scope Out: Findings:                 The esophagus was normal.                           The stomach was normal.                           The examined duodenum was normal. Complications:            No immediate complications. Estimated Blood Loss:     Estimated blood loss: none. Impression:               - Normal esophagus.                           - Normal stomach.                           - Normal examined duodenum.                           - No specimens collected. Recommendation:           - Patient has a contact number available for                            emergencies. The signs and symptoms of potential                            delayed complications were discussed with the  patient. Return to normal activities tomorrow.                            Written discharge instructions were provided to the                            patient.                           - Resume previous diet.                           - Continue present medications.                           - Repeat upper endoscopy in 3 years for screening                            purposes.                           - Return to GI clinic in 6 months. Gatha Mayer, MD 01/15/2016 10:45:27 AM This report has been signed electronically.

## 2016-01-16 ENCOUNTER — Telehealth: Payer: Self-pay

## 2016-01-16 NOTE — Telephone Encounter (Signed)
  Follow up Call-  Call back number 01/15/2016  Post procedure Call Back phone  # [phone]  Permission to leave phone message Yes     Patient questions:  Do you have a fever, pain , or abdominal swelling? No. Pain Score  0 *  Have you tolerated food without any problems? Yes.    Have you been able to return to your normal activities? Yes.    Do you have any questions about your discharge instructions: Diet   No. Medications  No. Follow up visit  No.  Do you have questions or concerns about your Care? No.  Actions: * If pain score is 4 or above: No action needed, pain <4.

## 2016-01-23 ENCOUNTER — Other Ambulatory Visit: Payer: Self-pay | Admitting: Cardiology

## 2016-01-23 NOTE — Telephone Encounter (Signed)
Rx refill sent to pharmacy. 

## 2016-02-18 ENCOUNTER — Encounter: Payer: Self-pay | Admitting: Cardiology

## 2016-02-18 ENCOUNTER — Ambulatory Visit (INDEPENDENT_AMBULATORY_CARE_PROVIDER_SITE_OTHER): Payer: Medicare Other | Admitting: Cardiology

## 2016-02-18 VITALS — BP 126/68 | HR 79 | Ht 71.5 in | Wt 207.0 lb

## 2016-02-18 DIAGNOSIS — I739 Peripheral vascular disease, unspecified: Secondary | ICD-10-CM

## 2016-02-18 DIAGNOSIS — E785 Hyperlipidemia, unspecified: Secondary | ICD-10-CM | POA: Diagnosis not present

## 2016-02-18 DIAGNOSIS — I2583 Coronary atherosclerosis due to lipid rich plaque: Secondary | ICD-10-CM

## 2016-02-18 DIAGNOSIS — I2510 Atherosclerotic heart disease of native coronary artery without angina pectoris: Secondary | ICD-10-CM

## 2016-02-18 DIAGNOSIS — Z889 Allergy status to unspecified drugs, medicaments and biological substances status: Secondary | ICD-10-CM

## 2016-02-18 DIAGNOSIS — Z789 Other specified health status: Secondary | ICD-10-CM | POA: Insufficient documentation

## 2016-02-18 DIAGNOSIS — I5033 Acute on chronic diastolic (congestive) heart failure: Secondary | ICD-10-CM | POA: Diagnosis not present

## 2016-02-18 DIAGNOSIS — I10 Essential (primary) hypertension: Secondary | ICD-10-CM | POA: Insufficient documentation

## 2016-02-18 NOTE — Progress Notes (Signed)
Patient ID: LARANCE RATLEDGE, male   DOB: 1941/08/21, 75 y.o.   MRN: [id number]    Patient Name: Joseph [name]  Date of Encounter: 09/29/2013  Primary Care Provider: Janalyn Rouse, MD   Primary Cardiologist: Ena Dawley, H   Problem List   Reason for visit: main complain: follow up for CAD afib with RVR, SOB  Past Medical History   Diagnosis  Date   .  History of colon polyps  2006,2008   .  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  Hyperlipidemia      takes Pravastatin   .  Emphysema    .  Lung cancer      right upper lobe   .  Enlarged prostate      takes Rapaflo daily   .  Arthritis      left knee and back arthrtis   .  Hypothyroidism      takes Synthroid daily   .  Cataract    .  Hx of radiation therapy  09/02/11 to 10/19/11     chest   .  Diverticulosis    .  Cirrhosis      By radiography, seen on CT   .  Rosacea     Past Surgical History   Procedure  Laterality  Date   .  Hernia repair   70's   .  Vasectomy   70's   .  Finger surgery   2008     left pointer finger   .  Knee arthroscopy   2008     left knee   .  Tonsillectomy       as a child   .  Colonoscopy w/ biopsies       multiple   .  Cardiac catheterization   2003   .  Esophagogastroduodenoscopy   2006   .  Portacath placement   08/10/2011     Procedure: INSERTION PORT-A-CATH; Surgeon: Pierre Bali, MD; Location: [facility] OR; Service: Thoracic; Laterality: Left; Left Subclavian   Allergies  No Known Allergies    HPI  75 year old male with prior medical history of hyperlipidemia, known nonobstructive coronary artery disease was originally referred to [place] for preop evaluation prior to 4.4 cm AAA repair.  The patient states that approximately 10 years ago he was evaluated for coronary artery disease because of abnormal EKG that was followed by abnormal stress test and a cardiac catheterization that only showed minimal atherosclerotic plaque. The patient also has a history of lung cancer for which  she has been treated with chemotherapy and radiation therapy in 2012 and is currently in remission. The patient quit smoking 2 years ago and is being treated for COPD and emphysema with Spiriva.   On 10/31/2013 the patient underwent Aorto to left and right external iliac bypass, left internal iliac artery bypass, ligation right internal iliac aneurysm, reimplantation of inferior mesenteric artery. Thrombectomy of left and right iliac and femoral arteries. This was complicated with respiratory failure in addition to acute blood loss anemia and the patient received 6 units of FFP and 4 units of PRBCs in addition to 2 units of platelets during his admission. He was intubated and sedated for several days. He is feeling much better today and recovered very well. He denied having any significant chest pain, shortness of breath, cough or hemoptysis. He feels overall very tired and hasn't been back to preop baseline yet. He states that post surgery he retained  20 lbs of fluids but now is bak to baseline. Has some residual minimal LE edema.   He has known hyperlipidemia however he developed liver enzymes elevation after his chemotherapy and is being followed a GI specialist Dr. Charlott Holler, he has never been started on statins. He denies any alcohol intake.  07/23/2014 The patient was hospitalized with SBO, has had eval for both AAA progression and for an incarcerated incisional ventral hernia, s/p resection, mesh and wound vac placement. He was also diagnosed with new onset a-fib with RVR, started on amiodarone, no anticoagulation because of high risk of bleeding (recent surgery, ongoing incision wound, liver cirrhosis).  He was also started on home O2 therapy, coming for a follow up visit 5 days after discharge.  He feels weak, hypotensive at home, BP in 90', has not been taking lasix because of that. No palpitations. No chest pain, ongoing stable SOB after walking few steps. No PND, minimal LE edema.   08/29/2015  - the patient is coming after 6 months, he underwent hernia repair in July without complications, he had a stress test prior to the surgery with inferior wall infarct and mild peri-infarct ischemia.  He is currently asymptomatic, not compliant with aspirin, he is taking red yeast rice, livalo was discontinued as his Bili was elevated. LE edema has resolved. He denies DOE, no orthopnea, PND, chest pain.   02/18/2016 - the patient is coming after 6 months, he has been doing well, he underwent upper endoscopy, with no finding of his esophageal varices, however he LFTs have been elevated this and he is ready stress was discontinued. He was also taken off baby aspirin by his hepatologist. He denies any chest pain or shortness of breath, he does approximately mile and half a few times a week on his stationary bike at home. Denies any palpitations, complains of leg heaviness and claudications after walking short distances.   Home Medications    Current outpatient prescriptions:  .  furosemide (LASIX) 20 MG tablet, TAKE 1 TABLET BY MOUTH ONCE DAILY, Disp: 90 tablet, Rfl: 2 .  L-ARGININE PO, Take by mouth daily., Disp: , Rfl:  .  Lactobacillus (DIGESTIVE HEALTH PROBIOTIC) CAPS, Take 1 capsule by mouth daily. "30 million cultures", Disp: , Rfl:  .  metoprolol tartrate (LOPRESSOR) 25 MG tablet, TAKE 1 TABLET (25 MG TOTAL) BY MOUTH 2 (TWO) TIMES DAILY., Disp: 180 tablet, Rfl: 0 .  Milk Thistle 200 MG CAPS, Take 1 capsule by mouth every morning., Disp: , Rfl:  .  Multiple Vitamins-Minerals (MULTIVITAMINS THER. W/MINERALS) TABS, Take 1 tablet by mouth every morning. , Disp: , Rfl:  .  NON FORMULARY, Clear Lungs, daily, Disp: , Rfl:  .  OVER THE COUNTER MEDICATION, Take 2 tablets by mouth 2 (two) times daily. LUNG SUPPORT, Disp: , Rfl:  .  PROAIR HFA 108 (90 BASE) MCG/ACT inhaler, USE 2 PUFFS EVERY 6 HOURS AS NEEDED, Disp: , Rfl: 6 .  Specialty Vitamins Products (PROSTATE PO), Take 1 tablet by mouth 2 (two)  times daily., Disp: , Rfl:  .  SYNTHROID 175 MCG tablet, Take 175 mcg by mouth every morning. , Disp: , Rfl:  .  tiotropium (SPIRIVA) 18 MCG inhalation capsule, Place 18 mcg into inhaler and inhale every morning. , Disp: , Rfl:  .  vitamin B-12 (CYANOCOBALAMIN) 1000 MCG tablet, Take 1,000 mcg by mouth daily., Disp: , Rfl:  .  Liver Extract (LIVER PO), Take 1 tablet by mouth daily. Reported on 02/18/2016, Disp: , Rfl:  Family History  Family History   Problem  Relation  Age of Onset   .  Anesthesia problems  Neg Hx    .  Hypotension  Neg Hx    .  Malignant hyperthermia  Neg Hx    .  Pseudochol deficiency  Neg Hx    .  Cancer  Father    .  Diabetes  Father    .  Hyperlipidemia  Brother    .  Aneurysm  Brother      Abdominal aortic aneurysm   .  Hyperlipidemia  Mother    .  Hyperlipidemia  Sister    Social History  History    Social History   .  Marital Status:  Married     Spouse Name:  N/A     Number of Children:  N/A   .  Years of Education:  N/A    Occupational History   .  Not on file.    Social History Main Topics   .  Smoking status:  Former Smoker -- 1.50 packs/day for 40 years     Types:  Cigarettes     Quit date:  07/02/2011   .  Smokeless tobacco:  Former Systems developer   .  Alcohol Use:  1.8 oz/week     3 Cans of beer per week      Comment: social   .  Drug Use:  No   .  Sexual Activity:  Not on file    Other Topics  Concern   .  Not on file    Social History Narrative   .  No narrative on file    Review of Systems, as per HPI, otherwise negative  General: No chills, fever, night sweats or weight changes.  Cardiovascular: No chest pain, dyspnea on exertion, edema, orthopnea, palpitations, paroxysmal nocturnal dyspnea.  Dermatological: No rash, lesions/masses  Respiratory: No cough, dyspnea  Urologic: No hematuria, dysuria  Abdominal: No nausea, vomiting, diarrhea, bright red blood per rectum, melena, or hematemesis  Neurologic: No visual changes, wkns, changes  in mental status.  All other systems reviewed and are otherwise negative except as noted above.   Physical Exam  Blood pressure 106/62 mmHg, pulse 84, height 6' (1.829 m), weight 213 lb (100.699 kg).  General: Pleasant, NAD  Psych: Normal affect.  Neuro: Alert and oriented X 3. Moves all extremities spontaneously.  HEENT: Normal  Neck: Supple without bruits or JVD.  Lungs: Resp regular and unlabored, crackles at both bases  Heart: RRR no s3, s4, or murmurs.  Abdomen: Soft, non-tender, non-distended, BS + x 4.  Extremities: No clubbing, cyanosis, mild B/L pitting edema up to mid calves. Radials 2+ and equal bilaterally. DP/PT weak B/L  Labs:  No results found for this basename: CKTOTAL, CKMB, TROPONINI, in the last 72 hours  Lab Results   Component  Value  Date    WBC  5.1  07/17/2013    HGB  14.6  07/17/2013    HCT  43.2  07/17/2013    MCV  99.2*  07/17/2013    PLT  134*  07/17/2013    Accessory Clinical Findings   Echocardiogram - 10/12/2013 - Left ventricle: The cavity size was normal. Systolic function was normal. The estimated ejection fraction was in the range of 55% to 60%. Wall motion was normal; there were no regional wall motion abnormalities. There was an increased relative contribution of atrial contraction to ventricular filling. Doppler parameters are consistent with abnormal left  ventricular relaxation (grade 1 diastolic dysfunction). - Aortic valve: Moderate diffuse thickening and calcification, consistent with sclerosis. Trivial regurgitation. - Pulmonary arteries: PA peak pressure: 41m Hg (S). Impressions:  - The right ventricular systolic pressure was increased consistent with mild pulmonary hypertension  ECG - Done today 02/18/2016 shows sinus rhythm, normal ECG, unchanged from 01/22/2015.    Assessment & Plan   This is a 75year old male with a history of known nonobstructive CAD and HLP,  post Aorto to left and right external iliac bypass in  3/15, left internal iliac artery bypass, ligation right internal iliac aneurysm, reimplantation of inferior mesenteric artery. Thrombectomy of left and right iliac and femoral arteries. S/p SBO -> resection, incarcerated ventral hernia with repair , wound vac in place, PAF with RVR.  1. Non obstructive CAD - asymptomatic, no statins as prior liver enzymes elevation, aspirin on hold as well by his hematologist I would recommend to resume if okay from GI standpoint. Continue metoprolol. Inferior infarct with mild periinfarct ischemia on stress test in July 2015.  2. A-fib with RVR Maintaining NSR. Off amiodarone, on metoprolol 25 mg po BID,  Not a good anticoagulation candidate with bleeding risk and h/o cirrhosis  3. 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 He is almost euvolemic with no lower extremity edema on 20 mg of Lasix daily. Crea stable.   4. COPD - on home O2.   5. Hypothyroidism: started on synthroid  6. HLP - fatigue and muscle pain, elevated LFTs at the last visit, no statins, red yeast rice recommended, but elevated LFTs, off red yeast rice, from now on, no statins.  7. Claudications - suggested bilateral lower extremity arterial to sounds, he wants to send about it and let [place].  Follow up in 6 months.    NDorothy Spark MD, [facility] 09/29/2013, 1:59 PM

## 2016-02-18 NOTE — Patient Instructions (Signed)
Your physician recommends that you continue on your current medications as directed. Please refer to the Current Medication list given to you today.     Your physician wants you to follow-up in: 6 MONTHS WITH DR NELSON You will receive a reminder letter in the mail two months in advance. If you don't receive a letter, please call our office to schedule the follow-up appointment.  

## 2016-02-26 ENCOUNTER — Telehealth: Payer: Self-pay | Admitting: Internal Medicine

## 2016-02-26 NOTE — Telephone Encounter (Signed)
returned call and s.w. pt and r/s appt time.Marland KitchenMarland KitchenMarland KitchenMarland Kitchenpt ok and aware of new d.t

## 2016-02-27 ENCOUNTER — Ambulatory Visit (HOSPITAL_BASED_OUTPATIENT_CLINIC_OR_DEPARTMENT_OTHER): Payer: Medicare Other

## 2016-02-27 DIAGNOSIS — Z452 Encounter for adjustment and management of vascular access device: Secondary | ICD-10-CM | POA: Diagnosis not present

## 2016-02-27 DIAGNOSIS — C3411 Malignant neoplasm of upper lobe, right bronchus or lung: Secondary | ICD-10-CM

## 2016-02-27 DIAGNOSIS — Z95828 Presence of other vascular implants and grafts: Secondary | ICD-10-CM | POA: Insufficient documentation

## 2016-02-27 MED ORDER — HEPARIN SOD (PORK) LOCK FLUSH 100 UNIT/ML IV SOLN
500.0000 [IU] | Freq: Once | INTRAVENOUS | Status: AC | PRN
  Administered 2016-02-27: 500 [IU] via INTRAVENOUS
  Filled 2016-02-27: qty 5

## 2016-02-27 MED ORDER — SODIUM CHLORIDE 0.9 % IJ SOLN
10.0000 mL | INTRAMUSCULAR | Status: DC | PRN
Start: 2016-02-27 — End: 2016-02-27
  Administered 2016-02-27: 10 mL via INTRAVENOUS
  Filled 2016-02-27: qty 10

## 2016-02-27 NOTE — Patient Instructions (Signed)

## 2016-04-16 ENCOUNTER — Ambulatory Visit (HOSPITAL_BASED_OUTPATIENT_CLINIC_OR_DEPARTMENT_OTHER): Payer: Medicare Other

## 2016-04-16 DIAGNOSIS — C3411 Malignant neoplasm of upper lobe, right bronchus or lung: Secondary | ICD-10-CM

## 2016-04-16 DIAGNOSIS — Z452 Encounter for adjustment and management of vascular access device: Secondary | ICD-10-CM

## 2016-04-16 DIAGNOSIS — Z95828 Presence of other vascular implants and grafts: Secondary | ICD-10-CM

## 2016-04-16 MED ORDER — SODIUM CHLORIDE 0.9 % IJ SOLN
10.0000 mL | INTRAMUSCULAR | Status: DC | PRN
  Administered 2016-04-16: 10 mL via INTRAVENOUS
  Filled 2016-04-16: qty 10

## 2016-04-16 MED ORDER — HEPARIN SOD (PORK) LOCK FLUSH 100 UNIT/ML IV SOLN
500.0000 [IU] | Freq: Once | INTRAVENOUS | Status: AC | PRN
  Administered 2016-04-16: 500 [IU] via INTRAVENOUS
  Filled 2016-04-16: qty 5

## 2016-04-20 ENCOUNTER — Other Ambulatory Visit: Payer: Self-pay | Admitting: Cardiology

## 2016-06-04 ENCOUNTER — Ambulatory Visit (HOSPITAL_BASED_OUTPATIENT_CLINIC_OR_DEPARTMENT_OTHER): Payer: Medicare Other

## 2016-06-04 DIAGNOSIS — Z452 Encounter for adjustment and management of vascular access device: Secondary | ICD-10-CM

## 2016-06-04 DIAGNOSIS — C3411 Malignant neoplasm of upper lobe, right bronchus or lung: Secondary | ICD-10-CM

## 2016-06-04 DIAGNOSIS — Z95828 Presence of other vascular implants and grafts: Secondary | ICD-10-CM

## 2016-06-04 MED ORDER — HEPARIN SOD (PORK) LOCK FLUSH 100 UNIT/ML IV SOLN
500.0000 [IU] | Freq: Once | INTRAVENOUS | Status: AC | PRN
  Administered 2016-06-04: 500 [IU] via INTRAVENOUS
  Filled 2016-06-04: qty 5

## 2016-06-04 MED ORDER — SODIUM CHLORIDE 0.9 % IJ SOLN
10.0000 mL | INTRAMUSCULAR | Status: DC | PRN
  Administered 2016-06-04: 10 mL via INTRAVENOUS
  Filled 2016-06-04: qty 10

## 2016-07-02 ENCOUNTER — Other Ambulatory Visit (HOSPITAL_BASED_OUTPATIENT_CLINIC_OR_DEPARTMENT_OTHER): Payer: Medicare Other

## 2016-07-02 ENCOUNTER — Ambulatory Visit (HOSPITAL_COMMUNITY)
Admission: RE | Admit: 2016-07-02 | Discharge: 2016-07-02 | Disposition: A | Discharge status: Home / Self Care | Payer: Medicare Other | Source: Ambulatory Visit | Attending: Internal Medicine | Admitting: Internal Medicine

## 2016-07-02 ENCOUNTER — Encounter (HOSPITAL_COMMUNITY): Payer: Self-pay

## 2016-07-02 DIAGNOSIS — J439 Emphysema, unspecified: Secondary | ICD-10-CM | POA: Insufficient documentation

## 2016-07-02 DIAGNOSIS — K5730 Diverticulosis of large intestine without perforation or abscess without bleeding: Secondary | ICD-10-CM | POA: Diagnosis not present

## 2016-07-02 DIAGNOSIS — I2510 Atherosclerotic heart disease of native coronary artery without angina pectoris: Secondary | ICD-10-CM | POA: Insufficient documentation

## 2016-07-02 DIAGNOSIS — J8410 Pulmonary fibrosis, unspecified: Secondary | ICD-10-CM | POA: Insufficient documentation

## 2016-07-02 DIAGNOSIS — C3411 Malignant neoplasm of upper lobe, right bronchus or lung: Secondary | ICD-10-CM | POA: Insufficient documentation

## 2016-07-02 DIAGNOSIS — M47814 Spondylosis without myelopathy or radiculopathy, thoracic region: Secondary | ICD-10-CM | POA: Insufficient documentation

## 2016-07-02 DIAGNOSIS — I700 Atherosclerosis of aorta: Secondary | ICD-10-CM | POA: Insufficient documentation

## 2016-07-02 DIAGNOSIS — I864 Gastric varices: Secondary | ICD-10-CM | POA: Insufficient documentation

## 2016-07-02 DIAGNOSIS — K7460 Unspecified cirrhosis of liver: Secondary | ICD-10-CM | POA: Diagnosis not present

## 2016-07-02 DIAGNOSIS — J432 Centrilobular emphysema: Secondary | ICD-10-CM | POA: Insufficient documentation

## 2016-07-02 LAB — COMPREHENSIVE METABOLIC PANEL
ALT: 38 U/L (ref 0–55)
AST: 49 U/L — AB (ref 5–34)
Albumin: 3.3 g/dL — ABNORMAL LOW (ref 3.5–5.0)
Alkaline Phosphatase: 206 U/L — ABNORMAL HIGH (ref 40–150)
Anion Gap: 6 mEq/L (ref 3–11)
BUN: 18.9 mg/dL (ref 7.0–26.0)
CHLORIDE: 109 meq/L (ref 98–109)
CO2: 26 meq/L (ref 22–29)
CREATININE: 1 mg/dL (ref 0.7–1.3)
Calcium: 9.3 mg/dL (ref 8.4–10.4)
EGFR: 76 mL/min/{1.73_m2} — ABNORMAL LOW (ref >90)
GLUCOSE: 96 mg/dL (ref 70–140)
POTASSIUM: 4.5 meq/L (ref 3.5–5.1)
SODIUM: 141 meq/L (ref 136–145)
Total Bilirubin: 1.57 mg/dL — ABNORMAL HIGH (ref 0.20–1.20)
Total Protein: 7.5 g/dL (ref 6.4–8.3)

## 2016-07-02 LAB — CBC WITH DIFFERENTIAL/PLATELET
BASO%: 0.5 % (ref 0.0–2.0)
Basophils Absolute: 0 10*3/uL (ref 0.0–0.1)
EOS%: 2.2 % (ref 0.0–7.0)
Eosinophils Absolute: 0.1 10*3/uL (ref 0.0–0.5)
HEMATOCRIT: 47.4 % (ref 38.4–49.9)
HGB: 15.8 g/dL (ref 13.0–17.1)
LYMPH#: 1.8 10*3/uL (ref 0.9–3.3)
LYMPH%: 30.5 % (ref 14.0–49.0)
MCH: 32.7 pg (ref 27.2–33.4)
MCHC: 33.4 g/dL (ref 32.0–36.0)
MCV: 98 fL (ref 79.3–98.0)
MONO#: 0.6 10*3/uL (ref 0.1–0.9)
MONO%: 10.2 % (ref 0.0–14.0)
NEUT#: 3.3 10*3/uL (ref 1.5–6.5)
NEUT%: 56.6 % (ref 39.0–75.0)
Platelets: 131 10*3/uL — ABNORMAL LOW (ref 140–400)
RBC: 4.84 10*6/uL (ref 4.20–5.82)
RDW: 14.6 % (ref 11.0–14.6)
WBC: 5.8 10*3/uL (ref 4.0–10.3)

## 2016-07-02 MED ORDER — IOPAMIDOL (ISOVUE-300) INJECTION 61%
75.0000 mL | Freq: Once | INTRAVENOUS | Status: AC | PRN
  Administered 2016-07-02: 75 mL via INTRAVENOUS

## 2016-07-06 ENCOUNTER — Encounter (INDEPENDENT_AMBULATORY_CARE_PROVIDER_SITE_OTHER): Payer: Self-pay | Admitting: Orthopaedic Surgery

## 2016-07-06 ENCOUNTER — Ambulatory Visit (INDEPENDENT_AMBULATORY_CARE_PROVIDER_SITE_OTHER): Payer: Medicare Other | Admitting: Orthopaedic Surgery

## 2016-07-06 ENCOUNTER — Ambulatory Visit (INDEPENDENT_AMBULATORY_CARE_PROVIDER_SITE_OTHER): Payer: Medicare Other

## 2016-07-06 VITALS — BP 114/73 | HR 76 | Resp 12 | Ht 72.0 in | Wt 205.0 lb

## 2016-07-06 DIAGNOSIS — G8929 Other chronic pain: Secondary | ICD-10-CM | POA: Insufficient documentation

## 2016-07-06 DIAGNOSIS — M25562 Pain in left knee: Secondary | ICD-10-CM

## 2016-07-06 NOTE — Progress Notes (Deleted)
Office Visit Note   Patient: Joseph [name]           Date of Birth: 1940-09-07           MRN: [id number] Visit Date: 07/06/2016              Requested by: [name], MD [address] [place], [place] [address] PCP: [name], MD   Assessment & Plan: Visit Diagnoses: No diagnosis found.  Plan: ***  Follow-Up Instructions: No Follow-up on file.   Orders:  No orders of the defined types were placed in this encounter.  No orders of the defined types were placed in this encounter.     Procedures: No procedures performed   Clinical Data: No additional findings.   Subjective: No chief complaint on file.   On 11/3 Friday, , pt was exercising on recumbunt bike and over extended left knee and it starting hurting. Next morning, severe pain in left knee, lateral and in back of knee. Left knee swollen  Pt ambulates with a cane, uses ice and ibuprofen, and aleve, some relief  Pt has had knee injections in the past and tolerated well.    Review of Systems  Constitutional: Negative.   HENT: Negative.   Eyes: Negative.   Respiratory: Positive for shortness of breath.   Cardiovascular: Negative.   Gastrointestinal: Negative.   Endocrine: Negative.   Genitourinary: Negative.   Musculoskeletal: Negative.   Skin: Negative.   Allergic/Immunologic: Negative.   Neurological: Negative.   Hematological: Negative.   Psychiatric/Behavioral: Negative.      Objective: Vital Signs: There were no vitals taken for this visit.  Physical Exam  Ortho Exam  Specialty Comments:  No specialty comments available.  Imaging: No results found.   PMFS History: Patient Active Problem List   Diagnosis Date Noted  . Port catheter in place 02/27/2016  . Claudication ([place]) 02/18/2016  . Statin intolerance 02/18/2016  . Essential hypertension 02/18/2016  . Coronary artery disease due to lipid rich plaque 08/29/2015  . Acute on chronic diastolic CHF (congestive heart  failure), NYHA class 2 ([place]) 08/29/2015  . 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 Hypoxemia requiring supplemental oxygen   . COPD (chronic obstructive pulmonary disease) ([place]) 07/06/2014  . Hypothyroidism 07/06/2014  . Numbness-Left Thigh 12/14/2013  . AAA- s/p Ao-Iliac BPG March 2015 10/20/2013  . Hyperlipidemia 10/09/2013  . Hepatic cirrhosis ([place]) 12/02/2012  . Cancer of right lung ([place]) 08/06/2011   Past Medical History:  Diagnosis Date  . Acute on chronic diastolic CHF (congestive heart failure), NYHA class 3 ([place]) 07/23/2014  . Arthritis    left knee and back arthrtis  . Atrial fibrillation with rapid ventricular response ([place]) 07/04/2014  . CAD (coronary artery disease)   . Cataract   . Cirrhosis ([place])    By radiography, seen on CT  . Diverticulosis   . Emphysema   . Enlarged prostate   . Heart murmur    teen  . History of colon polyps 2006,2008  . History of transfusion 2015  . Hx of radiation therapy 09/02/11 to 10/19/11   chest  . Hx of radiation therapy   . Hyperlipidemia    takes Pravastatin  . Hypothyroidism    takes Synthroid daily  . Lung cancer ([place])    right upper lobe  . Personal history of adenomatous colonic polyps 07/04/2012   2006, adenomatous right colon polyp removed, then ablated in 2007 followup, 2008, adenomatous  polyp of the right colon removed with hot biopsy forceps. All by Dr. Lajoyce Corners 01/03/2013 diminutive polyp ascending colon - adenoma    . Rosacea   . Shortness of breath    with exertion  . Small bowel obstruction 07/05/2014  . Status post chemotherapy    last chemo 2013  . Umbilical hernia, incarcerated- surgery 07/10/14 07/05/2014    Family History  Problem Relation Age of Onset  . Anesthesia problems Neg Hx   . Hypotension Neg Hx   . Malignant hyperthermia Neg Hx   . Pseudochol deficiency Neg Hx   . Colon cancer Neg Hx   . Cancer Father   . Diabetes Father   . Hyperlipidemia Brother     . AAA (abdominal aortic aneurysm) Brother   . Hypertension Brother   . Hyperlipidemia Mother   . Hypertension Mother   . Hyperlipidemia Sister   . Hypertension Sister     Past Surgical History:  Procedure Laterality Date  . ABDOMINAL AORTAGRAM  10/20/2013   Procedure: ABDOMINAL Maxcine Ham;  Surgeon: Elam Dutch, MD;  Location[facility] LAB;  Service: Cardiovascular;;  . ABDOMINAL AORTIC ANEURYSM REPAIR N/A 10/31/2013   Procedure: aorto to left external iliac and right external iliac and left internal iliac, reimplantation of inferior mesenteric artery and ligation of left iliac artery aneursym;  Surgeon: Elam Dutch, MD;  Location: [place];  Service: Vascular;  Laterality: N/A;  . ABDOMINAL WOUND DEHISCENCE N/A 11/10/2013   Procedure: ABDOMINAL WOUND CLOSURE ;  Surgeon: Serafina Mitchell, MD;  Location: [place];  Service: Vascular;  Laterality: N/A;  . bletharoplasty    . BOWEL RESECTION N/A 07/10/2014   Procedure: SMALL BOWEL RESECTION;  Surgeon: [name], MD;  Location: [place];  Service: General;  Laterality: N/A;  . CARDIAC CATHETERIZATION  2003  . COLONOSCOPY W/ BIOPSIES     multiple   . ESOPHAGOGASTRODUODENOSCOPY  2006  . FINGER SURGERY  2008   left pointer finger  . HERNIA REPAIR  70's  . INCISIONAL HERNIA REPAIR N/A 07/10/2014   Procedure: HERNIA REPAIR INCISIONAL;  Surgeon: [name], MD;  Location: [place];  Service: General;  Laterality: N/A;  . KNEE ARTHROSCOPY  2008   left knee  . LAPAROTOMY N/A 07/10/2014   Procedure: EXPLORATORY LAPAROTOMY;  Surgeon: [name], MD;  Location: [place];  Service: General;  Laterality: N/A;  . LOWER EXTREMITY ANGIOGRAM Bilateral 10/20/2013   Procedure: LOWER EXTREMITY ANGIOGRAM;  Surgeon: Elam Dutch, MD;  Location: [facility] CATH LAB;  Service: Cardiovascular;  Laterality: Bilateral;  . LYSIS OF ADHESION N/A 07/10/2014   Procedure: LYSIS OF ADHESION - 90 minutes;  Surgeon: [name], MD;  Location: [place];  Service: General;   Laterality: N/A;  . PORTACATH PLACEMENT  08/10/2011   Procedure: INSERTION PORT-A-CATH;  Surgeon: Pierre Bali, MD;  Location: [place];  Service: Thoracic;  Laterality: Left;  Left Subclavian  . PTOSIS REPAIR Bilateral 12-02-2015  . THROMBECTOMY ILIAC ARTERY Bilateral 10/31/2013   Procedure: THROMBECTOMY ILIAC ARTERY;  Surgeon: Elam Dutch, MD;  Location: [place];  Service: Vascular;  Laterality: Bilateral;  . TONSILLECTOMY     as a child  . VASECTOMY  70's  . VENTRAL HERNIA REPAIR N/A 04/04/2015   Procedure: OPEN REPAIR OF RECURRENT ABDOMINAL WALL HERNIA WITH MESH, COMPLEX LYSIS OF ADHESIONS X1 HR TAR COMPONENT SEPARATION;  Surgeon: Greer Pickerel, MD;  Location: [facility];  Service: General;  Laterality: N/A;   Social  History   Occupational History  . Not on file.   Social History Main Topics  . Smoking status: Former Smoker    Packs/day: 1.50    Years: 40.00    Types: Cigarettes    Quit date: 07/02/2011  . Smokeless tobacco: Former Systems developer  . Alcohol use 0.0 oz/week     Comment: social  . Drug use: No  . Sexual activity: Not on file

## 2016-07-06 NOTE — Progress Notes (Signed)
Office Visit Note   Patient: Joseph [name]           Date of Birth: 1940-10-11           MRN: [id number] Visit Date: 07/06/2016              Requested by: [name], MD [address] [place], [place] [address] PCP: [name], MD   Assessment & Plan: Visit Diagnoses:  1. Chronic pain of left knee     Plan: Intra-articular injection performed left knee anterolateral portal. He tolerated the injection well. He'll continue with a cane continue with ice  Follow-Up Instructions: No Follow-up on file. Follow-up when necessary  Orders:  Orders Placed This Encounter  Procedures  . XR KNEE 3 VIEW LEFT   No orders of the defined types were placed in this encounter.     Procedures: Large Joint Inj Date/Time: 07/06/2016 3:49 PM Performed by: Marybelle Killings Authorized by: Marybelle Killings   Consent Given by:  Patient Site marked: the procedure site was marked   Indications:  Pain and joint swelling Location:  Knee Site:  L knee Needle Size:  22 G Needle Length:  1.5 inches Approach:  Anterolateral Ultrasound Guidance: No   Fluoroscopic Guidance: No   Arthrogram: No Medications:  1 mL lidocaine 1 %; 40 mg methylPREDNISolone acetate 40 MG/ML; 0.66 mL bupivacaine 0.25 % Patient tolerance:  Patient tolerated the procedure well with no immediate complications     Clinical Data: No additional findings.   Subjective: Chief Complaint  Patient presents with  . Left Knee - Pain, Edema    On 11/3 pt was riding a recumbent bike and overextended left knee. Next morning pt had severe pain.  pt could not walk with pain lateral and back of left knee. Pt ambulated with a cane. No recent xrays of knee. Has had injections before, well tolerated       Previous injection was the 8-12 months ago. He has several months relief with the injection. Recently his knees hurt more laterally as well as popliteal region. Prior to that muscle strain was medial  Review of Systems    Constitutional: Negative for chills and diaphoresis.  HENT: Negative for ear discharge, ear pain and nosebleeds.   Eyes: Negative for discharge and visual disturbance.  Respiratory: Negative for cough, choking and shortness of breath.   Cardiovascular: Negative for chest pain and palpitations.  Gastrointestinal: Negative for abdominal distention and abdominal pain.  Endocrine: Negative for cold intolerance and heat intolerance.  Genitourinary: Negative for flank pain and hematuria.  Skin: Negative for rash and wound.  Neurological: Negative for seizures and speech difficulty.  Hematological: Negative for adenopathy. Does not bruise/bleed easily.  Psychiatric/Behavioral: Negative for agitation and suicidal ideas.   14 part review of systems updated and is unchanged as it pertains to his history of present illness   Objective: Vital Signs: BP 114/73 (BP Location: Right Arm)   Pulse 76   Resp 12   Ht 6' (1.829 m)   Wt 205 lb (93 kg)   BMI 27.80 kg/m   Physical Exam  Constitutional: He is oriented to person, place, and time. He appears well-developed and well-nourished.  HENT:  Head: Normocephalic and atraumatic.  Eyes: EOM are normal. Pupils are equal, round, and reactive to light.  Neck: No tracheal deviation present. No thyromegaly present.  Cardiovascular: Normal rate.   Pulmonary/Chest: Effort normal. He has no wheezes.  Abdominal: Soft. Bowel sounds are normal.  Neurological: He is alert and oriented to person, place, and time.  Skin: Skin is warm and dry. Capillary refill takes less than 2 seconds.  Psychiatric: He has a normal mood and affect. His behavior is normal. Judgment and thought content normal.    Ortho Exam patient is a mature with a cane there is mild swelling left knee. There is medial joint line tenderness. Some laxity and medial collateral ligament is processes mildly tender. Distal pulses intact no pain with hip range of motion. Quad strength is good there  is 15 varus clinically of his left knee right knee is straight. Wrists finger show no synovitis or joint deformity.  Specialty Comments:  No specialty comments available.  Imaging: Xr Knee 3 View Left  Result Date: 07/06/2016 Three-view x-rays of left knee demonstrate osteoarthritis most significant medial compartment with flattening regularity marginal osteophytes and subchondral sclerosis. 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 Chronic pain of left knee 07/06/2016  . Port catheter in place 02/27/2016  . Claudication ([place]) 02/18/2016  . Statin intolerance 02/18/2016  . Essential hypertension 02/18/2016  . Coronary artery disease due to lipid rich plaque 08/29/2015  . Acute on chronic diastolic CHF (congestive heart failure), NYHA class 2 ([place]) 08/29/2015  . 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 Hypoxemia requiring supplemental oxygen   . COPD (chronic obstructive pulmonary disease) ([place]) 07/06/2014  . Hypothyroidism 07/06/2014  . Numbness-Left Thigh 12/14/2013  . AAA- s/p Ao-Iliac BPG March 2015 10/20/2013  . Hyperlipidemia 10/09/2013  . Hepatic cirrhosis ([place]) 12/02/2012  . Cancer of right lung ([place]) 08/06/2011   Past Medical History:  Diagnosis Date  . Acute on chronic diastolic CHF (congestive heart failure), NYHA class 3 ([place]) 07/23/2014  . Arthritis    left knee and back arthrtis  . Atrial fibrillation with rapid ventricular response ([place]) 07/04/2014  . CAD (coronary artery disease)   . Cataract   . Cirrhosis ([place])    By radiography, seen on CT  . Diverticulosis   . Emphysema   . Enlarged prostate   . Heart murmur    teen  . History of colon polyps 2006,2008  . History of transfusion 2015  . Hx of radiation therapy 09/02/11 to 10/19/11   chest  . Hx of radiation therapy   . Hyperlipidemia     takes Pravastatin  . Hypothyroidism    takes Synthroid daily  . Lung cancer ([place])    right upper lobe  . Personal history of adenomatous colonic polyps 07/04/2012   2006, adenomatous right colon polyp removed, then ablated in 2007 followup, 2008, adenomatous polyp of the right colon removed with hot biopsy forceps. All by Dr. Lajoyce Corners 01/03/2013 diminutive polyp ascending colon - adenoma    . Rosacea   . Shortness of breath    with exertion  . Small bowel obstruction 07/05/2014  . Status post chemotherapy    last chemo 2013  . Umbilical hernia, incarcerated- surgery 07/10/14 07/05/2014    Family History  Problem Relation Age of Onset  . Anesthesia problems Neg Hx   . Hypotension Neg Hx   . Malignant hyperthermia Neg Hx   . Pseudochol deficiency Neg Hx   . Colon cancer Neg Hx   . Cancer Father   . Diabetes Father   . Hyperlipidemia Brother   . AAA (abdominal aortic aneurysm) Brother   .  Hypertension Brother   . Hyperlipidemia Mother   . Hypertension Mother   . Hyperlipidemia Sister   . Hypertension Sister     Past Surgical History:  Procedure Laterality Date  . ABDOMINAL AORTAGRAM  10/20/2013   Procedure: ABDOMINAL Maxcine Ham;  Surgeon: Elam Dutch, MD;  Location[facility] CATH LAB;  Service: Cardiovascular;;  . ABDOMINAL AORTIC ANEURYSM REPAIR N/A 10/31/2013   Procedure: aorto to left external iliac and right external iliac and left internal iliac, reimplantation of inferior mesenteric artery and ligation of left iliac artery aneursym;  Surgeon: Elam Dutch, MD;  Location: [place];  Service: Vascular;  Laterality: N/A;  . ABDOMINAL WOUND DEHISCENCE N/A 11/10/2013   Procedure: ABDOMINAL WOUND CLOSURE ;  Surgeon: Serafina Mitchell, MD;  Location: [place];  Service: Vascular;  Laterality: N/A;  . bletharoplasty    . BOWEL RESECTION N/A 07/10/2014   Procedure: SMALL BOWEL RESECTION;  Surgeon: [name], MD;  Location: [place];  Service: General;  Laterality: N/A;  . CARDIAC CATHETERIZATION   2003  . COLONOSCOPY W/ BIOPSIES     multiple   . ESOPHAGOGASTRODUODENOSCOPY  2006  . FINGER SURGERY  2008   left pointer finger  . HERNIA REPAIR  70's  . INCISIONAL HERNIA REPAIR N/A 07/10/2014   Procedure: HERNIA REPAIR INCISIONAL;  Surgeon: [name], MD;  Location: [place];  Service: General;  Laterality: N/A;  . KNEE ARTHROSCOPY  2008   left knee  . LAPAROTOMY N/A 07/10/2014   Procedure: EXPLORATORY LAPAROTOMY;  Surgeon: [name], MD;  Location: [place];  Service: General;  Laterality: N/A;  . LOWER EXTREMITY ANGIOGRAM Bilateral 10/20/2013   Procedure: LOWER EXTREMITY ANGIOGRAM;  Surgeon: Elam Dutch, MD;  Location: [facility] CATH LAB;  Service: Cardiovascular;  Laterality: Bilateral;  . LYSIS OF ADHESION N/A 07/10/2014   Procedure: LYSIS OF ADHESION - 90 minutes;  Surgeon: [name], MD;  Location: [place];  Service: General;  Laterality: N/A;  . PORTACATH PLACEMENT  08/10/2011   Procedure: INSERTION PORT-A-CATH;  Surgeon: Pierre Bali, MD;  Location: [place];  Service: Thoracic;  Laterality: Left;  Left Subclavian  . PTOSIS REPAIR Bilateral 12-02-2015  . THROMBECTOMY ILIAC ARTERY Bilateral 10/31/2013   Procedure: THROMBECTOMY ILIAC ARTERY;  Surgeon: Elam Dutch, MD;  Location: [facility];  Service: Vascular;  Laterality: Bilateral;  . TONSILLECTOMY     as a child  . VASECTOMY  70's  . VENTRAL HERNIA REPAIR N/A 04/04/2015   Procedure: OPEN REPAIR OF RECURRENT ABDOMINAL WALL HERNIA WITH MESH, COMPLEX LYSIS OF ADHESIONS X1 HR TAR COMPONENT SEPARATION;  Surgeon: Greer Pickerel, MD;  Location: [facility];  Service: General;  Laterality: N/A;   Social History   Occupational History  . Not on file.   Social History Main Topics  . Smoking status: Former Smoker    Packs/day: 1.50    Years: 40.00    Types: Cigarettes    Quit date: 07/02/2011  . Smokeless tobacco: Former Systems developer  . Alcohol use 0.0 oz/week     Comment: social  . Drug use: No  . Sexual activity: Not on file

## 2016-07-07 MED ORDER — BUPIVACAINE HCL 0.25 % IJ SOLN
0.6600 mL | INTRAMUSCULAR | Status: AC | PRN
  Administered 2016-07-06: .66 mL via INTRA_ARTICULAR

## 2016-07-07 MED ORDER — METHYLPREDNISOLONE ACETATE 40 MG/ML IJ SUSP
40.0000 mg | INTRAMUSCULAR | Status: AC | PRN
  Administered 2016-07-06: 40 mg via INTRA_ARTICULAR

## 2016-07-07 MED ORDER — LIDOCAINE HCL 1 % IJ SOLN
1.0000 mL | INTRAMUSCULAR | Status: AC | PRN
  Administered 2016-07-06: 1 mL

## 2016-07-09 ENCOUNTER — Ambulatory Visit (HOSPITAL_BASED_OUTPATIENT_CLINIC_OR_DEPARTMENT_OTHER): Payer: Medicare Other | Admitting: Internal Medicine

## 2016-07-09 ENCOUNTER — Encounter: Payer: Self-pay | Admitting: Internal Medicine

## 2016-07-09 ENCOUNTER — Telehealth: Payer: Self-pay | Admitting: Internal Medicine

## 2016-07-09 VITALS — BP 125/62 | HR 64 | Temp 98.5°F | Resp 17 | Wt 212.0 lb

## 2016-07-09 DIAGNOSIS — C3411 Malignant neoplasm of upper lobe, right bronchus or lung: Secondary | ICD-10-CM

## 2016-07-09 NOTE — Telephone Encounter (Signed)
Appointments scheduled per 11/9 LOS. Patient given AVS report and calendars with future scheduled appointments. Patient aware of CT scan in May.

## 2016-07-09 NOTE — Progress Notes (Signed)
[place] [phone]) [phone]   Fax:([phone]) [phone]  OFFICE PROGRESS NOTE  [name], MD [place] [id number]  DIAGNOSIS: Stage IIIA/IIIB non-small cell lung cancer   PRIOR THERAPY:  1) Concurrent chemoradiation with chemotherapy in the form of weekly carboplatin for an AUC of 2 and paclitaxel at 45 mg/m2.  2) Consolidation chemotherapy with carboplatin for AUC of 5 and paclitaxel 175 mg/M2 every 3 weeks with Neulasta support, status post 3 cycles, last cycle was given on 01/05/2012 with partial response.   CURRENT THERAPY: Observation.  INTERVAL HISTORY: Joseph [name] 75 y.o. male returns to the clinic today for followup visit. The patient is feeling fine today with no specific complaints. He has been observation since May 2013. He denied having any significant chest pain, shortness of breath, cough or hemoptysis. He denied having any significant weight loss or night sweats. He has left knee injury and he is expected to have left knee replacement in the next few months. The patient had repeat CT scan of the chest performed recently and he is here for evaluation and discussion of his scan results.  MEDICAL HISTORY: Past Medical History:  Diagnosis Date  . Acute on chronic diastolic CHF (congestive heart failure), NYHA class 3 ([place]) 07/23/2014  . Arthritis    left knee and back arthrtis  . Atrial fibrillation with rapid ventricular response ([place]) 07/04/2014  . CAD (coronary artery disease)   . Cataract   . Cirrhosis ([place])    By radiography, seen on CT  . Diverticulosis   . Emphysema   . Enlarged prostate   . Heart murmur    teen  . History of colon polyps 2006,2008  . History of transfusion 2015  . Hx of radiation therapy 09/02/11 to 10/19/11   chest  . Hx of radiation therapy   . Hyperlipidemia    takes Pravastatin  . Hypothyroidism    takes Synthroid daily  . Lung cancer ([place])    right upper lobe  . Personal history of  adenomatous colonic polyps 07/04/2012   2006, adenomatous right colon polyp removed, then ablated in 2007 followup, 2008, adenomatous polyp of the right colon removed with hot biopsy forceps. All by Dr. Lajoyce Corners 01/03/2013 diminutive polyp ascending colon - adenoma    . Rosacea   . Shortness of breath    with exertion  . Small bowel obstruction 07/05/2014  . Status post chemotherapy    last chemo 2013  . Umbilical hernia, incarcerated- surgery 07/10/14 07/05/2014    ALLERGIES:  has No Known Allergies.  MEDICATIONS:  Current Outpatient Prescriptions  Medication Sig Dispense Refill  . furosemide (LASIX) 20 MG tablet TAKE 1 TABLET BY MOUTH ONCE DAILY 90 tablet 2  . Hydrocodone-Acetaminophen 5-300 MG TABS     . L-ARGININE PO Take by mouth daily.    . Lactobacillus (DIGESTIVE HEALTH PROBIOTIC) CAPS Take 1 capsule by mouth daily. "30 million cultures"    . metoprolol tartrate (LOPRESSOR) 25 MG tablet TAKE 1 TABLET (25 MG TOTAL) BY MOUTH 2 (TWO) TIMES DAILY. 180 tablet 2  . Multiple Vitamins-Minerals (MULTIVITAMINS THER. W/MINERALS) TABS Take 1 tablet by mouth every morning.     . neomycin-polymyxin b-dexamethasone (MAXITROL) 3.5-10000-0.1 OINT     . nystatin-triamcinolone (MYCOLOG II) cream     . OVER THE COUNTER MEDICATION Take 2 tablets by mouth 2 (two) times daily. LUNG SUPPORT    . PROAIR HFA 108 (90 BASE) MCG/ACT inhaler USE 2 PUFFS  EVERY 6 HOURS AS NEEDED  6  . Specialty Vitamins Products (PROSTATE PO) Take 1 tablet by mouth 2 (two) times daily.    Marland Kitchen SYNTHROID 175 MCG tablet Take 175 mcg by mouth every morning.     . tiotropium (SPIRIVA) 18 MCG inhalation capsule Place 18 mcg into inhaler and inhale every morning.     . vitamin B-12 (CYANOCOBALAMIN) 1000 MCG tablet Take 1,000 mcg by mouth daily.     No current facility-administered medications for this visit.     SURGICAL HISTORY:  Past Surgical History:  Procedure Laterality Date  . ABDOMINAL AORTAGRAM  10/20/2013   Procedure: ABDOMINAL  Maxcine Ham;  Surgeon: Elam Dutch, MD;  Location[facility] CATH LAB;  Service: Cardiovascular;;  . ABDOMINAL AORTIC ANEURYSM REPAIR N/A 10/31/2013   Procedure: aorto to left external iliac and right external iliac and left internal iliac, reimplantation of inferior mesenteric artery and ligation of left iliac artery aneursym;  Surgeon: Elam Dutch, MD;  Location: [place];  Service: Vascular;  Laterality: N/A;  . ABDOMINAL WOUND DEHISCENCE N/A 11/10/2013   Procedure: ABDOMINAL WOUND CLOSURE ;  Surgeon: Serafina Mitchell, MD;  Location: [place];  Service: Vascular;  Laterality: N/A;  . bletharoplasty    . BOWEL RESECTION N/A 07/10/2014   Procedure: SMALL BOWEL RESECTION;  Surgeon: [name], MD;  Location: [place];  Service: General;  Laterality: N/A;  . CARDIAC CATHETERIZATION  2003  . COLONOSCOPY W/ BIOPSIES     multiple   . ESOPHAGOGASTRODUODENOSCOPY  2006  . FINGER SURGERY  2008   left pointer finger  . HERNIA REPAIR  70's  . INCISIONAL HERNIA REPAIR N/A 07/10/2014   Procedure: HERNIA REPAIR INCISIONAL;  Surgeon: [name], MD;  Location: [place];  Service: General;  Laterality: N/A;  . KNEE ARTHROSCOPY  2008   left knee  . LAPAROTOMY N/A 07/10/2014   Procedure: EXPLORATORY LAPAROTOMY;  Surgeon: [name], MD;  Location: [place];  Service: General;  Laterality: N/A;  . LOWER EXTREMITY ANGIOGRAM Bilateral 10/20/2013   Procedure: LOWER EXTREMITY ANGIOGRAM;  Surgeon: Elam Dutch, MD;  Location: [facility] CATH LAB;  Service: Cardiovascular;  Laterality: Bilateral;  . LYSIS OF ADHESION N/A 07/10/2014   Procedure: LYSIS OF ADHESION - 90 minutes;  Surgeon: [name], MD;  Location: [place];  Service: General;  Laterality: N/A;  . PORTACATH PLACEMENT  08/10/2011   Procedure: INSERTION PORT-A-CATH;  Surgeon: Pierre Bali, MD;  Location: [place];  Service: Thoracic;  Laterality: Left;  Left Subclavian  . PTOSIS REPAIR Bilateral 12-02-2015  . THROMBECTOMY ILIAC ARTERY Bilateral 10/31/2013    Procedure: THROMBECTOMY ILIAC ARTERY;  Surgeon: Elam Dutch, MD;  Location: [place];  Service: Vascular;  Laterality: Bilateral;  . TONSILLECTOMY     as a child  . VASECTOMY  70's  . VENTRAL HERNIA REPAIR N/A 04/04/2015   Procedure: OPEN REPAIR OF RECURRENT ABDOMINAL WALL HERNIA WITH MESH, COMPLEX LYSIS OF ADHESIONS X1 HR TAR COMPONENT SEPARATION;  Surgeon: Greer Pickerel, MD;  Location: [facility];  Service: General;  Laterality: N/A;    REVIEW OF SYSTEMS:  A comprehensive review of systems was negative except for: Musculoskeletal: positive for arthralgias   PHYSICAL EXAMINATION: General appearance: alert, cooperative and no distress Head: Normocephalic, without obvious abnormality, atraumatic Neck: no adenopathy Lymph nodes: Cervical, supraclavicular, and axillary nodes normal. Resp: clear to auscultation bilaterally Cardio: regular rate and rhythm, S1, S2 normal, no murmur, click, rub or gallop GI: soft,  non-tender; bowel sounds normal; no masses,  no organomegaly Extremities: extremities normal, atraumatic, no cyanosis or edema  ECOG PERFORMANCE STATUS: 1 - Symptomatic but completely ambulatory  Blood pressure 125/62, pulse 64, temperature 98.5 F (36.9 C), temperature source Oral, resp. rate 17, weight 212 lb (96.2 kg), SpO2 96 %.  LABORATORY DATA: Lab Results  Component Value Date   WBC 5.8 07/02/2016   HGB 15.8 07/02/2016   HCT 47.4 07/02/2016   MCV 98.0 07/02/2016   PLT 131 (L) 07/02/2016      Chemistry      Component Value Date/Time   NA 141 07/02/2016 1100   K 4.5 07/02/2016 1100   CL 106 04/08/2015 0341   CL 109 (H) 08/01/2012 1046   CO2 26 07/02/2016 1100   BUN 18.9 07/02/2016 1100   CREATININE 1.0 07/02/2016 1100      Component Value Date/Time   CALCIUM 9.3 07/02/2016 1100   ALKPHOS 206 (H) 07/02/2016 1100   AST 49 (H) 07/02/2016 1100   ALT 38 07/02/2016 1100   BILITOT 1.57 (H) 07/02/2016 1100       RADIOGRAPHIC STUDIES: Ct Chest W Contrast  Result  Date: 07/02/2016 CLINICAL DATA:  Right upper lobe non-small cell lung cancer diagnosed 2012 status post concurrent chemoradiation therapy and consolidation chemotherapy completed May 2013. Restaging. EXAM: CT CHEST WITH CONTRAST TECHNIQUE: Multidetector CT imaging of the chest was performed during intravenous contrast administration. CONTRAST:  60m ISOVUE-300 IOPAMIDOL (ISOVUE-300) INJECTION 61% COMPARISON:  01/07/2016 chest CT. FINDINGS: Cardiovascular: Normal heart size. No significant pericardial fluid/thickening. Left main, left anterior descending, left circumflex and right coronary atherosclerosis. Left subclavian MediPort terminates in the upper third of the superior vena cava. Atherosclerotic nonaneurysmal thoracic aorta. Normal caliber pulmonary arteries. No central pulmonary emboli. Mediastinum/Nodes: Thyroid is either atrophic or surgically absent. Unremarkable esophagus. No pathologically enlarged axillary, mediastinal or hilar lymph nodes. Lungs/Pleura: No pneumothorax. No pleural effusion. New nodular 9 mm focus in the dependent right upper tracheal lumen (series 4/ image 26), favor mucus. Severe centrilobular emphysema and diffuse bronchial wall thickening. Basilar right upper lobe 9 mm solid pulmonary nodule associated with the minor fissure (series 4/ image 78) is stable back to 07/20/2011 and considered benign. Thick parenchymal band in the medial right upper lobe with associated volume loss and distortion is stable and consistent with radiation fibrosis. No acute consolidative airspace disease or new significant pulmonary nodules. Mild hypoventilatory changes in the dependent lower lobes. Upper abdomen: Diffusely irregular liver surface, unchanged, consistent with cirrhosis. Diffuse colonic diverticulosis. Stable mild gastroesophageal varices. Musculoskeletal: No aggressive appearing focal osseous lesions. Moderate thoracic spondylosis. IMPRESSION: 1. 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 2. Probable retained mucus in the dependent right upper tracheal lumen, recommend attention on follow-up chest CT. 3. Additional findings include aortic atherosclerosis, left main and 3 vessel coronary atherosclerosis, severe emphysema, cirrhosis with mild gastroesophageal varices, and diffuse colonic diverticulosis. Electronically Signed   By: JIlona SorrelM.D.   On: 07/02/2016 15:26   Xr Knee 3 View Left  Result Date: 07/06/2016 Three-view x-rays of left knee demonstrate osteoarthritis most significant medial compartment with flattening regularity marginal osteophytes and subchondral sclerosis. 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 The patient is doing fine today with no specific complaints. The recent CT scan of the chest showed no significant evidence for disease progression.  I discussed the scan results with the patient and his wife.  I recommended for him to continue on observation with repeat CT scan of the chest in 6 months.  The patient was advised to call immediately if he has any concerning symptoms in the interval. All questions were answered. The patient knows to call the clinic with any problems, questions or concerns. We can certainly see the patient much sooner if necessary.  Disclaimer: This note was dictated with voice recognition software. Similar sounding words can inadvertently be transcribed and may not be corrected upon review.

## 2016-08-06 ENCOUNTER — Other Ambulatory Visit: Payer: Self-pay | Admitting: Cardiology

## 2016-08-11 ENCOUNTER — Ambulatory Visit (INDEPENDENT_AMBULATORY_CARE_PROVIDER_SITE_OTHER): Payer: Medicare Other | Admitting: Cardiology

## 2016-08-11 ENCOUNTER — Encounter: Payer: Self-pay | Admitting: Cardiology

## 2016-08-11 VITALS — BP 104/60 | HR 74 | Ht 72.0 in | Wt 212.8 lb

## 2016-08-11 DIAGNOSIS — I5033 Acute on chronic diastolic (congestive) heart failure: Secondary | ICD-10-CM | POA: Diagnosis not present

## 2016-08-11 DIAGNOSIS — I2510 Atherosclerotic heart disease of native coronary artery without angina pectoris: Secondary | ICD-10-CM

## 2016-08-11 DIAGNOSIS — I5032 Chronic diastolic (congestive) heart failure: Secondary | ICD-10-CM

## 2016-08-11 DIAGNOSIS — Z789 Other specified health status: Secondary | ICD-10-CM

## 2016-08-11 DIAGNOSIS — I2583 Coronary atherosclerosis due to lipid rich plaque: Secondary | ICD-10-CM

## 2016-08-11 DIAGNOSIS — I739 Peripheral vascular disease, unspecified: Secondary | ICD-10-CM | POA: Diagnosis not present

## 2016-08-11 DIAGNOSIS — I480 Paroxysmal atrial fibrillation: Secondary | ICD-10-CM

## 2016-08-11 DIAGNOSIS — I10 Essential (primary) hypertension: Secondary | ICD-10-CM

## 2016-08-11 MED ORDER — ASPIRIN EC 81 MG PO TBEC: 81.0000 mg | DELAYED_RELEASE_TABLET | Freq: Every day | ORAL | 3 refills | Status: DC

## 2016-08-11 NOTE — Patient Instructions (Addendum)
Medication Instructions:   START TAKING ASPIRIN 81 MG ONCE DAILY    Testing/Procedures:  Your physician has requested that you have a lower extremity arterial duplex. This test is an ultrasound of the arteries in the legs or arms. It looks at arterial blood flow in the legs and arms. Allow one hour for Lower and Upper Arterial scans. There are no restrictions or special instructions    Follow-Up:  Your physician wants you to follow-up in: [place] will receive a reminder letter in the mail two months in advance. If you don't receive a letter, please call our office to schedule the follow-up appointment.     If you need a refill on your cardiac medications before your next appointment, please call your pharmacy.

## 2016-08-11 NOTE — Progress Notes (Signed)
Patient ID: Joseph [name], male   DOB: [birth year], 75 y.o.   MRN: [id number]    Patient Name: Joseph [name]  Date of Encounter: 09/29/2013  Primary Care Provider: Janalyn Rouse, MD   Primary Cardiologist: Ena Dawley, H   Problem List   Reason for visit: main complain: follow up for CAD afib with RVR, SOB  Past Medical History   Diagnosis  Date   .  History of colon polyps  2006,2008   .  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  Hyperlipidemia      takes Pravastatin   .  Emphysema    .  Lung cancer      right upper lobe   .  Enlarged prostate      takes Rapaflo daily   .  Arthritis      left knee and back arthrtis   .  Hypothyroidism      takes Synthroid daily   .  Cataract    .  Hx of radiation therapy  09/02/11 to 10/19/11     chest   .  Diverticulosis    .  Cirrhosis      By radiography, seen on CT   .  Rosacea     Past Surgical History   Procedure  Laterality  Date   .  Hernia repair   70's   .  Vasectomy   70's   .  Finger surgery   2008     left pointer finger   .  Knee arthroscopy   2008     left knee   .  Tonsillectomy       as a child   .  Colonoscopy w/ biopsies       multiple   .  Cardiac catheterization   2003   .  Esophagogastroduodenoscopy   2006   .  Portacath placement   08/10/2011     Procedure: INSERTION PORT-A-CATH; Surgeon: Pierre Bali, MD; Location: [facility] OR; Service: Thoracic; Laterality: Left; Left Subclavian   Allergies  No Known Allergies    HPI  75 year old male with prior medical history of hyperlipidemia, known nonobstructive coronary artery disease was originally referred to [place] for preop evaluation prior to 4.4 cm AAA repair.  The patient states that approximately 10 years ago he was evaluated for coronary artery disease because of abnormal EKG that was followed by abnormal stress test and a cardiac catheterization that only showed minimal atherosclerotic plaque. The patient also has a history of lung cancer for which  she has been treated with chemotherapy and radiation therapy in 2012 and is currently in remission. The patient quit smoking 2 years ago and is being treated for COPD and emphysema with Spiriva.   On 10/31/2013 the patient underwent Aorto to left and right external iliac bypass, left internal iliac artery bypass, ligation right internal iliac aneurysm, reimplantation of inferior mesenteric artery. Thrombectomy of left and right iliac and femoral arteries. This was complicated with respiratory failure in addition to acute blood loss anemia and the patient received 6 units of FFP and 4 units of PRBCs in addition to 2 units of platelets during his admission. He was intubated and sedated for several days. He is feeling much better today and recovered very well. He denied having any significant chest pain, shortness of breath, cough or hemoptysis. He feels overall very tired and hasn't been back to preop baseline yet. He states that post surgery he retained  20 lbs of fluids but now is bak to baseline. Has some residual minimal LE edema.   He has known hyperlipidemia however he developed liver enzymes elevation after his chemotherapy and is being followed a GI specialist Dr. Charlott Holler, he has never been started on statins. He denies any alcohol intake.  07/23/2014 The patient was hospitalized with SBO, has had eval for both AAA progression and for an incarcerated incisional ventral hernia, s/p resection, mesh and wound vac placement. He was also diagnosed with new onset a-fib with RVR, started on amiodarone, no anticoagulation because of high risk of bleeding (recent surgery, ongoing incision wound, liver cirrhosis).  He was also started on home O2 therapy, coming for a follow up visit 5 days after discharge.  He feels weak, hypotensive at home, BP in 90', has not been taking lasix because of that. No palpitations. No chest pain, ongoing stable SOB after walking few steps. No PND, minimal LE edema.   08/29/2015  - the patient is coming after 6 months, he underwent hernia repair in July without complications, he had a stress test prior to the surgery with inferior wall infarct and mild peri-infarct ischemia.  He is currently asymptomatic, not compliant with aspirin, he is taking red yeast rice, livalo was discontinued as his Bili was elevated. LE edema has resolved. He denies DOE, no orthopnea, PND, chest pain.   02/18/2016 - the patient is coming after 6 months, he has been doing well, he underwent upper endoscopy, with no finding of his esophageal varices, however he LFTs have been elevated this and he is ready stress was discontinued. He was also taken off baby aspirin by his hepatologist. He denies any chest pain or shortness of breath, he does approximately mile and half a few times a week on his stationary bike at home. Denies any palpitations, complains of leg heaviness and claudications after walking short distances.  08/11/2016 - the patient is coming after 6 months, he denies any chest pain shortness of breath palpitations or syncope. He states that when he walks his leg feel heavy after walking about half a block. He's been compliant to his medicine, he has been followed by GI in his LFTs have improved. He denies lower extremity edema orthopnea or personal nocturnal dyspnea.  Home Medications    Current Outpatient Prescriptions:  .  furosemide (LASIX) 20 MG tablet, Take 1 tablet (20 mg total) by mouth daily., Disp: 90 tablet, Rfl: 1 .  Hydrocodone-Acetaminophen 5-300 MG TABS, , Disp: , Rfl:  .  ibuprofen (ADVIL,MOTRIN) 800 MG tablet, TAKE 1 TABLET BY MOUTH 3 TIMES A DAY AS NEEDED FOR PAIN, Disp: , Rfl: 0 .  L-ARGININE PO, Take by mouth daily., Disp: , Rfl:  .  Lactobacillus (DIGESTIVE HEALTH PROBIOTIC) CAPS, Take 1 capsule by mouth daily. "30 million cultures", Disp: , Rfl:  .  metoprolol tartrate (LOPRESSOR) 25 MG tablet, TAKE 1 TABLET (25 MG TOTAL) BY MOUTH 2 (TWO) TIMES DAILY., Disp: 180  tablet, Rfl: 2 .  Multiple Vitamins-Minerals (MULTIVITAMINS THER. W/MINERALS) TABS, Take 1 tablet by mouth every morning. , Disp: , Rfl:  .  neomycin-polymyxin b-dexamethasone (MAXITROL) 3.5-10000-0.1 OINT, , Disp: , Rfl:  .  nystatin-triamcinolone (MYCOLOG II) cream, , Disp: , Rfl:  .  OVER THE COUNTER MEDICATION, Take 2 tablets by mouth 2 (two) times daily. LUNG SUPPORT, Disp: , Rfl:  .  PROAIR HFA 108 (90 BASE) MCG/ACT inhaler, USE 2 PUFFS EVERY 6 HOURS AS NEEDED, Disp: , Rfl: 6 .  Specialty Vitamins Products (PROSTATE PO), Take 1 tablet by mouth 2 (two) times daily., Disp: , Rfl:  .  SYNTHROID 175 MCG tablet, Take 175 mcg by mouth every morning. , Disp: , Rfl:  .  tiotropium (SPIRIVA) 18 MCG inhalation capsule, Place 18 mcg into inhaler and inhale every morning. , Disp: , Rfl:  .  vitamin B-12 (CYANOCOBALAMIN) 1000 MCG tablet, Take 1,000 mcg by mouth daily., Disp: , Rfl:    Family History  Family History   Problem  Relation  Age of Onset   .  Anesthesia problems  Neg Hx    .  Hypotension  Neg Hx    .  Malignant hyperthermia  Neg Hx    .  Pseudochol deficiency  Neg Hx    .  Cancer  Father    .  Diabetes  Father    .  Hyperlipidemia  Brother    .  Aneurysm  Brother      Abdominal aortic aneurysm   .  Hyperlipidemia  Mother    .  Hyperlipidemia  Sister    Social History  History    Social History   .  Marital Status:  Married     Spouse Name:  N/A     Number of Children:  N/A   .  Years of Education:  N/A    Occupational History   .  Not on file.    Social History Main Topics   .  Smoking status:  Former Smoker -- 1.50 packs/day for 40 years     Types:  Cigarettes     Quit date:  07/02/2011   .  Smokeless tobacco:  Former Systems developer   .  Alcohol Use:  1.8 oz/week     3 Cans of beer per week      Comment: social   .  Drug Use:  No   .  Sexual Activity:  Not on file    Other Topics  Concern   .  Not on file    Social History Narrative   .  No narrative on file     Review of Systems, as per HPI, otherwise negative  General: No chills, fever, night sweats or weight changes.  Cardiovascular: No chest pain, dyspnea on exertion, edema, orthopnea, palpitations, paroxysmal nocturnal dyspnea.  Dermatological: No rash, lesions/masses  Respiratory: No cough, dyspnea  Urologic: No hematuria, dysuria  Abdominal: No nausea, vomiting, diarrhea, bright red blood per rectum, melena, or hematemesis  Neurologic: No visual changes, wkns, changes in mental status.  All other systems reviewed and are otherwise negative except as noted above.   Physical Exam  Blood pressure 106/62 mmHg, pulse 84, height 6' (1.829 m), weight 213 lb (100.699 kg).  General: Pleasant, NAD  Psych: Normal affect.  Neuro: Alert and oriented X 3. Moves all extremities spontaneously.  HEENT: Normal  Neck: Supple without bruits or JVD.  Lungs: Resp regular and unlabored, crackles at both bases  Heart: RRR no s3, s4, or murmurs.  Abdomen: Soft, non-tender, non-distended, BS + x 4.  Extremities: No clubbing, cyanosis, mild B/L pitting edema up to mid calves. Radials 2+ and equal bilaterally. DP/PT weak B/L  Labs:  No results found for this basename: CKTOTAL, CKMB, TROPONINI, in the last 72 hours  Lab Results   Component  Value  Date    WBC  5.1  07/17/2013    HGB  14.6  07/17/2013    HCT  43.2  07/17/2013  MCV  99.2*  07/17/2013    PLT  134*  07/17/2013    Accessory Clinical Findings   Echocardiogram - 10/12/2013 - Left ventricle: The cavity size was normal. Systolic function was normal. The estimated ejection fraction was in the range of 55% to 60%. Wall motion was normal; there were no regional wall motion abnormalities. There was an increased relative contribution of atrial contraction to ventricular filling. Doppler parameters are consistent with abnormal left ventricular relaxation (grade 1 diastolic dysfunction). - Aortic valve: Moderate diffuse thickening  and calcification, consistent with sclerosis. Trivial regurgitation. - Pulmonary arteries: PA peak pressure: 37m Hg (S). Impressions:  - The right ventricular systolic pressure was increased consistent with mild pulmonary hypertension  ECG - Done today 02/18/2016 shows sinus rhythm, normal ECG, unchanged from 01/22/2015.    Assessment & Plan   This is a 75year old male with a history of known nonobstructive CAD and HLP,  post Aorto to left and right external iliac bypass in 3/15, left internal iliac artery bypass, ligation right internal iliac aneurysm, reimplantation of inferior mesenteric artery. Thrombectomy of left and right iliac and femoral arteries. S/p SBO -> resection, incarcerated ventral hernia with repair , wound vac in place, PAF with RVR.  1. Non obstructive CAD - asymptomatic, no statins as prior liver enzymes elevation, now improved, aspirin was on hold because of history of esophageal varices, however the most recent EGD in May 2017 showed esophagus and stomach no evidence of bleeding. I will restart aspirin 81 mg daily, continue Lopressor 25 mg daily.  2. A-fib with RVR Maintaining NSR. Off amiodarone, on metoprolol 25 mg po BID,  Not a good anticoagulation candidate with bleeding risk and h/o cirrhosis  3. 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 He is euvolemic with no lower extremity edema on 20 mg of Lasix daily. Crea stable.   4. COPD - on home O2.   5. Hypothyroidism: started on synthroid  6. HLP - fatigue and muscle pain, elevated LFTs, now improved but not back to normal.  7. Claudications - we will order lower extremity arterial ultrasound.   Follow up in 6 months.    NDorothy Spark MD, [facility] 09/29/2013, 1:59 PM

## 2016-08-14 ENCOUNTER — Encounter (INDEPENDENT_AMBULATORY_CARE_PROVIDER_SITE_OTHER): Payer: Self-pay | Admitting: Orthopaedic Surgery

## 2016-08-14 ENCOUNTER — Ambulatory Visit (INDEPENDENT_AMBULATORY_CARE_PROVIDER_SITE_OTHER): Payer: Medicare Other | Admitting: Orthopaedic Surgery

## 2016-08-14 VITALS — BP 114/68 | HR 70 | Ht 72.0 in | Wt 205.0 lb

## 2016-08-14 DIAGNOSIS — G8929 Other chronic pain: Secondary | ICD-10-CM | POA: Diagnosis not present

## 2016-08-14 DIAGNOSIS — M25562 Pain in left knee: Secondary | ICD-10-CM | POA: Diagnosis not present

## 2016-08-14 NOTE — Progress Notes (Signed)
Office Visit Note   Patient: Joseph [name]           Date of Birth: 01-24-1941           MRN: [id number] Visit Date: 08/14/2016              Requested by: [name], MD [address] [place], [place] [address] PCP: [name], MD   Assessment & Plan: Visit Diagnoses: End-stage osteoarthritis left knee  Plan: Will plan left total knee replacement after medical clearance. Zander is had numerous cortisone injections in the past. He also has numerous medical issues that have precluded surgery previously  Follow-Up Instructions: No Follow-up on file.   Orders:  No orders of the defined types were placed in this encounter.  No orders of the defined types were placed in this encounter.     Procedures: No procedures performed   Clinical Data: No additional findings.   Subjective: No chief complaint on file.   Pt here today to talk about Left knee pain and possible surgery  Joseph [name] has a long history of bilateral knee pain. Recently she's had more trouble with the left knee with evidence of significant osteoarthritis by plain films. He's had a number of cortisone injections in the past including one by Dr. Lorin Mercy in my absence last month. The pain is reached the point where he has significant compromise of his activities. He presently notes that medically he stable and wishes to proceed with a knee replacement  Review of Systems   Objective: Vital Signs: There were no vitals taken for this visit.  Physical Exam  Ortho Exam left knee reveals no evidence of effusion there is increased varus with diffuse medial joint tenderness and some patellar crepitation. No instability. Slight loss of extension of the knee and overall 105 of flexion without instability. 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  Imaging: No results found.   PMFS History: Patient Active Problem List   Diagnosis Date Noted  . Chronic pain of left knee 07/06/2016  . Port catheter in place 02/27/2016  . Claudication ([place]) 02/18/2016  . Statin intolerance 02/18/2016  . Essential hypertension 02/18/2016  . Coronary artery disease due to lipid rich plaque 08/29/2015  . Acute on chronic diastolic CHF (congestive heart failure), NYHA class 2 ([place]) 08/29/2015  . 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 Hypoxemia requiring supplemental oxygen   . COPD (chronic obstructive pulmonary disease) ([place]) 07/06/2014  . Hypothyroidism 07/06/2014  . Numbness-Left Thigh 12/14/2013  . AAA- s/p Ao-Iliac BPG March 2015 10/20/2013  . Hyperlipidemia 10/09/2013  . Hepatic cirrhosis ([place]) 12/02/2012  . Cancer of right lung ([place]) 08/06/2011   Past Medical History:  Diagnosis Date  . Acute on chronic diastolic CHF (congestive heart failure), NYHA class 3 ([place]) 07/23/2014  . Arthritis    left knee and back arthrtis  . Atrial fibrillation with rapid ventricular response ([place]) 07/04/2014  . CAD (coronary artery disease)   . Cataract   . Cirrhosis ([place])    By radiography, seen on CT  . Diverticulosis   . Emphysema   . Enlarged prostate   . Heart murmur    teen  . History of colon polyps 2006,2008  . History of transfusion 2015  . Hx of radiation therapy 09/02/11 to 10/19/11   chest  . Hx  of radiation therapy   . Hyperlipidemia    takes Pravastatin  . Hypothyroidism    takes Synthroid daily  . Lung cancer ([place])    right upper lobe  . Personal history of adenomatous colonic polyps 07/04/2012   2006, adenomatous right colon polyp removed, then ablated in 2007 followup, 2008, adenomatous polyp of the right colon removed with hot biopsy forceps. All by Dr. Lajoyce Corners 01/03/2013 diminutive polyp ascending colon - adenoma    . Rosacea   . Shortness of breath    with exertion  .  Small bowel obstruction 07/05/2014  . Status post chemotherapy    last chemo 2013  . Umbilical hernia, incarcerated- surgery 07/10/14 07/05/2014    Family History  Problem Relation Age of Onset  . Cancer Father   . Diabetes Father   . Hyperlipidemia Mother   . Hypertension Mother   . Hyperlipidemia Brother   . AAA (abdominal aortic aneurysm) Brother   . Hypertension Brother   . Hyperlipidemia Sister   . Hypertension Sister   . Anesthesia problems Neg Hx   . Hypotension Neg Hx   . Malignant hyperthermia Neg Hx   . Pseudochol deficiency Neg Hx   . Colon cancer Neg Hx     Past Surgical History:  Procedure Laterality Date  . ABDOMINAL AORTAGRAM  10/20/2013   Procedure: ABDOMINAL Maxcine Ham;  Surgeon: Elam Dutch, MD;  Location: [facility] CATH LAB;  Service: Cardiovascular;;  . ABDOMINAL AORTIC ANEURYSM REPAIR N/A 10/31/2013   Procedure: aorto to left external iliac and right external iliac and left internal iliac, reimplantation of inferior mesenteric artery and ligation of left iliac artery aneursym;  Surgeon: Elam Dutch, MD;  Location: [place];  Service: Vascular;  Laterality: N/A;  . ABDOMINAL WOUND DEHISCENCE N/A 11/10/2013   Procedure: ABDOMINAL WOUND CLOSURE ;  Surgeon: Serafina Mitchell, MD;  Location: [facility];  Service: Vascular;  Laterality: N/A;  . bletharoplasty    . BOWEL RESECTION N/A 07/10/2014   Procedure: SMALL BOWEL RESECTION;  Surgeon: [name], MD;  Location: [place];  Service: General;  Laterality: N/A;  . CARDIAC CATHETERIZATION  2003  . COLONOSCOPY W/ BIOPSIES     multiple   . ESOPHAGOGASTRODUODENOSCOPY  2006  . FINGER SURGERY  2008   left pointer finger  . HERNIA REPAIR  70's  . INCISIONAL HERNIA REPAIR N/A 07/10/2014   Procedure: HERNIA REPAIR INCISIONAL;  Surgeon: [name], MD;  Location: [facility];  Service: General;  Laterality: N/A;  . KNEE ARTHROSCOPY  2008   left knee  . LAPAROTOMY N/A 07/10/2014   Procedure: EXPLORATORY LAPAROTOMY;  Surgeon: [name], MD;  Location: [place];  Service: General;  Laterality: N/A;  . LOWER EXTREMITY ANGIOGRAM Bilateral 10/20/2013   Procedure: LOWER EXTREMITY ANGIOGRAM;  Surgeon: Elam Dutch, MD;  Location: [facility] CATH LAB;  Service: Cardiovascular;  Laterality: Bilateral;  . LYSIS OF ADHESION N/A 07/10/2014   Procedure: LYSIS OF ADHESION - 90 minutes;  Surgeon: [name], MD;  Location: [facility];  Service: General;  Laterality: N/A;  . PORTACATH PLACEMENT  08/10/2011   Procedure: INSERTION PORT-A-CATH;  Surgeon: Pierre Bali, MD;  Location: [place];  Service: Thoracic;  Laterality: Left;  Left Subclavian  . PTOSIS REPAIR Bilateral 12-02-2015  . THROMBECTOMY ILIAC ARTERY Bilateral 10/31/2013   Procedure: THROMBECTOMY ILIAC ARTERY;  Surgeon: Elam Dutch, MD;  Location: [facility];  Service: Vascular;  Laterality: Bilateral;  . TONSILLECTOMY  as a child  . VASECTOMY  70's  . VENTRAL HERNIA REPAIR N/A 04/04/2015   Procedure: OPEN REPAIR OF RECURRENT ABDOMINAL WALL HERNIA WITH MESH, COMPLEX LYSIS OF ADHESIONS X1 HR TAR COMPONENT SEPARATION;  Surgeon: Greer Pickerel, MD;  Location: [facility]S;  Service: General;  Laterality: N/A;   Social History   Occupational History  . Not on file.   Social History Main Topics  . Smoking status: Former Smoker    Packs/day: 1.50    Years: 40.00    Types: Cigarettes    Quit date: 07/02/2011  . Smokeless tobacco: Former Systems developer  . Alcohol use 0.0 oz/week     Comment: social  . Drug use: No  . Sexual activity: Not on file

## 2016-09-08 ENCOUNTER — Telehealth: Payer: Self-pay | Admitting: Internal Medicine

## 2016-09-08 NOTE — Telephone Encounter (Signed)
PT CALLE TO GET FLUSH APPTS SCHEDULE. PT IS OVER DUE. GAVE PT NEW APPT DATE/TIME

## 2016-09-09 ENCOUNTER — Ambulatory Visit (HOSPITAL_BASED_OUTPATIENT_CLINIC_OR_DEPARTMENT_OTHER): Payer: PPO

## 2016-09-09 VITALS — BP 137/77 | HR 86 | Temp 97.7°F | Resp 16

## 2016-09-09 DIAGNOSIS — C3411 Malignant neoplasm of upper lobe, right bronchus or lung: Secondary | ICD-10-CM

## 2016-09-09 DIAGNOSIS — Z95828 Presence of other vascular implants and grafts: Secondary | ICD-10-CM

## 2016-09-09 DIAGNOSIS — Z452 Encounter for adjustment and management of vascular access device: Secondary | ICD-10-CM | POA: Diagnosis not present

## 2016-09-09 MED ORDER — SODIUM CHLORIDE 0.9 % IJ SOLN
10.0000 mL | INTRAMUSCULAR | Status: DC | PRN
  Administered 2016-09-09: 10 mL via INTRAVENOUS
  Filled 2016-09-09: qty 10

## 2016-09-09 MED ORDER — HEPARIN SOD (PORK) LOCK FLUSH 100 UNIT/ML IV SOLN
500.0000 [IU] | Freq: Once | INTRAVENOUS | Status: AC | PRN
  Administered 2016-09-09: 500 [IU] via INTRAVENOUS
  Filled 2016-09-09: qty 5

## 2016-09-09 NOTE — Patient Instructions (Signed)

## 2016-09-14 ENCOUNTER — Encounter (HOSPITAL_COMMUNITY): Payer: Medicare Other

## 2016-09-15 ENCOUNTER — Encounter (HOSPITAL_COMMUNITY): Payer: Medicare Other

## 2016-09-18 ENCOUNTER — Other Ambulatory Visit: Payer: Self-pay | Admitting: Cardiology

## 2016-09-18 DIAGNOSIS — I739 Peripheral vascular disease, unspecified: Secondary | ICD-10-CM

## 2016-09-23 ENCOUNTER — Ambulatory Visit (HOSPITAL_COMMUNITY)
Admission: RE | Admit: 2016-09-23 | Discharge: 2016-09-23 | Disposition: A | Discharge status: Home / Self Care | Payer: PPO | Source: Ambulatory Visit | Attending: Cardiovascular Disease | Admitting: Cardiovascular Disease

## 2016-09-23 DIAGNOSIS — I739 Peripheral vascular disease, unspecified: Secondary | ICD-10-CM

## 2016-10-19 DIAGNOSIS — K7460 Unspecified cirrhosis of liver: Secondary | ICD-10-CM | POA: Diagnosis not present

## 2016-10-19 DIAGNOSIS — Z8719 Personal history of other diseases of the digestive system: Secondary | ICD-10-CM | POA: Diagnosis not present

## 2016-10-19 DIAGNOSIS — Z6828 Body mass index (BMI) 28.0-28.9, adult: Secondary | ICD-10-CM | POA: Diagnosis not present

## 2016-10-19 DIAGNOSIS — R10817 Generalized abdominal tenderness: Secondary | ICD-10-CM | POA: Diagnosis not present

## 2016-10-19 DIAGNOSIS — I10 Essential (primary) hypertension: Secondary | ICD-10-CM | POA: Diagnosis not present

## 2016-10-19 DIAGNOSIS — Z85118 Personal history of other malignant neoplasm of bronchus and lung: Secondary | ICD-10-CM | POA: Diagnosis not present

## 2016-10-20 ENCOUNTER — Emergency Department (HOSPITAL_COMMUNITY): Payer: PPO

## 2016-10-20 ENCOUNTER — Inpatient Hospital Stay (HOSPITAL_COMMUNITY)
Admission: EM | Admit: 2016-10-20 | Discharge: 2016-10-25 | DRG: 388 | Disposition: A | Discharge status: Home / Self Care | Payer: PPO | Attending: Internal Medicine | Admitting: Internal Medicine

## 2016-10-20 ENCOUNTER — Encounter (HOSPITAL_COMMUNITY): Payer: Self-pay | Admitting: Radiology

## 2016-10-20 DIAGNOSIS — I4891 Unspecified atrial fibrillation: Secondary | ICD-10-CM

## 2016-10-20 DIAGNOSIS — E039 Hypothyroidism, unspecified: Secondary | ICD-10-CM | POA: Diagnosis not present

## 2016-10-20 DIAGNOSIS — I5032 Chronic diastolic (congestive) heart failure: Secondary | ICD-10-CM | POA: Diagnosis present

## 2016-10-20 DIAGNOSIS — R748 Abnormal levels of other serum enzymes: Secondary | ICD-10-CM | POA: Diagnosis not present

## 2016-10-20 DIAGNOSIS — Z8249 Family history of ischemic heart disease and other diseases of the circulatory system: Secondary | ICD-10-CM | POA: Diagnosis not present

## 2016-10-20 DIAGNOSIS — Z9049 Acquired absence of other specified parts of digestive tract: Secondary | ICD-10-CM | POA: Diagnosis not present

## 2016-10-20 DIAGNOSIS — I2583 Coronary atherosclerosis due to lipid rich plaque: Secondary | ICD-10-CM | POA: Diagnosis not present

## 2016-10-20 DIAGNOSIS — R0902 Hypoxemia: Secondary | ICD-10-CM | POA: Diagnosis not present

## 2016-10-20 DIAGNOSIS — J9601 Acute respiratory failure with hypoxia: Secondary | ICD-10-CM | POA: Diagnosis not present

## 2016-10-20 DIAGNOSIS — Z833 Family history of diabetes mellitus: Secondary | ICD-10-CM

## 2016-10-20 DIAGNOSIS — Z87891 Personal history of nicotine dependence: Secondary | ICD-10-CM

## 2016-10-20 DIAGNOSIS — J9600 Acute respiratory failure, unspecified whether with hypoxia or hypercapnia: Secondary | ICD-10-CM | POA: Diagnosis not present

## 2016-10-20 DIAGNOSIS — K7460 Unspecified cirrhosis of liver: Secondary | ICD-10-CM | POA: Diagnosis present

## 2016-10-20 DIAGNOSIS — E785 Hyperlipidemia, unspecified: Secondary | ICD-10-CM | POA: Diagnosis not present

## 2016-10-20 DIAGNOSIS — Z7982 Long term (current) use of aspirin: Secondary | ICD-10-CM

## 2016-10-20 DIAGNOSIS — Z85118 Personal history of other malignant neoplasm of bronchus and lung: Secondary | ICD-10-CM | POA: Diagnosis not present

## 2016-10-20 DIAGNOSIS — Z7951 Long term (current) use of inhaled steroids: Secondary | ICD-10-CM

## 2016-10-20 DIAGNOSIS — Z79899 Other long term (current) drug therapy: Secondary | ICD-10-CM

## 2016-10-20 DIAGNOSIS — K565 Intestinal adhesions [bands], unspecified as to partial versus complete obstruction: Secondary | ICD-10-CM | POA: Diagnosis not present

## 2016-10-20 DIAGNOSIS — R109 Unspecified abdominal pain: Secondary | ICD-10-CM | POA: Diagnosis not present

## 2016-10-20 DIAGNOSIS — R790 Abnormal level of blood mineral: Secondary | ICD-10-CM | POA: Diagnosis not present

## 2016-10-20 DIAGNOSIS — R140 Abdominal distension (gaseous): Secondary | ICD-10-CM | POA: Diagnosis not present

## 2016-10-20 DIAGNOSIS — E876 Hypokalemia: Secondary | ICD-10-CM | POA: Diagnosis not present

## 2016-10-20 DIAGNOSIS — R0602 Shortness of breath: Secondary | ICD-10-CM | POA: Diagnosis not present

## 2016-10-20 DIAGNOSIS — I739 Peripheral vascular disease, unspecified: Secondary | ICD-10-CM | POA: Diagnosis not present

## 2016-10-20 DIAGNOSIS — I110 Hypertensive heart disease with heart failure: Secondary | ICD-10-CM | POA: Diagnosis present

## 2016-10-20 DIAGNOSIS — J449 Chronic obstructive pulmonary disease, unspecified: Secondary | ICD-10-CM | POA: Diagnosis present

## 2016-10-20 DIAGNOSIS — E860 Dehydration: Secondary | ICD-10-CM | POA: Diagnosis not present

## 2016-10-20 DIAGNOSIS — N400 Enlarged prostate without lower urinary tract symptoms: Secondary | ICD-10-CM | POA: Diagnosis not present

## 2016-10-20 DIAGNOSIS — R7989 Other specified abnormal findings of blood chemistry: Secondary | ICD-10-CM

## 2016-10-20 DIAGNOSIS — I480 Paroxysmal atrial fibrillation: Secondary | ICD-10-CM | POA: Diagnosis present

## 2016-10-20 DIAGNOSIS — Z923 Personal history of irradiation: Secondary | ICD-10-CM

## 2016-10-20 DIAGNOSIS — I10 Essential (primary) hypertension: Secondary | ICD-10-CM | POA: Diagnosis present

## 2016-10-20 DIAGNOSIS — Z9221 Personal history of antineoplastic chemotherapy: Secondary | ICD-10-CM | POA: Diagnosis not present

## 2016-10-20 DIAGNOSIS — K56609 Unspecified intestinal obstruction, unspecified as to partial versus complete obstruction: Secondary | ICD-10-CM | POA: Diagnosis not present

## 2016-10-20 DIAGNOSIS — I2510 Atherosclerotic heart disease of native coronary artery without angina pectoris: Secondary | ICD-10-CM | POA: Diagnosis not present

## 2016-10-20 DIAGNOSIS — N179 Acute kidney failure, unspecified: Secondary | ICD-10-CM | POA: Diagnosis not present

## 2016-10-20 DIAGNOSIS — K2970 Gastritis, unspecified, without bleeding: Secondary | ICD-10-CM | POA: Diagnosis not present

## 2016-10-20 DIAGNOSIS — Z0189 Encounter for other specified special examinations: Secondary | ICD-10-CM

## 2016-10-20 DIAGNOSIS — R000 Tachycardia, unspecified: Secondary | ICD-10-CM

## 2016-10-20 DIAGNOSIS — J438 Other emphysema: Secondary | ICD-10-CM | POA: Diagnosis not present

## 2016-10-20 DIAGNOSIS — I2699 Other pulmonary embolism without acute cor pulmonale: Secondary | ICD-10-CM

## 2016-10-20 DIAGNOSIS — R112 Nausea with vomiting, unspecified: Secondary | ICD-10-CM | POA: Diagnosis not present

## 2016-10-20 DIAGNOSIS — K56600 Partial intestinal obstruction, unspecified as to cause: Secondary | ICD-10-CM | POA: Diagnosis not present

## 2016-10-20 LAB — CBC WITH DIFFERENTIAL/PLATELET
BASOS ABS: 0 10*3/uL (ref 0.0–0.1)
Basophils Relative: 0 %
EOS PCT: 0 %
Eosinophils Absolute: 0 10*3/uL (ref 0.0–0.7)
HCT: 48.4 % (ref 39.0–52.0)
Hemoglobin: 17.3 g/dL — ABNORMAL HIGH (ref 13.0–17.0)
Lymphocytes Relative: 24 %
Lymphs Abs: 1.3 10*3/uL (ref 0.7–4.0)
MCH: 34.1 pg — ABNORMAL HIGH (ref 26.0–34.0)
MCHC: 35.7 g/dL (ref 30.0–36.0)
MCV: 95.3 fL (ref 78.0–100.0)
MONO ABS: 1.1 10*3/uL — AB (ref 0.1–1.0)
MONOS PCT: 20 %
Neutro Abs: 2.9 10*3/uL (ref 1.7–7.7)
Neutrophils Relative %: 56 %
PLATELETS: 120 10*3/uL — AB (ref 150–400)
RBC: 5.08 MIL/uL (ref 4.22–5.81)
RDW: 13.7 % (ref 11.5–15.5)
WBC: 5.3 10*3/uL (ref 4.0–10.5)

## 2016-10-20 LAB — COMPREHENSIVE METABOLIC PANEL
ALBUMIN: 3.8 g/dL (ref 3.5–5.0)
ALK PHOS: 114 U/L (ref 38–126)
ALT: 30 U/L (ref 17–63)
AST: 44 U/L — AB (ref 15–41)
Anion gap: 12 (ref 5–15)
BILIRUBIN TOTAL: 3 mg/dL — AB (ref 0.3–1.2)
BUN: 27 mg/dL — AB (ref 6–20)
CALCIUM: 10.1 mg/dL (ref 8.9–10.3)
CO2: 23 mmol/L (ref 22–32)
Chloride: 101 mmol/L (ref 101–111)
Creatinine, Ser: 1.33 mg/dL — ABNORMAL HIGH (ref 0.61–1.24)
GFR calc Af Amer: 60 mL/min — ABNORMAL LOW (ref >60)
GFR calc non Af Amer: 51 mL/min — ABNORMAL LOW (ref >60)
GLUCOSE: 129 mg/dL — AB (ref 65–99)
POTASSIUM: 4.5 mmol/L (ref 3.5–5.1)
Sodium: 136 mmol/L (ref 135–145)
Total Protein: 7.5 g/dL (ref 6.5–8.1)

## 2016-10-20 LAB — D-DIMER, QUANTITATIVE: D-Dimer, Quant: 3.99 ug/mL-FEU — ABNORMAL HIGH (ref 0.00–0.50)

## 2016-10-20 LAB — URINALYSIS, ROUTINE W REFLEX MICROSCOPIC
BILIRUBIN URINE: NEGATIVE
Glucose, UA: NEGATIVE mg/dL
Ketones, ur: NEGATIVE mg/dL
LEUKOCYTES UA: NEGATIVE
Nitrite: NEGATIVE
PH: 5 (ref 5.0–8.0)
Protein, ur: NEGATIVE mg/dL

## 2016-10-20 MED ORDER — IOPAMIDOL (ISOVUE-300) INJECTION 61%
30.0000 mL | Freq: Once | INTRAVENOUS | Status: AC
  Administered 2016-10-20: 30 mL via ORAL

## 2016-10-20 MED ORDER — ALBUTEROL SULFATE (2.5 MG/3ML) 0.083% IN NEBU: 2.5000 mg | INHALATION_SOLUTION | RESPIRATORY_TRACT | Status: DC | PRN

## 2016-10-20 MED ORDER — IPRATROPIUM-ALBUTEROL 0.5-2.5 (3) MG/3ML IN SOLN: 3.0000 mL | Freq: Three times a day (TID) | RESPIRATORY_TRACT | Status: DC

## 2016-10-20 MED ORDER — DIATRIZOATE MEGLUMINE & SODIUM 66-10 % PO SOLN: 90.0000 mL | Freq: Once | ORAL | Status: DC

## 2016-10-20 MED ORDER — ACETAMINOPHEN 650 MG RE SUPP: 650.0000 mg | Freq: Four times a day (QID) | RECTAL | Status: DC | PRN

## 2016-10-20 MED ORDER — ONDANSETRON HCL 4 MG PO TABS: 4.0000 mg | ORAL_TABLET | Freq: Four times a day (QID) | ORAL | Status: DC | PRN

## 2016-10-20 MED ORDER — ENOXAPARIN SODIUM 40 MG/0.4ML ~~LOC~~ SOLN
40.0000 mg | SUBCUTANEOUS | Status: DC
  Administered 2016-10-20 – 2016-10-24 (×5): 40 mg via SUBCUTANEOUS
  Filled 2016-10-20 (×5): qty 0.4

## 2016-10-20 MED ORDER — IPRATROPIUM-ALBUTEROL 0.5-2.5 (3) MG/3ML IN SOLN
3.0000 mL | Freq: Three times a day (TID) | RESPIRATORY_TRACT | Status: DC
  Administered 2016-10-20: 3 mL via RESPIRATORY_TRACT
  Filled 2016-10-20 (×2): qty 3

## 2016-10-20 MED ORDER — SODIUM CHLORIDE 0.9% FLUSH: 3.0000 mL | Freq: Two times a day (BID) | INTRAVENOUS | Status: DC

## 2016-10-20 MED ORDER — ACETAMINOPHEN 325 MG PO TABS
650.0000 mg | ORAL_TABLET | Freq: Four times a day (QID) | ORAL | Status: DC | PRN
  Administered 2016-10-23: 650 mg via ORAL
  Filled 2016-10-20: qty 2

## 2016-10-20 MED ORDER — IOPAMIDOL (ISOVUE-300) INJECTION 61%
INTRAVENOUS | Status: AC
  Filled 2016-10-20: qty 30

## 2016-10-20 MED ORDER — ONDANSETRON HCL 4 MG/2ML IJ SOLN
4.0000 mg | Freq: Once | INTRAMUSCULAR | Status: AC
  Administered 2016-10-20: 4 mg via INTRAVENOUS
  Filled 2016-10-20: qty 2

## 2016-10-20 MED ORDER — LEVOTHYROXINE SODIUM 100 MCG IV SOLR
87.5000 ug | Freq: Every day | INTRAVENOUS | Status: DC
  Administered 2016-10-20 – 2016-10-23 (×4): 87.5 ug via INTRAVENOUS
  Filled 2016-10-20 (×4): qty 5

## 2016-10-20 MED ORDER — SODIUM CHLORIDE 0.9 % IJ SOLN
INTRAMUSCULAR | Status: AC
  Filled 2016-10-20: qty 50

## 2016-10-20 MED ORDER — SODIUM CHLORIDE 0.9 % IV SOLN
INTRAVENOUS | Status: DC
  Filled 2016-10-20: qty 1000

## 2016-10-20 MED ORDER — SODIUM CHLORIDE 0.9 % IV BOLUS (SEPSIS)
500.0000 mL | Freq: Once | INTRAVENOUS | Status: AC
  Administered 2016-10-20: 500 mL via INTRAVENOUS

## 2016-10-20 MED ORDER — IOPAMIDOL (ISOVUE-300) INJECTION 61%
INTRAVENOUS | Status: AC
  Administered 2016-10-20: 100 mL
  Filled 2016-10-20: qty 100

## 2016-10-20 MED ORDER — ONDANSETRON HCL 4 MG/2ML IJ SOLN
4.0000 mg | Freq: Four times a day (QID) | INTRAMUSCULAR | Status: DC | PRN
  Administered 2016-10-20 – 2016-10-21 (×2): 4 mg via INTRAVENOUS
  Filled 2016-10-20 (×2): qty 2

## 2016-10-20 MED ORDER — METOPROLOL TARTRATE 5 MG/5ML IV SOLN
2.5000 mg | Freq: Four times a day (QID) | INTRAVENOUS | Status: DC
  Administered 2016-10-20 – 2016-10-21 (×3): 2.5 mg via INTRAVENOUS
  Filled 2016-10-20 (×3): qty 5

## 2016-10-20 MED ORDER — SODIUM CHLORIDE 0.9 % IV SOLN
INTRAVENOUS | Status: DC
  Administered 2016-10-20 – 2016-10-23 (×5): via INTRAVENOUS

## 2016-10-20 MED ORDER — LIDOCAINE HCL 2 % EX GEL
1.0000 "application " | Freq: Once | CUTANEOUS | Status: AC
  Administered 2016-10-20: 1
  Filled 2016-10-20: qty 11

## 2016-10-20 NOTE — ED Notes (Signed)
Bed: WA17 Expected date:  Expected time:  Means of arrival:  Comments: EMS  Possible bowel obstruction

## 2016-10-20 NOTE — H&P (Signed)
History and Physical  Joseph [name]:[id number] DOB: 05/17/1941 DOA: 10/20/2016   PCP: [name], MD   Patient coming from: Home  Chief Complaint: Nausea, vomiting, abdominal pain  HPI:  Joseph [name] is a 76 y.o. male with medical history of COPD, paroxysmal atrial fibrillation, coronary artery disease, non-small cell lung cancer, and previous small bowel obstruction presents with 3 day history of abdominal pain and distention and one day of nausea, vomiting. The patient states that he has been trying to increase his roughage intake for the past week. He states that he had a bowel movement the morning of admission at home. He has had no bowel movement for 3 days prior to this mornings bowel movement. He has been unable to keep down any fluids, and has been unable to take his medications for the last 24 hours. The patient had a previous history of a small bowel obstruction in 2015 requiring partial bowel resection and lysis of adhesions. He denies any fevers, chills, chest pain, dysuria, hematuria, coughing, hemoptysis. He has been a little bit more short of breath for the past 3 days without any chest pain. Denies any coughing, hemoptysis, worsening lower extremity edema. He denies any orthopnea or PND. He quit smoking in 2012 after approximately 70-pack-year history  In the emergency department, the patient was noted to be hypoxemic with oxygen saturation of 89% on room air. He was stable with oxygen saturation 94-95 percent on 3 L. CT of the abdomen and pelvis showed small bowel structure with a transition zone in the central abdomen and suggestion of cirrhosis; gallbladder which was moderately distended without evidence for wall thickening, pericholecystic inflammation, no biliary dilatation.  CBC was unremarkable except for platelets 120,000. BMP shows serum creatinine 1.33. Hepatic enzymes showed bilirubin of 0.0, AST 44, ALT 30, alkaline phosphatase is 114. Gen. surgery  was consulted by ED.   Assessment/Plan: Small bowel obstruction -The patient has had a number of intra-abdominal surgeries in the past included ventral hernia repair and AAA repair -Appreciate general surgery consult -Remain npo -IV fluids -NG tube per general surgery  Dehydration/AKI -baseline creatinine 0.8-1.0 -Presenting creatinine 1.33  Acute respiratory failure with hypoxia -Likely resulting from hypoventilation from his abdominal distention as well as underlying COPD -Start bronchodilators -Presently stable on 3 L nasal cannula -Check d-dimer -Chest x-ray shows chronic interstitial changes without any edema or consolidation  Atrial fibrillation -Presently in sinus rhythm -Starting IV metoprolol until able to take po -Not a good anticoagulation candidate with bleeding risk and h/o cirrhosis  Chronic diastolic CHF -Appears dehydrated clinically -Holding furosemide -Daily weights  Hypertension -Converting po to IV metoprolol  COPD -Patient is not on home oxygen -Start bronchodilators  Nonobstructive CAD -No chest pain presently -Obtain EKG  Non-small cell lung cancer stage IIIa/IIIB -Presently in remission -Follows Dr. Julien Nordmann         Past Medical History:  Diagnosis Date  . Acute on chronic diastolic CHF (congestive heart failure), NYHA class 3 ([place]) 07/23/2014  . Arthritis    left knee and back arthrtis  . Atrial fibrillation with rapid ventricular response ([place]) 07/04/2014  . CAD (coronary artery disease)   . Cataract   . Cirrhosis ([place])    By radiography, seen on CT  . Diverticulosis   . Emphysema   . Enlarged prostate   . Heart murmur    teen  . History of colon polyps 2006,2008  . History of transfusion 2015  . Hx of  radiation therapy 09/02/11 to 10/19/11   chest  . Hx of radiation therapy   . Hyperlipidemia    takes Pravastatin  . Hypothyroidism    takes Synthroid daily  . Lung cancer ([place])    right upper lobe  . Personal history  of adenomatous colonic polyps 07/04/2012   2006, adenomatous right colon polyp removed, then ablated in 2007 followup, 2008, adenomatous polyp of the right colon removed with hot biopsy forceps. All by Dr. Lajoyce Corners 01/03/2013 diminutive polyp ascending colon - adenoma    . Rosacea   . Shortness of breath    with exertion  . Small bowel obstruction 07/05/2014  . Status post chemotherapy    last chemo 2013  . Umbilical hernia, incarcerated- surgery 07/10/14 07/05/2014   Past Surgical History:  Procedure Laterality Date  . ABDOMINAL AORTAGRAM  10/20/2013   Procedure: ABDOMINAL Maxcine Ham;  Surgeon: Elam Dutch, MD;  Location: [facility] CATH LAB;  Service: Cardiovascular;;  . ABDOMINAL AORTIC ANEURYSM REPAIR N/A 10/31/2013   Procedure: aorto to left external iliac and right external iliac and left internal iliac, reimplantation of inferior mesenteric artery and ligation of left iliac artery aneursym;  Surgeon: Elam Dutch, MD;  Location: [place];  Service: Vascular;  Laterality: N/A;  . ABDOMINAL WOUND DEHISCENCE N/A 11/10/2013   Procedure: ABDOMINAL WOUND CLOSURE ;  Surgeon: Serafina Mitchell, MD;  Location: [place];  Service: Vascular;  Laterality: N/A;  . bletharoplasty    . BOWEL RESECTION N/A 07/10/2014   Procedure: SMALL BOWEL RESECTION;  Surgeon: [name], MD;  Location: [place];  Service: General;  Laterality: N/A;  . CARDIAC CATHETERIZATION  2003  . COLONOSCOPY W/ BIOPSIES     multiple   . ESOPHAGOGASTRODUODENOSCOPY  2006  . FINGER SURGERY  2008   left pointer finger  . HERNIA REPAIR  70's  . INCISIONAL HERNIA REPAIR N/A 07/10/2014   Procedure: HERNIA REPAIR INCISIONAL;  Surgeon: [name], MD;  Location: [place];  Service: General;  Laterality: N/A;  . KNEE ARTHROSCOPY  2008   left knee  . LAPAROTOMY N/A 07/10/2014   Procedure: EXPLORATORY LAPAROTOMY;  Surgeon: [name], MD;  Location: [place];  Service: General;  Laterality: N/A;  . LOWER EXTREMITY ANGIOGRAM Bilateral 10/20/2013    Procedure: LOWER EXTREMITY ANGIOGRAM;  Surgeon: Elam Dutch, MD;  Location: [facility] CATH LAB;  Service: Cardiovascular;  Laterality: Bilateral;  . LYSIS OF ADHESION N/A 07/10/2014   Procedure: LYSIS OF ADHESION - 90 minutes;  Surgeon: [name], MD;  Location: [place];  Service: General;  Laterality: N/A;  . PORTACATH PLACEMENT  08/10/2011   Procedure: INSERTION PORT-A-CATH;  Surgeon: Pierre Bali, MD;  Location: [place];  Service: Thoracic;  Laterality: Left;  Left Subclavian  . PTOSIS REPAIR Bilateral 12-02-2015  . THROMBECTOMY ILIAC ARTERY Bilateral 10/31/2013   Procedure: THROMBECTOMY ILIAC ARTERY;  Surgeon: Elam Dutch, MD;  Location: [place];  Service: Vascular;  Laterality: Bilateral;  . TONSILLECTOMY     as a child  . VASECTOMY  70's  . VENTRAL HERNIA REPAIR N/A 04/04/2015   Procedure: OPEN REPAIR OF RECURRENT ABDOMINAL WALL HERNIA WITH MESH, COMPLEX LYSIS OF ADHESIONS X1 HR TAR COMPONENT SEPARATION;  Surgeon: Greer Pickerel, MD;  Location: [facility];  Service: General;  Laterality: N/A;   Social History:  reports that he quit smoking about 5 years ago. His smoking use included Cigarettes. He has a 60.00 pack-year smoking history. He has quit using smokeless  tobacco. He reports that he drinks alcohol. He reports that he does not use drugs.   Family History  Problem Relation Age of Onset  . Cancer Father   . Diabetes Father   . Hyperlipidemia Mother   . Hypertension Mother   . Hyperlipidemia Brother   . AAA (abdominal aortic aneurysm) Brother   . Hypertension Brother   . Hyperlipidemia Sister   . Hypertension Sister   . Anesthesia problems Neg Hx   . Hypotension Neg Hx   . Malignant hyperthermia Neg Hx   . Pseudochol deficiency Neg Hx   . Colon cancer Neg Hx      No Known Allergies   Prior to Admission medications   Medication Sig Start Date End Date Taking? Authorizing Provider  acidophilus (RISAQUAD) CAPS capsule Take 1 capsule by mouth daily.   Yes Historical Provider,  MD  albuterol (PROVENTIL HFA;VENTOLIN HFA) 108 (90 Base) MCG/ACT inhaler Inhale 1-2 puffs into the lungs every 6 (six) hours as needed for wheezing or shortness of breath.   Yes Historical Provider, MD  aspirin EC 81 MG tablet Take 1 tablet (81 mg total) by mouth daily. 08/11/16  Yes Dorothy Spark, MD  furosemide (LASIX) 20 MG tablet Take 1 tablet (20 mg total) by mouth daily. 08/06/16  Yes Dorothy Spark, MD  HYDROcodone-acetaminophen (NORCO/VICODIN) 5-325 MG tablet Take 1 tablet by mouth every 4 (four) hours as needed for moderate pain.   Yes Historical Provider, MD  L-ARGININE PO Take 1 tablet by mouth 2 (two) times daily.    Yes Historical Provider, MD  metoprolol tartrate (LOPRESSOR) 25 MG tablet Take 25 mg by mouth 2 (two) times daily.   Yes Historical Provider, MD  Multiple Vitamin (MULTIVITAMIN WITH MINERALS) TABS tablet Take 1 tablet by mouth daily.   Yes Historical Provider, MD  nystatin-triamcinolone (MYCOLOG II) cream Apply 1 application topically 2 (two) times daily as needed (for itching).    Yes Historical Provider, MD  OVER THE COUNTER MEDICATION Take 2 tablets by mouth 2 (two) times daily. Medication:  Lung Support   Yes Historical Provider, MD  Specialty Vitamins Products (PROSTATE PO) Take 1 tablet by mouth 2 (two) times daily.   Yes Historical Provider, MD  SYNTHROID 175 MCG tablet Take 175 mcg by mouth daily before breakfast.    Yes Stephens Shire, MD  tiotropium (SPIRIVA) 18 MCG inhalation capsule Place 18 mcg into inhaler and inhale daily.    Yes Historical Provider, MD  vitamin B-12 (CYANOCOBALAMIN) 1000 MCG tablet Take 1,000 mcg by mouth daily.   Yes Historical Provider, MD    Review of Systems:  Constitutional:  No weight loss, night sweats, Fevers, chills, fatigue.  Head&Eyes: No headache.  No vision loss.  No eye pain or scotoma ENT:  No Difficulty swallowing,Tooth/dental problems,Sore throat,  No ear ache, post nasal drip,  Cardio-vascular:  No chest pain,  Orthopnea, PND, swelling in lower extremities,  dizziness, palpitations  GI:  No  diarrhea, loss of appetite, hematochezia, melena, heartburn, indigestion, Resp:  No  No cough. No coughing up of blood .No wheezing.No chest wall deformity  Skin:  no rash or lesions.  GU:  no dysuria, change in color of urine, no urgency or frequency. No flank pain.  Musculoskeletal:  No joint pain or swelling. No decreased range of motion. No back pain.  Psych:  No change in mood or affect. No depression or anxiety. Neurologic: No headache, no dysesthesia, no focal weakness, no vision loss. No  syncope  Physical Exam: Vitals:   10/20/16 0726 10/20/16 0735 10/20/16 1212  BP:   100/77  Pulse:  (!) 125 119  Resp:  20 14  Temp:  98.3 F (36.8 C)   TempSrc:  Oral   SpO2: 97% (!) 89% 92%   General:  A&O x 3, NAD, nontoxic, pleasant/cooperative Head/Eye: No conjunctival hemorrhage, no icterus, /AT, No nystagmus ENT:  No icterus,  No thrush, good dentition, no pharyngeal exudate Neck:  No masses, no lymphadenpathy, no bruits CV:  RRR, no rub, no gallop, no S3 Lung:  Diminished breath sounds with basilar crackles. No wheezing. Abdomen: soft/NT, +BS, mildly distended, no peritoneal signs Ext: No cyanosis, No rashes, No petechiae, No lymphangitis, No edema Neuro: CNII-XII intact, strength 4/5 in bilateral upper and lower extremities, no dysmetria  Labs on Admission:  Basic Metabolic Panel:  Recent Labs Lab 10/20/16 0852  NA 136  K 4.5  CL 101  CO2 23  GLUCOSE 129*  BUN 27*  CREATININE 1.33*  CALCIUM 10.1   Liver Function Tests:  Recent Labs Lab 10/20/16 0852  AST 44*  ALT 30  ALKPHOS 114  BILITOT 3.0*  PROT 7.5  ALBUMIN 3.8   No results for input(s): LIPASE, AMYLASE in the last 168 hours. No results for input(s): AMMONIA in the last 168 hours. CBC:  Recent Labs Lab 10/20/16 0852  WBC 5.3  NEUTROABS 2.9  HGB 17.3*  HCT 48.4  MCV 95.3  PLT 120*   Coagulation  Profile: No results for input(s): INR, PROTIME in the last 168 hours. Cardiac Enzymes: No results for input(s): CKTOTAL, CKMB, CKMBINDEX, TROPONINI in the last 168 hours. BNP: Invalid input(s): POCBNP CBG: No results for input(s): GLUCAP in the last 168 hours. Urine analysis:    Component Value Date/Time   COLORURINE AMBER (A) 07/15/2014 1101   APPEARANCEUR CLEAR 07/15/2014 1101   LABSPEC 1.022 07/15/2014 1101   PHURINE 7.0 07/15/2014 1101   GLUCOSEU NEGATIVE 07/15/2014 1101   HGBUR NEGATIVE 07/15/2014 1101   BILIRUBINUR SMALL (A) 07/15/2014 1101   KETONESUR NEGATIVE 07/15/2014 1101   PROTEINUR NEGATIVE 07/15/2014 1101   UROBILINOGEN 2.0 (H) 07/15/2014 1101   NITRITE NEGATIVE 07/15/2014 1101   LEUKOCYTESUR TRACE (A) 07/15/2014 1101   Sepsis Labs: '@LABRCNTIP'$ (procalcitonin:4,lacticidven:4) )No results found for this or any previous visit (from the past 240 hour(s)).   Radiological Exams on Admission: Dg Chest 2 View  Result Date: 10/20/2016 CLINICAL DATA:  Mid and upper abdominal pain associated with nausea and vomiting and abdominal distension. Symptoms increasing over the past 4 days. Also shortness of breath and hypoxia. History of right lung malignancy, COPD, former smoker. EXAM: CHEST  2 VIEW COMPARISON:  CT scan of the chest of July 02, 2016 and portable chest x-ray of April 04, 2015. FINDINGS: The lungs are adequately inflated. The interstitial markings remain increased in the right mid and lower lung and in the left mid lung. There is no alveolar infiltrate or pleural effusion. The heart and pulmonary vascularity are normal. There is calcification in the wall of the aortic arch. The porta catheter tip projects over the proximal SVC. There is multilevel degenerative disc disease of the thoracic spine. The gas pattern in the upper abdomen is unremarkable. IMPRESSION: Chronic interstitial changes bilaterally. Those on the right are likely due to previous radiation therapy. On the  left may may reflect scarring. Bullous lesions in the apices. No pneumonia nor CHF. Thoracic aortic atherosclerosis. Given the patient's abdominal symptoms, supine and upright abdominal radiographs  may be useful. Electronically Signed   By: Yomayra Tate  [place] M.D.   On: 10/20/2016 09:50   Ct Abdomen Pelvis W Contrast  Result Date: 10/20/2016 CLINICAL DATA:  Abdominal pain and distention. History of obstruction. History of lung cancer, hernia repair, and abdominal aortic aneurysm repair. EXAM: CT ABDOMEN AND PELVIS WITH CONTRAST TECHNIQUE: Multidetector CT imaging of the abdomen and pelvis was performed using the standard protocol following bolus administration of intravenous contrast. CONTRAST:  100 mL Isovue-300 COMPARISON:  01/13/2016 FINDINGS: Lower chest: Centrilobular emphysema and subsegmental atelectasis in the lung bases. No pleural effusion. Three-vessel coronary artery atherosclerosis. Normal heart size. Hepatobiliary: Morphologic changes suggestive of cirrhosis are again noted including a nodular liver contour and enlargement of the lateral segment of the left hepatic lobe. No focal liver abnormality is identified. A 6 mm stone is again noted in the gallbladder which is moderately distended but without evidence of wall thickening or pericholecystic inflammation. No biliary dilatation. Pancreas: Unremarkable. Spleen: No focal abnormality. Decreased splenic volume since the prior study. Adrenals/Urinary Tract: Unremarkable adrenal glands. No evidence of renal mass, calculi, or hydronephrosis. Unremarkable bladder. Stomach/Bowel: Moderate distention of the stomach by fluid and gas. There is moderate diffuse proximal small bowel dilatation with loops measuring up to 5 cm in diameter and containing multiple air-fluid levels. There is a transition point in the central abdomen at the site of a small bowel anastomosis (series 2, image 66) with more distal small bowel being decompressed. There is diffuse colonic  diverticulosis without evidence of diverticulitis. The appendix is unremarkable. Vascular/Lymphatic: Moderately advanced atherosclerosis involving the abdominal aorta and its major branch vessels. Prior infrarenal abdominal aortic aneurysm repair. The aorta measures up to 3.3 cm in AP diameter, unchanged. 10 mm portacaval and 9 mm periportal/peripancreatic lymph nodes are unchanged and likely reactive due to chronic liver disease. Reproductive: Mildly enlarged prostate. Other: Unchanged small fat containing right inguinal hernia. Trace perihepatic fluid. Musculoskeletal: Bilateral L5 pars defects with grade 1 anterolisthesis, unchanged. Multilevel lumbar disc degeneration, greatest at L2-3. IMPRESSION: 1. Small bowel obstruction with transition point in the central abdomen at a small bowel anastomosis. 2. Aortic atherosclerosis.  Prior abdominal aortic aneurysm repair. 3. Morphologic changes of cirrhosis.  Trace perihepatic fluid. 4. Cholelithiasis. Electronically Signed   By: Logan Bores M.D.   On: 10/20/2016 10:37    EKG: Independently reviewed. pending    Time spent:60 minutes Code Status:   FULL Family Communication:  Spouse updated at bedside Disposition Plan: expect 2-3 day hospitalization Consults called: general surgery DVT Prophylaxis: [place] Lovenox/Heparin   SCDs  Randye Treichler, DO  [facility] Pager [phone]  If 7PM-7AM, please contact night-coverage [email or web address] Password [email or web address] 10/20/2016, 1:00 PM

## 2016-10-20 NOTE — ED Notes (Signed)
Pt is not able to urinate.

## 2016-10-20 NOTE — Consult Note (Signed)
Reason for Consult:  SBO Referring Physician: Dr.  Roxine Caddy  Joseph [name] is an 76 y.o. male.  HPI: Patient presents from home with 3 days of nausea and vomiting. He was seen by GI yesterday they were concerned he had a small bowel obstruction. He presented to the ED this a.m. he reports a small watery bowel movement this a.m. with an enema. No BM for 3 days prior to that. He's been unable to eat or drink for the last 3 days.  History of abdominal aortic aneurysm repair with dehiscence and repeat surgery 10/2013.  He has a history of prior bowel obstruction in 2015 that required operative repair, partial bowel resection and lysis of adhesions.  In 2016, he had repair of a ventral hernia with mesh. This was done by Dr. Redmond Pulling.  Workup in the ED shows O2 sat of 89% he placed on oxygen. O2 sats are better on oxygen. He is afebrile but tachycardic. Labs show electrolytes are stable creatinine is 1.33, glucose 129, total bilirubin is 3.0. WBC is 5.3, hemoglobin 17.3 hematocrit 48.4. Platelets 120,000. Chest x-ray on admission shows chronic interstitial changes bilaterally once on the right are due to previous radiation therapy on the left there may be scarring, bullous lesions in the apices no pneumonia or CHF.  CT scan shows moderate distention the stomach there is diffuse proximal small bowel dilatation with loops measuring up to 5 cm in diameter containing multiple air-fluid levels. Transition point in the central abdomen at the site of a small bowel anastomosis the more distal small bowel is decompressed. Diffuse colonic diverticulosis without diverticulitis the appendix was unremarkable. Prior abdominal aortic aneurysm repair. Morphologic changes suggestive of cirrhosis including nodular liver contour enlargement of the lateral segment left hepatic lobe 6 mm stone is noted in the gallbladder which was moderately distended without evidence for wall thickening, pericholecystic inflammation, no  biliary dilatation.  We are asked to see in consult.  Past Medical History:  Diagnosis Date  . Acute on chronic diastolic CHF (congestive heart failure), NYHA class 3 ([place]) 07/23/2014  . Arthritis    left knee and back arthrtis  . Atrial fibrillation with rapid ventricular response ([place]) 07/04/2014  . CAD (coronary artery disease)   . Cataract   . Cirrhosis ([place])    By radiography, seen on CT  . Diverticulosis   . Emphysema   . Enlarged prostate   . Heart murmur    teen  . History of colon polyps 2006,2008  . History of transfusion 2015  . Hx of radiation therapy 09/02/11 to 10/19/11   chest  . Hx of radiation therapy   . Hyperlipidemia    takes Pravastatin  . Hypothyroidism    takes Synthroid daily  . Lung cancer ([place])    right upper lobe  . Personal history of adenomatous colonic polyps 07/04/2012   2006, adenomatous right colon polyp removed, then ablated in 2007 followup, 2008, adenomatous polyp of the right colon removed with hot biopsy forceps. All by Dr. Lajoyce Corners 01/03/2013 diminutive polyp ascending colon - adenoma    . Rosacea   . Shortness of breath    with exertion  . Small bowel obstruction 07/05/2014  . Status post chemotherapy    last chemo 2013  . Umbilical hernia, incarcerated- surgery 07/10/14 07/05/2014    Past Surgical History:  Procedure Laterality Date  . ABDOMINAL AORTAGRAM  10/20/2013   Procedure: ABDOMINAL Maxcine Ham;  Surgeon: Elam Dutch, MD;  Location: [facility] CATH LAB;  Service: Cardiovascular;;  . ABDOMINAL AORTIC ANEURYSM REPAIR N/A 10/31/2013   Procedure: aorto to left external iliac and right external iliac and left internal iliac, reimplantation of inferior mesenteric artery and ligation of left iliac artery aneursym;  Surgeon: Elam Dutch, MD;  Location: [place];  Service: Vascular;  Laterality: N/A;  . ABDOMINAL WOUND DEHISCENCE N/A 11/10/2013   Procedure: ABDOMINAL WOUND CLOSURE ;  Surgeon: Serafina Mitchell, MD;  Location: [place];  Service: Vascular;   Laterality: N/A;  . bletharoplasty    . BOWEL RESECTION N/A 07/10/2014   Procedure: SMALL BOWEL RESECTION;  Surgeon: [name], MD;  Location: [place];  Service: General;  Laterality: N/A;  . CARDIAC CATHETERIZATION  2003  . COLONOSCOPY W/ BIOPSIES     multiple   . ESOPHAGOGASTRODUODENOSCOPY  2006  . FINGER SURGERY  2008   left pointer finger  . HERNIA REPAIR  70's  . INCISIONAL HERNIA REPAIR N/A 07/10/2014   Procedure: HERNIA REPAIR INCISIONAL;  Surgeon: [name], MD;  Location: [place];  Service: General;  Laterality: N/A;  . KNEE ARTHROSCOPY  2008   left knee  . LAPAROTOMY N/A 07/10/2014   Procedure: EXPLORATORY LAPAROTOMY;  Surgeon: [name], MD;  Location: [place];  Service: General;  Laterality: N/A;  . LOWER EXTREMITY ANGIOGRAM Bilateral 10/20/2013   Procedure: LOWER EXTREMITY ANGIOGRAM;  Surgeon: Elam Dutch, MD;  Location: [facility] CATH LAB;  Service: Cardiovascular;  Laterality: Bilateral;  . LYSIS OF ADHESION N/A 07/10/2014   Procedure: LYSIS OF ADHESION - 90 minutes;  Surgeon: [name], MD;  Location: [place];  Service: General;  Laterality: N/A;  . PORTACATH PLACEMENT  08/10/2011   Procedure: INSERTION PORT-A-CATH;  Surgeon: Pierre Bali, MD;  Location: [place];  Service: Thoracic;  Laterality: Left;  Left Subclavian  . PTOSIS REPAIR Bilateral 12-02-2015  . THROMBECTOMY ILIAC ARTERY Bilateral 10/31/2013   Procedure: THROMBECTOMY ILIAC ARTERY;  Surgeon: Elam Dutch, MD;  Location: [place];  Service: Vascular;  Laterality: Bilateral;  . TONSILLECTOMY     as a child  . VASECTOMY  70's  . VENTRAL HERNIA REPAIR N/A 04/04/2015   Procedure: OPEN REPAIR OF RECURRENT ABDOMINAL WALL HERNIA WITH MESH, COMPLEX LYSIS OF ADHESIONS X1 HR TAR COMPONENT SEPARATION;  Surgeon: Greer Pickerel, MD;  Location: [facility];  Service: General;  Laterality: N/A;    Family History  Problem Relation Age of Onset  . Cancer Father   . Diabetes Father   . Hyperlipidemia Mother   . Hypertension  Mother   . Hyperlipidemia Brother   . AAA (abdominal aortic aneurysm) Brother   . Hypertension Brother   . Hyperlipidemia Sister   . Hypertension Sister   . Anesthesia problems Neg Hx   . Hypotension Neg Hx   . Malignant hyperthermia Neg Hx   . Pseudochol deficiency Neg Hx   . Colon cancer Neg Hx     Social History:  reports that he quit smoking about 5 years ago. His smoking use included Cigarettes. He has a 60.00 pack-year smoking history. He has quit using smokeless tobacco. He reports that he drinks alcohol. He reports that he does not use drugs.  Allergies: No Known Allergies  Prior to Admission medications   Medication Sig Start Date End Date Taking? Authorizing Provider  acidophilus (RISAQUAD) CAPS capsule Take 1 capsule by mouth daily.   Yes Historical Provider, MD  albuterol (PROVENTIL HFA;VENTOLIN HFA) 108 (90 Base) MCG/ACT inhaler Inhale 1-2 puffs into  the lungs every 6 (six) hours as needed for wheezing or shortness of breath.   Yes Historical Provider, MD  aspirin EC 81 MG tablet Take 1 tablet (81 mg total) by mouth daily. 08/11/16  Yes Dorothy Spark, MD  furosemide (LASIX) 20 MG tablet Take 1 tablet (20 mg total) by mouth daily. 08/06/16  Yes Dorothy Spark, MD  HYDROcodone-acetaminophen (NORCO/VICODIN) 5-325 MG tablet Take 1 tablet by mouth every 4 (four) hours as needed for moderate pain.   Yes Historical Provider, MD  L-ARGININE PO Take 1 tablet by mouth 2 (two) times daily.    Yes Historical Provider, MD  metoprolol tartrate (LOPRESSOR) 25 MG tablet Take 25 mg by mouth 2 (two) times daily.   Yes Historical Provider, MD  Multiple Vitamin (MULTIVITAMIN WITH MINERALS) TABS tablet Take 1 tablet by mouth daily.   Yes Historical Provider, MD  nystatin-triamcinolone (MYCOLOG II) cream Apply 1 application topically 2 (two) times daily as needed (for itching).    Yes Historical Provider, MD  OVER THE COUNTER MEDICATION Take 2 tablets by mouth 2 (two) times daily.  Medication:  Lung Support   Yes Historical Provider, MD  Specialty Vitamins Products (PROSTATE PO) Take 1 tablet by mouth 2 (two) times daily.   Yes Historical Provider, MD  SYNTHROID 175 MCG tablet Take 175 mcg by mouth daily before breakfast.    Yes Stephens Shire, MD  tiotropium (SPIRIVA) 18 MCG inhalation capsule Place 18 mcg into inhaler and inhale daily.    Yes Historical Provider, MD  vitamin B-12 (CYANOCOBALAMIN) 1000 MCG tablet Take 1,000 mcg by mouth daily.   Yes Historical Provider, MD    Results for orders placed or performed during the hospital encounter of 10/20/16 (from the past 48 hour(s))  Comprehensive metabolic panel     Status: Abnormal   Collection Time: 10/20/16  8:52 AM  Result Value Ref Range   Sodium 136 135 - 145 mmol/L   Potassium 4.5 3.5 - 5.1 mmol/L   Chloride 101 101 - 111 mmol/L   CO2 23 22 - 32 mmol/L   Glucose, Bld 129 (H) 65 - 99 mg/dL   BUN 27 (H) 6 - 20 mg/dL   Creatinine, Ser 1.33 (H) 0.61 - 1.24 mg/dL   Calcium 10.1 8.9 - 10.3 mg/dL   Total Protein 7.5 6.5 - 8.1 g/dL   Albumin 3.8 3.5 - 5.0 g/dL   AST 44 (H) 15 - 41 U/L   ALT 30 17 - 63 U/L   Alkaline Phosphatase 114 38 - 126 U/L   Total Bilirubin 3.0 (H) 0.3 - 1.2 mg/dL   GFR calc non Af Amer 51 (L) >60 mL/min   GFR calc Af Amer 60 (L) >60 mL/min    Comment: (NOTE) The eGFR has been calculated using the CKD EPI equation. This calculation has not been validated in all clinical situations. eGFR's persistently <60 mL/min signify possible Chronic Kidney Disease.    Anion gap 12 5 - 15  CBC with Differential     Status: Abnormal   Collection Time: 10/20/16  8:52 AM  Result Value Ref Range   WBC 5.3 4.0 - 10.5 K/uL   RBC 5.08 4.22 - 5.81 MIL/uL   Hemoglobin 17.3 (H) 13.0 - 17.0 g/dL   HCT 48.4 39.0 - 52.0 %   MCV 95.3 78.0 - 100.0 fL   MCH 34.1 (H) 26.0 - 34.0 pg   MCHC 35.7 30.0 - 36.0 g/dL   RDW 13.7 11.5 - 15.5 %  Platelets 120 (L) 150 - 400 K/uL   Neutrophils Relative % 56 %    Neutro Abs 2.9 1.7 - 7.7 K/uL   Lymphocytes Relative 24 %   Lymphs Abs 1.3 0.7 - 4.0 K/uL   Monocytes Relative 20 %   Monocytes Absolute 1.1 (H) 0.1 - 1.0 K/uL   Eosinophils Relative 0 %   Eosinophils Absolute 0.0 0.0 - 0.7 K/uL   Basophils Relative 0 %   Basophils Absolute 0.0 0.0 - 0.1 K/uL    Dg Chest 2 View  Result Date: 10/20/2016 CLINICAL DATA:  Mid and upper abdominal pain associated with nausea and vomiting and abdominal distension. Symptoms increasing over the past 4 days. Also shortness of breath and hypoxia. History of right lung malignancy, COPD, former smoker. EXAM: CHEST  2 VIEW COMPARISON:  CT scan of the chest of July 02, 2016 and portable chest x-ray of April 04, 2015. FINDINGS: The lungs are adequately inflated. The interstitial markings remain increased in the right mid and lower lung and in the left mid lung. There is no alveolar infiltrate or pleural effusion. The heart and pulmonary vascularity are normal. There is calcification in the wall of the aortic arch. The porta catheter tip projects over the proximal SVC. There is multilevel degenerative disc disease of the thoracic spine. The gas pattern in the upper abdomen is unremarkable. IMPRESSION: Chronic interstitial changes bilaterally. Those on the right are likely due to previous radiation therapy. On the left may may reflect scarring. Bullous lesions in the apices. No pneumonia nor CHF. Thoracic aortic atherosclerosis. Given the patient's abdominal symptoms, supine and upright abdominal radiographs may be useful. Electronically Signed   By: Maycee Blasco  [place] M.D.   On: 10/20/2016 09:50   Ct Abdomen Pelvis W Contrast  Result Date: 10/20/2016 CLINICAL DATA:  Abdominal pain and distention. History of obstruction. History of lung cancer, hernia repair, and abdominal aortic aneurysm repair. EXAM: CT ABDOMEN AND PELVIS WITH CONTRAST TECHNIQUE: Multidetector CT imaging of the abdomen and pelvis was performed using the standard  protocol following bolus administration of intravenous contrast. CONTRAST:  100 mL Isovue-300 COMPARISON:  01/13/2016 FINDINGS: Lower chest: Centrilobular emphysema and subsegmental atelectasis in the lung bases. No pleural effusion. Three-vessel coronary artery atherosclerosis. Normal heart size. Hepatobiliary: Morphologic changes suggestive of cirrhosis are again noted including a nodular liver contour and enlargement of the lateral segment of the left hepatic lobe. No focal liver abnormality is identified. A 6 mm stone is again noted in the gallbladder which is moderately distended but without evidence of wall thickening or pericholecystic inflammation. No biliary dilatation. Pancreas: Unremarkable. Spleen: No focal abnormality. Decreased splenic volume since the prior study. Adrenals/Urinary Tract: Unremarkable adrenal glands. No evidence of renal mass, calculi, or hydronephrosis. Unremarkable bladder. Stomach/Bowel: Moderate distention of the stomach by fluid and gas. There is moderate diffuse proximal small bowel dilatation with loops measuring up to 5 cm in diameter and containing multiple air-fluid levels. There is a transition point in the central abdomen at the site of a small bowel anastomosis (series 2, image 66) with more distal small bowel being decompressed. There is diffuse colonic diverticulosis without evidence of diverticulitis. The appendix is unremarkable. Vascular/Lymphatic: Moderately advanced atherosclerosis involving the abdominal aorta and its major branch vessels. Prior infrarenal abdominal aortic aneurysm repair. The aorta measures up to 3.3 cm in AP diameter, unchanged. 10 mm portacaval and 9 mm periportal/peripancreatic lymph nodes are unchanged and likely reactive due to chronic liver disease. Reproductive: Mildly enlarged prostate. Other: Unchanged  small fat containing right inguinal hernia. Trace perihepatic fluid. Musculoskeletal: Bilateral L5 pars defects with grade 1  anterolisthesis, unchanged. Multilevel lumbar disc degeneration, greatest at L2-3. IMPRESSION: 1. Small bowel obstruction with transition point in the central abdomen at a small bowel anastomosis. 2. Aortic atherosclerosis.  Prior abdominal aortic aneurysm repair. 3. Morphologic changes of cirrhosis.  Trace perihepatic fluid. 4. Cholelithiasis. Electronically Signed   By: Logan Bores M.D.   On: 10/20/2016 10:37    Review of Systems  Constitutional: Negative.   HENT: Negative.   Eyes: Negative.   Respiratory: Positive for shortness of breath (DOBE10-15 yards.) and wheezing.        On O2 here with room air sats 89%.  Cardiovascular: Negative.   Gastrointestinal: Positive for abdominal pain, diarrhea, heartburn, nausea and vomiting. Negative for blood in stool, constipation and melena.  Genitourinary:       Decreased urine output is only voided once today.  Musculoskeletal: Positive for back pain.  Skin: Negative.   Neurological: Negative.   Endo/Heme/Allergies: Negative.   Psychiatric/Behavioral: The patient has insomnia.    Pulse (!) 125, temperature 98.3 F (36.8 C), temperature source Oral, resp. rate 20, SpO2 (!) 89 %. Physical Exam  Constitutional: He is oriented to person, place, and time. No distress.  Chronic ill-appearing white male on oxygen. Coughing with course breath sounds and rhonchi. He is able to clear the secretions.  HENT:  Head: Normocephalic and atraumatic.  Mouth/Throat: No oropharyngeal exudate.  Eyes: Right eye exhibits no discharge. Left eye exhibits no discharge. No scleral icterus.  Neck: Normal range of motion. Neck supple. No JVD present. No tracheal deviation present. No thyromegaly present.  Cardiovascular: Regular rhythm.   Tachycardic heart rate in the 120s to 130s Decreased her sounds. His pulses bilaterally.  Respiratory: Effort normal and breath sounds normal. No stridor. No respiratory distress. He has no wheezes. He has no rales. He exhibits no  tenderness.  On O2  GI: He exhibits distension. He exhibits no mass. There is no tenderness. There is no rebound and no guarding.  Very distended tight abdomen, few high-pitched hyperactive bowel sounds. Well-healed midline incision, small ventral hernia right lower abdomen  Musculoskeletal: He exhibits no edema or tenderness.  Lymphadenopathy:    He has no cervical adenopathy.  Neurological: He is alert and oriented to person, place, and time. No cranial nerve deficit.  Skin: Skin is warm and dry. No rash noted. He is not diaphoretic. No erythema. No pallor.  Psychiatric: He has a normal mood and affect. His behavior is normal. Judgment and thought content normal.    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 CAD/CHF History of AF last admit with small bowel obstruction. History of cirrhosis Hypothyroid Peripheral vascular disease - status post abdominal aortic aneurysm repair  Plan: Recommend admission by medicine.medical management of his COPD/CAD with prior history of cirrhosis. NG decompression, IV fluid hydration, bowel rest.   Watch him after the NG is placed and then if not open plan small bowel protocol later this p.m.   JENNINGS,WILLARD 10/20/2016, 11:28 AM   Agree with above. His wife was in the room with the patient.  He had been feeling more distended for several days.  Then got more acutely ill over the 2 days. Unable to pass NGT.  But he has had a large loose BM since coming to the floor.  So will leave NGT out for tonight, check KUB in AM. His last operation was a posterior component separation-TAR in 04/04/2015.  Alphonsa Overall, MD, [facility] Surgery Pager:  [phone] Office phone:  [phone]

## 2016-10-20 NOTE — Progress Notes (Signed)
Dr. Lucia Gaskins consulted.  Recommended to not try and attempt NG tube again tonight.  Will reevaluate after abdominal x-ray in am.

## 2016-10-20 NOTE — ED Provider Notes (Signed)
[facility] DEPT Provider Note   CSN: [id number] Arrival date & time: 10/20/16  0725     History   Chief Complaint Chief Complaint  Patient presents with  . Nausea  . Vomiting    HPI JACQUEES GONGORA is a 76 y.o. male.  Patient is a 76 year old male who presents with abdominal pain and distention. He has a history of prior bowel obstruction in 2015 that required operative repair, partial bowel resection and lysis of adhesions.  In 2016, he had repair of a ventral hernia with mesh. This was done by Dr. Redmond Pulling. He reports a three-day history of worsening abdominal pain with distention and nausea. He has had 2 episodes of vomiting. He did try an enema and had a small watery bowel movement this morning. He denies any known fevers. No urinary symptoms. 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 He is currently on a nasal cannula at 2 L and his oxygen saturations are in the mid 90s. He was 89% on room air. He denies any shortness of breath. No cough or chest congestion.      Past Medical History:  Diagnosis Date  . Acute on chronic diastolic CHF (congestive heart failure), NYHA class 3 ([place]) 07/23/2014  . Arthritis    left knee and back arthrtis  . Atrial fibrillation with rapid ventricular response ([place]) 07/04/2014  . CAD (coronary artery disease)   . Cataract   . Cirrhosis ([place])    By radiography, seen on CT  . Diverticulosis   . Emphysema   . Enlarged prostate   . Heart murmur    teen  . History of colon polyps 2006,2008  . History of transfusion 2015  . Hx of radiation therapy 09/02/11 to 10/19/11   chest  . Hx of radiation therapy   . Hyperlipidemia    takes Pravastatin  . Hypothyroidism    takes Synthroid daily  . Lung cancer ([place])    right upper lobe  . Personal history of adenomatous colonic polyps 07/04/2012   2006, adenomatous right colon polyp removed, then ablated in 2007 followup, 2008,  adenomatous polyp of the right colon removed with hot biopsy forceps. All by Dr. Lajoyce Corners 01/03/2013 diminutive polyp ascending colon - adenoma    . Rosacea   . Shortness of breath    with exertion  . Small bowel obstruction 07/05/2014  . Status post chemotherapy    last chemo 2013  . Umbilical hernia, incarcerated- surgery 07/10/14 07/05/2014    Patient Active Problem List   Diagnosis Date Noted  . Chronic pain of left knee 07/06/2016  . Port catheter in place 02/27/2016  . Claudication ([place]) 02/18/2016  . Statin intolerance 02/18/2016  . Essential hypertension 02/18/2016  . Coronary artery disease due to lipid rich plaque 08/29/2015  . Acute on chronic diastolic CHF (congestive heart failure), NYHA class 2 ([place]) 08/29/2015  . 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 Hypoxemia requiring supplemental oxygen   . COPD (chronic obstructive pulmonary disease) ([place]) 07/06/2014  . Hypothyroidism 07/06/2014  . Numbness-Left Thigh 12/14/2013  . AAA- s/p Ao-Iliac BPG March 2015 10/20/2013  . Hyperlipidemia 10/09/2013  . Hepatic cirrhosis ([place]) 12/02/2012  . Cancer of right lung ([place]) 08/06/2011    Past Surgical History:  Procedure Laterality Date  . ABDOMINAL AORTAGRAM  10/20/2013   Procedure: ABDOMINAL Maxcine Ham;  Surgeon: Elam Dutch, MD;  Location: [facility] CATH LAB;  Service: Cardiovascular;;  . ABDOMINAL AORTIC ANEURYSM REPAIR N/A 10/31/2013   Procedure: aorto to left external iliac and right external iliac and left internal iliac, reimplantation of inferior mesenteric artery and ligation of left iliac artery aneursym;  Surgeon: Elam Dutch, MD;  Location: [facility];  Service: Vascular;  Laterality: N/A;  . ABDOMINAL WOUND DEHISCENCE N/A 11/10/2013   Procedure: ABDOMINAL WOUND CLOSURE ;  Surgeon: Serafina Mitchell, MD;  Location: [facility];  Service: Vascular;  Laterality: N/A;  . bletharoplasty    . BOWEL RESECTION N/A 07/10/2014   Procedure: SMALL  BOWEL RESECTION;  Surgeon: [name], MD;  Location: [facility];  Service: General;  Laterality: N/A;  . CARDIAC CATHETERIZATION  2003  . COLONOSCOPY W/ BIOPSIES     multiple   . ESOPHAGOGASTRODUODENOSCOPY  2006  . FINGER SURGERY  2008   left pointer finger  . HERNIA REPAIR  70's  . INCISIONAL HERNIA REPAIR N/A 07/10/2014   Procedure: HERNIA REPAIR INCISIONAL;  Surgeon: [name], MD;  Location: [facility];  Service: General;  Laterality: N/A;  . KNEE ARTHROSCOPY  2008   left knee  . LAPAROTOMY N/A 07/10/2014   Procedure: EXPLORATORY LAPAROTOMY;  Surgeon: [name], MD;  Location: [facility];  Service: General;  Laterality: N/A;  . LOWER EXTREMITY ANGIOGRAM Bilateral 10/20/2013   Procedure: LOWER EXTREMITY ANGIOGRAM;  Surgeon: Elam Dutch, MD;  Location: [facility] CATH LAB;  Service: Cardiovascular;  Laterality: Bilateral;  . LYSIS OF ADHESION N/A 07/10/2014   Procedure: LYSIS OF ADHESION - 90 minutes;  Surgeon: [name], MD;  Location: [facility];  Service: General;  Laterality: N/A;  . PORTACATH PLACEMENT  08/10/2011   Procedure: INSERTION PORT-A-CATH;  Surgeon: Pierre Bali, MD;  Location: [facility];  Service: Thoracic;  Laterality: Left;  Left Subclavian  . PTOSIS REPAIR Bilateral 12-02-2015  . THROMBECTOMY ILIAC ARTERY Bilateral 10/31/2013   Procedure: THROMBECTOMY ILIAC ARTERY;  Surgeon: Elam Dutch, MD;  Location: [facility];  Service: Vascular;  Laterality: Bilateral;  . TONSILLECTOMY     as a child  . VASECTOMY  70's  . VENTRAL HERNIA REPAIR N/A 04/04/2015   Procedure: OPEN REPAIR OF RECURRENT ABDOMINAL WALL HERNIA WITH MESH, COMPLEX LYSIS OF ADHESIONS X1 HR TAR COMPONENT SEPARATION;  Surgeon: Greer Pickerel, MD;  Location: [facility];  Service: General;  Laterality: N/A;       Home Medications    Prior to Admission medications   Medication Sig Start Date End Date Taking? Authorizing Provider  acidophilus (RISAQUAD) CAPS capsule Take 1 capsule by mouth daily.   Yes Historical Provider,  MD  albuterol (PROVENTIL HFA;VENTOLIN HFA) 108 (90 Base) MCG/ACT inhaler Inhale 1-2 puffs into the lungs every 6 (six) hours as needed for wheezing or shortness of breath.   Yes Historical Provider, MD  aspirin EC 81 MG tablet Take 1 tablet (81 mg total) by mouth daily. 08/11/16  Yes Dorothy Spark, MD  furosemide (LASIX) 20 MG tablet Take 1 tablet (20 mg total) by mouth daily. 08/06/16  Yes Dorothy Spark, MD  HYDROcodone-acetaminophen (NORCO/VICODIN) 5-325 MG tablet Take 1 tablet by mouth every 4 (four) hours as needed for moderate pain.   Yes Historical Provider, MD  L-ARGININE PO Take 1 tablet by mouth 2 (two) times daily.    Yes Historical Provider, MD  metoprolol tartrate (LOPRESSOR) 25 MG tablet Take 25 mg by mouth 2 (two) times daily.   Yes Historical Provider,  MD  Multiple Vitamin (MULTIVITAMIN WITH MINERALS) TABS tablet Take 1 tablet by mouth daily.   Yes Historical Provider, MD  nystatin-triamcinolone (MYCOLOG II) cream Apply 1 application topically 2 (two) times daily as needed (for itching).    Yes Historical Provider, MD  OVER THE COUNTER MEDICATION Take 2 tablets by mouth 2 (two) times daily. Medication:  Lung Support   Yes Historical Provider, MD  Specialty Vitamins Products (PROSTATE PO) Take 1 tablet by mouth 2 (two) times daily.   Yes Historical Provider, MD  SYNTHROID 175 MCG tablet Take 175 mcg by mouth daily before breakfast.    Yes Stephens Shire, MD  tiotropium (SPIRIVA) 18 MCG inhalation capsule Place 18 mcg into inhaler and inhale daily.    Yes Historical Provider, MD  vitamin B-12 (CYANOCOBALAMIN) 1000 MCG tablet Take 1,000 mcg by mouth daily.   Yes Historical Provider, MD    Family History Family History  Problem Relation Age of Onset  . Cancer Father   . Diabetes Father   . Hyperlipidemia Mother   . Hypertension Mother   . Hyperlipidemia Brother   . AAA (abdominal aortic aneurysm) Brother   . Hypertension Brother   . Hyperlipidemia Sister   . Hypertension  Sister   . Anesthesia problems Neg Hx   . Hypotension Neg Hx   . Malignant hyperthermia Neg Hx   . Pseudochol deficiency Neg Hx   . Colon cancer Neg Hx     Social History Social History  Substance Use Topics  . Smoking status: Former Smoker    Packs/day: 1.50    Years: 40.00    Types: Cigarettes    Quit date: 07/02/2011  . Smokeless tobacco: Former Systems developer  . Alcohol use 0.0 oz/week     Comment: social     Allergies   Patient has no known allergies.   Review of Systems Review of Systems  Constitutional: Negative for chills, diaphoresis, fatigue and fever.  HENT: Negative for congestion, rhinorrhea and sneezing.   Eyes: Negative.   Respiratory: Negative for cough, chest tightness and shortness of breath.   Cardiovascular: Negative for chest pain and leg swelling.  Gastrointestinal: Positive for abdominal pain, nausea and vomiting. Negative for blood in stool and diarrhea.  Genitourinary: Negative for difficulty urinating, flank pain, frequency and hematuria.  Musculoskeletal: Negative for arthralgias and back pain.  Skin: Negative for rash.  Neurological: Negative for dizziness, speech difficulty, weakness, numbness and headaches.     Physical Exam Updated Vital Signs BP 100/77 (BP Location: Right Arm)   Pulse 119   Temp 98.3 F (36.8 C) (Oral)   Resp 14   SpO2 92%   Physical Exam  Constitutional: He is oriented to person, place, and time. He appears well-developed and well-nourished.  HENT:  Head: Normocephalic and atraumatic.  Eyes: Pupils are equal, round, and reactive to light.  Neck: Normal range of motion. Neck supple.  Cardiovascular: Normal rate, regular rhythm and normal heart sounds.   Pulmonary/Chest: Effort normal and breath sounds normal. No respiratory distress. He has no wheezes. He has no rales. He exhibits no tenderness.  Abdominal: Soft. Bowel sounds are normal. He exhibits distension. There is tenderness. There is no rebound and no guarding.    Mild diffuse tenderness with tympany to percussion  Musculoskeletal: Normal range of motion. He exhibits no edema.  Lymphadenopathy:    He has no cervical adenopathy.  Neurological: He is alert and oriented to person, place, and time.  Skin: Skin is warm and dry.  No rash noted.  Psychiatric: He has a normal mood and affect.     ED Treatments / Results  Labs (all labs ordered are listed, but only abnormal results are displayed) Labs Reviewed  COMPREHENSIVE METABOLIC PANEL - Abnormal; Notable for the following:       Result Value   Glucose, Bld 129 (*)    BUN 27 (*)    Creatinine, Ser 1.33 (*)    AST 44 (*)    Total Bilirubin 3.0 (*)    GFR calc non Af Amer 51 (*)    GFR calc Af Amer 60 (*)    All other components within normal limits  CBC WITH DIFFERENTIAL/PLATELET - Abnormal; Notable for the following:    Hemoglobin 17.3 (*)    MCH 34.1 (*)    Platelets 120 (*)    Monocytes Absolute 1.1 (*)    All other components within normal limits  URINALYSIS, ROUTINE W REFLEX MICROSCOPIC    EKG  EKG Interpretation None       Radiology Dg Chest 2 View  Result Date: 10/20/2016 CLINICAL DATA:  Mid and upper abdominal pain associated with nausea and vomiting and abdominal distension. Symptoms increasing over the past 4 days. Also shortness of breath and hypoxia. History of right lung malignancy, COPD, former smoker. EXAM: CHEST  2 VIEW COMPARISON:  CT scan of the chest of July 02, 2016 and portable chest x-ray of April 04, 2015. FINDINGS: The lungs are adequately inflated. The interstitial markings remain increased in the right mid and lower lung and in the left mid lung. There is no alveolar infiltrate or pleural effusion. The heart and pulmonary vascularity are normal. There is calcification in the wall of the aortic arch. The porta catheter tip projects over the proximal SVC. There is multilevel degenerative disc disease of the thoracic spine. The gas pattern in the upper abdomen  is unremarkable. IMPRESSION: Chronic interstitial changes bilaterally. Those on the right are likely due to previous radiation therapy. On the left may may reflect scarring. Bullous lesions in the apices. No pneumonia nor CHF. Thoracic aortic atherosclerosis. Given the patient's abdominal symptoms, supine and upright abdominal radiographs may be useful. Electronically Signed   By: David  Martinique M.D.   On: 10/20/2016 09:50   Ct Abdomen Pelvis W Contrast  Result Date: 10/20/2016 CLINICAL DATA:  Abdominal pain and distention. History of obstruction. History of lung cancer, hernia repair, and abdominal aortic aneurysm repair. EXAM: CT ABDOMEN AND PELVIS WITH CONTRAST TECHNIQUE: Multidetector CT imaging of the abdomen and pelvis was performed using the standard protocol following bolus administration of intravenous contrast. CONTRAST:  100 mL Isovue-300 COMPARISON:  01/13/2016 FINDINGS: Lower chest: Centrilobular emphysema and subsegmental atelectasis in the lung bases. No pleural effusion. Three-vessel coronary artery atherosclerosis. Normal heart size. Hepatobiliary: Morphologic changes suggestive of cirrhosis are again noted including a nodular liver contour and enlargement of the lateral segment of the left hepatic lobe. No focal liver abnormality is identified. A 6 mm stone is again noted in the gallbladder which is moderately distended but without evidence of wall thickening or pericholecystic inflammation. No biliary dilatation. Pancreas: Unremarkable. Spleen: No focal abnormality. Decreased splenic volume since the prior study. Adrenals/Urinary Tract: Unremarkable adrenal glands. No evidence of renal mass, calculi, or hydronephrosis. Unremarkable bladder. Stomach/Bowel: Moderate distention of the stomach by fluid and gas. There is moderate diffuse proximal small bowel dilatation with loops measuring up to 5 cm in diameter and containing multiple air-fluid levels. There is a transition point in  the central  abdomen at the site of a small bowel anastomosis (series 2, image 66) with more distal small bowel being decompressed. There is diffuse colonic diverticulosis without evidence of diverticulitis. The appendix is unremarkable. Vascular/Lymphatic: Moderately advanced atherosclerosis involving the abdominal aorta and its major branch vessels. Prior infrarenal abdominal aortic aneurysm repair. The aorta measures up to 3.3 cm in AP diameter, unchanged. 10 mm portacaval and 9 mm periportal/peripancreatic lymph nodes are unchanged and likely reactive due to chronic liver disease. Reproductive: Mildly enlarged prostate. Other: Unchanged small fat containing right inguinal hernia. Trace perihepatic fluid. Musculoskeletal: Bilateral L5 pars defects with grade 1 anterolisthesis, unchanged. Multilevel lumbar disc degeneration, greatest at L2-3. IMPRESSION: 1. Small bowel obstruction with transition point in the central abdomen at a small bowel anastomosis. 2. Aortic atherosclerosis.  Prior abdominal aortic aneurysm repair. 3. Morphologic changes of cirrhosis.  Trace perihepatic fluid. 4. Cholelithiasis. Electronically Signed   By: Logan Bores M.D.   On: 10/20/2016 10:37    Procedures Procedures (including critical care time)  Medications Ordered in ED Medications  iopamidol (ISOVUE-300) 61 % injection (not administered)  sodium chloride 0.9 % injection (not administered)  sodium chloride 0.9 % bolus 500 mL (0 mLs Intravenous Stopped 10/20/16 0956)  ondansetron (ZOFRAN) injection 4 mg (4 mg Intravenous Given 10/20/16 0855)  iopamidol (ISOVUE-300) 61 % injection (100 mLs  Contrast Given 10/20/16 0949)  iopamidol (ISOVUE-300) 61 % injection 30 mL (30 mLs Oral Contrast Given 10/20/16 0915)     Initial Impression / Assessment and Plan / ED Course  I have reviewed the triage vital signs and the nursing notes.  Pertinent labs & imaging results that were available during my care of the patient were reviewed by me and  considered in my medical decision making (see chart for details).     Patient has evidence of small bowel obstruction on CT. I spoke with general surgery who will consult on patient. They're requesting hospitalist admission. I spoke with Dr. Carles Collet who will admit the patient. NG tube was ordered as well.  Final Clinical Impressions(s) / ED Diagnoses   Final diagnoses:  Small bowel obstruction    New Prescriptions New Prescriptions   No medications on file     Malvin Johns, MD 10/20/16 1222

## 2016-10-20 NOTE — ED Notes (Signed)
Informed MD that pt was not able to urinate.

## 2016-10-20 NOTE — ED Triage Notes (Signed)
Per EMS, pt is from home with complaints n/v for two days. Pt has a hx of bowel obstructions. Pt went to GI provider yesterday and they stated pt possibly had a bowel obstruction. EMS states pt had abdominal distention and denies pain at this time. Pt was obstructed to seek further medical attention if vomiting started. Pt reports last obstruction being resolved with surgery. Pt received 500 ml NS and 4 mg zofran. Last bowel movement this morning 2/20.

## 2016-10-20 NOTE — ED Notes (Signed)
2 unsuccessful attempts at NG tube placement. Provider made aware.

## 2016-10-20 NOTE — Progress Notes (Signed)
Third  attempt at placing NG tube by RN.  Right nare unable to pass tube and crepitus/cracking audible heard when trying to pass tube.  Attempted left nare and moderate amount of bleeding started from left nare and resistance meet, unable to pass tube.  Radiology called to assist in tube placement.  Radiologist stated that they cannot assist with case. Dr Tat paged and made aware. Luciann Gossett A

## 2016-10-21 ENCOUNTER — Inpatient Hospital Stay (HOSPITAL_COMMUNITY): Payer: PPO

## 2016-10-21 DIAGNOSIS — R748 Abnormal levels of other serum enzymes: Secondary | ICD-10-CM

## 2016-10-21 DIAGNOSIS — I10 Essential (primary) hypertension: Secondary | ICD-10-CM

## 2016-10-21 DIAGNOSIS — R790 Abnormal level of blood mineral: Secondary | ICD-10-CM

## 2016-10-21 DIAGNOSIS — R7989 Other specified abnormal findings of blood chemistry: Secondary | ICD-10-CM

## 2016-10-21 DIAGNOSIS — I480 Paroxysmal atrial fibrillation: Secondary | ICD-10-CM

## 2016-10-21 DIAGNOSIS — J9601 Acute respiratory failure with hypoxia: Secondary | ICD-10-CM

## 2016-10-21 DIAGNOSIS — K56609 Unspecified intestinal obstruction, unspecified as to partial versus complete obstruction: Secondary | ICD-10-CM

## 2016-10-21 LAB — BASIC METABOLIC PANEL
Anion gap: 10 (ref 5–15)
BUN: 39 mg/dL — AB (ref 6–20)
CALCIUM: 8.6 mg/dL — AB (ref 8.9–10.3)
CO2: 20 mmol/L — AB (ref 22–32)
CREATININE: 1.56 mg/dL — AB (ref 0.61–1.24)
Chloride: 107 mmol/L (ref 101–111)
GFR calc non Af Amer: 42 mL/min — ABNORMAL LOW (ref >60)
GFR, EST AFRICAN AMERICAN: 49 mL/min — AB (ref >60)
GLUCOSE: 92 mg/dL (ref 65–99)
Potassium: 4.1 mmol/L (ref 3.5–5.1)
Sodium: 137 mmol/L (ref 135–145)

## 2016-10-21 LAB — C DIFFICILE QUICK SCREEN W PCR REFLEX
C DIFFICLE (CDIFF) ANTIGEN: NEGATIVE
C Diff interpretation: NOT DETECTED
C Diff toxin: NEGATIVE

## 2016-10-21 LAB — CBC WITH DIFFERENTIAL/PLATELET
BASOS PCT: 0 %
Basophils Absolute: 0 10*3/uL (ref 0.0–0.1)
EOS ABS: 0 10*3/uL (ref 0.0–0.7)
EOS PCT: 0 %
HCT: 43.4 % (ref 39.0–52.0)
Hemoglobin: 15.3 g/dL (ref 13.0–17.0)
Lymphocytes Relative: 24 %
Lymphs Abs: 1.4 10*3/uL (ref 0.7–4.0)
MCH: 33.8 pg (ref 26.0–34.0)
MCHC: 35.3 g/dL (ref 30.0–36.0)
MCV: 96 fL (ref 78.0–100.0)
MONO ABS: 0.8 10*3/uL (ref 0.1–1.0)
MONOS PCT: 14 %
Neutro Abs: 3.6 10*3/uL (ref 1.7–7.7)
Neutrophils Relative %: 61 %
Platelets: 91 10*3/uL — ABNORMAL LOW (ref 150–400)
RBC: 4.52 MIL/uL (ref 4.22–5.81)
RDW: 13.9 % (ref 11.5–15.5)
WBC: 5.9 10*3/uL (ref 4.0–10.5)

## 2016-10-21 LAB — TSH: TSH: 0.587 u[IU]/mL (ref 0.350–4.500)

## 2016-10-21 LAB — LACTIC ACID, PLASMA
LACTIC ACID, VENOUS: 2.8 mmol/L — AB (ref 0.5–1.9)
LACTIC ACID, VENOUS: 1.4 mmol/L (ref 0.5–1.9)
LACTIC ACID, VENOUS: 1.4 mmol/L (ref 0.5–1.9)

## 2016-10-21 LAB — TROPONIN I: TROPONIN I: 0.03 ng/mL — AB (ref <0.03)

## 2016-10-21 LAB — MAGNESIUM: Magnesium: 1.5 mg/dL — ABNORMAL LOW (ref 1.7–2.4)

## 2016-10-21 LAB — PHOSPHORUS: Phosphorus: 3.7 mg/dL (ref 2.5–4.6)

## 2016-10-21 MED ORDER — SODIUM CHLORIDE 0.9% FLUSH: 10.0000 mL | Freq: Two times a day (BID) | INTRAVENOUS | Status: DC

## 2016-10-21 MED ORDER — MAGNESIUM SULFATE 2 GM/50ML IV SOLN
2.0000 g | Freq: Once | INTRAVENOUS | Status: AC
  Administered 2016-10-21: 2 g via INTRAVENOUS
  Filled 2016-10-21: qty 50

## 2016-10-21 MED ORDER — ALTEPLASE 2 MG IJ SOLR
2.0000 mg | Freq: Once | INTRAMUSCULAR | Status: AC
  Administered 2016-10-21: 2 mg
  Filled 2016-10-21: qty 2

## 2016-10-21 MED ORDER — SODIUM CHLORIDE 0.9 % IV BOLUS (SEPSIS)
1000.0000 mL | Freq: Once | INTRAVENOUS | Status: AC
Start: 2016-10-21 — End: 2016-10-21
  Administered 2016-10-21: 1000 mL via INTRAVENOUS

## 2016-10-21 MED ORDER — SODIUM CHLORIDE 0.9% FLUSH
10.0000 mL | INTRAVENOUS | Status: DC | PRN
  Administered 2016-10-24: 10 mL
  Filled 2016-10-21: qty 40

## 2016-10-21 MED ORDER — LEVALBUTEROL HCL 0.63 MG/3ML IN NEBU: 0.6300 mg | INHALATION_SOLUTION | Freq: Three times a day (TID) | RESPIRATORY_TRACT | Status: DC

## 2016-10-21 MED ORDER — SODIUM CHLORIDE 0.9 % IV BOLUS (SEPSIS): 500.0000 mL | Freq: Once | INTRAVENOUS | Status: DC

## 2016-10-21 MED ORDER — LEVALBUTEROL HCL 0.63 MG/3ML IN NEBU: 0.6300 mg | INHALATION_SOLUTION | Freq: Four times a day (QID) | RESPIRATORY_TRACT | Status: DC | PRN

## 2016-10-21 MED ORDER — METOPROLOL TARTRATE 5 MG/5ML IV SOLN
2.5000 mg | Freq: Once | INTRAVENOUS | Status: AC
  Administered 2016-10-21: 2.5 mg via INTRAVENOUS
  Filled 2016-10-21: qty 5

## 2016-10-21 MED ORDER — SODIUM CHLORIDE 0.9 % IV BOLUS (SEPSIS)
1000.0000 mL | Freq: Once | INTRAVENOUS | Status: AC
  Administered 2016-10-21: 1000 mL via INTRAVENOUS

## 2016-10-21 MED ORDER — TIOTROPIUM BROMIDE MONOHYDRATE 18 MCG IN CAPS
18.0000 ug | ORAL_CAPSULE | Freq: Every day | RESPIRATORY_TRACT | Status: DC
  Administered 2016-10-22 – 2016-10-25 (×4): 18 ug via RESPIRATORY_TRACT
  Filled 2016-10-21: qty 5

## 2016-10-21 MED ORDER — METOPROLOL TARTRATE 5 MG/5ML IV SOLN
5.0000 mg | Freq: Four times a day (QID) | INTRAVENOUS | Status: DC
  Administered 2016-10-21 – 2016-10-23 (×10): 5 mg via INTRAVENOUS
  Filled 2016-10-21 (×10): qty 5

## 2016-10-21 MED ORDER — LEVALBUTEROL HCL 0.63 MG/3ML IN NEBU
0.6300 mg | INHALATION_SOLUTION | Freq: Two times a day (BID) | RESPIRATORY_TRACT | Status: DC
  Administered 2016-10-21 – 2016-10-25 (×8): 0.63 mg via RESPIRATORY_TRACT
  Filled 2016-10-21 (×8): qty 3

## 2016-10-21 NOTE — Progress Notes (Signed)
CRITICAL VALUE ALERT  Critical value received:  Troponin 0.03  Date of notification:  10/21/16  Time of notification:  1650  Critical value read back:Yes.    Nurse who received alert:  Star Age  MD notified (1st page):  Dr Dyann Kief  Time of first page:  1655  MD notified (2nd page):  Time of second page:  Responding MD:  Dr. Dyann Kief   Time MD responded:  [phone]

## 2016-10-21 NOTE — Progress Notes (Signed)
PT Cancellation Note  Patient Details Name: Joseph [name] MRN: [id number] DOB: 07-04-1941   Cancelled Treatment:    Reason Eval/Treat Not Completed: Medical issues which prohibited therapy--increased HR, low BP, awaiting VQ scan. Will hold PT today and check back another day. Thanks.    Weston Anna, MPT Pager: [phone]

## 2016-10-21 NOTE — Progress Notes (Signed)
Dr. Hal Hope from [facility] saw pt at bedside for new AF with RVR. Bolus and metoprolol were given. Pt not in distress. No CP. Labs were ordered. NP following up. BMP with increase in creatinine from 1.3 to 1.56. K+ normal. CBC normal except slight drop in platelets. Trop < .03. LA 2.8. TSH and EKG pending. In review of chart, found elevated DDimer from yesterday. Last Vitals reveal ST at 110.  Plan: Bolus 1L again. Continue to cycle trops. R/p LA at 6am, 9am. Given worsening creatinine, will obtain VQ scan to r/o PE. Follow. KJKG, NP [facility]

## 2016-10-21 NOTE — Progress Notes (Addendum)
MD at bedside new orders noted and received.

## 2016-10-21 NOTE — Progress Notes (Signed)
Pt A Fib with RVR with a rate of 150's. BP 99/64 pt asymptomatic. MD notified. Will continue to monitor.

## 2016-10-21 NOTE — Progress Notes (Signed)
Subjective: Is good and is having a lot of diarrhea now no films ordered for this a.m. No abdominal discomfort currently. We were never able to get an NG tube in him.  Objective: Vital signs in last 24 hours: Temp:  [98.4 F (36.9 C)-98.5 F (36.9 C)] 98.5 F (36.9 C) (02/21 0408) Pulse Rate:  [101-160] 155 (02/21 1212) Resp:  [16-22] 21 (02/21 0532) BP: (93-140)/(64-93) 98/65 (02/21 1212) SpO2:  [93 %-97 %] 94 % (02/21 0700) Weight:  [98 kg (216 lb)] 98 kg (216 lb) (02/20 2117) Last BM Date: 10/21/16 Nothing by mouth 813 IV BM 37 recorded Afebrile heart rate between 118 and 203. BP is down with the elevated heart rates. Sats are 9394% on 2 L nasal cannula. Labs showed a rise in creatinine to 1.56. Troponins are negative CBC is normal.  Intake/Output from previous day: 02/20 0701 - 02/21 0700 In: 822.1 [I.V.:813.8; IV Piggyback:8.3] Out: 37 [Stool:37] Intake/Output this shift: No intake/output data recorded.  General appearance: alert, cooperative and no distress GI: soft, non-tender; bowel sounds normal; no masses,  no organomegaly  Lab Results:   Recent Labs  10/20/16 0852 10/21/16 0310  WBC 5.3 5.9  HGB 17.3* 15.3  HCT 48.4 43.4  PLT 120* 91*    BMET  Recent Labs  10/20/16 0852 10/21/16 0310  NA 136 137  K 4.5 4.1  CL 101 107  CO2 23 20*  GLUCOSE 129* 92  BUN 27* 39*  CREATININE 1.33* 1.56*  CALCIUM 10.1 8.6*   PT/INR No results for input(s): LABPROT, INR in the last 72 hours.   Recent Labs Lab 10/20/16 0852  AST 44*  ALT 30  ALKPHOS 114  BILITOT 3.0*  PROT 7.5  ALBUMIN 3.8     Lipase     Component Value Date/Time   LIPASE 26 07/04/2014 1858     Studies/Results: Dg Chest 2 View  Result Date: 10/20/2016 CLINICAL DATA:  Mid and upper abdominal pain associated with nausea and vomiting and abdominal distension. Symptoms increasing over the past 4 days. Also shortness of breath and hypoxia. History of right lung malignancy, COPD,  former smoker. EXAM: CHEST  2 VIEW COMPARISON:  CT scan of the chest of July 02, 2016 and portable chest x-ray of April 04, 2015. FINDINGS: The lungs are adequately inflated. The interstitial markings remain increased in the right mid and lower lung and in the left mid lung. There is no alveolar infiltrate or pleural effusion. The heart and pulmonary vascularity are normal. There is calcification in the wall of the aortic arch. The porta catheter tip projects over the proximal SVC. There is multilevel degenerative disc disease of the thoracic spine. The gas pattern in the upper abdomen is unremarkable. IMPRESSION: Chronic interstitial changes bilaterally. Those on the right are likely due to previous radiation therapy. On the left may may reflect scarring. Bullous lesions in the apices. No pneumonia nor CHF. Thoracic aortic atherosclerosis. Given the patient's abdominal symptoms, supine and upright abdominal radiographs may be useful. Electronically Signed   By: Milderd Manocchio  [place] M.D.   On: 10/20/2016 09:50   Ct Abdomen Pelvis W Contrast  Result Date: 10/20/2016 CLINICAL DATA:  Abdominal pain and distention. History of obstruction. History of lung cancer, hernia repair, and abdominal aortic aneurysm repair. EXAM: CT ABDOMEN AND PELVIS WITH CONTRAST TECHNIQUE: Multidetector CT imaging of the abdomen and pelvis was performed using the standard protocol following bolus administration of intravenous contrast. CONTRAST:  100 mL Isovue-300 COMPARISON:  01/13/2016 FINDINGS: Lower  chest: Centrilobular emphysema and subsegmental atelectasis in the lung bases. No pleural effusion. Three-vessel coronary artery atherosclerosis. Normal heart size. Hepatobiliary: Morphologic changes suggestive of cirrhosis are again noted including a nodular liver contour and enlargement of the lateral segment of the left hepatic lobe. No focal liver abnormality is identified. A 6 mm stone is again noted in the gallbladder which is moderately  distended but without evidence of wall thickening or pericholecystic inflammation. No biliary dilatation. Pancreas: Unremarkable. Spleen: No focal abnormality. Decreased splenic volume since the prior study. Adrenals/Urinary Tract: Unremarkable adrenal glands. No evidence of renal mass, calculi, or hydronephrosis. Unremarkable bladder. Stomach/Bowel: Moderate distention of the stomach by fluid and gas. There is moderate diffuse proximal small bowel dilatation with loops measuring up to 5 cm in diameter and containing multiple air-fluid levels. There is a transition point in the central abdomen at the site of a small bowel anastomosis (series 2, image 66) with more distal small bowel being decompressed. There is diffuse colonic diverticulosis without evidence of diverticulitis. The appendix is unremarkable. Vascular/Lymphatic: Moderately advanced atherosclerosis involving the abdominal aorta and its major branch vessels. Prior infrarenal abdominal aortic aneurysm repair. The aorta measures up to 3.3 cm in AP diameter, unchanged. 10 mm portacaval and 9 mm periportal/peripancreatic lymph nodes are unchanged and likely reactive due to chronic liver disease. Reproductive: Mildly enlarged prostate. Other: Unchanged small fat containing right inguinal hernia. Trace perihepatic fluid. Musculoskeletal: Bilateral L5 pars defects with grade 1 anterolisthesis, unchanged. Multilevel lumbar disc degeneration, greatest at L2-3. IMPRESSION: 1. Small bowel obstruction with transition point in the central abdomen at a small bowel anastomosis. 2. Aortic atherosclerosis.  Prior abdominal aortic aneurysm repair. 3. Morphologic changes of cirrhosis.  Trace perihepatic fluid. 4. Cholelithiasis. Electronically Signed   By: Logan Bores M.D.   On: 10/20/2016 10:37    Medications: . diatrizoate meglumine-sodium  90 mL Per NG tube Once  . enoxaparin (LOVENOX) injection  40 mg Subcutaneous Q24H  . levalbuterol  0.63 mg Nebulization BID   . levothyroxine  87.5 mcg Intravenous Daily  . magnesium sulfate 1 - 4 g bolus IVPB  2 g Intravenous Once  . metoprolol  5 mg Intravenous Q6H  . sodium chloride  500 mL Intravenous Once  . sodium chloride flush  10-40 mL Intracatheter Q12H  . sodium chloride flush  3 mL Intravenous Q12H  . tiotropium  18 mcg Inhalation Daily   . sodium chloride 75 mL/hr at 10/20/16 1809    Assessment/Plan Recurrent small bowel obstruction S/PAAA repair, abdominal dehiscence 2015\small bowel resection and hernia repair 07/10/14, ventral hernia repair 04/2015  Dehydration, with mild renal insufficiency COPD/0 pack year history of tobacco use Right lung cancer; S/P chemotherapy and radiation therapy.No surgical intervention. CAD/CHF History of AF last admit with small bowel obstruction. History of cirrhosis Hypothyroid Peripheral vascular disease - status post abdominal aortic aneurysm repair FEN:IV fluids/NPO =>> clear liquids ID: No antibiotics DVT:  Lovenox   Plan: Check for C. difficile recheck two-view abdominal films. Please having multiple stools I do not think he needs small bowel protocol.   Plan to advance him to clears.     LOS: 1 day    JENNINGS,WILLARD 10/21/2016 [phone]  Agree with above. Wife in room.  Less distended. KUB still shows dilated loops, c/w SBO.   But he is having multiple BM's. To start clr liquids and see how he does. He needs to move more.  Alphonsa Overall, MD, [facility] Pager: [phone] Office phone:  [phone]   

## 2016-10-21 NOTE — Progress Notes (Signed)
[facility] PROGRESS NOTE  Joseph [name] XNT:[id number] DOB: 21-Feb-1941 DOA: 10/20/2016 PCP: [name], MD  Interim summary and HPI 76 y.o. male with medical history of COPD, paroxysmal atrial fibrillation, coronary artery disease, non-small cell lung cancer, and previous small bowel obstruction presents with 3 day history of abdominal pain and distention and one day of nausea, vomiting. Found to have SBO most likely from adhesions.  Assessment/Plan: 1-SBO:most likely due to adhesions -unable to have NGT placed -but with conservative management and NPO status, has had multiple liquid BM's overnight; no abd pain, no nausea and no vomiting currently. -per CCS will advance diet to clears -abd x-ray still with dilated loops and fluid levels -continue electrolytes repletion -follow clinical response -continue IV meds route for now  2-dehydration and AKI: due to GI loses and poor PO intake -will continue IV fluids -Cr 1.56 today -no signs of UTI  3-acute resp failure with hypoxia: -due to A. Fib and abdominal distension -improved now that his abd is less distended and HR better controlled -will start weaning oxygen off  4-paroxysmal A. Fib -CHADsVASC score 4 -will continue IV metprolol -follow electrolytes (K and Mg and replete them as needed) -continue telemetry monitoring   5-elevated D-dimer and troponin -chronically with elevated D-dimer; even higher this time, not complaining of CP or SOB. -will pursuit V/Q scan -troponin milddly elevated from A. Fib most likely -no CP and no abnormalities seen on telemetry -will monitor -continue B-blocker   6-hypothyroidism  -will continue synthroid   7-HTN -BP is stable -continue metoprolol  8-Non-small cell lung cancer stage IIIa/IIIB -Presently in remission -Follows Dr. Julien Nordmann  9-Chronic diastolic CHF -Appears dehydrated clinically -Holding furosemide for now -Daily weights and strict I's and  O's    Code Status: Full Family Communication: no family at bedside  Disposition Plan: remains inpatient; will have diet advance to clears; adjust IV metoprolol; follow clinical response. Replete electrolytes.   Consultants:  [facility]  Procedures:  See below for x-ray reports   Antibiotics:  None   HPI/Subjective: Afebrile, no CP, no abd pain. Reporting having multiple BM's. No SOB. Patient with persistently elevated HR.  Objective: Vitals:   10/21/16 1950 10/21/16 2028  BP:  96/62  Pulse: (!) 115 (!) 108  Resp: 16 18  Temp:  98.5 F (36.9 C)    Intake/Output Summary (Last 24 hours) at 10/21/16 2134 Last data filed at 10/21/16 1600  Gross per 24 hour  Intake          1412.08 ml  Output              380 ml  Net          1032.08 ml   Filed Weights   10/20/16 2117  Weight: 98 kg (216 lb)    Exam:   General:  Afebrile, denies CP and SOB. Patient w/o nausea or vomiting. Reports having multiple liquid BM's. Also with persistently elevated HR on VS (HR 150's range)  Cardiovascular: irregular, no rubs, no gallops, no JVD  Respiratory: no crackles, no wheezing  Abdomen: soft, slightly distended, no tenderness; positive BS appreciated  Musculoskeletal: trace edema, no cyanosis    Data Reviewed: Basic Metabolic Panel:  Recent Labs Lab 10/20/16 0852 10/21/16 0310  NA 136 137  K 4.5 4.1  CL 101 107  CO2 23 20*  GLUCOSE 129* 92  BUN 27* 39*  CREATININE 1.33* 1.56*  CALCIUM 10.1 8.6*  MG  --  1.5*  PHOS  --  3.7   Liver Function Tests:  Recent Labs Lab 10/20/16 0852  AST 44*  ALT 30  ALKPHOS 114  BILITOT 3.0*  PROT 7.5  ALBUMIN 3.8   CBC:  Recent Labs Lab 10/20/16 0852 10/21/16 0310  WBC 5.3 5.9  NEUTROABS 2.9 3.6  HGB 17.3* 15.3  HCT 48.4 43.4  MCV 95.3 96.0  PLT 120* 91*   Cardiac Enzymes:  Recent Labs Lab 10/21/16 0310 10/21/16 0850 10/21/16 1529  TROPONINI <0.03 <0.03 0.03*    CBG: No results for input(s): GLUCAP in the  last 168 hours.  Recent Results (from the past 240 hour(s))  C difficile quick scan w PCR reflex     Status: None   Collection Time: 10/21/16  4:44 PM  Result Value Ref Range Status   C Diff antigen NEGATIVE NEGATIVE Final   C Diff toxin NEGATIVE NEGATIVE Final   C Diff interpretation No C. difficile detected.  Final     Studies: Dg Chest 2 View  Result Date: 10/20/2016 CLINICAL DATA:  Mid and upper abdominal pain associated with nausea and vomiting and abdominal distension. Symptoms increasing over the past 4 days. Also shortness of breath and hypoxia. History of right lung malignancy, COPD, former smoker. EXAM: CHEST  2 VIEW COMPARISON:  CT scan of the chest of July 02, 2016 and portable chest x-ray of April 04, 2015. FINDINGS: The lungs are adequately inflated. The interstitial markings remain increased in the right mid and lower lung and in the left mid lung. There is no alveolar infiltrate or pleural effusion. The heart and pulmonary vascularity are normal. There is calcification in the wall of the aortic arch. The porta catheter tip projects over the proximal SVC. There is multilevel degenerative disc disease of the thoracic spine. The gas pattern in the upper abdomen is unremarkable. IMPRESSION: Chronic interstitial changes bilaterally. Those on the right are likely due to previous radiation therapy. On the left may may reflect scarring. Bullous lesions in the apices. No pneumonia nor CHF. Thoracic aortic atherosclerosis. Given the patient's abdominal symptoms, supine and upright abdominal radiographs may be useful. Electronically Signed   By: David  [place] M.D.   On: 10/20/2016 09:50   Ct Abdomen Pelvis W Contrast  Result Date: 10/20/2016 CLINICAL DATA:  Abdominal pain and distention. History of obstruction. History of lung cancer, hernia repair, and abdominal aortic aneurysm repair. EXAM: CT ABDOMEN AND PELVIS WITH CONTRAST TECHNIQUE: Multidetector CT imaging of the abdomen and pelvis  was performed using the standard protocol following bolus administration of intravenous contrast. CONTRAST:  100 mL Isovue-300 COMPARISON:  01/13/2016 FINDINGS: Lower chest: Centrilobular emphysema and subsegmental atelectasis in the lung bases. No pleural effusion. Three-vessel coronary artery atherosclerosis. Normal heart size. Hepatobiliary: Morphologic changes suggestive of cirrhosis are again noted including a nodular liver contour and enlargement of the lateral segment of the left hepatic lobe. No focal liver abnormality is identified. A 6 mm stone is again noted in the gallbladder which is moderately distended but without evidence of wall thickening or pericholecystic inflammation. No biliary dilatation. Pancreas: Unremarkable. Spleen: No focal abnormality. Decreased splenic volume since the prior study. Adrenals/Urinary Tract: Unremarkable adrenal glands. No evidence of renal mass, calculi, or hydronephrosis. Unremarkable bladder. Stomach/Bowel: Moderate distention of the stomach by fluid and gas. There is moderate diffuse proximal small bowel dilatation with loops measuring up to 5 cm in diameter and containing multiple air-fluid levels. There is a transition point in the central abdomen at the site of a small bowel anastomosis (  series 2, image 66) with more distal small bowel being decompressed. There is diffuse colonic diverticulosis without evidence of diverticulitis. The appendix is unremarkable. Vascular/Lymphatic: Moderately advanced atherosclerosis involving the abdominal aorta and its major branch vessels. Prior infrarenal abdominal aortic aneurysm repair. The aorta measures up to 3.3 cm in AP diameter, unchanged. 10 mm portacaval and 9 mm periportal/peripancreatic lymph nodes are unchanged and likely reactive due to chronic liver disease. Reproductive: Mildly enlarged prostate. Other: Unchanged small fat containing right inguinal hernia. Trace perihepatic fluid. Musculoskeletal: Bilateral L5 pars  defects with grade 1 anterolisthesis, unchanged. Multilevel lumbar disc degeneration, greatest at L2-3. IMPRESSION: 1. Small bowel obstruction with transition point in the central abdomen at a small bowel anastomosis. 2. Aortic atherosclerosis.  Prior abdominal aortic aneurysm repair. 3. Morphologic changes of cirrhosis.  Trace perihepatic fluid. 4. Cholelithiasis. Electronically Signed   By: Logan Bores M.D.   On: 10/20/2016 10:37   Dg Abd 2 Views  Result Date: 10/21/2016 CLINICAL DATA:  76 y/o  M; bowel obstruction and diarrhea. EXAM: ABDOMEN - 2 VIEW COMPARISON:  10/20/2016 CT abdomen and pelvis FINDINGS: Dilated loops of small bowel with fluid levels compatible with bowel obstruction. Retained contrast in the bladder from prior CT. Degenerative changes of the lower lumbar spine. No pneumoperitoneum. IMPRESSION: Small bowel obstruction.  No pneumoperitoneum. Electronically Signed   By: Kristine Garbe M.D.   On: 10/21/2016 15:00    Scheduled Meds: . enoxaparin (LOVENOX) injection  40 mg Subcutaneous Q24H  . levalbuterol  0.63 mg Nebulization BID  . levothyroxine  87.5 mcg Intravenous Daily  . metoprolol  5 mg Intravenous Q6H  . sodium chloride  500 mL Intravenous Once  . sodium chloride flush  10-40 mL Intracatheter Q12H  . sodium chloride flush  3 mL Intravenous Q12H  . tiotropium  18 mcg Inhalation Daily   Continuous Infusions: . sodium chloride 75 mL/hr at 10/20/16 1809    Active Problems:   COPD (chronic obstructive pulmonary disease) (HCC)   Atrial fibrillation (HCC)   Coronary artery disease due to lipid rich plaque   Essential hypertension   Small bowel obstruction   AKI (acute kidney injury) ([place])   Acute respiratory failure with hypoxia ([place])    Time spent: [place], [facility] Pager [phone]. If 7PM-7AM, please contact night-coverage at [email or web address], password [facility] 10/21/2016, 9:34 PM  LOS: 1 day

## 2016-10-21 NOTE — Progress Notes (Addendum)
Notified KBaltazar Najjar of Heart Rate 130-157, VS 99/74, HR 160. Patient remains in Afib RVR, per EKG and monitor. Patient denies chest pain, shortness of breath and asymptomatic.  Patient continues to have frequent small liquid stool. Denies abdominal pain and reports passing gas.  [facility] RN

## 2016-10-21 NOTE — Progress Notes (Signed)
Central monitoring called to inform nurse that patient had a 7 beats of vtach at 0111, and now patient is in afi accelerated at 150-160 with eEKG completed shows AFib RVR paged mid level.

## 2016-10-21 NOTE — Progress Notes (Signed)
OT Cancellation Note  Patient Details Name: Joseph [name] MRN: [id number] DOB: October 15, 1940   Cancelled Treatment:    Reason Eval/Treat Not Completed: Patient not medically ready - increased HR, low BP, awaiting VQ scanREDDING, Thereasa Parkin 10/21/2016, 12:05 PM

## 2016-10-22 ENCOUNTER — Inpatient Hospital Stay (HOSPITAL_COMMUNITY): Payer: PPO

## 2016-10-22 DIAGNOSIS — N179 Acute kidney failure, unspecified: Secondary | ICD-10-CM

## 2016-10-22 DIAGNOSIS — J438 Other emphysema: Secondary | ICD-10-CM

## 2016-10-22 LAB — BASIC METABOLIC PANEL
Anion gap: 4 — ABNORMAL LOW (ref 5–15)
BUN: 35 mg/dL — ABNORMAL HIGH (ref 6–20)
CHLORIDE: 112 mmol/L — AB (ref 101–111)
CO2: 23 mmol/L (ref 22–32)
CREATININE: 1.01 mg/dL (ref 0.61–1.24)
Calcium: 7.7 mg/dL — ABNORMAL LOW (ref 8.9–10.3)
GFR calc non Af Amer: >60 mL/min (ref >60)
Glucose, Bld: 85 mg/dL (ref 65–99)
Potassium: 3.5 mmol/L (ref 3.5–5.1)
Sodium: 139 mmol/L (ref 135–145)

## 2016-10-22 LAB — MAGNESIUM: Magnesium: 2 mg/dL (ref 1.7–2.4)

## 2016-10-22 MED ORDER — TECHNETIUM TO 99M ALBUMIN AGGREGATED
4.1000 | Freq: Once | INTRAVENOUS | Status: AC | PRN
  Administered 2016-10-22: 4.1 via INTRAVENOUS

## 2016-10-22 MED ORDER — TECHNETIUM TC 99M DIETHYLENETRIAME-PENTAACETIC ACID
30.1000 | Freq: Once | INTRAVENOUS | Status: AC | PRN
  Administered 2016-10-22: 30.1 via INTRAVENOUS

## 2016-10-22 NOTE — Progress Notes (Signed)
PT Cancellation Note  Patient Details Name: Joseph [name] MRN: [id number] DOB: Sep 26, 1940   Cancelled Treatment:    Reason Eval/Treat Not Completed: Patient at procedure or test/unavailable Pt out of room earlier for V/Q scan, results pending, and also new chest xray order per RN.  Will check back tomorrow.   Ronnica Dreese,KATHrine E 10/22/2016, 3:16 PM Carmelia Bake, PT, DPT 10/22/2016 Pager: [phone]

## 2016-10-22 NOTE — Progress Notes (Signed)
[facility] PROGRESS NOTE  Joseph [name] DTO:[id number] DOB: 1941-06-11 DOA: 10/20/2016 PCP: [name], MD  Interim summary and HPI 76 y.o. male with medical history of COPD, paroxysmal atrial fibrillation, coronary artery disease, non-small cell lung cancer, and previous small bowel obstruction presents with 3 day history of abdominal pain and distention and one day of nausea, vomiting. Found to have SBO most likely from adhesions.  Assessment/Plan: 1-SBO:most likely due to adhesions -unable to have NGT placed -but with conservative management and NPO status, has had significant clinically improved. -no abd pain, no nausea and no vomiting currently. -per CCS will advance diet to full liquid  -abd x-ray from 2/21 still with dilated loops and fluid levels; but with significant clinical improvement. -continue electrolytes repletion -follow clinical response -continue IV meds route for now  2-dehydration and AKI: due to GI loses and poor PO intake -will continue IV fluids; adjust rate, as patient tolerating CLD and will be started on full liquid  -Cr 1.01 today -no signs of UTI  3-acute resp failure with hypoxia: -due to A. Fib and abdominal distension -improved now that his abd is less distended and HR better controlled -will start weaning oxygen off  4-paroxysmal A. Fib -CHADsVASC score 4 -will continue IV metprolol -follow electrolytes (K and Mg and replete them as needed) -continue telemetry monitoring   5-elevated D-dimer and troponin -chronically with elevated D-dimer; even higher this time, not complaining of CP or SOB. -will pursuit V/Q scan (orderd today). Currently w/o any symptoms suggesting PE. -troponin milddly elevated from A. Fib most likely -no CP and no abnormalities seen on telemetry -will monitor -continue B-blocker   6-hypothyroidism  -will continue synthroid   7-HTN -BP is stable -continue metoprolol  8-Non-small cell lung cancer  stage IIIa/IIIB -Presently in remission -Follows Dr. Julien Nordmann  9-Chronic diastolic CHF -Appears dehydrated clinically -Holding furosemide for now -Daily weights and strict I's and O's    Code Status: Full Family Communication: no family at bedside  Disposition Plan: remains inpatient; will have diet advance to full liquid; continue IV meds for another 24 hours. If tolerated will transition to PO tomorrow. Replete electrolytes and increase physical activity.   Consultants:  CCS  Procedures:  See below for x-ray reports   Antibiotics:  None   HPI/Subjective: Afebrile, no CP, no abd pain. Reporting having still BM's and passing gas. No nausea, no vomiting and tolerating CLD.  Objective: Vitals:   10/22/16 1144 10/22/16 1500  BP: 128/74 111/74  Pulse: 92 83  Resp:    Temp:  97.6 F (36.4 C)    Intake/Output Summary (Last 24 hours) at 10/22/16 1607 Last data filed at 10/22/16 1500  Gross per 24 hour  Intake             2405 ml  Output                0 ml  Net             2405 ml   Filed Weights   10/20/16 2117  Weight: 98 kg (216 lb)    Exam:   General:  Afebrile, denies CP and SOB. Patient w/o nausea or vomiting. Continue to have BM's; but reported they are more formed.   Cardiovascular: irregular, no rubs, no gallops, no JVD; rate controlled today  Respiratory: no crackles, no wheezing, good air movement   Abdomen: soft, slightly distended, no tenderness; positive BS appreciated  Musculoskeletal: trace edema, no cyanosis    Data  Reviewed: Basic Metabolic Panel:  Recent Labs Lab 10/20/16 0852 10/21/16 0310 10/22/16 0359  NA 136 137 139  K 4.5 4.1 3.5  CL 101 107 112*  CO2 23 20* 23  GLUCOSE 129* 92 85  BUN 27* 39* 35*  CREATININE 1.33* 1.56* 1.01  CALCIUM 10.1 8.6* 7.7*  MG  --  1.5* 2.0  PHOS  --  3.7  --    Liver Function Tests:  Recent Labs Lab 10/20/16 0852  AST 44*  ALT 30  ALKPHOS 114  BILITOT 3.0*  PROT 7.5  ALBUMIN 3.8    CBC:  Recent Labs Lab 10/20/16 0852 10/21/16 0310  WBC 5.3 5.9  NEUTROABS 2.9 3.6  HGB 17.3* 15.3  HCT 48.4 43.4  MCV 95.3 96.0  PLT 120* 91*   Cardiac Enzymes:  Recent Labs Lab 10/21/16 0310 10/21/16 0850 10/21/16 1529  TROPONINI <0.03 <0.03 0.03*    CBG: No results for input(s): GLUCAP in the last 168 hours.  Recent Results (from the past 240 hour(s))  C difficile quick scan w PCR reflex     Status: None   Collection Time: 10/21/16  4:44 PM  Result Value Ref Range Status   C Diff antigen NEGATIVE NEGATIVE Final   C Diff toxin NEGATIVE NEGATIVE Final   C Diff interpretation No C. difficile detected.  Final     Studies: Dg Abd 2 Views  Result Date: 10/21/2016 CLINICAL DATA:  76 y/o  M; bowel obstruction and diarrhea. EXAM: ABDOMEN - 2 VIEW COMPARISON:  10/20/2016 CT abdomen and pelvis FINDINGS: Dilated loops of small bowel with fluid levels compatible with bowel obstruction. Retained contrast in the bladder from prior CT. Degenerative changes of the lower lumbar spine. No pneumoperitoneum. IMPRESSION: Small bowel obstruction.  No pneumoperitoneum. Electronically Signed   By: Kristine Garbe M.D.   On: 10/21/2016 15:00    Scheduled Meds: . enoxaparin (LOVENOX) injection  40 mg Subcutaneous Q24H  . levalbuterol  0.63 mg Nebulization BID  . levothyroxine  87.5 mcg Intravenous Daily  . metoprolol  5 mg Intravenous Q6H  . sodium chloride  500 mL Intravenous Once  . sodium chloride flush  10-40 mL Intracatheter Q12H  . sodium chloride flush  3 mL Intravenous Q12H  . tiotropium  18 mcg Inhalation Daily   Continuous Infusions: . sodium chloride 75 mL/hr at 10/22/16 1145    Active Problems:   COPD (chronic obstructive pulmonary disease) (HCC)   Atrial fibrillation (HCC)   Coronary artery disease due to lipid rich plaque   Essential hypertension   Small bowel obstruction   AKI (acute kidney injury) ([place])   Acute respiratory failure with hypoxia  ([place])    Time spent: [place], [place] [facility] Pager [phone]. If 7PM-7AM, please contact night-coverage at [email or web address], password [facility] 10/22/2016, 4:07 PM  LOS: 2 days

## 2016-10-22 NOTE — Progress Notes (Signed)
Subjective: Tolerating clear liquids.  Has had some more BM's, a little formed. No nausea or abdominal pain. Wife in the room.  Objective: Vital signs in last 24 hours: Temp:  [98.5 F (36.9 C)-99.1 F (37.3 C)] 98.5 F (36.9 C) (02/22 0631) Pulse Rate:  [84-155] 84 (02/22 0631) Resp:  [16-18] 18 (02/22 0631) BP: (82-103)/(48-65) 96/64 (02/22 0631) SpO2:  [97 %-98 %] 98 % (02/22 0853) FiO2 (%):  [94 %] 94 % (02/21 1950) Last BM Date: 10/21/16  Intake/Output from previous day: 02/21 0701 - 02/22 0700 In: 1995 [P.O.:120; I.V.:1875] Out: 350 [Urine:350] Intake/Output this shift: Total I/O In: 320 [P.O.:320] Out: -   General appearance: WN older male.  Alert, cooperative and no distress GI: soft, non-tender; bowel sounds normal; no masses,  Long mid line scar.  Lab Results:   Recent Labs  10/20/16 0852 10/21/16 0310  WBC 5.3 5.9  HGB 17.3* 15.3  HCT 48.4 43.4  PLT 120* 91*    BMET  Recent Labs  10/21/16 0310 10/22/16 0359  NA 137 139  K 4.1 3.5  CL 107 112*  CO2 20* 23  GLUCOSE 92 85  BUN 39* 35*  CREATININE 1.56* 1.01  CALCIUM 8.6* 7.7*   PT/INR No results for input(s): LABPROT, INR in the last 72 hours.   Recent Labs Lab 10/20/16 0852  AST 44*  ALT 30  ALKPHOS 114  BILITOT 3.0*  PROT 7.5  ALBUMIN 3.8     Lipase     Component Value Date/Time   LIPASE 26 07/04/2014 1858     Studies/Results: Dg Abd 2 Views  Result Date: 10/21/2016 CLINICAL DATA:  76 y/o  M; bowel obstruction and diarrhea. EXAM: ABDOMEN - 2 VIEW COMPARISON:  10/20/2016 CT abdomen and pelvis FINDINGS: Dilated loops of small bowel with fluid levels compatible with bowel obstruction. Retained contrast in the bladder from prior CT. Degenerative changes of the lower lumbar spine. No pneumoperitoneum. IMPRESSION: Small bowel obstruction.  No pneumoperitoneum. Electronically Signed   By: Kristine Garbe M.D.   On: 10/21/2016 15:00    Medications: . enoxaparin  (LOVENOX) injection  40 mg Subcutaneous Q24H  . levalbuterol  0.63 mg Nebulization BID  . levothyroxine  87.5 mcg Intravenous Daily  . metoprolol  5 mg Intravenous Q6H  . sodium chloride  500 mL Intravenous Once  . sodium chloride flush  10-40 mL Intracatheter Q12H  . sodium chloride flush  3 mL Intravenous Q12H  . tiotropium  18 mcg Inhalation Daily   . sodium chloride 75 mL/hr at 10/21/16 2147    Assessment/Plan Recurrent small bowel obstruction  S/PAAA repair, abdominal dehiscence 2015\small bowel resection and hernia repair 07/10/14, ventral hernia repair 04/2015 [place] [facility])  Had loose stools - C. Diff negative  To advance diet.  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  No surgical intervention. CAD/CHF History of AF last admit with small bowel obstruction. History of cirrhosis Hypothyroid Peripheral vascular disease - status post abdominal aortic aneurysm repair (2015) FEN:  IV fluids + clear liquids ID: No antibiotics DVT:  Lovenox   Plan:  Discussed with Dr. Dyann Kief  Clinically looks better.  To advance to full liquids.  If tolerated, advance to reg diet tomorrow.  At this time, no reason for further x-rays.  I spent a fair amount of time talking to patient and wife about the longer term risk of recurrent SBO.    LOS: 2 days    Kynisha Memon  H 10/22/2016 Pager: [phone] Office phone:  [phone]

## 2016-10-22 NOTE — Progress Notes (Signed)
OT Cancellation Note  Patient Details Name: Joseph [name] MRN: [id number] DOB: July 07, 1941   Cancelled Treatment:    Reason Eval/Treat Not Completed: Patient at procedure or test/ unavailable  Pt was in x ray.  Will check on pt later or next day  Kari Baars, [place]  Payton Mccallum D 10/22/2016, 1:27 PM

## 2016-10-23 ENCOUNTER — Encounter (HOSPITAL_COMMUNITY): Payer: Self-pay | Admitting: Radiology

## 2016-10-23 ENCOUNTER — Inpatient Hospital Stay (HOSPITAL_COMMUNITY): Payer: PPO

## 2016-10-23 DIAGNOSIS — I5032 Chronic diastolic (congestive) heart failure: Secondary | ICD-10-CM

## 2016-10-23 LAB — CBC
HCT: 35.3 % — ABNORMAL LOW (ref 39.0–52.0)
HEMOGLOBIN: 12.3 g/dL — AB (ref 13.0–17.0)
MCH: 33.5 pg (ref 26.0–34.0)
MCHC: 34.8 g/dL (ref 30.0–36.0)
MCV: 96.2 fL (ref 78.0–100.0)
PLATELETS: 158 10*3/uL (ref 150–400)
RBC: 3.67 MIL/uL — ABNORMAL LOW (ref 4.22–5.81)
RDW: 13.7 % (ref 11.5–15.5)
WBC: 4.4 10*3/uL (ref 4.0–10.5)

## 2016-10-23 LAB — BASIC METABOLIC PANEL
ANION GAP: 3 — AB (ref 5–15)
BUN: 18 mg/dL (ref 6–20)
CALCIUM: 7.6 mg/dL — AB (ref 8.9–10.3)
CO2: 25 mmol/L (ref 22–32)
Chloride: 109 mmol/L (ref 101–111)
Creatinine, Ser: 0.76 mg/dL (ref 0.61–1.24)
GFR calc Af Amer: >60 mL/min (ref >60)
GLUCOSE: 93 mg/dL (ref 65–99)
Potassium: 4.2 mmol/L (ref 3.5–5.1)
Sodium: 137 mmol/L (ref 135–145)

## 2016-10-23 LAB — MAGNESIUM: MAGNESIUM: 1.9 mg/dL (ref 1.7–2.4)

## 2016-10-23 MED ORDER — LEVOTHYROXINE SODIUM 75 MCG PO TABS
175.0000 ug | ORAL_TABLET | Freq: Every day | ORAL | Status: DC
  Administered 2016-10-24 – 2016-10-25 (×2): 175 ug via ORAL
  Filled 2016-10-23 (×2): qty 1

## 2016-10-23 MED ORDER — IOPAMIDOL (ISOVUE-370) INJECTION 76%
INTRAVENOUS | Status: AC
  Administered 2016-10-23: 53 mL
  Filled 2016-10-23: qty 100

## 2016-10-23 MED ORDER — METOPROLOL TARTRATE 25 MG PO TABS
25.0000 mg | ORAL_TABLET | Freq: Two times a day (BID) | ORAL | Status: DC
  Administered 2016-10-23 – 2016-10-25 (×4): 25 mg via ORAL
  Filled 2016-10-23 (×4): qty 1

## 2016-10-23 MED ORDER — METOPROLOL TARTRATE 25 MG PO TABS: 25.0000 mg | ORAL_TABLET | Freq: Two times a day (BID) | ORAL | Status: DC

## 2016-10-23 NOTE — Progress Notes (Signed)
[facility] PROGRESS NOTE  Joseph [name]:[id number] DOB: 1940-09-07 DOA: 10/20/2016 PCP: [name], MD  Interim summary and HPI 76 y.o. male with medical history of COPD, paroxysmal atrial fibrillation, coronary artery disease, non-small cell lung cancer, and previous small bowel obstruction presents with 3 day history of abdominal pain and distention and one day of nausea, vomiting. Found to have SBO most likely from adhesions.  Assessment/Plan: 1-SBO:most likely due to adhesions -unable to have NGT placed; but with conservative management and NPO status, has had significant clinically improvement -no abd pain, no nausea and no vomiting currently. -per CCS will advance diet to soft and increase physical activity   -continue electrolytes repletion -follow clinical response -transition meds to PO route now -passing gas actively and with BM's   2-dehydration and AKI: due to GI loses and poor PO intake -will advance to soft diet -adjust IVF's rate -Cr  WNL now; 0.76 today -no signs of UTI -will repeat BMET in am, since received contrast with CTA  3-acute resp failure with hypoxia: -due to A. Fib and abdominal distension most likely -improved now that his abd is no distended and HR controlled -completely off oxygen supplementation now.  4-paroxysmal A. Fib -CHADsVASC score 4 -will resume PO metoprolol for rate control -follow electrolytes (K and Mg and replete them as needed) -continue telemetry monitoring   5-elevated D-dimer and troponin -chronically with elevated D-dimer; even higher this time, not complaining of CP or SOB currently; but presented with hypoxia.Marland Kitchen -V/Q scan intermediate probability; CT angio chest neg for PE -troponin milddly elevated from A. Fib most likely -no CP and no abnormalities seen on telemetry/EKG suggesting acute ischemia -will monitor -continue B-blocker for rate controlled  6-hypothyroidism  -will continue synthroid; now  PO  7-HTN -BP is stable -continue metoprolol, now PO  8-Non-small cell lung cancer stage IIIa/IIIB -Presently in remission -Follows Dr. Julien Nordmann  9-Chronic diastolic CHF -Appears dehydrated clinically -Holding furosemide for now -Daily weights and strict I's and O's    Code Status: Full Family Communication: no family at bedside  Disposition Plan: remains inpatient; will have diet advance to soft; transition medications to PO. Replete electrolytes and increase physical activity. Hopefully home in am.   Consultants:  CCS  Procedures:  See below for x-ray reports   Antibiotics:  None   HPI/Subjective: Afebrile, no CP, no abd pain. Reporting no nausea, no vomiting and tolerating full liquid diet..  Objective: Vitals:   10/23/16 1100 10/23/16 1315  BP:  123/70  Pulse: 92 94  Resp:  18  Temp:  98 F (36.7 C)    Intake/Output Summary (Last 24 hours) at 10/23/16 1354 Last data filed at 10/23/16 1200  Gross per 24 hour  Intake             2750 ml  Output              200 ml  Net             2550 ml   Filed Weights   10/20/16 2117  Weight: 98 kg (216 lb)    Exam:   General:  Afebrile, denies CP and SOB. Patient w/o nausea or vomiting. Has tolerated full liquid diet. No abd pain.  Cardiovascular: irregular, no rubs, no gallops, no JVD; rate controlled today  Respiratory: no crackles, no wheezing, good air movement   Abdomen: soft, no tenderness; positive BS appreciated  Musculoskeletal: trace edema, no cyanosis    Data Reviewed: Basic Metabolic Panel:  Recent Labs Lab 10/20/16 0852 10/21/16 0310 10/22/16 0359 10/23/16 0353  NA 136 137 139 137  K 4.5 4.1 3.5 4.2  CL 101 107 112* 109  CO2 23 20* 23 25  GLUCOSE 129* 92 85 93  BUN 27* 39* 35* 18  CREATININE 1.33* 1.56* 1.01 0.76  CALCIUM 10.1 8.6* 7.7* 7.6*  MG  --  1.5* 2.0 1.9  PHOS  --  3.7  --   --    Liver Function Tests:  Recent Labs Lab 10/20/16 0852  AST 44*  ALT 30  ALKPHOS  114  BILITOT 3.0*  PROT 7.5  ALBUMIN 3.8   CBC:  Recent Labs Lab 10/20/16 0852 10/21/16 0310 10/23/16 0353  WBC 5.3 5.9 4.4  NEUTROABS 2.9 3.6  --   HGB 17.3* 15.3 12.3*  HCT 48.4 43.4 35.3*  MCV 95.3 96.0 96.2  PLT 120* 91* 158   Cardiac Enzymes:  Recent Labs Lab 10/21/16 0310 10/21/16 0850 10/21/16 1529  TROPONINI <0.03 <0.03 0.03*    CBG: No results for input(s): GLUCAP in the last 168 hours.  Recent Results (from the past 240 hour(s))  C difficile quick scan w PCR reflex     Status: None   Collection Time: 10/21/16  4:44 PM  Result Value Ref Range Status   C Diff antigen NEGATIVE NEGATIVE Final   C Diff toxin NEGATIVE NEGATIVE Final   C Diff interpretation No C. difficile detected.  Final     Studies: Dg Chest 2 View  Result Date: 10/22/2016 CLINICAL DATA:  Shortness of breath. EXAM: CHEST  2 VIEW COMPARISON:  10/20/2016.  CT 07/02/2016. FINDINGS: Port-A-Cath in stable position. Heart size stable. Chronic bilateral interstitial changes consistent chronic interstitial lung disease again noted. Stable bullous changes and pleural-parenchymal thickening consistent with scarring noted. No pneumothorax. Stable elevation left hemidiaphragm. IMPRESSION: 1. Port-A-Cath in stable position. 2. Chronic interstitial lung disease, bullous COPD, pleural-parenchymal scarring. Lung changes are stable from prior exam. No acute cardiopulmonary disease. Electronically Signed   By: Marcello Moores  Register   On: 10/22/2016 16:18   Ct Angio Chest Pe W Or Wo Contrast  Result Date: 10/23/2016 CLINICAL DATA:  Paroxysmal atrial fibrillation, elevated D-dimer,, possible PE, history of non small cell lung cancer EXAM: CT ANGIOGRAPHY CHEST WITH CONTRAST TECHNIQUE: Multidetector CT imaging of the chest was performed using the standard protocol during bolus administration of intravenous contrast. Multiplanar CT image reconstructions and MIPs were obtained to evaluate the vascular anatomy. CONTRAST:  57  cc Isovue COMPARISON:  CT scan of the chest 07/02/2016 FINDINGS: Cardiovascular: Heart size within normal limits. No pericardial effusion. Atherosclerotic calcifications of coronary arteries again noted. Atherosclerotic calcifications of thoracic aorta. No evidence of aortic aneurysm. The study is diagnostic.  No evidence of pulmonary embolus. Mediastinum/Nodes: No mediastinal hematoma or adenopathy. No hilar adenopathy. Visualized esophagus is unremarkable. Lungs/Pleura: Again noted bilateral emphysematous changes most significant in upper lobes. There is small left pleural effusion. There is small atelectasis or infiltrate in left lower lobe posterior medially. Mild bronchitic changes are noted in left lower lobe posterior medially. Small atelectasis noted bilateral upper lobe just anterior to major fissure. Stable postradiation fibrotic changes in medial aspect of the right upper lobe. Stable 9 mm nodule basilar in right upper lobe axial image 61 lung windows. There is no pulmonary edema. No new pulmonary nodules are noted. No definite recurrent mass. Upper Abdomen: Visualized upper abdomen shows again micro nodular liver contour probable due to cirrhosis. No adrenal gland mass. Visualized spleen  and pancreas is unremarkable. There is a again noted 6 mm calcified gallstone within fundal gallbladder. Musculoskeletal: Sagittal images of the spine shows diffuse osteopenia. Stable mild degenerative changes thoracic spine. Review of the MIP images confirms the above findings. IMPRESSION: 1. No pulmonary embolus is identified. 2. There is small left pleural effusion. Small atelectasis or infiltrate in left lower lobe posterior medially. Mild bronchitic changes are noted in left lower lobe axial image 56. 3. Again noted bilateral emphysematous changes. Stable postradiation changes and fibrotic changes in right upper lobe posterior medially. No definite evidence of recurrent tumor. No pulmonary edema. 4. Atherosclerotic  calcifications of thoracic aorta and coronary arteries. No evidence of aortic aneurysm. 5. Osteopenia and mild degenerative changes thoracic spine. 6. Stable 9 mm nodule in right upper lobe basilar just adjacent to minor fissure Electronically Signed   By: Lahoma Crocker M.D.   On: 10/23/2016 13:15   Nm Pulmonary Perf And Vent  Result Date: 10/22/2016 CLINICAL DATA:  Shortness of breath, history lung cancer EXAM: NUCLEAR MEDICINE VENTILATION - PERFUSION LUNG SCAN TECHNIQUE: Ventilation images were obtained in multiple projections using inhaled aerosol [id number]. Perfusion images were obtained in multiple projections after intravenous injection of Tc-932mAA. RADIOPHARMACEUTICALS:  30.1 mCi Technetium-998mPA aerosol inhalation and 4.1 mCi Technetium-57m35m IV COMPARISON:  None Chest radiograph:  10/22/2016 FINDINGS: Ventilation: Central airway deposition of aerosol. Diminished ventilation throughout LEFT lower lobe and lingula. Areas of diminished ventilation in both upper lobes. Perfusion: Subsegmental perfusion defects lateral RIGHT upper lobe, matching. Diffusely diminished perfusion at RIGHT apex corresponding to severe bullous disease on chest radiograph. More focally diminished perfusion throughout LEFT lower lobe and lingula with overall diffusely less perfusion to LEFT lung than RIGHT. Chest radiograph: Increased atelectasis versus infiltrate at LEFT lung base involving probably the lower lobe and lingula. Severe bullous disease at the RIGHT apex with significant emphysematous changes in LEFT upper lobe as well. IMPRESSION: Abnormal areas of matching diminished ventilation and perfusion in both upper lobes, corresponding to severe COPD changes on chest radiography. Additional matching ventilation, perfusion and radiographic abnormalities in the lower LEFT lung. The presence of significant triple matching abnormalities represents an intermediate probability for pulmonary embolism. Electronically Signed    By: MarkLavonia Dana.   On: 10/22/2016 16:08   Dg Abd 2 Views  Result Date: 10/21/2016 CLINICAL DATA:  75 y92  M; bowel obstruction and diarrhea. EXAM: ABDOMEN - 2 VIEW COMPARISON:  10/20/2016 CT abdomen and pelvis FINDINGS: Dilated loops of small bowel with fluid levels compatible with bowel obstruction. Retained contrast in the bladder from prior CT. Degenerative changes of the lower lumbar spine. No pneumoperitoneum. IMPRESSION: Small bowel obstruction.  No pneumoperitoneum. Electronically Signed   By: LancKristine Garbe.   On: 10/21/2016 15:00    Scheduled Meds: . enoxaparin (LOVENOX) injection  40 mg Subcutaneous Q24H  . levalbuterol  0.63 mg Nebulization BID  . [START ON 10/24/2016] levothyroxine  175 mcg Oral QAC breakfast  . metoprolol tartrate  25 mg Oral BID  . sodium chloride  500 mL Intravenous Once  . sodium chloride flush  10-40 mL Intracatheter Q12H  . sodium chloride flush  3 mL Intravenous Q12H  . tiotropium  18 mcg Inhalation Daily   Continuous Infusions: . sodium chloride 75 mL/hr at 10/23/16 1317    Active Problems:   COPD (chronic obstructive pulmonary disease) (HCC)   Atrial fibrillation (HCC)   Coronary artery disease due to lipid rich plaque   Essential hypertension  Small bowel obstruction   AKI (acute kidney injury) ([place])   Acute respiratory failure with hypoxia ([place])    Time spent: [place], [place] [facility] Pager [phone]. If 7PM-7AM, please contact night-coverage at [email or web address], password [facility] 10/23/2016, 1:54 PM  LOS: 3 days

## 2016-10-23 NOTE — Evaluation (Signed)
Physical Therapy Evaluation Patient Details Name: Joseph [name] MRN: [id number] DOB: 24-Jul-1941 Today's Date: 10/23/2016   History of Present Illness  76 y.o.malewith medical history of COPD, paroxysmal atrial fibrillation, coronary artery disease, non-small cell lung cancer, and previous small bowel obstruction presents with 3 day history of abdominal pain and distention and one day of nausea, vomiting. Found to have SBO most likely from adhesions.  Clinical Impression  Pt admitted with above diagnosis. Pt currently with functional limitations due to the deficits listed below (see PT Problem List).  Pt will benefit from skilled PT to increase their independence and safety with mobility to allow discharge to the venue listed below.   Pt did well, some high level balance deficits, recommend he use his cane until stronger. Will follow in acute setting     Follow Up Recommendations No PT follow up    Equipment Recommendations  None recommended by PT    Recommendations for Other Services       Precautions / Restrictions Precautions Precautions: Fall Restrictions Weight Bearing Restrictions: No      Mobility  Bed Mobility               General bed mobility comments: oob  Transfers Overall transfer level: Needs assistance Equipment used: 1 person hand held assist Transfers: Sit to/from Stand Sit to Stand: Min guard         General transfer comment: for safety  Ambulation/Gait Ambulation/Gait assistance: Min guard Ambulation Distance (Feet): 120 Feet Assistive device: Rolling walker (2 wheeled) Gait Pattern/deviations: Step-through pattern;Drifts right/left     General Gait Details: no overt LOB although genrerally unsteady gait, drifting  Stairs            Wheelchair Mobility    Modified Rankin (Stroke Patients Only)       Balance Overall balance assessment: Needs assistance         Standing balance support: Single extremity  supported Standing balance-Leahy Scale: Fair Standing balance comment: at least fair             High level balance activites: Head turns;Turns               Pertinent Vitals/Pain Pain Assessment: No/denies pain    Home Living Family/patient expects to be discharged to:: Private residence Living Arrangements: Spouse/significant other Available Help at Discharge: Family Type of Home: House Home Access: Stairs to enter   Technical brewer of Steps: 1-2 Home Layout: One level Home Equipment: Bedside commode;Cane - single point;Shower seat      Prior Function Level of Independence: Independent;Independent with assistive device(s)         Comments: occasionally amb with cane d/t knee pain     Hand Dominance   Dominant Hand: Right    Extremity/Trunk Assessment   Upper Extremity Assessment Upper Extremity Assessment: Overall WFL for tasks assessed    Lower Extremity Assessment Lower Extremity Assessment: Overall WFL for tasks assessed;RLE deficits/detail;LLE deficits/detail RLE Coordination: decreased gross motor LLE Coordination: decreased gross motor       Communication   Communication: No difficulties  Cognition Arousal/Alertness: Awake/alert Behavior During Therapy: WFL for tasks assessed/performed Overall Cognitive Status: Within Functional Limits for tasks assessed                      General Comments      Exercises     Assessment/Plan    PT Assessment Patient needs continued PT services  PT Problem List Decreased strength;Decreased range of  motion;Decreased activity tolerance;Decreased mobility       PT Treatment Interventions DME instruction;Gait training;Functional mobility training;Stair training;Therapeutic activities;Therapeutic exercise;Patient/family education    PT Goals (Current goals can be found in the Care Plan section)  Acute Rehab PT Goals Patient Stated Goal: none stated PT Goal Formulation: With  patient Time For Goal Achievement: 10/30/16 Potential to Achieve Goals: Good    Frequency Min 3X/week   Barriers to discharge        Co-evaluation PT/OT/SLP Co-Evaluation/Treatment: Yes Reason for Co-Treatment: For patient/therapist safety PT goals addressed during session: Mobility/safety with mobility OT goals addressed during session: ADL's and self-care       End of Session Equipment Utilized During Treatment: Gait belt;Oxygen Activity Tolerance: Patient tolerated treatment well Patient left: in chair;with call bell/phone within reach;with family/visitor present Nurse Communication: Mobility status PT Visit Diagnosis: Unsteadiness on feet (R26.81)         Time: 0383-3383 PT Time Calculation (min) (ACUTE ONLY): 16 min   Charges:   PT Evaluation $PT Eval Low Complexity: 1 Procedure     PT G Codes:         Joseph [name] 11/03/2016, 1:24 PM

## 2016-10-23 NOTE — Progress Notes (Addendum)
Patient ID: Joseph [name], male   DOB: Oct 09, 1940, 76 y.o.   MRN: [id number]  [facility] Surgery Progress Note     Subjective: Tolerating full liquids. Passing a large amount of flatus, and he has had a few liquid Bm's. Denies any n/v. States that his abdominal pain and bloating have decreased. Ambulated once this morning, plans to walk again this afternoon.  Objective: Vital signs in last 24 hours: Temp:  [97.6 F (36.4 C)-99.9 F (37.7 C)] 98 F (36.7 C) (02/23 1315) Pulse Rate:  [83-99] 94 (02/23 1315) Resp:  [14-18] 18 (02/23 1315) BP: (109-123)/(66-82) 123/70 (02/23 1315) SpO2:  [94 %-99 %] 95 % (02/23 1315) Last BM Date: 10/23/16  Intake/Output from previous day: 02/22 0701 - 02/23 0700 In: 4098 [P.O.:560; I.V.:675] Out: 200 [Urine:200] Intake/Output this shift: Total I/O In: 2225 [P.O.:500; I.V.:1725] Out: 800 [Urine:800]  PE: Gen:  Alert, NAD, pleasant Pulm:  Effort normal Abd: Soft, mild distension, nontender, +BS, no HSM, well healed midline incision Ext:  No erythema, edema, or tenderness   Lab Results:   Recent Labs  10/21/16 0310 10/23/16 0353  WBC 5.9 4.4  HGB 15.3 12.3*  HCT 43.4 35.3*  PLT 91* 158   BMET  Recent Labs  10/22/16 0359 10/23/16 0353  NA 139 137  K 3.5 4.2  CL 112* 109  CO2 23 25  GLUCOSE 85 93  BUN 35* 18  CREATININE 1.01 0.76  CALCIUM 7.7* 7.6*   PT/INR No results for input(s): LABPROT, INR in the last 72 hours. CMP     Component Value Date/Time   NA 137 10/23/2016 0353   NA 141 07/02/2016 1100   K 4.2 10/23/2016 0353   K 4.5 07/02/2016 1100   CL 109 10/23/2016 0353   CL 109 (H) 08/01/2012 1046   CO2 25 10/23/2016 0353   CO2 26 07/02/2016 1100   GLUCOSE 93 10/23/2016 0353   GLUCOSE 96 07/02/2016 1100   GLUCOSE 93 08/01/2012 1046   BUN 18 10/23/2016 0353   BUN 18.9 07/02/2016 1100   CREATININE 0.76 10/23/2016 0353   CREATININE 1.0 07/02/2016 1100   CALCIUM 7.6 (L) 10/23/2016 0353   CALCIUM 9.3  07/02/2016 1100   PROT 7.5 10/20/2016 0852   PROT 7.5 07/02/2016 1100   ALBUMIN 3.8 10/20/2016 0852   ALBUMIN 3.3 (L) 07/02/2016 1100   AST 44 (H) 10/20/2016 0852   AST 49 (H) 07/02/2016 1100   ALT 30 10/20/2016 0852   ALT 38 07/02/2016 1100   ALKPHOS 114 10/20/2016 0852   ALKPHOS 206 (H) 07/02/2016 1100   BILITOT 3.0 (H) 10/20/2016 0852   BILITOT 1.57 (H) 07/02/2016 1100   GFRNONAA >60 10/23/2016 0353   GFRAA >60 10/23/2016 0353   Lipase     Component Value Date/Time   LIPASE 26 07/04/2014 1858       Studies/Results: Dg Chest 2 View  Result Date: 10/22/2016 CLINICAL DATA:  Shortness of breath. EXAM: CHEST  2 VIEW COMPARISON:  10/20/2016.  CT 07/02/2016. FINDINGS: Port-A-Cath in stable position. Heart size stable. Chronic bilateral interstitial changes consistent chronic interstitial lung disease again noted. Stable bullous changes and pleural-parenchymal thickening consistent with scarring noted. No pneumothorax. Stable elevation left hemidiaphragm. IMPRESSION: 1. Port-A-Cath in stable position. 2. Chronic interstitial lung disease, bullous COPD, pleural-parenchymal scarring. Lung changes are stable from prior exam. No acute cardiopulmonary disease. Electronically Signed   By: [place]   On: 10/22/2016 16:18   Ct Angio Chest Pe W Or Wo Contrast  Result Date: 10/23/2016 CLINICAL DATA:  Paroxysmal atrial fibrillation, elevated D-dimer,, possible PE, history of non small cell lung cancer EXAM: CT ANGIOGRAPHY CHEST WITH CONTRAST TECHNIQUE: Multidetector CT imaging of the chest was performed using the standard protocol during bolus administration of intravenous contrast. Multiplanar CT image reconstructions and MIPs were obtained to evaluate the vascular anatomy. CONTRAST:  57 cc Isovue COMPARISON:  CT scan of the chest 07/02/2016 FINDINGS: Cardiovascular: Heart size within normal limits. No pericardial effusion. Atherosclerotic calcifications of coronary arteries again noted.  Atherosclerotic calcifications of thoracic aorta. No evidence of aortic aneurysm. The study is diagnostic.  No evidence of pulmonary embolus. Mediastinum/Nodes: No mediastinal hematoma or adenopathy. No hilar adenopathy. Visualized esophagus is unremarkable. Lungs/Pleura: Again noted bilateral emphysematous changes most significant in upper lobes. There is small left pleural effusion. There is small atelectasis or infiltrate in left lower lobe posterior medially. Mild bronchitic changes are noted in left lower lobe posterior medially. Small atelectasis noted bilateral upper lobe just anterior to major fissure. Stable postradiation fibrotic changes in medial aspect of the right upper lobe. Stable 9 mm nodule basilar in right upper lobe axial image 61 lung windows. There is no pulmonary edema. No new pulmonary nodules are noted. No definite recurrent mass. Upper Abdomen: Visualized upper abdomen shows again micro nodular liver contour probable due to cirrhosis. No adrenal gland mass. Visualized spleen and pancreas is unremarkable. There is a again noted 6 mm calcified gallstone within fundal gallbladder. Musculoskeletal: Sagittal images of the spine shows diffuse osteopenia. Stable mild degenerative changes thoracic spine. Review of the MIP images confirms the above findings. IMPRESSION: 1. No pulmonary embolus is identified. 2. There is small left pleural effusion. Small atelectasis or infiltrate in left lower lobe posterior medially. Mild bronchitic changes are noted in left lower lobe axial image 56. 3. Again noted bilateral emphysematous changes. Stable postradiation changes and fibrotic changes in right upper lobe posterior medially. No definite evidence of recurrent tumor. No pulmonary edema. 4. Atherosclerotic calcifications of thoracic aorta and coronary arteries. No evidence of aortic aneurysm. 5. Osteopenia and mild degenerative changes thoracic spine. 6. Stable 9 mm nodule in right upper lobe basilar just  adjacent to minor fissure Electronically Signed   By: Lahoma Crocker M.D.   On: 10/23/2016 13:15   Nm Pulmonary Perf And Vent  Result Date: 10/22/2016 CLINICAL DATA:  Shortness of breath, history lung cancer EXAM: NUCLEAR MEDICINE VENTILATION - PERFUSION LUNG SCAN TECHNIQUE: Ventilation images were obtained in multiple projections using inhaled aerosol Tc-15mDTPA. Perfusion images were obtained in multiple projections after intravenous injection of Tc-916mAA. RADIOPHARMACEUTICALS:  30.1 mCi Technetium-9921mPA aerosol inhalation and 4.1 mCi Technetium-91m70m IV COMPARISON:  None Chest radiograph:  10/22/2016 FINDINGS: Ventilation: Central airway deposition of aerosol. Diminished ventilation throughout LEFT lower lobe and lingula. Areas of diminished ventilation in both upper lobes. Perfusion: Subsegmental perfusion defects lateral RIGHT upper lobe, matching. Diffusely diminished perfusion at RIGHT apex corresponding to severe bullous disease on chest radiograph. More focally diminished perfusion throughout LEFT lower lobe and lingula with overall diffusely less perfusion to LEFT lung than RIGHT. Chest radiograph: Increased atelectasis versus infiltrate at LEFT lung base involving probably the lower lobe and lingula. Severe bullous disease at the RIGHT apex with significant emphysematous changes in LEFT upper lobe as well. IMPRESSION: Abnormal areas of matching diminished ventilation and perfusion in both upper lobes, corresponding to severe COPD changes on chest radiography. Additional matching ventilation, perfusion and radiographic abnormalities in the lower LEFT lung. The presence  of significant triple matching abnormalities represents an intermediate probability for pulmonary embolism. Electronically Signed   By: Lavonia Dana M.D.   On: 10/22/2016 16:08   Dg Abd 2 Views  Result Date: 10/21/2016 CLINICAL DATA:  76 y/o  M; bowel obstruction and diarrhea. EXAM: ABDOMEN - 2 VIEW COMPARISON:  10/20/2016 CT  abdomen and pelvis FINDINGS: Dilated loops of small bowel with fluid levels compatible with bowel obstruction. Retained contrast in the bladder from prior CT. Degenerative changes of the lower lumbar spine. No pneumoperitoneum. IMPRESSION: Small bowel obstruction.  No pneumoperitoneum. Electronically Signed   By: Kristine Garbe M.D.   On: 10/21/2016 15:00    Anti-infectives: Anti-infectives    None       Assessment/Plan Recurrent small bowel obstruction             S/P AAA repair, abdominal dehiscence 2015\small bowel resection and hernia repair 07/10/14, ventral hernia repair 04/2015 [facility])             Had loose stools - C. Diff negative             Now on regular food.  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             No surgical intervention. CAD/CHF History of AF last admit with small bowel obstruction. History of cirrhosis Hypothyroid Peripheral vascular disease - status post abdominal aortic aneurysm repair (2015)  FEN:  soft diet ID: No antibiotics DVT:  Lovenox   Plan:  clinically improving and bowel function returning.   Advance to soft diet. Continue to encourage ambulation. Will continue to follow   LOS: 3 days    Jerrye Beavers , [facility] Surgery 10/23/2016, 2:32 PM Pager: [phone] Consults: [phone] Mon-Fri 7:00 am-4:30 pm Sat-Sun 7:00 am-11:30 am  Agree with above. Wife in room. Tolerating reg food.  Some "catching" mid esophagus when he swallows.  CT angio today - neg for PE, small left pleural effusion.  Alphonsa Overall, MD, [facility] Surgery Pager: [phone] Office phone:  [phone]

## 2016-10-23 NOTE — Evaluation (Signed)
Occupational Therapy Evaluation Patient Details Name: Joseph [name] MRN: [id number] DOB: [birth year] Today's Date: 10/23/2016    History of Present Illness 76 y.o.malewith medical history of COPD, paroxysmal atrial fibrillation, coronary artery disease, non-small cell lung cancer, and previous small bowel obstruction presents with 3 day history of abdominal pain and distention and one day of nausea, vomiting. Found to have SBO most likely from adhesions.   Clinical Impression   Pt was admitted for the above.  At baseline, he is independent to mod I with adls/bathroom transfers.  Pt currently needs min guard to min A. Will follow in acute setting with supervision level goals.  Do not anticipate pt will need follow up OT after acute stay    Follow Up Recommendations  Supervision/Assistance - 24 hour    Equipment Recommendations  None recommended by OT    Recommendations for Other Services       Precautions / Restrictions Precautions Precautions: Fall Restrictions Weight Bearing Restrictions: No      Mobility Bed Mobility               General bed mobility comments: oob  Transfers Overall transfer level: Needs assistance Equipment used: 1 person hand held assist Transfers: Sit to/from Stand Sit to Stand: Min guard         General transfer comment: for safety    Balance                                            ADL Overall ADL's : Needs assistance/impaired             Lower Body Bathing: Min guard;Sit to/from stand       Lower Body Dressing: Min guard;Sit to/from stand   Toilet Transfer: Minimal assistance;Ambulation;RW (chair)             General ADL Comments: pt is able to perform UB adls with set up.  Min guard for sit to stand for adls. when ambulating without AD, pt did have a couple of LOB.  He required assistance once to correct.  Sats remained 93-96% on RA     Vision         Perception     Praxis       Pertinent Vitals/Pain Pain Assessment: No/denies pain     Hand Dominance Right   Extremity/Trunk Assessment Upper Extremity Assessment Upper Extremity Assessment: Overall WFL for tasks assessed           Communication Communication Communication: No difficulties   Cognition Arousal/Alertness: Awake/alert Behavior During Therapy: WFL for tasks assessed/performed Overall Cognitive Status: Within Functional Limits for tasks assessed                     General Comments       Exercises       Shoulder Instructions      Home Living Family/patient expects to be discharged to:: Private residence Living Arrangements: Spouse/significant other Available Help at Discharge: Family Type of Home: House Home Access: Stairs to enter Technical brewer of Steps: 1-2   Home Layout: One level     Bathroom Shower/Tub: Occupational psychologist: Handicapped height     Home Equipment: Bedside commode;Cane - single point;Shower seat          Prior Functioning/Environment Level of Independence: Independent;Independent with assistive device(s)  Comments: occasionallyamb with cane d/t knee pain        OT Problem List: Decreased strength;Decreased activity tolerance;Impaired balance (sitting and/or standing)      OT Treatment/Interventions: Self-care/ADL training;DME and/or AE instruction;Patient/family education;Balance training    OT Goals(Current goals can be found in the care plan section) Acute Rehab OT Goals Patient Stated Goal: none stated OT Goal Formulation: With patient Time For Goal Achievement: 10/30/16 Potential to Achieve Goals: Good ADL Goals Pt Will Transfer to Toilet: with supervision;ambulating (high commode) Pt Will Perform Tub/Shower Transfer: with supervision;shower seat;ambulating;Shower transfer  OT Frequency: Min 2X/week   Barriers to D/C:            Co-evaluation              End of Session    Activity  Tolerance: Patient tolerated treatment well Patient left: in chair;with call bell/phone within reach;with family/visitor present  OT Visit Diagnosis: Unsteadiness on feet (R26.81)                ADL either performed or assessed with clinical judgement  Time: 6808-8110 OT Time Calculation (min): 16 min Charges:  OT General Charges $OT Visit: 1 Procedure OT Evaluation $OT Eval Low Complexity: 1 Procedure G-Codes:     Lesle Chris, OTR/L [phone] 10/23/2016  Shamya Macfadden 10/23/2016, 10:46 AM

## 2016-10-24 ENCOUNTER — Inpatient Hospital Stay (HOSPITAL_COMMUNITY): Payer: PPO

## 2016-10-24 DIAGNOSIS — E876 Hypokalemia: Secondary | ICD-10-CM

## 2016-10-24 LAB — BASIC METABOLIC PANEL
ANION GAP: 6 (ref 5–15)
BUN: 9 mg/dL (ref 6–20)
CO2: 24 mmol/L (ref 22–32)
Calcium: 7.8 mg/dL — ABNORMAL LOW (ref 8.9–10.3)
Chloride: 108 mmol/L (ref 101–111)
Creatinine, Ser: 0.7 mg/dL (ref 0.61–1.24)
GFR calc Af Amer: >60 mL/min (ref >60)
GLUCOSE: 93 mg/dL (ref 65–99)
POTASSIUM: 3.4 mmol/L — AB (ref 3.5–5.1)
Sodium: 138 mmol/L (ref 135–145)

## 2016-10-24 LAB — MAGNESIUM: Magnesium: 1.7 mg/dL (ref 1.7–2.4)

## 2016-10-24 MED ORDER — ZOLPIDEM TARTRATE 5 MG PO TABS
5.0000 mg | ORAL_TABLET | Freq: Once | ORAL | Status: AC
  Administered 2016-10-24: 5 mg via ORAL
  Filled 2016-10-24: qty 1

## 2016-10-24 MED ORDER — ADULT MULTIVITAMIN W/MINERALS CH
1.0000 | ORAL_TABLET | Freq: Every day | ORAL | Status: DC
Start: 2016-10-24 — End: 2016-10-25
  Administered 2016-10-24 – 2016-10-25 (×2): 1 via ORAL
  Filled 2016-10-24 (×2): qty 1

## 2016-10-24 MED ORDER — POTASSIUM CHLORIDE CRYS ER 20 MEQ PO TBCR
40.0000 meq | EXTENDED_RELEASE_TABLET | Freq: Once | ORAL | Status: AC
  Administered 2016-10-24: 40 meq via ORAL
  Filled 2016-10-24: qty 2

## 2016-10-24 NOTE — Progress Notes (Signed)
Patient ID: Joseph [name], male   DOB: Apr 12, 1941, 76 y.o.   MRN: [id number]      Subjective: Feels better than admission but still has occasional brief abdominal cramp and occasional nausea without vomiting. He is tolerating a soft diet. Had a loose bowel movement last night.  Objective: Vital signs in last 24 hours: Temp:  [98 F (36.7 C)-99 F (37.2 C)] 99 F (37.2 C) (02/24 0518) Pulse Rate:  [87-100] 87 (02/24 0518) Resp:  [18] 18 (02/24 0518) BP: (122-129)/(70-74) 129/74 (02/24 0518) SpO2:  [93 %-95 %] 93 % (02/24 0518) Last BM Date: 10/24/16  Intake/Output from previous day: 02/23 0701 - 02/24 0700 In: 2800.5 [P.O.:720; I.V.:2080.5] Out: 2650 [Urine:2650] Intake/Output this shift: No intake/output data recorded.  General appearance: alert, cooperative and no distress GI: Seems mildly distended. Nontender. No palpable hernias.  Lab Results:   Recent Labs  10/23/16 0353  WBC 4.4  HGB 12.3*  HCT 35.3*  PLT 158   BMET  Recent Labs  10/23/16 0353 10/24/16 0524  NA 137 138  K 4.2 3.4*  CL 109 108  CO2 25 24  GLUCOSE 93 93  BUN 18 9  CREATININE 0.76 0.70  CALCIUM 7.6* 7.8*     Studies/Results: Dg Chest 2 View  Result Date: 10/22/2016 CLINICAL DATA:  Shortness of breath. EXAM: CHEST  2 VIEW COMPARISON:  10/20/2016.  CT 07/02/2016. FINDINGS: Port-A-Cath in stable position. Heart size stable. Chronic bilateral interstitial changes consistent chronic interstitial lung disease again noted. Stable bullous changes and pleural-parenchymal thickening consistent with scarring noted. No pneumothorax. Stable elevation left hemidiaphragm. IMPRESSION: 1. Port-A-Cath in stable position. 2. Chronic interstitial lung disease, bullous COPD, pleural-parenchymal scarring. Lung changes are stable from prior exam. No acute cardiopulmonary disease. Electronically Signed   By: Marcello Moores  Register   On: 10/22/2016 16:18   Ct Angio Chest Pe W Or Wo Contrast  Result Date:  10/23/2016 CLINICAL DATA:  Paroxysmal atrial fibrillation, elevated D-dimer,, possible PE, history of non small cell lung cancer EXAM: CT ANGIOGRAPHY CHEST WITH CONTRAST TECHNIQUE: Multidetector CT imaging of the chest was performed using the standard protocol during bolus administration of intravenous contrast. Multiplanar CT image reconstructions and MIPs were obtained to evaluate the vascular anatomy. CONTRAST:  57 cc Isovue COMPARISON:  CT scan of the chest 07/02/2016 FINDINGS: Cardiovascular: Heart size within normal limits. No pericardial effusion. Atherosclerotic calcifications of coronary arteries again noted. Atherosclerotic calcifications of thoracic aorta. No evidence of aortic aneurysm. The study is diagnostic.  No evidence of pulmonary embolus. Mediastinum/Nodes: No mediastinal hematoma or adenopathy. No hilar adenopathy. Visualized esophagus is unremarkable. Lungs/Pleura: Again noted bilateral emphysematous changes most significant in upper lobes. There is small left pleural effusion. There is small atelectasis or infiltrate in left lower lobe posterior medially. Mild bronchitic changes are noted in left lower lobe posterior medially. Small atelectasis noted bilateral upper lobe just anterior to major fissure. Stable postradiation fibrotic changes in medial aspect of the right upper lobe. Stable 9 mm nodule basilar in right upper lobe axial image 61 lung windows. There is no pulmonary edema. No new pulmonary nodules are noted. No definite recurrent mass. Upper Abdomen: Visualized upper abdomen shows again micro nodular liver contour probable due to cirrhosis. No adrenal gland mass. Visualized spleen and pancreas is unremarkable. There is a again noted 6 mm calcified gallstone within fundal gallbladder. Musculoskeletal: Sagittal images of the spine shows diffuse osteopenia. Stable mild degenerative changes thoracic spine. Review of the MIP images confirms the above findings.  IMPRESSION: 1. No pulmonary  embolus is identified. 2. There is small left pleural effusion. Small atelectasis or infiltrate in left lower lobe posterior medially. Mild bronchitic changes are noted in left lower lobe axial image 56. 3. Again noted bilateral emphysematous changes. Stable postradiation changes and fibrotic changes in right upper lobe posterior medially. No definite evidence of recurrent tumor. No pulmonary edema. 4. Atherosclerotic calcifications of thoracic aorta and coronary arteries. No evidence of aortic aneurysm. 5. Osteopenia and mild degenerative changes thoracic spine. 6. Stable 9 mm nodule in right upper lobe basilar just adjacent to minor fissure Electronically Signed   By: Lahoma Crocker M.D.   On: 10/23/2016 13:15   Nm Pulmonary Perf And Vent  Result Date: 10/22/2016 CLINICAL DATA:  Shortness of breath, history lung cancer EXAM: NUCLEAR MEDICINE VENTILATION - PERFUSION LUNG SCAN TECHNIQUE: Ventilation images were obtained in multiple projections using inhaled aerosol Tc-55mDTPA. Perfusion images were obtained in multiple projections after intravenous injection of Tc-915mAA. RADIOPHARMACEUTICALS:  30.1 mCi Technetium-9939mPA aerosol inhalation and 4.1 mCi Technetium-31m70m IV COMPARISON:  None Chest radiograph:  10/22/2016 FINDINGS: Ventilation: Central airway deposition of aerosol. Diminished ventilation throughout LEFT lower lobe and lingula. Areas of diminished ventilation in both upper lobes. Perfusion: Subsegmental perfusion defects lateral RIGHT upper lobe, matching. Diffusely diminished perfusion at RIGHT apex corresponding to severe bullous disease on chest radiograph. More focally diminished perfusion throughout LEFT lower lobe and lingula with overall diffusely less perfusion to LEFT lung than RIGHT. Chest radiograph: Increased atelectasis versus infiltrate at LEFT lung base involving probably the lower lobe and lingula. Severe bullous disease at the RIGHT apex with significant emphysematous changes in  LEFT upper lobe as well. IMPRESSION: Abnormal areas of matching diminished ventilation and perfusion in both upper lobes, corresponding to severe COPD changes on chest radiography. Additional matching ventilation, perfusion and radiographic abnormalities in the lower LEFT lung. The presence of significant triple matching abnormalities represents an intermediate probability for pulmonary embolism. Electronically Signed   By: MarkLavonia Dana.   On: 10/22/2016 16:08    Anti-infectives: Anti-infectives    None      Assessment/Plan: Small bowel obstruction likely secondary to adhesions. Improved from admission but still having some occasional cramps and nausea. His last abdominal x-rays were on February 21 and showed persistent small bowel obstruction. I would like to repeat his abdominal x-rays today to look for evidence of resolution. Ambulation encouraged. Observe in the hospital today and possible discharge tomorrow if x-rays are improved and no further worrisome symptoms.    LOS: 4 days    Dalyah Pla T 10/24/2016

## 2016-10-24 NOTE — Progress Notes (Signed)
[facility] PROGRESS NOTE  AKILI CORSETTI GHW:[id number] DOB: 1940-10-23 DOA: 10/20/2016 PCP: [name], MD  Interim summary and HPI 76 y.o. male with medical history of COPD, paroxysmal atrial fibrillation, coronary artery disease, non-small cell lung cancer, and previous small bowel obstruction presents with 3 day history of abdominal pain and distention and one day of nausea, vomiting. Found to have SBO most likely from adhesions.  Assessment/Plan: 1-SBO:most likely due to adhesions -unable to have NGT placed; but with conservative management and NPO status, has had significant clinically improvement -no abd pain, no nausea and no vomiting currently. -per [facility] will continue increasing physical activity, continue soft diet and check abd x-ray -continue electrolytes repletion -follow clinical response; hopefully home in am -continue PO meds -passing gas actively and with BM's overnight  2-dehydration and AKI: due to GI loses and poor PO intake -will advance to soft diet -adjust IVF's rate -Cr  WNL now; 0.70 today -no signs of UTI -will repeat BMET in am, since received contrast with CTA  3-acute resp failure with hypoxia: -due to A. Fib and abdominal distension most likely -improved now that his abd is no distended and HR controlled -completely off oxygen supplementation now and with good O2 sat.  4-paroxysmal A. Fib -CHADsVASC score 4 -will resume PO metoprolol for rate control -follow electrolytes (K and Mg and replete them as needed) -continue telemetry monitoring   5-elevated D-dimer and troponin -chronically with elevated D-dimer; even higher this time, not complaining of CP or SOB currently; but presented with hypoxia.Marland Kitchen -V/Q scan intermediate probability; CT angio chest neg for PE -troponin milddly elevated from A. Fib most likely -no CP and no abnormalities seen on telemetry/EKG suggesting acute ischemia -will monitor -continue B-blocker for rate  controlled  6-hypothyroidism  -will continue synthroid PO  7-HTN -BP is stable -continue metoprolol by mouth  8-Non-small cell lung cancer stage IIIa/IIIB -Presently in remission -Follows Dr. Julien Nordmann  9-Chronic diastolic CHF -Appears dehydrated clinically -Holding furosemide for now -Daily weights and strict I's and O's    Code Status: Full Family Communication: no family at bedside  Disposition Plan: remains inpatient; continue medications by mouth. Replete electrolytes and increase physical activity. Hopefully home in am. abd x-ray ordered by [facility] to guaranteed resolution of SBO. Tolerating soft diet.   Consultants:  [facility]  Procedures:  See below for x-ray reports   Antibiotics:  None   HPI/Subjective: Afebrile, no CP, no abd pain. Reporting some nausea overnight, no vomiting and tolerating soft diet.  Objective: Vitals:   10/23/16 2104 10/24/16 0518  BP: 122/74 129/74  Pulse: 100 87  Resp: 18 18  Temp: 99 F (37.2 C) 99 F (37.2 C)    Intake/Output Summary (Last 24 hours) at 10/24/16 1208 Last data filed at 10/24/16 0700  Gross per 24 hour  Intake            725.5 ml  Output             2650 ml  Net          -1924.5 ml   Filed Weights   10/20/16 2117  Weight: 98 kg (216 lb)    Exam:   General:  Afebrile, denies CP and SOB. Patient with some nausea overnight, no vomiting. Has tolerated soft diet. No major abd pain.  Cardiovascular: irregular, no rubs, no gallops, no JVD; rate controlled today  Respiratory: no crackles, no wheezing, good air movement   Abdomen: soft, no tenderness; positive BS appreciated  Musculoskeletal:  trace edema, no cyanosis    Data Reviewed: Basic Metabolic Panel:  Recent Labs Lab 10/20/16 0852 10/21/16 0310 10/22/16 0359 10/23/16 0353 10/24/16 0524  NA 136 137 139 137 138  K 4.5 4.1 3.5 4.2 3.4*  CL 101 107 112* 109 108  CO2 23 20* '23 25 24  '$ GLUCOSE 129* 92 85 93 93  BUN 27* 39* 35* 18 9  CREATININE  1.33* 1.56* 1.01 0.76 0.70  CALCIUM 10.1 8.6* 7.7* 7.6* 7.8*  MG  --  1.5* 2.0 1.9 1.7  PHOS  --  3.7  --   --   --    Liver Function Tests:  Recent Labs Lab 10/20/16 0852  AST 44*  ALT 30  ALKPHOS 114  BILITOT 3.0*  PROT 7.5  ALBUMIN 3.8   CBC:  Recent Labs Lab 10/20/16 0852 10/21/16 0310 10/23/16 0353  WBC 5.3 5.9 4.4  NEUTROABS 2.9 3.6  --   HGB 17.3* 15.3 12.3*  HCT 48.4 43.4 35.3*  MCV 95.3 96.0 96.2  PLT 120* 91* 158   Cardiac Enzymes:  Recent Labs Lab 10/21/16 0310 10/21/16 0850 10/21/16 1529  TROPONINI <0.03 <0.03 0.03*    CBG: No results for input(s): GLUCAP in the last 168 hours.  Recent Results (from the past 240 hour(s))  C difficile quick scan w PCR reflex     Status: None   Collection Time: 10/21/16  4:44 PM  Result Value Ref Range Status   C Diff antigen NEGATIVE NEGATIVE Final   C Diff toxin NEGATIVE NEGATIVE Final   C Diff interpretation No C. difficile detected.  Final     Studies: Dg Chest 2 View  Result Date: 10/22/2016 CLINICAL DATA:  Shortness of breath. EXAM: CHEST  2 VIEW COMPARISON:  10/20/2016.  CT 07/02/2016. FINDINGS: Port-A-Cath in stable position. Heart size stable. Chronic bilateral interstitial changes consistent chronic interstitial lung disease again noted. Stable bullous changes and pleural-parenchymal thickening consistent with scarring noted. No pneumothorax. Stable elevation left hemidiaphragm. IMPRESSION: 1. Port-A-Cath in stable position. 2. Chronic interstitial lung disease, bullous COPD, pleural-parenchymal scarring. Lung changes are stable from prior exam. No acute cardiopulmonary disease. Electronically Signed   By: Marcello Moores  Register   On: 10/22/2016 16:18   Ct Angio Chest Pe W Or Wo Contrast  Result Date: 10/23/2016 CLINICAL DATA:  Paroxysmal atrial fibrillation, elevated D-dimer,, possible PE, history of non small cell lung cancer EXAM: CT ANGIOGRAPHY CHEST WITH CONTRAST TECHNIQUE: Multidetector CT imaging of the  chest was performed using the standard protocol during bolus administration of intravenous contrast. Multiplanar CT image reconstructions and MIPs were obtained to evaluate the vascular anatomy. CONTRAST:  57 cc Isovue COMPARISON:  CT scan of the chest 07/02/2016 FINDINGS: Cardiovascular: Heart size within normal limits. No pericardial effusion. Atherosclerotic calcifications of coronary arteries again noted. Atherosclerotic calcifications of thoracic aorta. No evidence of aortic aneurysm. The study is diagnostic.  No evidence of pulmonary embolus. Mediastinum/Nodes: No mediastinal hematoma or adenopathy. No hilar adenopathy. Visualized esophagus is unremarkable. Lungs/Pleura: Again noted bilateral emphysematous changes most significant in upper lobes. There is small left pleural effusion. There is small atelectasis or infiltrate in left lower lobe posterior medially. Mild bronchitic changes are noted in left lower lobe posterior medially. Small atelectasis noted bilateral upper lobe just anterior to major fissure. Stable postradiation fibrotic changes in medial aspect of the right upper lobe. Stable 9 mm nodule basilar in right upper lobe axial image 61 lung windows. There is no pulmonary edema. No new pulmonary nodules  are noted. No definite recurrent mass. Upper Abdomen: Visualized upper abdomen shows again micro nodular liver contour probable due to cirrhosis. No adrenal gland mass. Visualized spleen and pancreas is unremarkable. There is a again noted 6 mm calcified gallstone within fundal gallbladder. Musculoskeletal: Sagittal images of the spine shows diffuse osteopenia. Stable mild degenerative changes thoracic spine. Review of the MIP images confirms the above findings. IMPRESSION: 1. No pulmonary embolus is identified. 2. There is small left pleural effusion. Small atelectasis or infiltrate in left lower lobe posterior medially. Mild bronchitic changes are noted in left lower lobe axial image 56. 3. Again  noted bilateral emphysematous changes. Stable postradiation changes and fibrotic changes in right upper lobe posterior medially. No definite evidence of recurrent tumor. No pulmonary edema. 4. Atherosclerotic calcifications of thoracic aorta and coronary arteries. No evidence of aortic aneurysm. 5. Osteopenia and mild degenerative changes thoracic spine. 6. Stable 9 mm nodule in right upper lobe basilar just adjacent to minor fissure Electronically Signed   By: Lahoma Crocker M.D.   On: 10/23/2016 13:15   Nm Pulmonary Perf And Vent  Result Date: 10/22/2016 CLINICAL DATA:  Shortness of breath, history lung cancer EXAM: NUCLEAR MEDICINE VENTILATION - PERFUSION LUNG SCAN TECHNIQUE: Ventilation images were obtained in multiple projections using inhaled aerosol Tc-68mDTPA. Perfusion images were obtained in multiple projections after intravenous injection of Tc-925mAA. RADIOPHARMACEUTICALS:  30.1 mCi Technetium-9984mPA aerosol inhalation and 4.1 mCi Technetium-46m46m IV COMPARISON:  None Chest radiograph:  10/22/2016 FINDINGS: Ventilation: Central airway deposition of aerosol. Diminished ventilation throughout LEFT lower lobe and lingula. Areas of diminished ventilation in both upper lobes. Perfusion: Subsegmental perfusion defects lateral RIGHT upper lobe, matching. Diffusely diminished perfusion at RIGHT apex corresponding to severe bullous disease on chest radiograph. More focally diminished perfusion throughout LEFT lower lobe and lingula with overall diffusely less perfusion to LEFT lung than RIGHT. Chest radiograph: Increased atelectasis versus infiltrate at LEFT lung base involving probably the lower lobe and lingula. Severe bullous disease at the RIGHT apex with significant emphysematous changes in LEFT upper lobe as well. IMPRESSION: Abnormal areas of matching diminished ventilation and perfusion in both upper lobes, corresponding to severe COPD changes on chest radiography. Additional matching ventilation,  perfusion and radiographic abnormalities in the lower LEFT lung. The presence of significant triple matching abnormalities represents an intermediate probability for pulmonary embolism. Electronically Signed   By: MarkLavonia Dana.   On: 10/22/2016 16:08   Dg Abd 2 Views  Result Date: 10/24/2016 CLINICAL DATA:  Followup small bowel obstruction. EXAM: ABDOMEN - 2 VIEW COMPARISON:  10/21/2016 radiographs and 10/20/2016 CT FINDINGS: Small bowel distention has decreased with only a few mildly distended gas and fluid-filled small bowel loops remaining. There is no evidence of pneumoperitoneum. No other significant changes are identified. IMPRESSION: Decreased small bowel distension compatible with improving small bowel obstruction. Electronically Signed   By: JeffMargarette Canada.   On: 10/24/2016 09:45    Scheduled Meds: . enoxaparin (LOVENOX) injection  40 mg Subcutaneous Q24H  . levalbuterol  0.63 mg Nebulization BID  . levothyroxine  175 mcg Oral QAC breakfast  . metoprolol tartrate  25 mg Oral BID  . multivitamin with minerals  1 tablet Oral Daily  . sodium chloride  500 mL Intravenous Once  . sodium chloride flush  10-40 mL Intracatheter Q12H  . sodium chloride flush  3 mL Intravenous Q12H  . tiotropium  18 mcg Inhalation Daily   Continuous Infusions: . sodium chloride 20 mL/hr  at 10/23/16 1406    Active Problems:   COPD (chronic obstructive pulmonary disease) (HCC)   Atrial fibrillation (HCC)   Coronary artery disease due to lipid rich plaque   Essential hypertension   Small bowel obstruction   AKI (acute kidney injury) ([place])   Acute respiratory failure with hypoxia ([place])    Time spent: [place] [place] [facility] Pager [phone]. If 7PM-7AM, please contact night-coverage at [email or web address], password [facility] 10/24/2016, 12:08 PM  LOS: 4 days

## 2016-10-25 DIAGNOSIS — R7989 Other specified abnormal findings of blood chemistry: Secondary | ICD-10-CM

## 2016-10-25 DIAGNOSIS — R0902 Hypoxemia: Secondary | ICD-10-CM

## 2016-10-25 DIAGNOSIS — R0602 Shortness of breath: Secondary | ICD-10-CM

## 2016-10-25 LAB — BASIC METABOLIC PANEL
ANION GAP: 6 (ref 5–15)
BUN: 8 mg/dL (ref 6–20)
CO2: 23 mmol/L (ref 22–32)
Calcium: 7.7 mg/dL — ABNORMAL LOW (ref 8.9–10.3)
Chloride: 108 mmol/L (ref 101–111)
Creatinine, Ser: 0.7 mg/dL (ref 0.61–1.24)
GFR calc non Af Amer: >60 mL/min (ref >60)
Glucose, Bld: 94 mg/dL (ref 65–99)
POTASSIUM: 3.5 mmol/L (ref 3.5–5.1)
SODIUM: 137 mmol/L (ref 135–145)

## 2016-10-25 MED ORDER — HEPARIN SOD (PORK) LOCK FLUSH 100 UNIT/ML IV SOLN
500.0000 [IU] | INTRAVENOUS | Status: AC | PRN
  Administered 2016-10-25: 500 [IU]

## 2016-10-25 MED ORDER — POLYETHYLENE GLYCOL 3350 17 G PO PACK: 17.0000 g | PACK | Freq: Every day | ORAL | 0 refills | Status: DC | PRN

## 2016-10-25 MED ORDER — HYDROCODONE-ACETAMINOPHEN 5-325 MG PO TABS: 1.0000 | ORAL_TABLET | Freq: Three times a day (TID) | ORAL | 0 refills | Status: DC | PRN

## 2016-10-25 MED ORDER — ACETAMINOPHEN 325 MG PO TABS: 650.0000 mg | ORAL_TABLET | Freq: Four times a day (QID) | ORAL | 0 refills | Status: DC | PRN

## 2016-10-25 NOTE — Progress Notes (Signed)
Patient ID: Joseph [name], male   DOB: 10-May-1941, 76 y.o.   MRN: [id number]       Subjective: Has had several bowel movements. No abdominal pain. Tolerating diet although his appetite is not great.  Objective: Vital signs in last 24 hours: Temp:  [98.3 F (36.8 C)-99.7 F (37.6 C)] 99.1 F (37.3 C) (02/25 0515) Pulse Rate:  [92-98] 92 (02/25 0515) Resp:  [20] 20 (02/25 0515) BP: (112-128)/(76-80) 112/79 (02/25 0515) SpO2:  [93 %-95 %] 93 % (02/25 0515) Last BM Date: 10/25/16  Intake/Output from previous day: 02/24 0701 - 02/25 0700 In: 460 [I.V.:460] Out: 350 [Urine:350] Intake/Output this shift: Total I/O In: 120 [P.O.:120] Out: -   General appearance: alert, cooperative and no distress GI: Minimal if any distention improved from yesterday. Soft and nontender.  Lab Results:   Recent Labs  10/23/16 0353  WBC 4.4  HGB 12.3*  HCT 35.3*  PLT 158   BMET  Recent Labs  10/24/16 0524 10/25/16 0346  NA 138 137  K 3.4* 3.5  CL 108 108  CO2 24 23  GLUCOSE 93 94  BUN 9 8  CREATININE 0.70 0.70  CALCIUM 7.8* 7.7*     Studies/Results: Ct Angio Chest Pe W Or Wo Contrast  Result Date: 10/23/2016 CLINICAL DATA:  Paroxysmal atrial fibrillation, elevated D-dimer,, possible PE, history of non small cell lung cancer EXAM: CT ANGIOGRAPHY CHEST WITH CONTRAST TECHNIQUE: Multidetector CT imaging of the chest was performed using the standard protocol during bolus administration of intravenous contrast. Multiplanar CT image reconstructions and MIPs were obtained to evaluate the vascular anatomy. CONTRAST:  57 cc Isovue COMPARISON:  CT scan of the chest 07/02/2016 FINDINGS: Cardiovascular: Heart size within normal limits. No pericardial effusion. Atherosclerotic calcifications of coronary arteries again noted. Atherosclerotic calcifications of thoracic aorta. No evidence of aortic aneurysm. The study is diagnostic.  No evidence of pulmonary embolus. Mediastinum/Nodes: No  mediastinal hematoma or adenopathy. No hilar adenopathy. Visualized esophagus is unremarkable. Lungs/Pleura: Again noted bilateral emphysematous changes most significant in upper lobes. There is small left pleural effusion. There is small atelectasis or infiltrate in left lower lobe posterior medially. Mild bronchitic changes are noted in left lower lobe posterior medially. Small atelectasis noted bilateral upper lobe just anterior to major fissure. Stable postradiation fibrotic changes in medial aspect of the right upper lobe. Stable 9 mm nodule basilar in right upper lobe axial image 61 lung windows. There is no pulmonary edema. No new pulmonary nodules are noted. No definite recurrent mass. Upper Abdomen: Visualized upper abdomen shows again micro nodular liver contour probable due to cirrhosis. No adrenal gland mass. Visualized spleen and pancreas is unremarkable. There is a again noted 6 mm calcified gallstone within fundal gallbladder. Musculoskeletal: Sagittal images of the spine shows diffuse osteopenia. Stable mild degenerative changes thoracic spine. Review of the MIP images confirms the above findings. IMPRESSION: 1. No pulmonary embolus is identified. 2. There is small left pleural effusion. Small atelectasis or infiltrate in left lower lobe posterior medially. Mild bronchitic changes are noted in left lower lobe axial image 56. 3. Again noted bilateral emphysematous changes. Stable postradiation changes and fibrotic changes in right upper lobe posterior medially. No definite evidence of recurrent tumor. No pulmonary edema. 4. Atherosclerotic calcifications of thoracic aorta and coronary arteries. No evidence of aortic aneurysm. 5. Osteopenia and mild degenerative changes thoracic spine. 6. Stable 9 mm nodule in right upper lobe basilar just adjacent to minor fissure Electronically Signed   By: Julien Girt  Pop M.D.   On: 10/23/2016 13:15   Dg Abd 2 Views  Result Date: 10/24/2016 CLINICAL DATA:  Followup  small bowel obstruction. EXAM: ABDOMEN - 2 VIEW COMPARISON:  10/21/2016 radiographs and 10/20/2016 CT FINDINGS: Small bowel distention has decreased with only a few mildly distended gas and fluid-filled small bowel loops remaining. There is no evidence of pneumoperitoneum. No other significant changes are identified. IMPRESSION: Decreased small bowel distension compatible with improving small bowel obstruction. Electronically Signed   By: Margarette Canada M.D.   On: 10/24/2016 09:45    Anti-infectives: Anti-infectives    None      Assessment/Plan: Resolving small bowel obstruction. I think he is fine for discharge. I advised soft diet only for the next 2 weeks. He will call as needed for any recurrent symptoms.    LOS: 5 days    Kinsley Nicklaus T 10/25/2016

## 2016-10-25 NOTE — Discharge Summary (Signed)
Physician Discharge Summary  Joseph [name] Conard QQP:[id number] DOB: August 08, 1941 DOA: 10/20/2016  PCP: [name], MD  Admit date: 10/20/2016 Discharge date: 10/25/2016  Time spent: 35 minutes  Recommendations for Outpatient Follow-up:  Repeat BMET to follow electrolytes and renal function  Reassess volume status Repeat BP and adjust antihypertensive regimen as needed   Discharge Diagnoses:  Active Problems:   COPD (chronic obstructive pulmonary disease) (HCC)   Atrial fibrillation (HCC)   Coronary artery disease due to lipid rich plaque   Essential hypertension   Small bowel obstruction   AKI (acute kidney injury) ([facility])   Acute respiratory failure with hypoxia (HCC)   Elevated d-dimer   Hypoxia   SOB (shortness of breath)   Discharge Condition: stable and improved. Discharge home with instructions to follow up with PCP in 10 days and to follow up with oncology service as previously advise.  Diet recommendation: soft heart healthy diet   Filed Weights   10/20/16 2117  Weight: 98 kg (216 lb)    History of present illness:  76 y.o.malewith medical history of COPD, paroxysmal atrial fibrillation, coronary artery disease, non-small cell lung cancer, and previous small bowel obstruction presents with 3 day history of abdominal pain and distention and one day of nausea, vomiting. Found to have SBO most likely from adhesions.  Hospital Course:  1-SBO:most likely due to adhesions -unable to have NGT placed; but with conservative management and NPO status, has had significant clinically improvement. -Repeat Abd x-ray on 2/24 (essentially demonstrating resolving SBO) -no abd pain, no nausea and no vomiting currently. -per CCS ok to discharge home; to continue soft diet for the next 7-10 days and to maintain adequate hydration  -continue electrolytes repletion -continue PO meds -passing gas actively and with BM's overnight  2-dehydration and AKI: due to GI loses and poor PO  intake -encourage to maintain good hydration (especially now that lasix will be resumed) -Cr  WNL now; 0.70 today -no signs of UTI -will recommend repeat BMET at follow up visit to assess renal function and electrolytes  3-acute resp failure with hypoxia: -due to A. Fib and abdominal distension most likely -improved now that his abd is no distended and HR controlled -completely off oxygen supplementation now and with good O2 sat on RA  4-paroxysmal A. Fib -CHADsVASC score 4 -will continue PO metoprolol for rate control and resume aspirin  -electrolytes stable and WNL at discharge  5-elevated D-dimer and troponin -chronically with elevated D-dimer; even higher this time, not complaining of CP or SOB currently; but presented with hypoxia.Marland Kitchen -V/Q scan intermediate probability; CT angio chest neg for PE -troponin milddly elevated from A. Fib most likely -no CP and no abnormalities seen on telemetry/EKG suggesting acute ischemia -will monitor -continue B-blocker for rate controlled -will resume ASA.  6-hypothyroidism  -will continue synthroid   7-HTN -BP is stable -continue metoprolol and at discharge will resume lasix  8-Non-small cell lung cancer stage IIIa/IIIB -Presently in remission -stable 9 mm nodule in right upper lobe basilar on CT angio during this admission -continue Follow up with Dr. Julien Nordmann  9-Chronic diastolic CHF -compensated -Ok to resume lasix at discharge -encourage to watch sodium in his diet and to maintain adequate hydration  -patient instructed to check weight on daily basis to assess volume status    Procedures:  See below for x-ray reports   Consultations:  General surgery   Discharge Exam: Vitals:   10/24/16 2126 10/25/16 0515  BP: 128/76 112/79  Pulse: 98 92  Resp: 20 20  Temp: 99.7 F (37.6 C) 99.1 F (37.3 C)    General:  Afebrile, denies CP and SOB. Patient without nausea or vomiting. Has tolerated soft diet and oral meds.  No major abd pain.  Cardiovascular: irregular, no rubs, no gallops, no JVD; rate controlled today  Respiratory: no crackles, no wheezing, good air movement   Abdomen: soft, no tenderness; positive BS appreciated  Musculoskeletal: trace edema, no cyanosis     Discharge Instructions   Discharge Instructions    Diet - low sodium heart healthy    Complete by:  As directed    Discharge instructions    Complete by:  As directed    Continue advancing diet slowly Increase fiber intake and maintain adequate hydration  Take medications as prescribed Arranged follow up visit with PCP in 10 days Follow up with oncology service as previously instructed (contact office for appointment details if needed)     Current Discharge Medication List    START taking these medications   Details  acetaminophen (TYLENOL) 325 MG tablet Take 2 tablets (650 mg total) by mouth every 6 (six) hours as needed for mild pain (or Fever >/= 101). Qty: 40 tablet, Refills: 0    polyethylene glycol (MIRALAX / GLYCOLAX) packet Take 17 g by mouth daily as needed for mild constipation. Qty: 28 each, Refills: 0      CONTINUE these medications which have CHANGED   Details  HYDROcodone-acetaminophen (NORCO/VICODIN) 5-325 MG tablet Take 1 tablet by mouth every 8 (eight) hours as needed for severe pain. Qty: 15 tablet, Refills: 0      CONTINUE these medications which have NOT CHANGED   Details  acidophilus (RISAQUAD) CAPS capsule Take 1 capsule by mouth daily.    albuterol (PROVENTIL HFA;VENTOLIN HFA) 108 (90 Base) MCG/ACT inhaler Inhale 1-2 puffs into the lungs every 6 (six) hours as needed for wheezing or shortness of breath.    aspirin EC 81 MG tablet Take 1 tablet (81 mg total) by mouth daily. Qty: 90 tablet, Refills: 3    furosemide (LASIX) 20 MG tablet Take 1 tablet (20 mg total) by mouth daily. Qty: 90 tablet, Refills: 1    L-ARGININE PO Take 1 tablet by mouth 2 (two) times daily.     metoprolol  tartrate (LOPRESSOR) 25 MG tablet Take 25 mg by mouth 2 (two) times daily.    Multiple Vitamin (MULTIVITAMIN WITH MINERALS) TABS tablet Take 1 tablet by mouth daily.    nystatin-triamcinolone (MYCOLOG II) cream Apply 1 application topically 2 (two) times daily as needed (for itching).     OVER THE COUNTER MEDICATION Take 2 tablets by mouth 2 (two) times daily. Medication:  Lung Support    Specialty Vitamins Products (PROSTATE PO) Take 1 tablet by mouth 2 (two) times daily.    SYNTHROID 175 MCG tablet Take 175 mcg by mouth daily before breakfast.     tiotropium (SPIRIVA) 18 MCG inhalation capsule Place 18 mcg into inhaler and inhale daily.     vitamin B-12 (CYANOCOBALAMIN) 1000 MCG tablet Take 1,000 mcg by mouth daily.       No Known Allergies Follow-up Information    [name], MD. Schedule an appointment as soon as possible for a visit in 10 day(s).   Specialty:  Internal Medicine Contact information: [place] [address] [phone]           The results of significant diagnostics from this hospitalization (including imaging, microbiology, ancillary and laboratory) are listed  below for reference.    Significant Diagnostic Studies: Dg Chest 2 View  Result Date: 10/22/2016 CLINICAL DATA:  Shortness of breath. EXAM: CHEST  2 VIEW COMPARISON:  10/20/2016.  CT 07/02/2016. FINDINGS: Port-A-Cath in stable position. Heart size stable. Chronic bilateral interstitial changes consistent chronic interstitial lung disease again noted. Stable bullous changes and pleural-parenchymal thickening consistent with scarring noted. No pneumothorax. Stable elevation left hemidiaphragm. IMPRESSION: 1. Port-A-Cath in stable position. 2. Chronic interstitial lung disease, bullous COPD, pleural-parenchymal scarring. Lung changes are stable from prior exam. No acute cardiopulmonary disease. Electronically Signed   By: Marcello Moores  Register   On: 10/22/2016 16:18   Dg Chest 2  View  Result Date: 10/20/2016 CLINICAL DATA:  Mid and upper abdominal pain associated with nausea and vomiting and abdominal distension. Symptoms increasing over the past 4 days. Also shortness of breath and hypoxia. History of right lung malignancy, COPD, former smoker. EXAM: CHEST  2 VIEW COMPARISON:  CT scan of the chest of July 02, 2016 and portable chest x-ray of April 04, 2015. FINDINGS: The lungs are adequately inflated. The interstitial markings remain increased in the right mid and lower lung and in the left mid lung. There is no alveolar infiltrate or pleural effusion. The heart and pulmonary vascularity are normal. There is calcification in the wall of the aortic arch. The porta catheter tip projects over the proximal SVC. There is multilevel degenerative disc disease of the thoracic spine. The gas pattern in the upper abdomen is unremarkable. IMPRESSION: Chronic interstitial changes bilaterally. Those on the right are likely due to previous radiation therapy. On the left may may reflect scarring. Bullous lesions in the apices. No pneumonia nor CHF. Thoracic aortic atherosclerosis. Given the patient's abdominal symptoms, supine and upright abdominal radiographs may be useful. Electronically Signed   By: David  Martinique M.D.   On: 10/20/2016 09:50   Ct Angio Chest Pe W Or Wo Contrast  Result Date: 10/23/2016 CLINICAL DATA:  Paroxysmal atrial fibrillation, elevated D-dimer,, possible PE, history of non small cell lung cancer EXAM: CT ANGIOGRAPHY CHEST WITH CONTRAST TECHNIQUE: Multidetector CT imaging of the chest was performed using the standard protocol during bolus administration of intravenous contrast. Multiplanar CT image reconstructions and MIPs were obtained to evaluate the vascular anatomy. CONTRAST:  57 cc Isovue COMPARISON:  CT scan of the chest 07/02/2016 FINDINGS: Cardiovascular: Heart size within normal limits. No pericardial effusion. Atherosclerotic calcifications of coronary arteries  again noted. Atherosclerotic calcifications of thoracic aorta. No evidence of aortic aneurysm. The study is diagnostic.  No evidence of pulmonary embolus. Mediastinum/Nodes: No mediastinal hematoma or adenopathy. No hilar adenopathy. Visualized esophagus is unremarkable. Lungs/Pleura: Again noted bilateral emphysematous changes most significant in upper lobes. There is small left pleural effusion. There is small atelectasis or infiltrate in left lower lobe posterior medially. Mild bronchitic changes are noted in left lower lobe posterior medially. Small atelectasis noted bilateral upper lobe just anterior to major fissure. Stable postradiation fibrotic changes in medial aspect of the right upper lobe. Stable 9 mm nodule basilar in right upper lobe axial image 61 lung windows. There is no pulmonary edema. No new pulmonary nodules are noted. No definite recurrent mass. Upper Abdomen: Visualized upper abdomen shows again micro nodular liver contour probable due to cirrhosis. No adrenal gland mass. Visualized spleen and pancreas is unremarkable. There is a again noted 6 mm calcified gallstone within fundal gallbladder. Musculoskeletal: Sagittal images of the spine shows diffuse osteopenia. Stable mild degenerative changes thoracic spine. Review of the MIP  images confirms the above findings. IMPRESSION: 1. No pulmonary embolus is identified. 2. There is small left pleural effusion. Small atelectasis or infiltrate in left lower lobe posterior medially. Mild bronchitic changes are noted in left lower lobe axial image 56. 3. Again noted bilateral emphysematous changes. Stable postradiation changes and fibrotic changes in right upper lobe posterior medially. No definite evidence of recurrent tumor. No pulmonary edema. 4. Atherosclerotic calcifications of thoracic aorta and coronary arteries. No evidence of aortic aneurysm. 5. Osteopenia and mild degenerative changes thoracic spine. 6. Stable 9 mm nodule in right upper lobe  basilar just adjacent to minor fissure Electronically Signed   By: Lahoma Crocker M.D.   On: 10/23/2016 13:15   Ct Abdomen Pelvis W Contrast  Result Date: 10/20/2016 CLINICAL DATA:  Abdominal pain and distention. History of obstruction. History of lung cancer, hernia repair, and abdominal aortic aneurysm repair. EXAM: CT ABDOMEN AND PELVIS WITH CONTRAST TECHNIQUE: Multidetector CT imaging of the abdomen and pelvis was performed using the standard protocol following bolus administration of intravenous contrast. CONTRAST:  100 mL Isovue-300 COMPARISON:  01/13/2016 FINDINGS: Lower chest: Centrilobular emphysema and subsegmental atelectasis in the lung bases. No pleural effusion. Three-vessel coronary artery atherosclerosis. Normal heart size. Hepatobiliary: Morphologic changes suggestive of cirrhosis are again noted including a nodular liver contour and enlargement of the lateral segment of the left hepatic lobe. No focal liver abnormality is identified. A 6 mm stone is again noted in the gallbladder which is moderately distended but without evidence of wall thickening or pericholecystic inflammation. No biliary dilatation. Pancreas: Unremarkable. Spleen: No focal abnormality. Decreased splenic volume since the prior study. Adrenals/Urinary Tract: Unremarkable adrenal glands. No evidence of renal mass, calculi, or hydronephrosis. Unremarkable bladder. Stomach/Bowel: Moderate distention of the stomach by fluid and gas. There is moderate diffuse proximal small bowel dilatation with loops measuring up to 5 cm in diameter and containing multiple air-fluid levels. There is a transition point in the central abdomen at the site of a small bowel anastomosis (series 2, image 66) with more distal small bowel being decompressed. There is diffuse colonic diverticulosis without evidence of diverticulitis. The appendix is unremarkable. Vascular/Lymphatic: Moderately advanced atherosclerosis involving the abdominal aorta and its  major branch vessels. Prior infrarenal abdominal aortic aneurysm repair. The aorta measures up to 3.3 cm in AP diameter, unchanged. 10 mm portacaval and 9 mm periportal/peripancreatic lymph nodes are unchanged and likely reactive due to chronic liver disease. Reproductive: Mildly enlarged prostate. Other: Unchanged small fat containing right inguinal hernia. Trace perihepatic fluid. Musculoskeletal: Bilateral L5 pars defects with grade 1 anterolisthesis, unchanged. Multilevel lumbar disc degeneration, greatest at L2-3. IMPRESSION: 1. Small bowel obstruction with transition point in the central abdomen at a small bowel anastomosis. 2. Aortic atherosclerosis.  Prior abdominal aortic aneurysm repair. 3. Morphologic changes of cirrhosis.  Trace perihepatic fluid. 4. Cholelithiasis. Electronically Signed   By: Logan Bores M.D.   On: 10/20/2016 10:37   Nm Pulmonary Perf And Vent  Result Date: 10/22/2016 CLINICAL DATA:  Shortness of breath, history lung cancer EXAM: NUCLEAR MEDICINE VENTILATION - PERFUSION LUNG SCAN TECHNIQUE: Ventilation images were obtained in multiple projections using inhaled aerosol Tc-49mDTPA. Perfusion images were obtained in multiple projections after intravenous injection of Tc-937mAA. RADIOPHARMACEUTICALS:  30.1 mCi Technetium-9961mPA aerosol inhalation and 4.1 mCi Technetium-48m45m IV COMPARISON:  None Chest radiograph:  10/22/2016 FINDINGS: Ventilation: Central airway deposition of aerosol. Diminished ventilation throughout LEFT lower lobe and lingula. Areas of diminished ventilation in both upper lobes. Perfusion:  Subsegmental perfusion defects lateral RIGHT upper lobe, matching. Diffusely diminished perfusion at RIGHT apex corresponding to severe bullous disease on chest radiograph. More focally diminished perfusion throughout LEFT lower lobe and lingula with overall diffusely less perfusion to LEFT lung than RIGHT. Chest radiograph: Increased atelectasis versus infiltrate at LEFT  lung base involving probably the lower lobe and lingula. Severe bullous disease at the RIGHT apex with significant emphysematous changes in LEFT upper lobe as well. IMPRESSION: Abnormal areas of matching diminished ventilation and perfusion in both upper lobes, corresponding to severe COPD changes on chest radiography. Additional matching ventilation, perfusion and radiographic abnormalities in the lower LEFT lung. The presence of significant triple matching abnormalities represents an intermediate probability for pulmonary embolism. Electronically Signed   By: Lavonia Dana M.D.   On: 10/22/2016 16:08   Dg Abd 2 Views  Result Date: 10/24/2016 CLINICAL DATA:  Followup small bowel obstruction. EXAM: ABDOMEN - 2 VIEW COMPARISON:  10/21/2016 radiographs and 10/20/2016 CT FINDINGS: Small bowel distention has decreased with only a few mildly distended gas and fluid-filled small bowel loops remaining. There is no evidence of pneumoperitoneum. No other significant changes are identified. IMPRESSION: Decreased small bowel distension compatible with improving small bowel obstruction. Electronically Signed   By: Margarette Canada M.D.   On: 10/24/2016 09:45   Dg Abd 2 Views  Result Date: 10/21/2016 CLINICAL DATA:  76 y/o  M; bowel obstruction and diarrhea. EXAM: ABDOMEN - 2 VIEW COMPARISON:  10/20/2016 CT abdomen and pelvis FINDINGS: Dilated loops of small bowel with fluid levels compatible with bowel obstruction. Retained contrast in the bladder from prior CT. Degenerative changes of the lower lumbar spine. No pneumoperitoneum. IMPRESSION: Small bowel obstruction.  No pneumoperitoneum. Electronically Signed   By: Kristine Garbe M.D.   On: 10/21/2016 15:00   Microbiology: Recent Results (from the past 240 hour(s))  C difficile quick scan w PCR reflex     Status: None   Collection Time: 10/21/16  4:44 PM  Result Value Ref Range Status   C Diff antigen NEGATIVE NEGATIVE Final   C Diff toxin NEGATIVE NEGATIVE  Final   C Diff interpretation No C. difficile detected.  Final    Labs: Basic Metabolic Panel:  Recent Labs Lab 10/21/16 0310 10/22/16 0359 10/23/16 0353 10/24/16 0524 10/25/16 0346  NA 137 139 137 138 137  K 4.1 3.5 4.2 3.4* 3.5  CL 107 112* 109 108 108  CO2 20* '23 25 24 23  '$ GLUCOSE 92 85 93 93 94  BUN 39* 35* '18 9 8  '$ CREATININE 1.56* 1.01 0.76 0.70 0.70  CALCIUM 8.6* 7.7* 7.6* 7.8* 7.7*  MG 1.5* 2.0 1.9 1.7  --   PHOS 3.7  --   --   --   --    Liver Function Tests:  Recent Labs Lab 10/20/16 0852  AST 44*  ALT 30  ALKPHOS 114  BILITOT 3.0*  PROT 7.5  ALBUMIN 3.8   CBC:  Recent Labs Lab 10/20/16 0852 10/21/16 0310 10/23/16 0353  WBC 5.3 5.9 4.4  NEUTROABS 2.9 3.6  --   HGB 17.3* 15.3 12.3*  HCT 48.4 43.4 35.3*  MCV 95.3 96.0 96.2  PLT 120* 91* 158   Cardiac Enzymes:  Recent Labs Lab 10/21/16 0310 10/21/16 0850 10/21/16 1529  TROPONINI <0.03 <0.03 0.03*    Signed:  Barton Dubois MD.  [facility] 10/25/2016, 12:26 PM

## 2016-10-29 DIAGNOSIS — I10 Essential (primary) hypertension: Secondary | ICD-10-CM | POA: Diagnosis not present

## 2016-11-17 DIAGNOSIS — E784 Other hyperlipidemia: Secondary | ICD-10-CM | POA: Diagnosis not present

## 2016-11-17 DIAGNOSIS — E038 Other specified hypothyroidism: Secondary | ICD-10-CM | POA: Diagnosis not present

## 2016-11-17 DIAGNOSIS — I10 Essential (primary) hypertension: Secondary | ICD-10-CM | POA: Diagnosis not present

## 2016-11-17 DIAGNOSIS — Z125 Encounter for screening for malignant neoplasm of prostate: Secondary | ICD-10-CM | POA: Diagnosis not present

## 2016-11-20 DIAGNOSIS — E038 Other specified hypothyroidism: Secondary | ICD-10-CM | POA: Diagnosis not present

## 2016-11-20 DIAGNOSIS — N179 Acute kidney failure, unspecified: Secondary | ICD-10-CM | POA: Diagnosis not present

## 2016-11-20 DIAGNOSIS — I5032 Chronic diastolic (congestive) heart failure: Secondary | ICD-10-CM | POA: Diagnosis not present

## 2016-11-20 DIAGNOSIS — Z8679 Personal history of other diseases of the circulatory system: Secondary | ICD-10-CM | POA: Diagnosis not present

## 2016-11-20 DIAGNOSIS — R10817 Generalized abdominal tenderness: Secondary | ICD-10-CM | POA: Diagnosis not present

## 2016-11-20 DIAGNOSIS — J9601 Acute respiratory failure with hypoxia: Secondary | ICD-10-CM | POA: Diagnosis not present

## 2016-11-20 DIAGNOSIS — Z6829 Body mass index (BMI) 29.0-29.9, adult: Secondary | ICD-10-CM | POA: Diagnosis not present

## 2016-11-20 DIAGNOSIS — Z85118 Personal history of other malignant neoplasm of bronchus and lung: Secondary | ICD-10-CM | POA: Diagnosis not present

## 2016-11-20 DIAGNOSIS — K56609 Unspecified intestinal obstruction, unspecified as to partial versus complete obstruction: Secondary | ICD-10-CM | POA: Diagnosis not present

## 2016-11-20 DIAGNOSIS — R7989 Other specified abnormal findings of blood chemistry: Secondary | ICD-10-CM | POA: Diagnosis not present

## 2016-11-20 DIAGNOSIS — I10 Essential (primary) hypertension: Secondary | ICD-10-CM | POA: Diagnosis not present

## 2016-11-24 DIAGNOSIS — J449 Chronic obstructive pulmonary disease, unspecified: Secondary | ICD-10-CM | POA: Diagnosis not present

## 2016-11-24 DIAGNOSIS — Z1389 Encounter for screening for other disorder: Secondary | ICD-10-CM | POA: Diagnosis not present

## 2016-11-24 DIAGNOSIS — K7460 Unspecified cirrhosis of liver: Secondary | ICD-10-CM | POA: Diagnosis not present

## 2016-11-24 DIAGNOSIS — I10 Essential (primary) hypertension: Secondary | ICD-10-CM | POA: Diagnosis not present

## 2016-11-24 DIAGNOSIS — Z6829 Body mass index (BMI) 29.0-29.9, adult: Secondary | ICD-10-CM | POA: Diagnosis not present

## 2016-11-24 DIAGNOSIS — Z85118 Personal history of other malignant neoplasm of bronchus and lung: Secondary | ICD-10-CM | POA: Diagnosis not present

## 2016-11-24 DIAGNOSIS — E038 Other specified hypothyroidism: Secondary | ICD-10-CM | POA: Diagnosis not present

## 2016-11-24 DIAGNOSIS — I5032 Chronic diastolic (congestive) heart failure: Secondary | ICD-10-CM | POA: Diagnosis not present

## 2016-11-24 DIAGNOSIS — D696 Thrombocytopenia, unspecified: Secondary | ICD-10-CM | POA: Diagnosis not present

## 2016-11-24 DIAGNOSIS — Z0000 Encounter for general adult medical examination without abnormal findings: Secondary | ICD-10-CM | POA: Diagnosis not present

## 2016-11-24 DIAGNOSIS — E784 Other hyperlipidemia: Secondary | ICD-10-CM | POA: Diagnosis not present

## 2016-11-24 DIAGNOSIS — I480 Paroxysmal atrial fibrillation: Secondary | ICD-10-CM | POA: Diagnosis not present

## 2016-11-25 DIAGNOSIS — Z1212 Encounter for screening for malignant neoplasm of rectum: Secondary | ICD-10-CM | POA: Diagnosis not present

## 2016-12-01 DIAGNOSIS — L821 Other seborrheic keratosis: Secondary | ICD-10-CM | POA: Diagnosis not present

## 2016-12-01 DIAGNOSIS — D2261 Melanocytic nevi of right upper limb, including shoulder: Secondary | ICD-10-CM | POA: Diagnosis not present

## 2016-12-01 DIAGNOSIS — D2372 Other benign neoplasm of skin of left lower limb, including hip: Secondary | ICD-10-CM | POA: Diagnosis not present

## 2016-12-01 DIAGNOSIS — L718 Other rosacea: Secondary | ICD-10-CM | POA: Diagnosis not present

## 2016-12-01 DIAGNOSIS — D225 Melanocytic nevi of trunk: Secondary | ICD-10-CM | POA: Diagnosis not present

## 2016-12-01 DIAGNOSIS — D2262 Melanocytic nevi of left upper limb, including shoulder: Secondary | ICD-10-CM | POA: Diagnosis not present

## 2016-12-01 DIAGNOSIS — D2371 Other benign neoplasm of skin of right lower limb, including hip: Secondary | ICD-10-CM | POA: Diagnosis not present

## 2016-12-02 ENCOUNTER — Ambulatory Visit (HOSPITAL_BASED_OUTPATIENT_CLINIC_OR_DEPARTMENT_OTHER): Payer: PPO

## 2016-12-02 DIAGNOSIS — C3411 Malignant neoplasm of upper lobe, right bronchus or lung: Secondary | ICD-10-CM | POA: Diagnosis not present

## 2016-12-02 DIAGNOSIS — Z95828 Presence of other vascular implants and grafts: Secondary | ICD-10-CM

## 2016-12-02 MED ORDER — HEPARIN SOD (PORK) LOCK FLUSH 100 UNIT/ML IV SOLN
500.0000 [IU] | Freq: Once | INTRAVENOUS | Status: AC | PRN
  Administered 2016-12-02: 500 [IU] via INTRAVENOUS
  Filled 2016-12-02: qty 5

## 2016-12-02 MED ORDER — SODIUM CHLORIDE 0.9 % IJ SOLN
10.0000 mL | INTRAMUSCULAR | Status: DC | PRN
  Administered 2016-12-02: 10 mL via INTRAVENOUS
  Filled 2016-12-02: qty 10

## 2017-01-06 ENCOUNTER — Ambulatory Visit: Payer: PPO

## 2017-01-06 ENCOUNTER — Ambulatory Visit (HOSPITAL_COMMUNITY)
Admission: RE | Admit: 2017-01-06 | Discharge: 2017-01-06 | Disposition: A | Discharge status: Home / Self Care | Payer: PPO | Source: Ambulatory Visit | Attending: Internal Medicine | Admitting: Internal Medicine

## 2017-01-06 ENCOUNTER — Other Ambulatory Visit (HOSPITAL_BASED_OUTPATIENT_CLINIC_OR_DEPARTMENT_OTHER): Payer: PPO

## 2017-01-06 DIAGNOSIS — K8020 Calculus of gallbladder without cholecystitis without obstruction: Secondary | ICD-10-CM | POA: Diagnosis not present

## 2017-01-06 DIAGNOSIS — C3411 Malignant neoplasm of upper lobe, right bronchus or lung: Secondary | ICD-10-CM

## 2017-01-06 DIAGNOSIS — I700 Atherosclerosis of aorta: Secondary | ICD-10-CM | POA: Insufficient documentation

## 2017-01-06 DIAGNOSIS — I2510 Atherosclerotic heart disease of native coronary artery without angina pectoris: Secondary | ICD-10-CM | POA: Diagnosis not present

## 2017-01-06 DIAGNOSIS — Z95828 Presence of other vascular implants and grafts: Secondary | ICD-10-CM

## 2017-01-06 DIAGNOSIS — C3491 Malignant neoplasm of unspecified part of right bronchus or lung: Secondary | ICD-10-CM | POA: Diagnosis not present

## 2017-01-06 DIAGNOSIS — K7460 Unspecified cirrhosis of liver: Secondary | ICD-10-CM | POA: Insufficient documentation

## 2017-01-06 LAB — COMPREHENSIVE METABOLIC PANEL
ALBUMIN: 3.4 g/dL — AB (ref 3.5–5.0)
ALK PHOS: 147 U/L (ref 40–150)
ALT: 32 U/L (ref 0–55)
AST: 39 U/L — ABNORMAL HIGH (ref 5–34)
Anion Gap: 6 mEq/L (ref 3–11)
BILIRUBIN TOTAL: 1.57 mg/dL — AB (ref 0.20–1.20)
BUN: 14.4 mg/dL (ref 7.0–26.0)
CO2: 24 meq/L (ref 22–29)
CREATININE: 0.8 mg/dL (ref 0.7–1.3)
Calcium: 8.8 mg/dL (ref 8.4–10.4)
Chloride: 110 mEq/L — ABNORMAL HIGH (ref 98–109)
EGFR: 88 mL/min/{1.73_m2} — AB (ref >90)
GLUCOSE: 87 mg/dL (ref 70–140)
Potassium: 4.3 mEq/L (ref 3.5–5.1)
SODIUM: 140 meq/L (ref 136–145)
TOTAL PROTEIN: 6.9 g/dL (ref 6.4–8.3)

## 2017-01-06 LAB — CBC WITH DIFFERENTIAL/PLATELET
BASO%: 0.7 % (ref 0.0–2.0)
Basophils Absolute: 0 10*3/uL (ref 0.0–0.1)
EOS ABS: 0.1 10*3/uL (ref 0.0–0.5)
EOS%: 2.7 % (ref 0.0–7.0)
HCT: 44.5 % (ref 38.4–49.9)
HEMOGLOBIN: 15.1 g/dL (ref 13.0–17.1)
LYMPH%: 34.3 % (ref 14.0–49.0)
MCH: 33.2 pg (ref 27.2–33.4)
MCHC: 33.9 g/dL (ref 32.0–36.0)
MCV: 97.8 fL (ref 79.3–98.0)
MONO#: 0.5 10*3/uL (ref 0.1–0.9)
MONO%: 10.5 % (ref 0.0–14.0)
NEUT%: 51.8 % (ref 39.0–75.0)
NEUTROS ABS: 2.5 10*3/uL (ref 1.5–6.5)
PLATELETS: 118 10*3/uL — AB (ref 140–400)
RBC: 4.55 10*6/uL (ref 4.20–5.82)
RDW: 14 % (ref 11.0–14.6)
WBC: 4.8 10*3/uL (ref 4.0–10.3)
lymph#: 1.7 10*3/uL (ref 0.9–3.3)

## 2017-01-06 MED ORDER — SODIUM CHLORIDE 0.9 % IJ SOLN
10.0000 mL | INTRAMUSCULAR | Status: DC | PRN
  Administered 2017-01-06: 10 mL via INTRAVENOUS
  Filled 2017-01-06: qty 10

## 2017-01-06 MED ORDER — HEPARIN SOD (PORK) LOCK FLUSH 100 UNIT/ML IV SOLN
INTRAVENOUS | Status: AC
  Filled 2017-01-06: qty 5

## 2017-01-06 MED ORDER — IOPAMIDOL (ISOVUE-300) INJECTION 61%
INTRAVENOUS | Status: AC
  Administered 2017-01-06: 75 mL
  Filled 2017-01-06: qty 75

## 2017-01-06 MED ORDER — HEPARIN SOD (PORK) LOCK FLUSH 100 UNIT/ML IV SOLN
500.0000 [IU] | Freq: Once | INTRAVENOUS | Status: AC
  Administered 2017-01-06: 500 [IU] via INTRAVENOUS

## 2017-01-13 ENCOUNTER — Telehealth: Payer: Self-pay | Admitting: Internal Medicine

## 2017-01-13 ENCOUNTER — Encounter: Payer: Self-pay | Admitting: Internal Medicine

## 2017-01-13 ENCOUNTER — Ambulatory Visit (HOSPITAL_BASED_OUTPATIENT_CLINIC_OR_DEPARTMENT_OTHER): Payer: PPO | Admitting: Internal Medicine

## 2017-01-13 VITALS — BP 129/80 | HR 80 | Temp 98.1°F | Resp 20 | Ht 73.0 in | Wt 210.7 lb

## 2017-01-13 DIAGNOSIS — C3411 Malignant neoplasm of upper lobe, right bronchus or lung: Secondary | ICD-10-CM

## 2017-01-13 DIAGNOSIS — R5383 Other fatigue: Secondary | ICD-10-CM | POA: Diagnosis not present

## 2017-01-13 DIAGNOSIS — C3491 Malignant neoplasm of unspecified part of right bronchus or lung: Secondary | ICD-10-CM

## 2017-01-13 DIAGNOSIS — R0602 Shortness of breath: Secondary | ICD-10-CM | POA: Diagnosis not present

## 2017-01-13 MED ORDER — ALTEPLASE 2 MG IJ SOLR
2.0000 mg | Freq: Once | INTRAMUSCULAR | Status: DC | PRN
  Filled 2017-01-13: qty 2

## 2017-01-13 MED ORDER — HEPARIN SOD (PORK) LOCK FLUSH 100 UNIT/ML IV SOLN
500.0000 [IU] | Freq: Once | INTRAVENOUS | Status: DC
  Filled 2017-01-13: qty 5

## 2017-01-13 MED ORDER — SODIUM CHLORIDE 0.9% FLUSH
10.0000 mL | INTRAVENOUS | Status: DC | PRN
  Filled 2017-01-13: qty 10

## 2017-01-13 NOTE — Progress Notes (Signed)
[place] [phone]   [phone]  OFFICE PROGRESS NOTE  [name], MD [place] [address]  DIAGNOSIS: Stage IIIA/IIIB non-small cell lung cancer diagnosed in December 2012.  PRIOR THERAPY:  1) Concurrent chemoradiation with chemotherapy in the form of weekly carboplatin for an AUC of 2 and paclitaxel at 45 mg/m2.  2) Consolidation chemotherapy with carboplatin for AUC of 5 and paclitaxel 175 mg/M2 every 3 weeks with Neulasta support, status post 3 cycles, last cycle was given on 01/05/2012 with partial response.   CURRENT THERAPY: Observation.  INTERVAL HISTORY: Joseph [name] 76 y.o. male returns to the clinic today for follow-up visit accompanied by his wife. The patient is feeling fine today with no specific complaints except for mild fatigue and shortness of breath with exertion. He denied having any chest pain, cough or hemoptysis. He denied having any fever or chills. He has no nausea, vomiting, diarrhea or constipation. He had repeat CT scan of the chest performed recently and he is here for evaluation and discussion of his scan results.  MEDICAL HISTORY: Past Medical History:  Diagnosis Date  . Acute on chronic diastolic CHF (congestive heart failure), NYHA class 3 ([place]) 07/23/2014  . Arthritis    left knee and back arthrtis  . Atrial fibrillation with rapid ventricular response ([place]) 07/04/2014  . CAD (coronary artery disease)   . Cataract   . Cirrhosis ([place])    By radiography, seen on CT  . Diverticulosis   . Emphysema   . Enlarged prostate   . Heart murmur    teen  . History of colon polyps 2006,2008  . History of transfusion 2015  . Hx of radiation therapy 09/02/11 to 10/19/11   chest  . Hx of radiation therapy   . Hyperlipidemia    takes Pravastatin  . Hypothyroidism    takes Synthroid daily  . Lung cancer ([place])    right upper lobe  . Personal history of adenomatous colonic polyps  07/04/2012   2006, adenomatous right colon polyp removed, then ablated in 2007 followup, 2008, adenomatous polyp of the right colon removed with hot biopsy forceps. All by Dr. Lajoyce Corners 01/03/2013 diminutive polyp ascending colon - adenoma    . Rosacea   . Shortness of breath    with exertion  . Small bowel obstruction 07/05/2014  . Status post chemotherapy    last chemo 2013  . Umbilical hernia, incarcerated- surgery 07/10/14 07/05/2014    ALLERGIES:  has No Known Allergies.  MEDICATIONS:  Current Outpatient Prescriptions  Medication Sig Dispense Refill  . acetaminophen (TYLENOL) 325 MG tablet Take 2 tablets (650 mg total) by mouth every 6 (six) hours as needed for mild pain (or Fever >/= 101). 40 tablet 0  . acidophilus (RISAQUAD) CAPS capsule Take 1 capsule by mouth daily.    Marland Kitchen albuterol (PROVENTIL HFA;VENTOLIN HFA) 108 (90 Base) MCG/ACT inhaler Inhale 1-2 puffs into the lungs every 6 (six) hours as needed for wheezing or shortness of breath.    Marland Kitchen aspirin EC 81 MG tablet Take 1 tablet (81 mg total) by mouth daily. 90 tablet 3  . furosemide (LASIX) 20 MG tablet Take 1 tablet (20 mg total) by mouth daily. 90 tablet 1  . HYDROcodone-acetaminophen (NORCO/VICODIN) 5-325 MG tablet Take 1 tablet by mouth every 8 (eight) hours as needed for severe pain. 15 tablet 0  . L-ARGININE PO Take 1 tablet by mouth 2 (two) times daily.     Marland Kitchen  metoprolol tartrate (LOPRESSOR) 25 MG tablet Take 25 mg by mouth 2 (two) times daily.    . Multiple Vitamin (MULTIVITAMIN WITH MINERALS) TABS tablet Take 1 tablet by mouth daily.    Marland Kitchen nystatin-triamcinolone (MYCOLOG II) cream Apply 1 application topically 2 (two) times daily as needed (for itching).     Marland Kitchen OVER THE COUNTER MEDICATION Take 2 tablets by mouth 2 (two) times daily. Medication:  Lung Support    . polyethylene glycol (MIRALAX / GLYCOLAX) packet Take 17 g by mouth daily as needed for mild constipation. 28 each 0  . Specialty Vitamins Products (PROSTATE PO) Take 1  tablet by mouth 2 (two) times daily.    Marland Kitchen SYNTHROID 175 MCG tablet Take 175 mcg by mouth daily before breakfast.     . tiotropium (SPIRIVA) 18 MCG inhalation capsule Place 18 mcg into inhaler and inhale daily.     . vitamin B-12 (CYANOCOBALAMIN) 1000 MCG tablet Take 1,000 mcg by mouth daily.     Current Facility-Administered Medications  Medication Dose Route Frequency Provider Last Rate Last Dose  . alteplase (CATHFLO ACTIVASE) injection 2 mg  2 mg Intracatheter Once PRN Curt Bears, MD      . heparin lock flush 100 unit/mL  500 Units Intravenous Once Curt Bears, MD      . sodium chloride flush (NS) 0.9 % injection 10 mL  10 mL Intravenous PRN Curt Bears, MD        SURGICAL HISTORY:  Past Surgical History:  Procedure Laterality Date  . ABDOMINAL AORTAGRAM  10/20/2013   Procedure: ABDOMINAL Maxcine Ham;  Surgeon: Elam Dutch, MD;  Location: [facility] CATH LAB;  Service: Cardiovascular;;  . ABDOMINAL AORTIC ANEURYSM REPAIR N/A 10/31/2013   Procedure: aorto to left external iliac and right external iliac and left internal iliac, reimplantation of inferior mesenteric artery and ligation of left iliac artery aneursym;  Surgeon: Elam Dutch, MD;  Location: [place];  Service: Vascular;  Laterality: N/A;  . ABDOMINAL WOUND DEHISCENCE N/A 11/10/2013   Procedure: ABDOMINAL WOUND CLOSURE ;  Surgeon: Serafina Mitchell, MD;  Location: [place];  Service: Vascular;  Laterality: N/A;  . bletharoplasty    . BOWEL RESECTION N/A 07/10/2014   Procedure: SMALL BOWEL RESECTION;  Surgeon: [name], MD;  Location: [place];  Service: General;  Laterality: N/A;  . CARDIAC CATHETERIZATION  2003  . COLONOSCOPY W/ BIOPSIES     multiple   . ESOPHAGOGASTRODUODENOSCOPY  2006  . FINGER SURGERY  2008   left pointer finger  . HERNIA REPAIR  70's  . INCISIONAL HERNIA REPAIR N/A 07/10/2014   Procedure: HERNIA REPAIR INCISIONAL;  Surgeon: [name], MD;  Location: [place];  Service: General;  Laterality:  N/A;  . KNEE ARTHROSCOPY  2008   left knee  . LAPAROTOMY N/A 07/10/2014   Procedure: EXPLORATORY LAPAROTOMY;  Surgeon: [name], MD;  Location: [place];  Service: General;  Laterality: N/A;  . LOWER EXTREMITY ANGIOGRAM Bilateral 10/20/2013   Procedure: LOWER EXTREMITY ANGIOGRAM;  Surgeon: Elam Dutch, MD;  Location: [facility] CATH LAB;  Service: Cardiovascular;  Laterality: Bilateral;  . LYSIS OF ADHESION N/A 07/10/2014   Procedure: LYSIS OF ADHESION - 90 minutes;  Surgeon: [name], MD;  Location: [place];  Service: General;  Laterality: N/A;  . PORTACATH PLACEMENT  08/10/2011   Procedure: INSERTION PORT-A-CATH;  Surgeon: Pierre Bali, MD;  Location: [place];  Service: Thoracic;  Laterality: Left;  Left Subclavian  . PTOSIS  REPAIR Bilateral 12-02-2015  . THROMBECTOMY ILIAC ARTERY Bilateral 10/31/2013   Procedure: THROMBECTOMY ILIAC ARTERY;  Surgeon: Elam Dutch, MD;  Location: [place];  Service: Vascular;  Laterality: Bilateral;  . TONSILLECTOMY     as a child  . VASECTOMY  70's  . VENTRAL HERNIA REPAIR N/A 04/04/2015   Procedure: OPEN REPAIR OF RECURRENT ABDOMINAL WALL HERNIA WITH MESH, COMPLEX LYSIS OF ADHESIONS X1 HR TAR COMPONENT SEPARATION;  Surgeon: Greer Pickerel, MD;  Location: [facility];  Service: General;  Laterality: N/A;    REVIEW OF SYSTEMS:  A comprehensive review of systems was negative except for: Constitutional: positive for fatigue Respiratory: positive for dyspnea on exertion Musculoskeletal: positive for arthralgias   PHYSICAL EXAMINATION: General appearance: alert, cooperative and no distress Head: Normocephalic, without obvious abnormality, atraumatic Neck: no adenopathy Lymph nodes: Cervical, supraclavicular, and axillary nodes normal. Resp: clear to auscultation bilaterally Back: symmetric, no curvature. ROM normal. No CVA tenderness. Cardio: regular rate and rhythm, S1, S2 normal, no murmur, click, rub or gallop GI: soft, non-tender; bowel sounds normal; no  masses,  no organomegaly Extremities: extremities normal, atraumatic, no cyanosis or edema  ECOG PERFORMANCE STATUS: 1 - Symptomatic but completely ambulatory  Blood pressure 129/80, pulse 80, temperature 98.1 F (36.7 C), temperature source Oral, resp. rate 20, height '6\' 1"'$  (1.854 m), weight 210 lb 11.2 oz (95.6 kg), SpO2 92 %.  LABORATORY DATA: Lab Results  Component Value Date   WBC 4.8 01/06/2017   HGB 15.1 01/06/2017   HCT 44.5 01/06/2017   MCV 97.8 01/06/2017   PLT 118 (L) 01/06/2017      Chemistry      Component Value Date/Time   NA 140 01/06/2017 1105   K 4.3 01/06/2017 1105   CL 108 10/25/2016 0346   CL 109 (H) 08/01/2012 1046   CO2 24 01/06/2017 1105   BUN 14.4 01/06/2017 1105   CREATININE 0.8 01/06/2017 1105      Component Value Date/Time   CALCIUM 8.8 01/06/2017 1105   ALKPHOS 147 01/06/2017 1105   AST 39 (H) 01/06/2017 1105   ALT 32 01/06/2017 1105   BILITOT 1.57 (H) 01/06/2017 1105       RADIOGRAPHIC STUDIES: Ct Chest W Contrast  Result Date: 01/06/2017 CLINICAL DATA:  Right-sided lung cancer diagnosed in 2012. Right upper lobe primary. EXAM: CT CHEST WITH CONTRAST TECHNIQUE: Multidetector CT imaging of the chest was performed during intravenous contrast administration. CONTRAST:  71m ISOVUE-300 IOPAMIDOL (ISOVUE-300) INJECTION 61% COMPARISON:  10/23/2016 CT to rule out pulmonary embolism. Restaging CT of 07/02/2016. FINDINGS: Cardiovascular: Advanced aortic and branch vessel atherosclerosis. No central pulmonary embolism, on this non-dedicated study. Normal heart size, without pericardial effusion. Multivessel coronary artery atherosclerosis. Left Port-A-Cath which terminates the high SVC. Mediastinum/Nodes: No supraclavicular adenopathy. No mediastinal or hilar adenopathy. Lungs/Pleura: No pleural fluid. Secretions again identified within the dependent upper trachea. The more nodular component on the prior exam has resolved. Lower lobe predominant bronchial  wall thickening. Advanced bullous type emphysema. 9 mm nodule along the right minor fissure is unchanged on image 76/ series 5. Similar radiation fibrosis about the medial right apex and posteromedial right upper lobe. No locally recurrent disease. Clear left lung. Upper Abdomen: Cirrhosis. Normal imaged portions of the spleen, stomach, pancreas, adrenal glands, kidneys. Similar mild gallbladder distension. A least 1 small gallstone. Musculoskeletal: No acute osseous abnormality. IMPRESSION: 1. 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 2. Secretions in the dependent trachea, with decreased  nodular component since the prior exam. 3.  Coronary artery atherosclerosis. Aortic atherosclerosis. 4. Cirrhosis. 5. Cholelithiasis. Electronically Signed   By: Abigail Miyamoto M.D.   On: 01/06/2017 14:31   ASSESSMENT AND PLAN:  This is a very pleasant 76 years old white male with stage IIIB non-small cell lung cancer status post concurrent chemoradiation followed by consolidation chemotherapy and has been observation since May 2013. He had repeat CT scan of the chest performed recently. The scan showed no evidence for disease recurrence. I discussed the scan results with the patient and his wife. I recommended for him to continue on observation with repeat CT scan of the chest in one year. He will continue to have Port-A-Cath flush every 2 months during this period. The patient was advised to call immediately if he has any concerning symptoms in the interval. All questions were answered. The patient knows to call the clinic with any problems, questions or concerns. We can certainly see the patient much sooner if necessary. I spent 10 minutes counseling the patient face to face. The total time spent in the appointment was 15 minutes.  Disclaimer: This note was dictated with voice recognition software. Similar sounding words can inadvertently be transcribed and may not  be corrected upon review.

## 2017-01-13 NOTE — Telephone Encounter (Signed)
Gave patient AVS and calender per 5/16 los. [facility] to contact patient with ct schedule.

## 2017-01-27 DIAGNOSIS — M533 Sacrococcygeal disorders, not elsewhere classified: Secondary | ICD-10-CM | POA: Diagnosis not present

## 2017-01-27 DIAGNOSIS — Z6829 Body mass index (BMI) 29.0-29.9, adult: Secondary | ICD-10-CM | POA: Diagnosis not present

## 2017-01-27 DIAGNOSIS — R972 Elevated prostate specific antigen [PSA]: Secondary | ICD-10-CM | POA: Diagnosis not present

## 2017-02-08 DIAGNOSIS — H2513 Age-related nuclear cataract, bilateral: Secondary | ICD-10-CM | POA: Diagnosis not present

## 2017-02-08 DIAGNOSIS — H2512 Age-related nuclear cataract, left eye: Secondary | ICD-10-CM | POA: Diagnosis not present

## 2017-02-08 DIAGNOSIS — H25013 Cortical age-related cataract, bilateral: Secondary | ICD-10-CM | POA: Diagnosis not present

## 2017-02-08 DIAGNOSIS — H35033 Hypertensive retinopathy, bilateral: Secondary | ICD-10-CM | POA: Diagnosis not present

## 2017-02-08 DIAGNOSIS — H353122 Nonexudative age-related macular degeneration, left eye, intermediate dry stage: Secondary | ICD-10-CM | POA: Diagnosis not present

## 2017-02-09 ENCOUNTER — Encounter (INDEPENDENT_AMBULATORY_CARE_PROVIDER_SITE_OTHER): Payer: Self-pay | Admitting: Orthopaedic Surgery

## 2017-02-09 ENCOUNTER — Ambulatory Visit (INDEPENDENT_AMBULATORY_CARE_PROVIDER_SITE_OTHER): Payer: PPO | Admitting: Orthopaedic Surgery

## 2017-02-09 ENCOUNTER — Ambulatory Visit (INDEPENDENT_AMBULATORY_CARE_PROVIDER_SITE_OTHER): Payer: Medicare Other | Admitting: Orthopaedic Surgery

## 2017-02-09 ENCOUNTER — Ambulatory Visit (INDEPENDENT_AMBULATORY_CARE_PROVIDER_SITE_OTHER): Payer: PPO

## 2017-02-09 VITALS — BP 101/66 | HR 87 | Ht 72.0 in | Wt 210.0 lb

## 2017-02-09 DIAGNOSIS — G8929 Other chronic pain: Secondary | ICD-10-CM

## 2017-02-09 DIAGNOSIS — M545 Low back pain: Secondary | ICD-10-CM | POA: Diagnosis not present

## 2017-02-09 MED ORDER — LIDOCAINE HCL 2 % IJ SOLN
2.0000 mL | INTRAMUSCULAR | Status: AC | PRN
  Administered 2017-02-09: 2 mL

## 2017-02-09 MED ORDER — METHYLPREDNISOLONE ACETATE 40 MG/ML IJ SUSP
40.0000 mg | INTRAMUSCULAR | Status: AC | PRN
  Administered 2017-02-09: 40 mg via INTRAMUSCULAR

## 2017-02-09 NOTE — Progress Notes (Signed)
Office Visit Note   Patient: Joseph [name]           Date of Birth: November 18, 1940           MRN: [id number] Visit Date: 02/09/2017              Requested by: Joseph Redwood, MD [address] [place], [place] [address] PCP: Joseph Redwood, MD   Assessment & Plan: Visit Diagnoses:  1. Chronic bilateral low back pain without sciatica   Spondylolisthesis L5 on S1 with degenerative changes at L4-5 and 51  Plan: Local cortisone injection in  parasacral area. Physical therapy for low back exercises return to office in 4-6 weeks and if no improvement consider evaluation by Joseph [name]  Follow-Up Instructions: Return if symptoms worsen or fail to improve.   Orders:  Orders Placed This Encounter  Procedures  . XR Lumbar Spine 2-3 Views   No orders of the defined types were placed in this encounter.     Procedures: Trigger Point Inj Date/Time: 02/09/2017 11:53 AM Performed by: Joseph [name] Authorized by: Joseph [name]   Consent Given by:  Patient Total # of Trigger Points:  1 Location: back   Needle Size:  25 G Approach:  Dorsal Medications #1:  2 mL lidocaine 2 %; 40 mg methylPREDNISolone acetate 40 MG/ML     Clinical Data: No additional findings.   Subjective: Chief Complaint  Patient presents with  . Lower Back - Pain    Mr. Joseph [name] is a 76 y o y that presents with right sided  low back pain that radiates into his Right buttock. No injury,fever, chills, no groin pain.  Germaine visit she office today for evaluation of low back pain. He first noted onset over the last several months that was insidious.injury or trauma. He had a CT scan of his abdomen  in early February demonstrating a spondylolisthesis of L5 on S1. Degenerative changes at L4-5 and L5-S1 were also identified. His pain is basically localizing the lumbar region was some referred pain to the right parasacral region. He's not had any buttock pain or discomfort to either lower extremity. His  abdominal complaints are presently stable  HPI  Review of Systems   Objective: Vital Signs: BP 101/66   Pulse 87   Ht 6' (1.829 m)   Wt 210 lb (95.3 kg)   BMI 28.48 kg/m   Physical Exam  Ortho Exam straight leg raise negative bilaterally painless range of motion both hips. No percussible tenderness of lumbar spine but does have a painful nodule on the right parasacral region which is very uncomfortable.skin In intact.  Specialty Comments:  No specialty comments available.  Imaging: Xr Lumbar Spine 2-3 Views  Result Date: 02/09/2017 Films of the lumbar spine obtained in 2 projections. There are degenerative changes at L4-5 and L5-S1. Slight decrease in the disc space height between L4-5 and L5-S1. Grade 1 spondylolisthesis L5 on S1 with what appears to be a pars defect.    PMFS History: Patient Active Problem List   Diagnosis Date Noted  . Elevated d-dimer   . Hypoxia   . SOB (shortness of breath)   . Small bowel obstruction ([place]) 10/20/2016  . AKI (acute kidney injury) ([place]) 10/20/2016  . Acute respiratory failure with hypoxia ([place]) 10/20/2016  . Chronic pain of left knee 07/06/2016  . Port catheter in place 02/27/2016  . Claudication ([place]) 02/18/2016  . Statin intolerance 02/18/2016  . Essential hypertension 02/18/2016  . Coronary artery  disease due to lipid rich plaque 08/29/2015  . Acute on chronic diastolic CHF (congestive heart failure), NYHA class 2 ([place]) 08/29/2015  . 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 Hypoxemia requiring supplemental oxygen   . Atrial fibrillation ([place]) 07/23/2014  . COPD (chronic obstructive pulmonary disease) ([place]) 07/06/2014  . Hypothyroidism 07/06/2014  . Numbness-Left Thigh 12/14/2013  . AAA- s/p Ao-Iliac BPG March 2015 10/20/2013  . Hyperlipidemia 10/09/2013  . Hepatic cirrhosis ([place]) 12/02/2012  . Cancer of right lung ([place]) 08/06/2011   Past Medical History:  Diagnosis Date    . Acute on chronic diastolic CHF (congestive heart failure), NYHA class 3 ([place]) 07/23/2014  . Arthritis    left knee and back arthrtis  . Atrial fibrillation with rapid ventricular response ([place]) 07/04/2014  . CAD (coronary artery disease)   . Cataract   . Cirrhosis ([place])    By radiography, seen on CT  . Diverticulosis   . Emphysema   . Enlarged prostate   . Heart murmur    teen  . History of colon polyps 2006,2008  . History of transfusion 2015  . Hx of radiation therapy 09/02/11 to 10/19/11   chest  . Hx of radiation therapy   . Hyperlipidemia    takes Pravastatin  . Hypothyroidism    takes Synthroid daily  . Lung cancer ([place])    right upper lobe  . Personal history of adenomatous colonic polyps 07/04/2012   2006, adenomatous right colon polyp removed, then ablated in 2007 followup, 2008, adenomatous polyp of the right colon removed with hot biopsy forceps. All by Dr. Lajoyce Corners 01/03/2013 diminutive polyp ascending colon - adenoma    . Rosacea   . Shortness of breath    with exertion  . Small bowel obstruction ([place]) 07/05/2014  . Status post chemotherapy    last chemo 2013  . Umbilical hernia, incarcerated- surgery 07/10/14 07/05/2014    Family History  Problem Relation Age of Onset  . Cancer Father   . Diabetes Father   . Hyperlipidemia Mother   . Hypertension Mother   . Hyperlipidemia Brother   . AAA (abdominal aortic aneurysm) Brother   . Hypertension Brother   . Hyperlipidemia Sister   . Hypertension Sister   . Anesthesia problems Neg Hx   . Hypotension Neg Hx   . Malignant hyperthermia Neg Hx   . Pseudochol deficiency Neg Hx   . Colon cancer Neg Hx     Past Surgical History:  Procedure Laterality Date  . ABDOMINAL AORTAGRAM  10/20/2013   Procedure: ABDOMINAL Maxcine Ham;  Surgeon: Elam Dutch, MD;  Location[facility] CATH LAB;  Service: Cardiovascular;;  . ABDOMINAL AORTIC ANEURYSM REPAIR N/A 10/31/2013   Procedure: aorto to left external iliac and right external iliac and  left internal iliac, reimplantation of inferior mesenteric artery and ligation of left iliac artery aneursym;  Surgeon: Elam Dutch, MD;  Location: [place];  Service: Vascular;  Laterality: N/A;  . ABDOMINAL WOUND DEHISCENCE N/A 11/10/2013   Procedure: ABDOMINAL WOUND CLOSURE ;  Surgeon: Serafina Mitchell, MD;  Location: [place];  Service: Vascular;  Laterality: N/A;  . bletharoplasty    . BOWEL RESECTION N/A 07/10/2014   Procedure: SMALL BOWEL RESECTION;  Surgeon: [name], MD;  Location: [place];  Service: General;  Laterality: N/A;  . CARDIAC CATHETERIZATION  2003  . COLONOSCOPY W/ BIOPSIES     multiple   . ESOPHAGOGASTRODUODENOSCOPY  2006  . FINGER  SURGERY  2008   left pointer finger  . HERNIA REPAIR  70's  . INCISIONAL HERNIA REPAIR N/A 07/10/2014   Procedure: HERNIA REPAIR INCISIONAL;  Surgeon: [name], MD;  Location: [place];  Service: General;  Laterality: N/A;  . KNEE ARTHROSCOPY  2008   left knee  . LAPAROTOMY N/A 07/10/2014   Procedure: EXPLORATORY LAPAROTOMY;  Surgeon: [name], MD;  Location: [place];  Service: General;  Laterality: N/A;  . LOWER EXTREMITY ANGIOGRAM Bilateral 10/20/2013   Procedure: LOWER EXTREMITY ANGIOGRAM;  Surgeon: Elam Dutch, MD;  Location: [facility] CATH LAB;  Service: Cardiovascular;  Laterality: Bilateral;  . LYSIS OF ADHESION N/A 07/10/2014   Procedure: LYSIS OF ADHESION - 90 minutes;  Surgeon: [name], MD;  Location: [place];  Service: General;  Laterality: N/A;  . PORTACATH PLACEMENT  08/10/2011   Procedure: INSERTION PORT-A-CATH;  Surgeon: Pierre Bali, MD;  Location: [place];  Service: Thoracic;  Laterality: Left;  Left Subclavian  . PTOSIS REPAIR Bilateral 12-02-2015  . THROMBECTOMY ILIAC ARTERY Bilateral 10/31/2013   Procedure: THROMBECTOMY ILIAC ARTERY;  Surgeon: Elam Dutch, MD;  Location: [place];  Service: Vascular;  Laterality: Bilateral;  . TONSILLECTOMY     as a child  . VASECTOMY  70's  . VENTRAL HERNIA REPAIR N/A  04/04/2015   Procedure: OPEN REPAIR OF RECURRENT ABDOMINAL WALL HERNIA WITH MESH, COMPLEX LYSIS OF ADHESIONS X1 HR TAR COMPONENT SEPARATION;  Surgeon: Greer Pickerel, MD;  Location: [facility];  Service: General;  Laterality: N/A;   Social History   Occupational History  . Not on file.   Social History Main Topics  . Smoking status: Former Smoker    Packs/day: 1.50    Years: 40.00    Types: Cigarettes    Quit date: 07/02/2011  . Smokeless tobacco: Former Systems developer  . Alcohol use 0.0 oz/week     Comment: social  . Drug use: No  . Sexual activity: No     Joseph Balding, MD   Note - This record has been created using [employer].  Chart creation errors have been sought, but may not always  have been located. Such creation errors do not reflect on  the standard of medical care.

## 2017-02-11 ENCOUNTER — Other Ambulatory Visit (INDEPENDENT_AMBULATORY_CARE_PROVIDER_SITE_OTHER): Payer: Self-pay

## 2017-02-11 DIAGNOSIS — M5440 Lumbago with sciatica, unspecified side: Secondary | ICD-10-CM

## 2017-02-18 ENCOUNTER — Ambulatory Visit: Payer: PPO | Attending: Internal Medicine

## 2017-02-18 DIAGNOSIS — R1084 Generalized abdominal pain: Secondary | ICD-10-CM | POA: Diagnosis not present

## 2017-02-18 DIAGNOSIS — M25652 Stiffness of left hip, not elsewhere classified: Secondary | ICD-10-CM

## 2017-02-18 DIAGNOSIS — M62830 Muscle spasm of back: Secondary | ICD-10-CM | POA: Diagnosis not present

## 2017-02-18 DIAGNOSIS — M545 Low back pain: Secondary | ICD-10-CM | POA: Diagnosis not present

## 2017-02-18 DIAGNOSIS — R293 Abnormal posture: Secondary | ICD-10-CM | POA: Diagnosis not present

## 2017-02-18 DIAGNOSIS — G8929 Other chronic pain: Secondary | ICD-10-CM

## 2017-02-18 DIAGNOSIS — M25651 Stiffness of right hip, not elsewhere classified: Secondary | ICD-10-CM

## 2017-02-18 NOTE — Patient Instructions (Signed)
Issued 2x/day 3 reps 30 sec knee to chest and 3x10 sec MET with push into knee RT for ant ilia

## 2017-02-18 NOTE — Therapy (Signed)
[place], [place], [address] Phone: [phone]   Fax:  [phone]  Physical Therapy Evaluation  Patient Details  Name: Joseph [name] MRN: [id number] Date of Birth: [birth year] Referring Provider: [name], MD  Encounter Date: 02/18/2017      PT End of Session - 02/18/17 1234    Visit Number 1   Number of Visits 12   Date for PT Re-Evaluation 04/02/17   Authorization Type [employer]   PT Start Time 1140   PT Stop Time 1230   PT Time Calculation (min) 50 min   Activity Tolerance Patient tolerated treatment well;No increased pain   Behavior During Therapy WFL for tasks assessed/performed      Past Medical History:  Diagnosis Date  . Acute on chronic diastolic CHF (congestive heart failure), NYHA class 3 ([place]) 07/23/2014  . Arthritis    left knee and back arthrtis  . Atrial fibrillation with rapid ventricular response ([place]) 07/04/2014  . CAD (coronary artery disease)   . Cataract   . Cirrhosis ([place])    By radiography, seen on CT  . Diverticulosis   . Emphysema   . Enlarged prostate   . Heart murmur    teen  . History of colon polyps 2006,2008  . History of transfusion 2015  . Hx of radiation therapy 09/02/11 to 10/19/11   chest  . Hx of radiation therapy   . Hyperlipidemia    takes Pravastatin  . Hypothyroidism    takes Synthroid daily  . Lung cancer ([place])    right upper lobe  . Personal history of adenomatous colonic polyps 07/04/2012   2006, adenomatous right colon polyp removed, then ablated in 2007 followup, 2008, adenomatous polyp of the right colon removed with hot biopsy forceps. All by Dr. Lajoyce Corners 01/03/2013 diminutive polyp ascending colon - adenoma    . Rosacea   . Shortness of breath    with exertion  . Small bowel obstruction ([place]) 07/05/2014  . Status post chemotherapy    last chemo 2013  . Umbilical hernia, incarcerated- surgery 07/10/14 07/05/2014    Past Surgical  History:  Procedure Laterality Date  . ABDOMINAL AORTAGRAM  10/20/2013   Procedure: ABDOMINAL Maxcine Ham;  Surgeon: Elam Dutch, MD;  Location: [facility] CATH LAB;  Service: Cardiovascular;;  . ABDOMINAL AORTIC ANEURYSM REPAIR N/A 10/31/2013   Procedure: aorto to left external iliac and right external iliac and left internal iliac, reimplantation of inferior mesenteric artery and ligation of left iliac artery aneursym;  Surgeon: Elam Dutch, MD;  Location: [place];  Service: Vascular;  Laterality: N/A;  . ABDOMINAL WOUND DEHISCENCE N/A 11/10/2013   Procedure: ABDOMINAL WOUND CLOSURE ;  Surgeon: Serafina Mitchell, MD;  Location: [place];  Service: Vascular;  Laterality: N/A;  . bletharoplasty    . BOWEL RESECTION N/A 07/10/2014   Procedure: SMALL BOWEL RESECTION;  Surgeon: [name], MD;  Location: [place];  Service: General;  Laterality: N/A;  . CARDIAC CATHETERIZATION  2003  . COLONOSCOPY W/ BIOPSIES     multiple   . ESOPHAGOGASTRODUODENOSCOPY  2006  . FINGER SURGERY  2008   left pointer finger  . HERNIA REPAIR  70's  . INCISIONAL HERNIA REPAIR N/A 07/10/2014   Procedure: HERNIA REPAIR INCISIONAL;  Surgeon: [name], MD;  Location: [place];  Service: General;  Laterality: N/A;  . KNEE ARTHROSCOPY  2008   left knee  . LAPAROTOMY N/A 07/10/2014   Procedure: EXPLORATORY LAPAROTOMY;  Surgeon: [name], MD;  Location: [place];  Service: General;  Laterality: N/A;  . LOWER EXTREMITY ANGIOGRAM Bilateral 10/20/2013   Procedure: LOWER EXTREMITY ANGIOGRAM;  Surgeon: Elam Dutch, MD;  Location: [facility] CATH LAB;  Service: Cardiovascular;  Laterality: Bilateral;  . LYSIS OF ADHESION N/A 07/10/2014   Procedure: LYSIS OF ADHESION - 90 minutes;  Surgeon: [name], MD;  Location: [place];  Service: General;  Laterality: N/A;  . PORTACATH PLACEMENT  08/10/2011   Procedure: INSERTION PORT-A-CATH;  Surgeon: Pierre Bali, MD;  Location: [place];  Service: Thoracic;  Laterality: Left;  Left Subclavian   . PTOSIS REPAIR Bilateral 12-02-2015  . THROMBECTOMY ILIAC ARTERY Bilateral 10/31/2013   Procedure: THROMBECTOMY ILIAC ARTERY;  Surgeon: Elam Dutch, MD;  Location: [place];  Service: Vascular;  Laterality: Bilateral;  . TONSILLECTOMY     as a child  . VASECTOMY  70's  . VENTRAL HERNIA REPAIR N/A 04/04/2015   Procedure: OPEN REPAIR OF RECURRENT ABDOMINAL WALL HERNIA WITH MESH, COMPLEX LYSIS OF ADHESIONS X1 HR TAR COMPONENT SEPARATION;  Surgeon: Greer Pickerel, MD;  [facility];  Service: General;  Laterality: N/A;    There were no vitals filed for this visit.       Subjective Assessment - 02/18/17 1143    Subjective He reports [name]  6 months ago he began to have pain at night affecting sleep. Suddenly began to have intermittant pain during day.   Saw MD who prescibed meds.  Saw Ortho Md . fracture L4-5  in past.   Cortisone injection  .  May need injection later deeper in spine.    Pertinent History Lung CA   Limitations --  Careful with activity   How long can you sit comfortably? As needed   Diagnostic tests XRay : negative   Patient Stated Goals Decrease [name]   Currently in Pain? Yes   Pain Score 6    Pain Location Back   Pain Orientation Right  but across some times   Pain Descriptors / Indicators Dull   Pain Type Chronic pain   Pain Onset More than a month ago   Pain Frequency Intermittent   Aggravating Factors  AM pain increased , sitting flexed   Pain Relieving Factors rest supine,     Multiple Pain Sites No            [facility] PT Assessment - 02/18/17 0001      Assessment   Medical Diagnosis [name] with sciatica   Referring Provider [name], MD     Precautions   Precautions None     Restrictions   Weight Bearing Restrictions No     Prior Function   Level of Independence Independent     Cognition   Overall Cognitive Status Within Functional Limits for tasks assessed     Observation/Other Assessments   Focus on Therapeutic Outcomes (FOTO)  46% limited      Posture/Postural Control   Posture Comments RT ilia lower withtrunk shift RT     ROM / Strength   AROM / PROM / Strength AROM;PROM;Strength     AROM   AROM Assessment Site Lumbar   Lumbar Flexion 70   Lumbar Extension 12   Lumbar - Right Side Bend 20   Lumbar - Left Side Bend 14     PROM   Overall PROM Comments LT hip rotation ext 40 Int 20     RT 60/25      Strength  Overall Strength Comments LE WNL Bilaterally            Objective measurements completed on examination: See above findings.          [facility] Adult PT Treatment/Exercise - 02/18/17 0001      Manual Therapy   Manual Therapy Joint mobilization;Soft tissue mobilization;Passive ROM;Manual Traction;Muscle Energy Technique   Muscle Energy Technique RT knee to chest with active hamstring pull                 PT Education - 02/18/17 1233    Education provided Yes   Education Details POC HEP( cued to stop if pain incr), ice PRN 10-15 min 2x/day   Person(s) Educated Patient   Methods Explanation;Demonstration;Verbal cues;Tactile cues   Comprehension Returned demonstration;Verbalized understanding          PT Short Term Goals - 02/18/17 1135      PT SHORT TERM GOAL #1   Title He will be independent with initial HEp   Time 3   Period Weeks   Status New     PT SHORT TERM GOAL #2   Title pain with activity decreased >/= 25%   Time 3   Period Weeks   Status New           PT Long Term Goals - 02/18/17 1240      PT LONG TERM GOAL #1   Title pain with activity decreased >/= 75%    Time 6   Period Weeks   Status New     PT LONG TERM GOAL #2   Title He will be independent with all HEP issued   Time 6   Period Weeks   Status New     PT LONG TERM GOAL #3   Title understand correct body mechanics with daily activities to decrease strain on back   Time 6   Status New     PT LONG TERM GOAL #4   Title He will report gradual return to gym to increase fitness and lose weight    Status New                Plan - 02/18/17 1234    Clinical Impression Statement Joseph [name].  He demo spasm with abnormal posture and tenderness oin RT SI area and lower lumbar spine laterally   History and Personal Factors relevant to plan of care: Lung CA   Clinical Presentation Unstable   Clinical Presentation due to: OA in knee and back , abnormal posture. variable pain levels and incr frequency over past 6 months   Clinical Decision Making Moderate   Rehab Potential Good   PT Frequency 2x / week   PT Duration 6 weeks   PT Treatment/Interventions Electrical Stimulation;Ultrasound;Moist Heat;Cryotherapy;Passive range of motion;Patient/family education;Manual techniques;Therapeutic exercise;Dry needling;Therapeutic activities   PT Next Visit Plan Manual and modalities for pain and ROM hip and back . Assess HEP and add as needed   PT Home Exercise Plan Knee to chest stretching annd MET for rotated ilia   Consulted and Agree with Plan of Care Patient      Patient will benefit from skilled therapeutic intervention in order to improve the following deficits and impairments:  Pain, Postural dysfunction, Decreased activity tolerance, Decreased range of motion, Increased muscle spasms  Visit Diagnosis: Chronic low back pain, unspecified back pain laterality, with sciatica presence unspecified  Abnormal posture  Muscle spasm of back  Stiffness of left hip, not elsewhere classified  Stiffness of right hip, not elsewhere classified      G-Codes - March 20, 2017 1139    Functional Assessment Tool Used (Outpatient Only) FOTO 46% limited   Functional Limitation Mobility: Walking and moving around   Mobility: Walking and Moving Around Current Status [phone]) At least 40 percent but less than 60 percent impaired, limited or restricted   Mobility: Walking and Moving Around Goal Status [phone]) At least 20 percent but  less than 40 percent impaired, limited or restricted       Problem List Patient Active Problem List   Diagnosis Date Noted  . Elevated d-dimer   . Hypoxia   . SOB (shortness of breath)   . Small bowel obstruction ([place]) 10/20/2016  . AKI (acute kidney injury) ([place]) 10/20/2016  . Acute respiratory failure with hypoxia ([place]) 10/20/2016  . Chronic pain of left knee 07/06/2016  . Port catheter in place 02/27/2016  . Claudication ([place]) 02/18/2016  . Statin intolerance 02/18/2016  . Essential hypertension 02/18/2016  . Coronary artery disease due to lipid rich plaque 08/29/2015  . Acute on chronic diastolic CHF (congestive heart failure), NYHA class 2 ([place]) 08/29/2015  . 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 Hypoxemia requiring supplemental oxygen   . Atrial fibrillation ([place]) 07/23/2014  . COPD (chronic obstructive pulmonary disease) ([place]) 07/06/2014  . Hypothyroidism 07/06/2014  . Numbness-Left Thigh 12/14/2013  . AAA- s/p Ao-Iliac [facility] March 2015 10/20/2013  . Hyperlipidemia 10/09/2013  . Hepatic cirrhosis ([place]) 12/02/2012  . Cancer of right lung [facility]) 08/06/2011    Darrel Hoover  PT 2017/03/20, 12:46 PM  [facility] [address] [place], [place], [address] Phone: [phone]   Fax:  [phone]  Name: KARSTON [place]LAND MRN: [id number] Date of Birth: May 15, 1941

## 2017-02-22 ENCOUNTER — Ambulatory Visit: Payer: PPO

## 2017-02-22 DIAGNOSIS — M25651 Stiffness of right hip, not elsewhere classified: Secondary | ICD-10-CM

## 2017-02-22 DIAGNOSIS — G8929 Other chronic pain: Secondary | ICD-10-CM

## 2017-02-22 DIAGNOSIS — M545 Low back pain: Principal | ICD-10-CM

## 2017-02-22 DIAGNOSIS — R1084 Generalized abdominal pain: Secondary | ICD-10-CM

## 2017-02-22 DIAGNOSIS — M25652 Stiffness of left hip, not elsewhere classified: Secondary | ICD-10-CM

## 2017-02-22 DIAGNOSIS — M62830 Muscle spasm of back: Secondary | ICD-10-CM

## 2017-02-22 DIAGNOSIS — R293 Abnormal posture: Secondary | ICD-10-CM

## 2017-02-22 NOTE — Therapy (Signed)
[place], [place], [address] Phone: [phone]   Fax:  [phone]  Physical Therapy Treatment  Patient Details  Name: Joseph [name] MRN: [id number] Date of Birth: 03-19-1941 Referring Provider: [name], MD  Encounter Date: 02/22/2017      PT End of Session - 02/22/17 1108    Visit Number 2   Number of Visits 12   Date for PT Re-Evaluation 04/02/17   Authorization Type M[employer]   PT Start Time 1100   PT Stop Time 1145   PT Time Calculation (min) 45 min   Activity Tolerance Patient tolerated treatment well   Behavior During Therapy [facility] for tasks assessed/performed      Past Medical History:  Diagnosis Date  . Acute on chronic diastolic CHF (congestive heart failure), NYHA class 3 ([place]) 07/23/2014  . Arthritis    left knee and back arthrtis  . Atrial fibrillation with rapid ventricular response ([place]) 07/04/2014  . CAD (coronary artery disease)   . Cataract   . Cirrhosis ([place])    By radiography, seen on CT  . Diverticulosis   . Emphysema   . Enlarged prostate   . Heart murmur    teen  . History of colon polyps 2006,2008  . History of transfusion 2015  . Hx of radiation therapy 09/02/11 to 10/19/11   chest  . Hx of radiation therapy   . Hyperlipidemia    takes Pravastatin  . Hypothyroidism    takes Synthroid daily  . Lung cancer ([place])    right upper lobe  . Personal history of adenomatous colonic polyps 07/04/2012   2006, adenomatous right colon polyp removed, then ablated in 2007 followup, 2008, adenomatous polyp of the right colon removed with hot biopsy forceps. All by Dr. Lajoyce Corners 01/03/2013 diminutive polyp ascending colon - adenoma    . Rosacea   . Shortness of breath    with exertion  . Small bowel obstruction ([place]) 07/05/2014  . Status post chemotherapy    last chemo 2013  . Umbilical hernia, incarcerated- surgery 07/10/14 07/05/2014    Past Surgical History:  Procedure  Laterality Date  . ABDOMINAL AORTAGRAM  10/20/2013   Procedure: ABDOMINAL Maxcine Ham;  Surgeon: Elam Dutch, MD;  Location: [facility] CATH LAB;  Service: Cardiovascular;;  . ABDOMINAL AORTIC ANEURYSM REPAIR N/A 10/31/2013   Procedure: aorto to left external iliac and right external iliac and left internal iliac, reimplantation of inferior mesenteric artery and ligation of left iliac artery aneursym;  Surgeon: Elam Dutch, MD;  Location: [place];  Service: Vascular;  Laterality: N/A;  . ABDOMINAL WOUND DEHISCENCE N/A 11/10/2013   Procedure: ABDOMINAL WOUND CLOSURE ;  Surgeon: Serafina Mitchell, MD;  Location: [place];  Service: Vascular;  Laterality: N/A;  . bletharoplasty    . BOWEL RESECTION N/A 07/10/2014   Procedure: SMALL BOWEL RESECTION;  Surgeon: [name], MD;  Location: [place];  Service: General;  Laterality: N/A;  . CARDIAC CATHETERIZATION  2003  . COLONOSCOPY W/ BIOPSIES     multiple   . ESOPHAGOGASTRODUODENOSCOPY  2006  . FINGER SURGERY  2008   left pointer finger  . HERNIA REPAIR  70's  . INCISIONAL HERNIA REPAIR N/A 07/10/2014   Procedure: HERNIA REPAIR INCISIONAL;  Surgeon: [name], MD;  Location: [place];  Service: General;  Laterality: N/A;  . KNEE ARTHROSCOPY  2008   left knee  . LAPAROTOMY N/A 07/10/2014   Procedure: EXPLORATORY LAPAROTOMY;  Surgeon:  [name], MD;  Location: [place];  Service: General;  Laterality: N/A;  . LOWER EXTREMITY ANGIOGRAM Bilateral 10/20/2013   Procedure: LOWER EXTREMITY ANGIOGRAM;  Surgeon: Elam Dutch, MD;  Location: [facility] CATH LAB;  Service: Cardiovascular;  Laterality: Bilateral;  . LYSIS OF ADHESION N/A 07/10/2014   Procedure: LYSIS OF ADHESION - 90 minutes;  Surgeon: [name], MD;  Location: [employer];  Service: General;  Laterality: N/A;  . PORTACATH PLACEMENT  08/10/2011   Procedure: INSERTION PORT-A-CATH;  Surgeon: Pierre Bali, MD;  Location: [place];  Service: Thoracic;  Laterality: Left;  Left Subclavian  . PTOSIS REPAIR  Bilateral 12-02-2015  . THROMBECTOMY ILIAC ARTERY Bilateral 10/31/2013   Procedure: THROMBECTOMY ILIAC ARTERY;  Surgeon: Elam Dutch, MD;  Location: [place];  Service: Vascular;  Laterality: Bilateral;  . TONSILLECTOMY     as a child  . VASECTOMY  70's  . VENTRAL HERNIA REPAIR N/A 04/04/2015   Procedure: OPEN REPAIR OF RECURRENT ABDOMINAL WALL HERNIA WITH MESH, COMPLEX LYSIS OF ADHESIONS X1 HR TAR COMPONENT SEPARATION;  Surgeon: Greer Pickerel, MD;  [facility];  Service: General;  Laterality: N/A;    There were no vitals filed for this visit.      Subjective Assessment - 02/22/17 1106    Subjective Trimming bushes cause some bilateral back pain. this wek end   Currently in Pain? Yes   Pain Score 2    Pain Location Back   Pain Orientation Right;Left   Pain Descriptors / Indicators Dull   Pain Type Chronic pain   Pain Onset More than a month ago   Pain Frequency Intermittent                         [facility] Adult PT Treatment/Exercise - 02/22/17 0001      Lumbar Exercises: Stretches   Single Knee to Chest Stretch 1 rep;30 seconds  RT/LT   Double Knee to Chest Stretch 1 rep;30 seconds   Lower Trunk Rotation 2 reps;30 seconds   Lower Trunk Rotation Limitations with knee over knee   Pelvic Tilt Limitations 10 reps 5 sec with cues to engage abdominals and glutes     Modalities   Modalities Ultrasound     Ultrasound   Ultrasound Location RT lower back    Ultrasound Parameters 100% 1Mhz 1.5 Wcm2   Ultrasound Goals Pain     Manual Therapy   Manual Therapy Joint mobilization;Soft tissue mobilization;Myofascial release   Joint Mobilization GR 3-4 PA glides to L1-5 and sacrum, PA RT hip mobsfor ER.    Myofascial Release RT gluteals with hip rotation prone.   and prone to lower lumbar  paraspinals.     Leg pulls RT x10 20-30 sec              PT Short Term Goals - 02/18/17 1135      PT SHORT TERM GOAL #1   Title He will be independent with initial HEp    Time 3   Period Weeks   Status New     PT SHORT TERM GOAL #2   Title pain with activity decreased >/= 25%   Time 3   Period Weeks   Status New           PT Long Term Goals - 02/18/17 1240      PT LONG TERM GOAL #1   Title pain with activity decreased >/= 75%    Time 6  Period Weeks   Status New     PT LONG TERM GOAL #2   Title He will be independent with all HEP issued   Time 6   Period Weeks   Status New     PT LONG TERM GOAL #3   Title understand correct body mechanics with daily activities to decrease strain on back   Time 6   Status New     PT LONG TERM GOAL #4   Title He will report gradual return to gym to increase fitness and lose weight   Status New               Plan - 02/22/17 1108    Clinical Impression Statement Continue with LBP intermittant. Tenderness on RT side into glutes RT.  Pain eased after session   PT Treatment/Interventions Electrical Stimulation;Ultrasound;Moist Heat;Cryotherapy;Passive range of motion;Patient/family education;Manual techniques;Therapeutic exercise;Dry needling;Therapeutic activities   PT Next Visit Plan Manual and modalities for pain and ROM hip and back . DN to lower back next session, assess pelvis alighnment   PT Home Exercise Plan Knee to chest stretching annd MET for rotated ilia   Consulted and Agree with Plan of Care Patient      Patient will benefit from skilled therapeutic intervention in order to improve the following deficits and impairments:  Pain, Postural dysfunction, Decreased activity tolerance, Decreased range of motion, Increased muscle spasms  Visit Diagnosis: Chronic low back pain, unspecified back pain laterality, with sciatica presence unspecified  Abnormal posture  Muscle spasm of back  Stiffness of left hip, not elsewhere classified  Stiffness of right hip, not elsewhere classified  Generalized abdominal pain     Problem List Patient Active Problem List   Diagnosis Date  Noted  . Elevated d-dimer   . Hypoxia   . SOB (shortness of breath)   . Small bowel obstruction ([place]) 10/20/2016  . AKI (acute kidney injury) ([place]) 10/20/2016  . Acute respiratory failure with hypoxia ([place]) 10/20/2016  . Chronic pain of left knee 07/06/2016  . Port catheter in place 02/27/2016  . Claudication ([place]) 02/18/2016  . Statin intolerance 02/18/2016  . Essential hypertension 02/18/2016  . Coronary artery disease due to lipid rich plaque 08/29/2015  . Acute on chronic diastolic CHF (congestive heart failure), NYHA class 2 ([place]) 08/29/2015  . 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 Hypoxemia requiring supplemental oxygen   . Atrial fibrillation ([place]) 07/23/2014  . COPD (chronic obstructive pulmonary disease) ([place]) 07/06/2014  . Hypothyroidism 07/06/2014  . Numbness-Left Thigh 12/14/2013  . AAA- s/p Ao-Iliac [facility] March 2015 10/20/2013  . Hyperlipidemia 10/09/2013  . Hepatic cirrhosis ([place]) 12/02/2012  . Cancer of right lung [facility]) 08/06/2011    Darrel Hoover  PT 02/22/2017, 1:01 PM  [facility] [address] [place], [place], [address] Phone: [phone]   Fax:  [phone]  Name: LEUL NARRAMORE MRN: [id number] Date of Birth: 1940/10/28

## 2017-02-25 ENCOUNTER — Other Ambulatory Visit: Payer: Self-pay | Admitting: Cardiology

## 2017-02-26 ENCOUNTER — Encounter: Payer: Self-pay | Admitting: Physical Therapy

## 2017-02-26 ENCOUNTER — Ambulatory Visit: Payer: PPO | Admitting: Physical Therapy

## 2017-02-26 DIAGNOSIS — M545 Low back pain: Secondary | ICD-10-CM | POA: Diagnosis not present

## 2017-02-26 DIAGNOSIS — M25651 Stiffness of right hip, not elsewhere classified: Secondary | ICD-10-CM

## 2017-02-26 DIAGNOSIS — M25652 Stiffness of left hip, not elsewhere classified: Secondary | ICD-10-CM

## 2017-02-26 DIAGNOSIS — M62830 Muscle spasm of back: Secondary | ICD-10-CM

## 2017-02-26 DIAGNOSIS — R293 Abnormal posture: Secondary | ICD-10-CM

## 2017-02-26 DIAGNOSIS — G8929 Other chronic pain: Secondary | ICD-10-CM

## 2017-02-26 DIAGNOSIS — R1084 Generalized abdominal pain: Secondary | ICD-10-CM

## 2017-02-26 NOTE — Therapy (Signed)
[place], [place], [address] Phone: [phone]   Fax:  [phone]  Physical Therapy Treatment  Patient Details  Name: Joseph [name] MRN: [id number] Date of Birth: 1940/12/22 Referring Provider: [name], MD  Encounter Date: 02/26/2017      PT End of Session - 02/26/17 1022    Visit Number 3   Number of Visits 12   Date for PT Re-Evaluation 04/02/17   Authorization Type [employer]   PT Start Time 1019   PT Stop Time 1103   PT Time Calculation (min) 44 min   Activity Tolerance Patient tolerated treatment well   Behavior During Therapy [facility] for tasks assessed/performed      Past Medical History:  Diagnosis Date  . Acute on chronic diastolic CHF (congestive heart failure), NYHA class 3 ([place]) 07/23/2014  . Arthritis    left knee and back arthrtis  . Atrial fibrillation with rapid ventricular response ([place]) 07/04/2014  . CAD (coronary artery disease)   . Cataract   . Cirrhosis ([place])    By radiography, seen on CT  . Diverticulosis   . Emphysema   . Enlarged prostate   . Heart murmur    teen  . History of colon polyps 2006,2008  . History of transfusion 2015  . Hx of radiation therapy 09/02/11 to 10/19/11   chest  . Hx of radiation therapy   . Hyperlipidemia    takes Pravastatin  . Hypothyroidism    takes Synthroid daily  . Lung cancer ([place])    right upper lobe  . Personal history of adenomatous colonic polyps 07/04/2012   2006, adenomatous right colon polyp removed, then ablated in 2007 followup, 2008, adenomatous polyp of the right colon removed with hot biopsy forceps. All by Dr. Lajoyce Corners 01/03/2013 diminutive polyp ascending colon - adenoma    . Rosacea   . Shortness of breath    with exertion  . Small bowel obstruction ([place]) 07/05/2014  . Status post chemotherapy    last chemo 2013  . Umbilical hernia, incarcerated- surgery 07/10/14 07/05/2014    Past Surgical History:  Procedure  Laterality Date  . ABDOMINAL AORTAGRAM  10/20/2013   Procedure: ABDOMINAL Maxcine Ham;  Surgeon: Elam Dutch, MD;  Location: [facility] CATH LAB;  Service: Cardiovascular;;  . ABDOMINAL AORTIC ANEURYSM REPAIR N/A 10/31/2013   Procedure: aorto to left external iliac and right external iliac and left internal iliac, reimplantation of inferior mesenteric artery and ligation of left iliac artery aneursym;  Surgeon: Elam Dutch, MD;  Location: [place];  Service: Vascular;  Laterality: N/A;  . ABDOMINAL WOUND DEHISCENCE N/A 11/10/2013   Procedure: ABDOMINAL WOUND CLOSURE ;  Surgeon: Serafina Mitchell, MD;  Location: [place];  Service: Vascular;  Laterality: N/A;  . bletharoplasty    . BOWEL RESECTION N/A 07/10/2014   Procedure: SMALL BOWEL RESECTION;  Surgeon: [name], MD;  Location: [place];  Service: General;  Laterality: N/A;  . CARDIAC CATHETERIZATION  2003  . COLONOSCOPY W/ BIOPSIES     multiple   . ESOPHAGOGASTRODUODENOSCOPY  2006  . FINGER SURGERY  2008   left pointer finger  . HERNIA REPAIR  70's  . INCISIONAL HERNIA REPAIR N/A 07/10/2014   Procedure: HERNIA REPAIR INCISIONAL;  Surgeon: [name], MD;  Location: [place];  Service: General;  Laterality: N/A;  . KNEE ARTHROSCOPY  2008   left knee  . LAPAROTOMY N/A 07/10/2014   Procedure: EXPLORATORY LAPAROTOMY;  Surgeon:  [name], MD;  Location: [place];  Service: General;  Laterality: N/A;  . LOWER EXTREMITY ANGIOGRAM Bilateral 10/20/2013   Procedure: LOWER EXTREMITY ANGIOGRAM;  Surgeon: Elam Dutch, MD;  Location: [facility] CATH LAB;  Service: Cardiovascular;  Laterality: Bilateral;  . LYSIS OF ADHESION N/A 07/10/2014   Procedure: LYSIS OF ADHESION - 90 minutes;  Surgeon: [name], MD;  Location: [place];  Service: General;  Laterality: N/A;  . PORTACATH PLACEMENT  08/10/2011   Procedure: INSERTION PORT-A-CATH;  Surgeon: Pierre Bali, MD;  Location: [place];  Service: Thoracic;  Laterality: Left;  Left Subclavian  . PTOSIS REPAIR  Bilateral 12-02-2015  . THROMBECTOMY ILIAC ARTERY Bilateral 10/31/2013   Procedure: THROMBECTOMY ILIAC ARTERY;  Surgeon: Elam Dutch, MD;  Location: [place];  Service: Vascular;  Laterality: Bilateral;  . TONSILLECTOMY     as a child  . VASECTOMY  70's  . VENTRAL HERNIA REPAIR N/A 04/04/2015   Procedure: OPEN REPAIR OF RECURRENT ABDOMINAL WALL HERNIA WITH MESH, COMPLEX LYSIS OF ADHESIONS X1 HR TAR COMPONENT SEPARATION;  Surgeon: Greer Pickerel, MD;  Location: [facility];  Service: General;  Laterality: N/A;    There were no vitals filed for this visit.      Subjective Assessment - 02/26/17 1021    Subjective " I am still feeling sore, it seems to stay about the same"   Currently in Pain? Yes   Pain Score 5    Pain Location Back   Pain Orientation Right   Pain Descriptors / Indicators Simonne Martinet PT Assessment - 02/26/17 1208      Special Tests    Special Tests Sacrolliac Tests   Sacroiliac Tests  Sacral Thrust  foward flexion (+) for posterior rot on R      Sacral thrust    Findings Positive   Side Right     Gaenslen's test   Findings Positive   Side  Right     Sacral Compression   Findings Not tested                     [facility] Adult PT Treatment/Exercise - 02/26/17 1206      Lumbar Exercises: Stretches   Active Hamstring Stretch 4 reps;30 seconds  contract/ relax with 10 sec contraction     Lumbar Exercises: Supine   Straight Leg Raise 15 reps  x 2 sets, pt fatigues quickly      Manual Therapy   Manual Therapy Soft tissue mobilization   Joint Mobilization anterior innominate rotation grade 3   performed during MET    Soft tissue mobilization IASTM over the R lumbar paraspinals   Myofascial Release fascial rolling/ stretcing of R lumbar paraspinals   Muscle Energy Technique prone resisted R hip flexion 10 x 10 sec hold          Trigger Point Dry Needling - 02/26/17 1041    Consent Given? Yes   Education Handout Provided Yes    Muscles Treated Upper Body Longissimus   Longissimus Response Twitch response elicited;Palpable increased muscle length  L5-L3 on R              PT Education - 02/26/17 1052    Education provided Yes   Education Details muscle anatomy and referral patterns. what TPDN is, benefits / what to expect and after care. anatomy of the SIJ and effects of hamstrings and hip flexors and benefits  of treatment provided.  updated HEP  before and during DN   Person(s) Educated Patient   Methods Explanation;Verbal cues;Demonstration;Handout   Comprehension Verbalized understanding;Returned demonstration;Verbal cues required          PT Short Term Goals - 02/18/17 1135      PT SHORT TERM GOAL #1   Title He will be independent with initial HEp   Time 3   Period Weeks   Status New     PT SHORT TERM GOAL #2   Title pain with activity decreased >/= 25%   Time 3   Period Weeks   Status New           PT Long Term Goals - 02/18/17 1240      PT LONG TERM GOAL #1   Title pain with activity decreased >/= 75%    Time 6   Period Weeks   Status New     PT LONG TERM GOAL #2   Title He will be independent with all HEP issued   Time 6   Period Weeks   Status New     PT LONG TERM GOAL #3   Title understand correct body mechanics with daily activities to decrease strain on back   Time 6   Status New     PT LONG TERM GOAL #4   Title He will report gradual return to gym to increase fitness and lose weight   Status New               Plan - 02/26/17 1209    Clinical Impression Statement pt continues to report reoccuring pain inthe low back that fluctuates. further assessment reveals posterior rotated innominate on the R. MET performed with resisted R hip flexion and anterior innominate mobs. DN was explained and performed on the R lumbar paraspinals followed with soft tissue work. updated HEP for innominate rotation correction exercise. pt reported some soreness from DN but  improvement in trunk mobility and reduce pain to 2/10.    PT Next Visit Plan assess response to DN,  MET with resisted R hip flexion, Hamstring stretching ( R posteriorly rotated innominate), manual over low back, core work   PT Home Exercise Plan Knee to chest stretching annd MET for rotated ilia, hamstring stretch, SLR   Consulted and Agree with Plan of Care Patient      Patient will benefit from skilled therapeutic intervention in order to improve the following deficits and impairments:  Pain, Postural dysfunction, Decreased activity tolerance, Decreased range of motion, Increased muscle spasms  Visit Diagnosis: Chronic low back pain, unspecified back pain laterality, with sciatica presence unspecified  Abnormal posture  Muscle spasm of back  Stiffness of left hip, not elsewhere classified  Stiffness of right hip, not elsewhere classified  Generalized abdominal pain     Problem List Patient Active Problem List   Diagnosis Date Noted  . Elevated d-dimer   . Hypoxia   . SOB (shortness of breath)   . Small bowel obstruction ([place]) 10/20/2016  . AKI (acute kidney injury) ([place]) 10/20/2016  . Acute respiratory failure with hypoxia ([place]) 10/20/2016  . Chronic pain of left knee 07/06/2016  . Port catheter in place 02/27/2016  . Claudication ([place]) 02/18/2016  . Statin intolerance 02/18/2016  . Essential hypertension 02/18/2016  . Coronary artery disease due to lipid rich plaque 08/29/2015  . Acute on chronic diastolic CHF (congestive heart failure), NYHA class 2 ([place]) 08/29/2015  . Recurrent ventral incisional hernia s/p open complex  repair w mesh 04/04/2015 04/04/2015  . Hypoxemia requiring supplemental oxygen   . Atrial fibrillation ([place]) 07/23/2014  . COPD (chronic obstructive pulmonary disease) ([place]) 07/06/2014  . Hypothyroidism 07/06/2014  . Numbness-Left Thigh 12/14/2013  . AAA- s/p Ao-Iliac BPG March 2015 10/20/2013  . Hyperlipidemia 10/09/2013  . Hepatic cirrhosis  ([place]) 12/02/2012  . Cancer of right lung [facility]) 08/06/2011   Starr Lake PT, DPT, LAT, ATC  02/26/17  12:16 PM      [place] [facility] [address] [place], [place], [address] Phone: [phone]   Fax:  [phone]  Name: Joseph [name] MRN: [id number] Date of Birth: April 12, 1941

## 2017-02-27 ENCOUNTER — Other Ambulatory Visit: Payer: Self-pay | Admitting: Cardiology

## 2017-03-01 ENCOUNTER — Ambulatory Visit: Payer: PPO | Attending: Internal Medicine

## 2017-03-01 DIAGNOSIS — R1084 Generalized abdominal pain: Secondary | ICD-10-CM | POA: Diagnosis not present

## 2017-03-01 DIAGNOSIS — M25651 Stiffness of right hip, not elsewhere classified: Secondary | ICD-10-CM | POA: Diagnosis not present

## 2017-03-01 DIAGNOSIS — M545 Low back pain: Secondary | ICD-10-CM | POA: Diagnosis not present

## 2017-03-01 DIAGNOSIS — R293 Abnormal posture: Secondary | ICD-10-CM | POA: Diagnosis not present

## 2017-03-01 DIAGNOSIS — G8929 Other chronic pain: Secondary | ICD-10-CM | POA: Insufficient documentation

## 2017-03-01 DIAGNOSIS — M62830 Muscle spasm of back: Secondary | ICD-10-CM | POA: Insufficient documentation

## 2017-03-01 DIAGNOSIS — M25652 Stiffness of left hip, not elsewhere classified: Secondary | ICD-10-CM | POA: Insufficient documentation

## 2017-03-01 NOTE — Therapy (Signed)
[place], [place], [address] Phone: [phone]   Fax:  [phone]  Physical Therapy Treatment  Patient Details  Name: Joseph [name] MRN: [id number] Date of Birth: 11-02-1940 Referring Provider: [name], MD  Encounter Date: 03/01/2017      PT End of Session - 03/01/17 1244    Visit Number 4   Number of Visits 12   Date for PT Re-Evaluation 04/02/17   Authorization Type [employer]   PT Start Time 2831   PT Stop Time 1225   PT Time Calculation (min) 40 min   Activity Tolerance Patient tolerated treatment well;No increased pain   Behavior During Therapy WFL for tasks assessed/performed      Past Medical History:  Diagnosis Date  . Acute on chronic diastolic CHF (congestive heart failure), NYHA class 3 ([place]) 07/23/2014  . Arthritis    left knee and back arthrtis  . Atrial fibrillation with rapid ventricular response ([place]) 07/04/2014  . CAD (coronary artery disease)   . Cataract   . Cirrhosis ([place])    By radiography, seen on CT  . Diverticulosis   . Emphysema   . Enlarged prostate   . Heart murmur    teen  . History of colon polyps 2006,2008  . History of transfusion 2015  . Hx of radiation therapy 09/02/11 to 10/19/11   chest  . Hx of radiation therapy   . Hyperlipidemia    takes Pravastatin  . Hypothyroidism    takes Synthroid daily  . Lung cancer ([place])    right upper lobe  . Personal history of adenomatous colonic polyps 07/04/2012   2006, adenomatous right colon polyp removed, then ablated in 2007 followup, 2008, adenomatous polyp of the right colon removed with hot biopsy forceps. All by Dr. Lajoyce Corners 01/03/2013 diminutive polyp ascending colon - adenoma    . Rosacea   . Shortness of breath    with exertion  . Small bowel obstruction ([place]) 07/05/2014  . Status post chemotherapy    last chemo 2013  . Umbilical hernia, incarcerated- surgery 07/10/14 07/05/2014    Past Surgical  History:  Procedure Laterality Date  . ABDOMINAL AORTAGRAM  10/20/2013   Procedure: ABDOMINAL Maxcine Ham;  Surgeon: Elam Dutch, MD;  Location: [facility] CATH LAB;  Service: Cardiovascular;;  . ABDOMINAL AORTIC ANEURYSM REPAIR N/A 10/31/2013   Procedure: aorto to left external iliac and right external iliac and left internal iliac, reimplantation of inferior mesenteric artery and ligation of left iliac artery aneursym;  Surgeon: Elam Dutch, MD;  Location: [place];  Service: Vascular;  Laterality: N/A;  . ABDOMINAL WOUND DEHISCENCE N/A 11/10/2013   Procedure: ABDOMINAL WOUND CLOSURE ;  Surgeon: Serafina Mitchell, MD;  Location: [place];  Service: Vascular;  Laterality: N/A;  . bletharoplasty    . BOWEL RESECTION N/A 07/10/2014   Procedure: SMALL BOWEL RESECTION;  Surgeon: [name], MD;  Location: [place];  Service: General;  Laterality: N/A;  . CARDIAC CATHETERIZATION  2003  . COLONOSCOPY W/ BIOPSIES     multiple   . ESOPHAGOGASTRODUODENOSCOPY  2006  . FINGER SURGERY  2008   left pointer finger  . HERNIA REPAIR  70's  . INCISIONAL HERNIA REPAIR N/A 07/10/2014   Procedure: HERNIA REPAIR INCISIONAL;  Surgeon: [name], MD;  Location: [place];  Service: General;  Laterality: N/A;  . KNEE ARTHROSCOPY  2008   left knee  . LAPAROTOMY N/A 07/10/2014   Procedure: EXPLORATORY LAPAROTOMY;  Surgeon: [name], MD;  Location: [place];  Service: General;  Laterality: N/A;  . LOWER EXTREMITY ANGIOGRAM Bilateral 10/20/2013   Procedure: LOWER EXTREMITY ANGIOGRAM;  Surgeon: Elam Dutch, MD;  Location: [facility] CATH LAB;  Service: Cardiovascular;  Laterality: Bilateral;  . LYSIS OF ADHESION N/A 07/10/2014   Procedure: LYSIS OF ADHESION - 90 minutes;  Surgeon: [name], MD;  Location: [place];  Service: General;  Laterality: N/A;  . PORTACATH PLACEMENT  08/10/2011   Procedure: INSERTION PORT-A-CATH;  Surgeon: Pierre Bali, MD;  Location: [place];  Service: Thoracic;  Laterality: Left;  Left Subclavian   . PTOSIS REPAIR Bilateral 12-02-2015  . THROMBECTOMY ILIAC ARTERY Bilateral 10/31/2013   Procedure: THROMBECTOMY ILIAC ARTERY;  Surgeon: Elam Dutch, MD;  Location: [place];  Service: Vascular;  Laterality: Bilateral;  . TONSILLECTOMY     as a child  . VASECTOMY  70's  . VENTRAL HERNIA REPAIR N/A 04/04/2015   Procedure: OPEN REPAIR OF RECURRENT ABDOMINAL WALL HERNIA WITH MESH, COMPLEX LYSIS OF ADHESIONS X1 HR TAR COMPONENT SEPARATION;  Surgeon: Greer Pickerel, MD;  [facility];  Service: General;  Laterality: N/A;    There were no vitals filed for this visit.      Subjective Assessment - 03/01/17 1243    Subjective Sore still post needling.  Will see how it oges next time and may go back to MD for possible injection.    Currently in Pain? Yes   Pain Score 4    Pain Location Back   Pain Orientation Right   Pain Descriptors / Indicators Aching;Dull   Pain Type Chronic pain   Pain Onset More than a month ago   Pain Frequency Intermittent   Aggravating Factors  sitting , AM pain   Pain Relieving Factors rest    Multiple Pain Sites No                         [facility] Adult PT Treatment/Exercise - 03/01/17 0001      Lumbar Exercises: Supine   Glut Set 15 reps   Other Supine Lumbar Exercises isometric adduction and abduction x 12 with glut set x 12     Ultrasound   Ultrasound Location RT lower back    Ultrasound Parameters 100% 1 MHz 1.5 Wcm2   Ultrasound Goals Pain     Manual Therapy   Joint Mobilization anterior innominate rotation grade 3    Soft tissue mobilization IASTM over the R lumbar paraspinals   Muscle Energy Technique sidelye resisted R hip extension 10 x 10 sec hold moving into more hip flexion in 4 sets2-3 reps    Reviewed all HEP and he was able to demo all correctly              PT Short Term Goals - 03/01/17 1249      PT SHORT TERM GOAL #1   Title He will be independent with initial HEp   Status Achieved     PT SHORT TERM GOAL  #2   Title pain with activity decreased >/= 25%   Status On-going           PT Long Term Goals - 02/18/17 1240      PT LONG TERM GOAL #1   Title pain with activity decreased >/= 75%    Time 6   Period Weeks   Status New     PT LONG TERM GOAL #2  Title He will be independent with all HEP issued   Time 6   Period Weeks   Status New     PT LONG TERM GOAL #3   Title understand correct body mechanics with daily activities to decrease strain on back   Time 6   Status New     PT LONG TERM GOAL #4   Title He will report gradual return to gym to increase fitness and lose weight   Status New               Plan - 03/01/17 1245    Clinical Impression Statement He reported feeling better post .  We discussed injection procedure ( told him to talk to MD about specifics) nd possible benefits and why none guided injection may not have helped.   Also if spasm is not primary problem DN may not improve pain. . Inonimate corrected  with MET.   I also talked about possible influence of altered gait due to degenerative changes of LT knee.   PT Treatment/Interventions Electrical Stimulation;Ultrasound;Moist Heat;Cryotherapy;Passive range of motion;Patient/family education;Manual techniques;Therapeutic exercise;Dry needling;Therapeutic activities   PT Next Visit Plan Continue manual and modalities , DN,  stab exercises   PT Home Exercise Plan Knee to chest stretching annd MET for rotated ilia, hamstring stretch, SLR   Consulted and Agree with Plan of Care Patient      Patient will benefit from skilled therapeutic intervention in order to improve the following deficits and impairments:  Pain, Postural dysfunction, Decreased activity tolerance, Decreased range of motion, Increased muscle spasms  Visit Diagnosis: Chronic low back pain, unspecified back pain laterality, with sciatica presence unspecified  Abnormal posture  Muscle spasm of back  Stiffness of left hip, not elsewhere  classified  Stiffness of right hip, not elsewhere classified  Generalized abdominal pain     Problem List Patient Active Problem List   Diagnosis Date Noted  . Elevated d-dimer   . Hypoxia   . SOB (shortness of breath)   . Small bowel obstruction ([place]) 10/20/2016  . AKI (acute kidney injury) ([place]) 10/20/2016  . Acute respiratory failure with hypoxia ([employer]) 10/20/2016  . Chronic pain of left knee 07/06/2016  . Port catheter in place 02/27/2016  . Claudication ([place]) 02/18/2016  . Statin intolerance 02/18/2016  . Essential hypertension 02/18/2016  . Coronary artery disease due to lipid rich plaque 08/29/2015  . Acute on chronic diastolic CHF (congestive heart failure), NYHA class 2 ([place]) 08/29/2015  . 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 Hypoxemia requiring supplemental oxygen   . Atrial fibrillation ([place]) 07/23/2014  . COPD (chronic obstructive pulmonary disease) ([place]) 07/06/2014  . Hypothyroidism 07/06/2014  . Numbness-Left Thigh 12/14/2013  . AAA- s/p Ao-Iliac BPG March 2015 10/20/2013  . Hyperlipidemia 10/09/2013  . Hepatic cirrhosis ([place]) 12/02/2012  . Cancer of right lung [facility]) 08/06/2011    Darrel Hoover  PT 03/01/2017, 12:52 PM  [facility] [address] [place], [place], [address] Phone: [phone]   Fax:  [phone]  Name: HOBSON LAX MRN: [id number] Date of Birth: August 23, 1941

## 2017-03-05 ENCOUNTER — Ambulatory Visit: Payer: PPO | Admitting: Physical Therapy

## 2017-03-05 ENCOUNTER — Encounter: Payer: Self-pay | Admitting: Physical Therapy

## 2017-03-05 DIAGNOSIS — M545 Low back pain: Secondary | ICD-10-CM | POA: Diagnosis not present

## 2017-03-05 DIAGNOSIS — G8929 Other chronic pain: Secondary | ICD-10-CM

## 2017-03-05 DIAGNOSIS — M62830 Muscle spasm of back: Secondary | ICD-10-CM

## 2017-03-05 DIAGNOSIS — R293 Abnormal posture: Secondary | ICD-10-CM

## 2017-03-05 DIAGNOSIS — M25652 Stiffness of left hip, not elsewhere classified: Secondary | ICD-10-CM

## 2017-03-05 DIAGNOSIS — R1084 Generalized abdominal pain: Secondary | ICD-10-CM

## 2017-03-05 DIAGNOSIS — M25651 Stiffness of right hip, not elsewhere classified: Secondary | ICD-10-CM

## 2017-03-05 NOTE — Therapy (Signed)
[place], [place], [address] Phone: [phone]   Fax:  [phone]  Physical Therapy Treatment  Patient Details  Name: Joseph [name] MRN: [id number] Date of Birth: March 16, 1941 Referring Provider: [name], MD  Encounter Date: 03/05/2017      PT End of Session - 03/05/17 0934    Visit Number 5   Number of Visits 12   Date for PT Re-Evaluation 04/02/17   PT Start Time 0931   PT Stop Time 1018   PT Time Calculation (min) 47 min   Activity Tolerance Patient tolerated treatment well   Behavior During Therapy [facility] for tasks assessed/performed      Past Medical History:  Diagnosis Date  . Acute on chronic diastolic CHF (congestive heart failure), NYHA class 3 ([place]) 07/23/2014  . Arthritis    left knee and back arthrtis  . Atrial fibrillation with rapid ventricular response ([place]) 07/04/2014  . CAD (coronary artery disease)   . Cataract   . Cirrhosis ([place])    By radiography, seen on CT  . Diverticulosis   . Emphysema   . Enlarged prostate   . Heart murmur    teen  . History of colon polyps 2006,2008  . History of transfusion 2015  . Hx of radiation therapy 09/02/11 to 10/19/11   chest  . Hx of radiation therapy   . Hyperlipidemia    takes Pravastatin  . Hypothyroidism    takes Synthroid daily  . Lung cancer ([place])    right upper lobe  . Personal history of adenomatous colonic polyps 07/04/2012   2006, adenomatous right colon polyp removed, then ablated in 2007 followup, 2008, adenomatous polyp of the right colon removed with hot biopsy forceps. All by Dr. Lajoyce Corners 01/03/2013 diminutive polyp ascending colon - adenoma    . Rosacea   . Shortness of breath    with exertion  . Small bowel obstruction ([place]) 07/05/2014  . Status post chemotherapy    last chemo 2013  . Umbilical hernia, incarcerated- surgery 07/10/14 07/05/2014    Past Surgical History:  Procedure Laterality Date  . ABDOMINAL  AORTAGRAM  10/20/2013   Procedure: ABDOMINAL Maxcine Ham;  Surgeon: Elam Dutch, MD;  Location: [facility] CATH LAB;  Service: Cardiovascular;;  . ABDOMINAL AORTIC ANEURYSM REPAIR N/A 10/31/2013   Procedure: aorto to left external iliac and right external iliac and left internal iliac, reimplantation of inferior mesenteric artery and ligation of left iliac artery aneursym;  Surgeon: Elam Dutch, MD;  Location: [place];  Service: Vascular;  Laterality: N/A;  . ABDOMINAL WOUND DEHISCENCE N/A 11/10/2013   Procedure: ABDOMINAL WOUND CLOSURE ;  Surgeon: Serafina Mitchell, MD;  Location: [place];  Service: Vascular;  Laterality: N/A;  . bletharoplasty    . BOWEL RESECTION N/A 07/10/2014   Procedure: SMALL BOWEL RESECTION;  Surgeon: [name], MD;  Location: [place];  Service: General;  Laterality: N/A;  . CARDIAC CATHETERIZATION  2003  . COLONOSCOPY W/ BIOPSIES     multiple   . ESOPHAGOGASTRODUODENOSCOPY  2006  . FINGER SURGERY  2008   left pointer finger  . HERNIA REPAIR  70's  . INCISIONAL HERNIA REPAIR N/A 07/10/2014   Procedure: HERNIA REPAIR INCISIONAL;  Surgeon: [name], MD;  Location: [place];  Service: General;  Laterality: N/A;  . KNEE ARTHROSCOPY  2008   left knee  . LAPAROTOMY N/A 07/10/2014   Procedure: EXPLORATORY LAPAROTOMY;  Surgeon: [name], MD;  [facility];  Service: General;  Laterality: N/A;  . LOWER EXTREMITY ANGIOGRAM Bilateral 10/20/2013   Procedure: LOWER EXTREMITY ANGIOGRAM;  Surgeon: Elam Dutch, MD;  Location: [facility] CATH LAB;  Service: Cardiovascular;  Laterality: Bilateral;  . LYSIS OF ADHESION N/A 07/10/2014   Procedure: LYSIS OF ADHESION - 90 minutes;  Surgeon: [name], MD;  Location: [place];  Service: General;  Laterality: N/A;  . PORTACATH PLACEMENT  08/10/2011   Procedure: INSERTION PORT-A-CATH;  Surgeon: Pierre Bali, MD;  Location: [place];  Service: Thoracic;  Laterality: Left;  Left Subclavian  . PTOSIS REPAIR Bilateral 12-02-2015  .  THROMBECTOMY ILIAC ARTERY Bilateral 10/31/2013   Procedure: THROMBECTOMY ILIAC ARTERY;  Surgeon: Elam Dutch, MD;  Location: [place];  Service: Vascular;  Laterality: Bilateral;  . TONSILLECTOMY     as a child  . VASECTOMY  70's  . VENTRAL HERNIA REPAIR N/A 04/04/2015   Procedure: OPEN REPAIR OF RECURRENT ABDOMINAL WALL HERNIA WITH MESH, COMPLEX LYSIS OF ADHESIONS X1 HR TAR COMPONENT SEPARATION;  Surgeon: Greer Pickerel, MD;  [facility];  Service: General;  Laterality: N/A;    There were no vitals filed for this visit.      Subjective Assessment - 03/05/17 0931    Subjective "I am feeling alittle sore this morning, I was carrying water this morning and it made my back sore"    Currently in Pain? Yes   Pain Score 6    Pain Location Back   Pain Orientation Right   Pain Descriptors / Indicators Aching   Pain Type Chronic pain   Pain Onset More than a month ago   Pain Frequency Intermittent                         [facility] Adult PT Treatment/Exercise - 03/05/17 0950      Ultrasound   Ultrasound Location RT lower back   Ultrasound Parameters 100@, 1 Mhz 1.5 w/cm2 x 8 min   Ultrasound Goals Pain     Manual Therapy   Joint Mobilization anterior innominate rotation grade 3 , Long axis distraction grade 5 on the LLE   Soft tissue mobilization IASTM over the R lumbar paraspinals   Myofascial Release fascial rolling/ stretcing of R lumbar paraspinals   Muscle Energy Technique prone resisted R hip extension 10 x 10 sec hold moving into more hip flexion in 4 sets2-3 reps          Trigger Point Dry Needling - 03/05/17 0936    Consent Given? Yes   Education Handout Provided Yes  given previously   Longissimus Response Twitch response elicited;Palpable increased muscle length  L1-L5 on the R                PT Short Term Goals - 03/01/17 1249      PT SHORT TERM GOAL #1   Title He will be independent with initial HEp   Status Achieved     PT SHORT TERM  GOAL #2   Title pain with activity decreased >/= 25%   Status On-going           PT Long Term Goals - 02/18/17 1240      PT LONG TERM GOAL #1   Title pain with activity decreased >/= 75%    Time 6   Period Weeks   Status New     PT LONG TERM GOAL #2   Title He will be independent with  all HEP issued   Time 6   Period Weeks   Status New     PT LONG TERM GOAL #3   Title understand correct body mechanics with daily activities to decrease strain on back   Time 6   Status New     PT LONG TERM GOAL #4   Title He will report gradual return to gym to increase fitness and lose weight   Status New               Plan - 03/05/17 1013    Clinical Impression Statement Focused todays session on manual to calm down tightness and pain in the R low back . continued TPDN over the R lumbar paraspinals followed with IASTM and myofascial techniques. continued MET with resited R quad/ hip flexor activation. post session pt reported only soreness from DN but decreased pain and tightness.    PT Next Visit Plan assess response to DN, Continue manual and modalities , DN,  stab exercises   PT Home Exercise Plan Knee to chest stretching annd MET for rotated ilia, hamstring stretch, SLR   Consulted and Agree with Plan of Care Patient      Patient will benefit from skilled therapeutic intervention in order to improve the following deficits and impairments:  Pain, Postural dysfunction, Decreased activity tolerance, Decreased range of motion, Increased muscle spasms  Visit Diagnosis: Chronic low back pain, unspecified back pain laterality, with sciatica presence unspecified  Abnormal posture  Muscle spasm of back  Stiffness of left hip, not elsewhere classified  Stiffness of right hip, not elsewhere classified  Generalized abdominal pain     Problem List Patient Active Problem List   Diagnosis Date Noted  . Elevated d-dimer   . Hypoxia   . SOB (shortness of breath)   . Small  bowel obstruction ([place]) 10/20/2016  . AKI (acute kidney injury) ([place]) 10/20/2016  . Acute respiratory failure with hypoxia ([place]) 10/20/2016  . Chronic pain of left knee 07/06/2016  . Port catheter in place 02/27/2016  . Claudication ([place]) 02/18/2016  . Statin intolerance 02/18/2016  . Essential hypertension 02/18/2016  . Coronary artery disease due to lipid rich plaque 08/29/2015  . Acute on chronic diastolic CHF (congestive heart failure), NYHA class 2 ([place]) 08/29/2015  . 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 Hypoxemia requiring supplemental oxygen   . Atrial fibrillation ([place]) 07/23/2014  . COPD (chronic obstructive pulmonary disease) ([place]) 07/06/2014  . Hypothyroidism 07/06/2014  . Numbness-Left Thigh 12/14/2013  . AAA- s/p Ao-Iliac BPG March 2015 10/20/2013  . Hyperlipidemia 10/09/2013  . Hepatic cirrhosis ([place]) 12/02/2012  . Cancer of right lung [facility]) 08/06/2011   Starr Lake PT, DPT, LAT, ATC  03/05/17  10:16 AM      [facility] [address] [place], [place], [address] Phone: [phone]   Fax:  [phone]  Name: BRYLEE MCGREAL MRN: [id number] Date of Birth: March 06, 1941

## 2017-03-09 DIAGNOSIS — H25812 Combined forms of age-related cataract, left eye: Secondary | ICD-10-CM | POA: Diagnosis not present

## 2017-03-09 DIAGNOSIS — H2512 Age-related nuclear cataract, left eye: Secondary | ICD-10-CM | POA: Diagnosis not present

## 2017-03-12 ENCOUNTER — Ambulatory Visit (HOSPITAL_BASED_OUTPATIENT_CLINIC_OR_DEPARTMENT_OTHER): Payer: PPO

## 2017-03-12 DIAGNOSIS — Z452 Encounter for adjustment and management of vascular access device: Secondary | ICD-10-CM

## 2017-03-12 DIAGNOSIS — C3411 Malignant neoplasm of upper lobe, right bronchus or lung: Secondary | ICD-10-CM | POA: Diagnosis not present

## 2017-03-12 DIAGNOSIS — Z95828 Presence of other vascular implants and grafts: Secondary | ICD-10-CM

## 2017-03-12 MED ORDER — SODIUM CHLORIDE 0.9 % IJ SOLN
10.0000 mL | INTRAMUSCULAR | Status: DC | PRN
  Administered 2017-03-12: 10 mL via INTRAVENOUS
  Filled 2017-03-12: qty 10

## 2017-03-12 MED ORDER — HEPARIN SOD (PORK) LOCK FLUSH 100 UNIT/ML IV SOLN
500.0000 [IU] | Freq: Once | INTRAVENOUS | Status: AC | PRN
  Administered 2017-03-12: 500 [IU] via INTRAVENOUS
  Filled 2017-03-12: qty 5

## 2017-03-16 ENCOUNTER — Ambulatory Visit: Payer: PPO | Admitting: Physical Therapy

## 2017-03-16 DIAGNOSIS — M25651 Stiffness of right hip, not elsewhere classified: Secondary | ICD-10-CM

## 2017-03-16 DIAGNOSIS — R293 Abnormal posture: Secondary | ICD-10-CM

## 2017-03-16 DIAGNOSIS — M62830 Muscle spasm of back: Secondary | ICD-10-CM

## 2017-03-16 DIAGNOSIS — M545 Low back pain: Secondary | ICD-10-CM | POA: Diagnosis not present

## 2017-03-16 DIAGNOSIS — M25652 Stiffness of left hip, not elsewhere classified: Secondary | ICD-10-CM

## 2017-03-16 DIAGNOSIS — G8929 Other chronic pain: Secondary | ICD-10-CM

## 2017-03-17 ENCOUNTER — Encounter: Payer: Self-pay | Admitting: Physical Therapy

## 2017-03-17 DIAGNOSIS — R972 Elevated prostate specific antigen [PSA]: Secondary | ICD-10-CM | POA: Diagnosis not present

## 2017-03-17 DIAGNOSIS — R3912 Poor urinary stream: Secondary | ICD-10-CM | POA: Diagnosis not present

## 2017-03-17 DIAGNOSIS — N401 Enlarged prostate with lower urinary tract symptoms: Secondary | ICD-10-CM | POA: Diagnosis not present

## 2017-03-17 DIAGNOSIS — N5201 Erectile dysfunction due to arterial insufficiency: Secondary | ICD-10-CM | POA: Diagnosis not present

## 2017-03-17 NOTE — Therapy (Addendum)
[place], [place], [address] Phone: [phone]   Fax:  [phone]  Physical Therapy Treatment/ Discharge   Patient Details  Name: Joseph [name] MRN: [id number] Date of Birth: 04/09/1941 Referring Provider: [name], MD  Encounter Date: 03/16/2017      PT End of Session - 03/17/17 0903    Visit Number 6   Number of Visits 12   Date for PT Re-Evaluation 04/02/17   Authorization Type MCR [employer]   PT Start Time 0930   PT Stop Time 2800   PT Time Calculation (min) 44 min   Activity Tolerance Patient tolerated treatment well   Behavior During Therapy [facility] for tasks assessed/performed      Past Medical History:  Diagnosis Date  . Acute on chronic diastolic CHF (congestive heart failure), NYHA class 3 ([place]) 07/23/2014  . Arthritis    left knee and back arthrtis  . Atrial fibrillation with rapid ventricular response ([place]) 07/04/2014  . CAD (coronary artery disease)   . Cataract   . Cirrhosis ([place])    By radiography, seen on CT  . Diverticulosis   . Emphysema   . Enlarged prostate   . Heart murmur    teen  . History of colon polyps 2006,2008  . History of transfusion 2015  . Hx of radiation therapy 09/02/11 to 10/19/11   chest  . Hx of radiation therapy   . Hyperlipidemia    takes Pravastatin  . Hypothyroidism    takes Synthroid daily  . Lung cancer ([place])    right upper lobe  . Personal history of adenomatous colonic polyps 07/04/2012   2006, adenomatous right colon polyp removed, then ablated in 2007 followup, 2008, adenomatous polyp of the right colon removed with hot biopsy forceps. All by Dr. Lajoyce Corners 01/03/2013 diminutive polyp ascending colon - adenoma    . Rosacea   . Shortness of breath    with exertion  . Small bowel obstruction ([place]) 07/05/2014  . Status post chemotherapy    last chemo 2013  . Umbilical hernia, incarcerated- surgery 07/10/14 07/05/2014    Past Surgical History:   Procedure Laterality Date  . ABDOMINAL AORTAGRAM  10/20/2013   Procedure: ABDOMINAL Maxcine Ham;  Surgeon: Elam Dutch, MD;  Location: [facility] CATH LAB;  Service: Cardiovascular;;  . ABDOMINAL AORTIC ANEURYSM REPAIR N/A 10/31/2013   Procedure: aorto to left external iliac and right external iliac and left internal iliac, reimplantation of inferior mesenteric artery and ligation of left iliac artery aneursym;  Surgeon: Elam Dutch, MD;  Location: [place];  Service: Vascular;  Laterality: N/A;  . ABDOMINAL WOUND DEHISCENCE N/A 11/10/2013   Procedure: ABDOMINAL WOUND CLOSURE ;  Surgeon: Serafina Mitchell, MD;  Location: [place];  Service: Vascular;  Laterality: N/A;  . bletharoplasty    . BOWEL RESECTION N/A 07/10/2014   Procedure: SMALL BOWEL RESECTION;  Surgeon: [name], MD;  Location: [place];  Service: General;  Laterality: N/A;  . CARDIAC CATHETERIZATION  2003  . COLONOSCOPY W/ BIOPSIES     multiple   . ESOPHAGOGASTRODUODENOSCOPY  2006  . FINGER SURGERY  2008   left pointer finger  . HERNIA REPAIR  70's  . INCISIONAL HERNIA REPAIR N/A 07/10/2014   Procedure: HERNIA REPAIR INCISIONAL;  Surgeon: [name], MD;  Location: [place];  Service: General;  Laterality: N/A;  . KNEE ARTHROSCOPY  2008   left knee  . LAPAROTOMY N/A 07/10/2014   Procedure: EXPLORATORY LAPAROTOMY;  Surgeon: [name], MD;  Location: [place];  Service: General;  Laterality: N/A;  . LOWER EXTREMITY ANGIOGRAM Bilateral 10/20/2013   Procedure: LOWER EXTREMITY ANGIOGRAM;  Surgeon: Elam Dutch, MD;  Location: [facility] CATH LAB;  Service: Cardiovascular;  Laterality: Bilateral;  . LYSIS OF ADHESION N/A 07/10/2014   Procedure: LYSIS OF ADHESION - 90 minutes;  Surgeon: [name], MD;  Location: [place];  Service: General;  Laterality: N/A;  . PORTACATH PLACEMENT  08/10/2011   Procedure: INSERTION PORT-A-CATH;  Surgeon: Pierre Bali, MD;  Location: [place];  Service: Thoracic;  Laterality: Left;  Left Subclavian  . PTOSIS  REPAIR Bilateral 12-02-2015  . THROMBECTOMY ILIAC ARTERY Bilateral 10/31/2013   Procedure: THROMBECTOMY ILIAC ARTERY;  Surgeon: Elam Dutch, MD;  Location: [place];  Service: Vascular;  Laterality: Bilateral;  . TONSILLECTOMY     as a child  . VASECTOMY  70's  . VENTRAL HERNIA REPAIR N/A 04/04/2015   Procedure: OPEN REPAIR OF RECURRENT ABDOMINAL WALL HERNIA WITH MESH, COMPLEX LYSIS OF ADHESIONS X1 HR TAR COMPONENT SEPARATION;  Surgeon: Greer Pickerel, MD;  [facility];  Service: General;  Laterality: N/A;    There were no vitals filed for this visit.      Subjective Assessment - 03/17/17 0757    Subjective Patient reports no significant difference. He still has pain with activity. He continues to have pain on the right side.    Pertinent History Lung CA   How long can you sit comfortably? As needed   Diagnostic tests XRay : negative   Patient Stated Goals Decrease LBP   Currently in Pain? Yes   Pain Score 5    Pain Location Back   Pain Orientation Right   Pain Descriptors / Indicators Aching   Pain Type Chronic pain   Pain Onset More than a month ago   Pain Frequency Intermittent   Aggravating Factors  sitting, AM pain    Pain Relieving Factors rest    Multiple Pain Sites No                         [facility] Adult PT Treatment/Exercise - 03/17/17 0001      Lumbar Exercises: Prone   Other Prone Lumbar Exercises lateral prayer stretch 3x20 sec hold      Ultrasound   Ultrasound Location Right lower back    Ultrasound Parameters 100% 1 mhz    Ultrasound Goals Pain     Manual Therapy   Manual Therapy Soft tissue mobilization   Soft tissue mobilization IASTM over the R lumbar paraspinals   Myofascial Release fascial rolling/ stretcing of R lumbar paraspinals   Muscle Energy Technique prone resisted R hip extension 10 x 10 sec hold moving into more hip flexion in 4 sets2-3 reps          Trigger Point Dry Needling - 03/17/17 0924    Consent Given? Yes    Education Handout Provided Yes   Muscles Treated Upper Body --  Quadratus 2 spots good twitch               PT Education - 03/17/17 0758    Education provided Yes   Education Details benefits of TPDN and referal patterns. improtance of increasing core stability    Person(s) Educated Patient   Methods Explanation;Demonstration;Tactile cues;Handout   Comprehension Verbalized understanding;Returned demonstration;Verbal cues required          PT Short Term Goals -  03/17/17 0911      PT SHORT TERM GOAL #1   Title He will be independent with initial HEp   Time 3   Period Weeks   Status Achieved     PT SHORT TERM GOAL #2   Title pain with activity decreased >/= 25%   Baseline no real change    Time 3   Period Weeks   Status On-going           PT Long Term Goals - 03/17/17 0911      PT LONG TERM GOAL #1   Title pain with activity decreased >/= 75%    Baseline no real change    Time 6   Period Weeks   Status On-going     PT LONG TERM GOAL #2   Title He will be independent with all HEP issued   Time 6   Period Weeks   Status On-going     PT LONG TERM GOAL #3   Title understand correct body mechanics with daily activities to decrease strain on back   Time 6   Period Weeks   Status On-going     PT LONG TERM GOAL #4   Title He will report gradual return to gym to increase fitness and lose weight   Time 6   Period Weeks   Status On-going               Plan - 03/17/17 0904    Clinical Impression Statement Patient continues to have spasming of his lumbar paraspinals and quaratus; Trigger point dry needling focused on the quadratus today. Therapy also gave him a prayer stretch. Therapy will review exercises with anticipated discharge next visit. FOTO assessed no    Clinical Presentation Unstable   Clinical Decision Making Moderate   Rehab Potential Good   PT Frequency 2x / week   PT Duration 6 weeks   PT Treatment/Interventions Electrical  Stimulation;Ultrasound;Moist Heat;Cryotherapy;Passive range of motion;Patient/family education;Manual techniques;Therapeutic exercise;Dry needling;Therapeutic activities   PT Next Visit Plan assess response to DN, Continue manual and modalities , DN,  review stabilization exercises for anticipated discharge   PT Home Exercise Plan Knee to chest stretching annd MET for rotated ilia, hamstring stretch, SLR   Consulted and Agree with Plan of Care Patient      Patient will benefit from skilled therapeutic intervention in order to improve the following deficits and impairments:  Pain, Postural dysfunction, Decreased activity tolerance, Decreased range of motion, Increased muscle spasms  Visit Diagnosis: Chronic low back pain, unspecified back pain laterality, with sciatica presence unspecified  Abnormal posture  Muscle spasm of back  Stiffness of left hip, not elsewhere classified  Stiffness of right hip, not elsewhere classified  PHYSICAL THERAPY DISCHARGE SUMMARY  Visits from Start of Care: 6  Current functional level related to goals / functional outcomes: Improved pain with ADL's    Remaining deficits: Spasming at times     Education / Equipment: HEP  Plan: Patient agrees to discharge.  Patient goals were not met. Patient is being discharged due to being pleased with the current functional level.  ?????       Problem List Patient Active Problem List   Diagnosis Date Noted  . Elevated d-dimer   . Hypoxia   . SOB (shortness of breath)   . Small bowel obstruction ([place]) 10/20/2016  . AKI (acute kidney injury) ([place]) 10/20/2016  . Acute respiratory failure with hypoxia ([place]) 10/20/2016  . Chronic pain of left knee 07/06/2016  .  Port catheter in place 02/27/2016  . Claudication ([place]) 02/18/2016  . Statin intolerance 02/18/2016  . Essential hypertension 02/18/2016  . Coronary artery disease due to lipid rich plaque 08/29/2015  . Acute on chronic diastolic CHF (congestive  heart failure), NYHA class 2 ([place]) 08/29/2015  . 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 Hypoxemia requiring supplemental oxygen   . Atrial fibrillation ([place]) 07/23/2014  . COPD (chronic obstructive pulmonary disease) ([place]) 07/06/2014  . Hypothyroidism 07/06/2014  . Numbness-Left Thigh 12/14/2013  . AAA- s/p Ao-Iliac BPG March 2015 10/20/2013  . Hyperlipidemia 10/09/2013  . Hepatic cirrhosis ([place]) 12/02/2012  . Cancer of right lung ([place]) 08/06/2011    Carney Living  PT DPT  03/17/2017, 9:25 AM  [facility] [address] [place], [place], [address] Phone: [phone]   Fax:  [phone]  Name: Joseph [name] MRN: [id number] Date of Birth: 01-Dec-1940

## 2017-03-18 ENCOUNTER — Inpatient Hospital Stay (HOSPITAL_COMMUNITY): Payer: PPO

## 2017-03-18 ENCOUNTER — Inpatient Hospital Stay (HOSPITAL_COMMUNITY)
Admission: EM | Admit: 2017-03-18 | Discharge: 2017-03-21 | DRG: 388 | Disposition: A | Discharge status: Home / Self Care | Payer: PPO | Attending: Internal Medicine | Admitting: Internal Medicine

## 2017-03-18 ENCOUNTER — Encounter (HOSPITAL_COMMUNITY): Payer: Self-pay | Admitting: Emergency Medicine

## 2017-03-18 ENCOUNTER — Ambulatory Visit: Payer: PPO | Admitting: Physical Therapy

## 2017-03-18 ENCOUNTER — Emergency Department (HOSPITAL_COMMUNITY): Payer: PPO

## 2017-03-18 DIAGNOSIS — J9811 Atelectasis: Secondary | ICD-10-CM | POA: Diagnosis present

## 2017-03-18 DIAGNOSIS — Z79899 Other long term (current) drug therapy: Secondary | ICD-10-CM

## 2017-03-18 DIAGNOSIS — J449 Chronic obstructive pulmonary disease, unspecified: Secondary | ICD-10-CM | POA: Diagnosis not present

## 2017-03-18 DIAGNOSIS — J9601 Acute respiratory failure with hypoxia: Secondary | ICD-10-CM | POA: Diagnosis not present

## 2017-03-18 DIAGNOSIS — I2510 Atherosclerotic heart disease of native coronary artery without angina pectoris: Secondary | ICD-10-CM | POA: Diagnosis present

## 2017-03-18 DIAGNOSIS — I739 Peripheral vascular disease, unspecified: Secondary | ICD-10-CM | POA: Diagnosis present

## 2017-03-18 DIAGNOSIS — E860 Dehydration: Secondary | ICD-10-CM | POA: Diagnosis present

## 2017-03-18 DIAGNOSIS — Z9221 Personal history of antineoplastic chemotherapy: Secondary | ICD-10-CM

## 2017-03-18 DIAGNOSIS — J984 Other disorders of lung: Secondary | ICD-10-CM | POA: Diagnosis not present

## 2017-03-18 DIAGNOSIS — E785 Hyperlipidemia, unspecified: Secondary | ICD-10-CM | POA: Diagnosis present

## 2017-03-18 DIAGNOSIS — Z923 Personal history of irradiation: Secondary | ICD-10-CM

## 2017-03-18 DIAGNOSIS — J9690 Respiratory failure, unspecified, unspecified whether with hypoxia or hypercapnia: Secondary | ICD-10-CM | POA: Diagnosis not present

## 2017-03-18 DIAGNOSIS — K7460 Unspecified cirrhosis of liver: Secondary | ICD-10-CM | POA: Diagnosis not present

## 2017-03-18 DIAGNOSIS — M479 Spondylosis, unspecified: Secondary | ICD-10-CM | POA: Diagnosis present

## 2017-03-18 DIAGNOSIS — I10 Essential (primary) hypertension: Secondary | ICD-10-CM | POA: Diagnosis present

## 2017-03-18 DIAGNOSIS — K56600 Partial intestinal obstruction, unspecified as to cause: Principal | ICD-10-CM | POA: Diagnosis present

## 2017-03-18 DIAGNOSIS — E039 Hypothyroidism, unspecified: Secondary | ICD-10-CM | POA: Diagnosis not present

## 2017-03-18 DIAGNOSIS — K660 Peritoneal adhesions (postprocedural) (postinfection): Secondary | ICD-10-CM | POA: Diagnosis not present

## 2017-03-18 DIAGNOSIS — Z86010 Personal history of colonic polyps: Secondary | ICD-10-CM | POA: Diagnosis not present

## 2017-03-18 DIAGNOSIS — C3491 Malignant neoplasm of unspecified part of right bronchus or lung: Secondary | ICD-10-CM | POA: Diagnosis present

## 2017-03-18 DIAGNOSIS — Z0189 Encounter for other specified special examinations: Secondary | ICD-10-CM

## 2017-03-18 DIAGNOSIS — N400 Enlarged prostate without lower urinary tract symptoms: Secondary | ICD-10-CM | POA: Diagnosis not present

## 2017-03-18 DIAGNOSIS — I4891 Unspecified atrial fibrillation: Secondary | ICD-10-CM | POA: Diagnosis present

## 2017-03-18 DIAGNOSIS — I714 Abdominal aortic aneurysm, without rupture, unspecified: Secondary | ICD-10-CM | POA: Diagnosis present

## 2017-03-18 DIAGNOSIS — R109 Unspecified abdominal pain: Secondary | ICD-10-CM | POA: Diagnosis not present

## 2017-03-18 DIAGNOSIS — K56609 Unspecified intestinal obstruction, unspecified as to partial versus complete obstruction: Secondary | ICD-10-CM | POA: Diagnosis not present

## 2017-03-18 DIAGNOSIS — Z85118 Personal history of other malignant neoplasm of bronchus and lung: Secondary | ICD-10-CM

## 2017-03-18 DIAGNOSIS — Z87891 Personal history of nicotine dependence: Secondary | ICD-10-CM

## 2017-03-18 DIAGNOSIS — I110 Hypertensive heart disease with heart failure: Secondary | ICD-10-CM | POA: Diagnosis not present

## 2017-03-18 DIAGNOSIS — Z95828 Presence of other vascular implants and grafts: Secondary | ICD-10-CM

## 2017-03-18 DIAGNOSIS — Z4682 Encounter for fitting and adjustment of non-vascular catheter: Secondary | ICD-10-CM | POA: Diagnosis not present

## 2017-03-18 DIAGNOSIS — R000 Tachycardia, unspecified: Secondary | ICD-10-CM | POA: Diagnosis present

## 2017-03-18 DIAGNOSIS — K56699 Other intestinal obstruction unspecified as to partial versus complete obstruction: Secondary | ICD-10-CM | POA: Diagnosis not present

## 2017-03-18 DIAGNOSIS — M1712 Unilateral primary osteoarthritis, left knee: Secondary | ICD-10-CM | POA: Diagnosis present

## 2017-03-18 DIAGNOSIS — Z7982 Long term (current) use of aspirin: Secondary | ICD-10-CM

## 2017-03-18 DIAGNOSIS — I5032 Chronic diastolic (congestive) heart failure: Secondary | ICD-10-CM | POA: Diagnosis present

## 2017-03-18 DIAGNOSIS — K5730 Diverticulosis of large intestine without perforation or abscess without bleeding: Secondary | ICD-10-CM | POA: Diagnosis present

## 2017-03-18 DIAGNOSIS — I480 Paroxysmal atrial fibrillation: Secondary | ICD-10-CM | POA: Diagnosis not present

## 2017-03-18 DIAGNOSIS — M549 Dorsalgia, unspecified: Secondary | ICD-10-CM | POA: Diagnosis present

## 2017-03-18 DIAGNOSIS — Z9049 Acquired absence of other specified parts of digestive tract: Secondary | ICD-10-CM

## 2017-03-18 LAB — CBC
HEMATOCRIT: 46.5 % (ref 39.0–52.0)
HEMOGLOBIN: 17.1 g/dL — AB (ref 13.0–17.0)
MCH: 34.5 pg — AB (ref 26.0–34.0)
MCHC: 36.8 g/dL — AB (ref 30.0–36.0)
MCV: 93.9 fL (ref 78.0–100.0)
Platelets: 128 10*3/uL — ABNORMAL LOW (ref 150–400)
RBC: 4.95 MIL/uL (ref 4.22–5.81)
RDW: 14.2 % (ref 11.5–15.5)
WBC: 9.2 10*3/uL (ref 4.0–10.5)

## 2017-03-18 LAB — I-STAT CHEM 8, ED
BUN: 20 mg/dL (ref 6–20)
CREATININE: 0.9 mg/dL (ref 0.61–1.24)
Calcium, Ion: 1.11 mmol/L — ABNORMAL LOW (ref 1.15–1.40)
Chloride: 104 mmol/L (ref 101–111)
GLUCOSE: 134 mg/dL — AB (ref 65–99)
HCT: 50 % (ref 39.0–52.0)
HEMOGLOBIN: 17 g/dL (ref 13.0–17.0)
POTASSIUM: 3.8 mmol/L (ref 3.5–5.1)
Sodium: 140 mmol/L (ref 135–145)
TCO2: 24 mmol/L (ref 0–100)

## 2017-03-18 LAB — URINALYSIS, ROUTINE W REFLEX MICROSCOPIC
BILIRUBIN URINE: NEGATIVE
GLUCOSE, UA: NEGATIVE mg/dL
Hgb urine dipstick: NEGATIVE
KETONES UR: NEGATIVE mg/dL
Leukocytes, UA: NEGATIVE
Nitrite: NEGATIVE
PH: 5 (ref 5.0–8.0)
Protein, ur: NEGATIVE mg/dL

## 2017-03-18 LAB — COMPREHENSIVE METABOLIC PANEL
ALBUMIN: 3.9 g/dL (ref 3.5–5.0)
ALT: 31 U/L (ref 17–63)
AST: 41 U/L (ref 15–41)
Alkaline Phosphatase: 140 U/L — ABNORMAL HIGH (ref 38–126)
Anion gap: 10 (ref 5–15)
BILIRUBIN TOTAL: 1.4 mg/dL — AB (ref 0.3–1.2)
BUN: 20 mg/dL (ref 6–20)
CHLORIDE: 104 mmol/L (ref 101–111)
CO2: 24 mmol/L (ref 22–32)
Calcium: 9.5 mg/dL (ref 8.9–10.3)
Creatinine, Ser: 1.01 mg/dL (ref 0.61–1.24)
GFR calc Af Amer: >60 mL/min (ref >60)
GFR calc non Af Amer: >60 mL/min (ref >60)
GLUCOSE: 134 mg/dL — AB (ref 65–99)
POTASSIUM: 3.9 mmol/L (ref 3.5–5.1)
SODIUM: 138 mmol/L (ref 135–145)
Total Protein: 7.9 g/dL (ref 6.5–8.1)

## 2017-03-18 LAB — GLUCOSE, CAPILLARY
Glucose-Capillary: 112 mg/dL — ABNORMAL HIGH (ref 65–99)
Glucose-Capillary: 128 mg/dL — ABNORMAL HIGH (ref 65–99)

## 2017-03-18 LAB — LIPASE, BLOOD: LIPASE: 29 U/L (ref 11–51)

## 2017-03-18 MED ORDER — MORPHINE SULFATE (PF) 4 MG/ML IV SOLN
4.0000 mg | Freq: Once | INTRAVENOUS | Status: AC
  Administered 2017-03-18: 4 mg via INTRAVENOUS

## 2017-03-18 MED ORDER — OFLOXACIN 0.3 % OP SOLN
1.0000 [drp] | Freq: Four times a day (QID) | OPHTHALMIC | Status: DC
  Administered 2017-03-18 – 2017-03-21 (×13): 1 [drp] via OPHTHALMIC
  Filled 2017-03-18: qty 5

## 2017-03-18 MED ORDER — ONDANSETRON HCL 4 MG PO TABS: 4.0000 mg | ORAL_TABLET | Freq: Four times a day (QID) | ORAL | Status: DC | PRN

## 2017-03-18 MED ORDER — KETOROLAC TROMETHAMINE 0.5 % OP SOLN
1.0000 [drp] | Freq: Four times a day (QID) | OPHTHALMIC | Status: DC
  Administered 2017-03-18 – 2017-03-21 (×13): 1 [drp] via OPHTHALMIC
  Filled 2017-03-18: qty 5

## 2017-03-18 MED ORDER — ALTEPLASE 2 MG IJ SOLR
2.0000 mg | Freq: Once | INTRAMUSCULAR | Status: AC
  Administered 2017-03-18: 2 mg
  Filled 2017-03-18 (×2): qty 2

## 2017-03-18 MED ORDER — ONDANSETRON HCL 4 MG/2ML IJ SOLN
INTRAMUSCULAR | Status: AC
  Filled 2017-03-18: qty 2

## 2017-03-18 MED ORDER — DIATRIZOATE MEGLUMINE & SODIUM 66-10 % PO SOLN
90.0000 mL | Freq: Once | ORAL | Status: DC
  Filled 2017-03-18: qty 90

## 2017-03-18 MED ORDER — IOPAMIDOL (ISOVUE-300) INJECTION 61%
INTRAVENOUS | Status: AC
  Administered 2017-03-18: 100 mL via INTRAVENOUS
  Filled 2017-03-18: qty 100

## 2017-03-18 MED ORDER — ONDANSETRON HCL 4 MG/2ML IJ SOLN
4.0000 mg | Freq: Once | INTRAMUSCULAR | Status: AC
  Administered 2017-03-18: 4 mg via INTRAVENOUS
  Filled 2017-03-18: qty 2

## 2017-03-18 MED ORDER — ACETAMINOPHEN 325 MG PO TABS: 650.0000 mg | ORAL_TABLET | Freq: Four times a day (QID) | ORAL | Status: DC | PRN

## 2017-03-18 MED ORDER — METOPROLOL TARTRATE 5 MG/5ML IV SOLN
5.0000 mg | Freq: Four times a day (QID) | INTRAVENOUS | Status: DC
  Administered 2017-03-18 – 2017-03-21 (×12): 5 mg via INTRAVENOUS
  Filled 2017-03-18 (×12): qty 5

## 2017-03-18 MED ORDER — HYDRALAZINE HCL 20 MG/ML IJ SOLN
10.0000 mg | INTRAMUSCULAR | Status: DC | PRN
  Filled 2017-03-18: qty 0.5

## 2017-03-18 MED ORDER — ONDANSETRON HCL 4 MG/2ML IJ SOLN
4.0000 mg | Freq: Once | INTRAMUSCULAR | Status: AC
Start: 2017-03-18 — End: 2017-03-18
  Administered 2017-03-18: 4 mg via INTRAVENOUS

## 2017-03-18 MED ORDER — ONDANSETRON HCL 4 MG/2ML IJ SOLN: 4.0000 mg | Freq: Four times a day (QID) | INTRAMUSCULAR | Status: DC | PRN

## 2017-03-18 MED ORDER — ONDANSETRON HCL 4 MG/2ML IJ SOLN
4.0000 mg | Freq: Once | INTRAMUSCULAR | Status: AC
Start: 2017-03-18 — End: 2017-03-18
  Administered 2017-03-18: 4 mg via INTRAVENOUS
  Filled 2017-03-18: qty 2

## 2017-03-18 MED ORDER — DEXTROSE-NACL 5-0.45 % IV SOLN
INTRAVENOUS | Status: DC
  Administered 2017-03-19: 05:00:00 via INTRAVENOUS

## 2017-03-18 MED ORDER — DIATRIZOATE MEGLUMINE & SODIUM 66-10 % PO SOLN
90.0000 mL | Freq: Once | ORAL | Status: AC
  Administered 2017-03-18: 90 mL via NASOGASTRIC
  Filled 2017-03-18: qty 90

## 2017-03-18 MED ORDER — MORPHINE SULFATE (PF) 4 MG/ML IV SOLN
INTRAVENOUS | Status: AC
  Filled 2017-03-18: qty 1

## 2017-03-18 MED ORDER — SODIUM CHLORIDE 0.9% FLUSH: 10.0000 mL | Freq: Two times a day (BID) | INTRAVENOUS | Status: DC

## 2017-03-18 MED ORDER — SODIUM CHLORIDE 0.9% FLUSH
10.0000 mL | INTRAVENOUS | Status: DC | PRN
  Administered 2017-03-21: 10 mL
  Filled 2017-03-18: qty 40

## 2017-03-18 MED ORDER — DEXTROSE-NACL 5-0.45 % IV SOLN
INTRAVENOUS | Status: DC
  Administered 2017-03-18: 05:00:00 via INTRAVENOUS

## 2017-03-18 MED ORDER — SODIUM CHLORIDE 0.9 % IV BOLUS (SEPSIS)
1000.0000 mL | Freq: Once | INTRAVENOUS | Status: AC
  Administered 2017-03-18: 1000 mL via INTRAVENOUS

## 2017-03-18 MED ORDER — TIOTROPIUM BROMIDE MONOHYDRATE 18 MCG IN CAPS
18.0000 ug | ORAL_CAPSULE | Freq: Every day | RESPIRATORY_TRACT | Status: DC
  Administered 2017-03-18 – 2017-03-21 (×4): 18 ug via RESPIRATORY_TRACT
  Filled 2017-03-18: qty 5

## 2017-03-18 MED ORDER — LEVOTHYROXINE SODIUM 100 MCG IV SOLR
87.5000 ug | Freq: Every day | INTRAVENOUS | Status: DC
  Administered 2017-03-18 – 2017-03-21 (×4): 87.5 ug via INTRAVENOUS
  Filled 2017-03-18 (×4): qty 5

## 2017-03-18 MED ORDER — MORPHINE SULFATE (PF) 4 MG/ML IV SOLN: 2.0000 mg | INTRAVENOUS | Status: DC | PRN

## 2017-03-18 MED ORDER — MORPHINE SULFATE (PF) 4 MG/ML IV SOLN
4.0000 mg | Freq: Once | INTRAVENOUS | Status: AC
  Administered 2017-03-18: 4 mg via INTRAVENOUS
  Filled 2017-03-18: qty 1

## 2017-03-18 MED ORDER — PREDNISOLONE ACETATE 1 % OP SUSP
1.0000 [drp] | Freq: Two times a day (BID) | OPHTHALMIC | Status: DC
  Administered 2017-03-18 – 2017-03-21 (×7): 1 [drp] via OPHTHALMIC
  Filled 2017-03-18: qty 5

## 2017-03-18 MED ORDER — IOPAMIDOL (ISOVUE-300) INJECTION 61%
100.0000 mL | Freq: Once | INTRAVENOUS | Status: AC | PRN
  Administered 2017-03-18: 100 mL via INTRAVENOUS

## 2017-03-18 MED ORDER — ACETAMINOPHEN 650 MG RE SUPP: 650.0000 mg | Freq: Four times a day (QID) | RECTAL | Status: DC | PRN

## 2017-03-18 NOTE — ED Provider Notes (Signed)
[facility] DEPT Provider Note   CSN: [id number] Arrival date & time: 03/18/17  0202  By signing my name below, I, Marcello Moores, attest that this documentation has been prepared under the direction and in the presence of Deno Etienne, DO. Electronically Signed: Marcello Moores, ED Scribe. 03/18/17. 2:42 AM.  History   Chief Complaint Chief Complaint  Patient presents with  . Abdominal Pain   The history is provided by the patient. No language interpreter was used.  Illness  This is a new problem. The current episode started 1 to 2 hours ago. The problem occurs constantly. The problem has not changed since onset.Associated symptoms include abdominal pain. Pertinent negatives include no chest pain, no headaches and no shortness of breath. Nothing aggravates the symptoms. Nothing relieves the symptoms. He has tried nothing for the symptoms. The treatment provided no relief.   HPI Comments: Joseph [name] is a 76 y.o. male who presents to the Emergency Department complaining of gradually worsening, persistent lower abdominal pain with associated nausea and back pain. The pt believes that his current symptoms feel similar to a bowel obstruction, as he has had 2 in the past. He reports normal BM prior to tonight, where he passed a small stool. The pt has a PMHx of arthritis, CAD, diverticulosis, and colon polyps. He denies bowel and bladder incontinence.    Past Medical History:  Diagnosis Date  . Acute on chronic diastolic CHF (congestive heart failure), NYHA class 3 ([place]) 07/23/2014  . Arthritis    left knee and back arthrtis  . Atrial fibrillation with rapid ventricular response ([place]) 07/04/2014  . CAD (coronary artery disease)   . Cataract   . Cirrhosis ([place])    By radiography, seen on CT  . Diverticulosis   . Emphysema   . Enlarged prostate   . Heart murmur    teen  . History of colon polyps 2006,2008  . History of transfusion 2015  . Hx of radiation therapy 09/02/11 to  10/19/11   chest  . Hx of radiation therapy   . Hyperlipidemia    takes Pravastatin  . Hypothyroidism    takes Synthroid daily  . Lung cancer ([place])    right upper lobe  . Personal history of adenomatous colonic polyps 07/04/2012   2006, adenomatous right colon polyp removed, then ablated in 2007 followup, 2008, adenomatous polyp of the right colon removed with hot biopsy forceps. All by Dr. Lajoyce Corners 01/03/2013 diminutive polyp ascending colon - adenoma    . Rosacea   . Shortness of breath    with exertion  . Small bowel obstruction ([place]) 07/05/2014  . Status post chemotherapy    last chemo 2013  . Umbilical hernia, incarcerated- surgery 07/10/14 07/05/2014    Patient Active Problem List   Diagnosis Date Noted  . SBO (small bowel obstruction) ([place]) 03/18/2017  . Elevated d-dimer   . Hypoxia   . SOB (shortness of breath)   . Small bowel obstruction ([place]) 10/20/2016  . AKI (acute kidney injury) ([place]) 10/20/2016  . Acute respiratory failure with hypoxia ([place]) 10/20/2016  . Chronic pain of left knee 07/06/2016  . Port catheter in place 02/27/2016  . Claudication ([place]) 02/18/2016  . Statin intolerance 02/18/2016  . Essential hypertension 02/18/2016  . Coronary artery disease due to lipid rich plaque 08/29/2015  . Acute on chronic diastolic CHF (congestive heart failure), NYHA class 2 ([place]) 08/29/2015  . 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 Hypoxemia  requiring supplemental oxygen   . Atrial fibrillation ([place]) 07/23/2014  . COPD (chronic obstructive pulmonary disease) ([place]) 07/06/2014  . Hypothyroidism 07/06/2014  . Numbness-Left Thigh 12/14/2013  . AAA- s/p Ao-Iliac BPG March 2015 10/20/2013  . Hyperlipidemia 10/09/2013  . Hepatic cirrhosis ([place]) 12/02/2012  . Cancer of right lung ([place]) 08/06/2011    Past Surgical History:  Procedure Laterality Date  . ABDOMINAL AORTAGRAM  10/20/2013   Procedure: ABDOMINAL Maxcine Ham;  Surgeon:  Elam Dutch, MD;  Location: [facility] CATH LAB;  Service: Cardiovascular;;  . ABDOMINAL AORTIC ANEURYSM REPAIR N/A 10/31/2013   Procedure: aorto to left external iliac and right external iliac and left internal iliac, reimplantation of inferior mesenteric artery and ligation of left iliac artery aneursym;  Surgeon: Elam Dutch, MD;  Location: [facility];  Service: Vascular;  Laterality: N/A;  . ABDOMINAL WOUND DEHISCENCE N/A 11/10/2013   Procedure: ABDOMINAL WOUND CLOSURE ;  Surgeon: Serafina Mitchell, MD;  Location: [facility];  Service: Vascular;  Laterality: N/A;  . bletharoplasty    . BOWEL RESECTION N/A 07/10/2014   Procedure: SMALL BOWEL RESECTION;  Surgeon: [name], MD;  Location: [facility];  Service: General;  Laterality: N/A;  . CARDIAC CATHETERIZATION  2003  . COLONOSCOPY W/ BIOPSIES     multiple   . ESOPHAGOGASTRODUODENOSCOPY  2006  . FINGER SURGERY  2008   left pointer finger  . HERNIA REPAIR  70's  . INCISIONAL HERNIA REPAIR N/A 07/10/2014   Procedure: HERNIA REPAIR INCISIONAL;  Surgeon: [name], MD;  Location: [facility];  Service: General;  Laterality: N/A;  . KNEE ARTHROSCOPY  2008   left knee  . LAPAROTOMY N/A 07/10/2014   Procedure: EXPLORATORY LAPAROTOMY;  Surgeon: [name], MD;  Location: [facility];  Service: General;  Laterality: N/A;  . LOWER EXTREMITY ANGIOGRAM Bilateral 10/20/2013   Procedure: LOWER EXTREMITY ANGIOGRAM;  Surgeon: Elam Dutch, MD;  Location: [facility] CATH LAB;  Service: Cardiovascular;  Laterality: Bilateral;  . LYSIS OF ADHESION N/A 07/10/2014   Procedure: LYSIS OF ADHESION - 90 minutes;  Surgeon: [name], MD;  Location: [facility];  Service: General;  Laterality: N/A;  . PORTACATH PLACEMENT  08/10/2011   Procedure: INSERTION PORT-A-CATH;  Surgeon: Pierre Bali, MD;  Location: [facility];  Service: Thoracic;  Laterality: Left;  Left Subclavian  . PTOSIS REPAIR Bilateral 12-02-2015  . THROMBECTOMY ILIAC ARTERY Bilateral 10/31/2013   Procedure: THROMBECTOMY  ILIAC ARTERY;  Surgeon: Elam Dutch, MD;  Location: [facility];  Service: Vascular;  Laterality: Bilateral;  . TONSILLECTOMY     as a child  . VASECTOMY  70's  . VENTRAL HERNIA REPAIR N/A 04/04/2015   Procedure: OPEN REPAIR OF RECURRENT ABDOMINAL WALL HERNIA WITH MESH, COMPLEX LYSIS OF ADHESIONS X1 HR TAR COMPONENT SEPARATION;  Surgeon: Greer Pickerel, MD;  Location: [facility];  Service: General;  Laterality: N/A;       Home Medications    Prior to Admission medications   Medication Sig Start Date End Date Taking? Authorizing Provider  acidophilus (RISAQUAD) CAPS capsule Take 1 capsule by mouth daily.   Yes [provider]  albuterol (PROVENTIL HFA;VENTOLIN HFA) 108 (90 Base) MCG/ACT inhaler Inhale 1-2 puffs into the lungs every 6 (six) hours as needed for wheezing or shortness of breath.   Yes [provider]  aspirin EC 81 MG tablet Take 1 tablet (81 mg total) by mouth daily. 08/11/16  Yes Dorothy Spark, MD  furosemide (LASIX) 20 MG  tablet TAKE 1 TABLET (20 MG TOTAL) BY MOUTH DAILY. 03/01/17  Yes Dorothy Spark, MD  ketorolac (ACULAR) 0.5 % ophthalmic solution Place 1 drop into the left eye 4 (four) times daily. 03/02/17  Yes [provider]  L-ARGININE PO Take 1 tablet by mouth 2 (two) times daily.    Yes [provider]  metoprolol tartrate (LOPRESSOR) 25 MG tablet TAKE 1 TABLET (25 MG TOTAL) BY MOUTH 2 (TWO) TIMES DAILY. 02/25/17  Yes Dorothy Spark, MD  Multiple Vitamin (MULTIVITAMIN WITH MINERALS) TABS tablet Take 1 tablet by mouth daily.   Yes [provider]  ofloxacin (OCUFLOX) 0.3 % ophthalmic solution Place 1 drop into the left eye 4 (four) times daily. 03/02/17  Yes [provider]  OVER THE COUNTER MEDICATION Take 2 tablets by mouth 2 (two) times daily. Medication:  Lung Support   Yes [provider]  OVER THE COUNTER MEDICATION Take 1 tablet by mouth daily.   Yes [provider]  prednisoLONE acetate (PRED  FORTE) 1 % ophthalmic suspension Place 1 drop into the left eye 2 (two) times daily.   Yes [provider]  Specialty Vitamins Products (PROSTATE PO) Take 1 tablet by mouth 2 (two) times daily.   Yes [provider]  SYNTHROID 175 MCG tablet Take 175 mcg by mouth daily before breakfast.    Yes Stephens Shire, MD  tiotropium (SPIRIVA) 18 MCG inhalation capsule Place 18 mcg into inhaler and inhale daily.    Yes [provider]  vitamin B-12 (CYANOCOBALAMIN) 1000 MCG tablet Take 1,000 mcg by mouth daily.   Yes [provider]  acetaminophen (TYLENOL) 325 MG tablet Take 2 tablets (650 mg total) by mouth every 6 (six) hours as needed for mild pain (or Fever >/= 101). Patient not taking: Reported on 03/18/2017 10/25/16   Barton Dubois, MD  HYDROcodone-acetaminophen (NORCO/VICODIN) 5-325 MG tablet Take 1 tablet by mouth every 8 (eight) hours as needed for severe pain. Patient not taking: Reported on 03/18/2017 10/25/16   Barton Dubois, MD  polyethylene glycol [facility] / Floria Raveling) packet Take 17 g by mouth daily as needed for mild constipation. Patient not taking: Reported on 03/18/2017 10/25/16   Barton Dubois, MD    Family History Family History  Problem Relation Age of Onset  . Cancer Father   . Diabetes Father   . Hyperlipidemia Mother   . Hypertension Mother   . Hyperlipidemia Brother   . AAA (abdominal aortic aneurysm) Brother   . Hypertension Brother   . Hyperlipidemia Sister   . Hypertension Sister   . Anesthesia problems Neg Hx   . Hypotension Neg Hx   . Malignant hyperthermia Neg Hx   . Pseudochol deficiency Neg Hx   . Colon cancer Neg Hx     Social History Social History  Substance Use Topics  . Smoking status: Former Smoker    Packs/day: 1.50    Years: 40.00    Types: Cigarettes    Quit date: 07/02/2011  . Smokeless tobacco: Former Systems developer  . Alcohol use 0.0 oz/week     Comment: social     Allergies   Patient has no known  allergies.   Review of Systems Review of Systems  Constitutional: Negative for chills and fever.  HENT: Negative for congestion and facial swelling.   Eyes: Negative for discharge and visual disturbance.  Respiratory: Negative for shortness of breath.   Cardiovascular: Negative for chest pain and palpitations.  Gastrointestinal: Positive for abdominal pain  and nausea. Negative for diarrhea and vomiting.  Musculoskeletal: Positive for back pain. Negative for arthralgias and myalgias.  Skin: Negative for color change and rash.  Neurological: Negative for tremors, syncope and headaches.  Psychiatric/Behavioral: Negative for confusion and dysphoric mood.     Physical Exam Updated Vital Signs BP (!) 146/84   Pulse (!) 103   Temp 97.7 F (36.5 C) (Oral)   Resp 14   SpO2 91%   Physical Exam  Constitutional: He is oriented to person, place, and time. He appears well-developed and well-nourished.  HENT:  Head: Normocephalic and atraumatic.  Eyes: Pupils are equal, round, and reactive to light. EOM are normal.  Neck: Normal range of motion. Neck supple. No JVD present.  Cardiovascular: Normal rate and regular rhythm.  Exam reveals no gallop and no friction rub.   No murmur heard. Pulmonary/Chest: No respiratory distress. He has no wheezes.  Abdominal: He exhibits no distension. There is tenderness. There is no rebound and no guarding.  Distended abdomen tympanitic to percussion. No focal tenderness, Points to area of umbilicus as most severe of his pain.  Musculoskeletal: Normal range of motion.  Neurological: He is alert and oriented to person, place, and time.  Skin: No rash noted. No pallor.  Psychiatric: He has a normal mood and affect. His behavior is normal.  Nursing note and vitals reviewed.    ED Treatments / Results   DIAGNOSTIC STUDIES: Oxygen Saturation is 96% on RA, normal by my interpretation.   COORDINATION OF CARE: 2:22 AM-Discussed next steps with pt. Pt  verbalized understanding and is agreeable with the plan.   Labs (all labs ordered are listed, but only abnormal results are displayed) Labs Reviewed  COMPREHENSIVE METABOLIC PANEL - Abnormal; Notable for the following:       Result Value   Glucose, Bld 134 (*)    Alkaline Phosphatase 140 (*)    Total Bilirubin 1.4 (*)    All other components within normal limits  CBC - Abnormal; Notable for the following:    Hemoglobin 17.1 (*)    MCH 34.5 (*)    MCHC 36.8 (*)    Platelets 128 (*)    All other components within normal limits  I-STAT CHEM 8, ED - Abnormal; Notable for the following:    Glucose, Bld 134 (*)    Calcium, Ion 1.11 (*)    All other components within normal limits  LIPASE, BLOOD  URINALYSIS, ROUTINE W REFLEX MICROSCOPIC    EKG  EKG Interpretation None       Radiology Ct Abdomen Pelvis W Contrast  Result Date: 03/18/2017 CLINICAL DATA:  76 year old male with abdominal pain. History of small-bowel obstruction. EXAM: CT ABDOMEN AND PELVIS WITH CONTRAST TECHNIQUE: Multidetector CT imaging of the abdomen and pelvis was performed using the standard protocol following bolus administration of intravenous contrast. CONTRAST:  100 cc Isovue-300 COMPARISON:  CT of the abdomen pelvis dated 10/20/2016 FINDINGS: Lower chest: Emphysematous changes of the lungs with bibasilar atelectasis/ scarring. Multi vessel coronary vascular calcification. No intra-abdominal free air or free fluid. Hepatobiliary: Mild irregularity of the hepatic contour concerning for early cirrhosis. Correlation with clinical exam and liver function test recommended. Small amount of stone or sludge versus polyp within the gallbladder. No pericholecystic fluid. Pancreas: Unremarkable. No pancreatic ductal dilatation or surrounding inflammatory changes. Spleen: Normal in size without focal abnormality. Adrenals/Urinary Tract: The adrenal glands are unremarkable. The kidneys, visualized ureters, and urinary bladder are  unremarkable. Stomach/Bowel: The stomach is distended. No evidence  of gastric outlet obstruction. There is postsurgical changes of bowel with ileo ileal anastomosis in the anterior lower abdomen. There is dilatation of the loops of small bowel proximal to the anastomosis compatible with obstruction at the level of anastomosis likely related to anastomotic stricture or adhesions. There is abutment of the anastomosis segment of small bowel to the anterior peritoneal wall compatible with adhesions. There is mild edema in the adjacent mesentery. No fluid collection. There is extensive sigmoid and diffuse colonic diverticulosis with muscular hypertrophy. No active inflammatory changes. The appendix is normal. Vascular/Lymphatic: There is advanced aortoiliac atherosclerotic disease. The distal aorta measures 3.6 cm in diameter. 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 There is aneurysmal dilatation of the common iliac arteries measuring 2.4 cm on the left and 2.1 cm and the right. The origins of the celiac axis, SMA, IMA are patent. The SMV, splenic vein, and main portal vein are patent. No portal venous gas identified. A 1 cm nodular density at the gastroesophageal junction or along the distal esophagus (series 2, image 19) likely represents a lymph node. Top-normal periportal and portacaval lymph nodes. No adenopathy. Reproductive: Mild enlargement of the prostate gland measuring up to 5 cm in diameter. Other: Midline vertical anterior abdominal wall incisional scar. No fluid collection. Musculoskeletal: Osteopenia with degenerative changes of the spine. Bilateral L5 pars defects. L4-L5 and L5-S1 disc desiccation and vacuum phenomena. Endplate irregularity at L2-L3. No acute fracture. IMPRESSION: 1. Small-bowel obstruction with transition in the anterior lower abdomen at the ileo ileal anastomosis likely related to anastomotic stricture or adhesion. 2. Extensive colonic diverticulosis without active  inflammatory changes. Normal appendix. 3. Irregular hepatic contour concerning for underlying cirrhosis. Correlation with clinical exam and liver function test recommended. 4.  Aortic Atherosclerosis (ICD10-I70.0). 5. 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 Prior abdominal aortic aneurysm repair. The distal aorta measures 3.6 cm in diameter similar to prior CT. Recommend followup by ultrasound in 2 years. This recommendation follows ACR consensus guidelines: White Paper of the ACR Incidental Findings Committee II on Vascular Findings. J Am Coll Radiol 2013; 10:789-794. Electronically Signed   By: Anner Crete M.D.   On: 03/18/2017 04:22    Procedures Procedures (including critical care time)  Medications Ordered in ED Medications  dextrose 5 %-0.45 % sodium chloride infusion ( Intravenous New Bag/Given 03/18/17 0442)  morphine 4 MG/ML injection 4 mg (4 mg Intravenous Given 03/18/17 0301)  ondansetron (ZOFRAN) injection 4 mg (4 mg Intravenous Given 03/18/17 0300)  sodium chloride 0.9 % bolus 1,000 mL (0 mLs Intravenous Stopped 03/18/17 0415)  iopamidol (ISOVUE-300) 61 % injection 100 mL (100 mLs Intravenous Contrast Given 03/18/17 0343)  ondansetron (ZOFRAN) injection 4 mg (4 mg Intravenous Given 03/18/17 0414)  morphine 4 MG/ML injection 4 mg (4 mg Intravenous Given 03/18/17 0528)  ondansetron (ZOFRAN) injection 4 mg (4 mg Intravenous Given 03/18/17 0528)     Initial Impression / Assessment and Plan / ED Course  I have reviewed the triage vital signs and the nursing notes.  Pertinent labs & imaging results that were available during my care of the patient were reviewed by me and considered in my medical decision making (see chart for details).     76 yo M with a chief complaint of abdominal bloating and pain. Going on for the past few hours. Having some nausea but denies vomiting. Has passed some gas. Patient is concerned because it feels like a  prior small bowel obstruction. Will obtain  labs CT scan.  CT with SBO.  Patient with significant improvement in pain after IV pain medicine. Continues to have no vomiting. Will discuss with hospitalist.    The patients results and plan were reviewed and discussed.   Any x-rays performed were independently reviewed by myself.   Differential diagnosis were considered with the presenting HPI.  Medications  dextrose 5 %-0.45 % sodium chloride infusion ( Intravenous New Bag/Given 03/18/17 0442)  morphine 4 MG/ML injection 4 mg (4 mg Intravenous Given 03/18/17 0301)  ondansetron (ZOFRAN) injection 4 mg (4 mg Intravenous Given 03/18/17 0300)  sodium chloride 0.9 % bolus 1,000 mL (0 mLs Intravenous Stopped 03/18/17 0415)  iopamidol (ISOVUE-300) 61 % injection 100 mL (100 mLs Intravenous Contrast Given 03/18/17 0343)  ondansetron (ZOFRAN) injection 4 mg (4 mg Intravenous Given 03/18/17 0414)  morphine 4 MG/ML injection 4 mg (4 mg Intravenous Given 03/18/17 0528)  ondansetron (ZOFRAN) injection 4 mg (4 mg Intravenous Given 03/18/17 0528)    Vitals:   03/18/17 0316 03/18/17 0330 03/18/17 0430 03/18/17 0500  BP:  (!) 128/96 (!) 141/87 (!) 146/84  Pulse:  100 (!) 103 (!) 103  Resp:  19 13 14   Temp:      TempSrc:      SpO2: 90% 94% 94% 91%    Final diagnoses:  SBO (small bowel obstruction) ([place])    Admission/ observation were discussed with the admitting physician, patient and/or family and they are comfortable with the plan.    Final Clinical Impressions(s) / ED Diagnoses   Final diagnoses:  SBO (small bowel obstruction) ([facility])    New Prescriptions New Prescriptions   No medications on file   I personally performed the services described in this documentation, which was scribed in my presence. The recorded information has been reviewed and is accurate.      Deno Etienne, DO 03/18/17 [place]

## 2017-03-18 NOTE — ED Triage Notes (Signed)
Pt states he thinks he may have a bowel obstruction  Pt states he feels distended, is having abd cramping with sharp pains up between his shoulder blades, passing a small amt of gas, no BM since 8pm but it was small, and nausea  Pt states he has a tendency to go into atrial fib when this happens

## 2017-03-18 NOTE — Progress Notes (Signed)
03/18/17  1200 Flushed NGT with 188mL of air

## 2017-03-18 NOTE — Progress Notes (Signed)
03/18/17 1230  Called radiology [phone] to confirm that they were aware of the KUB 8 hrs after contrast.  Contrast given at 1130. Per radiology tech she will make sure someone comes at 1930 to due KUB.

## 2017-03-18 NOTE — Progress Notes (Signed)
03/18/17  0740  CMP and CBC scheduled at 0620 were not drawn due to port a cath not drawing back. Patient had CBC and CMP at 0245 today.

## 2017-03-18 NOTE — H&P (Signed)
History and Physical    Joseph [name] URK:[id number] DOB: 1941-03-16 DOA: 03/18/2017  PCP: [name], MD  Patient coming from: Home.  Chief Complaint: Abdominal pain.  HPI: Joseph [name] is a 76 y.o. male with history of paroxysmal atrial fibrillation, hypertension, COPD, diastolic dysfunction who was admitted in February of this year for small bowel obstruction managed conservatively presents to the ER with complains of abdominal pain. Patient's abdominal pain started last evening after supper. Patient also had a bowel movement at that time. Pain was mostly in the lower abdomen which became more diffuse and started radiating to the back. Denies any vomiting but did have some nausea. Since patient has similar symptoms last time when patient had bowel obstruction patient presented to the ER.   ED Course: CT of the abdomen shows bowel obstruction with transition point. On-call surgeon Dr. Ninfa Linden has been consulted. Patient's pain improved with narcotics. On exam patient's abdomen is still distended. Bowel sounds are feeble.  Review of Systems: As per HPI, rest all negative.   Past Medical History:  Diagnosis Date  . Acute on chronic diastolic CHF (congestive heart failure), NYHA class 3 ([place]) 07/23/2014  . Arthritis    left knee and back arthrtis  . Atrial fibrillation with rapid ventricular response ([place]) 07/04/2014  . CAD (coronary artery disease)   . Cataract   . Cirrhosis ([place])    By radiography, seen on CT  . Diverticulosis   . Emphysema   . Enlarged prostate   . Heart murmur    teen  . History of colon polyps 2006,2008  . History of transfusion 2015  . Hx of radiation therapy 09/02/11 to 10/19/11   chest  . Hx of radiation therapy   . Hyperlipidemia    takes Pravastatin  . Hypothyroidism    takes Synthroid daily  . Lung cancer ([place])    right upper lobe  . Personal history of adenomatous colonic polyps 07/04/2012   2006, adenomatous right colon polyp  removed, then ablated in 2007 followup, 2008, adenomatous polyp of the right colon removed with hot biopsy forceps. All by Dr. Lajoyce Corners 01/03/2013 diminutive polyp ascending colon - adenoma    . Rosacea   . Shortness of breath    with exertion  . Small bowel obstruction ([place]) 07/05/2014  . Status post chemotherapy    last chemo 2013  . Umbilical hernia, incarcerated- surgery 07/10/14 07/05/2014    Past Surgical History:  Procedure Laterality Date  . ABDOMINAL AORTAGRAM  10/20/2013   Procedure: ABDOMINAL Maxcine Ham;  Surgeon: Elam Dutch, MD;  Location: [facility] CATH LAB;  Service: Cardiovascular;;  . ABDOMINAL AORTIC ANEURYSM REPAIR N/A 10/31/2013   Procedure: aorto to left external iliac and right external iliac and left internal iliac, reimplantation of inferior mesenteric artery and ligation of left iliac artery aneursym;  Surgeon: Elam Dutch, MD;  Location: [facility];  Service: Vascular;  Laterality: N/A;  . ABDOMINAL WOUND DEHISCENCE N/A 11/10/2013   Procedure: ABDOMINAL WOUND CLOSURE ;  Surgeon: Serafina Mitchell, MD;  Location: [facility];  Service: Vascular;  Laterality: N/A;  . bletharoplasty    . BOWEL RESECTION N/A 07/10/2014   Procedure: SMALL BOWEL RESECTION;  Surgeon: [name], MD;  Location: [facility];  Service: General;  Laterality: N/A;  . CARDIAC CATHETERIZATION  2003  . COLONOSCOPY W/ BIOPSIES     multiple   . ESOPHAGOGASTRODUODENOSCOPY  2006  . FINGER SURGERY  2008   left pointer finger  .  HERNIA REPAIR  70's  . INCISIONAL HERNIA REPAIR N/A 07/10/2014   Procedure: HERNIA REPAIR INCISIONAL;  Surgeon: [name], MD;  Location: [facility];  Service: General;  Laterality: N/A;  . KNEE ARTHROSCOPY  2008   left knee  . LAPAROTOMY N/A 07/10/2014   Procedure: EXPLORATORY LAPAROTOMY;  Surgeon: [name], MD;  Location: [place];  Service: General;  Laterality: N/A;  . LOWER EXTREMITY ANGIOGRAM Bilateral 10/20/2013   Procedure: LOWER EXTREMITY ANGIOGRAM;  Surgeon: Elam Dutch, MD;   Location: [facility] CATH LAB;  Service: Cardiovascular;  Laterality: Bilateral;  . LYSIS OF ADHESION N/A 07/10/2014   Procedure: LYSIS OF ADHESION - 90 minutes;  Surgeon: [name], MD;  Location: [place];  Service: General;  Laterality: N/A;  . PORTACATH PLACEMENT  08/10/2011   Procedure: INSERTION PORT-A-CATH;  Surgeon: Pierre Bali, MD;  Location: [place];  Service: Thoracic;  Laterality: Left;  Left Subclavian  . PTOSIS REPAIR Bilateral 12-02-2015  . THROMBECTOMY ILIAC ARTERY Bilateral 10/31/2013   Procedure: THROMBECTOMY ILIAC ARTERY;  Surgeon: Elam Dutch, MD;  Location: [facility];  Service: Vascular;  Laterality: Bilateral;  . TONSILLECTOMY     as a child  . VASECTOMY  70's  . VENTRAL HERNIA REPAIR N/A 04/04/2015   Procedure: OPEN REPAIR OF RECURRENT ABDOMINAL WALL HERNIA WITH MESH, COMPLEX LYSIS OF ADHESIONS X1 HR TAR COMPONENT SEPARATION;  Surgeon: Greer Pickerel, MD;  Location: [facility];  Service: General;  Laterality: N/A;     reports that he quit smoking about 5 years ago. His smoking use included Cigarettes. He has a 60.00 pack-year smoking history. He has quit using smokeless tobacco. He reports that he drinks alcohol. He reports that he does not use drugs.  No Known Allergies  Family History  Problem Relation Age of Onset  . Cancer Father   . Diabetes Father   . Hyperlipidemia Mother   . Hypertension Mother   . Hyperlipidemia Brother   . AAA (abdominal aortic aneurysm) Brother   . Hypertension Brother   . Hyperlipidemia Sister   . Hypertension Sister   . Anesthesia problems Neg Hx   . Hypotension Neg Hx   . Malignant hyperthermia Neg Hx   . Pseudochol deficiency Neg Hx   . Colon cancer Neg Hx     Prior to Admission medications   Medication Sig Start Date End Date Taking? Authorizing Provider  acidophilus (RISAQUAD) CAPS capsule Take 1 capsule by mouth daily.   Yes [provider]  albuterol (PROVENTIL HFA;VENTOLIN HFA) 108 (90 Base) MCG/ACT inhaler Inhale 1-2  puffs into the lungs every 6 (six) hours as needed for wheezing or shortness of breath.   Yes [provider]  aspirin EC 81 MG tablet Take 1 tablet (81 mg total) by mouth daily. 08/11/16  Yes Dorothy Spark, MD  furosemide (LASIX) 20 MG tablet TAKE 1 TABLET (20 MG TOTAL) BY MOUTH DAILY. 03/01/17  Yes Dorothy Spark, MD  ketorolac (ACULAR) 0.5 % ophthalmic solution Place 1 drop into the left eye 4 (four) times daily. 03/02/17  Yes [provider]  L-ARGININE PO Take 1 tablet by mouth 2 (two) times daily.    Yes [provider]  metoprolol tartrate (LOPRESSOR) 25 MG tablet TAKE 1 TABLET (25 MG TOTAL) BY MOUTH 2 (TWO) TIMES DAILY. 02/25/17  Yes Dorothy Spark, MD  Multiple Vitamin (MULTIVITAMIN WITH MINERALS) TABS tablet Take 1 tablet by mouth daily.   Yes [provider]  ofloxacin (  OCUFLOX) 0.3 % ophthalmic solution Place 1 drop into the left eye 4 (four) times daily. 03/02/17  Yes [provider]  OVER THE COUNTER MEDICATION Take 2 tablets by mouth 2 (two) times daily. Medication:  Lung Support   Yes [provider]  OVER THE COUNTER MEDICATION Take 1 tablet by mouth daily.   Yes [provider]  prednisoLONE acetate (PRED FORTE) 1 % ophthalmic suspension Place 1 drop into the left eye 2 (two) times daily.   Yes [provider]  Specialty Vitamins Products (PROSTATE PO) Take 1 tablet by mouth 2 (two) times daily.   Yes [provider]  SYNTHROID 175 MCG tablet Take 175 mcg by mouth daily before breakfast.    Yes Stephens Shire, MD  tiotropium (SPIRIVA) 18 MCG inhalation capsule Place 18 mcg into inhaler and inhale daily.    Yes [provider]  vitamin B-12 (CYANOCOBALAMIN) 1000 MCG tablet Take 1,000 mcg by mouth daily.   Yes [provider]  acetaminophen (TYLENOL) 325 MG tablet Take 2 tablets (650 mg total) by mouth every 6 (six) hours as needed for mild pain (or Fever >/= 101). Patient not  taking: Reported on 03/18/2017 10/25/16   Barton Dubois, MD  HYDROcodone-acetaminophen (NORCO/VICODIN) 5-325 MG tablet Take 1 tablet by mouth every 8 (eight) hours as needed for severe pain. Patient not taking: Reported on 03/18/2017 10/25/16   Barton Dubois, MD  polyethylene glycol [facility] / Floria Raveling) packet Take 17 g by mouth daily as needed for mild constipation. Patient not taking: Reported on 03/18/2017 10/25/16   Barton Dubois, MD    Physical Exam: Vitals:   03/18/17 0330 03/18/17 0430 03/18/17 0500 03/18/17 0553  BP: (!) 128/96 (!) 141/87 (!) 146/84 125/84  Pulse: 100 (!) 103 (!) 103 (!) 106  Resp: 19 13 14 16   Temp:    98 F (36.7 C)  TempSrc:    Oral  SpO2: 94% 94% 91% 95%  Weight:    98.1 kg (216 lb 4.3 oz)  Height:    6' (1.829 m)      Constitutional: Moderately built and nourished. Vitals:   03/18/17 0330 03/18/17 0430 03/18/17 0500 03/18/17 0553  BP: (!) 128/96 (!) 141/87 (!) 146/84 125/84  Pulse: 100 (!) 103 (!) 103 (!) 106  Resp: 19 13 14 16   Temp:    98 F (36.7 C)  TempSrc:    Oral  SpO2: 94% 94% 91% 95%  Weight:    98.1 kg (216 lb 4.3 oz)  Height:    6' (1.829 m)   Eyes: Anicteric no pallor. ENMT: No discharge from the ears eyes nose or mouth. Neck: No mass felt. No neck rigidity. Respiratory: No rhonchi or crepitations. Cardiovascular: S1 and S2 heard no murmurs appreciated. Abdomen: Distended nontender bowel sounds feeble. Musculoskeletal: No edema. No joint effusion. Skin: No rash. Skin appears warm. Neurologic: Alert awake oriented to time place and person. Moves all extremities. Psychiatric: Appears normal. Normal affect.   Labs on Admission: I have personally reviewed following labs and imaging studies  CBC:  Recent Labs Lab 03/18/17 0249 03/18/17 0304  WBC 9.2  --   HGB 17.1* 17.0  HCT 46.5 50.0  MCV 93.9  --   PLT 128*  --    Basic Metabolic Panel:  Recent Labs Lab 03/18/17 0249 03/18/17 0304  NA 138 140  K 3.9 3.8  CL 104  104  CO2 24  --   GLUCOSE 134* 134*  BUN 20 20  CREATININE 1.01 0.90  CALCIUM 9.5  --    GFR: Estimated Creatinine Clearance: 87.4 mL/min (by C-G formula based on SCr of 0.9 mg/dL). Liver Function Tests:  Recent Labs Lab 03/18/17 0249  AST 41  ALT 31  ALKPHOS 140*  BILITOT 1.4*  PROT 7.9  ALBUMIN 3.9    Recent Labs Lab 03/18/17 0249  LIPASE 29   No results for input(s): AMMONIA in the last 168 hours. Coagulation Profile: No results for input(s): INR, PROTIME in the last 168 hours. Cardiac Enzymes: No results for input(s): CKTOTAL, CKMB, CKMBINDEX, TROPONINI in the last 168 hours. BNP (last 3 results) No results for input(s): PROBNP in the last 8760 hours. HbA1C: No results for input(s): HGBA1C in the last 72 hours. CBG: No results for input(s): GLUCAP in the last 168 hours. Lipid Profile: No results for input(s): CHOL, HDL, LDLCALC, TRIG, CHOLHDL, LDLDIRECT in the last 72 hours. Thyroid Function Tests: No results for input(s): TSH, T4TOTAL, FREET4, T3FREE, THYROIDAB in the last 72 hours. Anemia Panel: No results for input(s): VITAMINB12, FOLATE, FERRITIN, TIBC, IRON, RETICCTPCT in the last 72 hours. Urine analysis:    Component Value Date/Time   COLORURINE AMBER (A) 10/20/2016 1800   APPEARANCEUR HAZY (A) 10/20/2016 1800   LABSPEC >1.046 (H) 10/20/2016 1800   PHURINE 5.0 10/20/2016 1800   GLUCOSEU NEGATIVE 10/20/2016 1800   HGBUR SMALL (A) 10/20/2016 1800   BILIRUBINUR NEGATIVE 10/20/2016 1800   KETONESUR NEGATIVE 10/20/2016 1800   PROTEINUR NEGATIVE 10/20/2016 1800   UROBILINOGEN 2.0 (H) 07/15/2014 1101   NITRITE NEGATIVE 10/20/2016 1800   LEUKOCYTESUR NEGATIVE 10/20/2016 1800   Sepsis Labs: @LABRCNTIP (procalcitonin:4,lacticidven:4) )No results found for this or any previous visit (from the past 240 hour(s)).   Radiological Exams on Admission: Ct Abdomen Pelvis W Contrast  Result Date: 03/18/2017 CLINICAL DATA:  76 year old male with abdominal pain.  History of small-bowel obstruction. EXAM: CT ABDOMEN AND PELVIS WITH CONTRAST TECHNIQUE: Multidetector CT imaging of the abdomen and pelvis was performed using the standard protocol following bolus administration of intravenous contrast. CONTRAST:  100 cc Isovue-300 COMPARISON:  CT of the abdomen pelvis dated 10/20/2016 FINDINGS: Lower chest: Emphysematous changes of the lungs with bibasilar atelectasis/ scarring. Multi vessel coronary vascular calcification. No intra-abdominal free air or free fluid. Hepatobiliary: Mild irregularity of the hepatic contour concerning for early cirrhosis. Correlation with clinical exam and liver function test recommended. Small amount of stone or sludge versus polyp within the gallbladder. No pericholecystic fluid. Pancreas: Unremarkable. No pancreatic ductal dilatation or surrounding inflammatory changes. Spleen: Normal in size without focal abnormality. Adrenals/Urinary Tract: The adrenal glands are unremarkable. The kidneys, visualized ureters, and urinary bladder are unremarkable. Stomach/Bowel: The stomach is distended. No evidence of gastric outlet obstruction. There is postsurgical changes of bowel with ileo ileal anastomosis in the anterior lower abdomen. There is dilatation of the loops of small bowel proximal to the anastomosis compatible with obstruction at the level of anastomosis likely related to anastomotic stricture or adhesions. There is abutment of the anastomosis segment of small bowel to the anterior peritoneal wall compatible with adhesions. There is mild edema in the adjacent mesentery. No fluid collection. There is extensive sigmoid and diffuse colonic diverticulosis with muscular hypertrophy. No active inflammatory changes. The appendix is normal. Vascular/Lymphatic: There is advanced aortoiliac atherosclerotic disease. The distal aorta measures 3.6 cm in diameter. 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 There is aneurysmal dilatation of the  common iliac arteries measuring 2.4 cm on the left and 2.1 cm and  the right. The origins of the celiac axis, SMA, IMA are patent. The SMV, splenic vein, and main portal vein are patent. No portal venous gas identified. A 1 cm nodular density at the gastroesophageal junction or along the distal esophagus (series 2, image 19) likely represents a lymph node. Top-normal periportal and portacaval lymph nodes. No adenopathy. Reproductive: Mild enlargement of the prostate gland measuring up to 5 cm in diameter. Other: Midline vertical anterior abdominal wall incisional scar. No fluid collection. Musculoskeletal: Osteopenia with degenerative changes of the spine. Bilateral L5 pars defects. L4-L5 and L5-S1 disc desiccation and vacuum phenomena. Endplate irregularity at L2-L3. No acute fracture. IMPRESSION: 1. Small-bowel obstruction with transition in the anterior lower abdomen at the ileo ileal anastomosis likely related to anastomotic stricture or adhesion. 2. Extensive colonic diverticulosis without active inflammatory changes. Normal appendix. 3. Irregular hepatic contour concerning for underlying cirrhosis. Correlation with clinical exam and liver function test recommended. 4.  Aortic Atherosclerosis (ICD10-I70.0). 5. 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 Prior abdominal aortic aneurysm repair. The distal aorta measures 3.6 cm in diameter similar to prior CT. Recommend followup by ultrasound in 2 years. This recommendation follows ACR consensus guidelines: White Paper of the ACR Incidental Findings Committee II on Vascular Findings. J Am Coll Radiol 2013; 10:789-794. Electronically Signed   By: Anner Crete M.D.   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 Small bowel obstruction - general surgeon Dr. Ninfa Linden has been consulted. I have ordered NG tube with low intermittent suction. Will place patient nothing by mouth on IV fluids and medications. Further recommendations per surgery. 2. Paroxysmal atrial fibrillation - presently in sinus tachycardia. Since patient cannot take oral metoprolol I have place patient on scheduled IV metoprolol with holding parameters. Not on anticoagulation secondary to history of cirrhosis and risk for bleeding. 3. COPD - not wheezing. On inhalers. 4. Hypothyroidism - on IV Synthroid for now. 5. Diastolic dysfunction - patient receiving IV fluids. Closely monitor respiratory status. 6. History of lung cancer in remission.  I have reviewed old charts and labs.   DVT prophylaxis: SCDs. Code Status: Full code.  Family Communication: Wife. Disposition Plan: Home.  Consults called: General surgery.  Admission status: Inpatient.    Rise Patience MD [facility] Pager [phone].  If 7PM-7AM, please contact night-coverage [email or web address] Password [facility]  03/18/2017, 6:27 AM

## 2017-03-18 NOTE — Progress Notes (Signed)
  PROGRESS NOTE  Patient admitted earlier this morning. See H&P. Joseph [name] is a 76 y.o. male with history of paroxysmal atrial fibrillation, hypertension, COPD, diastolic dysfunction who was admitted in February of this year for small bowel obstruction managed conservatively presents to the ER with complains of abdominal pain. Pain was mostly in the lower abdomen which became more diffuse and started radiating to the back. Denies any vomiting but did have some nausea. CT of the abdomen shows bowel obstruction with transition point. General surgery was consulted. Patient states that since NGT placement, abdominal pain and distention has improved a little bit. Continues to have nausea without vomiting.   General surgery following Continue NGT, NPO, IVF, SB protocol    Dessa Phi, DO [facility] [email or web address] Password [email or web address] 03/18/2017, 10:40 AM

## 2017-03-18 NOTE — Consult Note (Signed)
Reason for Consult:  SBO Referring Physician: Gala Lewandowsky PCP:  [name], MD Oncology:  Antionette Poles MD CC:  Abdominal pain, and nausea  Joseph [name] is an 76 y.o. male.  HPI: Pt know to our service who we saw last 10/20/16-10/25/16.  He has a hx of AAA repair, abdominal dehiscence 2015\small bowel resection and hernia repair 07/10/14, ventral hernia repair 04/2015, by Dr. Redmond Pulling.  He did well with conservative management last admit, 10/2016 and now returns with another recurrent obstruction  Pt reports some distension over the last week but severe symptom onset last PM after supper.  He had distension, and nausea with a small  BM at that time.  He notes he usually has loose stools with onset of SBO, this stool was more solid.     Work up in our ED this AM shows: he is afebrile, BP up some and slightly tachycardic, good O2 saturations. BMP OK, H/H is elevated, WBC is normal. Platelets are low.   CT scan shows: Emphysematous changes of the lungs with bibasilar atelectasis/ scarring No intra-abdominal free air or free fluid.Mild irregularity of the hepatic contour concerning for early cirrhosis. Small-bowel obstruction with transition in the anterior lower abdomen at the ileo ileal anastomosis likely related to anastomotic stricture or adhesion.  Extensive colonic diverticulosis without active inflammatory changes. Normal appendix. 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 Prior abdominal aortic aneurysm repair.  The distal aorta measures 3.6 cm in diameter similar to prior CT.  We are ask to see.  Marland Kitchen dextrose 5 % and 0.45% NaCl 100 mL/hr at 03/18/17 0442        Past Medical History:  Diagnosis Date  . Acute on chronic diastolic CHF (congestive heart failure), NYHA class 3 ([place]) 07/23/2014  . Arthritis    left knee and back arthrtis  . Atrial fibrillation with rapid ventricular response ([place]) 07/04/2014  . CAD (coronary artery disease)    . Cataract   . Cirrhosis ([place])    By radiography, seen on CT  . Diverticulosis   . Emphysema   . Enlarged prostate   . Heart murmur    teen  . History of colon polyps 2006,2008  . History of transfusion 2015  . Hx of radiation therapy 09/02/11 to 10/19/11   chest  . Hx of radiation therapy   . Hyperlipidemia    takes Pravastatin  . Hypothyroidism    takes Synthroid daily  . Lung cancer ([place])    right upper lobe  . Personal history of adenomatous colonic polyps 07/04/2012   2006, adenomatous right colon polyp removed, then ablated in 2007 followup, 2008, adenomatous polyp of the right colon removed with hot biopsy forceps. All by Dr. Lajoyce Corners 01/03/2013 diminutive polyp ascending colon - adenoma    . Rosacea   . Shortness of breath    with exertion  . Small bowel obstruction ([place]) 07/05/2014  . Status post chemotherapy    last chemo 2013  . Umbilical hernia, incarcerated- surgery 07/10/14 07/05/2014    Past Surgical History:  Procedure Laterality Date  . ABDOMINAL AORTAGRAM  10/20/2013   Procedure: ABDOMINAL Maxcine Ham;  Surgeon: Elam Dutch, MD;  Location: [facility] CATH LAB;  Service: Cardiovascular;;  . ABDOMINAL AORTIC ANEURYSM REPAIR N/A 10/31/2013   Procedure: aorto to left external iliac and right external iliac and left internal iliac, reimplantation of inferior mesenteric artery and ligation of left iliac artery aneursym;  Surgeon: Elam Dutch, MD;  Location:  [facility] OR;  Service: Vascular;  Laterality: N/A;  . ABDOMINAL WOUND DEHISCENCE N/A 11/10/2013   Procedure: ABDOMINAL WOUND CLOSURE ;  Surgeon: Serafina Mitchell, MD;  Location: [place];  Service: Vascular;  Laterality: N/A;  . bletharoplasty    . BOWEL RESECTION N/A 07/10/2014   Procedure: SMALL BOWEL RESECTION;  Surgeon: [name], MD;  Location: [place];  Service: General;  Laterality: N/A;  . CARDIAC CATHETERIZATION  2003  . COLONOSCOPY W/ BIOPSIES     multiple   . ESOPHAGOGASTRODUODENOSCOPY  2006  . FINGER SURGERY  2008    left pointer finger  . HERNIA REPAIR  70's  . INCISIONAL HERNIA REPAIR N/A 07/10/2014   Procedure: HERNIA REPAIR INCISIONAL;  Surgeon: [name], MD;  Location: [place];  Service: General;  Laterality: N/A;  . KNEE ARTHROSCOPY  2008   left knee  . LAPAROTOMY N/A 07/10/2014   Procedure: EXPLORATORY LAPAROTOMY;  Surgeon: [name], MD;  Location: [place];  Service: General;  Laterality: N/A;  . LOWER EXTREMITY ANGIOGRAM Bilateral 10/20/2013   Procedure: LOWER EXTREMITY ANGIOGRAM;  Surgeon: Elam Dutch, MD;  Location: [facility] CATH LAB;  Service: Cardiovascular;  Laterality: Bilateral;  . LYSIS OF ADHESION N/A 07/10/2014   Procedure: LYSIS OF ADHESION - 90 minutes;  Surgeon: [name], MD;  Location: [place];  Service: General;  Laterality: N/A;  . PORTACATH PLACEMENT  08/10/2011   Procedure: INSERTION PORT-A-CATH;  Surgeon: Pierre Bali, MD;  Location: [place];  Service: Thoracic;  Laterality: Left;  Left Subclavian  . PTOSIS REPAIR Bilateral 12-02-2015  . THROMBECTOMY ILIAC ARTERY Bilateral 10/31/2013   Procedure: THROMBECTOMY ILIAC ARTERY;  Surgeon: Elam Dutch, MD;  Location: [place];  Service: Vascular;  Laterality: Bilateral;  . TONSILLECTOMY     as a child  . VASECTOMY  70's  . VENTRAL HERNIA REPAIR N/A 04/04/2015   Procedure: OPEN REPAIR OF RECURRENT ABDOMINAL WALL HERNIA WITH MESH, COMPLEX LYSIS OF ADHESIONS X1 HR TAR COMPONENT SEPARATION;  Surgeon: Greer Pickerel, MD;  Location: [facility] ORS;  Service: General;  Laterality: N/A;    Family History  Problem Relation Age of Onset  . Cancer Father   . Diabetes Father   . Hyperlipidemia Mother   . Hypertension Mother   . Hyperlipidemia Brother   . AAA (abdominal aortic aneurysm) Brother   . Hypertension Brother   . Hyperlipidemia Sister   . Hypertension Sister   . Anesthesia problems Neg Hx   . Hypotension Neg Hx   . Malignant hyperthermia Neg Hx   . Pseudochol deficiency Neg Hx   . Colon cancer Neg Hx     Social History:   reports that he quit smoking about 5 years ago. His smoking use included Cigarettes. He has a 60.00 pack-year smoking history. He has quit using smokeless tobacco. He reports that he drinks alcohol. He reports that he does not use drugs. Tobacco smoked 54 years 0.5-2PPD, quit age 85 ETOH:  Social Drugs:  None Multiple jobs, retired from Psychologist, educational job Married and wife is with him   Allergies: No Known Allergies  Prior to Admission medications   Medication Sig Start Date End Date Taking? Authorizing Provider  acidophilus (RISAQUAD) CAPS capsule Take 1 capsule by mouth daily.   Yes [provider]  albuterol (PROVENTIL HFA;VENTOLIN HFA) 108 (90 Base) MCG/ACT inhaler Inhale 1-2 puffs into the lungs every 6 (six) hours as needed for wheezing or shortness of breath.   Yes [provider]  aspirin EC 81 MG tablet Take 1 tablet (81 mg total) by mouth daily. 08/11/16  Yes Dorothy Spark, MD  furosemide (LASIX) 20 MG tablet TAKE 1 TABLET (20 MG TOTAL) BY MOUTH DAILY. 03/01/17  Yes Dorothy Spark, MD  ketorolac (ACULAR) 0.5 % ophthalmic solution Place 1 drop into the left eye 4 (four) times daily. 03/02/17  Yes [provider]  L-ARGININE PO Take 1 tablet by mouth 2 (two) times daily.    Yes [provider]  metoprolol tartrate (LOPRESSOR) 25 MG tablet TAKE 1 TABLET (25 MG TOTAL) BY MOUTH 2 (TWO) TIMES DAILY. 02/25/17  Yes Dorothy Spark, MD  Multiple Vitamin (MULTIVITAMIN WITH MINERALS) TABS tablet Take 1 tablet by mouth daily.   Yes [provider]  ofloxacin (OCUFLOX) 0.3 % ophthalmic solution Place 1 drop into the left eye 4 (four) times daily. 03/02/17  Yes [provider]  OVER THE COUNTER MEDICATION Take 2 tablets by mouth 2 (two) times daily. Medication:  Lung Support   Yes [provider]  OVER THE COUNTER MEDICATION Take 1 tablet by mouth daily.   Yes [provider]  prednisoLONE acetate (PRED FORTE) 1 %  ophthalmic suspension Place 1 drop into the left eye 2 (two) times daily.   Yes [provider]  Specialty Vitamins Products (PROSTATE PO) Take 1 tablet by mouth 2 (two) times daily.   Yes [provider]  SYNTHROID 175 MCG tablet Take 175 mcg by mouth daily before breakfast.    Yes Stephens Shire, MD  tiotropium (SPIRIVA) 18 MCG inhalation capsule Place 18 mcg into inhaler and inhale daily.    Yes [provider]  vitamin B-12 (CYANOCOBALAMIN) 1000 MCG tablet Take 1,000 mcg by mouth daily.   Yes [provider]  acetaminophen (TYLENOL) 325 MG tablet Take 2 tablets (650 mg total) by mouth every 6 (six) hours as needed for mild pain (or Fever >/= 101). Patient not taking: Reported on 03/18/2017 10/25/16   Barton Dubois, MD  HYDROcodone-acetaminophen (NORCO/VICODIN) 5-325 MG tablet Take 1 tablet by mouth every 8 (eight) hours as needed for severe pain. Patient not taking: Reported on 03/18/2017 10/25/16   Barton Dubois, MD  polyethylene glycol [facility] / Floria Raveling) packet Take 17 g by mouth daily as needed for mild constipation. Patient not taking: Reported on 03/18/2017 10/25/16   Barton Dubois, MD     Results for orders placed or performed during the hospital encounter of 03/18/17 (from the past 48 hour(s))  Lipase, blood     Status: None   Collection Time: 03/18/17  2:49 AM  Result Value Ref Range   Lipase 29 11 - 51 U/L  Comprehensive metabolic panel     Status: Abnormal   Collection Time: 03/18/17  2:49 AM  Result Value Ref Range   Sodium 138 135 - 145 mmol/L   Potassium 3.9 3.5 - 5.1 mmol/L   Chloride 104 101 - 111 mmol/L   CO2 24 22 - 32 mmol/L   Glucose, Bld 134 (H) 65 - 99 mg/dL   BUN 20 6 - 20 mg/dL   Creatinine, Ser 1.01 0.61 - 1.24 mg/dL   Calcium 9.5 8.9 - 10.3 mg/dL   Total Protein 7.9 6.5 - 8.1 g/dL   Albumin 3.9 3.5 - 5.0 g/dL   AST 41 15 - 41 U/L   ALT 31 17 - 63 U/L   Alkaline Phosphatase 140 (H) 38 - 126 U/L   Total Bilirubin 1.4  (  H) 0.3 - 1.2 mg/dL   GFR calc non Af Amer >60 >60 mL/min   GFR calc Af Amer >60 >60 mL/min    Comment: (NOTE) The eGFR has been calculated using the CKD EPI equation. This calculation has not been validated in all clinical situations. eGFR's persistently <60 mL/min signify possible Chronic Kidney Disease.    Anion gap 10 5 - 15  CBC     Status: Abnormal   Collection Time: 03/18/17  2:49 AM  Result Value Ref Range   WBC 9.2 4.0 - 10.5 K/uL   RBC 4.95 4.22 - 5.81 MIL/uL   Hemoglobin 17.1 (H) 13.0 - 17.0 g/dL   HCT 46.5 39.0 - 52.0 %   MCV 93.9 78.0 - 100.0 fL   MCH 34.5 (H) 26.0 - 34.0 pg   MCHC 36.8 (H) 30.0 - 36.0 g/dL   RDW 14.2 11.5 - 15.5 %   Platelets 128 (L) 150 - 400 K/uL  I-Stat Chem 8, ED     Status: Abnormal   Collection Time: 03/18/17  3:04 AM  Result Value Ref Range   Sodium 140 135 - 145 mmol/L   Potassium 3.8 3.5 - 5.1 mmol/L   Chloride 104 101 - 111 mmol/L   BUN 20 6 - 20 mg/dL   Creatinine, Ser 0.90 0.61 - 1.24 mg/dL   Glucose, Bld 134 (H) 65 - 99 mg/dL   Calcium, Ion 1.11 (L) 1.15 - 1.40 mmol/L   TCO2 24 0 - 100 mmol/L   Hemoglobin 17.0 13.0 - 17.0 g/dL   HCT 50.0 39.0 - 52.0 %    Ct Abdomen Pelvis W Contrast  Result Date: 03/18/2017 CLINICAL DATA:  76 year old male with abdominal pain. History of small-bowel obstruction. EXAM: CT ABDOMEN AND PELVIS WITH CONTRAST TECHNIQUE: Multidetector CT imaging of the abdomen and pelvis was performed using the standard protocol following bolus administration of intravenous contrast. CONTRAST:  100 cc Isovue-300 COMPARISON:  CT of the abdomen pelvis dated 10/20/2016 FINDINGS: Lower chest: Emphysematous changes of the lungs with bibasilar atelectasis/ scarring. Multi vessel coronary vascular calcification. No intra-abdominal free air or free fluid. Hepatobiliary: Mild irregularity of the hepatic contour concerning for early cirrhosis. Correlation with clinical exam and liver function test recommended. Small amount of stone or  sludge versus polyp within the gallbladder. No pericholecystic fluid. Pancreas: Unremarkable. No pancreatic ductal dilatation or surrounding inflammatory changes. Spleen: Normal in size without focal abnormality. Adrenals/Urinary Tract: The adrenal glands are unremarkable. The kidneys, visualized ureters, and urinary bladder are unremarkable. Stomach/Bowel: The stomach is distended. No evidence of gastric outlet obstruction. There is postsurgical changes of bowel with ileo ileal anastomosis in the anterior lower abdomen. There is dilatation of the loops of small bowel proximal to the anastomosis compatible with obstruction at the level of anastomosis likely related to anastomotic stricture or adhesions. There is abutment of the anastomosis segment of small bowel to the anterior peritoneal wall compatible with adhesions. There is mild edema in the adjacent mesentery. No fluid collection. There is extensive sigmoid and diffuse colonic diverticulosis with muscular hypertrophy. No active inflammatory changes. The appendix is normal. Vascular/Lymphatic: There is advanced aortoiliac atherosclerotic disease. The distal aorta measures 3.6 cm in diameter. 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 There is aneurysmal dilatation of the common iliac arteries measuring 2.4 cm on the left and 2.1 cm and the right. The origins of the celiac axis, SMA, IMA are patent. The SMV, splenic vein, and main portal vein are patent. No portal venous gas  identified. A 1 cm nodular density at the gastroesophageal junction or along the distal esophagus (series 2, image 19) likely represents a lymph node. Top-normal periportal and portacaval lymph nodes. No adenopathy. Reproductive: Mild enlargement of the prostate gland measuring up to 5 cm in diameter. Other: Midline vertical anterior abdominal wall incisional scar. No fluid collection. Musculoskeletal: Osteopenia with degenerative changes of the spine. Bilateral L5 pars  defects. L4-L5 and L5-S1 disc desiccation and vacuum phenomena. Endplate irregularity at L2-L3. No acute fracture. IMPRESSION: 1. Small-bowel obstruction with transition in the anterior lower abdomen at the ileo ileal anastomosis likely related to anastomotic stricture or adhesion. 2. Extensive colonic diverticulosis without active inflammatory changes. Normal appendix. 3. Irregular hepatic contour concerning for underlying cirrhosis. Correlation with clinical exam and liver function test recommended. 4.  Aortic Atherosclerosis (ICD10-I70.0). 5. 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 Prior abdominal aortic aneurysm repair. The distal aorta measures 3.6 cm in diameter similar to prior CT. Recommend followup by ultrasound in 2 years. This recommendation follows ACR consensus guidelines: White Paper of the ACR Incidental Findings Committee II on Vascular Findings. J Am Coll Radiol 2013; 10:789-794. Electronically Signed   By: Anner Crete M.D.   On: 03/18/2017 04:22    Review of Systems  Constitutional: Positive for malaise/fatigue. Negative for chills, diaphoresis, fever and weight loss.  HENT: Negative.   Eyes: Negative.   Respiratory: Negative.   Cardiovascular: Negative.   Gastrointestinal: Positive for abdominal pain and nausea. Negative for blood in stool, constipation, diarrhea, melena and vomiting.  Genitourinary: Negative.   Musculoskeletal: Negative.   Neurological: Negative.  Negative for weakness.  Endo/Heme/Allergies: Negative.   Psychiatric/Behavioral: Negative.    Blood pressure 125/84, pulse (!) 106, temperature 98 F (36.7 C), temperature source Oral, resp. rate 16, height 6' (1.829 m), weight 98.1 kg (216 lb 4.3 oz), SpO2 95 %. Physical Exam  Constitutional: He is oriented to person, place, and time. He appears well-developed and well-nourished. No distress.  NG in place 800 out with NG placement, and feels a little better.   Still distended and uncomfortable with this.  HENT:  Head: Normocephalic and atraumatic.  Mouth/Throat: Oropharynx is clear and moist. No oropharyngeal exudate.  NG in place and working  Eyes: Right eye exhibits no discharge. Left eye exhibits no discharge. No scleral icterus.  Pupils are equal  Neck: Normal range of motion. Neck supple. No JVD present. No tracheal deviation present. No thyromegaly present.  Cardiovascular: Regular rhythm, normal heart sounds and intact distal pulses.   No murmur heard. Still a little tachycardic  Respiratory: Effort normal and breath sounds normal. No respiratory distress. He has no wheezes. He has no rales. He exhibits no tenderness.  GI: He exhibits distension. He exhibits no mass. There is no rebound and no guarding.  Still distended and uncomfortable, BS hypoactive with high pitched BS  Musculoskeletal: He exhibits no edema or tenderness.  Lymphadenopathy:    He has no cervical adenopathy.  Neurological: He is alert and oriented to person, place, and time. No cranial nerve deficit.  Skin: Skin is warm and dry. No rash noted. He is not diaphoretic. No erythema. No pallor.  Psychiatric: He has a normal mood and affect. His behavior is normal. Judgment and thought content normal.    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 History of cirrhosis Hypothyroid Peripheral vascular disease - status post abdominal aortic aneurysm repair BPH/elevated PSA - followed by Dr. Alinda Money Diverticulosis FEN:  IV fluids/NPO ID:  No abx DVT:  SCD  Plan:  Agree with hydration, NG decompression, bowel rest.   We will get SB protocol started later this AM and follow with you.  I have ask them to give contrast at 10 AM.   Lynnox Girten 03/18/2017, 7:17 AM

## 2017-03-19 ENCOUNTER — Inpatient Hospital Stay (HOSPITAL_COMMUNITY): Payer: PPO

## 2017-03-19 LAB — CBC WITH DIFFERENTIAL/PLATELET
Basophils Absolute: 0 10*3/uL (ref 0.0–0.1)
Basophils Relative: 0 %
EOS PCT: 1 %
Eosinophils Absolute: 0.1 10*3/uL (ref 0.0–0.7)
HEMATOCRIT: 43.1 % (ref 39.0–52.0)
Hemoglobin: 14.8 g/dL (ref 13.0–17.0)
Lymphocytes Relative: 26 %
Lymphs Abs: 1.9 10*3/uL (ref 0.7–4.0)
MCH: 33.3 pg (ref 26.0–34.0)
MCHC: 34.3 g/dL (ref 30.0–36.0)
MCV: 96.9 fL (ref 78.0–100.0)
MONO ABS: 0.7 10*3/uL (ref 0.1–1.0)
Monocytes Relative: 10 %
NEUTROS ABS: 4.7 10*3/uL (ref 1.7–7.7)
Neutrophils Relative %: 63 %
PLATELETS: 127 10*3/uL — AB (ref 150–400)
RBC: 4.45 MIL/uL (ref 4.22–5.81)
RDW: 14.9 % (ref 11.5–15.5)
WBC: 7.3 10*3/uL (ref 4.0–10.5)

## 2017-03-19 LAB — COMPREHENSIVE METABOLIC PANEL
ALBUMIN: 3.1 g/dL — AB (ref 3.5–5.0)
ALT: 26 U/L (ref 17–63)
AST: 29 U/L (ref 15–41)
Alkaline Phosphatase: 103 U/L (ref 38–126)
Anion gap: 7 (ref 5–15)
BUN: 18 mg/dL (ref 6–20)
CHLORIDE: 107 mmol/L (ref 101–111)
CO2: 26 mmol/L (ref 22–32)
Calcium: 8.2 mg/dL — ABNORMAL LOW (ref 8.9–10.3)
Creatinine, Ser: 0.99 mg/dL (ref 0.61–1.24)
GFR calc Af Amer: >60 mL/min (ref >60)
GFR calc non Af Amer: >60 mL/min (ref >60)
GLUCOSE: 126 mg/dL — AB (ref 65–99)
POTASSIUM: 3.7 mmol/L (ref 3.5–5.1)
Sodium: 140 mmol/L (ref 135–145)
Total Bilirubin: 2.2 mg/dL — ABNORMAL HIGH (ref 0.3–1.2)
Total Protein: 6.4 g/dL — ABNORMAL LOW (ref 6.5–8.1)

## 2017-03-19 LAB — GLUCOSE, CAPILLARY
GLUCOSE-CAPILLARY: 90 mg/dL (ref 65–99)
Glucose-Capillary: 113 mg/dL — ABNORMAL HIGH (ref 65–99)
Glucose-Capillary: 100 mg/dL — ABNORMAL HIGH (ref 65–99)

## 2017-03-19 MED ORDER — PHENOL 1.4 % MT LIQD
1.0000 | OROMUCOSAL | Status: DC | PRN
  Filled 2017-03-19: qty 177

## 2017-03-19 NOTE — Progress Notes (Signed)
    CC:  abdominal pain and nausea  Subjective: NG in, multiple BM's.  Still feels a bit distended.  Objective: Vital signs in last 24 hours: Temp:  [98.3 F (36.8 C)-99.1 F (37.3 C)] 98.8 F (37.1 C) (07/20 0509) Pulse Rate:  [94-119] 102 (07/20 0509) Resp:  [17-20] 18 (07/20 0509) BP: (106-126)/(64-83) 126/64 (07/20 0509) SpO2:  [87 %-95 %] 90 % (07/20 0826) Weight:  [98.9 kg (218 lb 0.6 oz)] 98.9 kg (218 lb 0.6 oz) (07/20 0509) Last BM Date: 03/18/17 797 IV 200 urine recorded 900 NG Stool x 9 Afebrile, Tachycardic yesterday No labs No film this AM - pending Intake/Output from previous day: 07/19 0701 - 07/20 0700 In: 917.5 [I.V.:797.5; NG/GT:120] Out: 1100 [Urine:200; Emesis/NG output:900] Intake/Output this shift: No intake/output data recorded.  General appearance: alert, cooperative and no distress Resp: clear to auscultation bilaterally GI: soft, still feels somewhat distended, multiple loose and some solid stool.  + BS  Lab Results:   Recent Labs  03/18/17 0249 03/18/17 0304 03/19/17 0404  WBC 9.2  --  7.3  HGB 17.1* 17.0 14.8  HCT 46.5 50.0 43.1  PLT 128*  --  127*    BMET  Recent Labs  03/18/17 0249 03/18/17 0304 03/19/17 0404  NA 138 140 140  K 3.9 3.8 3.7  CL 104 104 107  CO2 24  --  26  GLUCOSE 134* 134* 126*  BUN 20 20 18   CREATININE 1.01 0.90 0.99  CALCIUM 9.5  --  8.2*   PT/INR No results for input(s): LABPROT, INR in the last 72 hours.   Recent Labs Lab 03/18/17 0249 03/19/17 0404  AST 41 29  ALT 31 26  ALKPHOS 140* 103  BILITOT 1.4* 2.2*  PROT 7.9 6.4*  ALBUMIN 3.9 3.1*     Lipase     Component Value Date/Time   LIPASE 29 03/18/2017 0249     Medications: . ketorolac  1 drop Left Eye QID  . levothyroxine  87.5 mcg Intravenous Daily  . metoprolol tartrate  5 mg Intravenous Q6H  . ofloxacin  1 drop Left Eye QID  . prednisoLONE acetate  1 drop Left Eye BID  . sodium chloride flush  10-40 mL Intracatheter  Q12H  . tiotropium  18 mcg Inhalation Daily   . 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 History of cirrhosis Hypothyroid Peripheral vascular disease - status post abdominal aortic aneurysm repair BPH/elevated PSA - followed by Dr. Alinda Money Diverticulosis FEN:  IV fluids/NPO ID:  No abx DVT:  SCD - Can have heparin or Lovenox from our standpoint.    Plan:  Film is pending, I am going to clamp NG for now, and await films.  Hope to pull and advance diet today.  Home soon if films are as I expect.  If he does OK with clamping and clears we can restart PO meds.   LOS: 1 day    Biagio Snelson 03/19/2017 [id number]

## 2017-03-19 NOTE — Plan of Care (Signed)
Problem: Nutrition: Goal: Adequate nutrition will be maintained Outcome: Not Progressing Pt NPO

## 2017-03-19 NOTE — Progress Notes (Addendum)
PROGRESS NOTE    Joseph [name]  OAC:[id number] DOB: [birth year] DOA: 03/18/2017 PCP: [name], MD     Brief Narrative:  Joseph Cappella [name] a 76 y.o.malewith history of paroxysmal atrial fibrillation, hypertension, COPD, diastolic dysfunction who was admitted in February of this year for small bowel obstruction managed conservatively presents to the ER with complains of abdominal pain. Pain was mostly in the lower abdomen which became more diffuse and started radiating to the back. Denies any vomiting but did have some nausea. CT of the abdomen shows bowel obstruction with transition point. General surgery was consulted.   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 Consistent with a partial small bowel obstruction without significant change from the previous day's radiographs. No free air. -General surgery following  Paroxysmal atrial fibrillation  -Not on anticoagulation due to hx cirrhosis and risk of bleed. Holding baby aspirin while NPO  -IV metoprolol, transition to oral lopressor 25mg  BID once taking orals   COPD -Not acute exacerbation -Continue spiriva   Hypothyroidism -Continue synthroid  Chronic diastolic heart failure -Takes lasix 20mg  daily. Stop IVF today.  -Volume status stable  Hx lung cancer in remission   DVT prophylaxis: SCDs Code Status: Full Family Communication: Wife at bedside Disposition Plan: pending improvement   Consultants:   General Surgery  Procedures:   None  Antimicrobials:  Anti-infectives    None       Subjective: Doing well, had 7-8 loose BM  overnight, feels mild abdominal discomfort, no nausea or vomiting   Objective: Vitals:   03/18/17 2349 03/19/17 0509 03/19/17 0824 03/19/17 0826  BP: 118/71 126/64    Pulse: (!) 119 (!) 102    Resp:  18    Temp: 99.1 F (37.3 C) 98.8 F (37.1 C)    TempSrc: Oral Oral    SpO2: 92% 92% (!) 87% 90%  Weight:  98.9 kg (218 lb 0.6 oz)    Height:        Intake/Output Summary (Last 24 hours) at 03/19/17 1131 Last data filed at 03/19/17 1000  Gross per 24 hour  Intake           1727.5 ml  Output              300 ml  Net           1427.5 ml   Filed Weights   03/18/17 0553 03/19/17 0509  Weight: 98.1 kg (216 lb 4.3 oz) 98.9 kg (218 lb 0.6 oz)    Examination:  General exam: Appears calm and comfortable  Respiratory system: Clear to auscultation. Respiratory effort normal. Cardiovascular system: S1 & S2 heard, RRR. No JVD, murmurs, rubs, gallops or clicks. No pedal edema. Gastrointestinal system: Abdomen is nondistended, soft and mildly TTP mid-abdomen. No organomegaly or masses felt. +bowel sounds heard. Central nervous system: Alert and oriented. No focal neurological deficits. Extremities: Symmetric 5 x 5 power. Skin: No rashes, lesions or ulcers Psychiatry: Judgement and insight appear normal. Mood & affect appropriate.   Data Reviewed: I have personally reviewed following labs and imaging studies  CBC:  Recent Labs Lab 03/18/17 0249 03/18/17 0304 03/19/17  0404  WBC 9.2  --  7.3  NEUTROABS  --   --  4.7  HGB 17.1* 17.0 14.8  HCT 46.5 50.0 43.1  MCV 93.9  --  96.9  PLT 128*  --  414*   Basic Metabolic Panel:  Recent Labs Lab 03/18/17 0249 03/18/17 0304 03/19/17 0404  NA 138 140 140  K 3.9 3.8 3.7  CL 104 104 107  CO2 24  --  26  GLUCOSE 134* 134* 126*  BUN 20 20 18   CREATININE 1.01 0.90 0.99  CALCIUM 9.5  --  8.2*   GFR: Estimated Creatinine Clearance: 79.7 mL/min (by C-G formula based on SCr of 0.99 mg/dL). Liver Function Tests:  Recent Labs Lab  03/18/17 0249 03/19/17 0404  AST 41 29  ALT 31 26  ALKPHOS 140* 103  BILITOT 1.4* 2.2*  PROT 7.9 6.4*  ALBUMIN 3.9 3.1*    Recent Labs Lab 03/18/17 0249  LIPASE 29   No results for input(s): AMMONIA in the last 168 hours. Coagulation Profile: No results for input(s): INR, PROTIME in the last 168 hours. Cardiac Enzymes: No results for input(s): CKTOTAL, CKMB, CKMBINDEX, TROPONINI in the last 168 hours. BNP (last 3 results) No results for input(s): PROBNP in the last 8760 hours. HbA1C: No results for input(s): HGBA1C in the last 72 hours. CBG:  Recent Labs Lab 03/18/17 1301 03/18/17 2342 03/19/17 0826  GLUCAP 128* 112* 113*   Lipid Profile: No results for input(s): CHOL, HDL, LDLCALC, TRIG, CHOLHDL, LDLDIRECT in the last 72 hours. Thyroid Function Tests: No results for input(s): TSH, T4TOTAL, FREET4, T3FREE, THYROIDAB in the last 72 hours. Anemia Panel: No results for input(s): VITAMINB12, FOLATE, FERRITIN, TIBC, IRON, RETICCTPCT in the last 72 hours. Sepsis Labs: No results for input(s): PROCALCITON, LATICACIDVEN in the last 168 hours.  No results found for this or any previous visit (from the past 240 hour(s)).     Radiology Studies: Ct Abdomen Pelvis W Contrast  Result Date: 03/18/2017 CLINICAL DATA:  76 year old male with abdominal pain. History of small-bowel obstruction. EXAM: CT ABDOMEN AND PELVIS WITH CONTRAST TECHNIQUE: Multidetector CT imaging of the abdomen and pelvis was performed using the standard protocol following bolus administration of intravenous contrast. CONTRAST:  100 cc Isovue-300 COMPARISON:  CT of the abdomen pelvis dated 10/20/2016 FINDINGS: Lower chest: Emphysematous changes of the lungs with bibasilar atelectasis/ scarring. Multi vessel coronary vascular calcification. No intra-abdominal free air or free fluid. Hepatobiliary: Mild irregularity of the hepatic contour concerning for early cirrhosis. Correlation with clinical exam and liver  function test recommended. Small amount of stone or sludge versus polyp within the gallbladder. No pericholecystic fluid. Pancreas: Unremarkable. No pancreatic ductal dilatation or surrounding inflammatory changes. Spleen: Normal in size without focal abnormality. Adrenals/Urinary Tract: The adrenal glands are unremarkable. The kidneys, visualized ureters, and urinary bladder are unremarkable. Stomach/Bowel: The stomach is distended. No evidence of gastric outlet obstruction. There is postsurgical changes of bowel with ileo ileal anastomosis in the anterior lower abdomen. There is dilatation of the loops of small bowel proximal to the anastomosis compatible with obstruction at the level of anastomosis likely related to anastomotic stricture or adhesions. There is abutment of the anastomosis segment of small bowel to the anterior peritoneal wall compatible with adhesions. There is mild edema in the adjacent mesentery. No fluid collection. There is extensive sigmoid and diffuse colonic diverticulosis with muscular hypertrophy. No active inflammatory changes. The appendix is normal. Vascular/Lymphatic: There is advanced aortoiliac atherosclerotic disease. The distal aorta measures  3.6 cm in diameter. 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 There is aneurysmal dilatation of the common iliac arteries measuring 2.4 cm on the left and 2.1 cm and the right. The origins of the celiac axis, SMA, IMA are patent. The SMV, splenic vein, and main portal vein are patent. No portal venous gas identified. A 1 cm nodular density at the gastroesophageal junction or along the distal esophagus (series 2, image 19) likely represents a lymph node. Top-normal periportal and portacaval lymph nodes. No adenopathy. Reproductive: Mild enlargement of the prostate gland measuring up to 5 cm in diameter. Other: Midline vertical anterior abdominal wall incisional scar. No fluid collection. Musculoskeletal: Osteopenia with  degenerative changes of the spine. Bilateral L5 pars defects. L4-L5 and L5-S1 disc desiccation and vacuum phenomena. Endplate irregularity at L2-L3. No acute fracture. IMPRESSION: 1. Small-bowel obstruction with transition in the anterior lower abdomen at the ileo ileal anastomosis likely related to anastomotic stricture or adhesion. 2. Extensive colonic diverticulosis without active inflammatory changes. Normal appendix. 3. Irregular hepatic contour concerning for underlying cirrhosis. Correlation with clinical exam and liver function test recommended. 4.  Aortic Atherosclerosis (ICD10-I70.0). 5. 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 Prior abdominal aortic aneurysm repair. The distal aorta measures 3.6 cm in diameter similar to prior CT. Recommend followup by ultrasound in 2 years. This recommendation follows ACR consensus guidelines: White Paper of the ACR Incidental Findings Committee II on Vascular Findings. J Am Coll Radiol 2013; 10:789-794. Electronically Signed   By: Anner Crete M.D.   On: 03/18/2017 04:22   Dg Abd 2 Views  Result Date: 03/19/2017 CLINICAL DATA:  Abdominal cramps. Nasogastric tube placed last night. Some symptomatic improvement. EXAM: ABDOMEN - 2 VIEW COMPARISON:  03/18/2017 FINDINGS: There is persistent small bowel dilation with several air-fluid levels on the erect view. Air-fluid levels are also noted in a normal caliber colon. Some contrast is seen within the colon. There is no free air. Nasogastric tube passes below the diaphragm into the proximal stomach. IMPRESSION: 1. Persistent small bowel dilation with air-fluid levels. This is consistent with a partial small bowel obstruction without significant change from the previous day's radiographs. No free air. Electronically Signed   By: Lajean Manes M.D.   On: 03/19/2017 10:29   Dg Abd Portable 1v-small Bowel Obstruction Protocol-initial, 8 Hr Delay  Result Date:  03/19/2017 CLINICAL DATA:  Small-bowel obstruction. EXAM: PORTABLE ABDOMEN - 1 VIEW COMPARISON:  03/18/2017.  CT 03/18/2017. FINDINGS: NG tube noted with tip projected over the stomach. Contrast noted in the stomach. Contrast in the bladder from prior CT. Persistent dilated loops of small bowel. Findings consistent with persistent small-bowel obstruction. Some air is noted within the colon. No free air. Degenerative changes lumbar spine . IMPRESSION: 1. NG tube noted in the stomach.  Contrast is noted in the stomach. 2. Persistent dilated loops of small bowel noted consist with persistent small-bowel obstruction. Electronically Signed   By: Marcello Moores  Register   On: 03/19/2017 06:34   Dg Abd Portable 1v-small Bowel Protocol-position Verification  Result Date: 03/18/2017 CLINICAL DATA:  76 year old male with a history of nasogastric tube placement EXAM: PORTABLE ABDOMEN - 1 VIEW COMPARISON:  CT 03/18/2017, plain film 10/24/2016 FINDINGS: Gastric tube projects over the mediastinum and the upper abdomen with the side-port appearing beyond the GE junction. Incidental imaging of left chest wall subclavian port catheter and overlying EKG leads. Gas within stomach and small bowel. Incidental imaging of low lung volumes and left  basilar opacity, potentially atelectasis. IMPRESSION: Gastric tube terminates within the stomach. Electronically Signed   By: Corrie Mckusick D.O.   On: 03/18/2017 09:15      Scheduled Meds: . ketorolac  1 drop Left Eye QID  . levothyroxine  87.5 mcg Intravenous Daily  . metoprolol tartrate  5 mg Intravenous Q6H  . ofloxacin  1 drop Left Eye QID  . prednisoLONE acetate  1 drop Left Eye BID  . sodium chloride flush  10-40 mL Intracatheter Q12H  . tiotropium  18 mcg Inhalation Daily   Continuous Infusions:    LOS: 1 day    Time spent: 30 minutes   Dessa Phi, DO [facility] [email or web address] Password [facility] 03/19/2017, 11:31 AM

## 2017-03-19 NOTE — Progress Notes (Signed)
03/19/17  1505  Checked NGT residual 214mL output

## 2017-03-19 NOTE — Progress Notes (Signed)
03/19/17  1600 Flushed NGT with 14mL of air.

## 2017-03-19 NOTE — Care Management Note (Signed)
Case Management Note  Patient Details  Name: Joseph [name] MRN: [id number] Date of Birth: June 29, 1941  Subjective/Objective:  76 y/o m admitted w/SBO. SX following. NG-clamping, await xray results. From home.                  Action/Plan:d/c home.   Expected Discharge Date:                  Expected Discharge Plan:  Home/Self Care  In-House Referral:     Discharge planning Services  CM Consult  Post Acute Care Choice:    Choice offered to:     DME Arranged:    DME Agency:     HH Arranged:    HH Agency:     Status of Service:  In process, will continue to follow  If discussed at Long Length of Stay Meetings, dates discussed:    Additional Comments:  Dessa Phi, RN 03/19/2017, 10:34 AM

## 2017-03-20 ENCOUNTER — Inpatient Hospital Stay (HOSPITAL_COMMUNITY): Payer: PPO

## 2017-03-20 LAB — COMPREHENSIVE METABOLIC PANEL
ALT: 29 U/L (ref 17–63)
ANION GAP: 7 (ref 5–15)
AST: 34 U/L (ref 15–41)
Albumin: 3.2 g/dL — ABNORMAL LOW (ref 3.5–5.0)
Alkaline Phosphatase: 95 U/L (ref 38–126)
BUN: 14 mg/dL (ref 6–20)
CALCIUM: 8.2 mg/dL — AB (ref 8.9–10.3)
CHLORIDE: 105 mmol/L (ref 101–111)
CO2: 26 mmol/L (ref 22–32)
Creatinine, Ser: 0.92 mg/dL (ref 0.61–1.24)
Glucose, Bld: 98 mg/dL (ref 65–99)
Potassium: 3.8 mmol/L (ref 3.5–5.1)
SODIUM: 138 mmol/L (ref 135–145)
Total Bilirubin: 3 mg/dL — ABNORMAL HIGH (ref 0.3–1.2)
Total Protein: 6.2 g/dL — ABNORMAL LOW (ref 6.5–8.1)

## 2017-03-20 LAB — GLUCOSE, CAPILLARY
GLUCOSE-CAPILLARY: 87 mg/dL (ref 65–99)
GLUCOSE-CAPILLARY: 87 mg/dL (ref 65–99)
GLUCOSE-CAPILLARY: 92 mg/dL (ref 65–99)
GLUCOSE-CAPILLARY: 97 mg/dL (ref 65–99)
Glucose-Capillary: 90 mg/dL (ref 65–99)

## 2017-03-20 NOTE — Progress Notes (Signed)
PROGRESS NOTE    Joseph [name]  TDV:[id number] DOB: 20-Jun-1941 DOA: 03/18/2017 PCP: [name], MD     Brief Narrative:  Joseph Colasanti [name] a 76 y.o.malewith history of paroxysmal atrial fibrillation, hypertension, COPD, diastolic dysfunction who was admitted in February of this year for small bowel obstruction managed conservatively presents to the ER with complains of abdominal pain. Pain was mostly in the lower abdomen which became more diffuse and started radiating to the back. Denies any vomiting but did have some nausea. CT of the abdomen shows bowel obstruction with transition point. General surgery was consulted.   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  -General surgery following  Paroxysmal atrial fibrillation  -Not on anticoagulation due to hx cirrhosis and risk of bleed. Holding baby aspirin while NPO  -IV metoprolol, transition to oral lopressor 25mg  BID once taking orals adequately   COPD -Not acute exacerbation -Continue spiriva   Hypothyroidism -Continue synthroid  Chronic diastolic heart failure -Takes lasix 20mg  daily. Stop IVF.  -Volume status stable  Hx lung cancer in remission   DVT prophylaxis: SCDs Code Status: Full Family Communication: Wife at bedside Disposition Plan: pending improvement   Consultants:   General Surgery  Procedures:   None  Antimicrobials:  Anti-infectives    None       Subjective: Doing well, had about 3 BM since my last exam, states he has intermittent crampy  abdominal pain that is followed by flatus. Tolerating clear liquid diet   Objective: Vitals:   03/19/17 1827 03/19/17 2116 03/20/17 0041 03/20/17 0530  BP: 116/78 110/64 105/63 123/65  Pulse: (!) 109 98 96 97  Resp: 20 18  18   Temp:  99.3 F (37.4 C)  98.2 F (36.8 C)  TempSrc:  Oral  Oral  SpO2: 92% 91%  92%  Weight:      Height:        Intake/Output Summary (Last 24 hours) at 03/20/17 0926 Last data filed at 03/19/17 1800  Gross per 24 hour  Intake             1420 ml  Output              200 ml  Net             1220 ml   Filed Weights   03/18/17 0553 03/19/17 0509  Weight: 98.1 kg (216 lb 4.3 oz) 98.9 kg (218 lb 0.6 oz)    Examination:  General exam: Appears calm and comfortable  Respiratory system: Clear to auscultation. Respiratory effort normal. Cardiovascular system: S1 & S2 heard, RRR. No JVD, murmurs, rubs, gallops or clicks. No pedal edema. Gastrointestinal system: Abdomen is nondistended, soft. No organomegaly or masses felt. +bowel sounds heard. Central nervous system: Alert and oriented. No focal neurological deficits. Extremities: Symmetric 5 x 5 power. Skin: No rashes, lesions or ulcers Psychiatry: Judgement and insight appear normal. Mood & affect appropriate.   Data Reviewed: I have personally reviewed following labs and imaging studies  CBC:  Recent Labs Lab 03/18/17 0249 03/18/17 0304 03/19/17  0404  WBC 9.2  --  7.3  NEUTROABS  --   --  4.7  HGB 17.1* 17.0 14.8  HCT 46.5 50.0 43.1  MCV 93.9  --  96.9  PLT 128*  --  892*   Basic Metabolic Panel:  Recent Labs Lab 03/18/17 0249 03/18/17 0304 03/19/17 0404 03/20/17 0320  NA 138 140 140 138  K 3.9 3.8 3.7 3.8  CL 104 104 107 105  CO2 24  --  26 26  GLUCOSE 134* 134* 126* 98  BUN 20 20 18 14   CREATININE 1.01 0.90 0.99 0.92  CALCIUM 9.5  --  8.2* 8.2*   GFR: Estimated Creatinine Clearance: 85.8 mL/min (by C-G formula based on SCr of 0.92 mg/dL). Liver Function Tests:  Recent  Labs Lab 03/18/17 0249 03/19/17 0404 03/20/17 0320  AST 41 29 34  ALT 31 26 29   ALKPHOS 140* 103 95  BILITOT 1.4* 2.2* 3.0*  PROT 7.9 6.4* 6.2*  ALBUMIN 3.9 3.1* 3.2*    Recent Labs Lab 03/18/17 0249  LIPASE 29   No results for input(s): AMMONIA in the last 168 hours. Coagulation Profile: No results for input(s): INR, PROTIME in the last 168 hours. Cardiac Enzymes: No results for input(s): CKTOTAL, CKMB, CKMBINDEX, TROPONINI in the last 168 hours. BNP (last 3 results) No results for input(s): PROBNP in the last 8760 hours. HbA1C: No results for input(s): HGBA1C in the last 72 hours. CBG:  Recent Labs Lab 03/19/17 0826 03/19/17 1223 03/19/17 1742 03/20/17 0034 03/20/17 0654  GLUCAP 113* 100* 90 87 97   Lipid Profile: No results for input(s): CHOL, HDL, LDLCALC, TRIG, CHOLHDL, LDLDIRECT in the last 72 hours. Thyroid Function Tests: No results for input(s): TSH, T4TOTAL, FREET4, T3FREE, THYROIDAB in the last 72 hours. Anemia Panel: No results for input(s): VITAMINB12, FOLATE, FERRITIN, TIBC, IRON, RETICCTPCT in the last 72 hours. Sepsis Labs: No results for input(s): PROCALCITON, LATICACIDVEN in the last 168 hours.  No results found for this or any previous visit (from the past 240 hour(s)).     Radiology Studies: Dg Abd 1 View  Result Date: 03/20/2017 CLINICAL DATA:  Small bowel obstruction.  History of lung cancer. EXAM: ABDOMEN - 1 VIEW COMPARISON:  03/19/2017 FINDINGS: Nasogastric tube has tip over the gastric fundus with side port at the gastroesophageal junction unchanged. Bowel gas pattern demonstrates air in contrast throughout the colon with significant colonic diverticulosis. There are a few persistent air-filled prominent bowel loops in the mid and right abdomen likely small bowel and unchanged. Remainder of the exam is unchanged. IMPRESSION: A few stable air-filled dilated small bowel loops over the mid and right abdomen with air and contrast throughout  the colon. NG tube unchanged with side port at the level of the gastroesophageal junction. Electronically Signed   By: Marin Olp M.D.   On: 03/20/2017 07:23   Dg Abd 2 Views  Result Date: 03/19/2017 CLINICAL DATA:  Abdominal cramps. Nasogastric tube placed last night. Some symptomatic improvement. EXAM: ABDOMEN - 2 VIEW COMPARISON:  03/18/2017 FINDINGS: There is persistent small bowel dilation with several air-fluid levels on the erect view. Air-fluid levels are also noted in a normal caliber colon. Some contrast is seen within the colon. There is no free air. Nasogastric tube passes below the diaphragm into the proximal stomach. IMPRESSION: 1. Persistent small bowel dilation with air-fluid levels. This is consistent with a partial small bowel obstruction without significant change from the previous day's radiographs. No free air. Electronically Signed  By: Lajean Manes M.D.   On: 03/19/2017 10:29   Dg Abd Portable 1v-small Bowel Obstruction Protocol-initial, 8 Hr Delay  Result Date: 03/19/2017 CLINICAL DATA:  Small-bowel obstruction. EXAM: PORTABLE ABDOMEN - 1 VIEW COMPARISON:  03/18/2017.  CT 03/18/2017. FINDINGS: NG tube noted with tip projected over the stomach. Contrast noted in the stomach. Contrast in the bladder from prior CT. Persistent dilated loops of small bowel. Findings consistent with persistent small-bowel obstruction. Some air is noted within the colon. No free air. Degenerative changes lumbar spine . IMPRESSION: 1. NG tube noted in the stomach.  Contrast is noted in the stomach. 2. Persistent dilated loops of small bowel noted consist with persistent small-bowel obstruction. Electronically Signed   By: Marcello Moores  Register   On: 03/19/2017 06:34      Scheduled Meds: . ketorolac  1 drop Left Eye QID  . levothyroxine  87.5 mcg Intravenous Daily  . metoprolol tartrate  5 mg Intravenous Q6H  . ofloxacin  1 drop Left Eye QID  . prednisoLONE acetate  1 drop Left Eye BID  . sodium  chloride flush  10-40 mL Intracatheter Q12H  . tiotropium  18 mcg Inhalation Daily   Continuous Infusions:    LOS: 2 days    Time spent: 30 minutes   Dessa Phi, DO [facility] [email or web address] Password [facility] 03/20/2017, 9:26 AM

## 2017-03-20 NOTE — Progress Notes (Signed)
Ambulated pt approximately 100 ft down hall with no  O2. Upon return to room O2 sat off oxygen was 84%. O2 sat rebounded quickly to 92% on RA. O2 resumed at 2 l/min and O2 sat got to 95-96%. Eulas Post, RN

## 2017-03-21 ENCOUNTER — Inpatient Hospital Stay (HOSPITAL_COMMUNITY): Payer: PPO

## 2017-03-21 LAB — GLUCOSE, CAPILLARY
GLUCOSE-CAPILLARY: 77 mg/dL (ref 65–99)
Glucose-Capillary: 102 mg/dL — ABNORMAL HIGH (ref 65–99)

## 2017-03-21 MED ORDER — HEPARIN SOD (PORK) LOCK FLUSH 100 UNIT/ML IV SOLN
500.0000 [IU] | INTRAVENOUS | Status: DC | PRN
  Administered 2017-03-21: 500 [IU]

## 2017-03-21 MED ORDER — HEPARIN SOD (PORK) LOCK FLUSH 100 UNIT/ML IV SOLN: 500.0000 [IU] | INTRAVENOUS | Status: DC

## 2017-03-21 NOTE — Progress Notes (Signed)
Pt for d/c home with wife. IV team de accessed porta cath. Tolerated both breakfast and lunch without any GI compromise. BM with formed stool prior to d/c. No changes in initial am assessment. Condition stable for d/c home

## 2017-03-21 NOTE — Discharge Summary (Signed)
Physician Discharge Summary  Joseph [name] TOI:[id number] DOB: 1941/08/12 DOA: 03/18/2017  PCP: [name], MD  Admit date: 03/18/2017 Discharge date: 03/21/2017  Admitted From: Home Disposition:  Home  Recommendations for Outpatient Follow-up:  1. Follow up with PCP in 1 week  Discharge Condition: Stable CODE STATUS: Full  Diet recommendation: Soft diet, advance slowly as tolerated   Brief/Interim Summary: Joseph Farruggia [name] a 76 y.o.malewith history of paroxysmal atrial fibrillation, hypertension, COPD, diastolic dysfunction who was admitted in February of this year for small bowel obstruction managed conservatively presents to the ER with complains of abdominal pain. Pain was mostly in the lower abdomen which became more diffuse and started radiating to the back. Denies any vomiting but did have some nausea. CT of the abdomen shows bowel obstruction with transition point. General surgery was consulted. Patient continued to improve with conservative measures and did not require surgical intervention.   SBO -Clinically improving, now NGT removed and patient tolerating soft diet, having bowel movements, no nausea.   Acute hypoxemic respiratory failure -Patient required [place] O2 while in hospital, CXR which showed stable scarring of the right lung apex and left lung base. Wean O2. He states that he does not want O2 at home   Paroxysmal atrial fibrillation  -Not on anticoagulation due to hx cirrhosis and risk of bleed. Resume aspirin  -Resume oral lopressor   COPD -Not acute exacerbation -Continue spiriva   Hypothyroidism -Continue synthroid  Chronic diastolic heart failure -Takes lasix 20mg  daily -Volume status stable  Hx lung cancer in remission    Discharge Instructions  Discharge Instructions    Call MD for:  difficulty breathing, headache or visual disturbances    Complete by:  As directed    Call MD for:  extreme fatigue    Complete by:  As  directed    Call MD for:  persistant dizziness or light-headedness    Complete by:  As directed    Call MD for:  persistant nausea and vomiting    Complete by:  As directed    Call MD for:  severe uncontrolled pain    Complete by:  As directed    Call MD for:  temperature >100.4    Complete by:  As directed    DIET SOFT    Complete by:  As directed    Discharge instructions    Complete by:  As directed    You were cared for by a hospitalist during your hospital stay. If you have any questions about your discharge medications or the care you received while you were in the hospital after you are discharged, you can call the unit and asked to speak with the hospitalist on call if the hospitalist that took care of you is not available. Once you are discharged, your primary care physician will handle any further medical issues. Please note that NO REFILLS for any discharge medications will be authorized once you are discharged, as it is imperative that you return to your primary care physician (or establish a relationship with a primary care physician if you do not have one) for your aftercare needs so that they can reassess your need for medications and monitor your lab values.   Increase activity slowly    Complete by:  As directed      Allergies as of 03/21/2017   No Known Allergies     Medication List    STOP taking these medications   acetaminophen 325 MG tablet Commonly known as:  TYLENOL   HYDROcodone-acetaminophen 5-325 MG tablet Commonly known as:  NORCO/VICODIN   polyethylene glycol packet Commonly known as:  MIRALAX / GLYCOLAX     TAKE these medications   acidophilus Caps capsule Take 1 capsule by mouth daily.   albuterol 108 (90 Base) MCG/ACT inhaler Commonly known as:  PROVENTIL HFA;VENTOLIN HFA Inhale 1-2 puffs into the lungs every 6 (six) hours as needed for wheezing or shortness of breath.   aspirin EC 81 MG tablet Take 1 tablet (81 mg total) by mouth daily.    furosemide 20 MG tablet Commonly known as:  LASIX TAKE 1 TABLET (20 MG TOTAL) BY MOUTH DAILY.   ketorolac 0.5 % ophthalmic solution Commonly known as:  ACULAR Place 1 drop into the left eye 4 (four) times daily.   L-ARGININE PO Take 1 tablet by mouth 2 (two) times daily.   metoprolol tartrate 25 MG tablet Commonly known as:  LOPRESSOR TAKE 1 TABLET (25 MG TOTAL) BY MOUTH 2 (TWO) TIMES DAILY.   multivitamin with minerals Tabs tablet Take 1 tablet by mouth daily.   ofloxacin 0.3 % ophthalmic solution Commonly known as:  OCUFLOX Place 1 drop into the left eye 4 (four) times daily.   OVER THE COUNTER MEDICATION Take 2 tablets by mouth 2 (two) times daily. Medication:  Lung Support   OVER THE COUNTER MEDICATION Take 1 tablet by mouth daily.   prednisoLONE acetate 1 % ophthalmic suspension Commonly known as:  PRED FORTE Place 1 drop into the left eye 2 (two) times daily.   PROSTATE PO Take 1 tablet by mouth 2 (two) times daily.   SYNTHROID 175 MCG tablet Generic drug:  levothyroxine Take 175 mcg by mouth daily before breakfast.   tiotropium 18 MCG inhalation capsule Commonly known as:  SPIRIVA Place 18 mcg into inhaler and inhale daily.   vitamin B-12 1000 MCG tablet Commonly known as:  CYANOCOBALAMIN Take 1,000 mcg by mouth daily.      Follow-up Information    [name], MD. Schedule an appointment as soon as possible for a visit in 1 week(s).   Specialty:  Internal Medicine Contact information: [address] [place] [address] [phone]          No Known Allergies  Consultations:  General Surgery    Procedures/Studies: Dg Chest 2 View  Result Date: 03/21/2017 CLINICAL DATA:  Respiratory failure.  History of lung cancer EXAM: CHEST  2 VIEW COMPARISON:  01/06/2017 FINDINGS: Port-A-Cath tip:  Brachiocephalic confluence. Atherosclerotic calcification of the aortic arch. Severe emphysema. Scarring at the left lung base. Scarring medially in  the right upper lobe. Heart size within normal limits. Thoracic spondylosis. No pleural effusion. Chronic interstitial accentuation. IMPRESSION: 1. Stable scarring of the right lung apex and left lung base. 2. Aortic Atherosclerosis (ICD10-I70.0) and Emphysema (ICD10-J43.9). Electronically Signed   By: Van Clines M.D.   On: 03/21/2017 10:51   Dg Abd 1 View  Result Date: 03/20/2017 CLINICAL DATA:  Small bowel obstruction.  History of lung cancer. EXAM: ABDOMEN - 1 VIEW COMPARISON:  03/19/2017 FINDINGS: Nasogastric tube has tip over the gastric fundus with side port at the gastroesophageal junction unchanged. Bowel gas pattern demonstrates air in contrast throughout the colon with significant colonic diverticulosis. There are a few persistent air-filled prominent bowel loops in the mid and right abdomen likely small bowel and unchanged. Remainder of the exam is unchanged. IMPRESSION: A few stable air-filled dilated small bowel loops over the mid and right abdomen with air and  contrast throughout the colon. NG tube unchanged with side port at the level of the gastroesophageal junction. Electronically Signed   By: Marin Olp M.D.   On: 03/20/2017 07:23   Ct Abdomen Pelvis W Contrast  Result Date: 03/18/2017 CLINICAL DATA:  76 year old male with abdominal pain. History of small-bowel obstruction. EXAM: CT ABDOMEN AND PELVIS WITH CONTRAST TECHNIQUE: Multidetector CT imaging of the abdomen and pelvis was performed using the standard protocol following bolus administration of intravenous contrast. CONTRAST:  100 cc Isovue-300 COMPARISON:  CT of the abdomen pelvis dated 10/20/2016 FINDINGS: Lower chest: Emphysematous changes of the lungs with bibasilar atelectasis/ scarring. Multi vessel coronary vascular calcification. No intra-abdominal free air or free fluid. Hepatobiliary: Mild irregularity of the hepatic contour concerning for early cirrhosis. Correlation with clinical exam and liver function test  recommended. Small amount of stone or sludge versus polyp within the gallbladder. No pericholecystic fluid. Pancreas: Unremarkable. No pancreatic ductal dilatation or surrounding inflammatory changes. Spleen: Normal in size without focal abnormality. Adrenals/Urinary Tract: The adrenal glands are unremarkable. The kidneys, visualized ureters, and urinary bladder are unremarkable. Stomach/Bowel: The stomach is distended. No evidence of gastric outlet obstruction. There is postsurgical changes of bowel with ileo ileal anastomosis in the anterior lower abdomen. There is dilatation of the loops of small bowel proximal to the anastomosis compatible with obstruction at the level of anastomosis likely related to anastomotic stricture or adhesions. There is abutment of the anastomosis segment of small bowel to the anterior peritoneal wall compatible with adhesions. There is mild edema in the adjacent mesentery. No fluid collection. There is extensive sigmoid and diffuse colonic diverticulosis with muscular hypertrophy. No active inflammatory changes. The appendix is normal. Vascular/Lymphatic: There is advanced aortoiliac atherosclerotic disease. The distal aorta measures 3.6 cm in diameter. 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 There is aneurysmal dilatation of the common iliac arteries measuring 2.4 cm on the left and 2.1 cm and the right. The origins of the celiac axis, SMA, IMA are patent. The SMV, splenic vein, and main portal vein are patent. No portal venous gas identified. A 1 cm nodular density at the gastroesophageal junction or along the distal esophagus (series 2, image 19) likely represents a lymph node. Top-normal periportal and portacaval lymph nodes. No adenopathy. Reproductive: Mild enlargement of the prostate gland measuring up to 5 cm in diameter. Other: Midline vertical anterior abdominal wall incisional scar. No fluid collection. Musculoskeletal: Osteopenia with degenerative changes  of the spine. Bilateral L5 pars defects. L4-L5 and L5-S1 disc desiccation and vacuum phenomena. Endplate irregularity at L2-L3. No acute fracture. IMPRESSION: 1. Small-bowel obstruction with transition in the anterior lower abdomen at the ileo ileal anastomosis likely related to anastomotic stricture or adhesion. 2. Extensive colonic diverticulosis without active inflammatory changes. Normal appendix. 3. Irregular hepatic contour concerning for underlying cirrhosis. Correlation with clinical exam and liver function test recommended. 4.  Aortic Atherosclerosis (ICD10-I70.0). 5. 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 Prior abdominal aortic aneurysm repair. The distal aorta measures 3.6 cm in diameter similar to prior CT. Recommend followup by ultrasound in 2 years. This recommendation follows ACR consensus guidelines: White Paper of the ACR Incidental Findings Committee II on Vascular Findings. J Am Coll Radiol 2013; 10:789-794. Electronically Signed   By: Anner Crete M.D.   On: 03/18/2017 04:22   Dg Abd 2 Views  Result Date: 03/19/2017 CLINICAL DATA:  Abdominal cramps. Nasogastric tube placed last night. Some symptomatic improvement. EXAM: ABDOMEN - 2 VIEW  COMPARISON:  03/18/2017 FINDINGS: There is persistent small bowel dilation with several air-fluid levels on the erect view. Air-fluid levels are also noted in a normal caliber colon. Some contrast is seen within the colon. There is no free air. Nasogastric tube passes below the diaphragm into the proximal stomach. IMPRESSION: 1. Persistent small bowel dilation with air-fluid levels. This is consistent with a partial small bowel obstruction without significant change from the previous day's radiographs. No free air. Electronically Signed   By: Lajean Manes M.D.   On: 03/19/2017 10:29   Dg Abd Portable 1v-small Bowel Obstruction Protocol-initial, 8 Hr Delay  Result Date: 03/19/2017 CLINICAL DATA:   Small-bowel obstruction. EXAM: PORTABLE ABDOMEN - 1 VIEW COMPARISON:  03/18/2017.  CT 03/18/2017. FINDINGS: NG tube noted with tip projected over the stomach. Contrast noted in the stomach. Contrast in the bladder from prior CT. Persistent dilated loops of small bowel. Findings consistent with persistent small-bowel obstruction. Some air is noted within the colon. No free air. Degenerative changes lumbar spine . IMPRESSION: 1. NG tube noted in the stomach.  Contrast is noted in the stomach. 2. Persistent dilated loops of small bowel noted consist with persistent small-bowel obstruction. Electronically Signed   By: Marcello Moores  Register   On: 03/19/2017 06:34   Dg Abd Portable 1v-small Bowel Protocol-position Verification  Result Date: 03/18/2017 CLINICAL DATA:  76 year old male with a history of nasogastric tube placement EXAM: PORTABLE ABDOMEN - 1 VIEW COMPARISON:  CT 03/18/2017, plain film 10/24/2016 FINDINGS: Gastric tube projects over the mediastinum and the upper abdomen with the side-port appearing beyond the GE junction. Incidental imaging of left chest wall subclavian port catheter and overlying EKG leads. Gas within stomach and small bowel. Incidental imaging of low lung volumes and left basilar opacity, potentially atelectasis. IMPRESSION: Gastric tube terminates within the stomach. Electronically Signed   By: Corrie Mckusick D.O.   On: 03/18/2017 09:15       Discharge Exam: Vitals:   03/21/17 0449 03/21/17 0944  BP: 110/62   Pulse: 92 96  Resp: 19 18  Temp: 98.6 F (37 C)    Vitals:   03/20/17 1400 03/20/17 2009 03/21/17 0449 03/21/17 0944  BP: 124/80 112/61 110/62   Pulse: 92 82 92 96  Resp: 20 18 19 18   Temp: 98.5 F (36.9 C) 99.2 F (37.3 C) 98.6 F (37 C)   TempSrc: Oral Oral Oral   SpO2: 94% 94% 94% 93%  Weight:   97.5 kg (215 lb)   Height:        General: Pt is alert, awake, not in acute distress Cardiovascular: RRR, S1/S2 +, no rubs, no gallops Respiratory: CTA  bilaterally, no wheezing, no rhonchi Abdominal: Soft, NT, ND, bowel sounds + Extremities: no edema, no cyanosis    The results of significant diagnostics from this hospitalization (including imaging, microbiology, ancillary and laboratory) are listed below for reference.     Microbiology: No results found for this or any previous visit (from the past 240 hour(s)).   Labs: BNP (last 3 results) No results for input(s): BNP in the last 8760 hours. Basic Metabolic Panel:  Recent Labs Lab 03/18/17 0249 03/18/17 0304 03/19/17 0404 03/20/17 0320  NA 138 140 140 138  K 3.9 3.8 3.7 3.8  CL 104 104 107 105  CO2 24  --  26 26  GLUCOSE 134* 134* 126* 98  BUN 20 20 18 14   CREATININE 1.01 0.90 0.99 0.92  CALCIUM 9.5  --  8.2* 8.2*  Liver Function Tests:  Recent Labs Lab 03/18/17 0249 03/19/17 0404 03/20/17 0320  AST 41 29 34  ALT 31 26 29   ALKPHOS 140* 103 95  BILITOT 1.4* 2.2* 3.0*  PROT 7.9 6.4* 6.2*  ALBUMIN 3.9 3.1* 3.2*    Recent Labs Lab 03/18/17 0249  LIPASE 29   No results for input(s): AMMONIA in the last 168 hours. CBC:  Recent Labs Lab 03/18/17 0249 03/18/17 0304 03/19/17 0404  WBC 9.2  --  7.3  NEUTROABS  --   --  4.7  HGB 17.1* 17.0 14.8  HCT 46.5 50.0 43.1  MCV 93.9  --  96.9  PLT 128*  --  127*   Cardiac Enzymes: No results for input(s): CKTOTAL, CKMB, CKMBINDEX, TROPONINI in the last 168 hours. BNP: Invalid input(s): POCBNP CBG:  Recent Labs Lab 03/20/17 0654 03/20/17 1136 03/20/17 1742 03/20/17 2129 03/21/17 0731  GLUCAP 97 87 92 90 77   D-Dimer No results for input(s): DDIMER in the last 72 hours. Hgb A1c No results for input(s): HGBA1C in the last 72 hours. Lipid Profile No results for input(s): CHOL, HDL, LDLCALC, TRIG, CHOLHDL, LDLDIRECT in the last 72 hours. Thyroid function studies No results for input(s): TSH, T4TOTAL, T3FREE, THYROIDAB in the last 72 hours.  Invalid input(s): FREET3 Anemia work up No results for  input(s): VITAMINB12, FOLATE, FERRITIN, TIBC, IRON, RETICCTPCT in the last 72 hours. Urinalysis    Component Value Date/Time   COLORURINE AMBER (A) 03/18/2017 1239   APPEARANCEUR CLEAR 03/18/2017 1239   LABSPEC >1.046 (H) 03/18/2017 1239   PHURINE 5.0 03/18/2017 1239   GLUCOSEU NEGATIVE 03/18/2017 1239   HGBUR NEGATIVE 03/18/2017 1239   BILIRUBINUR NEGATIVE 03/18/2017 1239   KETONESUR NEGATIVE 03/18/2017 1239   PROTEINUR NEGATIVE 03/18/2017 1239   UROBILINOGEN 2.0 (H) 07/15/2014 1101   NITRITE NEGATIVE 03/18/2017 1239   LEUKOCYTESUR NEGATIVE 03/18/2017 1239   Sepsis Labs Invalid input(s): PROCALCITONIN,  WBC,  LACTICIDVEN Microbiology No results found for this or any previous visit (from the past 240 hour(s)).   Time coordinating discharge: 30 minutes  SIGNED:  Dessa Phi, DO [facility] Pager [phone]  If 7PM-7AM, please contact night-coverage [email or web address] Password [facility] 03/21/2017, 10:57 AM

## 2017-03-21 NOTE — Discharge Instructions (Signed)
Small Bowel Obstruction A small bowel obstruction means that something is blocking the small bowel. The small bowel is also called the small intestine. It is the long tube that connects the stomach to the colon. An obstruction will stop food and fluids from passing through the small bowel. Treatment depends on what is causing the problem and how bad the problem is. Follow these instructions at home:  Get a lot of rest.  Follow your diet as told by your doctor. You may need to: ? Only drink clear liquids until you start to get better. ? Avoid solid foods as told by your doctor.  Take over-the-counter and prescription medicines only as told by your doctor.  Keep all follow-up visits as told by your doctor. This is important. Contact a doctor if:  You have a fever.  You have chills. Get help right away if:  You have pain or cramps that get worse.  You throw up (vomit) blood.  You have a feeling of being sick to your stomach (nausea) that does not go away.  You cannot stop throwing up.  You cannot drink fluids.  You feel confused.  You feel dry or thirsty (dehydrated).  Your belly gets more bloated.  You feel weak or you pass out (faint). This information is not intended to replace advice given to you by your health care provider. Make sure you discuss any questions you have with your health care provider. Document Released: 09/24/2004 Document Revised: 04/13/2016 Document Reviewed: 10/11/2014 Elsevier Interactive Patient Education  2018 [place] Meal Plan A soft-food meal plan includes foods that are safe and easy to swallow. This meal plan typically is used:  If you are having trouble chewing or swallowing foods.  As a transition meal plan after only having had liquid meals for a long period.  What do I need to know about the soft-food meal plan? A soft-food meal plan includes tender foods that are soft and easy to chew and swallow. In most cases,  bite-sized pieces of food are easier to swallow. A bite-sized piece is about  inch or smaller. Foods in this plan do not need to be ground or pureed. Foods that are very hard, crunchy, or sticky should be avoided. Also, breads, cereals, yogurts, and desserts with nuts, seeds, or fruits should be avoided. What foods can I eat? Grains Rice and wild rice. Moist bread, dressing, pasta, and noodles. Well-moistened dry or cooked cereals, such as farina (cooked wheat cereal), oatmeal, or grits. Biscuits, breads, muffins, pancakes, and waffles that have been well moistened. Vegetables Shredded lettuce. Cooked, tender vegetables, including potatoes without skins. Vegetable juices. Broths or creamed soups made with vegetables that are not stringy or chewy. Strained tomatoes (without seeds). Fruits Canned or well-cooked fruits. Soft (ripe), peeled fresh fruits, such as peaches, nectarines, kiwi, cantaloupe, honeydew melon, and watermelon (without seeds). Soft berries with small seeds, such as strawberries. Fruit juices (without pulp). Meats and Other Protein Sources Moist, tender, lean beef. Mutton. Lamb. Veal. Chicken. [place]. Liver. Ham. Fish without bones. Eggs. Dairy Milk, milk drinks, and cream. Plain cream cheese and cottage cheese. Plain yogurt. Sweets/Desserts Flavored gelatin desserts. Custard. Plain ice cream, frozen yogurt, sherbet, milk shakes, and malts. Plain cakes and cookies. Plain hard candy. Other Butter, margarine (without trans fat), and cooking oils. Mayonnaise. Cream sauces. Mild spices, salt, and sugar. Syrup, molasses, honey, and jelly. The items listed above may not be a complete list of recommended foods or beverages. Contact your  dietitian for more options. What foods are not recommended? Grains Dry bread, toast, crackers that have not been moistened. Coarse or dry cereals, such as bran, granola, and shredded wheat. Tough or chewy crusty breads, such as [place] bread or  baguettes. Vegetables Corn. Raw vegetables except shredded lettuce. Cooked vegetables that are tough or stringy. Tough, crisp, fried potatoes and potato skins. Fruits Fresh fruits with skins or seeds or both, such as apples, pears, or grapes. Stringy, high-pulp fruits, such as papaya, pineapple, coconut, or mango. Fruit leather, fruit roll-ups, and all dried fruits. Meats and Other Protein Sources Sausages and hot dogs. Meats with gristle. Fish with bones. Nuts, seeds, and chunky peanut or other nut butters. Sweets/Desserts Cakes or cookies that are very dry or chewy. The items listed above may not be a complete list of foods and beverages to avoid. Contact your dietitian for more information. This information is not intended to replace advice given to you by your health care provider. Make sure you discuss any questions you have with your health care provider. Document Released: 11/24/2007 Document Revised: 01/23/2016 Document Reviewed: 07/14/2013 Elsevier Interactive Patient Education  2017 [employer].

## 2017-03-21 NOTE — Care Management Important Message (Signed)
Important Message  Patient Details  Name: MASSON NALEPA MRN: [id number] Date of Birth: 1940-12-14   Medicare Important Message Given:  Yes    Erenest Rasher, RN 03/21/2017, 12:13 PM

## 2017-03-21 NOTE — Final Consult Note (Signed)
Consultant Final Sign-Off Note    Assessment/Final recommendations  Joseph [name] is a 76 y.o. male followed by [facility] for SBO   Wound care (if applicable):    Diet at discharge: per primary team   Activity at discharge: per primary team   Follow-up appointment:     Pending results:  Unresulted Labs    None       Medication recommendations:   Other recommendations: f/u PRN    Thank you for allowing [place] to participate in the care of your patient!  Please consult [place] again if you have further needs for your patient.  Joseph [name] C. 10/20/2540 7:06 AM    Subjective   Tolerating a diet and having bowel function  Objective  Vital signs in last 24 hours: Temp:  [98.5 F (36.9 C)-99.2 F (37.3 C)] 98.6 F (37 C) (07/22 0449) Pulse Rate:  [82-92] 92 (07/22 0449) Resp:  [18-20] 19 (07/22 0449) BP: (110-124)/(61-80) 110/62 (07/22 0449) SpO2:  [94 %] 94 % (07/22 0449) Weight:  [97.5 kg (215 lb)] 97.5 kg (215 lb) (07/22 0449)  General: NAD  Abd soft, nondistended   Pertinent labs and Studies:  Recent Labs  03/19/17 0404  WBC 7.3  HGB 14.8  HCT 43.1   BMET  Recent Labs  03/19/17 0404 03/20/17 0320  NA 140 138  K 3.7 3.8  CL 107 105  CO2 26 26  GLUCOSE 126* 98  BUN 18 14  CREATININE 0.99 0.92  CALCIUM 8.2* 8.2*   No results for input(s): LABURIN in the last 72 hours. Results for orders placed or performed during the hospital encounter of 10/20/16  C difficile quick scan w PCR reflex     Status: None   Collection Time: 10/21/16  4:44 PM  Result Value Ref Range Status   C Diff antigen NEGATIVE NEGATIVE Final   C Diff toxin NEGATIVE NEGATIVE Final   C Diff interpretation No C. difficile detected.  Final    Imaging: No results found.

## 2017-03-22 DIAGNOSIS — H25011 Cortical age-related cataract, right eye: Secondary | ICD-10-CM | POA: Diagnosis not present

## 2017-03-22 DIAGNOSIS — H2511 Age-related nuclear cataract, right eye: Secondary | ICD-10-CM | POA: Diagnosis not present

## 2017-03-26 DIAGNOSIS — I5032 Chronic diastolic (congestive) heart failure: Secondary | ICD-10-CM | POA: Diagnosis not present

## 2017-03-30 DIAGNOSIS — H2511 Age-related nuclear cataract, right eye: Secondary | ICD-10-CM | POA: Diagnosis not present

## 2017-03-30 DIAGNOSIS — H25811 Combined forms of age-related cataract, right eye: Secondary | ICD-10-CM | POA: Diagnosis not present

## 2017-04-16 DIAGNOSIS — I10 Essential (primary) hypertension: Secondary | ICD-10-CM | POA: Diagnosis not present

## 2017-04-16 DIAGNOSIS — I480 Paroxysmal atrial fibrillation: Secondary | ICD-10-CM | POA: Diagnosis not present

## 2017-04-16 DIAGNOSIS — Z6829 Body mass index (BMI) 29.0-29.9, adult: Secondary | ICD-10-CM | POA: Diagnosis not present

## 2017-04-16 DIAGNOSIS — J449 Chronic obstructive pulmonary disease, unspecified: Secondary | ICD-10-CM | POA: Diagnosis not present

## 2017-04-16 DIAGNOSIS — E784 Other hyperlipidemia: Secondary | ICD-10-CM | POA: Diagnosis not present

## 2017-04-16 DIAGNOSIS — I5032 Chronic diastolic (congestive) heart failure: Secondary | ICD-10-CM | POA: Diagnosis not present

## 2017-04-16 DIAGNOSIS — K7460 Unspecified cirrhosis of liver: Secondary | ICD-10-CM | POA: Diagnosis not present

## 2017-04-16 DIAGNOSIS — E038 Other specified hypothyroidism: Secondary | ICD-10-CM | POA: Diagnosis not present

## 2017-05-17 ENCOUNTER — Ambulatory Visit (HOSPITAL_BASED_OUTPATIENT_CLINIC_OR_DEPARTMENT_OTHER): Payer: PPO

## 2017-05-17 VITALS — BP 132/74 | HR 70 | Temp 97.5°F | Resp 20

## 2017-05-17 DIAGNOSIS — Z452 Encounter for adjustment and management of vascular access device: Secondary | ICD-10-CM | POA: Diagnosis not present

## 2017-05-17 DIAGNOSIS — C3411 Malignant neoplasm of upper lobe, right bronchus or lung: Secondary | ICD-10-CM

## 2017-05-17 DIAGNOSIS — Z95828 Presence of other vascular implants and grafts: Secondary | ICD-10-CM

## 2017-05-17 MED ORDER — HEPARIN SOD (PORK) LOCK FLUSH 100 UNIT/ML IV SOLN
500.0000 [IU] | Freq: Once | INTRAVENOUS | Status: AC
  Administered 2017-05-17: 500 [IU] via INTRAVENOUS
  Filled 2017-05-17: qty 5

## 2017-05-17 MED ORDER — SODIUM CHLORIDE 0.9% FLUSH
10.0000 mL | INTRAVENOUS | Status: DC | PRN
  Administered 2017-05-17: 10 mL via INTRAVENOUS
  Filled 2017-05-17: qty 10

## 2017-05-21 ENCOUNTER — Encounter: Payer: Self-pay | Admitting: Cardiology

## 2017-05-21 ENCOUNTER — Ambulatory Visit (INDEPENDENT_AMBULATORY_CARE_PROVIDER_SITE_OTHER): Payer: PPO | Admitting: Cardiology

## 2017-05-21 VITALS — BP 126/70 | HR 81 | Ht 72.0 in | Wt 213.0 lb

## 2017-05-21 DIAGNOSIS — I2583 Coronary atherosclerosis due to lipid rich plaque: Secondary | ICD-10-CM | POA: Diagnosis not present

## 2017-05-21 DIAGNOSIS — I2510 Atherosclerotic heart disease of native coronary artery without angina pectoris: Secondary | ICD-10-CM

## 2017-05-21 DIAGNOSIS — I480 Paroxysmal atrial fibrillation: Secondary | ICD-10-CM

## 2017-05-21 DIAGNOSIS — I5032 Chronic diastolic (congestive) heart failure: Secondary | ICD-10-CM

## 2017-05-21 DIAGNOSIS — Z23 Encounter for immunization: Secondary | ICD-10-CM | POA: Diagnosis not present

## 2017-05-21 MED ORDER — FUROSEMIDE 20 MG PO TABS: 20.0000 mg | ORAL_TABLET | Freq: Every day | ORAL | 3 refills | Status: DC

## 2017-05-21 MED ORDER — METOPROLOL TARTRATE 25 MG PO TABS: ORAL_TABLET | ORAL | 3 refills | Status: DC

## 2017-05-21 NOTE — Patient Instructions (Signed)

## 2017-05-21 NOTE — Progress Notes (Signed)
Cardiology Office Note:    Date:  05/21/2017   ID:  Joseph [name], Joseph [name] July 08, 1941, MRN [id number]  PCP:  [name], MD  Cardiologist:  Ena Dawley, MD   Referring MD: [name], MD   Chief complain: 9 months follow up  History of Present Illness:    Joseph [name] is a 76 y.o. male with a hx of hyperlipidemia, known nonobstructive coronary artery disease was originally referred to [place] for preop evaluation prior to 4.4 cm AAA repair.  The patient states that approximately 10 years ago he was evaluated for coronary artery disease because of abnormal EKG that was followed by abnormal stress test and a cardiac catheterization that only showed minimal atherosclerotic plaque. The patient also has a history of lung cancer for which she has been treated with chemotherapy and radiation therapy in 2012 and is currently in remission. The patient quit smoking 2 years ago and is being treated for COPD and emphysema with Spiriva.   On 10/31/2013 the patient underwent Aorto to left and right external iliac bypass, left internal iliac artery bypass, ligation right internal iliac aneurysm, reimplantation of inferior mesenteric artery. Thrombectomy of left and right iliac and femoral arteries. This was complicated with respiratory failure in addition to acute blood loss anemia and the patient received 6 units of FFP and 4 units of PRBCs in addition to 2 units of platelets during his admission. He was intubated and sedated for several days. He is feeling much better today and recovered very well. He denied having any significant chest pain, shortness of breath, cough or hemoptysis. He feels overall very tired and hasn't been back to preop baseline yet. He states that post surgery he retained 20 lbs of fluids but now is bak to baseline. Has some residual minimal LE edema.   He has known hyperlipidemia however he developed liver enzymes elevation after his chemotherapy and is being followed a  GI specialist Dr. Charlott Holler, he has never been started on statins. He denies any alcohol intake.  07/23/2014 The patient was hospitalized with SBO, has had eval for both AAA progression and for an incarcerated incisional ventral hernia, s/p resection, mesh and wound vac placement. He was also diagnosed with new onset a-fib with RVR, started on amiodarone, no anticoagulation because of high risk of bleeding (recent surgery, ongoing incision wound, liver cirrhosis).  He was also started on home O2 therapy, coming for a follow up visit 5 days after discharge.  He feels weak, hypotensive at home, BP in 90', has not been taking lasix because of that. No palpitations. No chest pain, ongoing stable SOB after walking few steps. No PND, minimal LE edema.  He has been followed by GI in his LFTs have improved.  05/21/2017 - this is 9 months follow-up patient has been doing great cardiac-wise he denies any chest pain he stable dyspnea on exertion, no palpitations dizziness or syncope. He's been compliant with his medication and has no side effects. He was hospitalized in July for small bowel obstruction that has resolved with conservative approach.   Past Medical History:  Diagnosis Date  . Acute on chronic diastolic CHF (congestive heart failure), NYHA class 3 ([place]) 07/23/2014  . Arthritis    left knee and back arthrtis  . Atrial fibrillation with rapid ventricular response ([place]) 07/04/2014  . CAD (coronary artery disease)   . Cataract   . Cirrhosis ([place])    By radiography, seen on CT  . Diverticulosis   . Emphysema   .  Enlarged prostate   . Heart murmur    teen  . History of colon polyps 2006,2008  . History of transfusion 2015  . Hx of radiation therapy 09/02/11 to 10/19/11   chest  . Hx of radiation therapy   . Hyperlipidemia    takes Pravastatin  . Hypothyroidism    takes Synthroid daily  . Lung cancer ([place])    right upper lobe  . Personal history of adenomatous colonic polyps 07/04/2012    2006, adenomatous right colon polyp removed, then ablated in 2007 followup, 2008, adenomatous polyp of the right colon removed with hot biopsy forceps. All by Dr. Lajoyce Corners 01/03/2013 diminutive polyp ascending colon - adenoma    . Rosacea   . Shortness of breath    with exertion  . Small bowel obstruction ([place]) 07/05/2014  . Status post chemotherapy    last chemo 2013  . Umbilical hernia, incarcerated- surgery 07/10/14 07/05/2014    Past Surgical History:  Procedure Laterality Date  . ABDOMINAL AORTAGRAM  10/20/2013   Procedure: ABDOMINAL Maxcine Ham;  Surgeon: Elam Dutch, MD;  Location: [facility] CATH LAB;  Service: Cardiovascular;;  . ABDOMINAL AORTIC ANEURYSM REPAIR N/A 10/31/2013   Procedure: aorto to left external iliac and right external iliac and left internal iliac, reimplantation of inferior mesenteric artery and ligation of left iliac artery aneursym;  Surgeon: Elam Dutch, MD;  Location: [place];  Service: Vascular;  Laterality: N/A;  . ABDOMINAL WOUND DEHISCENCE N/A 11/10/2013   Procedure: ABDOMINAL WOUND CLOSURE ;  Surgeon: Serafina Mitchell, MD;  Location: [place];  Service: Vascular;  Laterality: N/A;  . bletharoplasty    . BOWEL RESECTION N/A 07/10/2014   Procedure: SMALL BOWEL RESECTION;  Surgeon: [name], MD;  Location: [place];  Service: General;  Laterality: N/A;  . CARDIAC CATHETERIZATION  2003  . COLONOSCOPY W/ BIOPSIES     multiple   . ESOPHAGOGASTRODUODENOSCOPY  2006  . FINGER SURGERY  2008   left pointer finger  . HERNIA REPAIR  70's  . INCISIONAL HERNIA REPAIR N/A 07/10/2014   Procedure: HERNIA REPAIR INCISIONAL;  Surgeon: [name], MD;  Location: [place];  Service: General;  Laterality: N/A;  . KNEE ARTHROSCOPY  2008   left knee  . LAPAROTOMY N/A 07/10/2014   Procedure: EXPLORATORY LAPAROTOMY;  Surgeon: [name], MD;  Location: [place];  Service: General;  Laterality: N/A;  . LOWER EXTREMITY ANGIOGRAM Bilateral 10/20/2013   Procedure: LOWER EXTREMITY ANGIOGRAM;   Surgeon: Elam Dutch, MD;  Location[facility] C[facility] LAB;  Service: Cardiovascular;  Laterality: Bilateral;  . LYSIS OF ADHESION N/A 07/10/2014   Procedure: LYSIS OF ADHESION - 90 minutes;  Surgeon: [name], MD;  Location: [place];  Service: General;  Laterality: N/A;  . PORTACATH PLACEMENT  08/10/2011   Procedure: INSERTION PORT-A-CATH;  Surgeon: Pierre Bali, MD;  Location: [place];  Service: Thoracic;  Laterality: Left;  Left Subclavian  . PTOSIS REPAIR Bilateral 12-02-2015  . THROMBECTOMY ILIAC ARTERY Bilateral 10/31/2013   Procedure: THROMBECTOMY ILIAC ARTERY;  Surgeon: Elam Dutch, MD;  Location: [place];  Service: Vascular;  Laterality: Bilateral;  . TONSILLECTOMY     as a child  . VASECTOMY  70's  . VENTRAL HERNIA REPAIR N/A 04/04/2015   Procedure: OPEN REPAIR OF RECURRENT ABDOMINAL WALL HERNIA WITH MESH, COMPLEX LYSIS OF ADHESIONS X1 HR TAR COMPONENT SEPARATION;  Surgeon: Greer Pickerel, MD;  [facility];  Service: General;  Laterality: N/A;  Current Medications: Current Meds  Medication Sig  . acidophilus (RISAQUAD) CAPS capsule Take 1 capsule by mouth daily.  Marland Kitchen albuterol (PROVENTIL HFA;VENTOLIN HFA) 108 (90 Base) MCG/ACT inhaler Inhale 1-2 puffs into the lungs every 6 (six) hours as needed for wheezing or shortness of breath.  Marland Kitchen aspirin EC 81 MG tablet Take 1 tablet (81 mg total) by mouth daily.  . furosemide (LASIX) 20 MG tablet Take 1 tablet (20 mg total) by mouth daily.  . L-ARGININE PO Take 1 tablet by mouth 2 (two) times daily.   . metoprolol tartrate (LOPRESSOR) 25 MG tablet TAKE 1 TABLET (25 MG TOTAL) BY MOUTH 2 (TWO) TIMES DAILY.  . Multiple Vitamin (MULTIVITAMIN WITH MINERALS) TABS tablet Take 1 tablet by mouth daily.  Marland Kitchen OVER THE COUNTER MEDICATION Take 2 tablets by mouth 2 (two) times daily. Medication:  Lung Support  . OVER THE COUNTER MEDICATION Take 1 tablet by mouth daily.  Marland Kitchen Specialty Vitamins Products (PROSTATE PO) Take 1 tablet by mouth 2 (two) times  daily.  Marland Kitchen SYNTHROID 175 MCG tablet Take 175 mcg by mouth daily before breakfast.   . tiotropium (SPIRIVA) 18 MCG inhalation capsule Place 18 mcg into inhaler and inhale daily.   . vitamin B-12 (CYANOCOBALAMIN) 1000 MCG tablet Take 1,000 mcg by mouth daily.  . [DISCONTINUED] furosemide (LASIX) 20 MG tablet TAKE 1 TABLET (20 MG TOTAL) BY MOUTH DAILY.  . [DISCONTINUED] metoprolol tartrate (LOPRESSOR) 25 MG tablet TAKE 1 TABLET (25 MG TOTAL) BY MOUTH 2 (TWO) TIMES DAILY.     Allergies:   Patient has no known allergies.   Social History   Social History  . Marital status: Married    Spouse name: N/A  . Number of children: N/A  . Years of education: N/A   Social History Main Topics  . Smoking status: Former Smoker    Packs/day: 1.50    Years: 40.00    Types: Cigarettes    Quit date: 07/02/2011  . Smokeless tobacco: Former Systems developer  . Alcohol use 0.0 oz/week     Comment: social  . Drug use: No  . Sexual activity: No   Other Topics Concern  . None   Social History Narrative  . None     Family History: The patient's family history includes AAA (abdominal aortic aneurysm) in his brother; Cancer in his father; Diabetes in his father; Hyperlipidemia in his brother, mother, and sister; Hypertension in his brother, mother, and sister. There is no history of Anesthesia problems, Hypotension, Malignant hyperthermia, Pseudochol deficiency, or Colon cancer. ROS:   Please see the history of present illness.    All other systems reviewed and are negative.  EKGs/Labs/Other Studies Reviewed:    The following studies were reviewed today:  EKG:  EKG is  ordered today.  The ekg ordered today demonstrates Sinus rhythm normal EKG unchanged from prior, this was personally reviewed.  Recent Labs: 10/21/2016: TSH 0.587 10/24/2016: Magnesium 1.7 03/19/2017: Hemoglobin 14.8; Platelets 127 03/20/2017: ALT 29; BUN 14; Creatinine, Ser 0.92; Potassium 3.8; Sodium 138  Recent Lipid Panel    Component Value  Date/Time   CHOL 188 10/24/2014 1313   TRIG 195 (H) 10/24/2014 1313   HDL 35 (L) 10/24/2014 1313   LDLCALC 114 (H) 10/24/2014 1313    Physical Exam:    VS:  BP 126/70   Pulse 81   Ht 6' (1.829 m)   Wt 213 lb (96.6 kg)   SpO2 96%   BMI 28.89 kg/m  Wt Readings from Last 3 Encounters:  05/21/17 213 lb (96.6 kg)  03/21/17 215 lb (97.5 kg)  02/09/17 210 lb (95.3 kg)     GEN: Well nourished, well developed in no acute distress HEENT: Normal NECK: No JVD; No carotid bruits LYMPHATICS: No lymphadenopathy CARDIAC: RRR, no murmurs, rubs, gallops RESPIRATORY:  Clear to auscultation without rales, wheezing or rhonchi  ABDOMEN: Soft, non-tender, non-distended MUSCULOSKELETAL:  No edema; No deformity  SKIN: Warm and dry NEUROLOGIC:  Alert and oriented x 3 PSYCHIATRIC:  Normal affect   ASSESSMENT:    1. Chronic diastolic CHF (congestive heart failure) ([place])   2. Need for immunization against influenza   3. Coronary artery disease due to lipid rich plaque   4. Paroxysmal atrial fibrillation ([facility])    PLAN:    In order of problems listed above:  1. The patient feels euvolemic, continue the same management with Lasix 20 mg daily. Creatinine was normal in July 2018. 2. Patient obtained a flu shot today. 3. He has had no symptoms of chest pain and has stable dyspnea on moderate exertion, will continue aspirin, metoprolol, he is not on statin as he previously had deceleration.  4. No recent palpitations, in sinus rhythm today. On aspirin only if prior bleeding.   Medication Adjustments/Labs and Tests Ordered: Current medicines are reviewed at length with the patient today.  Concerns regarding medicines are outlined above.  Orders Placed This Encounter  Procedures  . Flu Vaccine QUAD 36+ mos IM  . EKG 12-Lead   Meds ordered this encounter  Medications  . metoprolol tartrate (LOPRESSOR) 25 MG tablet    Sig: TAKE 1 TABLET (25 MG TOTAL) BY MOUTH 2 (TWO) TIMES DAILY.     Dispense:  180 tablet    Refill:  3  . furosemide (LASIX) 20 MG tablet    Sig: Take 1 tablet (20 mg total) by mouth daily.    Dispense:  90 tablet    Refill:  3    Signed, Ena Dawley, MD  05/21/2017 9:48 AM    [facility]

## 2017-06-07 ENCOUNTER — Ambulatory Visit (INDEPENDENT_AMBULATORY_CARE_PROVIDER_SITE_OTHER): Payer: PPO | Admitting: Orthopaedic Surgery

## 2017-06-07 ENCOUNTER — Encounter (INDEPENDENT_AMBULATORY_CARE_PROVIDER_SITE_OTHER): Payer: Self-pay | Admitting: Orthopaedic Surgery

## 2017-06-07 ENCOUNTER — Ambulatory Visit (INDEPENDENT_AMBULATORY_CARE_PROVIDER_SITE_OTHER): Payer: PPO

## 2017-06-07 VITALS — BP 108/68 | HR 73 | Resp 14 | Ht 72.0 in | Wt 200.0 lb

## 2017-06-07 DIAGNOSIS — M1712 Unilateral primary osteoarthritis, left knee: Secondary | ICD-10-CM

## 2017-06-07 NOTE — Progress Notes (Signed)
Office Visit Note   Patient: Joseph [name]           Date of Birth: 19-Jun-1941           MRN: [id number] Visit Date: 06/07/2017              Requested by: [name], MD [address] [place], [place] [address] PCP: [name], MD   Assessment & Plan: Visit Diagnoses:  1. Unilateral primary osteoarthritis, left knee     Plan: End-stage osteoarthritis left knee. [name] would like to proceed with total knee replacement. I discussed this in detail with both he and his wife. We talked about the surgery, incision, hospitalization, rehabilitation and immobilization with walker, crutches or a cane. He'll need a number of clearances and we'll provide him with them. schedule surgery once we have them in hand.  Follow-Up Instructions: Return will schedule surgery TKR.   Orders:  Orders Placed This Encounter  Procedures  . XR KNEE 3 VIEW LEFT   No orders of the defined types were placed in this encounter.     Procedures: No procedures performed   Clinical Data: No additional findings.   Subjective: Chief Complaint  Patient presents with  . Left Knee - Pain, Edema    Mr. Joseph [name] is a 52 y o that presents with chronic Left knee pain. Hx of Left knee arthroscopy. He relates he has a lot pain and swelling with weakness. Pt states the knee "gives way".  Joseph [name] is been experiencing progressive problems with his left knee over period of many months and years. I've seen him on occasion to inject his knee with cortisone. He's had prior films demonstrating significant, end-stage osteoarthritis of his left knee. He's had a number of comorbidities that have compromise his ability to have surgery including intestinal obstruction on several occasions and his history of lung cancer treated successfully now 5 years out from any evidence of new tumor. He's having pain to the point of compromise of the activity was discussed total knee replacement. He is accompanied by his wife,  [name] Over 30 minutes discussing his new x-rays, surgery what he can expect postoperatively, rehabilitation. We'll proceed with  scheduling surgery once we have all of his clearance  HPI  Review of Systems  Constitutional: Negative for fatigue.  HENT: Negative for hearing loss.   Respiratory: Positive for shortness of breath. Negative for apnea and chest tightness.   Cardiovascular: Negative for chest pain, palpitations and leg swelling.  Gastrointestinal: Negative for blood in stool, constipation and diarrhea.  Genitourinary: Positive for difficulty urinating.  Musculoskeletal: Positive for back pain. Negative for arthralgias, joint swelling, myalgias, neck pain and neck stiffness.  Neurological: Positive for weakness. Negative for numbness and headaches.  Hematological: Does not bruise/bleed easily.  Psychiatric/Behavioral: Negative for sleep disturbance. The patient is not nervous/anxious.      Objective: Vital Signs: BP 108/68   Pulse 73   Resp 14   Ht 6' (1.829 m)   Wt 200 lb (90.7 kg)   BMI 27.12 kg/m   Physical Exam  Ortho Exam awake alert and oriented 3 comfortable sitting. Is not having any shortness of breath or chest pain. Lacks about 9-10 to full extension of his left knee with a very minimal effusion. Flexes over 110. No instability. Some patella crepitation. Mostly medial joint pain. Very mild pitting edema both ankles no calf pain. 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 Motor is  intact  No specialty comments available.  Imaging: Xr Knee 3 View Left  Result Date: 06/07/2017 Films the left knee replacement in 3 projections standing. There is not a significant change in the films that were performed in December 2017. There is obvious tricompartmental degenerative changes predominantly in the medial compartment where there is irregularity of the distal femur and significant decrease in the joint space. There are  peripheral osteophytes and subchondral sclerosis in the medial compartment. Degenerative changes at the patellofemoral joint and lateral compartment were also visualized. There is some calcification of the femoral artery and popliteal artery    PMFS History: Patient Active Problem List   Diagnosis Date Noted  . SBO (small bowel obstruction) ([place]) 03/18/2017  . Elevated d-dimer   . Hypoxia   . SOB (shortness of breath)   . Small bowel obstruction ([place]) 10/20/2016  . AKI (acute kidney injury) ([place]) 10/20/2016  . Acute respiratory failure with hypoxia ([place]) 10/20/2016  . Chronic pain of left knee 07/06/2016  . Port catheter in place 02/27/2016  . Claudication ([place]) 02/18/2016  . Statin intolerance 02/18/2016  . Essential hypertension 02/18/2016  . Coronary artery disease due to lipid rich plaque 08/29/2015  . Acute on chronic diastolic CHF (congestive heart failure), NYHA class 2 ([place]) 08/29/2015  . 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 Hypoxemia requiring supplemental oxygen   . Atrial fibrillation ([place]) 07/23/2014  . COPD (chronic obstructive pulmonary disease) ([place]) 07/06/2014  . Hypothyroidism 07/06/2014  . Numbness-Left Thigh 12/14/2013  . AAA- s/p Ao-Iliac BPG March 2015 10/20/2013  . Hyperlipidemia 10/09/2013  . Hepatic cirrhosis ([place]) 12/02/2012  . Cancer of right lung ([place]) 08/06/2011   Past Medical History:  Diagnosis Date  . Acute on chronic diastolic CHF (congestive heart failure), NYHA class 3 ([place]) 07/23/2014  . Arthritis    left knee and back arthrtis  . Atrial fibrillation with rapid ventricular response ([place]) 07/04/2014  . CAD (coronary artery disease)   . Cataract   . Cirrhosis ([place])    By radiography, seen on CT  . Diverticulosis   . Emphysema   . Enlarged prostate   . Heart murmur    teen  . History of colon polyps 2006,2008  . History of transfusion 2015  . Hx of radiation therapy 09/02/11 to 10/19/11     chest  . Hx of radiation therapy   . Hyperlipidemia    takes Pravastatin  . Hypothyroidism    takes Synthroid daily  . Lung cancer ([place])    right upper lobe  . Personal history of adenomatous colonic polyps 07/04/2012   2006, adenomatous right colon polyp removed, then ablated in 2007 followup, 2008, adenomatous polyp of the right colon removed with hot biopsy forceps. All by Dr. Lajoyce Corners 01/03/2013 diminutive polyp ascending colon - adenoma    . Rosacea   . Shortness of breath    with exertion  . Small bowel obstruction ([place]) 07/05/2014  . Status post chemotherapy    last chemo 2013  . Umbilical hernia, incarcerated- surgery 07/10/14 07/05/2014    Family History  Problem Relation Age of Onset  . Cancer Father   . Diabetes Father   . Hyperlipidemia Mother   . Hypertension Mother   . Hyperlipidemia Brother   . AAA (abdominal aortic aneurysm) Brother   . Hypertension Brother   . Hyperlipidemia Sister   . Hypertension Sister   . Anesthesia problems Neg Hx   . Hypotension Neg Hx   .  Malignant hyperthermia Neg Hx   . Pseudochol deficiency Neg Hx   . Colon cancer Neg Hx     Past Surgical History:  Procedure Laterality Date  . ABDOMINAL AORTAGRAM  10/20/2013   Procedure: ABDOMINAL Maxcine Ham;  Surgeon: Elam Dutch, MD;  Location[facility] CATH LAB;  Service: Cardiovascular;;  . ABDOMINAL AORTIC ANEURYSM REPAIR N/A 10/31/2013   Procedure: aorto to left external iliac and right external iliac and left internal iliac, reimplantation of inferior mesenteric artery and ligation of left iliac artery aneursym;  Surgeon: Elam Dutch, MD;  Location: [place];  Service: Vascular;  Laterality: N/A;  . ABDOMINAL WOUND DEHISCENCE N/A 11/10/2013   Procedure: ABDOMINAL WOUND CLOSURE ;  Surgeon: Serafina Mitchell, MD;  Location: [place];  Service: Vascular;  Laterality: N/A;  . bletharoplasty    . BOWEL RESECTION N/A 07/10/2014   Procedure: SMALL BOWEL RESECTION;  Surgeon: [name], MD;  Location: [place];   Service: General;  Laterality: N/A;  . CARDIAC CATHETERIZATION  2003  . COLONOSCOPY W/ BIOPSIES     multiple   . ESOPHAGOGASTRODUODENOSCOPY  2006  . FINGER SURGERY  2008   left pointer finger  . HERNIA REPAIR  70's  . INCISIONAL HERNIA REPAIR N/A 07/10/2014   Procedure: HERNIA REPAIR INCISIONAL;  Surgeon: [name], MD;  Location: [place];  Service: General;  Laterality: N/A;  . KNEE ARTHROSCOPY  2008   left knee  . LAPAROTOMY N/A 07/10/2014   Procedure: EXPLORATORY LAPAROTOMY;  Surgeon: [name], MD;  Location: [place];  Service: General;  Laterality: N/A;  . LOWER EXTREMITY ANGIOGRAM Bilateral 10/20/2013   Procedure: LOWER EXTREMITY ANGIOGRAM;  Surgeon: Elam Dutch, MD;  Location: [facility] CATH LAB;  Service: Cardiovascular;  Laterality: Bilateral;  . LYSIS OF ADHESION N/A 07/10/2014   Procedure: LYSIS OF ADHESION - 90 minutes;  Surgeon: [name], MD;  Location: [place];  Service: General;  Laterality: N/A;  . PORTACATH PLACEMENT  08/10/2011   Procedure: INSERTION PORT-A-CATH;  Surgeon: Pierre Bali, MD;  Location: [place];  Service: Thoracic;  Laterality: Left;  Left Subclavian  . PTOSIS REPAIR Bilateral 12-02-2015  . THROMBECTOMY ILIAC ARTERY Bilateral 10/31/2013   Procedure: THROMBECTOMY ILIAC ARTERY;  Surgeon: Elam Dutch, MD;  Location: [place];  Service: Vascular;  Laterality: Bilateral;  . TONSILLECTOMY     as a child  . VASECTOMY  70's  . VENTRAL HERNIA REPAIR N/A 04/04/2015   Procedure: OPEN REPAIR OF RECURRENT ABDOMINAL WALL HERNIA WITH MESH, COMPLEX LYSIS OF ADHESIONS X1 HR TAR COMPONENT SEPARATION;  Surgeon: Greer Pickerel, MD;  Location[facility];  Service: General;  Laterality: N/A;   Social History   Occupational History  . Not on file.   Social History Main Topics  . Smoking status: Former Smoker    Packs/day: 1.50    Years: 40.00    Types: Cigarettes    Quit date: 07/02/2011  . Smokeless tobacco: Former Systems developer  . Alcohol use 0.0 oz/week     Comment: social    . Drug use: No  . Sexual activity: No

## 2017-06-08 ENCOUNTER — Telehealth: Payer: Self-pay | Admitting: *Deleted

## 2017-06-08 ENCOUNTER — Telehealth: Payer: Self-pay | Admitting: Cardiology

## 2017-06-08 NOTE — Telephone Encounter (Signed)
   Chart reviewed as part of pre-operative protocol coverage. Given past medical history and time since last visit, based on ACC/AHA guidelines, Joseph [name] would be at acceptable risk for the planned procedure without further cardiovascular testing.   Cecilie Kicks, NP 06/08/2017, 2:22 PM

## 2017-06-08 NOTE — Telephone Encounter (Signed)
Signed        [facility] Pre-operative Risk Assessment    Request for surgical clearance:  1. What type of surgery is being performed? Left Knee Total Replacement   2. When is this surgery scheduled? Pending clearance   3. Are there any medications that need to be held prior to surgery and how long? Pt will need medication clearance and cardiac clearance   4. Practice name and name of physician performing surgery? [facility] Dr. Durward Fortes and Biagio Borg, PA-C  5. What is your office phone and fax number? Phone--[phone]  Fax--[phone]   6. Anesthesia type (None, local, MAC, general) ? general   Nuala Alpha 06/08/2017, 10:57 AM  _________________________________________________________________   (provider comments below)         Per [facility]CRI risk assessment pt is Low risk for total knee surgery.    Cecilie Kicks, FNP-C

## 2017-06-08 NOTE — Telephone Encounter (Signed)
Fax number confirmed.  Will fax this phone note.

## 2017-06-08 NOTE — Telephone Encounter (Signed)
   [facility] Pre-operative Risk Assessment    Request for surgical clearance:  1. What type of surgery is being performed? Left Knee Total Replacement   2. When is this surgery scheduled? Pending clearance   3. Are there any medications that need to be held prior to surgery and how long? Pt will need medication clearance and cardiac clearance   4. Practice name and name of physician performing surgery? [facility] Dr. Durward Fortes and Biagio Borg, PA-C  5. What is your office phone and fax number? Phone--[phone]  Fax--[phone]   6. Anesthesia type (None, local, MAC, general) ? general   Nuala Alpha 06/08/2017, 10:57 AM  _________________________________________________________________   (provider comments below)

## 2017-06-11 ENCOUNTER — Telehealth: Payer: Self-pay

## 2017-06-11 DIAGNOSIS — K7469 Other cirrhosis of liver: Secondary | ICD-10-CM

## 2017-06-11 NOTE — Telephone Encounter (Signed)
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

## 2017-06-11 NOTE — Telephone Encounter (Signed)
Orders put in but I'm unable to reach the patient due to cell phone difficulties.  I will try to reach him later.

## 2017-06-14 NOTE — Telephone Encounter (Signed)
Joseph [name] and he will come by hopefully next week to have the blood drawn.  He wants to stop his garlic this week prior to having bloodwork.

## 2017-06-21 ENCOUNTER — Other Ambulatory Visit (INDEPENDENT_AMBULATORY_CARE_PROVIDER_SITE_OTHER): Payer: PPO

## 2017-06-21 DIAGNOSIS — K7469 Other cirrhosis of liver: Secondary | ICD-10-CM

## 2017-06-21 LAB — CBC WITH DIFFERENTIAL/PLATELET
Basophils Absolute: 0 10*3/uL (ref 0.0–0.1)
Basophils Relative: 0.6 % (ref 0.0–3.0)
EOS PCT: 2.3 % (ref 0.0–5.0)
Eosinophils Absolute: 0.1 10*3/uL (ref 0.0–0.7)
HEMATOCRIT: 42.5 % (ref 39.0–52.0)
HEMOGLOBIN: 14.8 g/dL (ref 13.0–17.0)
LYMPHS PCT: 30.7 % (ref 12.0–46.0)
Lymphs Abs: 1.7 10*3/uL (ref 0.7–4.0)
MCHC: 34.7 g/dL (ref 30.0–36.0)
MCV: 98 fl (ref 78.0–100.0)
MONOS PCT: 11.5 % (ref 3.0–12.0)
Monocytes Absolute: 0.6 10*3/uL (ref 0.1–1.0)
Neutro Abs: 3 10*3/uL (ref 1.4–7.7)
Neutrophils Relative %: 54.9 % (ref 43.0–77.0)
Platelets: 125 10*3/uL — ABNORMAL LOW (ref 150.0–400.0)
RBC: 4.33 Mil/uL (ref 4.22–5.81)
RDW: 13.6 % (ref 11.5–15.5)
WBC: 5.5 10*3/uL (ref 4.0–10.5)

## 2017-06-21 LAB — PROTIME-INR
INR: 1.1 ratio — ABNORMAL HIGH (ref 0.8–1.0)
Prothrombin Time: 12.4 s (ref 9.6–13.1)

## 2017-06-21 NOTE — Progress Notes (Signed)
Labs are ok He is cleared for his knee surgery   No special precautions needed due to hs liver disease in my opinion  There is a form in front of my computer - we can check the medically cleared box and fax that back with my stamped signature and also print and copy this note

## 2017-06-25 ENCOUNTER — Ambulatory Visit (INDEPENDENT_AMBULATORY_CARE_PROVIDER_SITE_OTHER): Payer: PPO | Admitting: Orthopaedic Surgery

## 2017-06-25 ENCOUNTER — Encounter (INDEPENDENT_AMBULATORY_CARE_PROVIDER_SITE_OTHER): Payer: Self-pay | Admitting: Orthopaedic Surgery

## 2017-06-25 VITALS — BP 130/72 | HR 84 | Resp 14 | Ht 74.0 in | Wt 200.0 lb

## 2017-06-25 DIAGNOSIS — M5440 Lumbago with sciatica, unspecified side: Secondary | ICD-10-CM | POA: Diagnosis not present

## 2017-06-25 MED ORDER — HYDROCODONE-ACETAMINOPHEN 5-325 MG PO TABS: 1.0000 | ORAL_TABLET | Freq: Two times a day (BID) | ORAL | 0 refills | Status: DC | PRN

## 2017-06-25 NOTE — Progress Notes (Signed)
Office Visit Note   Patient: Joseph [name]           Date of Birth: 04-Apr-1941           MRN: [id number] Visit Date: 06/25/2017              Requested by: [name], MD [address] [place], [place] [address] PCP: [name], MD   Assessment & Plan: Visit Diagnoses:  1. Low back pain with sciatica, sciatica laterality unspecified, unspecified back pain laterality, unspecified chronicity     Plan: MRI L-S spine,Hydrocodone for pain. Office after the scan  Follow-Up Instructions: Return after MRI L-S spine.   Orders:  Orders Placed This Encounter  Procedures  . MR Lumbar Spine w/o contrast   Meds ordered this encounter  Medications  . HYDROcodone-acetaminophen (NORCO/VICODIN) 5-325 MG tablet    Sig: Take 1 tablet by mouth every 12 (twelve) hours as needed for moderate pain.    Dispense:  30 tablet    Refill:  0      Procedures: No procedures performed   Clinical Data: No additional findings.   Subjective: Chief Complaint  Patient presents with  . Lower Back - Pain  Joseph [name] is been experiencing recurrent episodes of low back pain for well over a year. He had films in June 2018 demonstrating considerable degenerative changes at the facet joints. He's had a course of physical therapy. I did inject the area of tenderness in the parasacral region that gave him some temporary relief of some of his pain. He recently was working in the yard and has had an exacerbation of his pain to the point of activity limitation. Had some referred pain to the right groin and some pain in his right thigh  HPI  Review of Systems  Constitutional: Negative for fatigue.  HENT: Negative for hearing loss.   Respiratory: Positive for shortness of breath. Negative for apnea and chest tightness.   Cardiovascular: Negative for chest pain, palpitations and leg swelling.  Gastrointestinal: Negative for blood in stool, constipation and diarrhea.  Genitourinary: Negative for  difficulty urinating.  Musculoskeletal: Positive for back pain. Negative for arthralgias, joint swelling, myalgias, neck pain and neck stiffness.  Neurological: Negative for weakness, numbness and headaches.  Hematological: Does not bruise/bleed easily.  Psychiatric/Behavioral: Negative for sleep disturbance. The patient is not nervous/anxious.      Objective: Vital Signs: BP 130/72   Pulse 84   Resp 14   Ht 6\' 2"  (1.88 m)   Wt 200 lb (90.7 kg)   BMI 25.68 kg/m   Physical Exam  Ortho Exam awake alert and oriented 3. Comfortable sitting. Does have some mild tenderness without mass formation in the right parasacral region. Some mild percussible tenderness at the lumbosacral junction. Straight leg raise is negative. His have bilateral knee osteoarthritis and is in the midst of evaluation for left total knee replacement. no swelling distally. Walks with a very stiff back with inability to forward bend much beyond his mid  Specialty Comments:  No specialty comments available.  Imaging: No results found.   PMFS History: Patient Active Problem List   Diagnosis Date Noted  . SBO (small bowel obstruction) ([place]) 03/18/2017  . Elevated d-dimer   . Hypoxia   . SOB (shortness of breath)   . Small bowel obstruction ([place]) 10/20/2016  . AKI (acute kidney injury) ([place]) 10/20/2016  . Acute respiratory failure with hypoxia ([place]) 10/20/2016  . Chronic pain of left knee 07/06/2016  . Port catheter  in place 02/27/2016  . Claudication ([place]) 02/18/2016  . Statin intolerance 02/18/2016  . Essential hypertension 02/18/2016  . Coronary artery disease due to lipid rich plaque 08/29/2015  . Acute on chronic diastolic CHF (congestive heart failure), NYHA class 2 ([place]) 08/29/2015  . 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 Hypoxemia requiring supplemental oxygen   . Atrial fibrillation ([place]) 07/23/2014  . COPD (chronic obstructive pulmonary disease)  ([place]) 07/06/2014  . Hypothyroidism 07/06/2014  . Numbness-Left Thigh 12/14/2013  . AAA- s/p Ao-Iliac BPG March 2015 10/20/2013  . Hyperlipidemia 10/09/2013  . Hepatic cirrhosis ([place]) 12/02/2012  . Cancer of right lung ([place]) 08/06/2011   Past Medical History:  Diagnosis Date  . Acute on chronic diastolic CHF (congestive heart failure), NYHA class 3 ([place]) 07/23/2014  . Arthritis    left knee and back arthrtis  . Atrial fibrillation with rapid ventricular response ([place]) 07/04/2014  . CAD (coronary artery disease)   . Cataract   . Cirrhosis ([place])    By radiography, seen on CT  . Diverticulosis   . Emphysema   . Enlarged prostate   . Heart murmur    teen  . History of colon polyps 2006,2008  . History of transfusion 2015  . Hx of radiation therapy 09/02/11 to 10/19/11   chest  . Hx of radiation therapy   . Hyperlipidemia    takes Pravastatin  . Hypothyroidism    takes Synthroid daily  . Lung cancer ([place])    right upper lobe  . Personal history of adenomatous colonic polyps 07/04/2012   2006, adenomatous right colon polyp removed, then ablated in 2007 followup, 2008, adenomatous polyp of the right colon removed with hot biopsy forceps. All by Dr. Lajoyce Corners 01/03/2013 diminutive polyp ascending colon - adenoma    . Rosacea   . Shortness of breath    with exertion  . Small bowel obstruction ([place]) 07/05/2014  . Status post chemotherapy    last chemo 2013  . Umbilical hernia, incarcerated- surgery 07/10/14 07/05/2014    Family History  Problem Relation Age of Onset  . Cancer Father   . Diabetes Father   . Hyperlipidemia Mother   . Hypertension Mother   . Hyperlipidemia Brother   . AAA (abdominal aortic aneurysm) Brother   . Hypertension Brother   . Hyperlipidemia Sister   . Hypertension Sister   . Anesthesia problems Neg Hx   . Hypotension Neg Hx   . Malignant hyperthermia Neg Hx   . Pseudochol deficiency Neg Hx   . Colon cancer Neg Hx     Past Surgical History:  Procedure  Laterality Date  . ABDOMINAL AORTAGRAM  10/20/2013   Procedure: ABDOMINAL Maxcine Ham;  Surgeon: Elam Dutch, MD;  Location: [facility];  Service: Cardiovascular;;  . ABDOMINAL AORTIC ANEURYSM REPAIR N/A 10/31/2013   Procedure: aorto to left external iliac and right external iliac and left internal iliac, reimplantation of inferior mesenteric artery and ligation of left iliac artery aneursym;  Surgeon: Elam Dutch, MD;  Location: [facility];  Service: Vascular;  Laterality: N/A;  . ABDOMINAL WOUND DEHISCENCE N/A 11/10/2013   Procedure: ABDOMINAL WOUND CLOSURE ;  Surgeon: Serafina Mitchell, MD;  Location: [facility];  Service: Vascular;  Laterality: N/A;  . bletharoplasty    . BOWEL RESECTION N/A 07/10/2014   Procedure: SMALL BOWEL RESECTION;  Surgeon: [name], MD;  Location: [facility];  Service: General;  Laterality: N/A;  . CARDIAC CATHETERIZATION  2003  . COLONOSCOPY W/ BIOPSIES     multiple   . ESOPHAGOGASTRODUODENOSCOPY  2006  . FINGER SURGERY  2008   left pointer finger  . HERNIA REPAIR  70's  . INCISIONAL HERNIA REPAIR N/A 07/10/2014   Procedure: HERNIA REPAIR INCISIONAL;  Surgeon: [name], MD;  Location: [facility];  Service: General;  Laterality: N/A;  . KNEE ARTHROSCOPY  2008   left knee  . LAPAROTOMY N/A 07/10/2014   Procedure: EXPLORATORY LAPAROTOMY;  Surgeon: [name], MD;  Location: [facility];  Service: General;  Laterality: N/A;  . LOWER EXTREMITY ANGIOGRAM Bilateral 10/20/2013   Procedure: LOWER EXTREMITY ANGIOGRAM;  Surgeon: Elam Dutch, MD;  [facility];  Service: Cardiovascular;  Laterality: Bilateral;  . LYSIS OF ADHESION N/A 07/10/2014   Procedure: LYSIS OF ADHESION - 90 minutes;  Surgeon: [name], MD;  Location: [facility];  Service: General;  Laterality: N/A;  . PORTACATH PLACEMENT  08/10/2011   Procedure: INSERTION PORT-A-CATH;  Surgeon: Pierre Bali, MD;  Location: [facility];  Service: Thoracic;  Laterality: Left;  Left Subclavian  . PTOSIS REPAIR  Bilateral 12-02-2015  . THROMBECTOMY ILIAC ARTERY Bilateral 10/31/2013   Procedure: THROMBECTOMY ILIAC ARTERY;  Surgeon: Elam Dutch, MD;  Location: [facility];  Service: Vascular;  Laterality: Bilateral;  . TONSILLECTOMY     as a child  . VASECTOMY  70's  . VENTRAL HERNIA REPAIR N/A 04/04/2015   Procedure: OPEN REPAIR OF RECURRENT ABDOMINAL WALL HERNIA WITH MESH, COMPLEX LYSIS OF ADHESIONS X1 HR TAR COMPONENT SEPARATION;  Surgeon: Greer Pickerel, MD;  Location: [facility];  Service: General;  Laterality: N/A;   Social History   Occupational History  . Not on file.   Social History Main Topics  . Smoking status: Former Smoker    Packs/day: 1.50    Years: 40.00    Types: Cigarettes    Quit date: 07/02/2011  . Smokeless tobacco: Former Systems developer  . Alcohol use 0.0 oz/week     Comment: social  . Drug use: No  . Sexual activity: No

## 2017-07-10 ENCOUNTER — Ambulatory Visit
Admission: RE | Admit: 2017-07-10 | Discharge: 2017-07-10 | Disposition: A | Discharge status: Home / Self Care | Payer: PPO | Source: Ambulatory Visit | Attending: Orthopaedic Surgery | Admitting: Orthopaedic Surgery

## 2017-07-10 DIAGNOSIS — M48061 Spinal stenosis, lumbar region without neurogenic claudication: Secondary | ICD-10-CM | POA: Diagnosis not present

## 2017-07-10 DIAGNOSIS — M5440 Lumbago with sciatica, unspecified side: Secondary | ICD-10-CM

## 2017-07-13 ENCOUNTER — Other Ambulatory Visit (INDEPENDENT_AMBULATORY_CARE_PROVIDER_SITE_OTHER): Payer: Self-pay | Admitting: Orthopaedic Surgery

## 2017-07-13 DIAGNOSIS — M4726 Other spondylosis with radiculopathy, lumbar region: Secondary | ICD-10-CM

## 2017-07-13 NOTE — Progress Notes (Signed)
Needs referral to Dr Ernestina Patches for [facility]

## 2017-07-16 ENCOUNTER — Ambulatory Visit (HOSPITAL_BASED_OUTPATIENT_CLINIC_OR_DEPARTMENT_OTHER): Payer: PPO

## 2017-07-16 VITALS — BP 140/89 | HR 89 | Temp 97.9°F | Resp 18

## 2017-07-16 DIAGNOSIS — Z452 Encounter for adjustment and management of vascular access device: Secondary | ICD-10-CM | POA: Diagnosis not present

## 2017-07-16 DIAGNOSIS — C3411 Malignant neoplasm of upper lobe, right bronchus or lung: Secondary | ICD-10-CM | POA: Diagnosis not present

## 2017-07-16 DIAGNOSIS — Z95828 Presence of other vascular implants and grafts: Secondary | ICD-10-CM

## 2017-07-16 MED ORDER — HEPARIN SOD (PORK) LOCK FLUSH 100 UNIT/ML IV SOLN
500.0000 [IU] | Freq: Once | INTRAVENOUS | Status: AC | PRN
  Administered 2017-07-16: 500 [IU] via INTRAVENOUS
  Filled 2017-07-16: qty 5

## 2017-07-16 MED ORDER — SODIUM CHLORIDE 0.9 % IJ SOLN
10.0000 mL | INTRAMUSCULAR | Status: DC | PRN
  Administered 2017-07-16: 10 mL via INTRAVENOUS
  Filled 2017-07-16: qty 10

## 2017-07-16 NOTE — Patient Instructions (Signed)
Implanted Port Home Guide An implanted port is a type of central line that is placed under the skin. Central lines are used to provide IV access when treatment or nutrition needs to be given through a person's veins. Implanted ports are used for long-term IV access. An implanted port may be placed because:  You need IV medicine that would be irritating to the small veins in your hands or arms.  You need long-term IV medicines, such as antibiotics.  You need IV nutrition for a long period.  You need frequent blood draws for lab tests.  You need dialysis.  Implanted ports are usually placed in the chest area, but they can also be placed in the upper arm, the abdomen, or the leg. An implanted port has two main parts:  Reservoir. The reservoir is round and will appear as a small, raised area under your skin. The reservoir is the part where a needle is inserted to give medicines or draw blood.  Catheter. The catheter is a thin, flexible tube that extends from the reservoir. The catheter is placed into a large vein. Medicine that is inserted into the reservoir goes into the catheter and then into the vein.  How will I care for my incision site? Do not get the incision site wet. Bathe or shower as directed by your health care provider. How is my port accessed? Special steps must be taken to access the port:  Before the port is accessed, a numbing cream can be placed on the skin. This helps numb the skin over the port site.  Your health care provider uses a sterile technique to access the port. ? Your health care provider must put on a mask and sterile gloves. ? The skin over your port is cleaned carefully with an antiseptic and allowed to dry. ? The port is gently pinched between sterile gloves, and a needle is inserted into the port.  Only "non-coring" port needles should be used to access the port. Once the port is accessed, a blood return should be checked. This helps ensure that the port  is in the vein and is not clogged.  If your port needs to remain accessed for a constant infusion, a clear (transparent) bandage will be placed over the needle site. The bandage and needle will need to be changed every week, or as directed by your health care provider.  Keep the bandage covering the needle clean and dry. Do not get it wet. Follow your health care provider's instructions on how to take a shower or bath while the port is accessed.  If your port does not need to stay accessed, no bandage is needed over the port.  What is flushing? Flushing helps keep the port from getting clogged. Follow your health care provider's instructions on how and when to flush the port. Ports are usually flushed with saline solution or a medicine called heparin. The need for flushing will depend on how the port is used.  If the port is used for intermittent medicines or blood draws, the port will need to be flushed: ? After medicines have been given. ? After blood has been drawn. ? As part of routine maintenance.  If a constant infusion is running, the port may not need to be flushed.  How long will my port stay implanted? The port can stay in for as long as your health care provider thinks it is needed. When it is time for the port to come out, surgery will be   done to remove it. The procedure is similar to the one performed when the port was put in. When should I seek immediate medical care? When you have an implanted port, you should seek immediate medical care if:  You notice a bad smell coming from the incision site.  You have swelling, redness, or drainage at the incision site.  You have more swelling or pain at the port site or the surrounding area.  You have a fever that is not controlled with medicine.  This information is not intended to replace advice given to you by your health care provider. Make sure you discuss any questions you have with your health care provider. Document  Released: 08/17/2005 Document Revised: 01/23/2016 Document Reviewed: 04/24/2013 Elsevier Interactive Patient Education  2017 Elsevier Inc.  

## 2017-07-20 NOTE — Pre-Procedure Instructions (Signed)
Kenith Trickel Sima  07/20/2017      [id number] - [place], [place] [address] [place] [address] Phone: [phone] Fax: [phone]    Your procedure is scheduled on December 4  Report to [facility] at 0700 A.M.  Call this number if you have problems the morning of surgery:  [phone]   Remember:  Do not eat food or drink liquids after midnight.  Continue all medications as directed by your physician except follow these medication instructions before surgery below   Take these medicines the morning of surgery with A SIP OF WATER  albuterol (PROVENTIL HFA;VENTOLIN HFA) HYDROcodone-acetaminophen (NORCO/VICODIN) metoprolol tartrate (LOPRESSOR)  SYNTHROID  tiotropium (SPIRIVA)  7 days prior to surgery STOP taking any Aspirin(unless otherwise instructed by your surgeon), Aleve, Naproxen, Ibuprofen, Motrin, Advil, Goody's, BC's, all herbal medications, fish oil, and all vitamins  Follow your doctors instructions regarding your Aspirin.  If no instructions were given by your doctor, then you will need to call the prescribing office office to get instructions.     Do not wear jewelry  Do not wear lotions, powders, or cologne, or deoderant.  Men may shave face and neck.  Do not bring valuables to the hospital.  [facility] is not responsible for any belongings or valuables.  Contacts, dentures or bridgework may not be worn into surgery.  Leave your suitcase in the car.  After surgery it may be brought to your room.  For patients admitted to the hospital, discharge time will be determined by your treatment team.  Patients discharged the day of surgery will not be allowed to drive home.    Special instructions:   [facility]- Preparing For Surgery  Before surgery, you can play an important role. Because skin is not sterile, your skin needs to be as free of germs as  possible. You can reduce the number of germs on your skin by washing with CHG (chlorahexidine gluconate) Soap before surgery.  CHG is an antiseptic cleaner which kills germs and bonds with the skin to continue killing germs even after washing.  Please do not use if you have an allergy to CHG or antibacterial soaps. If your skin becomes reddened/irritated stop using the CHG.  Do not shave (including legs and underarms) for at least 48 hours prior to first CHG shower. It is OK to shave your face.  Please follow these instructions carefully.   1. Shower the NIGHT BEFORE SURGERY and the MORNING OF SURGERY with CHG.   2. If you chose to wash your hair, wash your hair first as usual with your normal shampoo.  3. After you shampoo, rinse your hair and body thoroughly to remove the shampoo.  4. Use CHG as you would any other liquid soap. You can apply CHG directly to the skin and wash gently with a scrungie or a clean washcloth.   5. Apply the CHG Soap to your body ONLY FROM THE NECK DOWN.  Do not use on open wounds or open sores. Avoid contact with your eyes, ears, mouth and genitals (private parts). Wash Face and genitals (private parts)  with your normal soap.  6. Wash thoroughly, paying special attention to the area where your surgery will be performed.  7. Thoroughly rinse your body with warm water from the neck down.  8. DO NOT shower/wash with your normal soap after using and rinsing off the CHG Soap.  9.  Pat yourself dry with a CLEAN TOWEL.  10. Wear CLEAN PAJAMAS to bed the night before surgery, wear comfortable clothes the morning of surgery  11. Place CLEAN SHEETS on your bed the night of your first shower and DO NOT SLEEP WITH PETS.    Day of Surgery: Do not apply any deodorants/lotions. Please wear clean clothes to the hospital/surgery center.      Please read over the following fact sheets that you were given.

## 2017-07-21 ENCOUNTER — Other Ambulatory Visit: Payer: Self-pay

## 2017-07-21 ENCOUNTER — Encounter (HOSPITAL_COMMUNITY)
Admission: RE | Admit: 2017-07-21 | Discharge: 2017-07-21 | Disposition: A | Discharge status: Home / Self Care | Payer: PPO | Source: Ambulatory Visit | Attending: Orthopaedic Surgery | Admitting: Orthopaedic Surgery

## 2017-07-21 ENCOUNTER — Encounter (HOSPITAL_COMMUNITY): Payer: Self-pay

## 2017-07-21 ENCOUNTER — Ambulatory Visit (HOSPITAL_COMMUNITY)
Admission: RE | Admit: 2017-07-21 | Discharge: 2017-07-21 | Disposition: A | Discharge status: Home / Self Care | Payer: PPO | Source: Ambulatory Visit | Attending: Orthopedic Surgery | Admitting: Orthopedic Surgery

## 2017-07-21 DIAGNOSIS — Z01818 Encounter for other preprocedural examination: Secondary | ICD-10-CM

## 2017-07-21 DIAGNOSIS — I5033 Acute on chronic diastolic (congestive) heart failure: Secondary | ICD-10-CM | POA: Insufficient documentation

## 2017-07-21 DIAGNOSIS — I714 Abdominal aortic aneurysm, without rupture: Secondary | ICD-10-CM | POA: Diagnosis not present

## 2017-07-21 DIAGNOSIS — C3491 Malignant neoplasm of unspecified part of right bronchus or lung: Secondary | ICD-10-CM | POA: Insufficient documentation

## 2017-07-21 DIAGNOSIS — J439 Emphysema, unspecified: Secondary | ICD-10-CM | POA: Diagnosis not present

## 2017-07-21 DIAGNOSIS — I119 Hypertensive heart disease without heart failure: Secondary | ICD-10-CM | POA: Diagnosis not present

## 2017-07-21 DIAGNOSIS — I700 Atherosclerosis of aorta: Secondary | ICD-10-CM | POA: Insufficient documentation

## 2017-07-21 DIAGNOSIS — J9601 Acute respiratory failure with hypoxia: Secondary | ICD-10-CM | POA: Insufficient documentation

## 2017-07-21 DIAGNOSIS — J449 Chronic obstructive pulmonary disease, unspecified: Secondary | ICD-10-CM | POA: Diagnosis not present

## 2017-07-21 DIAGNOSIS — E039 Hypothyroidism, unspecified: Secondary | ICD-10-CM | POA: Insufficient documentation

## 2017-07-21 DIAGNOSIS — N179 Acute kidney failure, unspecified: Secondary | ICD-10-CM | POA: Insufficient documentation

## 2017-07-21 DIAGNOSIS — R0902 Hypoxemia: Secondary | ICD-10-CM | POA: Insufficient documentation

## 2017-07-21 DIAGNOSIS — Z01812 Encounter for preprocedural laboratory examination: Secondary | ICD-10-CM | POA: Diagnosis not present

## 2017-07-21 DIAGNOSIS — Z95828 Presence of other vascular implants and grafts: Secondary | ICD-10-CM | POA: Insufficient documentation

## 2017-07-21 DIAGNOSIS — I4891 Unspecified atrial fibrillation: Secondary | ICD-10-CM | POA: Insufficient documentation

## 2017-07-21 DIAGNOSIS — I2510 Atherosclerotic heart disease of native coronary artery without angina pectoris: Secondary | ICD-10-CM | POA: Diagnosis not present

## 2017-07-21 HISTORY — DX: Headache, unspecified: R51.9

## 2017-07-21 HISTORY — DX: Unilateral inguinal hernia, without obstruction or gangrene, not specified as recurrent: K40.90

## 2017-07-21 HISTORY — DX: Headache: R51

## 2017-07-21 HISTORY — DX: Personal history of other diseases of the circulatory system: Z86.79

## 2017-07-21 LAB — URINALYSIS, ROUTINE W REFLEX MICROSCOPIC
Bilirubin Urine: NEGATIVE
Glucose, UA: NEGATIVE mg/dL
Hgb urine dipstick: NEGATIVE
Ketones, ur: NEGATIVE mg/dL
LEUKOCYTES UA: NEGATIVE
NITRITE: NEGATIVE
PROTEIN: NEGATIVE mg/dL
Specific Gravity, Urine: 1.027 (ref 1.005–1.030)
pH: 5 (ref 5.0–8.0)

## 2017-07-21 LAB — COMPREHENSIVE METABOLIC PANEL
ALK PHOS: 126 U/L (ref 38–126)
ALT: 27 U/L (ref 17–63)
ANION GAP: 8 (ref 5–15)
AST: 35 U/L (ref 15–41)
Albumin: 3.4 g/dL — ABNORMAL LOW (ref 3.5–5.0)
BUN: 17 mg/dL (ref 6–20)
CALCIUM: 9.1 mg/dL (ref 8.9–10.3)
CO2: 22 mmol/L (ref 22–32)
Chloride: 109 mmol/L (ref 101–111)
Creatinine, Ser: 0.92 mg/dL (ref 0.61–1.24)
GFR calc non Af Amer: >60 mL/min (ref >60)
Glucose, Bld: 73 mg/dL (ref 65–99)
Potassium: 4.2 mmol/L (ref 3.5–5.1)
SODIUM: 139 mmol/L (ref 135–145)
Total Bilirubin: 1.8 mg/dL — ABNORMAL HIGH (ref 0.3–1.2)
Total Protein: 6.9 g/dL (ref 6.5–8.1)

## 2017-07-21 LAB — CBC WITH DIFFERENTIAL/PLATELET
Basophils Absolute: 0 10*3/uL (ref 0.0–0.1)
Basophils Relative: 0 %
EOS ABS: 0.1 10*3/uL (ref 0.0–0.7)
EOS PCT: 2 %
HCT: 44.5 % (ref 39.0–52.0)
HEMOGLOBIN: 15.5 g/dL (ref 13.0–17.0)
LYMPHS ABS: 2.3 10*3/uL (ref 0.7–4.0)
Lymphocytes Relative: 38 %
MCH: 33.6 pg (ref 26.0–34.0)
MCHC: 34.8 g/dL (ref 30.0–36.0)
MCV: 96.5 fL (ref 78.0–100.0)
MONOS PCT: 8 %
Monocytes Absolute: 0.5 10*3/uL (ref 0.1–1.0)
Neutro Abs: 3.2 10*3/uL (ref 1.7–7.7)
Neutrophils Relative %: 52 %
PLATELETS: 114 10*3/uL — AB (ref 150–400)
RBC: 4.61 MIL/uL (ref 4.22–5.81)
RDW: 14.4 % (ref 11.5–15.5)
WBC: 6.1 10*3/uL (ref 4.0–10.5)

## 2017-07-21 LAB — PROTIME-INR
INR: 1.08
PROTHROMBIN TIME: 13.9 s (ref 11.4–15.2)

## 2017-07-21 LAB — APTT: aPTT: 33 seconds (ref 24–36)

## 2017-07-21 LAB — SURGICAL PCR SCREEN
MRSA, PCR: NEGATIVE
STAPHYLOCOCCUS AUREUS: NEGATIVE

## 2017-07-21 NOTE — Progress Notes (Signed)
PCP - Dr. Lutricia Feil Cardiologist - Dr. Meda Coffee  Chest x-ray - 07/21/2017 EKG - 05/21/2017 Stress Test - 2016 ECHO - 2015 Cardiac Cath - 2003  Sleep Study - patient denies  Aspirin Instructions: Patient instructed to contact his surgeons office for instructions when to stop  Anesthesia review: yes, cardiac hx  Patient denies shortness of breath, fever, cough and chest pain at PAT appointment   Patient verbalized understanding of instructions that were given to them at the PAT appointment. Patient was also instructed that they will need to review over the PAT instructions again at home before surgery.

## 2017-07-22 LAB — URINE CULTURE: Culture: NO GROWTH

## 2017-07-23 NOTE — Progress Notes (Signed)
Blood Bank called and stated pt's Type and screen will have to redrawn day of surgery and that pt does need to return next week sometime between Monday and Thursday to have several tubes of blood drawn to be sent out to another facility. He will need 1 red tube and 4 EDTAs drawn and sent to the Blood Bank. Will have our scheduler call him and make a lab appt.

## 2017-07-23 NOTE — Progress Notes (Addendum)
Anesthesia Chart Review:  Pt is a 76 year old male scheduled for L total knee arthroplasty on 08/03/2017 with [name], MD  - PCP is [name], MD  - Cardiologist is Ena Dawley, MD. Last office visit 05/21/17. Pt cleared for surgery by [facility] Kicks, NP  PMH includes:  CAD (mild by 2003 cath), CHF, atrial fibrillation, AAA (s/p repair 2015), hyperlipidemia, hypothyroidism, emphysema, lung cancer, cirrhosis. Former smoker. BMI 29.  - Hospitalized 7/19-22/18 for SBO, acute hypoxemic respiratory failure  - Hospitalized 2/20-25/18 for SBO, dehydration and AKI, acute respiratory failure with hypoxia  PSH includes:  - S/p open repair abdominal wall hernia 04/04/15. S/p exploratory laparotomy, small bowel resection for incarcerated umbilical hernia 17/79/39. S/p Aorto to left and right external iliac bypass, left internal iliac artery bypass, ligation right internal iliac aneurysm, reimplantation of inferior mesenteric artery. Thrombectomy of left and right iliac and femoral arteries 10/31/16  Medications include: Albuterol, ASA 81 mg, Lasix, metoprolol, Synthroid, Spiriva  BP 126/75   Pulse 71   Temp 36.7 C (Oral)   Resp 18   Ht 6' (1.829 m)   Wt 212 lb 8.4 oz (96.4 kg)   SpO2 96%   BMI 28.82 kg/m   Preoperative labs reviewed.   - Per blood bank, pt to get T&S day of surgery.  Pt is also to return to pre-admission testing prior to surgery to send off blood for antigen testing.   CXR 07/21/17:  1. Stable chest x-ray without evidence of active cardiopulmonary disease. 2. Persistent hyperinflation an biapical bullous emphysematous changes. Emphysema  3. Stable position of left subclavian approach port catheter. The catheter tip is likely within the azygos vein. 4.  Aortic Atherosclerosis  EKG 05/21/17: NSR  Nuclear stress test 02/07/15:   This is an intermediate risk study with a prior infarct in the inferior wall with mild periinfarct ischemia in the mid inferoseptal  wall.  LVEF is normal 56%, with hypokinesis of the inferior wall and hyperdynamic the rest of the ventricle.  - Dr. Meda Coffee commented on stress test "stress test is considered a low risk"  Echo 10/12/13:  - Left ventricle: The cavity size was normal. Systolicfunction was normal. The estimated ejection fraction wasin the range of 55% to 60%. Wall motion was normal; therewere no regional wall motion abnormalities. There was an increased relative contribution of atrial contraction toventricular filling. Doppler parameters are consistentwith abnormal left ventricular relaxation (grade 1diastolic dysfunction). - Aortic valve: Moderate diffuse thickening and calcification, consistent with sclerosis. Trivial regurgitation. - Pulmonary arteries: PA peak pressure: 24mm Hg (S). Impressions: The right ventricular systolic pressure was increased consistent with mild pulmonary hypertension.  Carotid duplex 08/10/13:  - Bilateral ICA velocities suggest <40% stenosis  Cardiac cath 12/02/01:  1. Normal left ventricular systolic function. 2. Mild but hemodynamically insignificant coronary artery disease (LM 20%, LAD 20%, RCA 30%)  If no changes, I anticipate pt can proceed with surgery as scheduled.   Willeen Cass, FNP-BC [facility]iology Phone: [phone] 07/23/2017 2:02 PM

## 2017-07-26 ENCOUNTER — Encounter (HOSPITAL_COMMUNITY)
Admission: RE | Admit: 2017-07-26 | Discharge: 2017-07-26 | Disposition: A | Discharge status: Home / Self Care | Payer: PPO | Source: Ambulatory Visit | Attending: Orthopaedic Surgery | Admitting: Orthopaedic Surgery

## 2017-07-26 DIAGNOSIS — M1712 Unilateral primary osteoarthritis, left knee: Secondary | ICD-10-CM | POA: Diagnosis not present

## 2017-07-28 ENCOUNTER — Ambulatory Visit (INDEPENDENT_AMBULATORY_CARE_PROVIDER_SITE_OTHER): Payer: PPO | Admitting: Orthopedic Surgery

## 2017-07-28 ENCOUNTER — Encounter (INDEPENDENT_AMBULATORY_CARE_PROVIDER_SITE_OTHER): Payer: Self-pay | Admitting: Orthopedic Surgery

## 2017-07-28 VITALS — BP 119/66 | HR 74 | Temp 98.0°F | Resp 18 | Ht 71.0 in | Wt 214.0 lb

## 2017-07-28 DIAGNOSIS — M1712 Unilateral primary osteoarthritis, left knee: Secondary | ICD-10-CM | POA: Diagnosis not present

## 2017-07-28 NOTE — H&P (Signed)
TOTAL KNEE ADMISSION H&P  Patient is being admitted for left total knee arthroplasty.  Subjective:  Chief Complaint:left knee pain.  HPI: Joseph [name], 76 y.o. male, has a history of pain and functional disability in the left knee due to arthritis and has failed non-surgical conservative treatments for greater than 12 weeks to includeNSAID's and/or analgesics, corticosteriod injections, viscosupplementation injections and activity modification.  Onset of symptoms was abrupt, starting 5 years ago with gradually worsening course since that time. The patient noted prior procedures on the knee to include  arthroscopy and menisectomy on the left knee(s).  Patient currently rates pain in the left knee(s) at 8 out of 10 with activity. Patient has night pain, worsening of pain with activity and weight bearing, pain that interferes with activities of daily living, pain with passive range of motion, crepitus and joint swelling.  Patient has evidence of subchondral cysts, subchondral sclerosis, periarticular osteophytes and joint space narrowing by imaging studies. There is no active infection.  Patient Active Problem List   Diagnosis Date Noted  . SBO (small bowel obstruction) ([place]) 03/18/2017  . Elevated d-dimer   . Hypoxia   . SOB (shortness of breath)   . Small bowel obstruction ([place]) 10/20/2016  . AKI (acute kidney injury) ([place]) 10/20/2016  . Acute respiratory failure with hypoxia ([place]) 10/20/2016  . Chronic pain of left knee 07/06/2016  . Port catheter in place 02/27/2016  . Claudication ([place]) 02/18/2016  . Statin intolerance 02/18/2016  . Essential hypertension 02/18/2016  . Coronary artery disease due to lipid rich plaque 08/29/2015  . Acute on chronic diastolic CHF (congestive heart failure), NYHA class 2 ([place]) 08/29/2015  . 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 Hypoxemia requiring supplemental oxygen   . Atrial fibrillation ([place])  07/23/2014  . COPD (chronic obstructive pulmonary disease) ([place]) 07/06/2014  . Hypothyroidism 07/06/2014  . Numbness-Left Thigh 12/14/2013  . AAA- s/p Ao-Iliac BPG March 2015 10/20/2013  . Hyperlipidemia 10/09/2013  . Hepatic cirrhosis ([place]) 12/02/2012  . Cancer of right lung ([place]) 08/06/2011   Past Medical History:  Diagnosis Date  . Acute on chronic diastolic CHF (congestive heart failure), NYHA class 3 ([place]) 07/23/2014  . Arthritis    left knee and back arthrtis  . Atrial fibrillation with rapid ventricular response ([place]) 07/04/2014  . CAD (coronary artery disease)   . Cataract   . Cirrhosis ([place])    By radiography, seen on CT  . Diverticulosis   . Emphysema   . Enlarged prostate   . Headache    when younger but not any longer  . Heart murmur    [age]  . History of abdominal aortic aneurysm (AAA)   . History of colon polyps 2006,2008  . History of transfusion 2015  . Hx of radiation therapy 09/02/11 to 10/19/11   chest  . Hx of radiation therapy   . Hyperlipidemia    takes Pravastatin  . Hypothyroidism    takes Synthroid daily  . Inguinal hernia   . Lung cancer ([place])    right upper lobe  . Personal history of adenomatous colonic polyps 07/04/2012   2006, adenomatous right colon polyp removed, then ablated in 2007 followup, 2008, adenomatous polyp of the right colon removed with hot biopsy forceps. All by Dr. Lajoyce Corners 01/03/2013 diminutive polyp ascending colon - adenoma    . Rosacea   . Shortness of breath    with exertion  . Small bowel obstruction ([place]) 07/05/2014  .  Status post chemotherapy    last chemo 2013  . Umbilical hernia, incarcerated- surgery 07/10/14 07/05/2014    Past Surgical History:  Procedure Laterality Date  . ABDOMINAL AORTAGRAM  10/20/2013   Procedure: ABDOMINAL Maxcine Ham;  Surgeon: Elam Dutch, MD;  Location: [facility] CATH LAB;  Service: Cardiovascular;;  . ABDOMINAL AORTIC ANEURYSM REPAIR N/A 10/31/2013   Procedure: aorto to left external iliac and right  external iliac and left internal iliac, reimplantation of inferior mesenteric artery and ligation of left iliac artery aneursym;  Surgeon: Elam Dutch, MD;  Location: [facility];  Service: Vascular;  Laterality: N/A;  . ABDOMINAL WOUND DEHISCENCE N/A 11/10/2013   Procedure: ABDOMINAL WOUND CLOSURE ;  Surgeon: Serafina Mitchell, MD;  Location: [facility];  Service: Vascular;  Laterality: N/A;  . bletharoplasty    . BOWEL RESECTION N/A 07/10/2014   Procedure: SMALL BOWEL RESECTION;  Surgeon: [name], MD;  Location: [facility];  Service: General;  Laterality: N/A;  . CARDIAC CATHETERIZATION  2003  . CATARACT EXTRACTION, BILATERAL    . COLONOSCOPY W/ BIOPSIES     multiple   . ESOPHAGOGASTRODUODENOSCOPY  2006  . FINGER SURGERY  2008   left pointer finger  . HERNIA REPAIR  70's   x3  . INCISIONAL HERNIA REPAIR N/A 07/10/2014   Procedure: HERNIA REPAIR INCISIONAL;  Surgeon: [name], MD;  Location: [facility];  Service: General;  Laterality: N/A;  . KNEE ARTHROSCOPY  2008   left knee  . LAPAROTOMY N/A 07/10/2014   Procedure: EXPLORATORY LAPAROTOMY;  Surgeon: [name], MD;  Location: [facility];  Service: General;  Laterality: N/A;  . LOWER EXTREMITY ANGIOGRAM Bilateral 10/20/2013   Procedure: LOWER EXTREMITY ANGIOGRAM;  Surgeon: Elam Dutch, MD;  Location: [facility] CATH LAB;  Service: Cardiovascular;  Laterality: Bilateral;  . LYSIS OF ADHESION N/A 07/10/2014   Procedure: LYSIS OF ADHESION - 90 minutes;  Surgeon: [name], MD;  Location: [place];  Service: General;  Laterality: N/A;  . PORTACATH PLACEMENT  08/10/2011   Procedure: INSERTION PORT-A-CATH;  Surgeon: Pierre Bali, MD;  Location: [facility];  Service: Thoracic;  Laterality: Left;  Left Subclavian  . PTOSIS REPAIR Bilateral 12-02-2015  . THROMBECTOMY ILIAC ARTERY Bilateral 10/31/2013   Procedure: THROMBECTOMY ILIAC ARTERY;  Surgeon: Elam Dutch, MD;  Location: [facility];  Service: Vascular;  Laterality: Bilateral;  . TONSILLECTOMY     as a  child  . VASECTOMY  70's  . VENTRAL HERNIA REPAIR N/A 04/04/2015   Procedure: OPEN REPAIR OF RECURRENT ABDOMINAL WALL HERNIA WITH MESH, COMPLEX LYSIS OF ADHESIONS X1 HR TAR COMPONENT SEPARATION;  Surgeon: Greer Pickerel, MD;  Location: [facility];  Service: General;  Laterality: N/A;    No current facility-administered medications for this encounter.    Current Outpatient Medications  Medication Sig Dispense Refill Last Dose  . albuterol (PROVENTIL HFA;VENTOLIN HFA) 108 (90 Base) MCG/ACT inhaler Inhale 1-2 puffs into the lungs every 6 (six) hours as needed for wheezing or shortness of breath.   Taking  . aspirin EC 81 MG tablet Take 1 tablet (81 mg total) by mouth daily. 90 tablet 3 Taking  . furosemide (LASIX) 20 MG tablet Take 1 tablet (20 mg total) by mouth daily. 90 tablet 3 Taking  . Garlic 361 MG TABS Take 2 tablets by mouth 2 (two) times daily.   Taking  . HYDROcodone-acetaminophen (NORCO/VICODIN) 5-325 MG tablet Take 1 tablet by mouth every 12 (twelve) hours as needed for  moderate pain. 30 tablet 0 Taking  . metoprolol tartrate (LOPRESSOR) 25 MG tablet TAKE 1 TABLET (25 MG TOTAL) BY MOUTH 2 (TWO) TIMES DAILY. 180 tablet 3 Taking  . metroNIDAZOLE (METROCREAM) 0.75 % cream Apply 1 application topically 2 (two) times daily as needed.   Taking  . Multiple Vitamin (MULTIVITAMIN WITH MINERALS) TABS tablet Take 1 tablet by mouth daily.   Taking  . Multiple Vitamins-Minerals (PRESERVISION AREDS 2+MULTI VIT PO) Take 1 capsule by mouth 2 (two) times daily.   Taking  . nystatin cream (MYCOSTATIN) Apply 1 application topically 2 (two) times daily as needed for dry skin.   Taking  . OVER THE COUNTER MEDICATION Take 2 tablets by mouth every other day. Medication:  Lung Support   Taking  . Probiotic Product (PROBIOTIC DAILY PO) Take 1 capsule by mouth daily. [place]  . Specialty Vitamins Products (PROSTATE PO) Take 1 tablet by mouth 2 (two) times daily.   Taking  . SYNTHROID 175 MCG tablet Take 175  mcg by mouth daily before breakfast.    Taking  . tiotropium (SPIRIVA) 18 MCG inhalation capsule Place 18 mcg into inhaler and inhale daily.    Taking  . triamcinolone cream (KENALOG) 0.1 % Apply 1 application topically 3 times/day as needed-between meals & bedtime (skin irriation).   Taking  . vitamin B-12 (CYANOCOBALAMIN) 1000 MCG tablet Take 1,000 mcg by mouth daily.   Taking   No Known Allergies  Social History   Tobacco Use  . Smoking status: Former Smoker    Packs/day: 1.50    Years: 40.00    Pack years: 60.00    Types: Cigarettes    Last attempt to quit: 07/02/2011    Years since quitting: 6.0  . Smokeless tobacco: Former Network engineer Use Topics  . Alcohol use: Yes    Comment: social - 2 MONTHLY    Family History  Problem Relation Age of Onset  . Cancer Father   . Diabetes Father   . Hyperlipidemia Mother   . Hypertension Mother   . Hyperlipidemia Brother   . AAA (abdominal aortic aneurysm) Brother   . Hypertension Brother   . Hyperlipidemia Sister   . Hypertension Sister   . Anesthesia problems Neg Hx   . Hypotension Neg Hx   . Malignant hyperthermia Neg Hx   . Pseudochol deficiency Neg Hx   . Colon cancer Neg Hx     Review of Systems  Constitutional: Positive for activity change.  HENT: Negative for sore throat.   Eyes: Negative for pain.  Respiratory: Negative for shortness of breath.   Cardiovascular: Negative for leg swelling.  Gastrointestinal: Negative for constipation.  Endocrine: Negative for cold intolerance.  Genitourinary: Positive for frequency.  Musculoskeletal: Positive for back pain and gait problem.  Skin: Negative for rash.  Allergic/Immunologic: Negative for food allergies.  Neurological: Positive for weakness. Negative for numbness.  Hematological: Does not bruise/bleed easily.  Psychiatric/Behavioral: Positive for sleep disturbance.   Objective:  Physical Exam  Constitutional: He is oriented to person, place, and time. He appears  well-developed and well-nourished.  HENT:  Head: Normocephalic and atraumatic.  Eyes: Conjunctivae and EOM are normal. Pupils are equal, round, and reactive to light.  Neck: Neck supple. No thyromegaly present.  Cardiovascular: Normal rate, regular rhythm and intact distal pulses.  No murmur heard. Respiratory: Effort normal and breath sounds normal.  GI: Soft. Bowel sounds are normal. There is no tenderness.  Neurological: He is alert and  oriented to person, place, and time. No cranial nerve deficit.  Skin: Skin is warm and dry.  Psychiatric: He has a normal mood and affect. His behavior is normal. Judgment and thought content normal.  Orthopedic exam reveals range of motion from 5 to 100. Does have crepitance with range of motion. Varus positioning of the knee with opening with valgus stress with good endpoint and no edema in the pretibial area.   Vital signs in last 24 hours: Temp:  [98 F (36.7 C)] 98 F (36.7 C) (11/28 1257) Pulse Rate:  [74] 74 (11/28 1257) Resp:  [18] 18 (11/28 1257) BP: (119)/(66) 119/66 (11/28 1257) Weight:  [214 lb (97.1 kg)] 214 lb (97.1 kg) (11/28 1257)  Labs:   Estimated body mass index is 29.85 kg/m as calculated from the following:   Height as of 07/28/17: 5\' 11"  (1.803 m).   Weight as of 07/28/17: 214 lb (97.1 kg).   Imaging Review Plain radiographs demonstrate moderate degenerative joint disease of the left knee(s). The overall alignment ismild varus. The bone quality appears to be good for age and reported activity level.  Assessment/Plan:  End stage arthritis, left knee   The patient history, physical examination, clinical judgment of the provider and imaging studies are consistent with end stage degenerative joint disease of the left knee(s) and total knee arthroplasty is deemed medically necessary. The treatment options including medical management, injection therapy arthroscopy and arthroplasty were discussed at length. The risks and  benefits of total knee arthroplasty were presented and reviewed. The risks due to aseptic loosening, infection, stiffness, patella tracking problems, thromboembolic complications and other imponderables were discussed. The patient acknowledged the explanation, agreed to proceed with the plan and consent was signed. Patient is being admitted for inpatient treatment for surgery, pain control, PT, OT, prophylactic antibiotics, VTE prophylaxis, progressive ambulation and ADL's and discharge planning. The patient is planning to be discharged home with home health services   Mike Craze. [place], [place] [phone]  07/28/2017 2:05 PM

## 2017-07-28 NOTE — Progress Notes (Signed)
Chief Complaint:left knee pain.  HPI: Joseph [name], 76 y.o. male, has a history of pain and functional disability in the left knee due to arthritis and has failed non-surgical conservative treatments for greater than 12 weeks to includeNSAID's and/or analgesics, corticosteriod injections, viscosupplementation injections and activity modification.  Onset of symptoms was abrupt, starting 5 years ago with gradually worsening course since that time. The patient noted prior procedures on the knee to include  arthroscopy and menisectomy on the left knee(s).  Patient currently rates pain in the left knee(s) at 8 out of 10 with activity. Patient has night pain, worsening of pain with activity and weight bearing, pain that interferes with activities of daily living, pain with passive range of motion, crepitus and joint swelling.  Patient has evidence of subchondral cysts, subchondral sclerosis, periarticular osteophytes and joint space narrowing by imaging studies. There is no active infection.  Patient Active Problem List   Diagnosis Date Noted  . SBO (small bowel obstruction) ([place]) 03/18/2017  . Elevated d-dimer   . Hypoxia   . SOB (shortness of breath)   . Small bowel obstruction ([place]) 10/20/2016  . AKI (acute kidney injury) ([place]) 10/20/2016  . Acute respiratory failure with hypoxia ([place]) 10/20/2016  . Chronic pain of left knee 07/06/2016  . Port catheter in place 02/27/2016  . Claudication ([place]) 02/18/2016  . Statin intolerance 02/18/2016  . Essential hypertension 02/18/2016  . Coronary artery disease due to lipid rich plaque 08/29/2015  . Acute on chronic diastolic CHF (congestive heart failure), NYHA class 2 ([place]) 08/29/2015  . 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 Hypoxemia requiring supplemental oxygen   . Atrial fibrillation ([place]) 07/23/2014  . COPD (chronic obstructive pulmonary disease) ([place]) 07/06/2014  . Hypothyroidism 07/06/2014   . Numbness-Left Thigh 12/14/2013  . AAA- s/p Ao-Iliac BPG March 2015 10/20/2013  . Hyperlipidemia 10/09/2013  . Hepatic cirrhosis ([place]) 12/02/2012  . Cancer of right lung ([place]) 08/06/2011   Past Medical History:  Diagnosis Date  . Acute on chronic diastolic CHF (congestive heart failure), NYHA class 3 ([place]) 07/23/2014  . Arthritis    left knee and back arthrtis  . Atrial fibrillation with rapid ventricular response ([place]) 07/04/2014  . CAD (coronary artery disease)   . Cataract   . Cirrhosis ([place])    By radiography, seen on CT  . Diverticulosis   . Emphysema   . Enlarged prostate   . Headache    when younger but not any longer  . Heart murmur    teen  . History of abdominal aortic aneurysm (AAA)   . History of colon polyps 2006,2008  . History of transfusion 2015  . Hx of radiation therapy 09/02/11 to 10/19/11   chest  . Hx of radiation therapy   . Hyperlipidemia    takes Pravastatin  . Hypothyroidism    takes Synthroid daily  . Inguinal hernia   . Lung cancer ([place])    right upper lobe  . Personal history of adenomatous colonic polyps 07/04/2012   2006, adenomatous right colon polyp removed, then ablated in 2007 followup, 2008, adenomatous polyp of the right colon removed with hot biopsy forceps. All by Dr. Lajoyce Corners 01/03/2013 diminutive polyp ascending colon - adenoma    . Rosacea   . Shortness of breath    with exertion  . Small bowel obstruction ([place]) 07/05/2014  . Status post chemotherapy    last chemo 2013  . Umbilical hernia, incarcerated- surgery 07/10/14  07/05/2014    Past Surgical History:  Procedure Laterality Date  . ABDOMINAL AORTAGRAM  10/20/2013   Procedure: ABDOMINAL Maxcine Ham;  Surgeon: Elam Dutch, MD;  Location[facility] CATH LAB;  Service: Cardiovascular;;  . ABDOMINAL AORTIC ANEURYSM REPAIR N/A 10/31/2013   Procedure: aorto to left external iliac and right external iliac and left internal iliac, reimplantation of inferior mesenteric artery and ligation of left  iliac artery aneursym;  Surgeon: Elam Dutch, MD;  Location: [place];  Service: Vascular;  Laterality: N/A;  . ABDOMINAL WOUND DEHISCENCE N/A 11/10/2013   Procedure: ABDOMINAL WOUND CLOSURE ;  Surgeon: Serafina Mitchell, MD;  Location: [place];  Service: Vascular;  Laterality: N/A;  . bletharoplasty    . BOWEL RESECTION N/A 07/10/2014   Procedure: SMALL BOWEL RESECTION;  Surgeon: [name], MD;  Location: [place];  Service: General;  Laterality: N/A;  . CARDIAC CATHETERIZATION  2003  . CATARACT EXTRACTION, BILATERAL    . COLONOSCOPY W/ BIOPSIES     multiple   . ESOPHAGOGASTRODUODENOSCOPY  2006  . FINGER SURGERY  2008   left pointer finger  . HERNIA REPAIR  70's   x3  . INCISIONAL HERNIA REPAIR N/A 07/10/2014   Procedure: HERNIA REPAIR INCISIONAL;  Surgeon: [name], MD;  Location: [place];  Service: General;  Laterality: N/A;  . KNEE ARTHROSCOPY  2008   left knee  . LAPAROTOMY N/A 07/10/2014   Procedure: EXPLORATORY LAPAROTOMY;  Surgeon: [name], MD;  Location: [place];  Service: General;  Laterality: N/A;  . LOWER EXTREMITY ANGIOGRAM Bilateral 10/20/2013   Procedure: LOWER EXTREMITY ANGIOGRAM;  Surgeon: Elam Dutch, MD;  Location: [facility] CATH LAB;  Service: Cardiovascular;  Laterality: Bilateral;  . LYSIS OF ADHESION N/A 07/10/2014   Procedure: LYSIS OF ADHESION - 90 minutes;  Surgeon: [name], MD;  Location: [place];  Service: General;  Laterality: N/A;  . PORTACATH PLACEMENT  08/10/2011   Procedure: INSERTION PORT-A-CATH;  Surgeon: Pierre Bali, MD;  Location: [place];  Service: Thoracic;  Laterality: Left;  Left Subclavian  . PTOSIS REPAIR Bilateral 12-02-2015  . THROMBECTOMY ILIAC ARTERY Bilateral 10/31/2013   Procedure: THROMBECTOMY ILIAC ARTERY;  Surgeon: Elam Dutch, MD;  Location: [place];  Service: Vascular;  Laterality: Bilateral;  . TONSILLECTOMY     as a child  . VASECTOMY  70's  . VENTRAL HERNIA REPAIR N/A 04/04/2015   Procedure: OPEN REPAIR OF RECURRENT  ABDOMINAL WALL HERNIA WITH MESH, COMPLEX LYSIS OF ADHESIONS X1 HR TAR COMPONENT SEPARATION;  Surgeon: Greer Pickerel, MD;  Location: [facility];  Service: General;  Laterality: N/A;    No current facility-administered medications for this encounter.    Current Outpatient Medications  Medication Sig Dispense Refill Last Dose  . albuterol (PROVENTIL HFA;VENTOLIN HFA) 108 (90 Base) MCG/ACT inhaler Inhale 1-2 puffs into the lungs every 6 (six) hours as needed for wheezing or shortness of breath.   Taking  . aspirin EC 81 MG tablet Take 1 tablet (81 mg total) by mouth daily. 90 tablet 3 Taking  . furosemide (LASIX) 20 MG tablet Take 1 tablet (20 mg total) by mouth daily. 90 tablet 3 Taking  . Garlic 510 MG TABS Take 2 tablets by mouth 2 (two) times daily.   Taking  . HYDROcodone-acetaminophen (NORCO/VICODIN) 5-325 MG tablet Take 1 tablet by mouth every 12 (twelve) hours as needed for moderate pain. 30 tablet 0 Taking  . metoprolol tartrate (LOPRESSOR) 25 MG tablet TAKE 1  TABLET (25 MG TOTAL) BY MOUTH 2 (TWO) TIMES DAILY. 180 tablet 3 Taking  . metroNIDAZOLE (METROCREAM) 0.75 % cream Apply 1 application topically 2 (two) times daily as needed.   Taking  . Multiple Vitamin (MULTIVITAMIN WITH MINERALS) TABS tablet Take 1 tablet by mouth daily.   Taking  . Multiple Vitamins-Minerals (PRESERVISION AREDS 2+MULTI VIT PO) Take 1 capsule by mouth 2 (two) times daily.   Taking  . nystatin cream (MYCOSTATIN) Apply 1 application topically 2 (two) times daily as needed for dry skin.   Taking  . OVER THE COUNTER MEDICATION Take 2 tablets by mouth every other day. Medication:  Lung Support   Taking  . Probiotic Product (PROBIOTIC DAILY PO) Take 1 capsule by mouth daily. [place]  . Specialty Vitamins Products (PROSTATE PO) Take 1 tablet by mouth 2 (two) times daily.   Taking  . SYNTHROID 175 MCG tablet Take 175 mcg by mouth daily before breakfast.    Taking  . tiotropium (SPIRIVA) 18 MCG inhalation capsule Place  18 mcg into inhaler and inhale daily.    Taking  . triamcinolone cream (KENALOG) 0.1 % Apply 1 application topically 3 times/day as needed-between meals & bedtime (skin irriation).   Taking  . vitamin B-12 (CYANOCOBALAMIN) 1000 MCG tablet Take 1,000 mcg by mouth daily.   Taking   No Known Allergies  Social History   Tobacco Use  . Smoking status: Former Smoker    Packs/day: 1.50    Years: 40.00    Pack years: 60.00    Types: Cigarettes    Last attempt to quit: 07/02/2011    Years since quitting: 6.0  . Smokeless tobacco: Former Network engineer Use Topics  . Alcohol use: Yes    Comment: social - 2 MONTHLY    Family History  Problem Relation Age of Onset  . Cancer Father   . Diabetes Father   . Hyperlipidemia Mother   . Hypertension Mother   . Hyperlipidemia Brother   . AAA (abdominal aortic aneurysm) Brother   . Hypertension Brother   . Hyperlipidemia Sister   . Hypertension Sister   . Anesthesia problems Neg Hx   . Hypotension Neg Hx   . Malignant hyperthermia Neg Hx   . Pseudochol deficiency Neg Hx   . Colon cancer Neg Hx     Review of Systems  Constitutional: Positive for activity change.  HENT: Negative for sore throat.   Eyes: Negative for pain.  Respiratory: Negative for shortness of breath.   Cardiovascular: Negative for leg swelling.  Gastrointestinal: Negative for constipation.  Endocrine: Negative for cold intolerance.  Genitourinary: Positive for frequency.  Musculoskeletal: Positive for back pain and gait problem.  Skin: Negative for rash.  Allergic/Immunologic: Negative for food allergies.  Neurological: Positive for weakness. Negative for numbness.  Hematological: Does not bruise/bleed easily.  Psychiatric/Behavioral: Positive for sleep disturbance.   Objective:  Physical Exam  Constitutional: He is oriented to person, place, and time. He appears well-developed and well-nourished.  HENT:  Head: Normocephalic and atraumatic.  Eyes: Conjunctivae  and EOM are normal. Pupils are equal, round, and reactive to light.  Neck: Neck supple. No thyromegaly present.  Cardiovascular: Normal rate, regular rhythm and intact distal pulses.  No murmur heard. Respiratory: Effort normal and breath sounds normal.  GI: Soft. Bowel sounds are normal. There is no tenderness.  Neurological: He is alert and oriented to person, place, and time. No cranial nerve deficit.  Skin: Skin is warm and  dry.  Psychiatric: He has a normal mood and affect. His behavior is normal. Judgment and thought content normal.  Orthopedic exam reveals range of motion from 5 to 100. Does have crepitance with range of motion. Varus positioning of the knee with opening with valgus stress with good endpoint and no edema in the pretibial area.   Vital signs in last 24 hours: Temp:  [98 F (36.7 C)] 98 F (36.7 C) (11/28 1257) Pulse Rate:  [74] 74 (11/28 1257) Resp:  [18] 18 (11/28 1257) BP: (119)/(66) 119/66 (11/28 1257) Weight:  [214 lb (97.1 kg)] 214 lb (97.1 kg) (11/28 1257)  Labs:   Estimated body mass index is 29.85 kg/m as calculated from the following:   Height as of 07/28/17: 5\' 11"  (1.803 m).   Weight as of 07/28/17: 214 lb (97.1 kg).   Imaging Review Plain radiographs demonstrate moderate degenerative joint disease of the left knee(s). The overall alignment ismild varus. The bone quality appears to be good for age and reported activity level.  Assessment/Plan:  End stage arthritis, left knee   The patient history, physical examination, clinical judgment of the provider and imaging studies are consistent with end stage degenerative joint disease of the left knee(s) and total knee arthroplasty is deemed medically necessary. The treatment options including medical management, injection therapy arthroscopy and arthroplasty were discussed at length. The risks and benefits of total knee arthroplasty were presented and reviewed. The risks due to aseptic loosening,  infection, stiffness, patella tracking problems, thromboembolic complications and other imponderables were discussed. The patient acknowledged the explanation, agreed to proceed with the plan and consent was signed. Patient is being admitted for inpatient treatment for surgery, pain control, PT, OT, prophylactic antibiotics, VTE prophylaxis, progressive ambulation and ADL's and discharge planning. The patient is planning to be discharged home with home health services   Face-to-face time spent with patient was greater than 40 minutes.  Greater than 50% of the time was spent in counseling and coordination of care. Mike Craze [place], [place] [phone]  07/28/2017 2:05 PM

## 2017-07-28 NOTE — Progress Notes (Deleted)
Office Visit Note   Patient: Joseph [name]           Date of Birth: [birth year]           MRN: [id number] Visit Date: 07/28/2017              Requested by: [name], MD [address] [place], [place] [address] PCP: [name], MD   Assessment & Plan: Visit Diagnoses: No diagnosis found.  Plan: ***  Follow-Up Instructions: No Follow-up on file.   Orders:  No orders of the defined types were placed in this encounter.  No orders of the defined types were placed in this encounter.     Procedures: No procedures performed   Clinical Data: No additional findings.   Subjective: Chief Complaint  Patient presents with  . Left Knee - Pre-op Exam  . Knee Pain    Left total knee, 08/03/17    HPI  Review of Systems  Constitutional: Positive for activity change.  HENT: Negative for sore throat.   Eyes: Negative for pain.  Respiratory: Negative for shortness of breath.   Cardiovascular: Negative for leg swelling.  Gastrointestinal: Negative for constipation.  Endocrine: Negative for cold intolerance.  Genitourinary: Positive for frequency.  Musculoskeletal: Positive for back pain and gait problem.  Skin: Negative for rash.  Allergic/Immunologic: Negative for food allergies.  Neurological: Positive for weakness. Negative for numbness.  Hematological: Does not bruise/bleed easily.  Psychiatric/Behavioral: Positive for sleep disturbance.     Objective: Vital Signs: BP 119/66 (BP Location: Right Arm, Patient Position: Sitting, Cuff Size: Normal)   Pulse 74   Temp 98 F (36.7 C)   Resp 18   Ht 5\' 11"  (1.803 m)   Wt 214 lb (97.1 kg)   BMI 29.85 kg/m   Physical Exam  Ortho Exam  Specialty Comments:  No specialty comments available.  Imaging: No results found.   PMFS History: Patient Active Problem List   Diagnosis Date Noted  . SBO (small bowel obstruction) ([place]) 03/18/2017  . Elevated d-dimer   . Hypoxia   . SOB (shortness of breath)     . Small bowel obstruction ([place]) 10/20/2016  . AKI (acute kidney injury) ([place]) 10/20/2016  . Acute respiratory failure with hypoxia ([place]) 10/20/2016  . Chronic pain of left knee 07/06/2016  . Port catheter in place 02/27/2016  . Claudication ([place]) 02/18/2016  . Statin intolerance 02/18/2016  . Essential hypertension 02/18/2016  . Coronary artery disease due to lipid rich plaque 08/29/2015  . Acute on chronic diastolic CHF (congestive heart failure), NYHA class 2 ([place]) 08/29/2015  . 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 Hypoxemia requiring supplemental oxygen   . Atrial fibrillation ([place]) 07/23/2014  . COPD (chronic obstructive pulmonary disease) ([place]) 07/06/2014  . Hypothyroidism 07/06/2014  . Numbness-Left Thigh 12/14/2013  . AAA- s/p Ao-Iliac BPG March 2015 10/20/2013  . Hyperlipidemia 10/09/2013  . Hepatic cirrhosis ([place]) 12/02/2012  . Cancer of right lung ([place]) 08/06/2011   Past Medical History:  Diagnosis Date  . Acute on chronic diastolic CHF (congestive heart failure), NYHA class 3 ([place]) 07/23/2014  . Arthritis    left knee and back arthrtis  . Atrial fibrillation with rapid ventricular response ([place]) 07/04/2014  . CAD (coronary artery disease)   . Cataract   . Cirrhosis ([place])    By radiography, seen on CT  . Diverticulosis   . Emphysema   . Enlarged prostate   . Headache  when younger but not any longer  . Heart murmur    teen  . History of abdominal aortic aneurysm (AAA)   . History of colon polyps 2006,2008  . History of transfusion 2015  . Hx of radiation therapy 09/02/11 to 10/19/11   chest  . Hx of radiation therapy   . Hyperlipidemia    takes Pravastatin  . Hypothyroidism    takes Synthroid daily  . Inguinal hernia   . Lung cancer ([place])    right upper lobe  . Personal history of adenomatous colonic polyps 07/04/2012   2006, adenomatous right colon polyp removed, then ablated in 2007 followup, 2008,  adenomatous polyp of the right colon removed with hot biopsy forceps. All by Dr. Lajoyce Corners 01/03/2013 diminutive polyp ascending colon - adenoma    . Rosacea   . Shortness of breath    with exertion  . Small bowel obstruction ([place]) 07/05/2014  . Status post chemotherapy    last chemo 2013  . Umbilical hernia, incarcerated- surgery 07/10/14 07/05/2014    Family History  Problem Relation Age of Onset  . Cancer Father   . Diabetes Father   . Hyperlipidemia Mother   . Hypertension Mother   . Hyperlipidemia Brother   . AAA (abdominal aortic aneurysm) Brother   . Hypertension Brother   . Hyperlipidemia Sister   . Hypertension Sister   . Anesthesia problems Neg Hx   . Hypotension Neg Hx   . Malignant hyperthermia Neg Hx   . Pseudochol deficiency Neg Hx   . Colon cancer Neg Hx     Past Surgical History:  Procedure Laterality Date  . ABDOMINAL AORTAGRAM  10/20/2013   Procedure: ABDOMINAL Maxcine Ham;  Surgeon: Elam Dutch, MD;  Location[facility] CATH LAB;  Service: Cardiovascular;;  . ABDOMINAL AORTIC ANEURYSM REPAIR N/A 10/31/2013   Procedure: aorto to left external iliac and right external iliac and left internal iliac, reimplantation of inferior mesenteric artery and ligation of left iliac artery aneursym;  Surgeon: Elam Dutch, MD;  Location: [place];  Service: Vascular;  Laterality: N/A;  . ABDOMINAL WOUND DEHISCENCE N/A 11/10/2013   Procedure: ABDOMINAL WOUND CLOSURE ;  Surgeon: Serafina Mitchell, MD;  Location: [facility];  Service: Vascular;  Laterality: N/A;  . bletharoplasty    . BOWEL RESECTION N/A 07/10/2014   Procedure: SMALL BOWEL RESECTION;  Surgeon: [name], MD;  Location: [place];  Service: General;  Laterality: N/A;  . CARDIAC CATHETERIZATION  2003  . CATARACT EXTRACTION, BILATERAL    . COLONOSCOPY W/ BIOPSIES     multiple   . ESOPHAGOGASTRODUODENOSCOPY  2006  . FINGER SURGERY  2008   left pointer finger  . HERNIA REPAIR  70's   x3  . INCISIONAL HERNIA REPAIR N/A 07/10/2014    Procedure: HERNIA REPAIR INCISIONAL;  Surgeon: [name], MD;  Location: [place];  Service: General;  Laterality: N/A;  . KNEE ARTHROSCOPY  2008   left knee  . LAPAROTOMY N/A 07/10/2014   Procedure: EXPLORATORY LAPAROTOMY;  Surgeon: [name], MD;  Location: [place];  Service: General;  Laterality: N/A;  . LOWER EXTREMITY ANGIOGRAM Bilateral 10/20/2013   Procedure: LOWER EXTREMITY ANGIOGRAM;  Surgeon: Elam Dutch, MD;  Location: [facility] CATH LAB;  Service: Cardiovascular;  Laterality: Bilateral;  . LYSIS OF ADHESION N/A 07/10/2014   Procedure: LYSIS OF ADHESION - 90 minutes;  Surgeon: [name], MD;  Location: [facility];  Service: General;  Laterality: N/A;  . PORTACATH PLACEMENT  08/10/2011   Procedure: INSERTION PORT-A-CATH;  Surgeon: Pierre Bali, MD;  Location: [place];  Service: Thoracic;  Laterality: Left;  Left Subclavian  . PTOSIS REPAIR Bilateral 12-02-2015  . THROMBECTOMY ILIAC ARTERY Bilateral 10/31/2013   Procedure: THROMBECTOMY ILIAC ARTERY;  Surgeon: Elam Dutch, MD;  Location: [place];  Service: Vascular;  Laterality: Bilateral;  . TONSILLECTOMY     as a child  . VASECTOMY  70's  . VENTRAL HERNIA REPAIR N/A 04/04/2015   Procedure: OPEN REPAIR OF RECURRENT ABDOMINAL WALL HERNIA WITH MESH, COMPLEX LYSIS OF ADHESIONS X1 HR TAR COMPONENT SEPARATION;  Surgeon: Greer Pickerel, MD;  [facility];  Service: General;  Laterality: N/A;   Social History   Occupational History  . Not on file  Tobacco Use  . Smoking status: Former Smoker    Packs/day: 1.50    Years: 40.00    Pack years: 60.00    Types: Cigarettes    Last attempt to quit: 07/02/2011    Years since quitting: 6.0  . Smokeless tobacco: Former Network engineer and Sexual Activity  . Alcohol use: Yes    Comment: social - 2 MONTHLY  . Drug use: No  . Sexual activity: No

## 2017-07-29 ENCOUNTER — Ambulatory Visit (INDEPENDENT_AMBULATORY_CARE_PROVIDER_SITE_OTHER): Payer: PPO | Admitting: Physical Medicine and Rehabilitation

## 2017-07-29 ENCOUNTER — Ambulatory Visit (INDEPENDENT_AMBULATORY_CARE_PROVIDER_SITE_OTHER): Payer: PPO

## 2017-07-29 ENCOUNTER — Encounter (INDEPENDENT_AMBULATORY_CARE_PROVIDER_SITE_OTHER): Payer: Self-pay | Admitting: Physical Medicine and Rehabilitation

## 2017-07-29 VITALS — BP 117/71 | HR 77

## 2017-07-29 DIAGNOSIS — M5416 Radiculopathy, lumbar region: Secondary | ICD-10-CM | POA: Diagnosis not present

## 2017-07-29 MED ORDER — LIDOCAINE HCL (PF) 1 % IJ SOLN
2.0000 mL | Freq: Once | INTRAMUSCULAR | Status: AC
  Administered 2017-07-29: 2 mL

## 2017-07-29 MED ORDER — DEXAMETHASONE SODIUM PHOSPHATE 10 MG/ML IJ SOLN
15.0000 mg | Freq: Once | INTRAMUSCULAR | Status: AC
  Administered 2017-07-29: 15 mg

## 2017-07-29 NOTE — Patient Instructions (Signed)

## 2017-07-29 NOTE — Progress Notes (Deleted)
Mainly right side low back pain off and on for around 1 year and persistent pain since Feb. Occasionally radiates into right groin and down front of leg to knee. Describes pain down leg as a dull aggravating pain. No numbness or tingling. Pain is worse with bending. States he will sometimes have pain on the left side as well but does not radiate down left leg.

## 2017-07-30 ENCOUNTER — Telehealth: Payer: Self-pay | Admitting: *Deleted

## 2017-07-30 NOTE — Telephone Encounter (Signed)
Jennifer from the blood bank called. Patient is positive for Warm and Cold Auto Antibody. Patient is scheduled for surgery 08/03/17 TKR. 2 units of the least incompatible blood have been ordered for the day of the surgery. A new sample will be taken the day of the surgery. Patient's hemoglobin is good right now. If you need more units of blood ordered for the day of the surgery, please call Anderson Malta or Vaughan Basta at [phone].

## 2017-07-30 NOTE — Telephone Encounter (Signed)
thanks

## 2017-08-02 ENCOUNTER — Encounter (HOSPITAL_COMMUNITY): Payer: Self-pay | Admitting: Anesthesiology

## 2017-08-02 ENCOUNTER — Other Ambulatory Visit (INDEPENDENT_AMBULATORY_CARE_PROVIDER_SITE_OTHER): Payer: Self-pay

## 2017-08-02 MED ORDER — SODIUM CHLORIDE 0.9 % IV SOLN
2000.0000 mg | INTRAVENOUS | Status: AC
  Administered 2017-08-03: 2000 mg via TOPICAL
  Filled 2017-08-02 (×2): qty 20

## 2017-08-02 NOTE — Anesthesia Preprocedure Evaluation (Addendum)
Anesthesia Evaluation  Patient identified by MRN, date of birth, ID band Patient awake    Reviewed: Allergy & Precautions, NPO status , Patient's Chart, lab work & pertinent test results, reviewed documented beta blocker date and time   Airway Mallampati: II  TM Distance: >3 FB Neck ROM: Full    Dental  (+) Caps   Pulmonary shortness of breath, with exertion and Long-Term Oxygen Therapy, COPD,  COPD inhaler, former smoker,  Hx/o right lung Ca- S/P Chemo Rx    Pulmonary exam normal breath sounds clear to auscultation       Cardiovascular hypertension, Pt. on medications and Pt. on home beta blockers + CAD, + Peripheral Vascular Disease and +CHF  Normal cardiovascular exam+ dysrhythmias Atrial Fibrillation + Valvular Problems/Murmurs  Rhythm:Regular Rate:Normal  ECHO 10-12-13 reviewed. EF 55-60%  Myoview 02-07-15: low risk study; Clearance given by Dr. Meda Coffee  Non obstructive CAD, EF normal, mild pulmonary htn, Afib    Neuro/Psych  Headaches, negative psych ROS   GI/Hepatic negative GI ROS, (+) Cirrhosis       ,   Endo/Other  Hypothyroidism Hyperlipidemia Grave's disease  Renal/GU Renal InsufficiencyRenal disease  negative genitourinary   Musculoskeletal  (+) Arthritis , Osteoarthritis,  OA left knee   Abdominal   Peds  Hematology negative hematology ROS (+)   Anesthesia Other Findings   Reproductive/Obstetrics                           Anesthesia Physical Anesthesia Plan  ASA: III  Anesthesia Plan: Spinal   Post-op Pain Management:  Regional for Post-op pain   Induction:   PONV Risk Score and Plan: 2 and Ondansetron, Propofol infusion and Treatment may vary due to age or medical condition  Airway Management Planned: Natural Airway, Nasal Cannula and Simple Face Mask  Additional Equipment:   Intra-op Plan:   Post-operative Plan:   Informed Consent: I have reviewed the  patients History and Physical, chart, labs and discussed the procedure including the risks, benefits and alternatives for the proposed anesthesia with the patient or authorized representative who has indicated his/her understanding and acceptance.   Dental advisory given  Plan Discussed with: CRNA, Anesthesiologist and Surgeon  Anesthesia Plan Comments:        Anesthesia Quick Evaluation

## 2017-08-03 ENCOUNTER — Other Ambulatory Visit: Payer: Self-pay

## 2017-08-03 ENCOUNTER — Inpatient Hospital Stay (HOSPITAL_COMMUNITY): Payer: PPO | Admitting: Certified Registered Nurse Anesthetist

## 2017-08-03 ENCOUNTER — Inpatient Hospital Stay (HOSPITAL_COMMUNITY)
Admission: RE | Admit: 2017-08-03 | Discharge: 2017-08-05 | DRG: 470 | Disposition: A | Discharge status: Home Health | Payer: PPO | Source: Ambulatory Visit | Attending: Orthopaedic Surgery | Admitting: Orthopaedic Surgery

## 2017-08-03 ENCOUNTER — Inpatient Hospital Stay (HOSPITAL_COMMUNITY): Payer: PPO | Admitting: Emergency Medicine

## 2017-08-03 ENCOUNTER — Encounter (HOSPITAL_COMMUNITY)
Admission: RE | Disposition: A | Discharge status: Home Health | Payer: Self-pay | Source: Ambulatory Visit | Attending: Orthopaedic Surgery

## 2017-08-03 ENCOUNTER — Encounter (HOSPITAL_COMMUNITY): Payer: Self-pay | Admitting: *Deleted

## 2017-08-03 DIAGNOSIS — J449 Chronic obstructive pulmonary disease, unspecified: Secondary | ICD-10-CM | POA: Diagnosis present

## 2017-08-03 DIAGNOSIS — G8929 Other chronic pain: Secondary | ICD-10-CM | POA: Diagnosis present

## 2017-08-03 DIAGNOSIS — I5033 Acute on chronic diastolic (congestive) heart failure: Secondary | ICD-10-CM | POA: Diagnosis not present

## 2017-08-03 DIAGNOSIS — I5032 Chronic diastolic (congestive) heart failure: Secondary | ICD-10-CM | POA: Diagnosis present

## 2017-08-03 DIAGNOSIS — I2510 Atherosclerotic heart disease of native coronary artery without angina pectoris: Secondary | ICD-10-CM | POA: Diagnosis present

## 2017-08-03 DIAGNOSIS — Z923 Personal history of irradiation: Secondary | ICD-10-CM | POA: Diagnosis not present

## 2017-08-03 DIAGNOSIS — M1712 Unilateral primary osteoarthritis, left knee: Principal | ICD-10-CM | POA: Diagnosis present

## 2017-08-03 DIAGNOSIS — G8918 Other acute postprocedural pain: Secondary | ICD-10-CM | POA: Diagnosis not present

## 2017-08-03 DIAGNOSIS — M659 Synovitis and tenosynovitis, unspecified: Secondary | ICD-10-CM | POA: Diagnosis present

## 2017-08-03 DIAGNOSIS — I2583 Coronary atherosclerosis due to lipid rich plaque: Secondary | ICD-10-CM | POA: Diagnosis not present

## 2017-08-03 DIAGNOSIS — Z8679 Personal history of other diseases of the circulatory system: Secondary | ICD-10-CM | POA: Diagnosis not present

## 2017-08-03 DIAGNOSIS — Z9221 Personal history of antineoplastic chemotherapy: Secondary | ICD-10-CM

## 2017-08-03 DIAGNOSIS — Z79890 Hormone replacement therapy (postmenopausal): Secondary | ICD-10-CM

## 2017-08-03 DIAGNOSIS — Z85118 Personal history of other malignant neoplasm of bronchus and lung: Secondary | ICD-10-CM

## 2017-08-03 DIAGNOSIS — I4891 Unspecified atrial fibrillation: Secondary | ICD-10-CM | POA: Diagnosis present

## 2017-08-03 DIAGNOSIS — E039 Hypothyroidism, unspecified: Secondary | ICD-10-CM | POA: Diagnosis present

## 2017-08-03 DIAGNOSIS — I739 Peripheral vascular disease, unspecified: Secondary | ICD-10-CM | POA: Diagnosis not present

## 2017-08-03 DIAGNOSIS — Z7982 Long term (current) use of aspirin: Secondary | ICD-10-CM | POA: Diagnosis not present

## 2017-08-03 DIAGNOSIS — I110 Hypertensive heart disease with heart failure: Secondary | ICD-10-CM | POA: Diagnosis present

## 2017-08-03 DIAGNOSIS — Z96652 Presence of left artificial knee joint: Secondary | ICD-10-CM

## 2017-08-03 DIAGNOSIS — M7122 Synovial cyst of popliteal space [Baker], left knee: Secondary | ICD-10-CM | POA: Diagnosis not present

## 2017-08-03 DIAGNOSIS — Z8249 Family history of ischemic heart disease and other diseases of the circulatory system: Secondary | ICD-10-CM | POA: Diagnosis not present

## 2017-08-03 DIAGNOSIS — Z87891 Personal history of nicotine dependence: Secondary | ICD-10-CM

## 2017-08-03 DIAGNOSIS — E785 Hyperlipidemia, unspecified: Secondary | ICD-10-CM | POA: Diagnosis present

## 2017-08-03 HISTORY — PX: TOTAL KNEE ARTHROPLASTY: SHX125

## 2017-08-03 LAB — TYPE AND SCREEN
ABO/RH(D): O POS
Antibody Screen: POSITIVE

## 2017-08-03 SURGERY — ARTHROPLASTY, KNEE, TOTAL
Anesthesia: Spinal | Laterality: Left

## 2017-08-03 MED ORDER — METOPROLOL TARTRATE 25 MG PO TABS
25.0000 mg | ORAL_TABLET | Freq: Two times a day (BID) | ORAL | Status: DC
  Administered 2017-08-03 – 2017-08-05 (×3): 25 mg via ORAL
  Filled 2017-08-03 (×4): qty 1

## 2017-08-03 MED ORDER — ACETAMINOPHEN 10 MG/ML IV SOLN
1000.0000 mg | Freq: Once | INTRAVENOUS | Status: AC
  Administered 2017-08-03: 1000 mg via INTRAVENOUS
  Filled 2017-08-03: qty 100

## 2017-08-03 MED ORDER — BUPIVACAINE-EPINEPHRINE (PF) 0.25% -1:200000 IJ SOLN
INTRAMUSCULAR | Status: AC
  Filled 2017-08-03: qty 30

## 2017-08-03 MED ORDER — ONDANSETRON HCL 4 MG/2ML IJ SOLN: 4.0000 mg | Freq: Four times a day (QID) | INTRAMUSCULAR | Status: DC | PRN

## 2017-08-03 MED ORDER — FENTANYL CITRATE (PF) 250 MCG/5ML IJ SOLN
INTRAMUSCULAR | Status: DC | PRN
  Administered 2017-08-03: 50 ug via INTRAVENOUS

## 2017-08-03 MED ORDER — SODIUM CHLORIDE 0.9 % IR SOLN
Status: DC | PRN
  Administered 2017-08-03: 3000 mL

## 2017-08-03 MED ORDER — SODIUM CHLORIDE 0.9 % IV SOLN: INTRAVENOUS | Status: DC

## 2017-08-03 MED ORDER — BUPIVACAINE IN DEXTROSE 0.75-8.25 % IT SOLN
INTRATHECAL | Status: DC | PRN
  Administered 2017-08-03: 2 mL via INTRATHECAL

## 2017-08-03 MED ORDER — LEVOTHYROXINE SODIUM 75 MCG PO TABS
175.0000 ug | ORAL_TABLET | Freq: Every day | ORAL | Status: DC
  Administered 2017-08-05: 175 ug via ORAL
  Filled 2017-08-03 (×2): qty 1

## 2017-08-03 MED ORDER — OXYCODONE HCL 5 MG PO TABS
5.0000 mg | ORAL_TABLET | ORAL | Status: DC | PRN
Start: 2017-08-03 — End: 2017-08-05
  Administered 2017-08-03: 5 mg via ORAL
  Filled 2017-08-03: qty 1

## 2017-08-03 MED ORDER — ONDANSETRON HCL 4 MG PO TABS: 4.0000 mg | ORAL_TABLET | Freq: Four times a day (QID) | ORAL | Status: DC | PRN

## 2017-08-03 MED ORDER — SODIUM CHLORIDE 0.9 % IV SOLN
INTRAVENOUS | Status: DC
  Administered 2017-08-03: 19:00:00 via INTRAVENOUS

## 2017-08-03 MED ORDER — LACTATED RINGERS IV SOLN
INTRAVENOUS | Status: DC | PRN
  Administered 2017-08-03 (×2): via INTRAVENOUS

## 2017-08-03 MED ORDER — BISACODYL 10 MG RE SUPP: 10.0000 mg | Freq: Every day | RECTAL | Status: DC | PRN

## 2017-08-03 MED ORDER — METHOCARBAMOL 500 MG PO TABS
500.0000 mg | ORAL_TABLET | Freq: Four times a day (QID) | ORAL | Status: DC | PRN
  Administered 2017-08-03 – 2017-08-04 (×2): 500 mg via ORAL
  Filled 2017-08-03: qty 1

## 2017-08-03 MED ORDER — MIDAZOLAM HCL 2 MG/2ML IJ SOLN
INTRAMUSCULAR | Status: AC
  Filled 2017-08-03: qty 2

## 2017-08-03 MED ORDER — METRONIDAZOLE 0.75 % EX CREA: 1.0000 "application " | TOPICAL_CREAM | Freq: Two times a day (BID) | CUTANEOUS | Status: DC | PRN

## 2017-08-03 MED ORDER — ACETAMINOPHEN 10 MG/ML IV SOLN
1000.0000 mg | Freq: Four times a day (QID) | INTRAVENOUS | Status: AC
  Administered 2017-08-03 – 2017-08-04 (×3): 1000 mg via INTRAVENOUS
  Filled 2017-08-03 (×4): qty 100

## 2017-08-03 MED ORDER — CHLORHEXIDINE GLUCONATE 4 % EX LIQD: 60.0000 mL | Freq: Once | CUTANEOUS | Status: DC

## 2017-08-03 MED ORDER — PHENYLEPHRINE HCL 10 MG/ML IJ SOLN
INTRAVENOUS | Status: DC | PRN
  Administered 2017-08-03: 25 ug/min via INTRAVENOUS

## 2017-08-03 MED ORDER — CEFAZOLIN SODIUM-DEXTROSE 2-4 GM/100ML-% IV SOLN
2.0000 g | INTRAVENOUS | Status: AC
Start: 2017-08-03 — End: 2017-08-03
  Administered 2017-08-03: 2 g via INTRAVENOUS
  Filled 2017-08-03: qty 100

## 2017-08-03 MED ORDER — MENTHOL 3 MG MT LOZG: 1.0000 | LOZENGE | OROMUCOSAL | Status: DC | PRN

## 2017-08-03 MED ORDER — HYDROMORPHONE HCL 1 MG/ML IJ SOLN: 0.5000 mg | INTRAMUSCULAR | Status: DC | PRN

## 2017-08-03 MED ORDER — FUROSEMIDE 20 MG PO TABS
20.0000 mg | ORAL_TABLET | Freq: Every day | ORAL | Status: DC
  Administered 2017-08-05: 20 mg via ORAL
  Filled 2017-08-03 (×2): qty 1

## 2017-08-03 MED ORDER — ROPIVACAINE HCL 7.5 MG/ML IJ SOLN
INTRAMUSCULAR | Status: DC | PRN
  Administered 2017-08-03: 20 mL via PERINEURAL

## 2017-08-03 MED ORDER — METHOCARBAMOL 500 MG PO TABS
ORAL_TABLET | ORAL | Status: AC
  Administered 2017-08-03: 500 mg via ORAL
  Filled 2017-08-03: qty 1

## 2017-08-03 MED ORDER — METOCLOPRAMIDE HCL 5 MG PO TABS: 5.0000 mg | ORAL_TABLET | Freq: Three times a day (TID) | ORAL | Status: DC | PRN

## 2017-08-03 MED ORDER — PHENYLEPHRINE 40 MCG/ML (10ML) SYRINGE FOR IV PUSH (FOR BLOOD PRESSURE SUPPORT)
40.0000 ug | PREFILLED_SYRINGE | Freq: Once | INTRAVENOUS | Status: DC
  Administered 2017-08-03: 40 ug via INTRAVENOUS
  Filled 2017-08-03: qty 5

## 2017-08-03 MED ORDER — 0.9 % SODIUM CHLORIDE (POUR BTL) OPTIME
TOPICAL | Status: DC | PRN
  Administered 2017-08-03 (×2): 1000 mL

## 2017-08-03 MED ORDER — TIOTROPIUM BROMIDE MONOHYDRATE 18 MCG IN CAPS
18.0000 ug | ORAL_CAPSULE | Freq: Every day | RESPIRATORY_TRACT | Status: DC
  Administered 2017-08-04 – 2017-08-05 (×2): 18 ug via RESPIRATORY_TRACT
  Filled 2017-08-03: qty 5

## 2017-08-03 MED ORDER — MAGNESIUM CITRATE PO SOLN: 1.0000 | Freq: Once | ORAL | Status: DC | PRN

## 2017-08-03 MED ORDER — KETOROLAC TROMETHAMINE 15 MG/ML IJ SOLN
7.5000 mg | Freq: Four times a day (QID) | INTRAMUSCULAR | Status: AC
  Administered 2017-08-04 (×3): 7.5 mg via INTRAVENOUS
  Filled 2017-08-03 (×3): qty 1

## 2017-08-03 MED ORDER — VITAMIN B-12 1000 MCG PO TABS
1000.0000 ug | ORAL_TABLET | Freq: Every day | ORAL | Status: DC
  Administered 2017-08-04 – 2017-08-05 (×2): 1000 ug via ORAL
  Filled 2017-08-03 (×2): qty 1

## 2017-08-03 MED ORDER — ALUM & MAG HYDROXIDE-SIMETH 200-200-20 MG/5ML PO SUSP: 30.0000 mL | ORAL | Status: DC | PRN

## 2017-08-03 MED ORDER — ONDANSETRON HCL 4 MG/2ML IJ SOLN
INTRAMUSCULAR | Status: DC | PRN
  Administered 2017-08-03: 4 mg via INTRAVENOUS

## 2017-08-03 MED ORDER — ALBUTEROL SULFATE (2.5 MG/3ML) 0.083% IN NEBU: 2.5000 mg | INHALATION_SOLUTION | Freq: Four times a day (QID) | RESPIRATORY_TRACT | Status: DC | PRN

## 2017-08-03 MED ORDER — DOCUSATE SODIUM 100 MG PO CAPS
100.0000 mg | ORAL_CAPSULE | Freq: Two times a day (BID) | ORAL | Status: DC
  Administered 2017-08-04 – 2017-08-05 (×3): 100 mg via ORAL
  Filled 2017-08-03 (×4): qty 1

## 2017-08-03 MED ORDER — BUPIVACAINE-EPINEPHRINE 0.25% -1:200000 IJ SOLN
INTRAMUSCULAR | Status: DC | PRN
  Administered 2017-08-03: 30 mL

## 2017-08-03 MED ORDER — PHENOL 1.4 % MT LIQD: 1.0000 | OROMUCOSAL | Status: DC | PRN

## 2017-08-03 MED ORDER — OXYCODONE HCL 5 MG PO TABS
10.0000 mg | ORAL_TABLET | ORAL | Status: DC | PRN
  Administered 2017-08-03 – 2017-08-05 (×9): 10 mg via ORAL
  Filled 2017-08-03 (×9): qty 2

## 2017-08-03 MED ORDER — DIPHENHYDRAMINE HCL 12.5 MG/5ML PO ELIX: 12.5000 mg | ORAL_SOLUTION | ORAL | Status: DC | PRN

## 2017-08-03 MED ORDER — METHOCARBAMOL 1000 MG/10ML IJ SOLN
500.0000 mg | Freq: Four times a day (QID) | INTRAVENOUS | Status: DC | PRN
  Filled 2017-08-03: qty 5

## 2017-08-03 MED ORDER — CEFAZOLIN SODIUM-DEXTROSE 2-4 GM/100ML-% IV SOLN
2.0000 g | Freq: Four times a day (QID) | INTRAVENOUS | Status: AC
  Administered 2017-08-03 (×2): 2 g via INTRAVENOUS
  Filled 2017-08-03 (×2): qty 100

## 2017-08-03 MED ORDER — ADULT MULTIVITAMIN W/MINERALS CH
1.0000 | ORAL_TABLET | Freq: Every day | ORAL | Status: DC
  Administered 2017-08-04 – 2017-08-05 (×2): 1 via ORAL
  Filled 2017-08-03 (×2): qty 1

## 2017-08-03 MED ORDER — OXYCODONE HCL 5 MG PO TABS
ORAL_TABLET | ORAL | Status: AC
Start: 2017-08-03 — End: 2017-08-03
  Administered 2017-08-03: 14:00:00
  Filled 2017-08-03: qty 1

## 2017-08-03 MED ORDER — FENTANYL CITRATE (PF) 250 MCG/5ML IJ SOLN
INTRAMUSCULAR | Status: AC
  Filled 2017-08-03: qty 5

## 2017-08-03 MED ORDER — RIVAROXABAN 10 MG PO TABS
10.0000 mg | ORAL_TABLET | Freq: Every day | ORAL | Status: DC
  Administered 2017-08-04 – 2017-08-05 (×2): 10 mg via ORAL
  Filled 2017-08-03 (×2): qty 1

## 2017-08-03 MED ORDER — PHENYLEPHRINE 40 MCG/ML (10ML) SYRINGE FOR IV PUSH (FOR BLOOD PRESSURE SUPPORT)
PREFILLED_SYRINGE | INTRAVENOUS | Status: DC | PRN
  Administered 2017-08-03: 120 ug via INTRAVENOUS
  Administered 2017-08-03: 80 ug via INTRAVENOUS

## 2017-08-03 MED ORDER — POLYETHYLENE GLYCOL 3350 17 G PO PACK: 17.0000 g | PACK | Freq: Every day | ORAL | Status: DC | PRN

## 2017-08-03 MED ORDER — METOCLOPRAMIDE HCL 5 MG/ML IJ SOLN: 5.0000 mg | Freq: Three times a day (TID) | INTRAMUSCULAR | Status: DC | PRN

## 2017-08-03 MED ORDER — PROPOFOL 500 MG/50ML IV EMUL
INTRAVENOUS | Status: DC | PRN
  Administered 2017-08-03: 100 ug/kg/min via INTRAVENOUS

## 2017-08-03 MED ORDER — MIDAZOLAM HCL 2 MG/2ML IJ SOLN
INTRAMUSCULAR | Status: DC | PRN
  Administered 2017-08-03 (×2): 1 mg via INTRAVENOUS

## 2017-08-03 MED ORDER — SODIUM CHLORIDE 0.9 % IV SOLN
0.0000 ug/min | INTRAVENOUS | Status: DC
  Administered 2017-08-03: 30 ug/min via INTRAVENOUS
  Filled 2017-08-03: qty 1

## 2017-08-03 MED ORDER — PROSIGHT PO TABS
1.0000 | ORAL_TABLET | Freq: Two times a day (BID) | ORAL | Status: DC
  Administered 2017-08-04 – 2017-08-05 (×3): 1 via ORAL
  Filled 2017-08-03 (×6): qty 1

## 2017-08-03 MED ORDER — OXYCODONE HCL 5 MG PO TABS
ORAL_TABLET | ORAL | Status: AC
  Administered 2017-08-03: 10 mg via ORAL
  Filled 2017-08-03: qty 2

## 2017-08-03 SURGICAL SUPPLY — 61 items
BAG DECANTER FOR FLEXI CONT (MISCELLANEOUS) ×3 IMPLANT
BANDAGE ESMARK 6X9 LF (GAUZE/BANDAGES/DRESSINGS) ×1 IMPLANT
BLADE SAGITTAL 25.0X1.19X90 (BLADE) ×2 IMPLANT
BNDG CMPR 9X6 STRL LF SNTH (GAUZE/BANDAGES/DRESSINGS) ×1
BNDG ESMARK 6X9 LF (GAUZE/BANDAGES/DRESSINGS) ×2
BOWL SMART MIX CTS (DISPOSABLE) ×2 IMPLANT
CAP KNEE TOTAL 3 SIGMA ×1 IMPLANT
CEMENT HV SMART SET (Cement) ×4 IMPLANT
COVER SURGICAL LIGHT HANDLE (MISCELLANEOUS) ×2 IMPLANT
CUFF TOURNIQUET SINGLE 34IN LL (TOURNIQUET CUFF) ×2 IMPLANT
CUFF TOURNIQUET SINGLE 44IN (TOURNIQUET CUFF) IMPLANT
DECANTER SPIKE VIAL GLASS SM (MISCELLANEOUS) ×1 IMPLANT
DRAPE EXTREMITY T 121X128X90 (DRAPE) ×2 IMPLANT
DRAPE HALF SHEET 40X57 (DRAPES) ×4 IMPLANT
DRSG ADAPTIC 3X8 NADH LF (GAUZE/BANDAGES/DRESSINGS) ×2 IMPLANT
DRSG PAD ABDOMINAL 8X10 ST (GAUZE/BANDAGES/DRESSINGS) ×4 IMPLANT
DURAPREP 26ML APPLICATOR (WOUND CARE) ×4 IMPLANT
ELECT CAUTERY BLADE 6.4 (BLADE) ×2 IMPLANT
ELECT REM PT RETURN 9FT ADLT (ELECTROSURGICAL) ×2
ELECTRODE REM PT RTRN 9FT ADLT (ELECTROSURGICAL) ×1 IMPLANT
EVACUATOR 1/8 PVC DRAIN (DRAIN) IMPLANT
FACESHIELD WRAPAROUND (MASK) ×4 IMPLANT
FACESHIELD WRAPAROUND OR TEAM (MASK) ×2 IMPLANT
GAUZE SPONGE 4X4 12PLY STRL (GAUZE/BANDAGES/DRESSINGS) ×2 IMPLANT
GAUZE SPONGE 4X4 12PLY STRL LF (GAUZE/BANDAGES/DRESSINGS) ×1 IMPLANT
GLOVE BIOGEL PI IND STRL 8 (GLOVE) ×1 IMPLANT
GLOVE BIOGEL PI IND STRL 8.5 (GLOVE) ×1 IMPLANT
GLOVE BIOGEL PI INDICATOR 8 (GLOVE) ×1
GLOVE BIOGEL PI INDICATOR 8.5 (GLOVE) ×1
GLOVE ECLIPSE 8.0 STRL XLNG CF (GLOVE) ×4 IMPLANT
GLOVE ECLIPSE 8.5 STRL (GLOVE) ×4 IMPLANT
GOWN STRL REUS W/ TWL LRG LVL3 (GOWN DISPOSABLE) ×2 IMPLANT
GOWN STRL REUS W/TWL 2XL LVL3 (GOWN DISPOSABLE) ×2 IMPLANT
GOWN STRL REUS W/TWL LRG LVL3 (GOWN DISPOSABLE) ×4
HANDPIECE INTERPULSE COAX TIP (DISPOSABLE) ×2
KIT BASIN OR (CUSTOM PROCEDURE TRAY) ×2 IMPLANT
KIT ROOM TURNOVER OR (KITS) ×2 IMPLANT
MANIFOLD NEPTUNE II (INSTRUMENTS) ×2 IMPLANT
NEEDLE 22X1 1/2 (OR ONLY) (NEEDLE) ×2 IMPLANT
NS IRRIG 1000ML POUR BTL (IV SOLUTION) ×3 IMPLANT
PACK TOTAL JOINT (CUSTOM PROCEDURE TRAY) ×2 IMPLANT
PAD ABD 8X10 STRL (GAUZE/BANDAGES/DRESSINGS) ×1 IMPLANT
PAD ARMBOARD 7.5X6 YLW CONV (MISCELLANEOUS) ×3 IMPLANT
PAD CAST 4YDX4 CTTN HI CHSV (CAST SUPPLIES) ×1 IMPLANT
PADDING CAST COTTON 4X4 STRL (CAST SUPPLIES) ×2
PADDING CAST COTTON 6X4 STRL (CAST SUPPLIES) ×2 IMPLANT
SET HNDPC FAN SPRY TIP SCT (DISPOSABLE) ×1 IMPLANT
STAPLER VISISTAT 35W (STAPLE) ×1 IMPLANT
SUCTION FRAZIER HANDLE 10FR (MISCELLANEOUS) ×1
SUCTION TUBE FRAZIER 10FR DISP (MISCELLANEOUS) ×1 IMPLANT
SURGIFLO W/THROMBIN 8M KIT (HEMOSTASIS) IMPLANT
SUT BONE WAX W31G (SUTURE) ×2 IMPLANT
SUT ETHIBOND NAB CT1 #1 30IN (SUTURE) ×4 IMPLANT
SUT MNCRL AB 3-0 PS2 18 (SUTURE) ×2 IMPLANT
SUT VIC AB 0 CT1 27 (SUTURE) ×2
SUT VIC AB 0 CT1 27XBRD ANBCTR (SUTURE) ×1 IMPLANT
SYR CONTROL 10ML LL (SYRINGE) ×2 IMPLANT
TOWEL OR 17X24 6PK STRL BLUE (TOWEL DISPOSABLE) ×2 IMPLANT
TOWEL OR 17X26 10 PK STRL BLUE (TOWEL DISPOSABLE) ×2 IMPLANT
TRAY FOLEY BAG SILVER LF 16FR (SET/KITS/TRAYS/PACK) ×2 IMPLANT
WRAP KNEE MAXI GEL POST OP (GAUZE/BANDAGES/DRESSINGS) ×2 IMPLANT

## 2017-08-03 NOTE — H&P (Signed)
The recent History & Physical has been reviewed. I have personally examined the patient today. There is no interval change to the documented History & Physical. The patient would like to proceed with the procedure.  [name] 08/03/2017,  7:10 AM

## 2017-08-03 NOTE — Anesthesia Procedure Notes (Signed)
Spinal  Patient location during procedure: OR Start time: 08/03/2017 7:24 AM Staffing Anesthesiologist: Josephine Igo, MD Performed: anesthesiologist  Preanesthetic Checklist Completed: patient identified, site marked, surgical consent, pre-op evaluation, timeout performed, IV checked, risks and benefits discussed and monitors and equipment checked Spinal Block Patient position: sitting Prep: site prepped and draped and DuraPrep Patient monitoring: heart rate, cardiac monitor, continuous pulse ox and blood pressure Approach: midline Location: L3-4 Injection technique: single-shot Needle Needle type: Pencan  Needle gauge: 24 G Needle length: 9 cm Needle insertion depth: 6 cm Assessment Sensory level: T4 Additional Notes Patient tolerated procedure well. Adequate sensory level.

## 2017-08-03 NOTE — Transfer of Care (Signed)
Immediate Anesthesia Transfer of Care Note  Patient: Joseph [name]  Procedure(s) Performed: LEFT TOTAL KNEE ARTHROPLASTY (Left )  Patient Location: PACU  Anesthesia Type:General  Level of Consciousness: awake, alert  and oriented  Airway & Oxygen Therapy: Patient Spontanous Breathing and Patient connected to face mask oxygen  Post-op Assessment: Report given to RN and Post -op Vital signs reviewed and stable  Post vital signs: Reviewed and stable  Last Vitals:  Vitals:   08/03/17 0558 08/03/17 0620  BP:  (!) 93/55  Pulse: 85   Resp: 18   Temp: 37.1 C   SpO2: 93%     Last Pain:  Vitals:   08/03/17 0613  TempSrc:   PainSc: 7       Patients Stated Pain Goal: 5 (29/51/88 4166)  Complications: No apparent anesthesia complications

## 2017-08-03 NOTE — Anesthesia Procedure Notes (Addendum)
Anesthesia Regional Block: Adductor canal block   Pre-Anesthetic Checklist: ,, timeout performed, Correct Patient, Correct Site, Correct Laterality, Correct Procedure, Correct Position, site marked, Risks and benefits discussed,  Surgical consent,  Pre-op evaluation,  At surgeon's request and post-op pain management  Laterality: Left  Prep: chloraprep       Needles:  Injection technique: Single-shot  Needle Type: Echogenic Stimulator Needle     Needle Length: 9cm    Needle insertion depth: 6 cm   Additional Needles:   Procedures:,,,, ultrasound used (permanent image in chart),,,,  Narrative:  Start time: 08/03/2017 7:02 AM End time: 08/03/2017 7:07 AM Injection made incrementally with aspirations every 5 mL.  Performed by: Personally  Anesthesiologist: Josephine Igo, MD  Additional Notes: Timeout performed. Patient sedated. Relevant anatomy ID'd using [place]. Incremental 2-44ml injection of LA with frequent aspiration. Patient tolerated procedure well.

## 2017-08-03 NOTE — Evaluation (Signed)
Physical Therapy Evaluation Patient Details Name: Joseph [name] MRN: [id number] DOB: 1941/07/14 Today's Date: 08/03/2017   History of Present Illness  Pt is 76 y/o male s/p elecive L TKA. PMH includes lung cancer, PVD, HTN, CAD, CHF, a fib, COPD, and renal disease.   Clinical Impression  Pt is s/p surgery above with deficits below. PTA, pt was independent with functional mobility. Upon eval, pt presenting with post op pain and weakness, and slightly decreased balance. Pt requiring min to min guard assist with mobility using RW. Reports wife will be able to assist at home as needed and will have all necessary DME. Follow up recommendations per MD arrangements. Will continue to follow acutely to maximize functional mobility independence and safety.     Follow Up Recommendations DC plan and follow up therapy as arranged by surgeon;Supervision for mobility/OOB    Equipment Recommendations  None recommended by PT    Recommendations for Other Services       Precautions / Restrictions Precautions Precautions: Knee Precaution Booklet Issued: Yes (comment) Precaution Comments: Reviewed supine knee ther ex with pt.  Restrictions Weight Bearing Restrictions: Yes LLE Weight Bearing: Partial weight bearing LLE Partial Weight Bearing Percentage or Pounds: 50      Mobility  Bed Mobility Overal bed mobility: Needs Assistance Bed Mobility: Supine to Sit     Supine to sit: Min assist     General bed mobility comments: Min A for trunk elevation. Use of elevated HOB.   Transfers Overall transfer level: Needs assistance Equipment used: Rolling walker (2 wheeled) Transfers: Sit to/from Stand Sit to Stand: Min assist         General transfer comment: Min A for lift assist and steadying assist. Verbal cues for safe hand placement.   Ambulation/Gait Ambulation/Gait assistance: Min guard Ambulation Distance (Feet): 25 Feet Assistive device: Rolling walker (2 wheeled) Gait  Pattern/deviations: Step-to pattern;Decreased step length - right;Decreased step length - left;Decreased weight shift to left;Antalgic Gait velocity: Decreased Gait velocity interpretation: Below normal speed for age/gender General Gait Details: Slow, antalgic gait. Verbal cues for using UEs to maintain 50% WB on LLE. Verbal cues for sequencing.   Stairs            Wheelchair Mobility    Modified Rankin (Stroke Patients Only)       Balance Overall balance assessment: Needs assistance Sitting-balance support: No upper extremity supported;Feet supported Sitting balance-Leahy Scale: Good     Standing balance support: Bilateral upper extremity supported;During functional activity Standing balance-Leahy Scale: Poor Standing balance comment: Reliant on BUE support.                              Pertinent Vitals/Pain Pain Assessment: 0-10 Pain Score: 5  Pain Location: L knee  Pain Descriptors / Indicators: Aching;Operative site guarding Pain Intervention(s): Limited activity within patient's tolerance;Monitored during session;Repositioned    Home Living Family/patient expects to be discharged to:: Private residence Living Arrangements: Spouse/significant other Available Help at Discharge: Family;Available 24 hours/day Type of Home: House Home Access: Stairs to enter Entrance Stairs-Rails: None Entrance Stairs-Number of Steps: 1(threshold step ) Home Layout: Two level;Able to live on main level with bedroom/bathroom Home Equipment: Bedside commode;Cane - single point;Shower seat;Walker - 2 wheels      Prior Function Level of Independence: Independent               Hand Dominance   Dominant Hand: Right    Extremity/Trunk Assessment  Upper Extremity Assessment Upper Extremity Assessment: Defer to OT evaluation    Lower Extremity Assessment Lower Extremity Assessment: LLE deficits/detail LLE Deficits / Details: Sensory in tact. Deficits  consistent with post op pain and weakness. Able to perform ther ex below.     Cervical / Trunk Assessment Cervical / Trunk Assessment: Normal  Communication   Communication: No difficulties  Cognition Arousal/Alertness: Awake/alert Behavior During Therapy: WFL for tasks assessed/performed Overall Cognitive Status: Within Functional Limits for tasks assessed                                        General Comments General comments (skin integrity, edema, etc.): Pt's wife present during session.     Exercises Total Joint Exercises Ankle Circles/Pumps: AROM;Both;20 reps Quad Sets: AROM;Left;10 reps Towel Squeeze: AROM;Both;10 reps Heel Slides: AROM;Left;10 reps Hip ABduction/ADduction: AROM;Left;10 reps   Assessment/Plan    PT Assessment Patient needs continued PT services  PT Problem List Decreased strength;Decreased range of motion;Decreased balance;Decreased mobility;Decreased knowledge of use of DME;Decreased knowledge of precautions;Pain       PT Treatment Interventions DME instruction;Gait training;Stair training;Functional mobility training;Therapeutic activities;Therapeutic exercise;Balance training;Neuromuscular re-education;Patient/family education    PT Goals (Current goals can be found in the Care Plan section)  Acute Rehab PT Goals Patient Stated Goal: to go home  PT Goal Formulation: With patient Time For Goal Achievement: 08/10/17 Potential to Achieve Goals: Good    Frequency 7X/week   Barriers to discharge        Co-evaluation               AM-PAC PT "6 Clicks" Daily Activity  Outcome Measure Difficulty turning over in bed (including adjusting bedclothes, sheets and blankets)?: A Little Difficulty moving from lying on back to sitting on the side of the bed? : Unable Difficulty sitting down on and standing up from a chair with arms (e.g., wheelchair, bedside commode, etc,.)?: Unable Help needed moving to and from a bed to chair  (including a wheelchair)?: A Little Help needed walking in hospital room?: A Little Help needed climbing 3-5 steps with a railing? : A Lot 6 Click Score: 13    End of Session Equipment Utilized During Treatment: Gait belt Activity Tolerance: Patient tolerated treatment well Patient left: in chair;with call bell/phone within reach;with family/visitor present Nurse Communication: Mobility status PT Visit Diagnosis: Other abnormalities of gait and mobility (R26.89);Pain Pain - Right/Left: Left Pain - part of body: Knee    Time: 3903-0092 PT Time Calculation (min) (ACUTE ONLY): 33 min   Charges:   PT Evaluation $PT Eval Low Complexity: 1 Low PT Treatments $Gait Training: 8-22 mins   PT G Codes:        [name], PT, DPT  Acute Rehabilitation Services  Pager: [phone]   Rudean Hitt 08/03/2017, 6:12 PM

## 2017-08-03 NOTE — Op Note (Signed)
PATIENT ID:      Joseph [name]  MRN:     [id number] DOB/AGE:    1941-05-14 / 76 y.o.       OPERATIVE REPORT    DATE OF PROCEDURE:  08/03/2017       PREOPERATIVE DIAGNOSIS: End Stage  Osteoarthritis Left Knee                                                       Estimated body mass index is 29.85 kg/m as calculated from the following:   Height as of this encounter: 5\' 11"  (1.803 m).   Weight as of this encounter: 214 lb (97.1 kg).     POSTOPERATIVE DIAGNOSIS:End Stage Osteoarthritis Left Knee                                                                     Estimated body mass index is 29.85 kg/m as calculated from the following:   Height as of this encounter: 5\' 11"  (1.803 m).   Weight as of this encounter: 214 lb (97.1 kg).     PROCEDURE:  Procedure(s): LEFT TOTAL KNEE ARTHROPLASTY     SURGEON:  [name], MD    ASSISTANT:   Biagio Borg, PA-C   (Present and scrubbed throughout the case, critical for assistance with exposure, retraction, instrumentation, and closure.)          ANESTHESIA: regional, spinal and IV sedation     DRAINS: none :      TOURNIQUET TIME:  Total Tourniquet Time Documented: Thigh (Left) - 80 minutes Total: Thigh (Left) - 80 minutes     COMPLICATIONS:  None   CONDITION:  stable  PROCEDURE IN DETAIL: [id number]   Joseph [name] 08/03/2017, 9:18 AM

## 2017-08-03 NOTE — Progress Notes (Signed)
Orthopedic Tech Progress Note Patient Details:  Joseph [name] [birth year] [id number]  CPM Left Knee CPM Left Knee: On Left Knee Flexion (Degrees): 90 Left Knee Extension (Degrees): 0 Additional Comments: Left knee  Post Interventions Patient Tolerated: Well Instructions Provided: Care of device  [name] 08/03/2017, 11:21 AM

## 2017-08-03 NOTE — Anesthesia Postprocedure Evaluation (Signed)
Anesthesia Post Note  Patient: Joseph [name]  Procedure(s) Performed: LEFT TOTAL KNEE ARTHROPLASTY (Left )     Patient location during evaluation: PACU Anesthesia Type: Spinal Level of consciousness: oriented and awake and alert Pain management: pain level controlled Vital Signs Assessment: post-procedure vital signs reviewed and stable Respiratory status: spontaneous breathing, respiratory function stable and nonlabored ventilation Cardiovascular status: blood pressure returned to baseline and stable Postop Assessment: no headache, no backache, no apparent nausea or vomiting, spinal receding and patient able to bend at knees Anesthetic complications: no    Last Vitals:  Vitals:   08/03/17 1045 08/03/17 1115  BP: 90/66 95/61  Pulse: (!) 55 (!) 58  Resp: 10 11  Temp:    SpO2: 100% 97%    Last Pain:  Vitals:   08/03/17 0945  TempSrc:   PainSc: 0-No pain                 [name],Sunil A.

## 2017-08-04 ENCOUNTER — Encounter (HOSPITAL_COMMUNITY): Payer: Self-pay | Admitting: Orthopaedic Surgery

## 2017-08-04 LAB — BASIC METABOLIC PANEL
ANION GAP: 8 (ref 5–15)
BUN: 22 mg/dL — ABNORMAL HIGH (ref 6–20)
CALCIUM: 7.8 mg/dL — AB (ref 8.9–10.3)
CO2: 21 mmol/L — ABNORMAL LOW (ref 22–32)
CREATININE: 1.04 mg/dL (ref 0.61–1.24)
Chloride: 105 mmol/L (ref 101–111)
GLUCOSE: 103 mg/dL — AB (ref 65–99)
Potassium: 4.3 mmol/L (ref 3.5–5.1)
Sodium: 134 mmol/L — ABNORMAL LOW (ref 135–145)

## 2017-08-04 LAB — CBC
HCT: 38 % — ABNORMAL LOW (ref 39.0–52.0)
Hemoglobin: 12.9 g/dL — ABNORMAL LOW (ref 13.0–17.0)
MCH: 32.8 pg (ref 26.0–34.0)
MCHC: 33.9 g/dL (ref 30.0–36.0)
MCV: 96.7 fL (ref 78.0–100.0)
PLATELETS: 93 10*3/uL — AB (ref 150–400)
RBC: 3.93 MIL/uL — ABNORMAL LOW (ref 4.22–5.81)
RDW: 14.4 % (ref 11.5–15.5)
WBC: 6 10*3/uL (ref 4.0–10.5)

## 2017-08-04 NOTE — Discharge Instructions (Signed)

## 2017-08-04 NOTE — Progress Notes (Signed)
PATIENT ID: Joseph [name]        MRN:  [id number]          DOB/AGE: 1941/02/28 / 76 y.o.    Joseph Fears, MD   Biagio Borg, PA-C [address] [place], [place]  [address]                             [phone]   PROGRESS NOTE  Subjective:  negative for Chest Pain  negative for Shortness of Breath  negative for Nausea/Vomiting   negative for Calf Pain    Tolerating Diet: yes         Patient reports pain as mild and moderate.     comfortable this am  Objective: Vital signs in last 24 hours:    Patient Vitals for the past 24 hrs:  BP Temp Temp src Pulse Resp SpO2  08/04/17 0630 (!) 100/58 (!) 100.6 F (38.1 C) Oral (!) 119 19 91 %  08/03/17 2230 116/80 98.2 F (36.8 C) Oral 95 16 94 %  08/03/17 1455 106/68 97.6 F (36.4 C) Oral 78 - 94 %  08/03/17 1443 108/64 - - 79 13 94 %  08/03/17 1415 101/63 - - - - -  08/03/17 1315 100/64 - - 67 15 96 %  08/03/17 1300 102/67 - - 69 16 94 %  08/03/17 1245 100/62 - - 67 16 96 %  08/03/17 1230 113/75 - - 79 13 94 %  08/03/17 1215 94/60 - - 65 13 95 %  08/03/17 1200 - - - - 16 -  08/03/17 1145 - - - - 16 -  08/03/17 1115 95/61 - - (!) 58 11 97 %  08/03/17 1045 90/66 - - (!) 55 10 100 %  08/03/17 1015 (!) 71/55 - - 66 (!) 8 99 %  08/03/17 0955 - - - - 16 -  08/03/17 0951 (!) 81/55 - - - - -  08/03/17 0946 (!) 73/52 - - - - -  08/03/17 0945 (!) 66/51 98.1 F (36.7 C) - 72 16 -  08/03/17 0940 (!) 131/108 - - - - -      Intake/Output from previous day:   12/04 0701 - 12/05 0700 In: 1940 [P.O.:240; I.V.:1600] Out: 600 [Urine:550]   Intake/Output this shift:   No intake/output data recorded.   Intake/Output      12/04 0701 - 12/05 0700 12/05 0701 - 12/06 0700   P.O. 240    I.V. (mL/kg) 1600 (16.5)    IV Piggyback 100    Total Intake(mL/kg) 1940 (20)    Urine (mL/kg/hr) 550 (0.2)    Blood 50    Total Output 600    Net +1340            LABORATORY DATA: Recent Labs    08/04/17 0540  WBC 6.0  HGB 12.9*   HCT 38.0*  PLT 93*   Recent Labs    08/04/17 0540  NA 134*  K 4.3  CL 105  CO2 21*  BUN 22*  CREATININE 1.04  GLUCOSE 103*  CALCIUM 7.8*   Lab Results  Component Value Date   INR 1.08 07/21/2017   INR 1.1 (H) 06/21/2017   INR 1.29 11/01/2013    Recent Radiographic Studies :  Dg Chest 2 View  Result Date: 07/22/2017 CLINICAL DATA:  76 year old male status post left total knee arthroplasty EXAM: CHEST  2 VIEW COMPARISON:  Prior chest x-ray 03/21/2017  FINDINGS: Left subclavian approach port catheter remains in unchanged position. The catheter tip overlies the azygos vein. Stable cardiac and mediastinal contours. Atherosclerotic calcifications remain present in the transverse aorta. Marked pulmonary emphysema with bullous changes in both apices and right apical pleuroparenchymal scarring. No new focal airspace consolidation or suspicious appearing pulmonary mass or nodule. No acute osseous abnormality. The lungs remain hyperinflated. IMPRESSION: 1. Stable chest x-ray without evidence of active cardiopulmonary disease. 2. Persistent hyperinflation an biapical bullous emphysematous changes. Emphysema (ICD10-J43.9). 3. Stable position of left subclavian approach port catheter. The catheter tip is likely within the azygos vein. 4.  Aortic Atherosclerosis (ICD10-170.0) Electronically Signed   By: Jacqulynn Cadet M.D.   On: 07/22/2017 10:56   Mr Lumbar Spine W/o Contrast  Result Date: 07/10/2017 CLINICAL DATA:  Low back pain with sciatica.  Right-sided pain EXAM: MRI LUMBAR SPINE WITHOUT CONTRAST TECHNIQUE: Multiplanar, multisequence MR imaging of the lumbar spine was performed. No intravenous contrast was administered. COMPARISON:  None. FINDINGS: Segmentation:  Normal Alignment: 3 mm anterolisthesis L5-S1 due to bilateral pars defects of L5. Vertebrae: Negative for fracture or mass. Extensive discogenic changes at L2-3 on the right. Conus medullaris: Extends to the L1-2 level and appears  normal. Paraspinal and other soft tissues: Negative Disc levels: L1-2:  Negative L2-3: Advanced disc degeneration on the right with disc space narrowing and extensive endplate edema due to Schmorl's node. Posterior endplate spurring is present causing mild spinal stenosis. Subarticular stenosis right greater than left. L3-4: Disc degeneration with diffuse disc bulging and spurring. No significant stenosis. Mild facet degeneration L4-5: Moderate disc degeneration with disc bulging and spurring. Advanced facet hypertrophy bilaterally with mild to moderate spinal stenosis. Subarticular stenosis on the left. L5-S1: Bilateral pars defects of L5. 3 mm anterolisthesis. Negative for stenosis IMPRESSION: Advanced disc degeneration and Schmorl's node with surrounding edema on the right at L2-3. Subarticular stenosis on the right due to spurring. 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 Electronically Signed   By: Franchot Gallo M.D.   On: 07/10/2017 13:05   Xr C-arm No Report  Result Date: 07/29/2017 Please see Notes or Procedures tab for imaging impression.    Examination:  General appearance: alert, cooperative and no distress  Wound Exam: clean, dry, intact   Drainage:  None: wound tissue dry  Motor Exam: EHL, FHL, Anterior Tibial and Posterior Tibial Intact  Sensory Exam: Superficial Peroneal, Deep Peroneal and Tibial normal  Vascular Exam: Normal  Assessment:    1 Day Post-Op  Procedure(s) (LRB): LEFT TOTAL KNEE ARTHROPLASTY (Left)  ADDITIONAL DIAGNOSIS:  Principal Problem:   Primary osteoarthritis of left knee Active Problems:   S/P total knee replacement using cement, left     Plan: Physical Therapy as ordered Partial Weight Bearing @ 50% (PWB)  DVT Prophylaxis:  Xarelto, Foot Pumps and TED hose  DISCHARGE PLAN: Home  DISCHARGE NEEDS: HHPT, CPM, Walker and 3-in-1  comode seat D/C foley, OOB with PT, saline lock IV      [name]  08/04/2017 8:12 AM  Patient ID: Joseph [name], male   DOB: 1941/02/05, 76 y.o.   MRN: [id number]

## 2017-08-04 NOTE — Op Note (Signed)
NAME:  Joseph [name], Joseph [name]               ACCOUNT NO.:  MEDICAL RECORD NO.:  [id number]  LOCATION:                                 FACILITY:  PHYSICIAN:  Vonna Kotyk. Durward Fortes, M.D.    DATE OF BIRTH:  DATE OF PROCEDURE:  08/03/2017 DATE OF DISCHARGE:                              OPERATIVE REPORT   PREOPERATIVE DIAGNOSIS:  End-stage osteoarthritis, left knee.  POSTOPERATIVE DIAGNOSIS:  End-stage osteoarthritis, left knee.  PROCEDURE:  Left total knee replacement.  SURGEON:  Vonna Kotyk. Durward Fortes, MD.  ASSISTANT:  Biagio Borg, PAC.  ANESTHESIA:  Spinal with adductor canal block and IV sedation.  COMPLICATIONS:  None.  COMPONENTS:  DePuy LCS standard plus femoral component, a #4 rotating keeled tibial tray with a 12.5-mm bridging bearing and metal-backed 3- peg rotating patella.  All components were secured with polymethyl methacrylate.  DESCRIPTION OF PROCEDURE:  Mr. Crisman was met with his wife in the holding area, identified the left knee as appropriate operative site and marked it accordingly.  Any questions were answered.  He did receive a preoperative adductor canal block.  The patient was then transferred to room #7.  He was placed under spinal anesthetic with IV sedation by Anesthesia.  Foley catheter was inserted. Urine was initially clear.  There was a little bit of blood tinge at the very end of the case.  Left lower extremity was then placed in a thigh tourniquet.  The leg was prepped with chlorhexidine scrub and DuraPrep x2 from the tourniquet to the tips of the toes.  Sterile draping was performed.  Time-out was called.  The left lower extremity was then elevated and Esmarch exsanguinated with a proximal tourniquet at 350 mmHg.  Time-out was called again.  A midline longitudinal incision was made centered about the patella extending from the superior pouch to tibial tubercle.  Via sharp dissection, incision was carried down to subcutaneous tissue.   First layer of capsule was incised in the midline.  A medial parapatellar incision was made with a Bovie.  The joint was entered.  There was a minimal clear yellow joint effusion.  Patella was everted 180 degrees laterally and the knee flexed to 90 degrees.  There was a moderate amount of beefy red synovitis.  Synovectomy was performed.  There were moderately large osteophytes along the medial and lateral femoral condyle and medial tibial plateau.  These were removed.  There was complete absence of articular cartilage along the medial compartment, both femur and tibia.  We measured a standard plus femoral component. First, bony cut was then made transversely in the proximal tibia with a 7-degree angle of declination.  On each bony cut on both femur and the tibia, I used the external guide to be sure we had appropriate alignment.  Subsequent cuts were then made on the femur using the standard plus tibial jig.  I used a 4-degree distal femoral valgus cut. Finishing guide was inserted.  Flexion and extension gaps appeared to be symmetrical at 10 mm.  Lamina spreader was inserted along the medial and lateral compartments. I removed medial and lateral menisci as well as ACL and PCL.  Large osteophytes were removed from the posterior  femoral condyle.  There were no loose bodies.  There was a popliteal cyst identified in the posteromedial compartment and visible synovitis was debrided.  Retractors were then placed about the tibia, was advanced anteriorly.  I measured a #4 tibial tray.  This was pinned in place.  A center hole was made followed by the keeled cut.  With the tibial jig in place, I inserted a 10-mm polyethylene bridging bearing followed by the standard plus femoral component.  I had slight hyperextension in just minimal opening with varus or valgus stress, so I trialed with 12.5-mm polyethylene bridging bearing and felt that I had full extension and had more stability.  Trial  components were removed.  The patella jig was then applied.  Bony cut was made.  I removed 10 mm of bone leaving approximately 15 mm of patella thickness.  The trial patella was inserted and then placed a full range of motion without instability prior to removing the trial components of both femur and tibia.  The joint was then copiously irrigated with saline solution.  The final components were impacted with polymethyl methacrylate.  We initially applied tibial component followed by the trial polyethylene 12.5-mm bridging bearing and then the femur.  Extraneous methacrylate was removed from the periphery of the components.  Patella was applied with a patellar clamp and methacrylate.  Methacrylate matured at approximately 16 minutes during which time we injected the deep capsule with 0.25% Marcaine with epinephrine.  We then removed the trial polyethylene component, removed any further extraneous methacrylate from around the tibia and the femur.  I irrigated the joint and reinserted the final 12.5-mm polyethylene bridging bearing.  This was reduced.  The joint was reduced and through a full range of motion it was perfectly stable.  Tourniquet was deflated at approximately 76 minutes.  Gross bleeders were Bovie coagulated.  We applied tranexamic acid topically and compression for over 5 minutes.  We had a nice dry field.  Drain was not inserted.  The deep capsule was then closed with a running #1 Ethibond, superficial capsule with 0 and 2-0 Vicryl, and subcu with 3-0 Monocryl.  Skin was closed with skin clips.  A sterile bulky dressing was applied followed by the patient's support stocking.  The patient tolerated the procedure without complications.     Vonna Kotyk. Durward Fortes, M.D.     PWW/MEDQ  D:  08/03/2017  T:  08/03/2017  Job:  [id number]

## 2017-08-04 NOTE — Progress Notes (Signed)
Physical Therapy Treatment Patient Details Name: Joseph [name] MRN: [id number] DOB: Feb 22, 1941 Today's Date: 08/04/2017    History of Present Illness Pt is 76 y/o male s/p elecive L TKA. PMH includes lung cancer, PVD, HTN, CAD, CHF, a fib, COPD, and renal disease.     PT Comments    Patient is making progress toward mobility goals. Pt tolerated L LE therex and increased gait distance well. Pt experienced 2/4 DOE and SpO2 desat to 85% while ambulating. Pt demonstrated good breathing technique during seated rest break. Continue to progress as tolerated.    Follow Up Recommendations  DC plan and follow up therapy as arranged by surgeon;Supervision for mobility/OOB     Equipment Recommendations  None recommended by PT    Recommendations for Other Services       Precautions / Restrictions Precautions Precautions: Knee Precaution Booklet Issued: Yes (comment) Precaution Comments: positioning/precautions reviewed with pt and pt's wife Restrictions Weight Bearing Restrictions: Yes LLE Weight Bearing: Partial weight bearing LLE Partial Weight Bearing Percentage or Pounds: 50    Mobility  Bed Mobility Overal bed mobility: Needs Assistance Bed Mobility: Supine to Sit     Supine to sit: Supervision     General bed mobility comments: cues for technique; supervision for safety  Transfers Overall transfer level: Needs assistance Equipment used: Rolling walker (2 wheeled) Transfers: Sit to/from Stand Sit to Stand: Min guard         General transfer comment: cues for safe hand placement; min guard for safety  Ambulation/Gait Ambulation/Gait assistance: Min guard Ambulation Distance (Feet): 95 Feet Assistive device: Rolling walker (2 wheeled) Gait Pattern/deviations: Step-to pattern;Decreased step length - right;Decreased weight shift to left;Antalgic;Decreased stance time - left Gait velocity: Decreased   General Gait Details: cues for sequencing, posture, and safe  use of AD   Stairs            Wheelchair Mobility    Modified Rankin (Stroke Patients Only)       Balance Overall balance assessment: Needs assistance Sitting-balance support: No upper extremity supported;Feet supported Sitting balance-Leahy Scale: Good     Standing balance support: Bilateral upper extremity supported;During functional activity Standing balance-Leahy Scale: Poor Standing balance comment: Reliant on BUE support.                             Cognition Arousal/Alertness: Awake/alert Behavior During Therapy: WFL for tasks assessed/performed Overall Cognitive Status: Within Functional Limits for tasks assessed                                        Exercises Total Joint Exercises Ankle Circles/Pumps: AROM;Both;10 reps Quad Sets: AROM;Left;10 reps Short Arc Quad: AROM;Left;10 reps Heel Slides: AROM;Left;10 reps Hip ABduction/ADduction: AROM;Left;10 reps Straight Leg Raises: AROM;Left;10 reps Goniometric ROM: approx 70 degrees    General Comments        Pertinent Vitals/Pain Pain Assessment: Faces Faces Pain Scale: Hurts little more Pain Location: L knee and L thigh Pain Descriptors / Indicators: Aching;Grimacing;Guarding Pain Intervention(s): Limited activity within patient's tolerance;Monitored during session;Premedicated before session;Repositioned;Ice applied    Home Living                      Prior Function            PT Goals (current goals can now be found in the care  plan section) Acute Rehab PT Goals Patient Stated Goal: to go home  PT Goal Formulation: With patient Time For Goal Achievement: 08/10/17 Potential to Achieve Goals: Good Progress towards PT goals: Progressing toward goals    Frequency    7X/week      PT Plan Current plan remains appropriate    Co-evaluation              AM-PAC PT "6 Clicks" Daily Activity  Outcome Measure  Difficulty turning over in bed  (including adjusting bedclothes, sheets and blankets)?: A Little Difficulty moving from lying on back to sitting on the side of the bed? : A Lot Difficulty sitting down on and standing up from a chair with arms (e.g., wheelchair, bedside commode, etc,.)?: Unable Help needed moving to and from a bed to chair (including a wheelchair)?: A Little Help needed walking in hospital room?: A Little Help needed climbing 3-5 steps with a railing? : A Little 6 Click Score: 15    End of Session Equipment Utilized During Treatment: Gait belt Activity Tolerance: Patient tolerated treatment well Patient left: in chair;with call bell/phone within reach;with family/visitor present Nurse Communication: Mobility status PT Visit Diagnosis: Other abnormalities of gait and mobility (R26.89);Pain Pain - Right/Left: Left Pain - part of body: Knee     Time: 5638-7564 PT Time Calculation (min) (ACUTE ONLY): 42 min  Charges:  $Gait Training: 8-22 mins $Therapeutic Exercise: 23-37 mins                    G Codes:       Earney Navy, PTA Pager: [phone]     Darliss Cheney 08/04/2017, 12:19 PM

## 2017-08-04 NOTE — Evaluation (Signed)
Occupational Therapy Evaluation Patient Details Name: Joseph [name] MRN: [id number] DOB: [birth year] Today's Date: 08/04/2017    History of Present Illness Pt is 76 y/o male s/p elecive L TKA. PMH includes lung cancer, PVD, HTN, CAD, CHF, a fib, COPD, and renal disease.    Clinical Impression   Eval limited due to pt fatigue, however pt and wife very inquisitive about LB ADLs and shower transfers. Pt declined transfer due to fatigue after PT session, per PT notes pt is min guard A with transfers. Pt and wife educated on LB ADL techniques and shower transfers with handouts provided. Pt would benefit from acute OT services to address impairments to maximize level of function and safety    Follow Up Recommendations  DC plan and follow up therapy as arranged by surgeon;Supervision - Intermittent    Equipment Recommendations  Tub/shower seat;Other (comment)(reacher, LH bath sponge)    Recommendations for Other Services       Precautions / Restrictions Precautions Precautions: Knee Precaution Booklet Issued: Yes (comment) Precaution Comments:  reviewed no pillow under knee with pt and pt's wife Restrictions Weight Bearing Restrictions: Yes LLE Weight Bearing: Partial weight bearing LLE Partial Weight Bearing Percentage or Pounds: 50%      Mobility Bed Mobility Overal bed mobility: Needs Assistance Bed Mobility: Supine to Sit     Supine to sit: Supervision     General bed mobility comments: pt up in recliner upon OT arrival  Transfers Overall transfer level: Needs assistance Equipment used: Rolling walker (2 wheeled) Transfers: Sit to/from Stand Sit to Stand: Min guard         General transfer comment: pt declined transfer due to fatigue after PT session, per PT notes pt is min guard A with transfers    Balance Overall balance assessment: Needs assistance Sitting-balance support: No upper extremity supported;Feet supported Sitting balance-Leahy Scale: Good     Standing balance support: Bilateral upper extremity supported;During functional activity Standing balance-Leahy Scale: Poor Standing balance comment: NT                           ADL either performed or assessed with clinical judgement   ADL Overall ADL's : Needs assistance/impaired Eating/Feeding: Independent;Sitting   Grooming: Wash/dry hands;Wash/dry face;Sitting;Set up   Upper Body Bathing: Set up;Sitting   Lower Body Bathing: Moderate assistance;With caregiver independent assisting   Upper Body Dressing : Set up;Sitting   Lower Body Dressing: Moderate assistance;With caregiver independent assisting     Toilet Transfer Details (indicate cue type and reason): pt declined transfer due to fatigue after PT session, per PT notes pt is min guard A with transfers           General ADL Comments: pt and wife and many questions about shower transfers, OT educated them on proper/safetechnique with handout provided. Education for LB ADL techniques and A/E use with handout provided     Vision Baseline Vision/History: Wears glasses Wears Glasses: Reading only Patient Visual Report: No change from baseline       Perception     Praxis      Pertinent Vitals/Pain Pain Assessment: 0-10 Pain Score: 6  Faces Pain Scale: Hurts little more Pain Location: L knee and L thigh Pain Descriptors / Indicators: Aching;Sore Pain Intervention(s): Limited activity within patient's tolerance;Monitored during session;Premedicated before session     Hand Dominance Right   Extremity/Trunk Assessment Upper Extremity Assessment Upper Extremity Assessment: Overall WFL for tasks assessed   Lower  Extremity Assessment Lower Extremity Assessment: Defer to PT evaluation   Cervical / Trunk Assessment Cervical / Trunk Assessment: Normal   Communication Communication Communication: No difficulties   Cognition Arousal/Alertness: Awake/alert Behavior During Therapy: WFL for tasks  assessed/performed Overall Cognitive Status: Within Functional Limits for tasks assessed                                     General Comments   pt pleasant    Exercises    Shoulder Instructions      Home Living Family/patient expects to be discharged to:: Private residence Living Arrangements: Spouse/significant other Available Help at Discharge: Family;Available 24 hours/day Type of Home: House Home Access: Stairs to enter [employer] of Steps: 1 Entrance Stairs-Rails: None Home Layout: Two level;Able to live on main level with bedroom/bathroom     Bathroom Shower/Tub: Occupational psychologist: Standard     Home Equipment: Bedside commode;Cane - single point;Shower seat;Walker - 2 wheels          Prior Functioning/Environment Level of Independence: Independent        Comments: occasionally amb with cane d/t knee pain        OT Problem List: Decreased activity tolerance;Decreased knowledge of use of DME or AE;Pain;Impaired balance (sitting and/or standing)      OT Treatment/Interventions: Self-care/ADL training;DME and/or AE instruction;Therapeutic activities;Patient/family education    OT Goals(Current goals can be found in the care plan section) Acute Rehab OT Goals Patient Stated Goal: to go home  OT Goal Formulation: With patient/family Time For Goal Achievement: 08/11/17 Potential to Achieve Goals: Good ADL Goals Pt Will Perform Grooming: with min guard assist;with caregiver independent in assisting;standing Pt Will Perform Lower Body Bathing: with min assist;with min guard assist;with caregiver independent in assisting Pt Will Perform Lower Body Dressing: with min assist;with min guard assist;with caregiver independent in assisting Pt Will Transfer to Toilet: with supervision;ambulating Pt Will Perform Toileting - Clothing Manipulation and hygiene: with min assist;with min guard assist;sit to/from stand;with caregiver  independent in assisting Pt Will Perform Tub/Shower Transfer: with supervision;with min guard assist;shower seat;3 in 1;rolling walker;with caregiver independent in assisting  OT Frequency: Min 2X/week   Barriers to D/C:    no barriers       Co-evaluation              AM-PAC PT "6 Clicks" Daily Activity     Outcome Measure Help from another person eating meals?: None Help from another person taking care of personal grooming?: None Help from another person toileting, which includes using toliet, bedpan, or urinal?: A Lot Help from another person bathing (including washing, rinsing, drying)?: A Lot Help from another person to put on and taking off regular upper body clothing?: A Little Help from another person to put on and taking off regular lower body clothing?: A Lot 6 Click Score: 17   End of Session CPM Left Knee CPM Left Knee: Off  Activity Tolerance: Patient limited by fatigue Patient left: in chair;with family/visitor present;with call bell/phone within reach  OT Visit Diagnosis: Pain;Other abnormalities of gait and mobility (R26.89) Pain - Right/Left: Left Pain - part of body: Knee                Time: 5053-9767 OT Time Calculation (min): 24 min Charges:  OT General Charges $OT Visit: 1 Visit OT Evaluation $OT Eval Low Complexity: 1 Low OT Treatments $  Therapeutic Activity: 8-22 mins G-Codes: OT G-codes **NOT FOR INPATIENT CLASS** Functional Assessment Tool Used: AM-PAC 6 Clicks Daily Activity     Britt Bottom 08/04/2017, 2:20 PM

## 2017-08-04 NOTE — Progress Notes (Signed)
Physical Therapy Treatment Patient Details Name: Joseph [name] MRN: [id number] DOB: [birth year] Today's Date: 08/04/2017    History of Present Illness Pt is 76 y/o male s/p elecive L TKA. PMH includes lung cancer, PVD, HTN, CAD, CHF, a fib, COPD, and renal disease.     PT Comments    Patient continues to progress toward mobility goals and tolerated stair training well. Pt in CPM end of session. Current plan remains appropriate.    Follow Up Recommendations  DC plan and follow up therapy as arranged by surgeon;Supervision for mobility/OOB     Equipment Recommendations  None recommended by PT    Recommendations for Other Services       Precautions / Restrictions Precautions Precautions: Knee Precaution Booklet Issued: Yes (comment) Precaution Comments:  reviewed no pillow under knee with pt and pt's wife Restrictions Weight Bearing Restrictions: Yes LLE Weight Bearing: Partial weight bearing LLE Partial Weight Bearing Percentage or Pounds: 50%    Mobility  Bed Mobility Overal bed mobility: Needs Assistance Bed Mobility: Sit to Supine       Sit to supine: Supervision   General bed mobility comments: supervision for safety  Transfers Overall transfer level: Needs assistance Equipment used: Rolling walker (2 wheeled) Transfers: Sit to/from Stand Sit to Stand: Min guard         General transfer comment: cues for safe hand placement  Ambulation/Gait Ambulation/Gait assistance: Supervision;Min guard Ambulation Distance (Feet): 100 Feet Assistive device: Rolling walker (2 wheeled) Gait Pattern/deviations: Decreased weight shift to left;Decreased stance time - left;Step-through pattern;Decreased step length - right Gait velocity: Decreased   General Gait Details: cues for posture and breathing technique   Stairs Stairs: Yes   Stair Management: No rails;Step to pattern;Backwards;With walker Number of Stairs: 1 General stair comments: cues for sequencing  and technique; assist to stabilize RW  Wheelchair Mobility    Modified Rankin (Stroke Patients Only)       Balance Overall balance assessment: Needs assistance Sitting-balance support: No upper extremity supported;Feet supported Sitting balance-Leahy Scale: Good     Standing balance support: Bilateral upper extremity supported;During functional activity Standing balance-Leahy Scale: Poor Standing balance comment: NT                            Cognition Arousal/Alertness: Awake/alert Behavior During Therapy: WFL for tasks assessed/performed Overall Cognitive Status: Within Functional Limits for tasks assessed                                        Exercises      General Comments        Pertinent Vitals/Pain Pain Assessment: Faces Pain Score: 6  Faces Pain Scale: Hurts a little bit Pain Location: L knee  Pain Descriptors / Indicators: Sore;Discomfort Pain Intervention(s): Monitored during session;Limited activity within patient's tolerance;Repositioned    Home Living Family/patient expects to be discharged to:: Private residence Living Arrangements: Spouse/significant other Available Help at Discharge: Family;Available 24 hours/day Type of Home: House Home Access: Stairs to enter Entrance Stairs-Rails: None Home Layout: Two level;Able to live on main level with bedroom/bathroom Home Equipment: Bedside commode;Cane - single point;Shower seat;Walker - 2 wheels      Prior Function Level of Independence: Independent      Comments: occasionally amb with cane d/t knee pain   PT Goals (current goals can now be found in the care  plan section) Acute Rehab PT Goals Patient Stated Goal: to go home  PT Goal Formulation: With patient Time For Goal Achievement: 08/10/17 Potential to Achieve Goals: Good Progress towards PT goals: Progressing toward goals    Frequency    7X/week      PT Plan Current plan remains appropriate     Co-evaluation              AM-PAC PT "6 Clicks" Daily Activity  Outcome Measure  Difficulty turning over in bed (including adjusting bedclothes, sheets and blankets)?: A Little Difficulty moving from lying on back to sitting on the side of the bed? : A Little Difficulty sitting down on and standing up from a chair with arms (e.g., wheelchair, bedside commode, etc,.)?: Unable Help needed moving to and from a bed to chair (including a wheelchair)?: A Little Help needed walking in hospital room?: A Little Help needed climbing 3-5 steps with a railing? : A Little 6 Click Score: 16    End of Session Equipment Utilized During Treatment: Gait belt Activity Tolerance: Patient tolerated treatment well Patient left: with call bell/phone within reach;in bed;in CPM Nurse Communication: Mobility status PT Visit Diagnosis: Other abnormalities of gait and mobility (R26.89);Pain Pain - Right/Left: Left Pain - part of body: Knee     Time: 6720-9470 PT Time Calculation (min) (ACUTE ONLY): 24 min  Charges:  $Gait Training: 23-37 mins                    G Codes:       Earney Navy, PTA Pager: [phone]     Darliss Cheney 08/04/2017, 3:08 PM

## 2017-08-05 LAB — TYPE AND SCREEN
ABO/RH(D): O POS
Antibody Screen: POSITIVE
DAT, IgG: POSITIVE
UNIT DIVISION: 0
Unit division: 0

## 2017-08-05 LAB — BPAM RBC
Blood Product Expiration Date: 201812252359
Blood Product Expiration Date: 201812252359
Unit Type and Rh: 5100
Unit Type and Rh: 5100

## 2017-08-05 LAB — BASIC METABOLIC PANEL
Anion gap: 9 (ref 5–15)
BUN: 17 mg/dL (ref 6–20)
CHLORIDE: 103 mmol/L (ref 101–111)
CO2: 21 mmol/L — AB (ref 22–32)
Calcium: 8.3 mg/dL — ABNORMAL LOW (ref 8.9–10.3)
Creatinine, Ser: 0.99 mg/dL (ref 0.61–1.24)
GFR calc non Af Amer: >60 mL/min (ref >60)
Glucose, Bld: 111 mg/dL — ABNORMAL HIGH (ref 65–99)
POTASSIUM: 4 mmol/L (ref 3.5–5.1)
SODIUM: 133 mmol/L — AB (ref 135–145)

## 2017-08-05 LAB — CBC
HEMATOCRIT: 38.7 % — AB (ref 39.0–52.0)
HEMOGLOBIN: 13.3 g/dL (ref 13.0–17.0)
MCH: 33.3 pg (ref 26.0–34.0)
MCHC: 34.4 g/dL (ref 30.0–36.0)
MCV: 96.8 fL (ref 78.0–100.0)
Platelets: 114 10*3/uL — ABNORMAL LOW (ref 150–400)
RBC: 4 MIL/uL — AB (ref 4.22–5.81)
RDW: 14.2 % (ref 11.5–15.5)
WBC: 8.7 10*3/uL (ref 4.0–10.5)

## 2017-08-05 MED ORDER — METHOCARBAMOL 500 MG PO TABS: 500.0000 mg | ORAL_TABLET | Freq: Four times a day (QID) | ORAL | 0 refills | Status: DC | PRN

## 2017-08-05 MED ORDER — RIVAROXABAN 10 MG PO TABS: 10.0000 mg | ORAL_TABLET | Freq: Every day | ORAL | 0 refills | Status: DC

## 2017-08-05 MED ORDER — OXYCODONE HCL 5 MG PO TABS: 5.0000 mg | ORAL_TABLET | ORAL | 0 refills | Status: DC | PRN

## 2017-08-05 NOTE — Progress Notes (Signed)
Patient has met discharge criteria at this time. Discharge instructions discussed with patient and family.  Patient and family verbalized understanding of instructions.  Paper prescriptions given to patient for following medications: oxycodone, xarelto, robaxin.  Patient instructed to call office to schedule follow up appointment with surgeon. Denies any further questions or concerns.  Patient discharged home with family via wheelchair.  Instructed to call office with any further questions or concerns.

## 2017-08-05 NOTE — Progress Notes (Signed)
Physical Therapy Treatment Patient Details Name: Joseph [name] MRN: [id number] DOB: 12-25-1940 Today's Date: 08/05/2017    History of Present Illness Pt is 76 y/o male s/p elecive L TKA. PMH includes lung cancer, PVD, HTN, CAD, CHF, a fib, COPD, and renal disease.     PT Comments    Patient tolerated gait and stair training well but c/o more pain today. Wife assisted with ascending/descending steps and stair handout given. Current plan remains appropriate.   Follow Up Recommendations  DC plan and follow up therapy as arranged by surgeon;Supervision for mobility/OOB     Equipment Recommendations  None recommended by PT    Recommendations for Other Services       Precautions / Restrictions Precautions Precautions: Knee Precaution Booklet Issued: Yes (comment) Precaution Comments: positioning reviewed with pt Restrictions Weight Bearing Restrictions: Yes LLE Weight Bearing: Partial weight bearing LLE Partial Weight Bearing Percentage or Pounds: 50    Mobility  Bed Mobility Overal bed mobility: Modified Independent             General bed mobility comments: increased time and effort  Transfers Overall transfer level: Needs assistance Equipment used: Rolling walker (2 wheeled) Transfers: Sit to/from Stand Sit to Stand: Min guard         General transfer comment: cues for safe hand placement with some carry over demonstrated  Ambulation/Gait Ambulation/Gait assistance: Min guard;Supervision Ambulation Distance (Feet): 120 Feet Assistive device: Rolling walker (2 wheeled) Gait Pattern/deviations: Decreased weight shift to left;Decreased stance time - left;Step-through pattern;Decreased step length - right Gait velocity: Decreased   General Gait Details: cues for posture, L heel strike, and breathing technique   Stairs     Stair Management: No rails;Step to pattern;Backwards;With walker Number of Stairs: (2 steps X2) General stair comments: cues for  sequencing and technique; wife stabilized RW  Wheelchair Mobility    Modified Rankin (Stroke Patients Only)       Balance Overall balance assessment: Needs assistance Sitting-balance support: No upper extremity supported;Feet supported Sitting balance-Leahy Scale: Good     Standing balance support: Bilateral upper extremity supported;During functional activity Standing balance-Leahy Scale: Poor                              Cognition Arousal/Alertness: Awake/alert Behavior During Therapy: WFL for tasks assessed/performed Overall Cognitive Status: Within Functional Limits for tasks assessed                                        Exercises      General Comments General comments (skin integrity, edema, etc.): SpO2 87% on RA while ambulating and up to 95% at rest      Pertinent Vitals/Pain Pain Assessment: Faces Faces Pain Scale: Hurts even more Pain Location: L knee  Pain Descriptors / Indicators: Sore;Aching Pain Intervention(s): Limited activity within patient's tolerance;Monitored during session;Repositioned;RN gave pain meds during session    Home Living                      Prior Function            PT Goals (current goals can now be found in the care plan section) Acute Rehab PT Goals Patient Stated Goal: to go home  PT Goal Formulation: With patient Time For Goal Achievement: 08/10/17 Potential to Achieve Goals: Good Progress towards PT  goals: Progressing toward goals    Frequency    7X/week      PT Plan Current plan remains appropriate    Co-evaluation              AM-PAC PT "6 Clicks" Daily Activity  Outcome Measure  Difficulty turning over in bed (including adjusting bedclothes, sheets and blankets)?: A Little Difficulty moving from lying on back to sitting on the side of the bed? : A Little Difficulty sitting down on and standing up from a chair with arms (e.g., wheelchair, bedside commode,  etc,.)?: Unable Help needed moving to and from a bed to chair (including a wheelchair)?: A Little Help needed walking in hospital room?: A Little Help needed climbing 3-5 steps with a railing? : A Little 6 Click Score: 16    End of Session Equipment Utilized During Treatment: Gait belt Activity Tolerance: Patient tolerated treatment well Patient left: with call bell/phone within reach;in chair;with family/visitor present Nurse Communication: Mobility status PT Visit Diagnosis: Other abnormalities of gait and mobility (R26.89);Pain Pain - Right/Left: Left Pain - part of body: Knee     Time: 0842-0920 PT Time Calculation (min) (ACUTE ONLY): 38 min  Charges:  $Gait Training: 23-37 mins $Therapeutic Activity: 8-22 mins                    G Codes:       Earney Navy, PTA Pager: [phone]     Darliss Cheney 08/05/2017, 11:03 AM

## 2017-08-05 NOTE — Procedures (Signed)
Joseph [name] is a medically complicated 76 year old gentleman followed by Dr. Durward Fortes.  He has a history of congestive heart failure as well as lung cancer and COPD.  He has had chronic low back pain with right hip and thigh pain radiating to the groin.  MRI evidence which is reviewed below shows acute Schmorl's node at L2-3 with edema level.  He also is undergoing treatment for left knee osteoarthritis.  He is scheduled for knee replacement surgery early part of December.  Dr. Durward Fortes had wanted him to have a right L2 and L3 transforaminal epidural steroid injection performed due to the patient's having severe symptoms and this would help probably get through his knee surgery.  We did ask for clearance by Dr. Durward Fortes for doing the injection and this was cleared on 07/19/2017.  Lumbosacral Transforaminal Epidural Steroid Injection - Sub-Pedicular Approach with Fluoroscopic Guidance  Patient: Joseph [name]      Date of Birth: [birth year] MRN: [id number] PCP: [name], MD      Visit Date: 07/29/2017   Universal Protocol:    Date/Time: 07/29/2017  Consent Given By: the patient  Position: PRONE  Additional Comments: Vital signs were monitored before and after the procedure. Patient was prepped and draped in the usual sterile fashion. The correct patient, procedure, and site was verified.   Injection Procedure Details:  Procedure Site One Meds Administered:  Meds ordered this encounter  Medications  . lidocaine (PF) (XYLOCAINE) 1 % injection 2 mL  . dexamethasone (DECADRON) injection 15 mg    Laterality: Right  Location/Site:  L2-L3 L3-L4  Needle size: 25 G  Needle type: Spinal  Needle Placement: Transforaminal  Findings:    -Comments: Excellent flow of contrast along the nerve and into the epidural space.  Procedure Details: After squaring off the end-plates to get a true AP view, the C-arm was positioned so that an oblique view of the foramen as noted  above was visualized. The target area is just inferior to the "nose of the scotty dog" or sub pedicular. The soft tissues overlying this structure were infiltrated with 2-3 ml. of 1% Lidocaine without Epinephrine.  The spinal needle was inserted toward the target using a "trajectory" view along the fluoroscope beam.  Under AP and lateral visualization, the needle was advanced so it did not puncture dura and was located close the 6 O'Clock position of the pedical in AP tracterory. Biplanar projections were used to confirm position. Aspiration was confirmed to be negative for CSF and/or blood. A 1-2 ml. volume of Isovue-250 was injected and flow of contrast was noted at each level. Radiographs were obtained for documentation purposes.   After attaining the desired flow of contrast documented above, a 0.5 to 1.0 ml test dose of 0.25% Marcaine was injected into each respective transforaminal space.  The patient was observed for 90 seconds post injection.  After no sensory deficits were reported, and normal lower extremity motor function was noted,   the above injectate was administered so that equal amounts of the injectate were placed at each foramen (level) into the transforaminal epidural space.   Additional Comments:  The patient tolerated the procedure well Dressing: Band-Aid    Post-procedure details: Patient was observed during the procedure. Post-procedure instructions were reviewed.  Patient left the clinic in stable condition.   Pertinent Imaging: MRI LUMBAR SPINE WITHOUT CONTRAST  TECHNIQUE: Multiplanar, multisequence MR imaging of the lumbar spine was performed. No intravenous contrast was administered.  COMPARISON:  None.  FINDINGS: Segmentation:  Normal  Alignment: 3 mm anterolisthesis L5-S1 due to bilateral pars defects of L5.  Vertebrae: Negative for fracture or mass. Extensive discogenic changes at L2-3 on the right.  Conus medullaris: Extends to the L1-2 level  and appears normal.  Paraspinal and other soft tissues: Negative  Disc levels:  L1-2:  Negative  L2-3: Advanced disc degeneration on the right with disc space narrowing and extensive endplate edema due to Schmorl's node. Posterior endplate spurring is present causing mild spinal stenosis. Subarticular stenosis right greater than left.  L3-4: Disc degeneration with diffuse disc bulging and spurring. No significant stenosis. Mild facet degeneration  L4-5: Moderate disc degeneration with disc bulging and spurring. Advanced facet hypertrophy bilaterally with mild to moderate spinal stenosis. Subarticular stenosis on the left.  L5-S1: Bilateral pars defects of L5. 3 mm anterolisthesis. Negative for stenosis  IMPRESSION: Advanced disc degeneration and Schmorl's node with surrounding edema on the right at L2-3. Subarticular stenosis on the right due to spurring. 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   Electronically Signed   By: Franchot Gallo M.D.   On: 07/10/2017 13:05

## 2017-08-05 NOTE — Progress Notes (Signed)
OT Cancellation Note  Patient Details Name: Joseph [name] MRN: [id number] DOB: 24-Apr-1941   Cancelled Treatment:    Reason Eval/Treat Not Completed: Patient declined, no reason specified. Pt politely declined while on CPM awaiting second PT session before d/c this afternoon. Pt/wife did not have any further questions or concerns as as far OT. Pt has all necessary DME, A/E and assist for safe return home today  Britt Bottom 08/05/2017, 1:57 PM

## 2017-08-05 NOTE — Progress Notes (Signed)
PATIENT ID: Joseph [name]        MRN:  [id number]          DOB/AGE: 1940-11-08 / 76 y.o.    Joseph Fears, MD   Joseph Borg, PA-C [address] [place], [place]  [address]                             [phone]   PROGRESS NOTE  Subjective:  negative for Chest Pain  mild Shortness of Breath-no different from pre op  negative for Nausea/Vomiting   negative for Calf Pain    Tolerating Diet: yes         Patient reports pain as mild and moderate.     Comfortable this am  Objective: Vital signs in last 24 hours:    Patient Vitals for the past 24 hrs:  BP Temp Temp src Pulse Resp SpO2  08/05/17 0757 - - - - - 98 %  08/05/17 0625 138/62 98.3 F (36.8 C) Oral 88 20 99 %  08/04/17 2209 116/73 - - 90 - -  08/04/17 2117 (!) 90/56 98.1 F (36.7 C) Oral 90 20 98 %  08/04/17 1300 (!) 98/58 98 F (36.7 C) Oral 92 19 97 %  08/04/17 0956 (!) 95/53 98.5 F (36.9 C) Oral (!) 102 19 93 %      Intake/Output from previous day:   12/05 0701 - 12/06 0700 In: 1368.8 [P.O.:240; I.V.:1028.8] Out: 800 [Urine:800]   Intake/Output this shift:   12/06 0701 - 12/06 1900 In: -  Out: 125 [Urine:125]   Intake/Output      12/05 0701 - 12/06 0700 12/06 0701 - 12/07 0700   P.O. 240    I.V. (mL/kg) 1028.8 (10.6)    IV Piggyback 100    Total Intake(mL/kg) 1368.8 (14.1)    Urine (mL/kg/hr) 800 (0.3) 125 (0.5)   Blood     Total Output 800 125   Net +568.8 -125           LABORATORY DATA: Recent Labs    08/04/17 0540 08/05/17 0554  WBC 6.0 8.7  HGB 12.9* 13.3  HCT 38.0* 38.7*  PLT 93* 114*   Recent Labs    08/04/17 0540 08/05/17 0554  NA 134* 133*  K 4.3 4.0  CL 105 103  CO2 21* 21*  BUN 22* 17  CREATININE 1.04 0.99  GLUCOSE 103* 111*  CALCIUM 7.8* 8.3*   Lab Results  Component Value Date   INR 1.08 07/21/2017   INR 1.1 (H) 06/21/2017   INR 1.29 11/01/2013    Recent Radiographic Studies :  Dg Chest 2 View  Result Date: 07/22/2017 CLINICAL DATA:   76 year old male status post left total knee arthroplasty EXAM: CHEST  2 VIEW COMPARISON:  Prior chest x-ray 03/21/2017 FINDINGS: Left subclavian approach port catheter remains in unchanged position. The catheter tip overlies the azygos vein. Stable cardiac and mediastinal contours. Atherosclerotic calcifications remain present in the transverse aorta. Marked pulmonary emphysema with bullous changes in both apices and right apical pleuroparenchymal scarring. No new focal airspace consolidation or suspicious appearing pulmonary mass or nodule. No acute osseous abnormality. The lungs remain hyperinflated. IMPRESSION: 1. Stable chest x-ray without evidence of active cardiopulmonary disease. 2. Persistent hyperinflation an biapical bullous emphysematous changes. Emphysema (ICD10-J43.9). 3. Stable position of left subclavian approach port catheter. The catheter tip is likely within the azygos vein. 4.  Aortic Atherosclerosis (ICD10-170.0) Electronically Signed   By:  Joseph [name] M.D.   On: 07/22/2017 10:56   Joseph [name] W/o Contrast  Result Date: 07/10/2017 CLINICAL DATA:  Low back pain with sciatica.  Right-sided pain EXAM: MRI LUMBAR [name] WITHOUT CONTRAST TECHNIQUE: Multiplanar, multisequence Joseph imaging of the lumbar [name] was performed. No intravenous contrast was administered. COMPARISON:  None. FINDINGS: Segmentation:  Normal Alignment: 3 mm anterolisthesis L5-S1 due to bilateral pars defects of L5. Vertebrae: Negative for fracture or mass. Extensive discogenic changes at L2-3 on the right. Conus medullaris: Extends to the L1-2 level and appears normal. Paraspinal and other soft tissues: Negative Disc levels: L1-2:  Negative L2-3: Advanced disc degeneration on the right with disc space narrowing and extensive endplate edema due to Schmorl's node. Posterior endplate spurring is present causing mild spinal stenosis. Subarticular stenosis right greater than left. L3-4: Disc degeneration with diffuse disc  bulging and spurring. No significant stenosis. Mild facet degeneration L4-5: Moderate disc degeneration with disc bulging and spurring. Advanced facet hypertrophy bilaterally with mild to moderate spinal stenosis. Subarticular stenosis on the left. L5-S1: Bilateral pars defects of L5. 3 mm anterolisthesis. Negative for stenosis IMPRESSION: Advanced disc degeneration and Schmorl's node with surrounding edema on the right at L2-3. Subarticular stenosis on the right due to spurring. 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 Electronically Signed   By: Joseph [name] M.D.   On: 07/10/2017 13:05   Xr C-arm No Report  Result Date: 07/29/2017 Please see Notes or Procedures tab for imaging impression.    Examination:  General appearance: alert, cooperative and no distress  Wound Exam: clean, dry, intact   Drainage:  None: wound tissue dry  Motor Exam: EHL, FHL, Anterior Tibial and Posterior Tibial Intact  Sensory Exam: Superficial Peroneal, Deep Peroneal and Tibial normal  Vascular Exam: Normal  Assessment:    2 Days Post-Op  Procedure(s) (LRB): LEFT TOTAL KNEE ARTHROPLASTY (Left)  ADDITIONAL DIAGNOSIS:  Principal Problem:   Primary osteoarthritis of left knee Active Problems:   S/P total knee replacement using cement, left    Plan: Physical Therapy as ordered Partial Weight Bearing @ 50% (PWB)  DVT Prophylaxis:  Xarelto, Foot Pumps and TED hose  DISCHARGE PLAN: Home  DISCHARGE NEEDS: HHPT, CPM, Walker and 3-in-1 comode seat Dressing changed-wound clean and dry. Voiding without difficulty, good effort in PT-will D/C today       Joseph [name]  08/05/2017 9:47 AM  Patient ID: Joseph [name], male   DOB: 06-05-1941, 76 y.o.   MRN: [id number]

## 2017-08-05 NOTE — Progress Notes (Signed)
Physical Therapy Treatment Patient Details Name: Joseph [name] MRN: [id number] DOB: 1941/08/21 Today's Date: 08/05/2017    History of Present Illness Pt is 76 y/o male s/p elecive L TKA. PMH includes lung cancer, PVD, HTN, CAD, CHF, a fib, COPD, and renal disease.     PT Comments    Patient was very drowsy this session however able to complete HEP with cues and min A. Wife present throughout session. Current plan remains appropriate.    Follow Up Recommendations  DC plan and follow up therapy as arranged by surgeon;Supervision for mobility/OOB     Equipment Recommendations  None recommended by PT    Recommendations for Other Services       Precautions / Restrictions Precautions Precautions: Knee Precaution Booklet Issued: Yes (comment) Precaution Comments: positioning reviewed with pt Restrictions Weight Bearing Restrictions: Yes LLE Weight Bearing: Partial weight bearing LLE Partial Weight Bearing Percentage or Pounds: 50    Mobility  Bed Mobility Overal bed mobility: Modified Independent             General bed mobility comments: increased time and effort  Transfers Overall transfer level: Needs assistance Equipment used: Rolling walker (2 wheeled) Transfers: Sit to/from [facility] Sit to Stand: Min assist Stand pivot transfers: Min guard       General transfer comment: assist to power up and to steady upon standing; cues for safe hand placement  Ambulation/Gait Ambulation/Gait assistance: Min guard;Supervision Ambulation Distance (Feet): 120 Feet Assistive device: Rolling walker (2 wheeled) Gait Pattern/deviations: Decreased weight shift to left;Decreased stance time - left;Step-through pattern;Decreased step length - right Gait velocity: Decreased   General Gait Details: cues for posture, L heel strike, and breathing technique   Stairs     Stair Management: No rails;Step to pattern;Backwards;With walker Number of Stairs:  (2 steps X2) General stair comments: cues for sequencing and technique; wife stabilized RW  Wheelchair Mobility    Modified Rankin (Stroke Patients Only)       Balance Overall balance assessment: Needs assistance Sitting-balance support: No upper extremity supported;Feet supported Sitting balance-Leahy Scale: Good     Standing balance support: Bilateral upper extremity supported;During functional activity Standing balance-Leahy Scale: Poor                              Cognition Arousal/Alertness: Lethargic Behavior During Therapy: WFL for tasks assessed/performed Overall Cognitive Status: Within Functional Limits for tasks assessed                                        Exercises Total Joint Exercises Quad Sets: AROM;Left;10 reps Short Arc Quad: AROM;AAROM;Left;10 reps Heel Slides: AROM;AAROM;Left;10 reps Hip ABduction/ADduction: AROM;Left;10 reps Straight Leg Raises: AAROM;Left;10 reps Long Arc Quad: AROM;Left;10 reps Knee Flexion: AROM;Left;5 reps;Seated;Other (comment)(10 sec holds) Goniometric ROM: 90 degrees flexion    General Comments General comments (skin integrity, edema, etc.): SpO2 87% on RA while ambulating and up to 95% at rest      Pertinent Vitals/Pain Pain Assessment: Faces Faces Pain Scale: Hurts little more Pain Location: L knee  Pain Descriptors / Indicators: Sore;Aching Pain Intervention(s): Limited activity within patient's tolerance;Monitored during session;Premedicated before session;Repositioned    Home Living                      Prior Function  PT Goals (current goals can now be found in the care plan section) Acute Rehab PT Goals Patient Stated Goal: to go home  PT Goal Formulation: With patient Time For Goal Achievement: 08/10/17 Potential to Achieve Goals: Good Progress towards PT goals: Progressing toward goals    Frequency    7X/week      PT Plan Current plan remains  appropriate    Co-evaluation              AM-PAC PT "6 Clicks" Daily Activity  Outcome Measure  Difficulty turning over in bed (including adjusting bedclothes, sheets and blankets)?: A Little Difficulty moving from lying on back to sitting on the side of the bed? : A Little Difficulty sitting down on and standing up from a chair with arms (e.g., wheelchair, bedside commode, etc,.)?: Unable Help needed moving to and from a bed to chair (including a wheelchair)?: A Little Help needed walking in hospital room?: A Little Help needed climbing 3-5 steps with a railing? : A Little 6 Click Score: 16    End of Session Equipment Utilized During Treatment: Gait belt Activity Tolerance: Patient tolerated treatment well;Other (comment)(limited  by lethargy) Patient left: with call bell/phone within reach;in chair;with family/visitor present Nurse Communication: Mobility status PT Visit Diagnosis: Other abnormalities of gait and mobility (R26.89);Pain Pain - Right/Left: Left Pain - part of body: Knee     Time: 0131-4388 PT Time Calculation (min) (ACUTE ONLY): 37 min  Charges:   $Therapeutic Exercise: 23-37 mins                     G Codes:       Earney Navy, PTA Pager: [phone]     Darliss Cheney 08/05/2017, 2:29 PM

## 2017-08-05 NOTE — Discharge Summary (Signed)
[name], MD   Biagio Borg, PA-C [address] [place], [place]  [address]                             [phone]  PATIENT ID: Joseph [name]        MRN:  [id number]          DOB/AGE: 07-Mar-1941 / 76 y.o.    DISCHARGE SUMMARY  ADMISSION DATE:    08/03/2017 DISCHARGE DATE:   08/05/2017   ADMISSION DIAGNOSIS: S/P total knee replacement using cement, left [Z96.652]    DISCHARGE DIAGNOSIS:  Osteoarthritis Left Knee    ADDITIONAL DIAGNOSIS: Principal Problem:   Primary osteoarthritis of left knee Active Problems:   S/P total knee replacement using cement, left  Past Medical History:  Diagnosis Date  . Acute on chronic diastolic CHF (congestive heart failure), NYHA class 3 ([place]) 07/23/2014  . Arthritis    left knee and back arthrtis  . Atrial fibrillation with rapid ventricular response ([place]) 07/04/2014  . CAD (coronary artery disease)   . Cataract   . Cirrhosis ([place])    By radiography, seen on CT  . Diverticulosis   . Emphysema   . Enlarged prostate   . Headache    when younger but not any longer  . Heart murmur    teen  . History of abdominal aortic aneurysm (AAA)   . History of colon polyps 2006,2008  . History of transfusion 2015  . Hx of radiation therapy 09/02/11 to 10/19/11   chest  . Hx of radiation therapy   . Hyperlipidemia    takes Pravastatin  . Hypothyroidism    takes Synthroid daily  . Inguinal hernia   . Lung cancer ([place] [facility])    right upper lobe  . Personal history of adenomatous colonic polyps 07/04/2012   2006, adenomatous right colon polyp removed, then ablated in 2007 followup, 2008, adenomatous polyp of the right colon removed with hot biopsy forceps. All by Dr. Lajoyce Corners 01/03/2013 diminutive polyp ascending colon - adenoma    . Rosacea   . Shortness of breath    with exertion  . Small bowel obstruction ([place]) 07/05/2014  . Status post chemotherapy    last chemo 2013  . Umbilical hernia, incarcerated- surgery 07/10/14 07/05/2014     PROCEDURE: Procedure(s): LEFT TOTAL KNEE ARTHROPLASTY  on 08/03/2017  CONSULTS: none    HISTORY: Joseph [name], 76 y.o. male, has a history of pain and functional disability in the left knee due to arthritis and has failed non-surgical conservative treatments for greater than 12 weeks to includeNSAID's and/or analgesics, corticosteriod injections, viscosupplementation injections and activity modification.  Onset of symptoms was abrupt, starting 5 years ago with gradually worsening course since that time. The patient noted prior procedures on the knee to include  arthroscopy and menisectomy on the left knee(s).  Patient currently rates pain in the left knee(s) at 8 out of 10 with activity. Patient has night pain, worsening of pain with activity and weight bearing, pain that interferes with activities of daily living, pain with passive range of motion, crepitus and joint swelling.  Patient has evidence of subchondral cysts, subchondral sclerosis, periarticular osteophytes and joint space narrowing by imaging studies. There is no active infection.  HOSPITAL COURSE:  Joseph [name] is a 76 y.o. admitted on 08/03/2017 and found to have a diagnosis of Osteoarthritis Left Knee.  After appropriate laboratory studies were obtained  they were taken  to the operating room on 08/03/2017 and underwent  Procedure(s): LEFT TOTAL KNEE ARTHROPLASTY  .   They were given perioperative antibiotics:  Anti-infectives (From admission, onward)   Start     Dose/Rate Route Frequency Ordered Stop   08/03/17 1600  ceFAZolin (ANCEF) IVPB 2g/100 mL premix     2 g 200 mL/hr over 30 Minutes Intravenous Every 6 hours 08/03/17 1458 08/03/17 2306   08/03/17 0606  ceFAZolin (ANCEF) IVPB 2g/100 mL premix     2 g 200 mL/hr over 30 Minutes Intravenous On call to O.R. 08/03/17 0354 08/03/17 0727    .  Tolerated the procedure well.  Placed with a foley intraoperatively.    Toradol was given post op.  POD #1, allowed  out of bed to a chair.  PT for ambulation and exercise program.  Foley D/C'd in morning.  IV saline locked.  O2 discontionued.  POD #2, continued PT and ambulation.  Dressing changed. Wound clean and dry . The remainder of the hospital course was dedicated to ambulation and strengthening.   The patient was discharged on 2 Days Post-Op in  Stable condition.  Blood products given:none  DIAGNOSTIC STUDIES: Recent vital signs:  Patient Vitals for the past 24 hrs:  BP Temp Temp src Pulse Resp SpO2  08/05/17 0757 - - - - - 98 %  08/05/17 0625 138/62 98.3 F (36.8 C) Oral 88 20 99 %  08/04/17 2209 116/73 - - 90 - -  08/04/17 2117 (!) 90/56 98.1 F (36.7 C) Oral 90 20 98 %  08/04/17 1300 (!) 98/58 98 F (36.7 C) Oral 92 19 97 %  08/04/17 0956 (!) 95/53 98.5 F (36.9 C) Oral (!) 102 19 93 %       Recent laboratory studies: Recent Labs    08/04/17 0540 08/05/17 0554  WBC 6.0 8.7  HGB 12.9* 13.3  HCT 38.0* 38.7*  PLT 93* 114*   Recent Labs    08/04/17 0540 08/05/17 0554  NA 134* 133*  K 4.3 4.0  CL 105 103  CO2 21* 21*  BUN 22* 17  CREATININE 1.04 0.99  GLUCOSE 103* 111*  CALCIUM 7.8* 8.3*   Lab Results  Component Value Date   INR 1.08 07/21/2017   INR 1.1 (H) 06/21/2017   INR 1.29 11/01/2013     Recent Radiographic Studies :  Dg Chest 2 View  Result Date: 07/22/2017 CLINICAL DATA:  76 year old male status post left total knee arthroplasty EXAM: CHEST  2 VIEW COMPARISON:  Prior chest x-ray 03/21/2017 FINDINGS: Left subclavian approach port catheter remains in unchanged position. The catheter tip overlies the azygos vein. Stable cardiac and mediastinal contours. Atherosclerotic calcifications remain present in the transverse aorta. Marked pulmonary emphysema with bullous changes in both apices and right apical pleuroparenchymal scarring. No new focal airspace consolidation or suspicious appearing pulmonary mass or nodule. No acute osseous abnormality. The lungs remain  hyperinflated. IMPRESSION: 1. Stable chest x-ray without evidence of active cardiopulmonary disease. 2. Persistent hyperinflation an biapical bullous emphysematous changes. Emphysema (ICD10-J43.9). 3. Stable position of left subclavian approach port catheter. The catheter tip is likely within the azygos vein. 4.  Aortic Atherosclerosis (ICD10-170.0) Electronically Signed   By: Jacqulynn Cadet M.D.   On: 07/22/2017 10:56   Mr Lumbar Spine W/o Contrast  Result Date: 07/10/2017 CLINICAL DATA:  Low back pain with sciatica.  Right-sided pain EXAM: MRI LUMBAR SPINE WITHOUT CONTRAST TECHNIQUE: Multiplanar, multisequence MR imaging of the lumbar spine was performed. No intravenous  contrast was administered. COMPARISON:  None. FINDINGS: Segmentation:  Normal Alignment: 3 mm anterolisthesis L5-S1 due to bilateral pars defects of L5. Vertebrae: Negative for fracture or mass. Extensive discogenic changes at L2-3 on the right. Conus medullaris: Extends to the L1-2 level and appears normal. Paraspinal and other soft tissues: Negative Disc levels: L1-2:  Negative L2-3: Advanced disc degeneration on the right with disc space narrowing and extensive endplate edema due to Schmorl's node. Posterior endplate spurring is present causing mild spinal stenosis. Subarticular stenosis right greater than left. L3-4: Disc degeneration with diffuse disc bulging and spurring. No significant stenosis. Mild facet degeneration L4-5: Moderate disc degeneration with disc bulging and spurring. Advanced facet hypertrophy bilaterally with mild to moderate spinal stenosis. Subarticular stenosis on the left. L5-S1: Bilateral pars defects of L5. 3 mm anterolisthesis. Negative for stenosis IMPRESSION: Advanced disc degeneration and Schmorl's node with surrounding edema on the right at L2-3. Subarticular stenosis on the right due to spurring. 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 Electronically Signed   By: Franchot Gallo M.D.   On: 07/10/2017 13:05   Xr C-arm No Report  Result Date: 07/29/2017 Please see Notes or Procedures tab for imaging impression.   DISCHARGE INSTRUCTIONS: Discharge Instructions    CPM   Complete by:  As directed    Continuous passive motion machine (CPM):      Use the CPM from 0 to 60 degrees for 6-8 hours per day.      You may increase by 5-10 per day.  You may break it up into 2 or 3 sessions per day.      Use CPM for 3-4 weeks or until you are told to stop.   Call MD / Call 911   Complete by:  As directed    If you experience chest pain or shortness of breath, CALL 911 and be transported to the hospital emergency room.  If you develope a fever above 101 F, pus (white drainage) or increased drainage or redness at the wound, or calf pain, call your surgeon's office.   Change dressing   Complete by:  As directed    DO NOT CHANGE YOUR DRESSING.   Constipation Prevention   Complete by:  As directed    Drink plenty of fluids.  Prune juice may be helpful.  You may use a stool softener, such as Colace (over the counter) 100 mg twice a day.  Use MiraLax (over the counter) for constipation as needed.   Diet general   Complete by:  As directed    Discharge instructions   Complete by:  As directed    [place] items at home which could result in a fall. This includes throw rugs or furniture in walking pathways ICE to the affected joint every three hours while awake for 30 minutes at a time, for at least the first 3-5 days, and then as needed for pain and swelling.  Continue to use ice for pain and swelling. You may notice swelling that will progress down to the foot and ankle.  This is normal after surgery.  Elevate your leg when you are not up walking on it.   Continue to use the breathing machine you got in the hospital (incentive spirometer) which will help keep  your temperature down.  It is common for your temperature to cycle up and down following surgery,  especially at night when you are not up moving around and exerting yourself.  The breathing machine keeps your lungs expanded and your temperature down.   DIET:  As you were doing prior to hospitalization, we recommend a well-balanced diet.  DRESSING / WOUND CARE / SHOWERING  Keep the surgical dressing until follow up.  The dressing is water proof, so you can shower without any extra covering.  IF THE DRESSING FALLS OFF or the wound gets wet inside, change the dressing with sterile gauze.  Please use good hand washing techniques before changing the dressing.  Do not use any lotions or creams on the incision until instructed by your surgeon.    ACTIVITY  Increase activity slowly as tolerated, but follow the weight bearing instructions below.   No driving for 6 weeks or until further direction given by your physician.  You cannot drive while taking narcotics.  No lifting or carrying greater than 10 lbs. until further directed by your surgeon. Avoid periods of inactivity such as sitting longer than an hour when not asleep. This helps prevent blood clots.  You may return to work once you are authorized by your doctor.     WEIGHT BEARING   Partial weight bearing with assist device as directed.  50%   EXERCISES  Results after joint replacement surgery are often greatly improved when you follow the exercise, range of motion and muscle strengthening exercises prescribed by your doctor. Safety measures are also important to protect the joint from further injury. Any time any of these exercises cause you to have increased pain or swelling, decrease what you are doing until you are comfortable again and then slowly increase them. If you have problems or questions, call your caregiver or physical therapist for advice.   Rehabilitation is important following a joint replacement. After just a few days of  immobilization, the muscles of the leg can become weakened and shrink (atrophy).  These exercises are designed to build up the tone and strength of the thigh and leg muscles and to improve motion. Often times heat used for twenty to thirty minutes before working out will loosen up your tissues and help with improving the range of motion but do not use heat for the first two weeks following surgery (sometimes heat can increase post-operative swelling).   These exercises can be done on a training (exercise) mat,  on a table or on a bed. Use whatever works the best and is most comfortable for you.    Use music or television while you are exercising so that the exercises are a pleasant break in your day. This will make your life better with the exercises acting as a break in your routine that you can look forward to.   Perform all exercises about fifteen times, three times per day or as directed.  You should exercise both the operative leg and the other leg as well.   Exercises include:  Quad Sets - Tighten up the muscle on the front of the thigh (Quad) and hold for 5-10 seconds.   Straight Leg Raises - With your knee straight (if you were given a brace, keep it on), lift the leg to 60 degrees, hold for 3 seconds, and slowly lower the leg.  Perform this exercise against resistance later as your leg gets stronger.  Leg Slides: Lying on your back, slowly slide your foot toward your buttocks, bending your knee up off the floor (only go as far as is comfortable). Then slowly  slide your foot back down until your leg is flat on the floor again.  Angel Wings: Lying on your back spread your legs to the side as far apart as you can without causing discomfort.  Hamstring Strength:  Lying on your back, push your heel against the floor with your leg straight by tightening up the muscles of your buttocks.  Repeat, but this time bend your knee to a comfortable angle, and push your heel against the floor.  You may put a  pillow under the heel to make it more comfortable if necessary.   A rehabilitation program following joint replacement surgery can speed recovery and prevent re-injury in the future due to weakened muscles. Contact your doctor or a physical therapist for more information on knee rehabilitation.    CONSTIPATION  Constipation is defined medically as fewer than three stools per week and severe constipation as less than one stool per week.  Even if you have a regular bowel pattern at home, your normal regimen is likely to be disrupted due to multiple reasons following surgery.  Combination of anesthesia, postoperative narcotics, change in appetite and fluid intake all can affect your bowels.   YOU MUST use at least one of the following options; they are listed in order of increasing strength to get the job done.  They are all available over the counter, and you may need to use some, POSSIBLY even all of these options:    Drink plenty of fluids (prune juice may be helpful) and high fiber foods Colace 100 mg by mouth twice a day  Senokot for constipation as directed and as needed Dulcolax (bisacodyl), take with full glass of water  Miralax (polyethylene glycol) once or twice a day as needed.  If you have tried all these things and are unable to have a bowel movement in the first 3-4 days after surgery call either your surgeon or your primary doctor.    If you experience loose stools or diarrhea, hold the medications until you stool forms back up.  If your symptoms do not get better within 1 week or if they get worse, check with your doctor.  If you experience "the worst abdominal pain ever" or develop nausea or vomiting, please contact the office immediately for further recommendations for treatment.   ITCHING:  If you experience itching with your medications, try taking only a single pain pill, or even half a pain pill at a time.  You can also use Benadryl over the counter for itching or also to help  with sleep.   TED HOSE STOCKINGS:  Use stockings on both legs until for at least 2 weeks or as directed by physician office. They may be removed at night for sleeping.  MEDICATIONS:  See your medication summary on the "After Visit Summary" that nursing will review with you.  You may have some home medications which will be placed on hold until you complete the course of blood thinner medication.  It is important for you to complete the blood thinner medication as prescribed.  PRECAUTIONS:  If you experience chest pain or shortness of breath - call 911 immediately for transfer to the hospital emergency department.   If you develop a fever greater that 101 F, purulent drainage from wound, increased redness or drainage from wound, foul odor from the wound/dressing, or calf pain - CONTACT YOUR SURGEON.  FOLLOW-UP APPOINTMENTS:  If you do not already have a post-op appointment, please call the office for an appointment to be seen by your surgeon.  Guidelines for how soon to be seen are listed in your "After Visit Summary", but are typically between 1-4 weeks after surgery.  OTHER INSTRUCTIONS:   Knee Replacement:  Do not place pillow under knee, focus on keeping the knee straight while resting. CPM instructions: 0-90 degrees, 2 hours in the morning, 2 hours in the afternoon, and 2 hours in the evening. Place foam block, curve side up under heel at all times except when in CPM or when walking.  DO NOT modify, tear, cut, or change the foam block in any way.  MAKE SURE YOU:  Understand these instructions.  Get help right away if you are not doing well or get worse.    Thank you for letting us be a part of your medical care team.  It is a privilege we respect greatly.  We hope these instructions will help you stay on track for a fast and full recovery!   Do not put a pillow under the knee. Place it under the heel.   Complete by:  As directed    Driving  restrictions   Complete by:  As directed    No driving for 6 weeks   Increase activity slowly as tolerated   Complete by:  As directed    Lifting restrictions   Complete by:  As directed    No lifting for 6 weeks   Partial weight bearing   Complete by:  As directed    % Body Weight:  50%   Laterality:  left   Extremity:  Lower   Patient may shower   Complete by:  As directed    You may shower over your dressing   TED hose   Complete by:  As directed    Use stockings (TED hose) for 3 weeks on left  leg.  You may remove them at night for sleeping.      DISCHARGE MEDICATIONS:   Allergies as of 08/05/2017   No Known Allergies     Medication List    STOP taking these medications   aspirin EC 81 MG tablet   HYDROcodone-acetaminophen 5-325 MG tablet Commonly known as:  NORCO/VICODIN   OVER THE COUNTER MEDICATION     TAKE these medications   albuterol 108 (90 Base) MCG/ACT inhaler Commonly known as:  PROVENTIL HFA;VENTOLIN HFA Inhale 1-2 puffs into the lungs every 6 (six) hours as needed for wheezing or shortness of breath.   furosemide 20 MG tablet Commonly known as:  LASIX Take 1 tablet (20 mg total) by mouth daily.   Garlic 093 MG Tabs Take 2 tablets by mouth 2 (two) times daily.   methocarbamol 500 MG tablet Commonly known as:  ROBAXIN Take 1 tablet (500 mg total) by mouth every 6 (six) hours as needed for muscle spasms.   metoprolol tartrate 25 MG tablet Commonly known as:  LOPRESSOR TAKE 1 TABLET (25 MG TOTAL) BY MOUTH 2 (TWO) TIMES DAILY.   metroNIDAZOLE 0.75 % cream Commonly known as:  METROCREAM Apply 1 application topically 2 (two) times daily as needed.   multivitamin with minerals Tabs tablet Take 1 tablet by mouth daily.   nystatin cream Commonly known as:  MYCOSTATIN Apply 1 application topically 2 (two) times daily as needed for dry skin.   oxyCODONE 5 MG immediate release tablet Commonly known as:  Oxy IR/ROXICODONE Take  1-2 tablets (5-10  mg total) by mouth every 4 (four) hours as needed for moderate pain or severe pain ((score 4 to 6)).   PRESERVISION AREDS 2+MULTI VIT PO Take 1 capsule by mouth 2 (two) times daily.   PROBIOTIC DAILY PO Take 1 capsule by mouth daily. 30 Billion   PROSTATE PO Take 1 tablet by mouth 2 (two) times daily.   rivaroxaban 10 MG Tabs tablet Commonly known as:  XARELTO Take 1 tablet (10 mg total) by mouth daily with breakfast. Start taking on:  08/06/2017   SYNTHROID 175 MCG tablet Generic drug:  levothyroxine Take 175 mcg by mouth daily before breakfast.   tiotropium 18 MCG inhalation capsule Commonly known as:  SPIRIVA Place 18 mcg into inhaler and inhale daily.   triamcinolone cream 0.1 % Commonly known as:  KENALOG Apply 1 application topically 3 times/day as needed-between meals & bedtime (skin irriation).   vitamin B-12 1000 MCG tablet Commonly known as:  CYANOCOBALAMIN Take 1,000 mcg by mouth daily.            Durable Medical Equipment  (From admission, onward)        Start     Ordered   08/03/17 1459  DME Walker rolling  Once    Question:  Patient needs a walker to treat with the following condition  Answer:  S/P total knee replacement using cement, right   08/03/17 1458   08/03/17 1459  DME 3 n 1  Once     08/03/17 1458   08/03/17 1459  DME Bedside commode  Once    Question:  Patient needs a bedside commode to treat with the following condition  Answer:  S/P total knee replacement using cement, right   08/03/17 1458       Discharge Care Instructions  (From admission, onward)        Start     Ordered   08/05/17 0000  Partial weight bearing    Question Answer Comment  % Body Weight 50%   Laterality left   Extremity Lower      08/05/17 0946   08/05/17 0000  Change dressing    Comments:  DO NOT CHANGE YOUR DRESSING.   08/05/17 0946      FOLLOW UP VISIT:   Follow-up Information    [name], MD Follow up on 08/16/2017.   Specialty:   Orthopedic Surgery Contact information: [address] [place] [address] [phone]           DISPOSITION:   Home  CONDITION:  Stable   Mike Craze. [place], [place] [phone]  08/05/2017 9:48 AM

## 2017-08-05 NOTE — Care Management Note (Signed)
Case Management Note  Patient Details  Name: Joseph [name] MRN: [id number] Date of Birth: Mar 13, 1941  Subjective/Objective:  76 yr old gentleman s/p left total knee arthroplasty.                   Action/Plan: Case manager discussed discharge plan and DME with patient and his wife. Patient was preoperatively setup with [facility], no changes. Patient will have family support at discharge.     Expected Discharge Date:  08/05/17               Expected Discharge Plan:  [place]  In-House Referral:     Discharge planning Services  CM Consult  Post Acute Care Choice:  Durable Medical Equipment, Home Health Choice offered to:  Patient, Spouse  DME Arranged:  3-N-1, CPM, Walker rolling DME Agency:  [employer]  HH Arranged:  PT [place]:  [facility] [employer]  Status of Service:  Completed, signed off  If discussed at [employer], dates discussed:    Additional Comments:  Ninfa Meeker, RN 08/05/2017, 10:03 AM

## 2017-08-05 NOTE — Progress Notes (Signed)
Orthopedic Tech Progress Note Patient Details:  Joseph [name] 07/21/1941 [id number]  CPM Left Knee CPM Left Knee: On Left Knee Flexion (Degrees): 90 Left Knee Extension (Degrees): 0 Additional Comments: Left knee  Post Interventions Patient Tolerated: Well Instructions Provided: Care of device  [name] 08/05/2017, 3:14 PM

## 2017-08-06 ENCOUNTER — Telehealth (INDEPENDENT_AMBULATORY_CARE_PROVIDER_SITE_OTHER): Payer: Self-pay | Admitting: Orthopaedic Surgery

## 2017-08-06 DIAGNOSIS — I739 Peripheral vascular disease, unspecified: Secondary | ICD-10-CM | POA: Diagnosis not present

## 2017-08-06 DIAGNOSIS — K7460 Unspecified cirrhosis of liver: Secondary | ICD-10-CM | POA: Diagnosis not present

## 2017-08-06 DIAGNOSIS — Z85118 Personal history of other malignant neoplasm of bronchus and lung: Secondary | ICD-10-CM | POA: Diagnosis not present

## 2017-08-06 DIAGNOSIS — Z96652 Presence of left artificial knee joint: Secondary | ICD-10-CM | POA: Diagnosis not present

## 2017-08-06 DIAGNOSIS — I4891 Unspecified atrial fibrillation: Secondary | ICD-10-CM | POA: Diagnosis not present

## 2017-08-06 DIAGNOSIS — M1712 Unilateral primary osteoarthritis, left knee: Secondary | ICD-10-CM | POA: Diagnosis not present

## 2017-08-06 DIAGNOSIS — J431 Panlobular emphysema: Secondary | ICD-10-CM | POA: Diagnosis not present

## 2017-08-06 DIAGNOSIS — I110 Hypertensive heart disease with heart failure: Secondary | ICD-10-CM | POA: Diagnosis not present

## 2017-08-06 DIAGNOSIS — Z471 Aftercare following joint replacement surgery: Secondary | ICD-10-CM | POA: Diagnosis not present

## 2017-08-06 DIAGNOSIS — I2510 Atherosclerotic heart disease of native coronary artery without angina pectoris: Secondary | ICD-10-CM | POA: Diagnosis not present

## 2017-08-06 DIAGNOSIS — Z79891 Long term (current) use of opiate analgesic: Secondary | ICD-10-CM | POA: Diagnosis not present

## 2017-08-06 DIAGNOSIS — I5033 Acute on chronic diastolic (congestive) heart failure: Secondary | ICD-10-CM | POA: Diagnosis not present

## 2017-08-06 DIAGNOSIS — I700 Atherosclerosis of aorta: Secondary | ICD-10-CM | POA: Diagnosis not present

## 2017-08-06 DIAGNOSIS — H269 Unspecified cataract: Secondary | ICD-10-CM | POA: Diagnosis not present

## 2017-08-06 NOTE — Telephone Encounter (Signed)
Peter Congo from the [facility] left a vm message requesting a VO for [facility] PT 3x a week for 2 weeks.  CB#[phone].  Thank you.

## 2017-08-06 NOTE — Telephone Encounter (Signed)
Called and OK per PW

## 2017-08-06 NOTE — Telephone Encounter (Signed)
Patient's wife called regarding the patient's CPM machine.  He was discharged from the hospital yesterday and have not heard anything about the machine.  CB#[phone].  Thank you.

## 2017-08-07 ENCOUNTER — Other Ambulatory Visit: Payer: Self-pay | Admitting: Nurse Practitioner

## 2017-08-09 ENCOUNTER — Inpatient Hospital Stay (INDEPENDENT_AMBULATORY_CARE_PROVIDER_SITE_OTHER): Payer: PPO | Admitting: Orthopaedic Surgery

## 2017-08-10 NOTE — Telephone Encounter (Signed)
Sppoke with pt and he did get the CPM machine. PT visits are scheduled. Pt doing well with the [pain, 4-5 oxycodone a day

## 2017-08-11 DIAGNOSIS — I739 Peripheral vascular disease, unspecified: Secondary | ICD-10-CM | POA: Diagnosis not present

## 2017-08-11 DIAGNOSIS — I5033 Acute on chronic diastolic (congestive) heart failure: Secondary | ICD-10-CM | POA: Diagnosis not present

## 2017-08-11 DIAGNOSIS — H269 Unspecified cataract: Secondary | ICD-10-CM | POA: Diagnosis not present

## 2017-08-11 DIAGNOSIS — I700 Atherosclerosis of aorta: Secondary | ICD-10-CM | POA: Diagnosis not present

## 2017-08-11 DIAGNOSIS — I110 Hypertensive heart disease with heart failure: Secondary | ICD-10-CM | POA: Diagnosis not present

## 2017-08-11 DIAGNOSIS — I4891 Unspecified atrial fibrillation: Secondary | ICD-10-CM | POA: Diagnosis not present

## 2017-08-11 DIAGNOSIS — I2510 Atherosclerotic heart disease of native coronary artery without angina pectoris: Secondary | ICD-10-CM | POA: Diagnosis not present

## 2017-08-11 DIAGNOSIS — J431 Panlobular emphysema: Secondary | ICD-10-CM | POA: Diagnosis not present

## 2017-08-11 DIAGNOSIS — Z96652 Presence of left artificial knee joint: Secondary | ICD-10-CM | POA: Diagnosis not present

## 2017-08-11 DIAGNOSIS — Z471 Aftercare following joint replacement surgery: Secondary | ICD-10-CM | POA: Diagnosis not present

## 2017-08-11 DIAGNOSIS — Z85118 Personal history of other malignant neoplasm of bronchus and lung: Secondary | ICD-10-CM | POA: Diagnosis not present

## 2017-08-11 DIAGNOSIS — K7460 Unspecified cirrhosis of liver: Secondary | ICD-10-CM | POA: Diagnosis not present

## 2017-08-11 DIAGNOSIS — Z79891 Long term (current) use of opiate analgesic: Secondary | ICD-10-CM | POA: Diagnosis not present

## 2017-08-16 ENCOUNTER — Encounter (INDEPENDENT_AMBULATORY_CARE_PROVIDER_SITE_OTHER): Payer: Self-pay | Admitting: Orthopaedic Surgery

## 2017-08-16 ENCOUNTER — Ambulatory Visit (INDEPENDENT_AMBULATORY_CARE_PROVIDER_SITE_OTHER): Payer: PPO | Admitting: Orthopaedic Surgery

## 2017-08-16 ENCOUNTER — Ambulatory Visit (INDEPENDENT_AMBULATORY_CARE_PROVIDER_SITE_OTHER): Payer: PPO

## 2017-08-16 VITALS — BP 121/64 | HR 89 | Resp 16 | Ht 74.0 in | Wt 210.0 lb

## 2017-08-16 DIAGNOSIS — I2510 Atherosclerotic heart disease of native coronary artery without angina pectoris: Secondary | ICD-10-CM | POA: Diagnosis not present

## 2017-08-16 DIAGNOSIS — Z96652 Presence of left artificial knee joint: Secondary | ICD-10-CM

## 2017-08-16 DIAGNOSIS — I700 Atherosclerosis of aorta: Secondary | ICD-10-CM | POA: Diagnosis not present

## 2017-08-16 DIAGNOSIS — I4891 Unspecified atrial fibrillation: Secondary | ICD-10-CM | POA: Diagnosis not present

## 2017-08-16 DIAGNOSIS — H269 Unspecified cataract: Secondary | ICD-10-CM | POA: Diagnosis not present

## 2017-08-16 DIAGNOSIS — I110 Hypertensive heart disease with heart failure: Secondary | ICD-10-CM | POA: Diagnosis not present

## 2017-08-16 DIAGNOSIS — K7460 Unspecified cirrhosis of liver: Secondary | ICD-10-CM | POA: Diagnosis not present

## 2017-08-16 DIAGNOSIS — Z471 Aftercare following joint replacement surgery: Secondary | ICD-10-CM | POA: Diagnosis not present

## 2017-08-16 DIAGNOSIS — I739 Peripheral vascular disease, unspecified: Secondary | ICD-10-CM | POA: Diagnosis not present

## 2017-08-16 DIAGNOSIS — J431 Panlobular emphysema: Secondary | ICD-10-CM | POA: Diagnosis not present

## 2017-08-16 DIAGNOSIS — Z79891 Long term (current) use of opiate analgesic: Secondary | ICD-10-CM | POA: Diagnosis not present

## 2017-08-16 DIAGNOSIS — I5033 Acute on chronic diastolic (congestive) heart failure: Secondary | ICD-10-CM | POA: Diagnosis not present

## 2017-08-16 DIAGNOSIS — Z85118 Personal history of other malignant neoplasm of bronchus and lung: Secondary | ICD-10-CM | POA: Diagnosis not present

## 2017-08-16 MED ORDER — OXYCODONE HCL 5 MG PO TABS: 5.0000 mg | ORAL_TABLET | Freq: Three times a day (TID) | ORAL | 0 refills | Status: DC | PRN

## 2017-08-16 NOTE — Progress Notes (Signed)
Office Visit Note   Patient: Joseph [name]           Date of Birth: 1941/03/26           MRN: [id number] Visit Date: 08/16/2017              Requested by: [name], MD [address] [place], [place] [address] PCP: [name], MD   Assessment & Plan: Visit Diagnoses:  1. Presence of left artificial knee joint     Plan: 2 weeks status post primary left total knee replacement doing well. No fever chills shortness of breath or chest pain. May now weight-bear to tolerance with a cane and return in 2 weeks. New prescription for OxyIR 5 mg.. Stop Xarelto Follow-Up Instructions: No Follow-up on file.   Orders:  Orders Placed This Encounter  Procedures  . XR KNEE 3 VIEW LEFT   Meds ordered this encounter  Medications  . oxyCODONE (OXY IR/ROXICODONE) 5 MG immediate release tablet    Sig: Take 1 tablet (5 mg total) by mouth every 8 (eight) hours as needed for moderate pain or severe pain ((score 4 to 6)).    Dispense:  30 tablet    Refill:  0      Procedures: No procedures performed   Clinical Data: No additional findings.   Subjective: Chief Complaint  Patient presents with  . Left Knee - Routine Post Op    Joseph [name] is a 76 y o S/P 2 weeks L TKA Ambulating with a walker. Pt started. Oxycodone at  Bedtime.   Doing well without problem. Independent with a walker. Good effort in PT. No distal edema shortness of breath or chest pain   HPI  Review of Systems  Constitutional: Negative for fatigue.  HENT: Negative for hearing loss.   Respiratory: Negative for apnea, chest tightness and shortness of breath.   Cardiovascular: Negative for chest pain, palpitations and leg swelling.  Gastrointestinal: Negative for blood in stool, constipation and diarrhea.  Genitourinary: Negative for difficulty urinating.  Musculoskeletal: Positive for back pain. Negative for arthralgias, joint swelling, myalgias, neck pain and neck stiffness.  Neurological: Positive for  weakness. Negative for numbness and headaches.  Hematological: Does not bruise/bleed easily.  Psychiatric/Behavioral: Positive for sleep disturbance. The patient is not nervous/anxious.      Objective: Vital Signs: BP 121/64   Pulse 89   Resp 16   Ht 6\' 2"  (1.88 m)   Wt 210 lb (95.3 kg)   BMI 26.96 kg/m   Physical Exam  Ortho Exam left knee wound healing without problem. Staples removed, Steri-Strips applied. Full extension and flexed over 100 without instability. No calf pain or popliteal masses no swelling distally neurovascular exam intact.   Specialty Comments:  No specialty comments available.  Imaging: Xr Knee 3 View Left  Result Date: 08/16/2017 Films of the left total knee replacement performed in 3 projections. Alignment is anatomic. No complicating factors.    PMFS History: Patient Active Problem List   Diagnosis Date Noted  . Primary osteoarthritis of left knee 08/03/2017  . S/P total knee replacement using cement, left 08/03/2017  . SBO (small bowel obstruction) ([place]) 03/18/2017  . Elevated d-dimer   . Hypoxia   . SOB (shortness of breath)   . Small bowel obstruction ([place]) 10/20/2016  . AKI (acute kidney injury) ([place]) 10/20/2016  . Acute respiratory failure with hypoxia ([place]) 10/20/2016  . Chronic pain of left knee 07/06/2016  . Port catheter in place 02/27/2016  .  Claudication ([place]) 02/18/2016  . Statin intolerance 02/18/2016  . Essential hypertension 02/18/2016  . Coronary artery disease due to lipid rich plaque 08/29/2015  . Acute on chronic diastolic CHF (congestive heart failure), NYHA class 2 ([place]) 08/29/2015  . 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 Hypoxemia requiring supplemental oxygen   . Atrial fibrillation ([place]) 07/23/2014  . COPD (chronic obstructive pulmonary disease) ([place]) 07/06/2014  . Hypothyroidism 07/06/2014  . Numbness-Left Thigh 12/14/2013  . AAA- s/p Ao-Iliac BPG March 2015  10/20/2013  . Hyperlipidemia 10/09/2013  . Hepatic cirrhosis ([place]) 12/02/2012  . Cancer of right lung ([place]) 08/06/2011   Past Medical History:  Diagnosis Date  . Acute on chronic diastolic CHF (congestive heart failure), NYHA class 3 ([place]) 07/23/2014  . Arthritis    left knee and back arthrtis  . Atrial fibrillation with rapid ventricular response ([place]) 07/04/2014  . CAD (coronary artery disease)   . Cataract   . Cirrhosis ([place])    By radiography, seen on CT  . Diverticulosis   . Emphysema   . Enlarged prostate   . Headache    when younger but not any longer  . Heart murmur    teen  . History of abdominal aortic aneurysm (AAA)   . History of colon polyps 2006,2008  . History of transfusion 2015  . Hx of radiation therapy 09/02/11 to 10/19/11   chest  . Hx of radiation therapy   . Hyperlipidemia    takes Pravastatin  . Hypothyroidism    takes Synthroid daily  . Inguinal hernia   . Lung cancer ([place])    right upper lobe  . Personal history of adenomatous colonic polyps 07/04/2012   2006, adenomatous right colon polyp removed, then ablated in 2007 followup, 2008, adenomatous polyp of the right colon removed with hot biopsy forceps. All by Dr. Lajoyce Corners 01/03/2013 diminutive polyp ascending colon - adenoma    . Rosacea   . Shortness of breath    with exertion  . Small bowel obstruction ([place]) 07/05/2014  . Status post chemotherapy    last chemo 2013  . Umbilical hernia, incarcerated- surgery 07/10/14 07/05/2014    Family History  Problem Relation Age of Onset  . Cancer Father   . Diabetes Father   . Hyperlipidemia Mother   . Hypertension Mother   . Hyperlipidemia Brother   . AAA (abdominal aortic aneurysm) Brother   . Hypertension Brother   . Hyperlipidemia Sister   . Hypertension Sister   . Anesthesia problems Neg Hx   . Hypotension Neg Hx   . Malignant hyperthermia Neg Hx   . Pseudochol deficiency Neg Hx   . Colon cancer Neg Hx     Past Surgical History:  Procedure  Laterality Date  . ABDOMINAL AORTAGRAM  10/20/2013   Procedure: ABDOMINAL Maxcine Ham;  Surgeon: Elam Dutch, MD;  Location: [facility] CATH LAB;  Service: Cardiovascular;;  . ABDOMINAL AORTIC ANEURYSM REPAIR N/A 10/31/2013   Procedure: aorto to left external iliac and right external iliac and left internal iliac, reimplantation of inferior mesenteric artery and ligation of left iliac artery aneursym;  Surgeon: Elam Dutch, MD;  Location: [facility];  Service: Vascular;  Laterality: N/A;  . ABDOMINAL WOUND DEHISCENCE N/A 11/10/2013   Procedure: ABDOMINAL WOUND CLOSURE ;  Surgeon: Serafina Mitchell, MD;  Location: [facility];  Service: Vascular;  Laterality: N/A;  . bletharoplasty    . BOWEL RESECTION N/A 07/10/2014   Procedure: SMALL BOWEL  RESECTION;  Surgeon: [name], MD;  Location: [facility];  Service: General;  Laterality: N/A;  . CARDIAC CATHETERIZATION  2003  . CATARACT EXTRACTION, BILATERAL    . COLONOSCOPY W/ BIOPSIES     multiple   . ESOPHAGOGASTRODUODENOSCOPY  2006  . FINGER SURGERY  2008   left pointer finger  . HERNIA REPAIR  70's   x3  . INCISIONAL HERNIA REPAIR N/A 07/10/2014   Procedure: HERNIA REPAIR INCISIONAL;  Surgeon: [name], MD;  Location: [facility];  Service: General;  Laterality: N/A;  . KNEE ARTHROSCOPY  2008   left knee  . LAPAROTOMY N/A 07/10/2014   Procedure: EXPLORATORY LAPAROTOMY;  Surgeon: [name], MD;  Location: [facility];  Service: General;  Laterality: N/A;  . LOWER EXTREMITY ANGIOGRAM Bilateral 10/20/2013   Procedure: LOWER EXTREMITY ANGIOGRAM;  Surgeon: Elam Dutch, MD;  Location[facility] CATH LAB;  Service: Cardiovascular;  Laterality: Bilateral;  . LYSIS OF ADHESION N/A 07/10/2014   Procedure: LYSIS OF ADHESION - 90 minutes;  Surgeon: [name], MD;  Location: [place];  Service: General;  Laterality: N/A;  . PORTACATH PLACEMENT  08/10/2011   Procedure: INSERTION PORT-A-CATH;  Surgeon: Pierre Bali, MD;  Location: [place];  Service: Thoracic;  Laterality:  Left;  Left Subclavian  . PTOSIS REPAIR Bilateral 12-02-2015  . THROMBECTOMY ILIAC ARTERY Bilateral 10/31/2013   Procedure: THROMBECTOMY ILIAC ARTERY;  Surgeon: Elam Dutch, MD;  Location: [place];  Service: Vascular;  Laterality: Bilateral;  . TONSILLECTOMY     as a child  . TOTAL KNEE ARTHROPLASTY Left 08/03/2017   Procedure: LEFT TOTAL KNEE ARTHROPLASTY;  Surgeon: [name], MD;  Location: [facility];  Service: Orthopedics;  Laterality: Left;  Marland Kitchen VASECTOMY  70's  . VENTRAL HERNIA REPAIR N/A 04/04/2015   Procedure: OPEN REPAIR OF RECURRENT ABDOMINAL WALL HERNIA WITH MESH, COMPLEX LYSIS OF ADHESIONS X1 HR TAR COMPONENT SEPARATION;  Surgeon: Greer Pickerel, MD;  [facility];  Service: General;  Laterality: N/A;   Social History   Occupational History  . Not on file  Tobacco Use  . Smoking status: Former Smoker    Packs/day: 1.50    Years: 40.00    Pack years: 60.00    Types: Cigarettes    Last attempt to quit: 07/02/2011    Years since quitting: 6.1  . Smokeless tobacco: Former Network engineer and Sexual Activity  . Alcohol use: Yes    Comment: social - 2 MONTHLY  . Drug use: No  . Sexual activity: No

## 2017-08-20 ENCOUNTER — Other Ambulatory Visit (INDEPENDENT_AMBULATORY_CARE_PROVIDER_SITE_OTHER): Payer: Self-pay

## 2017-08-20 ENCOUNTER — Telehealth: Payer: Self-pay | Admitting: Orthopaedic Surgery

## 2017-08-20 DIAGNOSIS — Z96652 Presence of left artificial knee joint: Secondary | ICD-10-CM

## 2017-08-20 NOTE — Telephone Encounter (Signed)
Patient's in-home Physical Therapy ends today.  Patient is scheduled to see the doctor on 09/03/17 but wants to know if he needs to be referred to out patient physical therapy. CB# [phone]  Please advise

## 2017-08-20 NOTE — Telephone Encounter (Signed)
[place] for OP PT

## 2017-08-20 NOTE — Telephone Encounter (Signed)
Sent referral 

## 2017-08-20 NOTE — Telephone Encounter (Signed)
Please advise 

## 2017-08-30 ENCOUNTER — Ambulatory Visit: Payer: PPO | Attending: Orthopaedic Surgery | Admitting: Physical Therapy

## 2017-08-30 DIAGNOSIS — R600 Localized edema: Secondary | ICD-10-CM | POA: Diagnosis not present

## 2017-08-30 DIAGNOSIS — M25562 Pain in left knee: Secondary | ICD-10-CM | POA: Insufficient documentation

## 2017-08-30 DIAGNOSIS — R262 Difficulty in walking, not elsewhere classified: Secondary | ICD-10-CM | POA: Diagnosis not present

## 2017-08-30 NOTE — Therapy (Addendum)
[place], [place], [address] Phone: [phone]   Fax:  [phone]  Physical Therapy Evaluation  Patient Details  Name: Joseph [name] MRN: [id number] Date of Birth: 1940-10-31 Referring Provider: Dr. [name]   Encounter Date: 08/30/2017  PT End of Session - 08/30/17 1203    Visit Number  1    Number of Visits  12    Date for PT Re-Evaluation  10/11/17    PT Start Time  1100    PT Stop Time  1150    PT Time Calculation (min)  50 min    Activity Tolerance  Patient tolerated treatment well    Behavior During Therapy  [facility] for tasks assessed/performed       Past Medical History:  Diagnosis Date  . Acute on chronic diastolic CHF (congestive heart failure), NYHA class 3 ([place]) 07/23/2014  . Arthritis    left knee and back arthrtis  . Atrial fibrillation with rapid ventricular response ([place]) 07/04/2014  . CAD (coronary artery disease)   . Cataract   . Cirrhosis ([place])    By radiography, seen on CT  . Diverticulosis   . Emphysema   . Enlarged prostate   . Headache    when younger but not any longer  . Heart murmur    teen  . History of abdominal aortic aneurysm (AAA)   . History of colon polyps 2006,2008  . History of transfusion 2015  . Hx of radiation therapy 09/02/11 to 10/19/11   chest  . Hx of radiation therapy   . Hyperlipidemia    takes Pravastatin  . Hypothyroidism    takes Synthroid daily  . Inguinal hernia   . Lung cancer ([place])    right upper lobe  . Personal history of adenomatous colonic polyps 07/04/2012   2006, adenomatous right colon polyp removed, then ablated in 2007 followup, 2008, adenomatous polyp of the right colon removed with hot biopsy forceps. All by Dr. Lajoyce Corners 01/03/2013 diminutive polyp ascending colon - adenoma    . Rosacea   . Shortness of breath    with exertion  . Small bowel obstruction ([place]) 07/05/2014  . Status post chemotherapy    last chemo 2013  .  Umbilical hernia, incarcerated- surgery 07/10/14 07/05/2014    Past Surgical History:  Procedure Laterality Date  . ABDOMINAL AORTAGRAM  10/20/2013   Procedure: ABDOMINAL Maxcine Ham;  Surgeon: Elam Dutch, MD;  Location: [facility] CATH LAB;  Service: Cardiovascular;;  . ABDOMINAL AORTIC ANEURYSM REPAIR N/A 10/31/2013   Procedure: aorto to left external iliac and right external iliac and left internal iliac, reimplantation of inferior mesenteric artery and ligation of left iliac artery aneursym;  Surgeon: Elam Dutch, MD;  Location: [place];  Service: Vascular;  Laterality: N/A;  . ABDOMINAL WOUND DEHISCENCE N/A 11/10/2013   Procedure: ABDOMINAL WOUND CLOSURE ;  Surgeon: Serafina Mitchell, MD;  Location: [place];  Service: Vascular;  Laterality: N/A;  . bletharoplasty    . BOWEL RESECTION N/A 07/10/2014   Procedure: SMALL BOWEL RESECTION;  Surgeon: [name], MD;  Location: [facility];  Service: General;  Laterality: N/A;  . CARDIAC CATHETERIZATION  2003  . CATARACT EXTRACTION, BILATERAL    . COLONOSCOPY W/ BIOPSIES     multiple   . ESOPHAGOGASTRODUODENOSCOPY  2006  . FINGER SURGERY  2008   left pointer finger  . HERNIA REPAIR  70's   x3  . INCISIONAL HERNIA REPAIR N/A 07/10/2014  Procedure: HERNIA REPAIR INCISIONAL;  Surgeon: [name], MD;  Location: [facility];  Service: General;  Laterality: N/A;  . KNEE ARTHROSCOPY  2008   left knee  . LAPAROTOMY N/A 07/10/2014   Procedure: EXPLORATORY LAPAROTOMY;  Surgeon: [name], MD;  Location: [facility];  Service: General;  Laterality: N/A;  . LOWER EXTREMITY ANGIOGRAM Bilateral 10/20/2013   Procedure: LOWER EXTREMITY ANGIOGRAM;  Surgeon: Elam Dutch, MD;  Location[facility] CATH LAB;  Service: Cardiovascular;  Laterality: Bilateral;  . LYSIS OF ADHESION N/A 07/10/2014   Procedure: LYSIS OF ADHESION - 90 minutes;  Surgeon: [name], MD;  Location: [place];  Service: General;  Laterality: N/A;  . PORTACATH PLACEMENT  08/10/2011   Procedure:  INSERTION PORT-A-CATH;  Surgeon: Pierre Bali, MD;  Location: [place];  Service: Thoracic;  Laterality: Left;  Left Subclavian  . PTOSIS REPAIR Bilateral 12-02-2015  . THROMBECTOMY ILIAC ARTERY Bilateral 10/31/2013   Procedure: THROMBECTOMY ILIAC ARTERY;  Surgeon: Elam Dutch, MD;  Location: [place];  Service: Vascular;  Laterality: Bilateral;  . TONSILLECTOMY     as a child  . TOTAL KNEE ARTHROPLASTY Left 08/03/2017   Procedure: LEFT TOTAL KNEE ARTHROPLASTY;  Surgeon: [name], MD;  Location: [place];  Service: Orthopedics;  Laterality: Left;  Marland Kitchen VASECTOMY  70's  . VENTRAL HERNIA REPAIR N/A 04/04/2015   Procedure: OPEN REPAIR OF RECURRENT ABDOMINAL WALL HERNIA WITH MESH, COMPLEX LYSIS OF ADHESIONS X1 HR TAR COMPONENT SEPARATION;  Surgeon: Greer Pickerel, MD;  [facility];  Service: General;  Laterality: N/A;    There were no vitals filed for this visit.   Subjective Assessment - 08/30/17 1103    Subjective  Pt underwent L TKA 25/3/66 without complications.  He had HHPT for a few weeks.  He has pain mostly at night.  Was on CPM for 3 weeks.  He has mild difficulty with walking, standing, has recently stopped and reduced his dependence on the cane.  He has diff sitting too long as well.      Pertinent History  Cancer     Limitations  Sitting    How long can you sit comfortably?  in the car , driving 44-03 min     How long can you walk comfortably?  <10 min     Patient Stated Goals  Pt would like to be able to play golf again, resume normal activities.     Currently in Pain?  Yes    Pain Location  Knee    Pain Orientation  Left    Pain Descriptors / Indicators  Discomfort;Aching    Pain Type  Surgical pain;Acute pain    Pain Radiating Towards  lower leg     Pain Onset  1 to 4 weeks ago    Pain Frequency  Intermittent    Aggravating Factors   PM, stretching, sit or stand too long     Pain Relieving Factors  ice, propping         [facility] PT Assessment - 08/30/17 0001       Assessment   Medical Diagnosis  L TKA    Referring Provider  Dr. 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, active       Cognition   Overall Cognitive Status  Within Functional Limits for tasks assessed      Observation/Other Assessments   Focus on Therapeutic Outcomes (FOTO)   67%      Circumferential Edema   Circumferential - Right  17 3/4 inch     Circumferential - Left   18 1/2 inch       Sensation   Light Touch  Appears Intact      AROM   Right Knee Extension  5    Right Knee Flexion  140    Left Knee Extension  11    Left Knee Flexion  112 118 with AAROM       Strength   Left Hip ABduction  4+/5    Left Knee Flexion  4+/5    Left Knee Extension  4+/5 fair quad contraction       Palpation   Patella mobility  good    Palpation comment  TTP distal thigh and along incision              Objective measurements completed on examination: See above findings.      [facility] Adult PT Treatment/Exercise - 08/30/17 0001      Knee/Hip Exercises: Stretches   Active Hamstring Stretch  Left;3 reps    Knee: Self-Stretch to increase Flexion  Left;3 reps      Knee/Hip Exercises: Aerobic   Stationary Bike  5 min L2, pt completed 1 mile       Moist Heat Therapy   Number Minutes Moist Heat  8 Minutes    Moist Heat Location  Knee  posterior       Cryotherapy   Number Minutes Cryotherapy  8 Minutes    Cryotherapy Location  Knee    Type of Cryotherapy  Ice pack             PT Education - 08/30/17 1203    Education provided  Yes    Education Details  PT/POC, HEP for flexibility     Person(s) Educated  Patient    Methods  Explanation;Handout;Verbal cues    Comprehension  Verbalized understanding;Returned demonstration          PT Long Term Goals - 08/30/17 1204      PT LONG TERM GOAL #1   Title  Pt will improve FOTO score to <50% impaired to demo improvement in functional mobility.     Time  6    Period  Weeks    Status  New    Target Date  10/11/17      PT LONG TERM GOAL #2   Title  He will be independent with all HEP issued    Time  6    Period  Weeks    Status  New    Target Date  10/11/17      PT LONG TERM GOAL #3   Title  Pt will be able to walk as needed in the community without limitation of pain.     Time  6    Period  Weeks    Status  New    Target Date  10/11/17      PT LONG TERM GOAL #4   Title  He will report gradual return to gym to increase fitness and lose weight    Time  6    Period  Weeks    Status  New    Target Date  10/11/17      PT LONG TERM GOAL #5   Title  Pt will demo Lt knee AROM in extension to lacking no more than 5 deg for proper gait mechanics     Time  6    Period  Weeks    Status  New    Target Date  10/11/17      G-CODE Current: CL  Goal: CK       Plan - 08/30/17 1207    Clinical Impression Statement  Pt presents for low complexity eval for L knee arthroplasty which limits his ability to stand, walk, sit and perform normal daily activities.  He lacks full ROM extension, flexion was quite good today at 118 deg with AAROM.  He will benefit from PT to maximize functional mobility and overall fitness.     Clinical Presentation  Stable    Clinical Decision Making  Low    Rehab Potential  Excellent    PT Frequency  2x / week    PT Duration  6  weeks    PT Treatment/Interventions  ADLs/Self Care Home Management;Patient/family education;Passive range of motion;Therapeutic activities;Moist Heat;Cryotherapy;Electrical Stimulation;Gait training;Neuromuscular re-education;Manual techniques;Taping;Manual lymph drainage;Vasopneumatic Device;Therapeutic exercise;Balance training    PT Next Visit Plan  Progress ROM and strength, bike, standing ex     PT Home Exercise Plan  issued HEP for knee flexibility     Consulted and Agree with Plan of Care  Patient       Patient will benefit from skilled therapeutic intervention in order to improve the following deficits and impairments:  Decreased range of motion, Difficulty walking, Increased fascial restricitons, Pain, Impaired flexibility, Hypomobility, Decreased balance, Decreased mobility, Decreased strength, Increased edema  Visit Diagnosis: Localized edema  Acute pain of left knee  Difficulty in walking, not elsewhere classified     Problem List Patient Active Problem List   Diagnosis Date Noted  . Primary osteoarthritis of left knee 08/03/2017  . S/P total knee replacement using cement, left 08/03/2017  . SBO (small bowel obstruction) ([place]) 03/18/2017  . Elevated d-dimer   . Hypoxia   . SOB (shortness of breath)   . Small bowel obstruction ([place]) 10/20/2016  . AKI (acute kidney injury) ([place]) 10/20/2016  . Acute respiratory failure with hypoxia ([place]) 10/20/2016  . Chronic pain of left knee 07/06/2016  . Port catheter in place 02/27/2016  . Claudication ([place]) 02/18/2016  . Statin intolerance 02/18/2016  . Essential hypertension 02/18/2016  . Coronary artery disease due to lipid rich plaque 08/29/2015  . Acute on chronic diastolic CHF (congestive heart failure), NYHA class 2 ([place]) 08/29/2015  . 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 Hypoxemia requiring supplemental oxygen   . Atrial fibrillation ([place]) 07/23/2014  . COPD (chronic  obstructive pulmonary disease) ([place]) 07/06/2014  .  Hypothyroidism 07/06/2014  . Numbness-Left Thigh 12/14/2013  . AAA- s/p Ao-Iliac BPG March 2015 10/20/2013  . Hyperlipidemia 10/09/2013  . Hepatic cirrhosis ([place]) 12/02/2012  . Cancer of right lung [facility]) 08/06/2011    PAA,JENNIFER 08/30/2017, 12:17 PM  [facility] [address] [place], [place], [address] Phone: [phone]   Fax:  [phone]  Name: Joseph [name] MRN: [id number] Date of Birth: [birth year]   Raeford Razor, PT 08/30/17 12:18 PM Phone: [phone] Fax: [phone]

## 2017-08-30 NOTE — Patient Instructions (Signed)
Heel Slide    Bend left knee and pull heel toward buttocks. Repeat __5__ times, use a sheet to assist.  Hold 20-30 sec . Do ___2-3_ sessions per day.  http://[email or web address].exer.us/300   Copyright  VHI. All rights reserHamstring: Towel Stretch (Supine)    Lie on back. Straighten knee and pull foot toward body. Hold __30_ seconds. Relax. Repeat _3__ times. Do _2-3__ times a day. Repeat with other leg.    Copyright  VHI. All rights reserved.    OK for heat , follow with ice if needed.

## 2017-09-01 ENCOUNTER — Ambulatory Visit: Payer: PPO | Attending: Orthopaedic Surgery | Admitting: Physical Therapy

## 2017-09-01 ENCOUNTER — Encounter: Payer: Self-pay | Admitting: Physical Therapy

## 2017-09-01 DIAGNOSIS — R262 Difficulty in walking, not elsewhere classified: Secondary | ICD-10-CM | POA: Diagnosis not present

## 2017-09-01 DIAGNOSIS — R293 Abnormal posture: Secondary | ICD-10-CM | POA: Diagnosis not present

## 2017-09-01 DIAGNOSIS — M545 Low back pain: Secondary | ICD-10-CM | POA: Insufficient documentation

## 2017-09-01 DIAGNOSIS — G8929 Other chronic pain: Secondary | ICD-10-CM | POA: Insufficient documentation

## 2017-09-01 DIAGNOSIS — R600 Localized edema: Secondary | ICD-10-CM | POA: Diagnosis not present

## 2017-09-01 DIAGNOSIS — M25562 Pain in left knee: Secondary | ICD-10-CM

## 2017-09-01 NOTE — Therapy (Signed)
[place], [place], [address] Phone: [phone]   Fax:  [phone]  Physical Therapy Treatment  Patient Details  Name: Joseph [name] MRN: [id number] Date of Birth: July 24, 1941 Referring Provider: Dr. [name]   Encounter Date: 09/01/2017  PT End of Session - 09/01/17 1418    Visit Number  2    Number of Visits  12    Date for PT Re-Evaluation  10/11/17    PT Start Time  1416    PT Stop Time  1510    PT Time Calculation (min)  54 min    Activity Tolerance  Patient tolerated treatment well    Behavior During Therapy  [facility] for tasks assessed/performed       Past Medical History:  Diagnosis Date  . Acute on chronic diastolic CHF (congestive heart failure), NYHA class 3 ([place]) 07/23/2014  . Arthritis    left knee and back arthrtis  . Atrial fibrillation with rapid ventricular response ([place]) 07/04/2014  . CAD (coronary artery disease)   . Cataract   . Cirrhosis ([place])    By radiography, seen on CT  . Diverticulosis   . Emphysema   . Enlarged prostate   . Headache    when younger but not any longer  . Heart murmur    teen  . History of abdominal aortic aneurysm (AAA)   . History of colon polyps 2006,2008  . History of transfusion 2015  . Hx of radiation therapy 09/02/11 to 10/19/11   chest  . Hx of radiation therapy   . Hyperlipidemia    takes Pravastatin  . Hypothyroidism    takes Synthroid daily  . Inguinal hernia   . Lung cancer ([place])    right upper lobe  . Personal history of adenomatous colonic polyps 07/04/2012   2006, adenomatous right colon polyp removed, then ablated in 2007 followup, 2008, adenomatous polyp of the right colon removed with hot biopsy forceps. All by Dr. Lajoyce Corners 01/03/2013 diminutive polyp ascending colon - adenoma    . Rosacea   . Shortness of breath    with exertion  . Small bowel obstruction ([place]) 07/05/2014  . Status post chemotherapy    last chemo 2013  .  Umbilical hernia, incarcerated- surgery 07/10/14 07/05/2014    Past Surgical History:  Procedure Laterality Date  . ABDOMINAL AORTAGRAM  10/20/2013   Procedure: ABDOMINAL Maxcine Ham;  Surgeon: Elam Dutch, MD;  Location: [facility] CATH LAB;  Service: Cardiovascular;;  . ABDOMINAL AORTIC ANEURYSM REPAIR N/A 10/31/2013   Procedure: aorto to left external iliac and right external iliac and left internal iliac, reimplantation of inferior mesenteric artery and ligation of left iliac artery aneursym;  Surgeon: Elam Dutch, MD;  Location: [place];  Service: Vascular;  Laterality: N/A;  . ABDOMINAL WOUND DEHISCENCE N/A 11/10/2013   Procedure: ABDOMINAL WOUND CLOSURE ;  Surgeon: Serafina Mitchell, MD;  Location: [place];  Service: Vascular;  Laterality: N/A;  . bletharoplasty    . BOWEL RESECTION N/A 07/10/2014   Procedure: SMALL BOWEL RESECTION;  Surgeon: [name], MD;  Location: [place];  Service: General;  Laterality: N/A;  . CARDIAC CATHETERIZATION  2003  . CATARACT EXTRACTION, BILATERAL    . COLONOSCOPY W/ BIOPSIES     multiple   . ESOPHAGOGASTRODUODENOSCOPY  2006  . FINGER SURGERY  2008   left pointer finger  . HERNIA REPAIR  70's   x3  . INCISIONAL HERNIA REPAIR N/A 07/10/2014  Procedure: HERNIA REPAIR INCISIONAL;  Surgeon: [name], MD;  Location: [place];  Service: General;  Laterality: N/A;  . KNEE ARTHROSCOPY  2008   left knee  . LAPAROTOMY N/A 07/10/2014   Procedure: EXPLORATORY LAPAROTOMY;  Surgeon: [name], MD;  Location: [place];  Service: General;  Laterality: N/A;  . LOWER EXTREMITY ANGIOGRAM Bilateral 10/20/2013   Procedure: LOWER EXTREMITY ANGIOGRAM;  Surgeon: Elam Dutch, MD;  Location: [facility] CATH LAB;  Service: Cardiovascular;  Laterality: Bilateral;  . LYSIS OF ADHESION N/A 07/10/2014   Procedure: LYSIS OF ADHESION - 90 minutes;  Surgeon: [name], MD;  Location: [place];  Service: General;  Laterality: N/A;  . PORTACATH PLACEMENT  08/10/2011   Procedure:  INSERTION PORT-A-CATH;  Surgeon: Pierre Bali, MD;  Location: [place];  Service: Thoracic;  Laterality: Left;  Left Subclavian  . PTOSIS REPAIR Bilateral 12-02-2015  . THROMBECTOMY ILIAC ARTERY Bilateral 10/31/2013   Procedure: THROMBECTOMY ILIAC ARTERY;  Surgeon: Elam Dutch, MD;  Location: [place];  Service: Vascular;  Laterality: Bilateral;  . TONSILLECTOMY     as a child  . TOTAL KNEE ARTHROPLASTY Left 08/03/2017   Procedure: LEFT TOTAL KNEE ARTHROPLASTY;  Surgeon: [name], MD;  Location: [place];  Service: Orthopedics;  Laterality: Left;  Marland Kitchen VASECTOMY  70's  . VENTRAL HERNIA REPAIR N/A 04/04/2015   Procedure: OPEN REPAIR OF RECURRENT ABDOMINAL WALL HERNIA WITH MESH, COMPLEX LYSIS OF ADHESIONS X1 HR TAR COMPONENT SEPARATION;  Surgeon: Greer Pickerel, MD;  Location: [facility];  Service: General;  Laterality: N/A;    There were no vitals filed for this visit.  Subjective Assessment - 09/01/17 1419    Subjective  " I am still having a good deal of pain that the medication just isn't touching the pain, i've still been doing my exercsies"    Currently in Pain?  Yes    Pain Score  7     Pain Orientation  Left                      [facility] Adult PT Treatment/Exercise - 09/01/17 1422      Knee/Hip Exercises: Stretches   Active Hamstring Stretch  2 reps;30 seconds      Knee/Hip Exercises: Aerobic   Stationary Bike  L3 x 5 min      Knee/Hip Exercises: Machines for Strengthening   Cybex Leg Press  1 x 10 40# bil LE, 1 x 10 20# pushing up with both and lowering with LLE only.       Knee/Hip Exercises: Standing   Hip Abduction  Both;2 sets;15 reps;Knee straight    Step Down  2 sets;Step Height: 4";Hand Hold: 1;15 reps    Step Down Limitations  visual cues for proper form and avoid using RLE     Wall Squat  2 sets;10 reps    Wall Squat Limitations  verbal cues required to decrease R trunk lean       Modalities   Modalities  Vasopneumatic      Vasopneumatic   Number  Minutes Vasopneumatic   10 minutes    Vasopnuematic Location   Knee    Vasopneumatic Pressure  Medium    Vasopneumatic Temperature   34      Manual Therapy   Manual Therapy  Joint mobilization;Soft tissue mobilization    Joint Mobilization  grade 3-4 PA mob in sitting with foot everted to promote extension/ screw home mechanism  Soft tissue mobilization  IASTM along L hamstrings in prone hang position               PT Education - 2017/09/05 1502    Education provided  Yes    Education Details  reviewed previous HEP          PT Long Term Goals - 08/30/17 1204      PT LONG TERM GOAL #1   Title  Pt will improve FOTO score to <50% impaired to demo improvement in functional mobility.     Time  6    Period  Weeks    Status  New    Target Date  10/11/17      PT LONG TERM GOAL #2   Title  He will be independent with all HEP issued    Time  6    Period  Weeks    Status  New    Target Date  10/11/17      PT LONG TERM GOAL #3   Title  Pt will be able to walk as needed in the community without limitation of pain.     Time  6    Period  Weeks    Status  New    Target Date  10/11/17      PT LONG TERM GOAL #4   Title  He will report gradual return to gym to increase fitness and lose weight    Time  6    Period  Weeks    Status  New    Target Date  10/11/17      PT LONG TERM GOAL #5   Title  Pt will demo Lt knee AROM in extension to lacking no more than 5 deg for proper gait mechanics     Time  6    Period  Weeks    Status  New    Target Date  10/11/17            Plan - 09-05-2017 1502    Clinical Impression Statement  Mr. Goodall reports intermittent pain at 7/10 today. continued focus on flexibility with mobs to promote extension,  and standing hip/ knee strengthening which he required visual/ verbal cues for proper form.  He fatigues quickly with standing exercise but completed all exercises today. utilized game ready post session for pain and swelling.       PT Treatment/Interventions  ADLs/Self Care Home Management;Patient/family education;Passive range of motion;Therapeutic activities;Moist Heat;Cryotherapy;Electrical Stimulation;Gait training;Neuromuscular re-education;Manual techniques;Taping;Manual lymph drainage;Vasopneumatic Device;Therapeutic exercise;Balance training    PT Next Visit Plan  Progress ROM and strength, bike, standing ex, Game ready PRN    Consulted and Agree with Plan of Care  Patient       Patient will benefit from skilled therapeutic intervention in order to improve the following deficits and impairments:  Decreased range of motion, Difficulty walking, Increased fascial restricitons, Pain, Impaired flexibility, Hypomobility, Decreased balance, Decreased mobility, Decreased strength, Increased edema  Visit Diagnosis: Localized edema  Acute pain of left knee  Difficulty in walking, not elsewhere classified   G-Codes - 09/05/2017 0757    Functional Assessment Tool Used (Outpatient Only)  FOTO    Functional Limitation  Mobility: Walking and moving around    Mobility: Walking and Moving Around Current Status (Y4034)  At least 60 percent but less than 80 percent impaired, limited or restricted    Mobility: Walking and Moving Around Goal Status (V4259)  At least 40 percent but less than 60 percent impaired,  limited or restricted       Problem List Patient Active Problem List   Diagnosis Date Noted  . Primary osteoarthritis of left knee 08/03/2017  . S/P total knee replacement using cement, left 08/03/2017  . SBO (small bowel obstruction) ([place]) 03/18/2017  . Elevated d-dimer   . Hypoxia   . SOB (shortness of breath)   . Small bowel obstruction ([place]) 10/20/2016  . AKI (acute kidney injury) ([place]) 10/20/2016  . Acute respiratory failure with hypoxia ([place]) 10/20/2016  . Chronic pain of left knee 07/06/2016  . Port catheter in place 02/27/2016  . Claudication ([place]) 02/18/2016  . Statin intolerance 02/18/2016  .  Essential hypertension 02/18/2016  . Coronary artery disease due to lipid rich plaque 08/29/2015  . Acute on chronic diastolic CHF (congestive heart failure), NYHA class 2 ([place]) 08/29/2015  . 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 Hypoxemia requiring supplemental oxygen   . Atrial fibrillation ([place]) 07/23/2014  . COPD (chronic obstructive pulmonary disease) ([place]) 07/06/2014  . Hypothyroidism 07/06/2014  . Numbness-Left Thigh 12/14/2013  . AAA- s/p Ao-Iliac BPG March 2015 10/20/2013  . Hyperlipidemia 10/09/2013  . Hepatic cirrhosis ([place]) 12/02/2012  . Cancer of right lung [facility]) 08/06/2011    Starr [place] PT, DPT, LAT, ATC  09/01/17  3:06 PM      [facility] [address] [place], [place], [address] Phone: [phone]   Fax:  [phone]  Name: Joseph [name] MRN: [id number] Date of Birth: May 23, 1941

## 2017-09-03 ENCOUNTER — Ambulatory Visit (INDEPENDENT_AMBULATORY_CARE_PROVIDER_SITE_OTHER): Payer: PPO | Admitting: Orthopaedic Surgery

## 2017-09-03 ENCOUNTER — Encounter (INDEPENDENT_AMBULATORY_CARE_PROVIDER_SITE_OTHER): Payer: Self-pay | Admitting: Orthopaedic Surgery

## 2017-09-03 VITALS — Resp 16 | Ht 71.0 in | Wt 200.0 lb

## 2017-09-03 DIAGNOSIS — Z96652 Presence of left artificial knee joint: Secondary | ICD-10-CM

## 2017-09-03 NOTE — Progress Notes (Signed)
Office Visit Note   Patient: Joseph [name]           Date of Birth: 1941-05-07           MRN: [id number] Visit Date: 09/03/2017              Requested by: Joseph Redwood, MD [address] [place], [place] [address] PCP: Joseph Redwood, MD   Assessment & Plan: Visit Diagnoses:  1. History of left knee replacement     Plan: One month status post primary left total knee replacement doing well. No ambulatory aid. Continues to go to physical therapy. Has 110 of flexion and lacks a few degrees to full extension. He needs to work on his strengthening exercises. Also having trouble sleeping at night and should use a combination of Aleve, Robaxin and minimal use of the pain meds we'll plan to see him back in a month. Encourage strengthening exercises as I  think that's the biggest problem  I.e.quad and hamstring weakness  Follow-Up Instructions: Return in about 1 month (around 10/04/2017).   Orders:  No orders of the defined types were placed in this encounter.  No orders of the defined types were placed in this encounter.     Procedures: No procedures performed   Clinical Data: No additional findings.   Subjective: Chief Complaint  Patient presents with  . Left Knee - Routine Post Op    Joseph [name] is a 71 y o S/P Left TKA on 08/03/17. He relates he is having a lot of pain.  1 month status post primary left total knee replacement doing well. Doesn't use any ambulatory aid. Continues to go to physical therapy. Biggest issue is sleeping at night because of leg pain. I'm sure this is related to the weakness. No history of shortness of breath chest pain or fever or chills  HPI  Review of Systems  Constitutional: Negative for fatigue.  HENT: Negative for hearing loss.   Respiratory: Negative for apnea, chest tightness and shortness of breath.   Cardiovascular: Negative for chest pain, palpitations and leg swelling.  Gastrointestinal: Negative for blood in stool,  constipation and diarrhea.  Genitourinary: Negative for difficulty urinating.  Musculoskeletal: Negative for arthralgias, back pain, joint swelling, myalgias, neck pain and neck stiffness.  Neurological: Positive for weakness. Negative for numbness and headaches.  Hematological: Does not bruise/bleed easily.  Psychiatric/Behavioral: Negative for sleep disturbance. The patient is not nervous/anxious.      Objective: Vital Signs: Resp 16   Ht 5\' 11"  (1.803 m)   Wt 200 lb (90.7 kg)   BMI 27.89 kg/m   Physical Exam  Ortho Exam left knee with minimal swelling. Minimal edema. The knee was not hot warm or red. No instability with a varus or valgus stress. About 110 of flexion. No popliteal pain pain. Lacks about 7 to full knee extension. No localized areas of tenderness.  Specialty Comments:  No specialty comments available.  Imaging: No results found.   PMFS History: Patient Active Problem List   Diagnosis Date Noted  . Primary osteoarthritis of left knee 08/03/2017  . S/P total knee replacement using cement, left 08/03/2017  . SBO (small bowel obstruction) ([place]) 03/18/2017  . Elevated d-dimer   . Hypoxia   . SOB (shortness of breath)   . Small bowel obstruction ([place]) 10/20/2016  . AKI (acute kidney injury) ([place]) 10/20/2016  . Acute respiratory failure with hypoxia ([place]) 10/20/2016  . Chronic pain of left knee 07/06/2016  . Port catheter  in place 02/27/2016  . Claudication ([place]) 02/18/2016  . Statin intolerance 02/18/2016  . Essential hypertension 02/18/2016  . Coronary artery disease due to lipid rich plaque 08/29/2015  . Acute on chronic diastolic CHF (congestive heart failure), NYHA class 2 ([place]) 08/29/2015  . 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 Hypoxemia requiring supplemental oxygen   . Atrial fibrillation ([place]) 07/23/2014  . COPD (chronic obstructive pulmonary disease) ([place]) 07/06/2014  . Hypothyroidism  07/06/2014  . Numbness-Left Thigh 12/14/2013  . AAA- s/p Ao-Iliac BPG March 2015 10/20/2013  . Hyperlipidemia 10/09/2013  . Hepatic cirrhosis ([place]) 12/02/2012  . Cancer of right lung ([place]) 08/06/2011   Past Medical History:  Diagnosis Date  . Acute on chronic diastolic CHF (congestive heart failure), NYHA class 3 ([place]) 07/23/2014  . Arthritis    left knee and back arthrtis  . Atrial fibrillation with rapid ventricular response ([place]) 07/04/2014  . CAD (coronary artery disease)   . Cataract   . Cirrhosis ([place])    By radiography, seen on CT  . Diverticulosis   . Emphysema   . Enlarged prostate   . Headache    when younger but not any longer  . Heart murmur    teen  . History of abdominal aortic aneurysm (AAA)   . History of colon polyps 2006,2008  . History of transfusion 2015  . Hx of radiation therapy 09/02/11 to 10/19/11   chest  . Hx of radiation therapy   . Hyperlipidemia    takes Pravastatin  . Hypothyroidism    takes Synthroid daily  . Inguinal hernia   . Lung cancer ([place])    right upper lobe  . Personal history of adenomatous colonic polyps 07/04/2012   2006, adenomatous right colon polyp removed, then ablated in 2007 followup, 2008, adenomatous polyp of the right colon removed with hot biopsy forceps. All by Dr. Lajoyce Corners 01/03/2013 diminutive polyp ascending colon - adenoma    . Rosacea   . Shortness of breath    with exertion  . Small bowel obstruction ([place]) 07/05/2014  . Status post chemotherapy    last chemo 2013  . Umbilical hernia, incarcerated- surgery 07/10/14 07/05/2014    Family History  Problem Relation Age of Onset  . Cancer Father   . Diabetes Father   . Hyperlipidemia Mother   . Hypertension Mother   . Hyperlipidemia Brother   . AAA (abdominal aortic aneurysm) Brother   . Hypertension Brother   . Hyperlipidemia Sister   . Hypertension Sister   . Anesthesia problems Neg Hx   . Hypotension Neg Hx   . Malignant hyperthermia Neg Hx   . Pseudochol  deficiency Neg Hx   . Colon cancer Neg Hx     Past Surgical History:  Procedure Laterality Date  . ABDOMINAL AORTAGRAM  10/20/2013   Procedure: ABDOMINAL Maxcine Ham;  Surgeon: Elam Dutch, MD;  Location: [facility] CATH LAB;  Service: Cardiovascular;;  . ABDOMINAL AORTIC ANEURYSM REPAIR N/A 10/31/2013   Procedure: aorto to left external iliac and right external iliac and left internal iliac, reimplantation of inferior mesenteric artery and ligation of left iliac artery aneursym;  Surgeon: Elam Dutch, MD;  Location: [place];  Service: Vascular;  Laterality: N/A;  . ABDOMINAL WOUND DEHISCENCE N/A 11/10/2013   Procedure: ABDOMINAL WOUND CLOSURE ;  Surgeon: Serafina Mitchell, MD;  Location: [place];  Service: Vascular;  Laterality: N/A;  . bletharoplasty    . BOWEL RESECTION N/A 07/10/2014  Procedure: SMALL BOWEL RESECTION;  Surgeon: [name], MD;  Location: [place];  Service: General;  Laterality: N/A;  . CARDIAC CATHETERIZATION  2003  . CATARACT EXTRACTION, BILATERAL    . COLONOSCOPY W/ BIOPSIES     multiple   . ESOPHAGOGASTRODUODENOSCOPY  2006  . FINGER SURGERY  2008   left pointer finger  . HERNIA REPAIR  70's   x3  . INCISIONAL HERNIA REPAIR N/A 07/10/2014   Procedure: HERNIA REPAIR INCISIONAL;  Surgeon: [name], MD;  Location: [place];  Service: General;  Laterality: N/A;  . KNEE ARTHROSCOPY  2008   left knee  . LAPAROTOMY N/A 07/10/2014   Procedure: EXPLORATORY LAPAROTOMY;  Surgeon: [name], MD;  Location: [place];  Service: General;  Laterality: N/A;  . LOWER EXTREMITY ANGIOGRAM Bilateral 10/20/2013   Procedure: LOWER EXTREMITY ANGIOGRAM;  Surgeon: Elam Dutch, MD;  Location: [facility] CATH LAB;  Service: Cardiovascular;  Laterality: Bilateral;  . LYSIS OF ADHESION N/A 07/10/2014   Procedure: LYSIS OF ADHESION - 90 minutes;  Surgeon: [name], MD;  Location: [place];  Service: General;  Laterality: N/A;  . PORTACATH PLACEMENT  08/10/2011   Procedure: INSERTION PORT-A-CATH;   Surgeon: Pierre Bali, MD;  Location: [place];  Service: Thoracic;  Laterality: Left;  Left Subclavian  . PTOSIS REPAIR Bilateral 12-02-2015  . THROMBECTOMY ILIAC ARTERY Bilateral 10/31/2013   Procedure: THROMBECTOMY ILIAC ARTERY;  Surgeon: Elam Dutch, MD;  Location: [place];  Service: Vascular;  Laterality: Bilateral;  . TONSILLECTOMY     as a child  . TOTAL KNEE ARTHROPLASTY Left 08/03/2017   Procedure: LEFT TOTAL KNEE ARTHROPLASTY;  Surgeon: [name], MD;  Location: [place];  Service: Orthopedics;  Laterality: Left;  Marland Kitchen VASECTOMY  70's  . VENTRAL HERNIA REPAIR N/A 04/04/2015   Procedure: OPEN REPAIR OF RECURRENT ABDOMINAL WALL HERNIA WITH MESH, COMPLEX LYSIS OF ADHESIONS X1 HR TAR COMPONENT SEPARATION;  Surgeon: Greer Pickerel, MD;  [facility];  Service: General;  Laterality: N/A;   Social History   Occupational History  . Not on file  Tobacco Use  . Smoking status: Former Smoker    Packs/day: 1.50    Years: 40.00    Pack years: 60.00    Types: Cigarettes    Last attempt to quit: 07/02/2011    Years since quitting: 6.1  . Smokeless tobacco: Former Network engineer and Sexual Activity  . Alcohol use: Yes    Comment: social - 2 MONTHLY  . Drug use: No  . Sexual activity: No

## 2017-09-07 ENCOUNTER — Encounter: Payer: Self-pay | Admitting: Physical Therapy

## 2017-09-07 ENCOUNTER — Ambulatory Visit: Payer: PPO | Admitting: Physical Therapy

## 2017-09-07 DIAGNOSIS — R600 Localized edema: Secondary | ICD-10-CM

## 2017-09-07 DIAGNOSIS — M25562 Pain in left knee: Secondary | ICD-10-CM

## 2017-09-07 DIAGNOSIS — R262 Difficulty in walking, not elsewhere classified: Secondary | ICD-10-CM

## 2017-09-07 NOTE — Therapy (Signed)
[place], [place], [address] Phone: [phone]   Fax:  [phone]  Physical Therapy Treatment  Patient Details  Name: Joseph [name] MRN: [id number] Date of Birth: 11-25-1940 Referring Provider: Dr. [name]   Encounter Date: 09/07/2017  PT End of Session - 09/07/17 1323    Visit Number  3    Number of Visits  12    Date for PT Re-Evaluation  10/11/17    PT Start Time  1021    PT Stop Time  1120    PT Time Calculation (min)  59 min    Activity Tolerance  Patient tolerated treatment well    Behavior During Therapy  [facility] for tasks assessed/performed       Past Medical History:  Diagnosis Date  . Acute on chronic diastolic CHF (congestive heart failure), NYHA class 3 ([place]) 07/23/2014  . Arthritis    left knee and back arthrtis  . Atrial fibrillation with rapid ventricular response ([place]) 07/04/2014  . CAD (coronary artery disease)   . Cataract   . Cirrhosis ([place])    By radiography, seen on CT  . Diverticulosis   . Emphysema   . Enlarged prostate   . Headache    when younger but not any longer  . Heart murmur    teen  . History of abdominal aortic aneurysm (AAA)   . History of colon polyps 2006,2008  . History of transfusion 2015  . Hx of radiation therapy 09/02/11 to 10/19/11   chest  . Hx of radiation therapy   . Hyperlipidemia    takes Pravastatin  . Hypothyroidism    takes Synthroid daily  . Inguinal hernia   . Lung cancer ([place])    right upper lobe  . Personal history of adenomatous colonic polyps 07/04/2012   2006, adenomatous right colon polyp removed, then ablated in 2007 followup, 2008, adenomatous polyp of the right colon removed with hot biopsy forceps. All by Dr. Lajoyce Corners 01/03/2013 diminutive polyp ascending colon - adenoma    . Rosacea   . Shortness of breath    with exertion  . Small bowel obstruction ([place]) 07/05/2014  . Status post chemotherapy    last chemo 2013  .  Umbilical hernia, incarcerated- surgery 07/10/14 07/05/2014    Past Surgical History:  Procedure Laterality Date  . ABDOMINAL AORTAGRAM  10/20/2013   Procedure: ABDOMINAL Maxcine Ham;  Surgeon: Elam Dutch, MD;  Location: [facility] CATH LAB;  Service: Cardiovascular;;  . ABDOMINAL AORTIC ANEURYSM REPAIR N/A 10/31/2013   Procedure: aorto to left external iliac and right external iliac and left internal iliac, reimplantation of inferior mesenteric artery and ligation of left iliac artery aneursym;  Surgeon: Elam Dutch, MD;  Location: [place];  Service: Vascular;  Laterality: N/A;  . ABDOMINAL WOUND DEHISCENCE N/A 11/10/2013   Procedure: ABDOMINAL WOUND CLOSURE ;  Surgeon: Serafina Mitchell, MD;  Location: [place];  Service: Vascular;  Laterality: N/A;  . bletharoplasty    . BOWEL RESECTION N/A 07/10/2014   Procedure: SMALL BOWEL RESECTION;  Surgeon: [name], MD;  Location: [place];  Service: General;  Laterality: N/A;  . CARDIAC CATHETERIZATION  2003  . CATARACT EXTRACTION, BILATERAL    . COLONOSCOPY W/ BIOPSIES     multiple   . ESOPHAGOGASTRODUODENOSCOPY  2006  . FINGER SURGERY  2008   left pointer finger  . HERNIA REPAIR  70's   x3  . INCISIONAL HERNIA REPAIR N/A 07/10/2014  Procedure: HERNIA REPAIR INCISIONAL;  Surgeon: [name], MD;  Location: [place];  Service: General;  Laterality: N/A;  . KNEE ARTHROSCOPY  2008   left knee  . LAPAROTOMY N/A 07/10/2014   Procedure: EXPLORATORY LAPAROTOMY;  Surgeon: [name], MD;  Location: [place];  Service: General;  Laterality: N/A;  . LOWER EXTREMITY ANGIOGRAM Bilateral 10/20/2013   Procedure: LOWER EXTREMITY ANGIOGRAM;  Surgeon: Elam Dutch, MD;  Location: [facility] C[facility] LAB;  Service: Cardiovascular;  Laterality: Bilateral;  . LYSIS OF ADHESION N/A 07/10/2014   Procedure: LYSIS OF ADHESION - 90 minutes;  Surgeon: [name], MD;  Location: [place];  Service: General;  Laterality: N/A;  . PORTACATH PLACEMENT  08/10/2011   Procedure:  INSERTION PORT-A-CATH;  Surgeon: Pierre Bali, MD;  Location: [place];  Service: Thoracic;  Laterality: Left;  Left Subclavian  . PTOSIS REPAIR Bilateral 12-02-2015  . THROMBECTOMY ILIAC ARTERY Bilateral 10/31/2013   Procedure: THROMBECTOMY ILIAC ARTERY;  Surgeon: Elam Dutch, MD;  Location: [place];  Service: Vascular;  Laterality: Bilateral;  . TONSILLECTOMY     as a child  . TOTAL KNEE ARTHROPLASTY Left 08/03/2017   Procedure: LEFT TOTAL KNEE ARTHROPLASTY;  Surgeon: [name], MD;  Location: [place];  Service: Orthopedics;  Laterality: Left;  Marland Kitchen VASECTOMY  70's  . VENTRAL HERNIA REPAIR N/A 04/04/2015   Procedure: OPEN REPAIR OF RECURRENT ABDOMINAL WALL HERNIA WITH MESH, COMPLEX LYSIS OF ADHESIONS X1 HR TAR COMPONENT SEPARATION;  Surgeon: Greer Pickerel, MD;  Location[facility];  Service: General;  Laterality: N/A;    There were no vitals filed for this visit.  Subjective Assessment - 09/07/17 1314    Subjective  I want to work on the swelling.  My knee is bending good.  I have been working on the straightening too,  but it hurts.      Currently in Pain?  Yes    Pain Score  5     Pain Location  Knee    Pain Orientation  Left    Pain Descriptors / Indicators  Aching    Pain Radiating Towards  shin    Pain Frequency  Intermittent    Aggravating Factors   nighttime,  stretching,  standing too long    Pain Relieving Factors  ice,  propping    Effect of Pain on Daily Activities  limits sleep,  Not back to all ADL's                      [facility] Adult PT Treatment/Exercise - 09/07/17 0001      Self-Care   Self-Care  Other Self-Care Comments    Other Self-Care Comments   sleeping hints,  ways to decrease pain at night,        Knee/Hip Exercises: Stretches   Passive Hamstring Stretch  3 reps;30 seconds    Quad Stretch  3 reps;30 seconds off edge of mat AAROM with manual strumming rectus    Hip Flexor Stretch  3 reps;30 seconds concurrent with manual    Other Knee/Hip  Stretches  standing step stretch 10 x 10 seconds    Other Knee/Hip Stretches  knee extension stretch  practiced small bouts qo supported leg quad sets with retrograds and mild stretch with brief rests        Knee/Hip Exercises: Aerobic   Stationary Bike  L2 X 5 minutes,  1 mile      Knee/Hip Exercises: Standing  Terminal Knee Extension  1 set;10 reps at wall pressing back of knee into ball at wall,  soreness     Other Standing Knee Exercises  wall slides facing wall 10 x each leg      Knee/Hip Exercises: Seated   Other Seated Knee/Hip Exercises  knee flexion for ROM,  improved post manual      Knee/Hip Exercises: Supine   Quad Sets  1 set;10 reps small towel roll    Heel Slides  10 reps AA      Vasopneumatic   Number Minutes Vasopneumatic   10 minutes    Vasopnuematic Location   Knee    Vasopneumatic Pressure  Medium    Vasopneumatic Temperature   34      Manual Therapy   Manual therapy comments  retrograde,      Joint Mobilization  Tibia P/a glides with gentle stretches              PT Education - 09/07/17 1322    Education provided  Yes    Education Details  self care,  how to stretch    Person(s) Educated  Patient    Methods  Demonstration;Tactile cues    Comprehension  Verbalized understanding       PT Short Term Goals - 09/07/17 1325      PT SHORT TERM GOAL #1   Title  He will be independent with initial HEp    Baseline  so far independent    Time  3    Period  Weeks    Status  On-going      PT SHORT TERM GOAL #2   Title  pain with activity decreased >/= 25%    Baseline  continues to be painful    Period  Weeks    Status  On-going        PT Long Term Goals - 08/30/17 1204      PT LONG TERM GOAL #1   Title  Pt will improve FOTO score to <50% impaired to demo improvement in functional mobility.     Time  6    Period  Weeks    Status  New    Target Date  10/11/17      PT LONG TERM GOAL #2   Title  He will be independent with all HEP issued     Time  6    Period  Weeks    Status  New    Target Date  10/11/17      PT LONG TERM GOAL #3   Title  Pt will be able to walk as needed in the community without limitation of pain.     Time  6    Period  Weeks    Status  New    Target Date  10/11/17      PT LONG TERM GOAL #4   Title  He will report gradual return to gym to increase fitness and lose weight    Time  6    Period  Weeks    Status  New    Target Date  10/11/17      PT LONG TERM GOAL #5   Title  Pt will demo Lt knee AROM in extension to lacking no more than 5 deg for proper gait mechanics     Time  6    Period  Weeks    Status  New    Target Date  10/11/17  Plan - 09/07/17 1324    Clinical Impression Statement  Pain 5/10  distal quads post exercise/ manual, prior to cold.  Patient has many questions answered today on how to help pain.  Tissue softened  and ROM increased post session,  Not formally measured.  patient seemed pleased with his progress.  Progress toward ROM goals.    PT Next Visit Plan  Progress ROM and strength, bike, standing ex, Game ready PRN    PT Home Exercise Plan  issued HEP for knee flexibility     Consulted and Agree with Plan of Care  Patient       Patient will benefit from skilled therapeutic intervention in order to improve the following deficits and impairments:     Visit Diagnosis: Localized edema  Acute pain of left knee  Difficulty in walking, not elsewhere classified     Problem List Patient Active Problem List   Diagnosis Date Noted  . Primary osteoarthritis of left knee 08/03/2017  . S/P total knee replacement using cement, left 08/03/2017  . SBO (small bowel obstruction) ([place]) 03/18/2017  . Elevated d-dimer   . Hypoxia   . SOB (shortness of breath)   . Small bowel obstruction ([place]) 10/20/2016  . AKI (acute kidney injury) ([place]) 10/20/2016  . Acute respiratory failure with hypoxia ([place]) 10/20/2016  . Chronic pain of left knee 07/06/2016  . Port  catheter in place 02/27/2016  . Claudication ([place]) 02/18/2016  . Statin intolerance 02/18/2016  . Essential hypertension 02/18/2016  . Coronary artery disease due to lipid rich plaque 08/29/2015  . Acute on chronic diastolic CHF (congestive heart failure), NYHA class 2 ([place]) 08/29/2015  . 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 Hypoxemia requiring supplemental oxygen   . Atrial fibrillation ([place]) 07/23/2014  . COPD (chronic obstructive pulmonary disease) ([place]) 07/06/2014  . Hypothyroidism 07/06/2014  . Numbness-Left Thigh 12/14/2013  . AAA- s/p Ao-Iliac BPG March 2015 10/20/2013  . Hyperlipidemia 10/09/2013  . Hepatic cirrhosis ([place]) 12/02/2012  . Cancer of right lung ([place]) 08/06/2011    Malaysia Crance PTA 09/07/2017, 1:27 PM  [facility] [address] [place], [place], [address] Phone: [phone]   Fax:  [phone]  Name: HAITHAM DOLINSKY MRN: [id number] Date of Birth: 05/10/1941

## 2017-09-09 ENCOUNTER — Encounter: Payer: Self-pay | Admitting: Physical Therapy

## 2017-09-09 ENCOUNTER — Ambulatory Visit: Payer: PPO | Admitting: Physical Therapy

## 2017-09-09 DIAGNOSIS — R600 Localized edema: Secondary | ICD-10-CM | POA: Diagnosis not present

## 2017-09-09 DIAGNOSIS — M25562 Pain in left knee: Secondary | ICD-10-CM

## 2017-09-09 DIAGNOSIS — R262 Difficulty in walking, not elsewhere classified: Secondary | ICD-10-CM

## 2017-09-09 NOTE — Therapy (Signed)
[place], [place], [address] Phone: [phone]   Fax:  [phone]  Physical Therapy Treatment  Patient Details  Name: Joseph [name] MRN: [id number] Date of Birth: 01-Jan-1941 Referring Provider: Dr. [name]   Encounter Date: 09/09/2017  PT End of Session - 09/09/17 1414    Visit Number  4    Number of Visits  12    Date for PT Re-Evaluation  10/11/17    PT Start Time  9449    PT Stop Time  1510    PT Time Calculation (min)  55 min    Activity Tolerance  Patient tolerated treatment well    Behavior During Therapy  [facility] for tasks assessed/performed       Past Medical History:  Diagnosis Date  . Acute on chronic diastolic CHF (congestive heart failure), NYHA class 3 ([place]) 07/23/2014  . Arthritis    left knee and back arthrtis  . Atrial fibrillation with rapid ventricular response ([place]) 07/04/2014  . CAD (coronary artery disease)   . Cataract   . Cirrhosis ([place])    By radiography, seen on CT  . Diverticulosis   . Emphysema   . Enlarged prostate   . Headache    when younger but not any longer  . Heart murmur    teen  . History of abdominal aortic aneurysm (AAA)   . History of colon polyps 2006,2008  . History of transfusion 2015  . Hx of radiation therapy 09/02/11 to 10/19/11   chest  . Hx of radiation therapy   . Hyperlipidemia    takes Pravastatin  . Hypothyroidism    takes Synthroid daily  . Inguinal hernia   . Lung cancer ([place])    right upper lobe  . Personal history of adenomatous colonic polyps 07/04/2012   2006, adenomatous right colon polyp removed, then ablated in 2007 followup, 2008, adenomatous polyp of the right colon removed with hot biopsy forceps. All by Dr. Lajoyce Corners 01/03/2013 diminutive polyp ascending colon - adenoma    . Rosacea   . Shortness of breath    with exertion  . Small bowel obstruction ([place]) 07/05/2014  . Status post chemotherapy    last chemo 2013  .  Umbilical hernia, incarcerated- surgery 07/10/14 07/05/2014    Past Surgical History:  Procedure Laterality Date  . ABDOMINAL AORTAGRAM  10/20/2013   Procedure: ABDOMINAL Maxcine Ham;  Surgeon: Elam Dutch, MD;  Location: [facility] CATH LAB;  Service: Cardiovascular;;  . ABDOMINAL AORTIC ANEURYSM REPAIR N/A 10/31/2013   Procedure: aorto to left external iliac and right external iliac and left internal iliac, reimplantation of inferior mesenteric artery and ligation of left iliac artery aneursym;  Surgeon: Elam Dutch, MD;  Location: [place];  Service: Vascular;  Laterality: N/A;  . ABDOMINAL WOUND DEHISCENCE N/A 11/10/2013   Procedure: ABDOMINAL WOUND CLOSURE ;  Surgeon: Serafina Mitchell, MD;  Location: [place];  Service: Vascular;  Laterality: N/A;  . bletharoplasty    . BOWEL RESECTION N/A 07/10/2014   Procedure: SMALL BOWEL RESECTION;  Surgeon: [name], MD;  Location: [place];  Service: General;  Laterality: N/A;  . CARDIAC CATHETERIZATION  2003  . CATARACT EXTRACTION, BILATERAL    . COLONOSCOPY W/ BIOPSIES     multiple   . ESOPHAGOGASTRODUODENOSCOPY  2006  . FINGER SURGERY  2008   left pointer finger  . HERNIA REPAIR  70's   x3  . INCISIONAL HERNIA REPAIR N/A 07/10/2014  Procedure: HERNIA REPAIR INCISIONAL;  Surgeon: [name], MD;  Location: [place];  Service: General;  Laterality: N/A;  . KNEE ARTHROSCOPY  2008   left knee  . LAPAROTOMY N/A 07/10/2014   Procedure: EXPLORATORY LAPAROTOMY;  Surgeon: [name], MD;  Location: [place];  Service: General;  Laterality: N/A;  . LOWER EXTREMITY ANGIOGRAM Bilateral 10/20/2013   Procedure: LOWER EXTREMITY ANGIOGRAM;  Surgeon: Elam Dutch, MD;  Location: [facility] CATH LAB;  Service: Cardiovascular;  Laterality: Bilateral;  . LYSIS OF ADHESION N/A 07/10/2014   Procedure: LYSIS OF ADHESION - 90 minutes;  Surgeon: [name], MD;  Location: [place];  Service: General;  Laterality: N/A;  . PORTACATH PLACEMENT  08/10/2011   Procedure:  INSERTION PORT-A-CATH;  Surgeon: Pierre Bali, MD;  Location: [place];  Service: Thoracic;  Laterality: Left;  Left Subclavian  . PTOSIS REPAIR Bilateral 12-02-2015  . THROMBECTOMY ILIAC ARTERY Bilateral 10/31/2013   Procedure: THROMBECTOMY ILIAC ARTERY;  Surgeon: Elam Dutch, MD;  Location: [place];  Service: Vascular;  Laterality: Bilateral;  . TONSILLECTOMY     as a child  . TOTAL KNEE ARTHROPLASTY Left 08/03/2017   Procedure: LEFT TOTAL KNEE ARTHROPLASTY;  Surgeon: [name], MD;  Location: [place];  Service: Orthopedics;  Laterality: Left;  Marland Kitchen VASECTOMY  70's  . VENTRAL HERNIA REPAIR N/A 04/04/2015   Procedure: OPEN REPAIR OF RECURRENT ABDOMINAL WALL HERNIA WITH MESH, COMPLEX LYSIS OF ADHESIONS X1 HR TAR COMPONENT SEPARATION;  Surgeon: Greer Pickerel, MD;  [facility];  Service: General;  Laterality: N/A;    There were no vitals filed for this visit.  Subjective Assessment - 09/09/17 1418    Subjective  It is feeling really stiff, swelling on superior aspect. superior incision has been red but pt reports MD said it looked okay. I had to take a pain pill in the middle of the night last night. posteior knee aches    Patient Stated Goals  Pt would like to be able to play golf again, resume normal activities.          [facility] PT Assessment - 09/09/17 0001      AROM   Left Knee Extension  0                  [facility] Adult PT Treatment/Exercise - 09/09/17 0001      Exercises   Exercises  Knee/Hip      Knee/Hip Exercises: Stretches   Passive Hamstring Stretch  Other (comment) seated EOB    ITB Stretch Limitations  sidelying    Gastroc Stretch  Other (comment) edge of step    Other Knee/Hip Stretches  heel prop 3 min      Knee/Hip Exercises: Aerobic   Stationary Bike  5 min L4      Knee/Hip Exercises: Standing   Heel Raises  20 reps edge of step    Forward Step Up  Step Height: 6"    Gait Training  swing through to heel strike with full knee ext      Knee/Hip  Exercises: Supine   Quad Sets  15 reps in heel prop    Straight Leg Raises  15 reps from quad set on towel, cues to reduce lag      Knee/Hip Exercises: Sidelying   Hip ABduction  20 reps;10 reps      Vasopneumatic   Number Minutes Vasopneumatic   15 minutes    Vasopnuematic Location   Knee  Vasopneumatic Pressure  Medium    Vasopneumatic Temperature   34               PT Short Term Goals - 09/07/17 1325      PT SHORT TERM GOAL #1   Title  He will be independent with initial HEp    Baseline  so far independent    Time  3    Period  Weeks    Status  On-going      PT SHORT TERM GOAL #2   Title  pain with activity decreased >/= 25%    Baseline  continues to be painful    Period  Weeks    Status  On-going        PT Long Term Goals - 08/30/17 1204      PT LONG TERM GOAL #1   Title  Pt will improve FOTO score to <50% impaired to demo improvement in functional mobility.     Time  6    Period  Weeks    Status  New    Target Date  10/11/17      PT LONG TERM GOAL #2   Title  He will be independent with all HEP issued    Time  6    Period  Weeks    Status  New    Target Date  10/11/17      PT LONG TERM GOAL #3   Title  Pt will be able to walk as needed in the community without limitation of pain.     Time  6    Period  Weeks    Status  New    Target Date  10/11/17      PT LONG TERM GOAL #4   Title  He will report gradual return to gym to increase fitness and lose weight    Time  6    Period  Weeks    Status  New    Target Date  10/11/17      PT LONG TERM GOAL #5   Title  Pt will demo Lt knee AROM in extension to lacking no more than 5 deg for proper gait mechanics     Time  6    Period  Weeks    Status  New    Target Date  10/11/17            Plan - 09/09/17 1426    Clinical Impression Statement  Redness noted on superior 3cm of incision, 2cm 9-3oclock without notable pain or opening of incision. Will continue to be monitored. Was able to  extend knee to 0 deg with overpressure today.     PT Treatment/Interventions  ADLs/Self Care Home Management;Patient/family education;Passive range of motion;Therapeutic activities;Moist Heat;Cryotherapy;Electrical Stimulation;Gait training;Neuromuscular re-education;Manual techniques;Taping;Manual lymph drainage;Vasopneumatic Device;Therapeutic exercise;Balance training    PT Next Visit Plan  review gait pattern- heel strike with knee straight, quad strengthening, hip extensor strengthening, monitor superior aspect of incision    PT Home Exercise Plan  issued HEP for knee flexibility, seated HSS, sidelying ITB stretch, gastroc stretch on stairs, heel raises on step    Consulted and Agree with Plan of Care  Patient       Patient will benefit from skilled therapeutic intervention in order to improve the following deficits and impairments:  Decreased range of motion, Difficulty walking, Increased fascial restricitons, Pain, Impaired flexibility, Hypomobility, Decreased balance, Decreased mobility, Decreased strength, Increased edema  Visit Diagnosis: Localized edema  Acute pain of  left knee  Difficulty in walking, not elsewhere classified     Problem List Patient Active Problem List   Diagnosis Date Noted  . Primary osteoarthritis of left knee 08/03/2017  . S/P total knee replacement using cement, left 08/03/2017  . SBO (small bowel obstruction) ([place]) 03/18/2017  . Elevated d-dimer   . Hypoxia   . SOB (shortness of breath)   . Small bowel obstruction ([place]) 10/20/2016  . AKI (acute kidney injury) ([place]) 10/20/2016  . Acute respiratory failure with hypoxia ([place]) 10/20/2016  . Chronic pain of left knee 07/06/2016  . Port catheter in place 02/27/2016  . Claudication ([place]) 02/18/2016  . Statin intolerance 02/18/2016  . Essential hypertension 02/18/2016  . Coronary artery disease due to lipid rich plaque 08/29/2015  . Acute on chronic diastolic CHF (congestive heart failure), NYHA class  2 ([place]) 08/29/2015  . 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 Hypoxemia requiring supplemental oxygen   . Atrial fibrillation ([place]) 07/23/2014  . COPD (chronic obstructive pulmonary disease) ([place]) 07/06/2014  . Hypothyroidism 07/06/2014  . Numbness-Left Thigh 12/14/2013  . AAA- s/p Ao-Iliac BPG March 2015 10/20/2013  . Hyperlipidemia 10/09/2013  . Hepatic cirrhosis ([place]) 12/02/2012  . Cancer of right lung ([place]) 08/06/2011    Ermelinda Eckert C. Maxen Rowland PT, DPT 09/09/17 3:05 PM   [facility] [address] [place], [place], [address] Phone: [phone]   Fax:  [phone]  Name: KELYN KOSKELA MRN: [id number] Date of Birth: [birth year]

## 2017-09-14 ENCOUNTER — Encounter: Payer: Self-pay | Admitting: Physical Therapy

## 2017-09-14 ENCOUNTER — Ambulatory Visit: Payer: PPO | Admitting: Physical Therapy

## 2017-09-14 DIAGNOSIS — M25562 Pain in left knee: Secondary | ICD-10-CM

## 2017-09-14 DIAGNOSIS — R600 Localized edema: Secondary | ICD-10-CM

## 2017-09-14 DIAGNOSIS — R262 Difficulty in walking, not elsewhere classified: Secondary | ICD-10-CM

## 2017-09-14 NOTE — Therapy (Signed)
[place], [place], [address] Phone: [phone]   Fax:  [phone]  Physical Therapy Treatment  Patient Details  Name: Joseph [name] MRN: [id number] Date of Birth: 27-Jan-1941 Referring Provider: Dr. [name]   Encounter Date: 09/14/2017  PT End of Session - 09/14/17 1128    Visit Number  5    Number of Visits  12    Date for PT Re-Evaluation  10/11/17    PT Start Time  1016    PT Stop Time  1113    PT Time Calculation (min)  57 min    Activity Tolerance  Patient tolerated treatment well    Behavior During Therapy  [facility] for tasks assessed/performed       Past Medical History:  Diagnosis Date  . Acute on chronic diastolic CHF (congestive heart failure), NYHA class 3 ([place]) 07/23/2014  . Arthritis    left knee and back arthrtis  . Atrial fibrillation with rapid ventricular response ([place]) 07/04/2014  . CAD (coronary artery disease)   . Cataract   . Cirrhosis ([place])    By radiography, seen on CT  . Diverticulosis   . Emphysema   . Enlarged prostate   . Headache    when younger but not any longer  . Heart murmur    teen  . History of abdominal aortic aneurysm (AAA)   . History of colon polyps 2006,2008  . History of transfusion 2015  . Hx of radiation therapy 09/02/11 to 10/19/11   chest  . Hx of radiation therapy   . Hyperlipidemia    takes Pravastatin  . Hypothyroidism    takes Synthroid daily  . Inguinal hernia   . Lung cancer ([place])    right upper lobe  . Personal history of adenomatous colonic polyps 07/04/2012   2006, adenomatous right colon polyp removed, then ablated in 2007 followup, 2008, adenomatous polyp of the right colon removed with hot biopsy forceps. All by Dr. Lajoyce Corners 01/03/2013 diminutive polyp ascending colon - adenoma    . Rosacea   . Shortness of breath    with exertion  . Small bowel obstruction ([place]) 07/05/2014  . Status post chemotherapy    last chemo 2013  .  Umbilical hernia, incarcerated- surgery 07/10/14 07/05/2014    Past Surgical History:  Procedure Laterality Date  . ABDOMINAL AORTAGRAM  10/20/2013   Procedure: ABDOMINAL Maxcine Ham;  Surgeon: Elam Dutch, MD;  Location: [facility] CATH LAB;  Service: Cardiovascular;;  . ABDOMINAL AORTIC ANEURYSM REPAIR N/A 10/31/2013   Procedure: aorto to left external iliac and right external iliac and left internal iliac, reimplantation of inferior mesenteric artery and ligation of left iliac artery aneursym;  Surgeon: Elam Dutch, MD;  Location: [place];  Service: Vascular;  Laterality: N/A;  . ABDOMINAL WOUND DEHISCENCE N/A 11/10/2013   Procedure: ABDOMINAL WOUND CLOSURE ;  Surgeon: Serafina Mitchell, MD;  Location: [facility];  Service: Vascular;  Laterality: N/A;  . bletharoplasty    . BOWEL RESECTION N/A 07/10/2014   Procedure: SMALL BOWEL RESECTION;  Surgeon: [name], MD;  Location: [place];  Service: General;  Laterality: N/A;  . CARDIAC CATHETERIZATION  2003  . CATARACT EXTRACTION, BILATERAL    . COLONOSCOPY W/ BIOPSIES     multiple   . ESOPHAGOGASTRODUODENOSCOPY  2006  . FINGER SURGERY  2008   left pointer finger  . HERNIA REPAIR  70's   x3  . INCISIONAL HERNIA REPAIR N/A 07/10/2014  Procedure: HERNIA REPAIR INCISIONAL;  Surgeon: [name], MD;  Location: [place];  Service: General;  Laterality: N/A;  . KNEE ARTHROSCOPY  2008   left knee  . LAPAROTOMY N/A 07/10/2014   Procedure: EXPLORATORY LAPAROTOMY;  Surgeon: [name], MD;  Location: [place];  Service: General;  Laterality: N/A;  . LOWER EXTREMITY ANGIOGRAM Bilateral 10/20/2013   Procedure: LOWER EXTREMITY ANGIOGRAM;  Surgeon: Elam Dutch, MD;  Location: [facility] CATH LAB;  Service: Cardiovascular;  Laterality: Bilateral;  . LYSIS OF ADHESION N/A 07/10/2014   Procedure: LYSIS OF ADHESION - 90 minutes;  Surgeon: [name], MD;  Location: [place];  Service: General;  Laterality: N/A;  . PORTACATH PLACEMENT  08/10/2011   Procedure:  INSERTION PORT-A-CATH;  Surgeon: Pierre Bali, MD;  Location: [place];  Service: Thoracic;  Laterality: Left;  Left Subclavian  . PTOSIS REPAIR Bilateral 12-02-2015  . THROMBECTOMY ILIAC ARTERY Bilateral 10/31/2013   Procedure: THROMBECTOMY ILIAC ARTERY;  Surgeon: Elam Dutch, MD;  Location: [place];  Service: Vascular;  Laterality: Bilateral;  . TONSILLECTOMY     as a child  . TOTAL KNEE ARTHROPLASTY Left 08/03/2017   Procedure: LEFT TOTAL KNEE ARTHROPLASTY;  Surgeon: [name], MD;  Location: [place];  Service: Orthopedics;  Laterality: Left;  Marland Kitchen VASECTOMY  70's  . VENTRAL HERNIA REPAIR N/A 04/04/2015   Procedure: OPEN REPAIR OF RECURRENT ABDOMINAL WALL HERNIA WITH MESH, COMPLEX LYSIS OF ADHESIONS X1 HR TAR COMPONENT SEPARATION;  Surgeon: Greer Pickerel, MD;  Location[facility];  Service: General;  Laterality: N/A;    There were no vitals filed for this visit.  Subjective Assessment - 09/14/17 1021    Subjective  Pain and stiff at night.      Currently in Pain?  Yes    Pain Score  3     Pain Location  Knee    Pain Orientation  Left    Pain Type  Surgical pain;Acute pain    Pain Onset  1 to 4 weeks ago    Pain Frequency  Intermittent    Aggravating Factors   PM, hamstring stretch , standing too long    Pain Relieving Factors  ice, position            [facility] Adult PT Treatment/Exercise - 09/14/17 0001      Knee/Hip Exercises: Stretches   Active Hamstring Stretch  Left;3 reps;30 seconds    ITB Stretch  Left;3 reps;30 seconds    Other Knee/Hip Stretches  seated hamstring end of session       Knee/Hip Exercises: Aerobic   Stationary Bike  5 min L4      Knee/Hip Exercises: Seated   Long Arc Quad  Strengthening;Left;1 set;20 reps;Weights    Long Arc Quad Weight  4 lbs.      Knee/Hip Exercises: Supine   Short Arc Quad Sets  Strengthening;Left;1 set;20 reps 4 lbs     Straight Leg Raises  Strengthening;Left;1 set;15 reps    Straight Leg Raise with External Rotation   Strengthening;Left;1 set;15 reps      Knee/Hip Exercises: Prone   Hip Extension  Strengthening;Left;1 set;10 reps    Prone Knee Hang  3 minutes    Other Prone Exercises  prone quad sets x 10       Cryotherapy   Number Minutes Cryotherapy  10 Minutes    Cryotherapy Location  Knee    Type of Cryotherapy  Ice pack      Manual  Therapy   Manual Therapy  Taping    Joint Mobilization  Gr II-III extension femur on tibia in supine     Kinesiotex  Edema      Kinesiotix   Edema  2 fans for swelling anterior knee                PT Short Term Goals - 09/14/17 1131      PT SHORT TERM GOAL #1   Title  He will be independent with initial HEp    Status  Achieved      PT SHORT TERM GOAL #2   Title  pain with activity decreased >/= 25%    Baseline  improving     Status  Partially Met        PT Long Term Goals - 08/30/17 1204      PT LONG TERM GOAL #1   Title  Pt will improve FOTO score to <50% impaired to demo improvement in functional mobility.     Time  6    Period  Weeks    Status  New    Target Date  10/11/17      PT LONG TERM GOAL #2   Title  He will be independent with all HEP issued    Time  6    Period  Weeks    Status  New    Target Date  10/11/17      PT LONG TERM GOAL #3   Title  Pt will be able to walk as needed in the community without limitation of pain.     Time  6    Period  Weeks    Status  New    Target Date  10/11/17      PT LONG TERM GOAL #4   Title  He will report gradual return to gym to increase fitness and lose weight    Time  6    Period  Weeks    Status  New    Target Date  10/11/17      PT LONG TERM GOAL #5   Title  Pt will demo Lt knee AROM in extension to lacking no more than 5 deg for proper gait mechanics     Time  6    Period  Weeks    Status  New    Target Date  10/11/17            Plan - 09/14/17 1132    Clinical Impression Statement  Pt cont with intermittent pain and swelling in L knee.  Worked on knee extension  ROM today, 11 deg prior to exercise.  PROM not measured but well tolerted to what apeared to be < 5 deg.  tRial of KT tape for edema.      PT Treatment/Interventions  ADLs/Self Care Home Management;Patient/family education;Passive range of motion;Therapeutic activities;Moist Heat;Cryotherapy;Electrical Stimulation;Gait training;Neuromuscular re-education;Manual techniques;Taping;Manual lymph drainage;Vasopneumatic Device;Therapeutic exercise;Balance training    PT Next Visit Plan  review gait pattern- heel strike with knee straight, quad strengthening, hip extensor strengthening, monitor superior aspect of incision    PT Home Exercise Plan  issued HEP for knee flexibility, seated HSS, sidelying ITB stretch, gastroc stretch on stairs, heel raises on step    Consulted and Agree with Plan of Care  Patient       Patient will benefit from skilled therapeutic intervention in order to improve the following deficits and impairments:  Decreased range of motion, Difficulty walking, Increased fascial restricitons, Pain, Impaired  flexibility, Hypomobility, Decreased balance, Decreased mobility, Decreased strength, Increased edema  Visit Diagnosis: No diagnosis found.     Problem List Patient Active Problem List   Diagnosis Date Noted  . Primary osteoarthritis of left knee 08/03/2017  . S/P total knee replacement using cement, left 08/03/2017  . SBO (small bowel obstruction) ([place]) 03/18/2017  . Elevated d-dimer   . Hypoxia   . SOB (shortness of breath)   . Small bowel obstruction ([place]) 10/20/2016  . AKI (acute kidney injury) ([place]) 10/20/2016  . Acute respiratory failure with hypoxia ([place]) 10/20/2016  . Chronic pain of left knee 07/06/2016  . Port catheter in place 02/27/2016  . Claudication ([place]) 02/18/2016  . Statin intolerance 02/18/2016  . Essential hypertension 02/18/2016  . Coronary artery disease due to lipid rich plaque 08/29/2015  . Acute on chronic diastolic CHF (congestive heart  failure), NYHA class 2 ([place]) 08/29/2015  . 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 Hypoxemia requiring supplemental oxygen   . Atrial fibrillation ([place]) 07/23/2014  . COPD (chronic obstructive pulmonary disease) ([place]) 07/06/2014  . Hypothyroidism 07/06/2014  . Numbness-Left Thigh 12/14/2013  . AAA- s/p Ao-Iliac BPG March 2015 10/20/2013  . Hyperlipidemia 10/09/2013  . Hepatic cirrhosis ([place]) 12/02/2012  . Cancer of right lung [facility]) 08/06/2011    PAA,JENNIFER 09/14/2017, 12:55 PM  [facility] [address] [place], [place], [address] Phone: [phone]   Fax:  [phone]  Name: Joseph [name] MRN: [id number] Date of Birth: 03-Apr-1941  Raeford Razor, PT 09/14/17 12:56 PM Phone: [phone] Fax: [phone]

## 2017-09-15 ENCOUNTER — Inpatient Hospital Stay: Payer: PPO | Attending: Internal Medicine

## 2017-09-15 VITALS — BP 136/85 | HR 90 | Temp 97.8°F | Resp 20

## 2017-09-15 DIAGNOSIS — C3411 Malignant neoplasm of upper lobe, right bronchus or lung: Secondary | ICD-10-CM | POA: Diagnosis not present

## 2017-09-15 DIAGNOSIS — Z95828 Presence of other vascular implants and grafts: Secondary | ICD-10-CM

## 2017-09-15 DIAGNOSIS — Z452 Encounter for adjustment and management of vascular access device: Secondary | ICD-10-CM | POA: Diagnosis not present

## 2017-09-15 MED ORDER — SODIUM CHLORIDE 0.9 % IJ SOLN
10.0000 mL | INTRAMUSCULAR | Status: DC | PRN
  Administered 2017-09-15: 10 mL via INTRAVENOUS
  Filled 2017-09-15: qty 10

## 2017-09-15 MED ORDER — HEPARIN SOD (PORK) LOCK FLUSH 100 UNIT/ML IV SOLN
500.0000 [IU] | Freq: Once | INTRAVENOUS | Status: AC | PRN
  Administered 2017-09-15: 500 [IU] via INTRAVENOUS
  Filled 2017-09-15: qty 5

## 2017-09-15 NOTE — Patient Instructions (Signed)
Implanted Port Home Guide An implanted port is a type of central line that is placed under the skin. Central lines are used to provide IV access when treatment or nutrition needs to be given through a person's veins. Implanted ports are used for long-term IV access. An implanted port may be placed because:  You need IV medicine that would be irritating to the small veins in your hands or arms.  You need long-term IV medicines, such as antibiotics.  You need IV nutrition for a long period.  You need frequent blood draws for lab tests.  You need dialysis.  Implanted ports are usually placed in the chest area, but they can also be placed in the upper arm, the abdomen, or the leg. An implanted port has two main parts:  Reservoir. The reservoir is round and will appear as a small, raised area under your skin. The reservoir is the part where a needle is inserted to give medicines or draw blood.  Catheter. The catheter is a thin, flexible tube that extends from the reservoir. The catheter is placed into a large vein. Medicine that is inserted into the reservoir goes into the catheter and then into the vein.  How will I care for my incision site? Do not get the incision site wet. Bathe or shower as directed by your health care provider. How is my port accessed? Special steps must be taken to access the port:  Before the port is accessed, a numbing cream can be placed on the skin. This helps numb the skin over the port site.  Your health care provider uses a sterile technique to access the port. ? Your health care provider must put on a mask and sterile gloves. ? The skin over your port is cleaned carefully with an antiseptic and allowed to dry. ? The port is gently pinched between sterile gloves, and a needle is inserted into the port.  Only "non-coring" port needles should be used to access the port. Once the port is accessed, a blood return should be checked. This helps ensure that the port  is in the vein and is not clogged.  If your port needs to remain accessed for a constant infusion, a clear (transparent) bandage will be placed over the needle site. The bandage and needle will need to be changed every week, or as directed by your health care provider.  Keep the bandage covering the needle clean and dry. Do not get it wet. Follow your health care provider's instructions on how to take a shower or bath while the port is accessed.  If your port does not need to stay accessed, no bandage is needed over the port.  What is flushing? Flushing helps keep the port from getting clogged. Follow your health care provider's instructions on how and when to flush the port. Ports are usually flushed with saline solution or a medicine called heparin. The need for flushing will depend on how the port is used.  If the port is used for intermittent medicines or blood draws, the port will need to be flushed: ? After medicines have been given. ? After blood has been drawn. ? As part of routine maintenance.  If a constant infusion is running, the port may not need to be flushed.  How long will my port stay implanted? The port can stay in for as long as your health care provider thinks it is needed. When it is time for the port to come out, surgery will be   done to remove it. The procedure is similar to the one performed when the port was put in. When should I seek immediate medical care? When you have an implanted port, you should seek immediate medical care if:  You notice a bad smell coming from the incision site.  You have swelling, redness, or drainage at the incision site.  You have more swelling or pain at the port site or the surrounding area.  You have a fever that is not controlled with medicine.  This information is not intended to replace advice given to you by your health care provider. Make sure you discuss any questions you have with your health care provider. Document  Released: 08/17/2005 Document Revised: 01/23/2016 Document Reviewed: 04/24/2013 Elsevier Interactive Patient Education  2017 Elsevier Inc.  

## 2017-09-17 ENCOUNTER — Ambulatory Visit: Payer: PPO | Admitting: Physical Therapy

## 2017-09-17 ENCOUNTER — Encounter: Payer: Self-pay | Admitting: Physical Therapy

## 2017-09-17 DIAGNOSIS — R600 Localized edema: Secondary | ICD-10-CM

## 2017-09-17 DIAGNOSIS — M545 Low back pain: Secondary | ICD-10-CM

## 2017-09-17 DIAGNOSIS — M25562 Pain in left knee: Secondary | ICD-10-CM

## 2017-09-17 DIAGNOSIS — R262 Difficulty in walking, not elsewhere classified: Secondary | ICD-10-CM

## 2017-09-17 DIAGNOSIS — G8929 Other chronic pain: Secondary | ICD-10-CM

## 2017-09-17 NOTE — Therapy (Signed)
[place], [place], [address] Phone: [phone]   Fax:  [phone]  Physical Therapy Treatment  Patient Details  Name: Joseph [name] MRN: [id number] Date of Birth: 1940-11-28 Referring Provider: Dr. [name]   Encounter Date: 09/17/2017  PT End of Session - 09/17/17 1038    Visit Number  6    Number of Visits  12    Date for PT Re-Evaluation  10/11/17    PT Start Time  1017    PT Stop Time  1110    PT Time Calculation (min)  53 min    Activity Tolerance  Patient tolerated treatment well    Behavior During Therapy  [facility] for tasks assessed/performed       Past Medical History:  Diagnosis Date  . Acute on chronic diastolic CHF (congestive heart failure), NYHA class 3 ([place]) 07/23/2014  . Arthritis    left knee and back arthrtis  . Atrial fibrillation with rapid ventricular response ([place]) 07/04/2014  . CAD (coronary artery disease)   . Cataract   . Cirrhosis ([place])    By radiography, seen on CT  . Diverticulosis   . Emphysema   . Enlarged prostate   . Headache    when younger but not any longer  . Heart murmur    teen  . History of abdominal aortic aneurysm (AAA)   . History of colon polyps 2006,2008  . History of transfusion 2015  . Hx of radiation therapy 09/02/11 to 10/19/11   chest  . Hx of radiation therapy   . Hyperlipidemia    takes Pravastatin  . Hypothyroidism    takes Synthroid daily  . Inguinal hernia   . Lung cancer ([place])    right upper lobe  . Personal history of adenomatous colonic polyps 07/04/2012   2006, adenomatous right colon polyp removed, then ablated in 2007 followup, 2008, adenomatous polyp of the right colon removed with hot biopsy forceps. All by Dr. Lajoyce Corners 01/03/2013 diminutive polyp ascending colon - adenoma    . Rosacea   . Shortness of breath    with exertion  . Small bowel obstruction ([place]) 07/05/2014  . Status post chemotherapy    last chemo 2013  .  Umbilical hernia, incarcerated- surgery 07/10/14 07/05/2014    Past Surgical History:  Procedure Laterality Date  . ABDOMINAL AORTAGRAM  10/20/2013   Procedure: ABDOMINAL Maxcine Ham;  Surgeon: Elam Dutch, MD;  Location: [facility] CATH LAB;  Service: Cardiovascular;;  . ABDOMINAL AORTIC ANEURYSM REPAIR N/A 10/31/2013   Procedure: aorto to left external iliac and right external iliac and left internal iliac, reimplantation of inferior mesenteric artery and ligation of left iliac artery aneursym;  Surgeon: Elam Dutch, MD;  Location: [place];  Service: Vascular;  Laterality: N/A;  . ABDOMINAL WOUND DEHISCENCE N/A 11/10/2013   Procedure: ABDOMINAL WOUND CLOSURE ;  Surgeon: Serafina Mitchell, MD;  Location: [place];  Service: Vascular;  Laterality: N/A;  . bletharoplasty    . BOWEL RESECTION N/A 07/10/2014   Procedure: SMALL BOWEL RESECTION;  Surgeon: [name], MD;  Location: [place];  Service: General;  Laterality: N/A;  . CARDIAC CATHETERIZATION  2003  . CATARACT EXTRACTION, BILATERAL    . COLONOSCOPY W/ BIOPSIES     multiple   . ESOPHAGOGASTRODUODENOSCOPY  2006  . FINGER SURGERY  2008   left pointer finger  . HERNIA REPAIR  70's   x3  . INCISIONAL HERNIA REPAIR N/A 07/10/2014  Procedure: HERNIA REPAIR INCISIONAL;  Surgeon: Eric M Wilson, MD;  [facility];  Service: General;  Laterality: N/A;  . KNEE ARTHROSCOPY  2008   left knee  . LAPAROTOMY N/A 07/10/2014   Procedure: EXPLORATORY LAPAROTOMY;  Surgeon: Eric M Wilson, MD;  [facility];  Service: General;  Laterality: N/A;  . LOWER EXTREMITY ANGIOGRAM Bilateral 10/20/2013   Procedure: LOWER EXTREMITY ANGIOGRAM;  Surgeon: Charles E Fields, MD;  [facility] CATH LAB;  Service: Cardiovascular;  Laterality: Bilateral;  . LYSIS OF ADHESION N/A 07/10/2014   Procedure: LYSIS OF ADHESION - 90 minutes;  Surgeon: Eric M Wilson, MD;  [facility];  Service: General;  Laterality: N/A;  . PORTACATH PLACEMENT  08/10/2011   Procedure:  INSERTION PORT-A-CATH;  Surgeon: D Patrick Burney, MD;  [facility];  Service: Thoracic;  Laterality: Left;  Left Subclavian  . PTOSIS REPAIR Bilateral 12-02-2015  . THROMBECTOMY ILIAC ARTERY Bilateral 10/31/2013   Procedure: THROMBECTOMY ILIAC ARTERY;  Surgeon: Charles E Fields, MD;  [facility];  Service: Vascular;  Laterality: Bilateral;  . TONSILLECTOMY     as a child  . TOTAL KNEE ARTHROPLASTY Left 08/03/2017   Procedure: LEFT TOTAL KNEE ARTHROPLASTY;  Surgeon: Whitfield, Peter W, MD;  [facility];  Service: Orthopedics;  Laterality: Left;  . VASECTOMY  70's  . VENTRAL HERNIA REPAIR N/A 04/04/2015   Procedure: OPEN REPAIR OF RECURRENT ABDOMINAL WALL HERNIA WITH MESH, COMPLEX LYSIS OF ADHESIONS X1 HR TAR COMPONENT SEPARATION;  Surgeon: Eric Wilson, MD;  [facility];  Service: General;  Laterality: N/A;    There were no vitals filed for this visit.  Subjective Assessment - 09/17/17 1024    Subjective  Nights are so hard.  Took a pain pill last night.  Knee aches and my L glute hurts     Currently in Pain?  No/denies    Pain Location  Knee    Pain Orientation  Left    Multiple Pain Sites  Yes    Pain Score  7 PM     Pain Location  Hip         [facility] PT Assessment - 09/17/17 0001      Circumferential Edema   Circumferential - Right  17 1/4 inch     Circumferential - Left   18 1/4 inch                   [facility] Adult PT Treatment/Exercise - 09/17/17 0001      Knee/Hip Exercises: Stretches   Active Hamstring Stretch  Left;3 reps;30 seconds    ITB Stretch  Left;3 reps;30 seconds    Gastroc Stretch  Both;2 reps    Soleus Stretch  Both;2 reps      Knee/Hip Exercises: Standing   Hip Abduction  Stengthening;Both;1 set;10 reps    Hip Extension  --    Lateral Step Up  Left;1 set;20 reps;Hand Hold: 2;Step Height: 8"    Forward Step Up  Left;1 set;20 reps;Hand Hold: 2;Step Height: 8"      Knee/Hip Exercises: Prone   Contract/Relax to Increase Flexion  x 5      Prone Knee Hang  3 minutes    Other Prone Exercises  prone knee bend x 10 with strap, 10 sec hold       Cryotherapy   Number Minutes Cryotherapy  10 Minutes    Cryotherapy Location  Knee    Type of Cryotherapy  Ice pack        Manual Therapy   Manual Therapy  Joint mobilization;Soft tissue mobilization;Passive ROM    Manual therapy comments  retro massage     Joint Mobilization  Gr II-III extension femur on tibia in supine  patellar mobs all planes              PT Education - 09/17/17 1059    Education provided  Yes    Education Details  swelling, measurements, step ups     Person(s) Educated  Patient    Methods  Explanation;Demonstration    Comprehension  Verbalized understanding       PT Short Term Goals - 09/14/17 1131      PT SHORT TERM GOAL #1   Title  He will be independent with initial HEp    Status  Achieved      PT SHORT TERM GOAL #2   Title  pain with activity decreased >/= 25%    Baseline  improving     Status  Partially Met        PT Long Term Goals - 08/30/17 1204      PT LONG TERM GOAL #1   Title  Pt will improve FOTO score to <50% impaired to demo improvement in functional mobility.     Time  6    Period  Weeks    Status  New    Target Date  10/11/17      PT LONG TERM GOAL #2   Title  He will be independent with all HEP issued    Time  6    Period  Weeks    Status  New    Target Date  10/11/17      PT LONG TERM GOAL #3   Title  Pt will be able to walk as needed in the community without limitation of pain.     Time  6    Period  Weeks    Status  New    Target Date  10/11/17      PT LONG TERM GOAL #4   Title  He will report gradual return to gym to increase fitness and lose weight    Time  6    Period  Weeks    Status  New    Target Date  10/11/17      PT LONG TERM GOAL #5   Title  Pt will demo Lt knee AROM in extension to lacking no more than 5 deg for proper gait mechanics     Time  6    Period  Weeks    Status  New     Target Date  10/11/17            Plan - 09/17/17 1203    Clinical Impression Statement  Swelling slightly improved over superior aspect of L knee from start of PT.  He cont to have pain at night in knee as well as L hip.  He wa sbale to perform step ups but needed UE assist.  Goals in progress.     PT Treatment/Interventions  ADLs/Self Care Home Management;Patient/family education;Passive range of motion;Therapeutic activities;Moist Heat;Cryotherapy;Electrical Stimulation;Gait training;Neuromuscular re-education;Manual techniques;Taping;Manual lymph drainage;Vasopneumatic Device;Therapeutic exercise;Balance training    PT Next Visit Plan  review gait pattern- heel strike with knee straight, quad strengthening, hip extensor strengthening, monitor superior aspect of incision    PT Home Exercise Plan  issued HEP for knee flexibility, seated HSS, sidelying ITB stretch, gastroc stretch on stairs, heel raises on step      Consulted and Agree with Plan of Care  Patient       Patient will benefit from skilled therapeutic intervention in order to improve the following deficits and impairments:  Decreased range of motion, Difficulty walking, Increased fascial restricitons, Pain, Impaired flexibility, Hypomobility, Decreased balance, Decreased mobility, Decreased strength, Increased edema  Visit Diagnosis: Localized edema  Acute pain of left knee  Difficulty in walking, not elsewhere classified  Chronic low back pain, unspecified back pain laterality, with sciatica presence unspecified     Problem List Patient Active Problem List   Diagnosis Date Noted  . Primary osteoarthritis of left knee 08/03/2017  . S/P total knee replacement using cement, left 08/03/2017  . SBO (small bowel obstruction) ([facility]) 03/18/2017  . Elevated d-dimer   . Hypoxia   . SOB (shortness of breath)   . Small bowel obstruction ([facility]) 10/20/2016  . AKI (acute kidney injury) ([facility]) 10/20/2016  . Acute respiratory  failure with hypoxia ([facility]) 10/20/2016  . Chronic pain of left knee 07/06/2016  . Port catheter in place 02/27/2016  . Claudication ([facility]) 02/18/2016  . Statin intolerance 02/18/2016  . Essential hypertension 02/18/2016  . Coronary artery disease due to lipid rich plaque 08/29/2015  . Acute on chronic diastolic CHF (congestive heart failure), NYHA class 2 ([facility]) 08/29/2015  . 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 Hypoxemia requiring supplemental oxygen   . Atrial fibrillation ([facility]) 07/23/2014  . COPD (chronic obstructive pulmonary disease) ([facility]) 07/06/2014  . Hypothyroidism 07/06/2014  . Numbness-Left Thigh 12/14/2013  . AAA- s/p Ao-Iliac BPG March 2015 10/20/2013  . Hyperlipidemia 10/09/2013  . Hepatic cirrhosis ([facility]) 12/02/2012  . Cancer of right lung ([facility]) 08/06/2011    PAA,JENNIFER 09/17/2017, 12:05 PM   [facility] [address] [place], [place], [address] Phone: [phone]   Fax:  [phone]  Name: Joseph [name] MRN: [id number] Date of Birth: 12/31/1940  Jennifer Paa, PT 09/17/17 12:06 PM Phone: [phone] Fax: [phone]  

## 2017-09-21 ENCOUNTER — Ambulatory Visit: Payer: PPO | Admitting: Physical Therapy

## 2017-09-21 ENCOUNTER — Encounter: Payer: Self-pay | Admitting: Physical Therapy

## 2017-09-21 DIAGNOSIS — M25562 Pain in left knee: Secondary | ICD-10-CM

## 2017-09-21 DIAGNOSIS — R293 Abnormal posture: Secondary | ICD-10-CM

## 2017-09-21 DIAGNOSIS — R600 Localized edema: Secondary | ICD-10-CM

## 2017-09-21 DIAGNOSIS — M545 Low back pain: Secondary | ICD-10-CM

## 2017-09-21 DIAGNOSIS — G8929 Other chronic pain: Secondary | ICD-10-CM

## 2017-09-21 DIAGNOSIS — R262 Difficulty in walking, not elsewhere classified: Secondary | ICD-10-CM

## 2017-09-21 NOTE — Therapy (Signed)
[place], [place], [address] Phone: [phone]   Fax:  [phone]  Physical Therapy Treatment  Patient Details  Name: Joseph [name] MRN: [id number] Date of Birth: 1941-07-09 Referring Provider: Dr. [name]   Encounter Date: 09/21/2017  PT End of Session - 09/21/17 2044    Visit Number  7    Number of Visits  12    Date for PT Re-Evaluation  10/11/17    PT Start Time  1020    PT Stop Time  1114    PT Time Calculation (min)  54 min    Activity Tolerance  Patient tolerated treatment well    Behavior During Therapy  [facility] for tasks assessed/performed       Past Medical History:  Diagnosis Date  . Acute on chronic diastolic CHF (congestive heart failure), NYHA class 3 ([place]) 07/23/2014  . Arthritis    left knee and back arthrtis  . Atrial fibrillation with rapid ventricular response ([place]) 07/04/2014  . CAD (coronary artery disease)   . Cataract   . Cirrhosis ([place])    By radiography, seen on CT  . Diverticulosis   . Emphysema   . Enlarged prostate   . Headache    when younger but not any longer  . Heart murmur    teen  . History of abdominal aortic aneurysm (AAA)   . History of colon polyps 2006,2008  . History of transfusion 2015  . Hx of radiation therapy 09/02/11 to 10/19/11   chest  . Hx of radiation therapy   . Hyperlipidemia    takes Pravastatin  . Hypothyroidism    takes Synthroid daily  . Inguinal hernia   . Lung cancer ([place])    right upper lobe  . Personal history of adenomatous colonic polyps 07/04/2012   2006, adenomatous right colon polyp removed, then ablated in 2007 followup, 2008, adenomatous polyp of the right colon removed with hot biopsy forceps. All by Dr. Lajoyce Corners 01/03/2013 diminutive polyp ascending colon - adenoma    . Rosacea   . Shortness of breath    with exertion  . Small bowel obstruction ([place]) 07/05/2014  . Status post chemotherapy    last chemo 2013  .  Umbilical hernia, incarcerated- surgery 07/10/14 07/05/2014    Past Surgical History:  Procedure Laterality Date  . ABDOMINAL AORTAGRAM  10/20/2013   Procedure: ABDOMINAL Maxcine Ham;  Surgeon: Elam Dutch, MD;  Location: [facility] CATH LAB;  Service: Cardiovascular;;  . ABDOMINAL AORTIC ANEURYSM REPAIR N/A 10/31/2013   Procedure: aorto to left external iliac and right external iliac and left internal iliac, reimplantation of inferior mesenteric artery and ligation of left iliac artery aneursym;  Surgeon: Elam Dutch, MD;  Location: [place];  Service: Vascular;  Laterality: N/A;  . ABDOMINAL WOUND DEHISCENCE N/A 11/10/2013   Procedure: ABDOMINAL WOUND CLOSURE ;  Surgeon: Serafina Mitchell, MD;  Location: [place];  Service: Vascular;  Laterality: N/A;  . bletharoplasty    . BOWEL RESECTION N/A 07/10/2014   Procedure: SMALL BOWEL RESECTION;  Surgeon: [name], MD;  Location: [place];  Service: General;  Laterality: N/A;  . CARDIAC CATHETERIZATION  2003  . CATARACT EXTRACTION, BILATERAL    . COLONOSCOPY W/ BIOPSIES     multiple   . ESOPHAGOGASTRODUODENOSCOPY  2006  . FINGER SURGERY  2008   left pointer finger  . HERNIA REPAIR  70's   x3  . INCISIONAL HERNIA REPAIR N/A 07/10/2014  Procedure: HERNIA REPAIR INCISIONAL;  Surgeon: [name], MD;  Location: [place];  Service: General;  Laterality: N/A;  . KNEE ARTHROSCOPY  2008   left knee  . LAPAROTOMY N/A 07/10/2014   Procedure: EXPLORATORY LAPAROTOMY;  Surgeon: [name], MD;  Location: [place];  Service: General;  Laterality: N/A;  . LOWER EXTREMITY ANGIOGRAM Bilateral 10/20/2013   Procedure: LOWER EXTREMITY ANGIOGRAM;  Surgeon: Elam Dutch, MD;  Location: [facility] CATH LAB;  Service: Cardiovascular;  Laterality: Bilateral;  . LYSIS OF ADHESION N/A 07/10/2014   Procedure: LYSIS OF ADHESION - 90 minutes;  Surgeon: [name], MD;  Location: [place];  Service: General;  Laterality: N/A;  . PORTACATH PLACEMENT  08/10/2011   Procedure:  INSERTION PORT-A-CATH;  Surgeon: Pierre Bali, MD;  Location: [place];  Service: Thoracic;  Laterality: Left;  Left Subclavian  . PTOSIS REPAIR Bilateral 12-02-2015  . THROMBECTOMY ILIAC ARTERY Bilateral 10/31/2013   Procedure: THROMBECTOMY ILIAC ARTERY;  Surgeon: Elam Dutch, MD;  Location: [place];  Service: Vascular;  Laterality: Bilateral;  . TONSILLECTOMY     as a child  . TOTAL KNEE ARTHROPLASTY Left 08/03/2017   Procedure: LEFT TOTAL KNEE ARTHROPLASTY;  Surgeon: [name], MD;  Location: [place];  Service: Orthopedics;  Laterality: Left;  Marland Kitchen VASECTOMY  70's  . VENTRAL HERNIA REPAIR N/A 04/04/2015   Procedure: OPEN REPAIR OF RECURRENT ABDOMINAL WALL HERNIA WITH MESH, COMPLEX LYSIS OF ADHESIONS X1 HR TAR COMPONENT SEPARATION;  Surgeon: Greer Pickerel, MD;  [facility];  Service: General;  Laterality: N/A;    There were no vitals filed for this visit.  Subjective Assessment - 09/21/17 1050    Subjective  Had a bad night.  Pain posteriorly when I bend it.      Currently in Pain?  No/denies    Pain Score  -- last night, was a 9/10    Pain Location  Knee    Pain Orientation  Left    Pain Descriptors / Indicators  Aching;Tightness    Pain Type  Surgical pain;Chronic pain    Pain Onset  More than a month ago    Pain Frequency  Intermittent          [facility] Adult PT Treatment/Exercise - 09/21/17 0001      Knee/Hip Exercises: Aerobic   Nustep  6 min L7 LE only       Knee/Hip Exercises: Supine   Bridges with Ball Squeeze  Strengthening;Both;1 set;10 reps    Straight Leg Raise with External Rotation  Strengthening;Left;1 set;15 reps      Cryotherapy   Number Minutes Cryotherapy  10 Minutes    Cryotherapy Location  Knee    Type of Cryotherapy  Ice pack      Manual Therapy   Manual therapy comments  retro massage     Soft tissue mobilization  quads , lateral aspect and         Pilates Reformer used for LE/core strength, postural strength, lumbopelvic disassociation  and core control.  Exercises included: Footwork 2 Red 1 blue double leg bend and stretch in various positions, quite difficult to maintain neutral hip alignment and to get full knee extension.   Done with single leg as well.  Increased time to position and cue, repetition.  Heel raises x 10 for post knee stretch.          PT Education - 09/21/17 2043    Education provided  Yes  Education Details  Pilates (basic) Reformer, benefits and core training    Person(s) Educated  Patient    Methods  Explanation;Demonstration;Tactile cues;Verbal cues    Comprehension  Verbalized understanding;Need further instruction;Tactile cues required;Verbal cues required       PT Short Term Goals - 09/14/17 1131      PT SHORT TERM GOAL #1   Title  He will be independent with initial HEp    Status  Achieved      PT SHORT TERM GOAL #2   Title  pain with activity decreased >/= 25%    Baseline  improving     Status  Partially Met        PT Long Term Goals - 08/30/17 1204      PT LONG TERM GOAL #1   Title  Pt will improve FOTO score to <50% impaired to demo improvement in functional mobility.     Time  6    Period  Weeks    Status  New    Target Date  10/11/17      PT LONG TERM GOAL #2   Title  He will be independent with all HEP issued    Time  6    Period  Weeks    Status  New    Target Date  10/11/17      PT LONG TERM GOAL #3   Title  Pt will be able to walk as needed in the community without limitation of pain.     Time  6    Period  Weeks    Status  New    Target Date  10/11/17      PT LONG TERM GOAL #4   Title  He will report gradual return to gym to increase fitness and lose weight    Time  6    Period  Weeks    Status  New    Target Date  10/11/17      PT LONG TERM GOAL #5   Title  Pt will demo Lt knee AROM in extension to lacking no more than 5 deg for proper gait mechanics     Time  6    Period  Weeks    Status  New    Target Date  10/11/17             Plan - 09/21/17 2046    Clinical Impression Statement  Pocket of swelling is present but appears to be decreasing overall.  We tried the Reformer today, needed heavy cues to full extend knee and activate core.  He has pain with knee flexion. Compensatory gait noted.  Making slow gains, pain in PM hours.      PT Treatment/Interventions  ADLs/Self Care Home Management;Patient/family education;Passive range of motion;Therapeutic activities;Moist Heat;Cryotherapy;Electrical Stimulation;Gait training;Neuromuscular re-education;Manual techniques;Taping;Manual lymph drainage;Vasopneumatic Device;Therapeutic exercise;Balance training    PT Next Visit Plan  check specific goals, FOTO, gait check , how was Reformer , hip ext strength    PT Home Exercise Plan  issued HEP for knee flexibility, seated HSS, sidelying ITB stretch, gastroc stretch on stairs, heel raises on step    Consulted and Agree with Plan of Care  Patient       Patient will benefit from skilled therapeutic intervention in order to improve the following deficits and impairments:  Decreased range of motion, Difficulty walking, Increased fascial restricitons, Pain, Impaired flexibility, Hypomobility, Decreased balance, Decreased mobility, Decreased strength, Increased edema  Visit Diagnosis: Localized edema  Acute pain  of left knee  Difficulty in walking, not elsewhere classified  Chronic low back pain, unspecified back pain laterality, with sciatica presence unspecified  Abnormal posture     Problem List Patient Active Problem List   Diagnosis Date Noted  . Primary osteoarthritis of left knee 08/03/2017  . S/P total knee replacement using cement, left 08/03/2017  . SBO (small bowel obstruction) ([place]) 03/18/2017  . Elevated d-dimer   . Hypoxia   . SOB (shortness of breath)   . Small bowel obstruction ([place]) 10/20/2016  . AKI (acute kidney injury) ([place]) 10/20/2016  . Acute respiratory failure with hypoxia ([place])  10/20/2016  . Chronic pain of left knee 07/06/2016  . Port catheter in place 02/27/2016  . Claudication ([place]) 02/18/2016  . Statin intolerance 02/18/2016  . Essential hypertension 02/18/2016  . Coronary artery disease due to lipid rich plaque 08/29/2015  . Acute on chronic diastolic CHF (congestive heart failure), NYHA class 2 ([place]) 08/29/2015  . 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 Hypoxemia requiring supplemental oxygen   . Atrial fibrillation ([place]) 07/23/2014  . COPD (chronic obstructive pulmonary disease) ([place]) 07/06/2014  . Hypothyroidism 07/06/2014  . Numbness-Left Thigh 12/14/2013  . AAA- s/p Ao-Iliac BPG March 2015 10/20/2013  . Hyperlipidemia 10/09/2013  . Hepatic cirrhosis ([place]) 12/02/2012  . Cancer of right lung [facility]) 08/06/2011    Khyra Viscuso 09/21/2017, 8:52 PM  [facility] [address] [place], [place], [address] Phone: [phone]   Fax:  [phone]  Name: MAAZ SPIERING MRN: [id number] Date of Birth: 1941/04/21  Raeford Razor, PT 09/21/17 8:55 PM Phone: [phone] Fax: [phone]

## 2017-09-23 ENCOUNTER — Encounter: Payer: Self-pay | Admitting: Physical Therapy

## 2017-09-23 ENCOUNTER — Ambulatory Visit: Payer: PPO | Admitting: Physical Therapy

## 2017-09-23 DIAGNOSIS — R262 Difficulty in walking, not elsewhere classified: Secondary | ICD-10-CM

## 2017-09-23 DIAGNOSIS — R600 Localized edema: Secondary | ICD-10-CM

## 2017-09-23 DIAGNOSIS — M25562 Pain in left knee: Secondary | ICD-10-CM

## 2017-09-23 NOTE — Patient Instructions (Signed)
Back Wall Slide    With feet _25 +___ inches from wall, lean as much of back against the wall as possible. Gently squat down _8__ inches, keeping back against wall. Hold ___5-10_ seconds while counting out loud. Repeat __5-10 X__ times. Do __1__ sessions per day.   Press heels vs toes into the floor.  Issued from exercise drawer: Knee strength standing   Mini squat Daily 10 x hold 5-10 seconds  http://gt2.exer.us/563   Copyright  VHI. All rights reserved.

## 2017-09-23 NOTE — Therapy (Signed)
[place], [place], [address] Phone: [phone]   Fax:  [phone]  Physical Therapy Treatment  Patient Details  Name: Joseph [name] MRN: [id number] Date of Birth: 1940-10-02 Referring Provider: Dr. [name]   Encounter Date: 09/23/2017  PT End of Session - 09/23/17 1351    Visit Number  8    Number of Visits  12    Date for PT Re-Evaluation  10/11/17    PT Start Time  1058    PT Stop Time  1205    PT Time Calculation (min)  67 min    Activity Tolerance  Patient tolerated treatment well    Behavior During Therapy  [facility] for tasks assessed/performed       Past Medical History:  Diagnosis Date  . Acute on chronic diastolic CHF (congestive heart failure), NYHA class 3 ([place]) 07/23/2014  . Arthritis    left knee and back arthrtis  . Atrial fibrillation with rapid ventricular response ([place]) 07/04/2014  . CAD (coronary artery disease)   . Cataract   . Cirrhosis ([place])    By radiography, seen on CT  . Diverticulosis   . Emphysema   . Enlarged prostate   . Headache    when younger but not any longer  . Heart murmur    teen  . History of abdominal aortic aneurysm (AAA)   . History of colon polyps 2006,2008  . History of transfusion 2015  . Hx of radiation therapy 09/02/11 to 10/19/11   chest  . Hx of radiation therapy   . Hyperlipidemia    takes Pravastatin  . Hypothyroidism    takes Synthroid daily  . Inguinal hernia   . Lung cancer ([place])    right upper lobe  . Personal history of adenomatous colonic polyps 07/04/2012   2006, adenomatous right colon polyp removed, then ablated in 2007 followup, 2008, adenomatous polyp of the right colon removed with hot biopsy forceps. All by Dr. Lajoyce Corners 01/03/2013 diminutive polyp ascending colon - adenoma    . Rosacea   . Shortness of breath    with exertion  . Small bowel obstruction ([place]) 07/05/2014  . Status post chemotherapy    last chemo 2013  .  Umbilical hernia, incarcerated- surgery 07/10/14 07/05/2014    Past Surgical History:  Procedure Laterality Date  . ABDOMINAL AORTAGRAM  10/20/2013   Procedure: ABDOMINAL Maxcine Ham;  Surgeon: Elam Dutch, MD;  Location: [facility] CATH LAB;  Service: Cardiovascular;;  . ABDOMINAL AORTIC ANEURYSM REPAIR N/A 10/31/2013   Procedure: aorto to left external iliac and right external iliac and left internal iliac, reimplantation of inferior mesenteric artery and ligation of left iliac artery aneursym;  Surgeon: Elam Dutch, MD;  Location: [place];  Service: Vascular;  Laterality: N/A;  . ABDOMINAL WOUND DEHISCENCE N/A 11/10/2013   Procedure: ABDOMINAL WOUND CLOSURE ;  Surgeon: Serafina Mitchell, MD;  Location: [place];  Service: Vascular;  Laterality: N/A;  . bletharoplasty    . BOWEL RESECTION N/A 07/10/2014   Procedure: SMALL BOWEL RESECTION;  Surgeon: [name], MD;  Location: [place];  Service: General;  Laterality: N/A;  . CARDIAC CATHETERIZATION  2003  . CATARACT EXTRACTION, BILATERAL    . COLONOSCOPY W/ BIOPSIES     multiple   . ESOPHAGOGASTRODUODENOSCOPY  2006  . FINGER SURGERY  2008   left pointer finger  . HERNIA REPAIR  70's   x3  . INCISIONAL HERNIA REPAIR N/A 07/10/2014  Procedure: HERNIA REPAIR INCISIONAL;  Surgeon: [name], MD;  Location: [place];  Service: General;  Laterality: N/A;  . KNEE ARTHROSCOPY  2008   left knee  . LAPAROTOMY N/A 07/10/2014   Procedure: EXPLORATORY LAPAROTOMY;  Surgeon: [name], MD;  Location: [place];  Service: General;  Laterality: N/A;  . LOWER EXTREMITY ANGIOGRAM Bilateral 10/20/2013   Procedure: LOWER EXTREMITY ANGIOGRAM;  Surgeon: Elam Dutch, MD;  Location: [facility] CATH LAB;  Service: Cardiovascular;  Laterality: Bilateral;  . LYSIS OF ADHESION N/A 07/10/2014   Procedure: LYSIS OF ADHESION - 90 minutes;  Surgeon: [name], MD;  Location: [place];  Service: General;  Laterality: N/A;  . PORTACATH PLACEMENT  08/10/2011   Procedure:  INSERTION PORT-A-CATH;  Surgeon: Pierre Bali, MD;  Location: [place];  Service: Thoracic;  Laterality: Left;  Left Subclavian  . PTOSIS REPAIR Bilateral 12-02-2015  . THROMBECTOMY ILIAC ARTERY Bilateral 10/31/2013   Procedure: THROMBECTOMY ILIAC ARTERY;  Surgeon: Elam Dutch, MD;  Location: [place];  Service: Vascular;  Laterality: Bilateral;  . TONSILLECTOMY     as a child  . TOTAL KNEE ARTHROPLASTY Left 08/03/2017   Procedure: LEFT TOTAL KNEE ARTHROPLASTY;  Surgeon: [name], MD;  Location: [place];  Service: Orthopedics;  Laterality: Left;  Marland Kitchen VASECTOMY  70's  . VENTRAL HERNIA REPAIR N/A 04/04/2015   Procedure: OPEN REPAIR OF RECURRENT ABDOMINAL WALL HERNIA WITH MESH, COMPLEX LYSIS OF ADHESIONS X1 HR TAR COMPONENT SEPARATION;  Surgeon: Greer Pickerel, MD;  [facility];  Service: General;  Laterality: N/A;    There were no vitals filed for this visit.  Subjective Assessment - 09/23/17 1116    Subjective  Pain at night continues to be intense.    He has been working on the extension mainly.    Currently in Pain?  Yes    Pain Score  3  worse at night    Pain Orientation  Left    Pain Descriptors / Indicators  Aching    Pain Radiating Towards  lateral hip    Pain Frequency  Intermittent    Aggravating Factors   PM,      Pain Relieving Factors  ice,  position change,  sitting and swinging leg    Effect of Pain on Daily Activities  Limits sleep,  ADL's         [facility] PT Assessment - 09/23/17 0001      AROM   Left Knee Extension  -8                  [facility] Adult PT Treatment/Exercise - 09/23/17 0001      Self-Care   Other Self-Care Comments   Multiple questions answered as completely as possible.  referred to MD any medicatipon questions.        Knee/Hip Exercises: Stretches   Sports administrator  3 reps;30 seconds    Hip Flexor Stretch  3 reps;30 seconds    Gastroc Stretch  3 reps;30 seconds incline      Knee/Hip Exercises: Aerobic   Recumbent Bike  5 minutes ,   1 mile,  level2      Knee/Hip Exercises: Standing   Heel Raises  20 reps single    Wall Squat  10 reps HEP    Other Standing Knee Exercises  mini squat at counter ,  HEP 10 x      Knee/Hip Exercises: Supine   Patellar Mobs  yes superior/ inferior glides  Vasopneumatic   Number Minutes Vasopneumatic   15 minutes    Vasopnuematic Location   Knee    Vasopneumatic Pressure  Medium    Vasopneumatic Temperature   34      Manual Therapy   Manual therapy comments  retro massage,  patellar mobs,  roller used distal quads and lateral thigh.  tissue softened.              PT Education - 09/23/17 1338    Education provided  Yes    Education Details  HEP,  Routine at night for a better sleep.     Person(s) Educated  Patient    Methods  Explanation;Demonstration    Comprehension  Verbalized understanding       PT Short Term Goals - 09/23/17 1402      PT SHORT TERM GOAL #1   Title  He will be independent with initial HEp    Baseline  so far independent    Time  3    Period  Weeks    Status  Achieved      PT SHORT TERM GOAL #2   Title  pain with activity decreased >/= 25%    Baseline  at least 25%    Time  3    Period  Weeks    Status  Achieved        PT Long Term Goals - 08/30/17 1204      PT LONG TERM GOAL #1   Title  Pt will improve FOTO score to <50% impaired to demo improvement in functional mobility.     Time  6    Period  Weeks    Status  New    Target Date  10/11/17      PT LONG TERM GOAL #2   Title  He will be independent with all HEP issued    Time  6    Period  Weeks    Status  New    Target Date  10/11/17      PT LONG TERM GOAL #3   Title  Pt will be able to walk as needed in the community without limitation of pain.     Time  6    Period  Weeks    Status  New    Target Date  10/11/17      PT LONG TERM GOAL #4   Title  He will report gradual return to gym to increase fitness and lose weight    Time  6    Period  Weeks    Status  New     Target Date  10/11/17      PT LONG TERM GOAL #5   Title  Pt will demo Lt knee AROM in extension to lacking no more than 5 deg for proper gait mechanics     Time  6    Period  Weeks    Status  New    Target Date  10/11/17            Plan - 09/23/17 1359    Clinical Impression Statement  Patient was full of questions today.  Knee was -8/10 extension with almost 0 PROM.  Mild soreness post session.  He continues to have trouble with pain at night.  STG#2 met.    PT Next Visit Plan  check specific goals, FOTO, gait check , , hip ext strength    PT Home Exercise Plan  issued HEP for knee flexibility, seated HSS,  sidelying ITB stretch, gastroc stretch on stairs, heel raises on step,  wall sit,  mini squat.       Patient will benefit from skilled therapeutic intervention in order to improve the following deficits and impairments:     Visit Diagnosis: Localized edema  Acute pain of left knee  Difficulty in walking, not elsewhere classified     Problem List Patient Active Problem List   Diagnosis Date Noted  . Primary osteoarthritis of left knee 08/03/2017  . S/P total knee replacement using cement, left 08/03/2017  . SBO (small bowel obstruction) ([place]) 03/18/2017  . Elevated d-dimer   . Hypoxia   . SOB (shortness of breath)   . Small bowel obstruction ([place]) 10/20/2016  . AKI (acute kidney injury) ([place]) 10/20/2016  . Acute respiratory failure with hypoxia ([place]) 10/20/2016  . Chronic pain of left knee 07/06/2016  . Port catheter in place 02/27/2016  . Claudication ([place]) 02/18/2016  . Statin intolerance 02/18/2016  . Essential hypertension 02/18/2016  . Coronary artery disease due to lipid rich plaque 08/29/2015  . Acute on chronic diastolic CHF (congestive heart failure), NYHA class 2 ([place]) 08/29/2015  . 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 Hypoxemia requiring supplemental oxygen   . Atrial fibrillation ([place])  07/23/2014  . COPD (chronic obstructive pulmonary disease) ([place]) 07/06/2014  . Hypothyroidism 07/06/2014  . Numbness-Left Thigh 12/14/2013  . AAA- s/p Ao-Iliac BPG March 2015 10/20/2013  . Hyperlipidemia 10/09/2013  . Hepatic cirrhosis ([place]) 12/02/2012  . Cancer of right lung ([place]) 08/06/2011    HARRIS,KAREN  PTA  09/23/2017, 2:05 PM  [place], [place], [address] Phone: [phone]   Fax:  [phone]  Name: Joseph [name] MRN: [id number] Date of Birth: May 31, 1941

## 2017-09-28 ENCOUNTER — Encounter: Payer: Self-pay | Admitting: Physical Therapy

## 2017-09-28 ENCOUNTER — Ambulatory Visit: Payer: PPO | Admitting: Physical Therapy

## 2017-09-28 DIAGNOSIS — R600 Localized edema: Secondary | ICD-10-CM | POA: Diagnosis not present

## 2017-09-28 DIAGNOSIS — R262 Difficulty in walking, not elsewhere classified: Secondary | ICD-10-CM

## 2017-09-28 DIAGNOSIS — M25562 Pain in left knee: Secondary | ICD-10-CM

## 2017-09-28 NOTE — Therapy (Signed)
[place], [place], [address] Phone: [phone]   Fax:  [phone]  Physical Therapy Treatment  Patient Details  Name: Joseph [name] MRN: [id number] Date of Birth: 08/14/1941 Referring Provider: Dr. [name]   Encounter Date: 09/28/2017  PT End of Session - 09/28/17 1134    Visit Number  9    Number of Visits  12    Date for PT Re-Evaluation  10/11/17    PT Start Time  9924    PT Stop Time  1112    PT Time Calculation (min)  57 min    Activity Tolerance  Patient tolerated treatment well    Behavior During Therapy  [facility] for tasks assessed/performed       Past Medical History:  Diagnosis Date  . Acute on chronic diastolic CHF (congestive heart failure), NYHA class 3 ([place]) 07/23/2014  . Arthritis    left knee and back arthrtis  . Atrial fibrillation with rapid ventricular response ([place]) 07/04/2014  . CAD (coronary artery disease)   . Cataract   . Cirrhosis ([place])    By radiography, seen on CT  . Diverticulosis   . Emphysema   . Enlarged prostate   . Headache    when younger but not any longer  . Heart murmur    teen  . History of abdominal aortic aneurysm (AAA)   . History of colon polyps 2006,2008  . History of transfusion 2015  . Hx of radiation therapy 09/02/11 to 10/19/11   chest  . Hx of radiation therapy   . Hyperlipidemia    takes Pravastatin  . Hypothyroidism    takes Synthroid daily  . Inguinal hernia   . Lung cancer ([place])    right upper lobe  . Personal history of adenomatous colonic polyps 07/04/2012   2006, adenomatous right colon polyp removed, then ablated in 2007 followup, 2008, adenomatous polyp of the right colon removed with hot biopsy forceps. All by Dr. Lajoyce Corners 01/03/2013 diminutive polyp ascending colon - adenoma    . Rosacea   . Shortness of breath    with exertion  . Small bowel obstruction ([place]) 07/05/2014  . Status post chemotherapy    last chemo 2013  .  Umbilical hernia, incarcerated- surgery 07/10/14 07/05/2014    Past Surgical History:  Procedure Laterality Date  . ABDOMINAL AORTAGRAM  10/20/2013   Procedure: ABDOMINAL Maxcine Ham;  Surgeon: Elam Dutch, MD;  Location: [facility] CATH LAB;  Service: Cardiovascular;;  . ABDOMINAL AORTIC ANEURYSM REPAIR N/A 10/31/2013   Procedure: aorto to left external iliac and right external iliac and left internal iliac, reimplantation of inferior mesenteric artery and ligation of left iliac artery aneursym;  Surgeon: Elam Dutch, MD;  Location: [place];  Service: Vascular;  Laterality: N/A;  . ABDOMINAL WOUND DEHISCENCE N/A 11/10/2013   Procedure: ABDOMINAL WOUND CLOSURE ;  Surgeon: Serafina Mitchell, MD;  Location: [place];  Service: Vascular;  Laterality: N/A;  . bletharoplasty    . BOWEL RESECTION N/A 07/10/2014   Procedure: SMALL BOWEL RESECTION;  Surgeon: [name], MD;  Location: [place];  Service: General;  Laterality: N/A;  . CARDIAC CATHETERIZATION  2003  . CATARACT EXTRACTION, BILATERAL    . COLONOSCOPY W/ BIOPSIES     multiple   . ESOPHAGOGASTRODUODENOSCOPY  2006  . FINGER SURGERY  2008   left pointer finger  . HERNIA REPAIR  70's   x3  . INCISIONAL HERNIA REPAIR N/A 07/10/2014  Procedure: HERNIA REPAIR INCISIONAL;  Surgeon: [name], MD;  Location: [place];  Service: General;  Laterality: N/A;  . KNEE ARTHROSCOPY  2008   left knee  . LAPAROTOMY N/A 07/10/2014   Procedure: EXPLORATORY LAPAROTOMY;  Surgeon: [name], MD;  Location: [place];  Service: General;  Laterality: N/A;  . LOWER EXTREMITY ANGIOGRAM Bilateral 10/20/2013   Procedure: LOWER EXTREMITY ANGIOGRAM;  Surgeon: Elam Dutch, MD;  Location: [facility] CATH LAB;  Service: Cardiovascular;  Laterality: Bilateral;  . LYSIS OF ADHESION N/A 07/10/2014   Procedure: LYSIS OF ADHESION - 90 minutes;  Surgeon: [name], MD;  Location: [place];  Service: General;  Laterality: N/A;  . PORTACATH PLACEMENT  08/10/2011   Procedure:  INSERTION PORT-A-CATH;  Surgeon: Pierre Bali, MD;  Location: [place];  Service: Thoracic;  Laterality: Left;  Left Subclavian  . PTOSIS REPAIR Bilateral 12-02-2015  . THROMBECTOMY ILIAC ARTERY Bilateral 10/31/2013   Procedure: THROMBECTOMY ILIAC ARTERY;  Surgeon: Elam Dutch, MD;  Location: [place];  Service: Vascular;  Laterality: Bilateral;  . TONSILLECTOMY     as a child  . TOTAL KNEE ARTHROPLASTY Left 08/03/2017   Procedure: LEFT TOTAL KNEE ARTHROPLASTY;  Surgeon: [name], MD;  Location: [place];  Service: Orthopedics;  Laterality: Left;  Marland Kitchen VASECTOMY  70's  . VENTRAL HERNIA REPAIR N/A 04/04/2015   Procedure: OPEN REPAIR OF RECURRENT ABDOMINAL WALL HERNIA WITH MESH, COMPLEX LYSIS OF ADHESIONS X1 HR TAR COMPONENT SEPARATION;  Surgeon: Greer Pickerel, MD;  [facility];  Service: General;  Laterality: N/A;    There were no vitals filed for this visit.  Subjective Assessment - 09/28/17 1021    Subjective  Pain was so bad last night.  Right now none at all, maybe a 1/10 when I start biking.     Currently in Pain?  No/denies         [facility] PT Assessment - 09/28/17 0001      Assessment   Next MD Visit  10/04/17      Observation/Other Assessments   Focus on Therapeutic Outcomes (FOTO)   50%      AROM   Left Knee Extension  -8    Left Knee Flexion  120 125 with overpressure                   [facility] Adult PT Treatment/Exercise - 09/28/17 0001      Knee/Hip Exercises: Stretches   Active Hamstring Stretch  Left;3 reps    Hip Flexor Stretch  Left;1 rep    Knee: Self-Stretch to increase Flexion  Left;3 reps      Knee/Hip Exercises: Aerobic   Stationary Bike  5 min L 3 full revolutions       Knee/Hip Exercises: Standing   Lateral Step Up  Left;1 set;15 reps;Hand Hold: 1;Step Height: 6"    Forward Step Up  Left;1 set;15 reps;Hand Hold: 1;Step Height: 6"    Wall Squat  1 set;15 reps    Wall Squat Limitations  cues for heels into floor    Other Standing Knee  Exercises  weight shifts x 10, mod cues       Knee/Hip Exercises: Supine   Quad Sets  Strengthening;Left;1 set    [employer]  Strengthening;Both;1 set;10 reps    Single Leg Bridge  Strengthening;Left;1 set;10 reps    Straight Leg Raises  Strengthening;Both;1 set;10 reps      Vasopneumatic   Number Minutes Vasopneumatic   15  minutes    Vasopnuematic Location   Knee    Vasopneumatic Pressure  Medium    Vasopneumatic Temperature   34      Manual Therapy   Soft tissue mobilization  quads, ITB IASTM  in hip and knee extension off table              PT Education - 09/28/17 1134    Education provided  Yes    Education Details  progress with ROM     Person(s) Educated  Patient    Methods  Explanation    Comprehension  Verbalized understanding       PT Short Term Goals - 09/23/17 1402      PT SHORT TERM GOAL #1   Title  He will be independent with initial HEp    Baseline  so far independent    Time  3    Period  Weeks    Status  Achieved      PT SHORT TERM GOAL #2   Title  pain with activity decreased >/= 25%    Baseline  at least 25%    Time  3    Period  Weeks    Status  Achieved        PT Long Term Goals - 09/28/17 1036      PT LONG TERM GOAL #1   Title  Pt will improve FOTO score to <50% impaired to demo improvement in functional mobility.     Baseline  50%    Status  Achieved      PT LONG TERM GOAL #2   Title  He will be independent with all HEP issued    Status  On-going      PT LONG TERM GOAL #3   Title  Pt will be able to walk as needed in the community without limitation of pain.     Baseline  long distances will get sore, 600 feet is as long as he can go at this point     Status  On-going      PT LONG TERM GOAL #4   Title  He will report gradual return to gym to increase fitness and lose weight    Status  On-going      PT LONG TERM GOAL #5   Title  Pt will demo Lt knee AROM in extension to lacking no more than 5 deg for proper gait mechanics      Baseline  8 deg AROM    Status  On-going            Plan - 09/28/17 1129    Clinical Impression Statement  FOTO goal was met.  He is doing well except for nighttime pain.  AROM improved.  Lacks full extension 8 deg still.  Swelling improved as well.  He is still not able to walk very far before pain increases (600 feet per pt).  and is not really pushing that.  He has not gone to the gym but would like to work towards that.  He sees MD 2/4 and may continue PT until then.     PT Treatment/Interventions  ADLs/Self Care Home Management;Patient/family education;Passive range of motion;Therapeutic activities;Moist Heat;Cryotherapy;Electrical Stimulation;Gait training;Neuromuscular re-education;Manual techniques;Taping;Manual lymph drainage;Vasopneumatic Device;Therapeutic exercise;Balance training    PT Next Visit Plan  gait, tolerance for walking, endurance , ROM as strength, seated mobs?     PT Home Exercise Plan  issued HEP for knee flexibility, seated HSS, sidelying ITB stretch, gastroc stretch on  stairs, heel raises on step,  wall sit,  mini squat.    Consulted and Agree with Plan of Care  Patient       Patient will benefit from skilled therapeutic intervention in order to improve the following deficits and impairments:  Decreased range of motion, Difficulty walking, Increased fascial restricitons, Pain, Impaired flexibility, Hypomobility, Decreased balance, Decreased mobility, Decreased strength, Increased edema  Visit Diagnosis: Localized edema  Acute pain of left knee  Difficulty in walking, not elsewhere classified     Problem List Patient Active Problem List   Diagnosis Date Noted  . Primary osteoarthritis of left knee 08/03/2017  . S/P total knee replacement using cement, left 08/03/2017  . SBO (small bowel obstruction) ([place]) 03/18/2017  . Elevated d-dimer   . Hypoxia   . SOB (shortness of breath)   . Small bowel obstruction ([place]) 10/20/2016  . AKI (acute kidney  injury) ([place]) 10/20/2016  . Acute respiratory failure with hypoxia ([place]) 10/20/2016  . Chronic pain of left knee 07/06/2016  . Port catheter in place 02/27/2016  . Claudication ([place]) 02/18/2016  . Statin intolerance 02/18/2016  . Essential hypertension 02/18/2016  . Coronary artery disease due to lipid rich plaque 08/29/2015  . Acute on chronic diastolic CHF (congestive heart failure), NYHA class 2 ([place]) 08/29/2015  . 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 Hypoxemia requiring supplemental oxygen   . Atrial fibrillation ([place]) 07/23/2014  . COPD (chronic obstructive pulmonary disease) ([place]) 07/06/2014  . Hypothyroidism 07/06/2014  . Numbness-Left Thigh 12/14/2013  . AAA- s/p Ao-Iliac BPG March 2015 10/20/2013  . Hyperlipidemia 10/09/2013  . Hepatic cirrhosis ([place]) 12/02/2012  . Cancer of right lung [facility]) 08/06/2011    PAA,JENNIFER 09/28/2017, 11:35 AM  [place], [place], [address] Phone: [phone]   Fax:  [phone]  Name: Joseph [name] MRN: [id number] Date of Birth: 1940/12/26   Raeford Razor, PT 09/28/17 11:35 AM Phone: [phone] Fax: [phone]

## 2017-09-30 ENCOUNTER — Encounter: Payer: Self-pay | Admitting: Physical Therapy

## 2017-09-30 ENCOUNTER — Ambulatory Visit: Payer: PPO | Admitting: Physical Therapy

## 2017-09-30 DIAGNOSIS — M545 Low back pain: Secondary | ICD-10-CM

## 2017-09-30 DIAGNOSIS — R262 Difficulty in walking, not elsewhere classified: Secondary | ICD-10-CM

## 2017-09-30 DIAGNOSIS — R293 Abnormal posture: Secondary | ICD-10-CM

## 2017-09-30 DIAGNOSIS — G8929 Other chronic pain: Secondary | ICD-10-CM

## 2017-09-30 DIAGNOSIS — R600 Localized edema: Secondary | ICD-10-CM

## 2017-09-30 DIAGNOSIS — M25562 Pain in left knee: Secondary | ICD-10-CM

## 2017-09-30 NOTE — Therapy (Signed)
[place], [place], [address] Phone: [phone]   Fax:  [phone]  Physical Therapy Treatment  Patient Details  Name: Joseph [name] MRN: [id number] Date of Birth: Jun 10, 1941 Referring Provider: Dr. [name]   Encounter Date: 09/30/2017  PT End of Session - 09/30/17 1446    Visit Number  10    Date for PT Re-Evaluation  10/11/17    PT Start Time  1450    PT Stop Time  1549    PT Time Calculation (min)  59 min    Activity Tolerance  Patient tolerated treatment well    Behavior During Therapy  [facility] for tasks assessed/performed       Past Medical History:  Diagnosis Date  . Acute on chronic diastolic CHF (congestive heart failure), NYHA class 3 ([place]) 07/23/2014  . Arthritis    left knee and back arthrtis  . Atrial fibrillation with rapid ventricular response ([place]) 07/04/2014  . CAD (coronary artery disease)   . Cataract   . Cirrhosis ([place])    By radiography, seen on CT  . Diverticulosis   . Emphysema   . Enlarged prostate   . Headache    when younger but not any longer  . Heart murmur    teen  . History of abdominal aortic aneurysm (AAA)   . History of colon polyps 2006,2008  . History of transfusion 2015  . Hx of radiation therapy 09/02/11 to 10/19/11   chest  . Hx of radiation therapy   . Hyperlipidemia    takes Pravastatin  . Hypothyroidism    takes Synthroid daily  . Inguinal hernia   . Lung cancer ([place])    right upper lobe  . Personal history of adenomatous colonic polyps 07/04/2012   2006, adenomatous right colon polyp removed, then ablated in 2007 followup, 2008, adenomatous polyp of the right colon removed with hot biopsy forceps. All by Dr. Lajoyce Corners 01/03/2013 diminutive polyp ascending colon - adenoma    . Rosacea   . Shortness of breath    with exertion  . Small bowel obstruction ([place]) 07/05/2014  . Status post chemotherapy    last chemo 2013  . Umbilical hernia,  incarcerated- surgery 07/10/14 07/05/2014    Past Surgical History:  Procedure Laterality Date  . ABDOMINAL AORTAGRAM  10/20/2013   Procedure: ABDOMINAL Maxcine Ham;  Surgeon: Elam Dutch, MD;  Location: [facility] CATH LAB;  Service: Cardiovascular;;  . ABDOMINAL AORTIC ANEURYSM REPAIR N/A 10/31/2013   Procedure: aorto to left external iliac and right external iliac and left internal iliac, reimplantation of inferior mesenteric artery and ligation of left iliac artery aneursym;  Surgeon: Elam Dutch, MD;  Location: [place];  Service: Vascular;  Laterality: N/A;  . ABDOMINAL WOUND DEHISCENCE N/A 11/10/2013   Procedure: ABDOMINAL WOUND CLOSURE ;  Surgeon: Serafina Mitchell, MD;  Location: [place];  Service: Vascular;  Laterality: N/A;  . bletharoplasty    . BOWEL RESECTION N/A 07/10/2014   Procedure: SMALL BOWEL RESECTION;  Surgeon: [name], MD;  Location: [place];  Service: General;  Laterality: N/A;  . CARDIAC CATHETERIZATION  2003  . CATARACT EXTRACTION, BILATERAL    . COLONOSCOPY W/ BIOPSIES     multiple   . ESOPHAGOGASTRODUODENOSCOPY  2006  . FINGER SURGERY  2008   left pointer finger  . HERNIA REPAIR  70's   x3  . INCISIONAL HERNIA REPAIR N/A 07/10/2014   Procedure: HERNIA REPAIR INCISIONAL;  Surgeon: Randall Hiss  Ronnie Derby, MD;  Location: [place];  Service: General;  Laterality: N/A;  . KNEE ARTHROSCOPY  2008   left knee  . LAPAROTOMY N/A 07/10/2014   Procedure: EXPLORATORY LAPAROTOMY;  Surgeon: [name], MD;  Location: [place];  Service: General;  Laterality: N/A;  . LOWER EXTREMITY ANGIOGRAM Bilateral 10/20/2013   Procedure: LOWER EXTREMITY ANGIOGRAM;  Surgeon: Elam Dutch, MD;  Location: [facility] CATH LAB;  Service: Cardiovascular;  Laterality: Bilateral;  . LYSIS OF ADHESION N/A 07/10/2014   Procedure: LYSIS OF ADHESION - 90 minutes;  Surgeon: [name], MD;  Location: [place];  Service: General;  Laterality: N/A;  . PORTACATH PLACEMENT  08/10/2011   Procedure: INSERTION PORT-A-CATH;   Surgeon: Pierre Bali, MD;  Location: [place];  Service: Thoracic;  Laterality: Left;  Left Subclavian  . PTOSIS REPAIR Bilateral 12-02-2015  . THROMBECTOMY ILIAC ARTERY Bilateral 10/31/2013   Procedure: THROMBECTOMY ILIAC ARTERY;  Surgeon: Elam Dutch, MD;  Location: [place];  Service: Vascular;  Laterality: Bilateral;  . TONSILLECTOMY     as a child  . TOTAL KNEE ARTHROPLASTY Left 08/03/2017   Procedure: LEFT TOTAL KNEE ARTHROPLASTY;  Surgeon: [name], MD;  Location: [place];  Service: Orthopedics;  Laterality: Left;  Marland Kitchen VASECTOMY  70's  . VENTRAL HERNIA REPAIR N/A 04/04/2015   Procedure: OPEN REPAIR OF RECURRENT ABDOMINAL WALL HERNIA WITH MESH, COMPLEX LYSIS OF ADHESIONS X1 HR TAR COMPONENT SEPARATION;  Surgeon: Greer Pickerel, MD;  [facility];  Service: General;  Laterality: N/A;    There were no vitals filed for this visit.     [place] Adult PT Treatment/Exercise - 09/30/17 0001      Knee/Hip Exercises: Aerobic   Stationary Bike  5 min L 3 full revolutions       Knee/Hip Exercises: Standing   Hip Abduction  Stengthening;Both;1 set;15 reps;Knee straight    Hip Extension  Stengthening;Both;1 set;15 reps    Functional Squat  2 sets;10 reps good form       Knee/Hip Exercises: Sidelying   Hip ABduction  Strengthening;Both;1 set;20 reps    Other Sidelying Knee/Hip Exercises  sidekick series for enduarnce, hip abd       Knee/Hip Exercises: Prone   Hip Extension  Strengthening;Both;1 set    Other Prone Exercises  prone hip ext knee bent x 10 each , cues for core       Vasopneumatic   Number Minutes Vasopneumatic   15 minutes    Vasopnuematic Location   Knee    Vasopneumatic Pressure  Medium    Vasopneumatic Temperature   34      Manual Therapy   Joint Mobilization  Gr II with strap seated for extension              PT Education - 09/30/17 1526    Education provided  Yes    Education Details  hip strengthening     Person(s) Educated  Patient    Methods   Explanation    Comprehension  Verbalized understanding       PT Short Term Goals - 09/23/17 1402      PT SHORT TERM GOAL #1   Title  He will be independent with initial HEp    Baseline  so far independent    Time  3    Period  Weeks    Status  Achieved      PT SHORT TERM GOAL #2   Title  pain with activity  decreased >/= 25%    Baseline  at least 25%    Time  3    Period  Weeks    Status  Achieved        PT Long Term Goals - 09/28/17 1036      PT LONG TERM GOAL #1   Title  Pt will improve FOTO score to <50% impaired to demo improvement in functional mobility.     Baseline  50%    Status  Achieved      PT LONG TERM GOAL #2   Title  He will be independent with all HEP issued    Status  On-going      PT LONG TERM GOAL #3   Title  Pt will be able to walk as needed in the community without limitation of pain.     Baseline  long distances will get sore, 600 feet is as long as he can go at this point     Status  On-going      PT LONG TERM GOAL #4   Title  He will report gradual return to gym to increase fitness and lose weight    Status  On-going      PT LONG TERM GOAL #5   Title  Pt will demo Lt knee AROM in extension to lacking no more than 5 deg for proper gait mechanics     Baseline  8 deg AROM    Status  On-going            Plan - 09/30/17 1519    Clinical Impression Statement  Pt was able to go to the gym but with increase in knee pain, back pain.  Knee did not bother him much during biking.  Biked for 20 min, Advils helped.  Did some upper body work too.  Extension measured end of session at  lacking 5 deg.      PT Treatment/Interventions  ADLs/Self Care Home Management;Patient/family education;Passive range of motion;Therapeutic activities;Moist Heat;Cryotherapy;Electrical Stimulation;Gait training;Neuromuscular re-education;Manual techniques;Taping;Manual lymph drainage;Vasopneumatic Device;Therapeutic exercise;Balance training    PT Next Visit Plan  gait,  tolerance for walking, endurance , ROM, strength in core and hips , seated mobs? DGI?     PT Home Exercise Plan  issued HEP for knee flexibility, seated HSS, sidelying ITB stretch, gastroc stretch on stairs, heel raises on step,  wall sit,  mini squat.    Consulted and Agree with Plan of Care  Patient       Patient will benefit from skilled therapeutic intervention in order to improve the following deficits and impairments:  Decreased range of motion, Difficulty walking, Increased fascial restricitons, Pain, Impaired flexibility, Hypomobility, Decreased balance, Decreased mobility, Decreased strength, Increased edema  Visit Diagnosis: Localized edema  Acute pain of left knee  Difficulty in walking, not elsewhere classified  Chronic low back pain, unspecified back pain laterality, with sciatica presence unspecified  Abnormal posture     Problem List Patient Active Problem List   Diagnosis Date Noted  . Primary osteoarthritis of left knee 08/03/2017  . S/P total knee replacement using cement, left 08/03/2017  . SBO (small bowel obstruction) ([place]) 03/18/2017  . Elevated d-dimer   . Hypoxia   . SOB (shortness of breath)   . Small bowel obstruction ([place]) 10/20/2016  . AKI (acute kidney injury) ([place]) 10/20/2016  . Acute respiratory failure with hypoxia ([place]) 10/20/2016  . Chronic pain of left knee 07/06/2016  . Port catheter in place 02/27/2016  . Claudication ([place]) 02/18/2016  .  Statin intolerance 02/18/2016  . Essential hypertension 02/18/2016  . Coronary artery disease due to lipid rich plaque 08/29/2015  . Acute on chronic diastolic CHF (congestive heart failure), NYHA class 2 ([place]) 08/29/2015  . 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 Hypoxemia requiring supplemental oxygen   . Atrial fibrillation ([place]) 07/23/2014  . COPD (chronic obstructive pulmonary disease) ([place]) 07/06/2014  . Hypothyroidism 07/06/2014  . Numbness-Left  Thigh 12/14/2013  . AAA- s/p Ao-Iliac BPG March 2015 10/20/2013  . Hyperlipidemia 10/09/2013  . Hepatic cirrhosis ([place]) 12/02/2012  . Cancer of right lung [facility]) 08/06/2011    Joseph [name] 09/30/2017, 4:29 PM  [facility] [address] [place], [place], [address] Phone: [phone]   Fax:  [phone]  Name: Joseph [name] MRN: [id number] Date of Birth: 1941-08-08  Raeford Razor, PT 09/30/17 4:29 PM Phone: [phone] Fax: [phone]

## 2017-10-01 ENCOUNTER — Ambulatory Visit (INDEPENDENT_AMBULATORY_CARE_PROVIDER_SITE_OTHER): Payer: PPO | Admitting: Orthopaedic Surgery

## 2017-10-04 ENCOUNTER — Ambulatory Visit (INDEPENDENT_AMBULATORY_CARE_PROVIDER_SITE_OTHER): Payer: PPO | Admitting: Orthopaedic Surgery

## 2017-10-04 ENCOUNTER — Encounter (INDEPENDENT_AMBULATORY_CARE_PROVIDER_SITE_OTHER): Payer: Self-pay | Admitting: Orthopaedic Surgery

## 2017-10-04 VITALS — BP 119/70 | HR 97 | Resp 12 | Ht 72.0 in | Wt 220.0 lb

## 2017-10-04 DIAGNOSIS — Z96652 Presence of left artificial knee joint: Secondary | ICD-10-CM

## 2017-10-04 MED ORDER — OXYCODONE HCL 5 MG PO TABS: 5.0000 mg | ORAL_TABLET | Freq: Three times a day (TID) | ORAL | 0 refills | Status: DC | PRN

## 2017-10-04 NOTE — Progress Notes (Signed)
Office Visit Note   Patient: Joseph [name]           Date of Birth: [birth year]           MRN: [id number] Visit Date: 10/04/2017              Requested by: [name], MD [address] [place], [place] [address] PCP: [name], MD   Assessment & Plan: Visit Diagnoses:  1. History of left knee replacement     Plan: 2 months status post left total knee replacement and doing quite well. No fever or chills. Good effort in physical therapy. Encouraged continued exercises and we'll see in 3 months  Follow-Up Instructions: Return in about 3 months (around 01/01/2018).   Orders:  No orders of the defined types were placed in this encounter.  Meds ordered this encounter  Medications  . oxyCODONE (OXY IR/ROXICODONE) 5 MG immediate release tablet    Sig: Take 1 tablet (5 mg total) by mouth every 8 (eight) hours as needed for moderate pain or severe pain ((score 4 to 6)).    Dispense:  30 tablet    Refill:  0      Procedures: No procedures performed   Clinical Data: No additional findings.   Subjective: Chief Complaint  Patient presents with  . Left Knee - Routine Post Op, Edema, Pain    Joseph [name] is a 77 y o S/P approx. 2 mo L TKA. He relates he has pain,swelling and sometimes "pops". He is in PT and goes to [facility] to ride a bike.    HPI  Review of Systems  Constitutional: Negative for fatigue.  HENT: Negative for hearing loss.   Respiratory: Negative for apnea, chest tightness and shortness of breath.   Cardiovascular: Negative for chest pain, palpitations and leg swelling.  Gastrointestinal: Negative for blood in stool, constipation and diarrhea.  Genitourinary: Negative for difficulty urinating.  Musculoskeletal: Positive for back pain. Negative for arthralgias, joint swelling, myalgias, neck pain and neck stiffness.  Neurological: Positive for weakness. Negative for numbness and headaches.  Hematological: Does not bruise/bleed easily.    Psychiatric/Behavioral: Negative for sleep disturbance. The patient is not nervous/anxious.      Objective: Vital Signs: BP 119/70   Pulse 97   Resp 12   Ht 6' (1.829 m)   Wt 220 lb (99.8 kg)   BMI 29.84 kg/m   Physical Exam  Ortho Exam left knee without effusion. Just about full extension and flexion over 105. No instability. No calf pain or swelling. No localized areas of tenderness. Does have thigh atrophy of about an inch compared to the right side  Specialty Comments:  No specialty comments available.  Imaging: No results found.   PMFS History: Patient Active Problem List   Diagnosis Date Noted  . Primary osteoarthritis of left knee 08/03/2017  . S/P total knee replacement using cement, left 08/03/2017  . SBO (small bowel obstruction) ([place]) 03/18/2017  . Elevated d-dimer   . Hypoxia   . SOB (shortness of breath)   . Small bowel obstruction ([place]) 10/20/2016  . AKI (acute kidney injury) ([place]) 10/20/2016  . Acute respiratory failure with hypoxia ([place]) 10/20/2016  . Chronic pain of left knee 07/06/2016  . Port catheter in place 02/27/2016  . Claudication ([place]) 02/18/2016  . Statin intolerance 02/18/2016  . Essential hypertension 02/18/2016  . Coronary artery disease due to lipid rich plaque 08/29/2015  . Acute on chronic diastolic CHF (congestive heart failure), NYHA class 2 (  [place]) 08/29/2015  . 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 Hypoxemia requiring supplemental oxygen   . Atrial fibrillation ([place]) 07/23/2014  . COPD (chronic obstructive pulmonary disease) ([place]) 07/06/2014  . Hypothyroidism 07/06/2014  . Numbness-Left Thigh 12/14/2013  . AAA- s/p Ao-Iliac BPG March 2015 10/20/2013  . Hyperlipidemia 10/09/2013  . Hepatic cirrhosis ([place]) 12/02/2012  . Cancer of right lung ([place]) 08/06/2011   Past Medical History:  Diagnosis Date  . Acute on chronic diastolic CHF (congestive heart failure), NYHA class 3  ([place]) 07/23/2014  . Arthritis    left knee and back arthrtis  . Atrial fibrillation with rapid ventricular response ([place]) 07/04/2014  . CAD (coronary artery disease)   . Cataract   . Cirrhosis ([place])    By radiography, seen on CT  . Diverticulosis   . Emphysema   . Enlarged prostate   . Headache    when younger but not any longer  . Heart murmur    teen  . History of abdominal aortic aneurysm (AAA)   . History of colon polyps 2006,2008  . History of transfusion 2015  . Hx of radiation therapy 09/02/11 to 10/19/11   chest  . Hx of radiation therapy   . Hyperlipidemia    takes Pravastatin  . Hypothyroidism    takes Synthroid daily  . Inguinal hernia   . Lung cancer ([place])    right upper lobe  . Personal history of adenomatous colonic polyps 07/04/2012   2006, adenomatous right colon polyp removed, then ablated in 2007 followup, 2008, adenomatous polyp of the right colon removed with hot biopsy forceps. All by Dr. Lajoyce Corners 01/03/2013 diminutive polyp ascending colon - adenoma    . Rosacea   . Shortness of breath    with exertion  . Small bowel obstruction ([place]) 07/05/2014  . Status post chemotherapy    last chemo 2013  . Umbilical hernia, incarcerated- surgery 07/10/14 07/05/2014    Family History  Problem Relation Age of Onset  . Cancer Father   . Diabetes Father   . Hyperlipidemia Mother   . Hypertension Mother   . Hyperlipidemia Brother   . AAA (abdominal aortic aneurysm) Brother   . Hypertension Brother   . Hyperlipidemia Sister   . Hypertension Sister   . Anesthesia problems Neg Hx   . Hypotension Neg Hx   . Malignant hyperthermia Neg Hx   . Pseudochol deficiency Neg Hx   . Colon cancer Neg Hx     Past Surgical History:  Procedure Laterality Date  . ABDOMINAL AORTAGRAM  10/20/2013   Procedure: ABDOMINAL Maxcine Ham;  Surgeon: Elam Dutch, MD;  Location: [facility] CATH LAB;  Service: Cardiovascular;;  . ABDOMINAL AORTIC ANEURYSM REPAIR N/A 10/31/2013   Procedure: aorto to left  external iliac and right external iliac and left internal iliac, reimplantation of inferior mesenteric artery and ligation of left iliac artery aneursym;  Surgeon: Elam Dutch, MD;  Location: [place];  Service: Vascular;  Laterality: N/A;  . ABDOMINAL WOUND DEHISCENCE N/A 11/10/2013   Procedure: ABDOMINAL WOUND CLOSURE ;  Surgeon: Serafina Mitchell, MD;  Location: [place];  Service: Vascular;  Laterality: N/A;  . bletharoplasty    . BOWEL RESECTION N/A 07/10/2014   Procedure: SMALL BOWEL RESECTION;  Surgeon: [name], MD;  Location: [place];  Service: General;  Laterality: N/A;  . CARDIAC CATHETERIZATION  2003  . CATARACT EXTRACTION, BILATERAL    . COLONOSCOPY W/ BIOPSIES  multiple   . ESOPHAGOGASTRODUODENOSCOPY  2006  . FINGER SURGERY  2008   left pointer finger  . HERNIA REPAIR  70's   x3  . INCISIONAL HERNIA REPAIR N/A 07/10/2014   Procedure: HERNIA REPAIR INCISIONAL;  Surgeon: [name], MD;  Location: [place];  Service: General;  Laterality: N/A;  . KNEE ARTHROSCOPY  2008   left knee  . LAPAROTOMY N/A 07/10/2014   Procedure: EXPLORATORY LAPAROTOMY;  Surgeon: [name], MD;  Location: [place];  Service: General;  Laterality: N/A;  . LOWER EXTREMITY ANGIOGRAM Bilateral 10/20/2013   Procedure: LOWER EXTREMITY ANGIOGRAM;  Surgeon: Elam Dutch, MD;  Location: [facility] CATH LAB;  Service: Cardiovascular;  Laterality: Bilateral;  . LYSIS OF ADHESION N/A 07/10/2014   Procedure: LYSIS OF ADHESION - 90 minutes;  Surgeon: [name], MD;  Location: [place];  Service: General;  Laterality: N/A;  . PORTACATH PLACEMENT  08/10/2011   Procedure: INSERTION PORT-A-CATH;  Surgeon: Pierre Bali, MD;  Location: [place];  Service: Thoracic;  Laterality: Left;  Left Subclavian  . PTOSIS REPAIR Bilateral 12-02-2015  . THROMBECTOMY ILIAC ARTERY Bilateral 10/31/2013   Procedure: THROMBECTOMY ILIAC ARTERY;  Surgeon: Elam Dutch, MD;  Location: [place];  Service: Vascular;  Laterality: Bilateral;  .  TONSILLECTOMY     as a child  . TOTAL KNEE ARTHROPLASTY Left 08/03/2017   Procedure: LEFT TOTAL KNEE ARTHROPLASTY;  Surgeon: [name], MD;  Location: [place];  Service: Orthopedics;  Laterality: Left;  Marland Kitchen VASECTOMY  70's  . VENTRAL HERNIA REPAIR N/A 04/04/2015   Procedure: OPEN REPAIR OF RECURRENT ABDOMINAL WALL HERNIA WITH MESH, COMPLEX LYSIS OF ADHESIONS X1 HR TAR COMPONENT SEPARATION;  Surgeon: Greer Pickerel, MD;  Location: [facility];  Service: General;  Laterality: N/A;   Social History   Occupational History  . Not on file  Tobacco Use  . Smoking status: Former Smoker    Packs/day: 1.50    Years: 40.00    Pack years: 60.00    Types: Cigarettes    Last attempt to quit: 07/02/2011    Years since quitting: 6.2  . Smokeless tobacco: Former Network engineer and Sexual Activity  . Alcohol use: Yes    Comment: social - 2 MONTHLY  . Drug use: No  . Sexual activity: No

## 2017-10-07 ENCOUNTER — Encounter: Payer: Self-pay | Admitting: Physical Therapy

## 2017-10-07 ENCOUNTER — Ambulatory Visit: Payer: PPO | Attending: Orthopaedic Surgery | Admitting: Physical Therapy

## 2017-10-07 DIAGNOSIS — R600 Localized edema: Secondary | ICD-10-CM | POA: Diagnosis not present

## 2017-10-07 DIAGNOSIS — M62830 Muscle spasm of back: Secondary | ICD-10-CM | POA: Diagnosis not present

## 2017-10-07 DIAGNOSIS — M25562 Pain in left knee: Secondary | ICD-10-CM

## 2017-10-07 DIAGNOSIS — G8929 Other chronic pain: Secondary | ICD-10-CM | POA: Diagnosis not present

## 2017-10-07 DIAGNOSIS — R262 Difficulty in walking, not elsewhere classified: Secondary | ICD-10-CM | POA: Diagnosis not present

## 2017-10-07 DIAGNOSIS — R293 Abnormal posture: Secondary | ICD-10-CM | POA: Diagnosis not present

## 2017-10-07 DIAGNOSIS — M545 Low back pain: Secondary | ICD-10-CM | POA: Diagnosis not present

## 2017-10-07 NOTE — Therapy (Signed)
[place], [place], [address] Phone: [phone]   Fax:  [phone]  Physical Therapy Treatment  Patient Details  Name: Joseph [name] MRN: [id number] Date of Birth: 13-Oct-1940 Referring Provider: Dr. [name]   Encounter Date: 10/07/2017  PT End of Session - 10/07/17 1546    Visit Number  11    Number of Visits  12    Date for PT Re-Evaluation  10/11/17    PT Start Time  1500    PT Stop Time  1555    PT Time Calculation (min)  55 min    Activity Tolerance  Patient tolerated treatment well    Behavior During Therapy  [facility] for tasks assessed/performed       Past Medical History:  Diagnosis Date  . Acute on chronic diastolic CHF (congestive heart failure), NYHA class 3 ([place]) 07/23/2014  . Arthritis    left knee and back arthrtis  . Atrial fibrillation with rapid ventricular response ([place]) 07/04/2014  . CAD (coronary artery disease)   . Cataract   . Cirrhosis ([place])    By radiography, seen on CT  . Diverticulosis   . Emphysema   . Enlarged prostate   . Headache    when younger but not any longer  . Heart murmur    teen  . History of abdominal aortic aneurysm (AAA)   . History of colon polyps 2006,2008  . History of transfusion 2015  . Hx of radiation therapy 09/02/11 to 10/19/11   chest  . Hx of radiation therapy   . Hyperlipidemia    takes Pravastatin  . Hypothyroidism    takes Synthroid daily  . Inguinal hernia   . Lung cancer ([place])    right upper lobe  . Personal history of adenomatous colonic polyps 07/04/2012   2006, adenomatous right colon polyp removed, then ablated in 2007 followup, 2008, adenomatous polyp of the right colon removed with hot biopsy forceps. All by Dr. Lajoyce Corners 01/03/2013 diminutive polyp ascending colon - adenoma    . Rosacea   . Shortness of breath    with exertion  . Small bowel obstruction ([place]) 07/05/2014  . Status post chemotherapy    last chemo 2013  .  Umbilical hernia, incarcerated- surgery 07/10/14 07/05/2014    Past Surgical History:  Procedure Laterality Date  . ABDOMINAL AORTAGRAM  10/20/2013   Procedure: ABDOMINAL Maxcine Ham;  Surgeon: Elam Dutch, MD;  Location: [facility] CATH LAB;  Service: Cardiovascular;;  . ABDOMINAL AORTIC ANEURYSM REPAIR N/A 10/31/2013   Procedure: aorto to left external iliac and right external iliac and left internal iliac, reimplantation of inferior mesenteric artery and ligation of left iliac artery aneursym;  Surgeon: Elam Dutch, MD;  Location: [place];  Service: Vascular;  Laterality: N/A;  . ABDOMINAL WOUND DEHISCENCE N/A 11/10/2013   Procedure: ABDOMINAL WOUND CLOSURE ;  Surgeon: Serafina Mitchell, MD;  Location: [place];  Service: Vascular;  Laterality: N/A;  . bletharoplasty    . BOWEL RESECTION N/A 07/10/2014   Procedure: SMALL BOWEL RESECTION;  Surgeon: [name], MD;  Location: [place];  Service: General;  Laterality: N/A;  . CARDIAC CATHETERIZATION  2003  . CATARACT EXTRACTION, BILATERAL    . COLONOSCOPY W/ BIOPSIES     multiple   . ESOPHAGOGASTRODUODENOSCOPY  2006  . FINGER SURGERY  2008   left pointer finger  . HERNIA REPAIR  70's   x3  . INCISIONAL HERNIA REPAIR N/A 07/10/2014  Procedure: HERNIA REPAIR INCISIONAL;  Surgeon: [name], MD;  Location: [place];  Service: General;  Laterality: N/A;  . KNEE ARTHROSCOPY  2008   left knee  . LAPAROTOMY N/A 07/10/2014   Procedure: EXPLORATORY LAPAROTOMY;  Surgeon: [name], MD;  Location: [place];  Service: General;  Laterality: N/A;  . LOWER EXTREMITY ANGIOGRAM Bilateral 10/20/2013   Procedure: LOWER EXTREMITY ANGIOGRAM;  Surgeon: Elam Dutch, MD;  Location: [facility] CATH LAB;  Service: Cardiovascular;  Laterality: Bilateral;  . LYSIS OF ADHESION N/A 07/10/2014   Procedure: LYSIS OF ADHESION - 90 minutes;  Surgeon: [name], MD;  Location: [place];  Service: General;  Laterality: N/A;  . PORTACATH PLACEMENT  08/10/2011   Procedure:  INSERTION PORT-A-CATH;  Surgeon: Pierre Bali, MD;  Location: [place];  Service: Thoracic;  Laterality: Left;  Left Subclavian  . PTOSIS REPAIR Bilateral 12-02-2015  . THROMBECTOMY ILIAC ARTERY Bilateral 10/31/2013   Procedure: THROMBECTOMY ILIAC ARTERY;  Surgeon: Elam Dutch, MD;  Location: [place];  Service: Vascular;  Laterality: Bilateral;  . TONSILLECTOMY     as a child  . TOTAL KNEE ARTHROPLASTY Left 08/03/2017   Procedure: LEFT TOTAL KNEE ARTHROPLASTY;  Surgeon: [name], MD;  Location: [place];  Service: Orthopedics;  Laterality: Left;  Marland Kitchen VASECTOMY  70's  . VENTRAL HERNIA REPAIR N/A 04/04/2015   Procedure: OPEN REPAIR OF RECURRENT ABDOMINAL WALL HERNIA WITH MESH, COMPLEX LYSIS OF ADHESIONS X1 HR TAR COMPONENT SEPARATION;  Surgeon: Greer Pickerel, MD;  [facility];  Service: General;  Laterality: N/A;    There were no vitals filed for this visit.  Subjective Assessment - 10/07/17 1458    Subjective  I saw the MD.  He said I needed to strengthen my quads. I have difficulty getting off the commode.  I see the MD in 3 months.          [facility] PT Assessment - 10/07/17 0001      Circumferential Edema   Circumferential - Right  17 1/8                  [facility] Adult PT Treatment/Exercise - 10/07/17 0001      Self-Care   Other Self-Care Comments   scar care, edema,      Knee/Hip Exercises: Aerobic   Stationary Bike  5 min L4      Knee/Hip Exercises: Machines for Strengthening   Cybex Knee Extension  2 sets 10 ,  hold 5-10 seconds with slow lowering 10 LBS    Cybex Knee Flexion  both ,  15 LBS 3 sets 10 x,  cued to pause at bottom      Knee/Hip Exercises: Standing   Heel Raises  -- reviewed how to work on more during he day    Other Standing Knee Exercises  step down , forward,  side and reverse multiple reps  HEP.      Knee/Hip Exercises: Seated   Sit to Sand  5 reps eccentric,  stopping 1/2 way down      Vasopneumatic   Number Minutes Vasopneumatic   15  minutes    Vasopnuematic Location   Knee    Vasopneumatic Pressure  Medium    Vasopneumatic Temperature   34             PT Education - 10/07/17 1546    Education provided  Yes    Education Details  HEP    Person(s) Educated  Patient    Methods  Explanation;Demonstration;Verbal cues;Handout    Comprehension  Verbalized understanding;Returned demonstration       PT Short Term Goals - 09/23/17 1402      PT SHORT TERM GOAL #1   Title  He will be independent with initial HEp    Baseline  so far independent    Time  3    Period  Weeks    Status  Achieved      PT SHORT TERM GOAL #2   Title  pain with activity decreased >/= 25%    Baseline  at least 25%    Time  3    Period  Weeks    Status  Achieved        PT Long Term Goals - 10/07/17 1603      PT LONG TERM GOAL #1   Title  Pt will improve FOTO score to <50% impaired to demo improvement in functional mobility.     Time  6    Period  Weeks    Status  Achieved      PT LONG TERM GOAL #2   Title  He will be independent with all HEP issued    Baseline  cues needed    Time  6    Period  Weeks    Status  On-going      PT LONG TERM GOAL #3   Title  Pt will be able to walk as needed in the community without limitation of pain.     Baseline  long distances will get sore, 600 feet is as long as he can go at this point     Time  6    Period  Weeks    Status  On-going      PT LONG TERM GOAL #5   Title  Pt will demo Lt knee AROM in extension to lacking no more than 5 deg for proper gait mechanics     Baseline  8 deg AROM    Time  6    Period  Weeks    Status  On-going            Plan - 10/07/17 1554    Clinical Impression Statement  girth 17 1/8  inch.  1/10 pain at end of session.    PT Next Visit Plan  gait, tolerance for walking, endurance , ROM, strength in core and hips , seated mobs? DGI? Quad strebgth    PT Home Exercise Plan  issued HEP for knee flexibility, seated HSS, sidelying ITB stretch,  gastroc stretch on stairs, heel raises on step,  wall sit,  mini squat. step downs 3 way    Consulted and Agree with Plan of Care  Patient       Patient will benefit from skilled therapeutic intervention in order to improve the following deficits and impairments:     Visit Diagnosis: Localized edema  Acute pain of left knee  Difficulty in walking, not elsewhere classified  Chronic low back pain, unspecified back pain laterality, with sciatica presence unspecified  Abnormal posture     Problem List Patient Active Problem List   Diagnosis Date Noted  . Primary osteoarthritis of left knee 08/03/2017  . S/P total knee replacement using cement, left 08/03/2017  . SBO (small bowel obstruction) ([place]) 03/18/2017  . Elevated d-dimer   . Hypoxia   . SOB (shortness of breath)   . Small bowel obstruction ([place]) 10/20/2016  . AKI (acute kidney injury) ([place])  10/20/2016  . Acute respiratory failure with hypoxia ([place]) 10/20/2016  . Chronic pain of left knee 07/06/2016  . Port catheter in place 02/27/2016  . Claudication ([place]) 02/18/2016  . Statin intolerance 02/18/2016  . Essential hypertension 02/18/2016  . Coronary artery disease due to lipid rich plaque 08/29/2015  . Acute on chronic diastolic CHF (congestive heart failure), NYHA class 2 ([place]) 08/29/2015  . 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 Hypoxemia requiring supplemental oxygen   . Atrial fibrillation ([place]) 07/23/2014  . COPD (chronic obstructive pulmonary disease) ([place]) 07/06/2014  . Hypothyroidism 07/06/2014  . Numbness-Left Thigh 12/14/2013  . AAA- s/p Ao-Iliac BPG March 2015 10/20/2013  . Hyperlipidemia 10/09/2013  . Hepatic cirrhosis ([place]) 12/02/2012  . Cancer of right lung ([place]) 08/06/2011    HARRIS,KAREN PTA 10/07/2017, 4:39 PM  [facility] [address] [place], [place], [address] Phone: [phone]   Fax:   [phone]  Name: EH SESAY MRN: [id number] Date of Birth: [birth year]

## 2017-10-07 NOTE — Patient Instructions (Signed)
Knee Extension: Step-Down Forward / Sideways / Backward (Eccentric)    Stand, holding support, affected foot on step. Slowly bend affected knee for 3-5 seconds and bring other heel forward to floor. Straighten affected leg. Repeat, placing foot flat to side. Repeat, touching toe behind. 10___ reps per set, 1-2___ sets per day, _3-4__ days per week.   [email or web address]      .

## 2017-10-12 ENCOUNTER — Encounter: Payer: Self-pay | Admitting: Physical Therapy

## 2017-10-12 ENCOUNTER — Ambulatory Visit: Payer: PPO | Admitting: Physical Therapy

## 2017-10-12 DIAGNOSIS — R600 Localized edema: Secondary | ICD-10-CM

## 2017-10-12 DIAGNOSIS — M25562 Pain in left knee: Secondary | ICD-10-CM

## 2017-10-12 DIAGNOSIS — R262 Difficulty in walking, not elsewhere classified: Secondary | ICD-10-CM

## 2017-10-12 NOTE — Therapy (Signed)
[place], [place], [address] Phone: [phone]   Fax:  [phone]  Physical Therapy Treatment/Renewal  Patient Details  Name: Joseph [name] MRN: [id number] Date of Birth: Dec 13, 1940 Referring Provider: Dr. [name]   Encounter Date: 10/12/2017  PT End of Session - 10/12/17 1043    Visit Number  12    Number of Visits  18    Date for PT Re-Evaluation  11/02/17    PT Start Time  7322    PT Stop Time  1115    PT Time Calculation (min)  61 min    Activity Tolerance  Patient tolerated treatment well    Behavior During Therapy  [facility] for tasks assessed/performed       Past Medical History:  Diagnosis Date  . Acute on chronic diastolic CHF (congestive heart failure), NYHA class 3 ([place]) 07/23/2014  . Arthritis    left knee and back arthrtis  . Atrial fibrillation with rapid ventricular response ([place]) 07/04/2014  . CAD (coronary artery disease)   . Cataract   . Cirrhosis ([place])    By radiography, seen on CT  . Diverticulosis   . Emphysema   . Enlarged prostate   . Headache    when younger but not any longer  . Heart murmur    teen  . History of abdominal aortic aneurysm (AAA)   . History of colon polyps 2006,2008  . History of transfusion 2015  . Hx of radiation therapy 09/02/11 to 10/19/11   chest  . Hx of radiation therapy   . Hyperlipidemia    takes Pravastatin  . Hypothyroidism    takes Synthroid daily  . Inguinal hernia   . Lung cancer ([place])    right upper lobe  . Personal history of adenomatous colonic polyps 07/04/2012   2006, adenomatous right colon polyp removed, then ablated in 2007 followup, 2008, adenomatous polyp of the right colon removed with hot biopsy forceps. All by Dr. Lajoyce Corners 01/03/2013 diminutive polyp ascending colon - adenoma    . Rosacea   . Shortness of breath    with exertion  . Small bowel obstruction ([place]) 07/05/2014  . Status post chemotherapy    last chemo  2013  . Umbilical hernia, incarcerated- surgery 07/10/14 07/05/2014    Past Surgical History:  Procedure Laterality Date  . ABDOMINAL AORTAGRAM  10/20/2013   Procedure: ABDOMINAL Maxcine Ham;  Surgeon: Elam Dutch, MD;  Location: [facility] CATH LAB;  Service: Cardiovascular;;  . ABDOMINAL AORTIC ANEURYSM REPAIR N/A 10/31/2013   Procedure: aorto to left external iliac and right external iliac and left internal iliac, reimplantation of inferior mesenteric artery and ligation of left iliac artery aneursym;  Surgeon: Elam Dutch, MD;  Location: [place];  Service: Vascular;  Laterality: N/A;  . ABDOMINAL WOUND DEHISCENCE N/A 11/10/2013   Procedure: ABDOMINAL WOUND CLOSURE ;  Surgeon: Serafina Mitchell, MD;  Location: [place];  Service: Vascular;  Laterality: N/A;  . bletharoplasty    . BOWEL RESECTION N/A 07/10/2014   Procedure: SMALL BOWEL RESECTION;  Surgeon: [name], MD;  Location: [place];  Service: General;  Laterality: N/A;  . CARDIAC CATHETERIZATION  2003  . CATARACT EXTRACTION, BILATERAL    . COLONOSCOPY W/ BIOPSIES     multiple   . ESOPHAGOGASTRODUODENOSCOPY  2006  . FINGER SURGERY  2008   left pointer finger  . HERNIA REPAIR  70's   x3  . INCISIONAL HERNIA REPAIR N/A 07/10/2014  Procedure: HERNIA REPAIR INCISIONAL;  Surgeon: [name], MD;  Location: [place];  Service: General;  Laterality: N/A;  . KNEE ARTHROSCOPY  2008   left knee  . LAPAROTOMY N/A 07/10/2014   Procedure: EXPLORATORY LAPAROTOMY;  Surgeon: [name], MD;  Location: [place];  Service: General;  Laterality: N/A;  . LOWER EXTREMITY ANGIOGRAM Bilateral 10/20/2013   Procedure: LOWER EXTREMITY ANGIOGRAM;  Surgeon: Elam Dutch, MD;  Location: [facility] CATH LAB;  Service: Cardiovascular;  Laterality: Bilateral;  . LYSIS OF ADHESION N/A 07/10/2014   Procedure: LYSIS OF ADHESION - 90 minutes;  Surgeon: [name], MD;  Location: [place];  Service: General;  Laterality: N/A;  . PORTACATH PLACEMENT  08/10/2011   Procedure:  INSERTION PORT-A-CATH;  Surgeon: Pierre Bali, MD;  Location: [place];  Service: Thoracic;  Laterality: Left;  Left Subclavian  . PTOSIS REPAIR Bilateral 12-02-2015  . THROMBECTOMY ILIAC ARTERY Bilateral 10/31/2013   Procedure: THROMBECTOMY ILIAC ARTERY;  Surgeon: Elam Dutch, MD;  Location: [place];  Service: Vascular;  Laterality: Bilateral;  . TONSILLECTOMY     as a child  . TOTAL KNEE ARTHROPLASTY Left 08/03/2017   Procedure: LEFT TOTAL KNEE ARTHROPLASTY;  Surgeon: [name], MD;  Location: [place];  Service: Orthopedics;  Laterality: Left;  Marland Kitchen VASECTOMY  70's  . VENTRAL HERNIA REPAIR N/A 04/04/2015   Procedure: OPEN REPAIR OF RECURRENT ABDOMINAL WALL HERNIA WITH MESH, COMPLEX LYSIS OF ADHESIONS X1 HR TAR COMPONENT SEPARATION;  Surgeon: Greer Pickerel, MD;  [facility];  Service: General;  Laterality: N/A;    There were no vitals filed for this visit.  Subjective Assessment - 10/12/17 1014    Subjective  I want to continue therapy.  [facility] knee extension.  It feels good today.      Currently in Pain?  No/denies          [facility] Adult PT Treatment/Exercise - 10/12/17 0001      Knee/Hip Exercises: Stretches   Active Hamstring Stretch  Left;3 reps;30 seconds    ITB Stretch  Left;3 reps    Gastroc Stretch  Both;3 reps;30 seconds      Knee/Hip Exercises: Aerobic   Elliptical  2 min L 5 ramp level 1 rsist.  unable to continue due to dyspnea      Knee/Hip Exercises: Machines for Strengthening   Cybex Knee Extension  2 sets x  15 ,  hold 5-10 seconds with slow lowering 10 LBS eccentric lowering LLE for 2nd set     Cybex Leg Press  2 plates x 20  calf raise x 10       Knee/Hip Exercises: Supine   Short Arc Quad Sets  Strengthening;Left;1 set;15 reps 5 lbs     Bridges with [employer]  Strengthening;Both;1 set;10 reps    Straight Leg Raises  Strengthening;Left;1 set;10 reps 5    Straight Leg Raise with External Rotation  Strengthening;Left;1 set;15 reps  5      Knee/Hip Exercises: Sidelying   Hip ABduction  Strengthening;Left;1 set;20 reps    Clams  x 20       Vasopneumatic   Number Minutes Vasopneumatic   15 minutes    Vasopnuematic Location   Knee    Vasopneumatic Pressure  Medium    Vasopneumatic Temperature   34      Manual Therapy   Soft tissue mobilization  Lateral hamstring and ITB to tolerance IASTM  PT Education - 10/12/17 1200    Education provided  Yes    Education Details  progress and renewal     Person(s) Educated  Patient    Methods  Explanation    Comprehension  Verbalized understanding       PT Short Term Goals - 10/12/17 1034      PT SHORT TERM GOAL #1   Title  He will be independent with initial HEp    Status  Achieved      PT SHORT TERM GOAL #2   Title  pain with activity decreased >/= 25%    Status  Achieved        PT Long Term Goals - 10/12/17 1034      PT LONG TERM GOAL #1   Title  Pt will improve FOTO score to <50% impaired to demo improvement in functional mobility.     Status  Achieved      PT LONG TERM GOAL #2   Title  He will be independent with all HEP issued    Status  On-going      PT LONG TERM GOAL #3   Title  Pt will be able to walk as needed in the community without limitation of pain.     Baseline  long distances will get sore, 600 feet is as long as he can go at this point     Status  On-going      PT LONG TERM GOAL #4   Title  He will report gradual return to gym to increase fitness and lose weight    Baseline  [facility]    Status  On-going      PT LONG TERM GOAL #5   Title  Pt will demo Lt knee AROM in extension to lacking no more than 5 deg for proper gait mechanics     Status  On-going            Plan - 10/12/17 1035    Clinical Impression Statement  Patient continues to improve in ROM and strength.  Would like to finish POC for quad strengthening, transitioning to gym.  He is limited in how long he can walk without pain in  hips, knees.  He will benefit from more PT to further advance endurance and LLE function.     PT Frequency  2x / week    PT Duration  3 weeks    PT Treatment/Interventions  ADLs/Self Care Home Management;Patient/family education;Passive range of motion;Therapeutic activities;Moist Heat;Cryotherapy;Electrical Stimulation;Gait training;Neuromuscular re-education;Manual techniques;Taping;Manual lymph drainage;Vasopneumatic Device;Therapeutic exercise;Balance training    PT Next Visit Plan  gait, tolerance for walking, endurance , ROM, strength in core and hips , seated mobs? DGI? Quad strebgth    PT Home Exercise Plan  issued HEP for knee flexibility, seated HSS, sidelying ITB stretch, gastroc stretch on stairs, heel raises on step,  wall sit,  mini squat. step downs 3 way    Consulted and Agree with Plan of Care  Patient       Patient will benefit from skilled therapeutic intervention in order to improve the following deficits and impairments:  Decreased range of motion, Difficulty walking, Increased fascial restricitons, Pain, Impaired flexibility, Hypomobility, Decreased balance, Decreased mobility, Decreased strength, Increased edema  Visit Diagnosis: Localized edema  Acute pain of left knee  Difficulty in walking, not elsewhere classified     Problem List Patient Active Problem List   Diagnosis Date Noted  . Primary osteoarthritis of left knee  08/03/2017  . S/P total knee replacement using cement, left 08/03/2017  . SBO (small bowel obstruction) ([place]) 03/18/2017  . Elevated d-dimer   . Hypoxia   . SOB (shortness of breath)   . Small bowel obstruction ([employer]) 10/20/2016  . AKI (acute kidney injury) ([place]) 10/20/2016  . Acute respiratory failure with hypoxia ([place]) 10/20/2016  . Chronic pain of left knee 07/06/2016  . Port catheter in place 02/27/2016  . Claudication ([place]) 02/18/2016  . Statin intolerance 02/18/2016  . Essential hypertension 02/18/2016  . Coronary artery disease  due to lipid rich plaque 08/29/2015  . Acute on chronic diastolic CHF (congestive heart failure), NYHA class 2 ([place]) 08/29/2015  . 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 Hypoxemia requiring supplemental oxygen   . Atrial fibrillation ([place]) 07/23/2014  . COPD (chronic obstructive pulmonary disease) ([name]) 07/06/2014  . Hypothyroidism 07/06/2014  . Numbness-Left Thigh 12/14/2013  . AAA- s/p Ao-Iliac BPG March 2015 10/20/2013  . Hyperlipidemia 10/09/2013  . Hepatic cirrhosis ([place]) 12/02/2012  . Cancer of right lung [facility]) 08/06/2011    Reyana Leisey 10/12/2017, 12:12 PM  [facility] [address] [place], [place], [address] Phone: [phone]   Fax:  [phone]  Name: LEA WALBERT MRN: [id number] Date of Birth: 09-May-1941  Raeford Razor, PT 10/12/17 12:12 PM Phone: [phone] Fax: [phone]

## 2017-10-14 ENCOUNTER — Ambulatory Visit: Payer: PPO | Admitting: Physical Therapy

## 2017-10-14 ENCOUNTER — Encounter: Payer: Self-pay | Admitting: Physical Therapy

## 2017-10-14 DIAGNOSIS — R600 Localized edema: Secondary | ICD-10-CM

## 2017-10-14 DIAGNOSIS — M25562 Pain in left knee: Secondary | ICD-10-CM

## 2017-10-14 DIAGNOSIS — R262 Difficulty in walking, not elsewhere classified: Secondary | ICD-10-CM

## 2017-10-14 NOTE — Therapy (Signed)
[place], [place], [address] Phone: [phone]   Fax:  [phone]  Physical Therapy Treatment  Patient Details  Name: Joseph [name] MRN: [id number] Date of Birth: Aug 17, 1941 Referring Provider: Dr. [name]   Encounter Date: 10/14/2017  PT End of Session - 10/14/17 1706    Visit Number  13    Number of Visits  18    Date for PT Re-Evaluation  11/02/17    PT Start Time  3736    PT Stop Time  1600    PT Time Calculation (min)  64 min    Activity Tolerance  Patient tolerated treatment well    Behavior During Therapy  [facility] for tasks assessed/performed       Past Medical History:  Diagnosis Date  . Acute on chronic diastolic CHF (congestive heart failure), NYHA class 3 ([place]) 07/23/2014  . Arthritis    left knee and back arthrtis  . Atrial fibrillation with rapid ventricular response ([place]) 07/04/2014  . CAD (coronary artery disease)   . Cataract   . Cirrhosis ([place])    By radiography, seen on CT  . Diverticulosis   . Emphysema   . Enlarged prostate   . Headache    when younger but not any longer  . Heart murmur    teen  . History of abdominal aortic aneurysm (AAA)   . History of colon polyps 2006,2008  . History of transfusion 2015  . Hx of radiation therapy 09/02/11 to 10/19/11   chest  . Hx of radiation therapy   . Hyperlipidemia    takes Pravastatin  . Hypothyroidism    takes Synthroid daily  . Inguinal hernia   . Lung cancer ([place])    right upper lobe  . Personal history of adenomatous colonic polyps 07/04/2012   2006, adenomatous right colon polyp removed, then ablated in 2007 followup, 2008, adenomatous polyp of the right colon removed with hot biopsy forceps. All by Dr. Lajoyce Corners 01/03/2013 diminutive polyp ascending colon - adenoma    . Rosacea   . Shortness of breath    with exertion  . Small bowel obstruction ([place]) 07/05/2014  . Status post chemotherapy    last chemo 2013  .  Umbilical hernia, incarcerated- surgery 07/10/14 07/05/2014    Past Surgical History:  Procedure Laterality Date  . ABDOMINAL AORTAGRAM  10/20/2013   Procedure: ABDOMINAL Maxcine Ham;  Surgeon: Elam Dutch, MD;  Location: [facility] CATH LAB;  Service: Cardiovascular;;  . ABDOMINAL AORTIC ANEURYSM REPAIR N/A 10/31/2013   Procedure: aorto to left external iliac and right external iliac and left internal iliac, reimplantation of inferior mesenteric artery and ligation of left iliac artery aneursym;  Surgeon: Elam Dutch, MD;  Location: [place];  Service: Vascular;  Laterality: N/A;  . ABDOMINAL WOUND DEHISCENCE N/A 11/10/2013   Procedure: ABDOMINAL WOUND CLOSURE ;  Surgeon: Serafina Mitchell, MD;  Location: [place];  Service: Vascular;  Laterality: N/A;  . bletharoplasty    . BOWEL RESECTION N/A 07/10/2014   Procedure: SMALL BOWEL RESECTION;  Surgeon: [name], MD;  Location: [place];  Service: General;  Laterality: N/A;  . CARDIAC CATHETERIZATION  2003  . CATARACT EXTRACTION, BILATERAL    . COLONOSCOPY W/ BIOPSIES     multiple   . ESOPHAGOGASTRODUODENOSCOPY  2006  . FINGER SURGERY  2008   left pointer finger  . HERNIA REPAIR  70's   x3  . INCISIONAL HERNIA REPAIR N/A 07/10/2014  Procedure: HERNIA REPAIR INCISIONAL;  Surgeon: [name], MD;  Location: [place];  Service: General;  Laterality: N/A;  . KNEE ARTHROSCOPY  2008   left knee  . LAPAROTOMY N/A 07/10/2014   Procedure: EXPLORATORY LAPAROTOMY;  Surgeon: [name], MD;  Location: [place];  Service: General;  Laterality: N/A;  . LOWER EXTREMITY ANGIOGRAM Bilateral 10/20/2013   Procedure: LOWER EXTREMITY ANGIOGRAM;  Surgeon: Elam Dutch, MD;  Location: [facility] CATH LAB;  Service: Cardiovascular;  Laterality: Bilateral;  . LYSIS OF ADHESION N/A 07/10/2014   Procedure: LYSIS OF ADHESION - 90 minutes;  Surgeon: [name], MD;  Location: [place];  Service: General;  Laterality: N/A;  . PORTACATH PLACEMENT  08/10/2011   Procedure:  INSERTION PORT-A-CATH;  Surgeon: Pierre Bali, MD;  Location: [place];  Service: Thoracic;  Laterality: Left;  Left Subclavian  . PTOSIS REPAIR Bilateral 12-02-2015  . THROMBECTOMY ILIAC ARTERY Bilateral 10/31/2013   Procedure: THROMBECTOMY ILIAC ARTERY;  Surgeon: Elam Dutch, MD;  Location: [place];  Service: Vascular;  Laterality: Bilateral;  . TONSILLECTOMY     as a child  . TOTAL KNEE ARTHROPLASTY Left 08/03/2017   Procedure: LEFT TOTAL KNEE ARTHROPLASTY;  Surgeon: [name], MD;  Location: [place];  Service: Orthopedics;  Laterality: Left;  Marland Kitchen VASECTOMY  70's  . VENTRAL HERNIA REPAIR N/A 04/04/2015   Procedure: OPEN REPAIR OF RECURRENT ABDOMINAL WALL HERNIA WITH MESH, COMPLEX LYSIS OF ADHESIONS X1 HR TAR COMPONENT SEPARATION;  Surgeon: Greer Pickerel, MD;  [facility];  Service: General;  Laterality: N/A;    There were no vitals filed for this visit.  Subjective Assessment - 10/14/17 1514    Subjective  Sore.  Wenyt to gym yesterday and may have overdone it.   Some swelling lateral knee.  I may have overdone it on the bike.   No real pain.  I don't go to gym on same days as PT.  (Pended)     Currently in Pain?  No/denies  (Pended)                       [facility] Adult PT Treatment/Exercise - 10/14/17 0001      Knee/Hip Exercises: Stretches   Passive Hamstring Stretch  3 reps;30 seconds foot resting on mat  while sitting in chair,  moved chair    Quad Stretch  5 reps;30 seconds with quad strumming and distal patellar glides    Hip Flexor Stretch  3 reps;30 seconds    ITB Stretch  Left;3 reps      Knee/Hip Exercises: Aerobic   Nustep  5 minutes L5,  cues needed to slow down ,  HR 105      Knee/Hip Exercises: Machines for Strengthening   Cybex Knee Extension  3 sets x  15 ,  hold 5-10 seconds with slow lowering 10 LBS eccentric lowering LLE for 2nd set     Cybex Knee Flexion  3 sets  both,  cued to pause with flexion and slowly release    Cybex Leg Press  --  calf raise x 10       Knee/Hip Exercises: Standing   Hip Flexion  10 reps each cued high lift Challanging for balance.No hands,  SBA    SLS  2-3 seconds max    SLS with Vectors  [place]  ball to trampoline both and single legs 1 rep max ,  challanging      Knee/Hip Exercises:  Supine   Bridges  10 reps    Straight Leg Raises  10 reps    Patellar Mobs  non tight      Cryotherapy   Number Minutes Cryotherapy  10 Minutes    Cryotherapy Location  Knee    Type of Cryotherapy  -- cold pack      Manual Therapy   Soft tissue mobilization  Lateral hamstring and ITB  gentle             PT Education - 10/14/17 1657    Education provided  Yes    Education Details  How to stretch hamstrings/ knee extension    Person(s) Educated  Patient    Methods  Explanation;Demonstration;Verbal cues    Comprehension  Verbalized understanding;Returned demonstration       PT Short Term Goals - 10/12/17 1034      PT SHORT TERM GOAL #1   Title  He will be independent with initial HEp    Status  Achieved      PT SHORT TERM GOAL #2   Title  pain with activity decreased >/= 25%    Status  Achieved        PT Long Term Goals - 10/12/17 1034      PT LONG TERM GOAL #1   Title  Pt will improve FOTO score to <50% impaired to demo improvement in functional mobility.     Status  Achieved      PT LONG TERM GOAL #2   Title  He will be independent with all HEP issued    Status  On-going      PT LONG TERM GOAL #3   Title  Pt will be able to walk as needed in the community without limitation of pain.     Baseline  long distances will get sore, 600 feet is as long as he can go at this point     Status  On-going      PT LONG TERM GOAL #4   Title  He will report gradual return to gym to increase fitness and lose weight    [facility]    Status  On-going      PT LONG TERM GOAL #5   Title  Pt will demo Lt knee AROM in extension to lacking no more than 5 deg for proper gait  mechanics     Status  On-going            Plan - 10/14/17 1708    Clinical Impression Statement  No pain at end of session. Questions answered as I was able,  Medication questions for night pain were refrerred to his MD. (He was reading about something online)  Quad lag minimal today however he lacks full extension..  Stretching techniques again reviewed.   Mild sorenedd noted by patient at end of session.  No new goals met.    PT Next Visit Plan  gait, tolerance for walking, endurance , ROM, strength in core and hips , seated mobs? DGI? Quad strebgth    PT Home Exercise Plan  issued HEP for knee flexibility, seated HSS, sidelying ITB stretch, gastroc stretch on stairs, heel raises on step,  wall sit,  mini squat. step downs 3 way    Consulted and Agree with Plan of Care  Patient       Patient will benefit from skilled therapeutic intervention in order to improve the following deficits and impairments:  Visit Diagnosis: Localized edema  Acute pain of left knee  Difficulty in walking, not elsewhere classified     Problem List Patient Active Problem List   Diagnosis Date Noted  . Primary osteoarthritis of left knee 08/03/2017  . S/P total knee replacement using cement, left 08/03/2017  . SBO (small bowel obstruction) ([place]) 03/18/2017  . Elevated d-dimer   . Hypoxia   . SOB (shortness of breath)   . Small bowel obstruction ([place]) 10/20/2016  . AKI (acute kidney injury) ([place]) 10/20/2016  . Acute respiratory failure with hypoxia ([place]) 10/20/2016  . Chronic pain of left knee 07/06/2016  . Port catheter in place 02/27/2016  . Claudication ([place]) 02/18/2016  . Statin intolerance 02/18/2016  . Essential hypertension 02/18/2016  . Coronary artery disease due to lipid rich plaque 08/29/2015  . Acute on chronic diastolic CHF (congestive heart failure), NYHA class 2 ([place]) 08/29/2015  . 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  Hypoxemia requiring supplemental oxygen   . Atrial fibrillation ([place]) 07/23/2014  . COPD (chronic obstructive pulmonary disease) ([place]) 07/06/2014  . Hypothyroidism 07/06/2014  . Numbness-Left Thigh 12/14/2013  . AAA- s/p Ao-Iliac BPG March 2015 10/20/2013  . Hyperlipidemia 10/09/2013  . Hepatic cirrhosis ([place]) 12/02/2012  . Cancer of right lung ([place]) 08/06/2011    Aracely Rickett  PTA 10/14/2017, 5:16 PM  [place], [place], [address] Phone: [phone]   Fax:  [phone]  Name: Joseph [name] MRN: [id number] Date of Birth: 07-31-1941

## 2017-10-19 ENCOUNTER — Encounter: Payer: Self-pay | Admitting: Physical Therapy

## 2017-10-19 ENCOUNTER — Ambulatory Visit: Payer: PPO | Admitting: Physical Therapy

## 2017-10-19 DIAGNOSIS — R600 Localized edema: Secondary | ICD-10-CM

## 2017-10-19 DIAGNOSIS — M25562 Pain in left knee: Secondary | ICD-10-CM

## 2017-10-19 DIAGNOSIS — R262 Difficulty in walking, not elsewhere classified: Secondary | ICD-10-CM

## 2017-10-19 NOTE — Therapy (Signed)
[place], [place], [address] Phone: [phone]   Fax:  [phone]  Physical Therapy Treatment  Patient Details  Name: Joseph [name] MRN: [id number] Date of Birth: 1941-07-28 Referring Provider: Dr. [name]   Encounter Date: 10/19/2017  PT End of Session - 10/19/17 1110    Visit Number  14    Number of Visits  18    Date for PT Re-Evaluation  11/02/17    PT Start Time  1018    PT Stop Time  1115    PT Time Calculation (min)  57 min    Activity Tolerance  Patient tolerated treatment well    Behavior During Therapy  [facility] for tasks assessed/performed       Past Medical History:  Diagnosis Date  . Acute on chronic diastolic CHF (congestive heart failure), NYHA class 3 ([place]) 07/23/2014  . Arthritis    left knee and back arthrtis  . Atrial fibrillation with rapid ventricular response ([place]) 07/04/2014  . CAD (coronary artery disease)   . Cataract   . Cirrhosis ([place])    By radiography, seen on CT  . Diverticulosis   . Emphysema   . Enlarged prostate   . Headache    when younger but not any longer  . Heart murmur    teen  . History of abdominal aortic aneurysm (AAA)   . History of colon polyps 2006,2008  . History of transfusion 2015  . Hx of radiation therapy 09/02/11 to 10/19/11   chest  . Hx of radiation therapy   . Hyperlipidemia    takes Pravastatin  . Hypothyroidism    takes Synthroid daily  . Inguinal hernia   . Lung cancer ([place])    right upper lobe  . Personal history of adenomatous colonic polyps 07/04/2012   2006, adenomatous right colon polyp removed, then ablated in 2007 followup, 2008, adenomatous polyp of the right colon removed with hot biopsy forceps. All by Dr. Lajoyce Corners 01/03/2013 diminutive polyp ascending colon - adenoma    . Rosacea   . Shortness of breath    with exertion  . Small bowel obstruction ([place]) 07/05/2014  . Status post chemotherapy    last chemo 2013  .  Umbilical hernia, incarcerated- surgery 07/10/14 07/05/2014    Past Surgical History:  Procedure Laterality Date  . ABDOMINAL AORTAGRAM  10/20/2013   Procedure: ABDOMINAL Maxcine Ham;  Surgeon: Elam Dutch, MD;  Location: [facility]ATH LAB;  Service: Cardiovascular;;  . ABDOMINAL AORTIC ANEURYSM REPAIR N/A 10/31/2013   Procedure: aorto to left external iliac and right external iliac and left internal iliac, reimplantation of inferior mesenteric artery and ligation of left iliac artery aneursym;  Surgeon: Elam Dutch, MD;  Location: [place];  Service: Vascular;  Laterality: N/A;  . ABDOMINAL WOUND DEHISCENCE N/A 11/10/2013   Procedure: ABDOMINAL WOUND CLOSURE ;  Surgeon: Serafina Mitchell, MD;  Location: [place];  Service: Vascular;  Laterality: N/A;  . bletharoplasty    . BOWEL RESECTION N/A 07/10/2014   Procedure: SMALL BOWEL RESECTION;  Surgeon: [name], MD;  Location: [place];  Service: General;  Laterality: N/A;  . CARDIAC CATHETERIZATION  2003  . CATARACT EXTRACTION, BILATERAL    . COLONOSCOPY W/ BIOPSIES     multiple   . ESOPHAGOGASTRODUODENOSCOPY  2006  . FINGER SURGERY  2008   left pointer finger  . HERNIA REPAIR  70's   x3  . INCISIONAL HERNIA REPAIR N/A 07/10/2014  Procedure: HERNIA REPAIR INCISIONAL;  Surgeon: [name], MD;  Location: [place];  Service: General;  Laterality: N/A;  . KNEE ARTHROSCOPY  2008   left knee  . LAPAROTOMY N/A 07/10/2014   Procedure: EXPLORATORY LAPAROTOMY;  Surgeon: [name], MD;  Location: [place];  Service: General;  Laterality: N/A;  . LOWER EXTREMITY ANGIOGRAM Bilateral 10/20/2013   Procedure: LOWER EXTREMITY ANGIOGRAM;  Surgeon: Elam Dutch, MD;  Location: [facility] CATH LAB;  Service: Cardiovascular;  Laterality: Bilateral;  . LYSIS OF ADHESION N/A 07/10/2014   Procedure: LYSIS OF ADHESION - 90 minutes;  Surgeon: [name], MD;  Location: [place];  Service: General;  Laterality: N/A;  . PORTACATH PLACEMENT  08/10/2011   Procedure:  INSERTION PORT-A-CATH;  Surgeon: Pierre Bali, MD;  Location: [place];  Service: Thoracic;  Laterality: Left;  Left Subclavian  . PTOSIS REPAIR Bilateral 12-02-2015  . THROMBECTOMY ILIAC ARTERY Bilateral 10/31/2013   Procedure: THROMBECTOMY ILIAC ARTERY;  Surgeon: Elam Dutch, MD;  Location: [place];  Service: Vascular;  Laterality: Bilateral;  . TONSILLECTOMY     as a child  . TOTAL KNEE ARTHROPLASTY Left 08/03/2017   Procedure: LEFT TOTAL KNEE ARTHROPLASTY;  Surgeon: [name], MD;  Location: [place];  Service: Orthopedics;  Laterality: Left;  Marland Kitchen VASECTOMY  70's  . VENTRAL HERNIA REPAIR N/A 04/04/2015   Procedure: OPEN REPAIR OF RECURRENT ABDOMINAL WALL HERNIA WITH MESH, COMPLEX LYSIS OF ADHESIONS X1 HR TAR COMPONENT SEPARATION;  Surgeon: Greer Pickerel, MD;  Location[facility];  Service: General;  Laterality: N/A;    There were no vitals filed for this visit.  Subjective Assessment - 10/19/17 1025    Subjective  I have been using the inspire thing to help breathing.      Currently in Pain?  No/denies    Pain Score  -- 2/10  with palpation anterior knee    Pain Orientation  Distal;Lateral    Pain Descriptors / Indicators  -- IT band area,  brief getting out of car , no intensity given    Pain Type  Chronic pain;Surgical pain    Pain Frequency  Intermittent    Aggravating Factors   getting out of car    Pain Relieving Factors  change of position         [facility] PT Assessment - 10/19/17 0001      AROM   Left Knee Extension  -5 able to touch back of knee to table after execise    Left Knee Flexion  120                  [facility] Adult PT Treatment/Exercise - 10/19/17 0001      Knee/Hip Exercises: Stretches   Passive Hamstring Stretch  3 reps;30 seconds strap      Knee/Hip Exercises: Aerobic   Elliptical  1    Recumbent Bike  5 minutes 1.1 mile  changed L2 to L1 due to breathing(Very mild SOB not noted by patient)      Knee/Hip Exercises: Machines for Strengthening    Cybex Knee Extension  10 X 10 second hold single 1 leg 5 LBS 10 x 2 sets    Cybex Knee Flexion  2 sets 10 X 1,  20 X 1 10, 15 LBS      Knee/Hip Exercises: Standing   Step Down  2 sets;Hand Hold: 1;Step Height: 4"    Step Down Limitations  10 x 1,  7 X 1  max fatigue    Functional Squat  10 reps;5 seconds      Knee/Hip Exercises: Supine   Quad Sets  15 reps working on knee extension      Cryotherapy   Number Minutes Cryotherapy  10 Minutes    Cryotherapy Location  Knee    Type of Cryotherapy  -- cold pack with elevation             PT Education - 10/19/17 1110    Education provided  No       PT Short Term Goals - 10/12/17 1034      PT SHORT TERM GOAL #1   Title  He will be independent with initial HEp    Status  Achieved      PT SHORT TERM GOAL #2   Title  pain with activity decreased >/= 25%    Status  Achieved        PT Long Term Goals - 10/19/17 1115      PT LONG TERM GOAL #1   Title  Pt will improve FOTO score to <50% impaired to demo improvement in functional mobility.     Time  6    Period  Weeks    Status  Achieved      PT LONG TERM GOAL #2   Title  He will be independent with all HEP issued    Baseline  cues needed    Time  6    Period  Weeks    Status  On-going      PT LONG TERM GOAL #3   Title  Pt will be able to walk as needed in the community without limitation of pain.     Time  6    Period  Weeks    Status  Unable to assess      PT LONG TERM GOAL #4   Title  He will report gradual return to gym to increase fitness and lose weight    Baseline  gym not yet consistant    Time  6    Period  Weeks    Status  On-going      PT LONG TERM GOAL #5   Title  Pt will demo Lt knee AROM in extension to lacking no more than 5 deg for proper gait mechanics     Baseline  lacks 5 degrees after manual,  Consistant?    Time  6    Period  Weeks    Status  Partially Met            Plan - 10/19/17 1110    Clinical Impression Statement  [facility]  exercise with quick fatigue noted today.  SBA needed for safety.  AROM 120 to -5  knee  2/10 pain at end of session.Progress toward ROM goals.  Breathing also limits exercise activity.    PT Next Visit Plan  gait, tolerance for walking, endurance , ROM, strength in core and hips , seated mobs? DGI? Quad strebgth.  eccentric quads.      PT Home Exercise Plan  issued HEP for knee flexibility, seated HSS, sidelying ITB stretch, gastroc stretch on stairs, heel raises on step,  wall sit,  mini squat. step downs 3 way    Consulted and Agree with Plan of Care  Patient       Patient will benefit from skilled therapeutic intervention in order to improve the following deficits and impairments:     Visit Diagnosis: Localized edema  Acute pain  of left knee  Difficulty in walking, not elsewhere classified     Problem List Patient Active Problem List   Diagnosis Date Noted  . Primary osteoarthritis of left knee 08/03/2017  . S/P total knee replacement using cement, left 08/03/2017  . SBO (small bowel obstruction) ([place]) 03/18/2017  . Elevated d-dimer   . Hypoxia   . SOB (shortness of breath)   . Small bowel obstruction ([place]) 10/20/2016  . AKI (acute kidney injury) ([place]) 10/20/2016  . Acute respiratory failure with hypoxia ([place]) 10/20/2016  . Chronic pain of left knee 07/06/2016  . Port catheter in place 02/27/2016  . Claudication ([place]) 02/18/2016  . Statin intolerance 02/18/2016  . Essential hypertension 02/18/2016  . Coronary artery disease due to lipid rich plaque 08/29/2015  . Acute on chronic diastolic CHF (congestive heart failure), NYHA class 2 ([place]) 08/29/2015  . 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 Hypoxemia requiring supplemental oxygen   . Atrial fibrillation ([place]) 07/23/2014  . COPD (chronic obstructive pulmonary disease) ([place]) 07/06/2014  . Hypothyroidism 07/06/2014  . Numbness-Left Thigh 12/14/2013  . AAA- s/p Ao-Iliac BPG  March 2015 10/20/2013  . Hyperlipidemia 10/09/2013  . Hepatic cirrhosis ([place]) 12/02/2012  . Cancer of right lung ([place]) 08/06/2011    HARRIS,KAREN  PTA 10/19/2017, 11:30 AM  [facility] [address] [place], [place], [address] Phone: [phone]   Fax:  [phone]  Name: Joseph [name] MRN: [id number] Date of Birth: September 24, 1940

## 2017-10-21 ENCOUNTER — Ambulatory Visit: Payer: PPO | Admitting: Physical Therapy

## 2017-10-21 ENCOUNTER — Encounter: Payer: Self-pay | Admitting: Physical Therapy

## 2017-10-21 DIAGNOSIS — R262 Difficulty in walking, not elsewhere classified: Secondary | ICD-10-CM

## 2017-10-21 DIAGNOSIS — R600 Localized edema: Secondary | ICD-10-CM | POA: Diagnosis not present

## 2017-10-21 DIAGNOSIS — G8929 Other chronic pain: Secondary | ICD-10-CM

## 2017-10-21 DIAGNOSIS — M545 Low back pain: Secondary | ICD-10-CM

## 2017-10-21 DIAGNOSIS — R293 Abnormal posture: Secondary | ICD-10-CM

## 2017-10-21 DIAGNOSIS — M25562 Pain in left knee: Secondary | ICD-10-CM

## 2017-10-21 NOTE — Patient Instructions (Signed)
Knee Extension: Step-Down Forward / Sideways / Backward (Eccentric)    Stand, holding support, affected foot on step. Slowly bend affected knee for 3-5 seconds and bring other heel forward to floor. Quickly straighten affected leg. Repeat, placing foot flat to side. Repeat, touching toe behind. ___ reps per set, ___ sets per day, ___ days per week.   http://ecce.exer.us/134   Copyright  VHI. All rights reserved.

## 2017-10-21 NOTE — Therapy (Signed)
[place], [place], [address] Phone: [phone]   Fax:  [phone]  Physical Therapy Treatment  Patient Details  Name: Joseph [name] MRN: [id number] Date of Birth: 1940/11/17 Referring Provider: Dr. [name]   Encounter Date: 10/21/2017  PT End of Session - 10/21/17 1018    Visit Number  15    Number of Visits  18    Date for PT Re-Evaluation  11/02/17    PT Start Time  1016    PT Stop Time  1112    PT Time Calculation (min)  56 min    Activity Tolerance  Patient tolerated treatment well    Behavior During Therapy  [facility] for tasks assessed/performed       Past Medical History:  Diagnosis Date  . Acute on chronic diastolic CHF (congestive heart failure), NYHA class 3 ([place]) 07/23/2014  . Arthritis    left knee and back arthrtis  . Atrial fibrillation with rapid ventricular response ([place]) 07/04/2014  . CAD (coronary artery disease)   . Cataract   . Cirrhosis ([place])    By radiography, seen on CT  . Diverticulosis   . Emphysema   . Enlarged prostate   . Headache    when younger but not any longer  . Heart murmur    teen  . History of abdominal aortic aneurysm (AAA)   . History of colon polyps 2006,2008  . History of transfusion 2015  . Hx of radiation therapy 09/02/11 to 10/19/11   chest  . Hx of radiation therapy   . Hyperlipidemia    takes Pravastatin  . Hypothyroidism    takes Synthroid daily  . Inguinal hernia   . Lung cancer ([place])    right upper lobe  . Personal history of adenomatous colonic polyps 07/04/2012   2006, adenomatous right colon polyp removed, then ablated in 2007 followup, 2008, adenomatous polyp of the right colon removed with hot biopsy forceps. All by Dr. Lajoyce Corners 01/03/2013 diminutive polyp ascending colon - adenoma    . Rosacea   . Shortness of breath    with exertion  . Small bowel obstruction ([place]) 07/05/2014  . Status post chemotherapy    last chemo 2013  .  Umbilical hernia, incarcerated- surgery 07/10/14 07/05/2014    Past Surgical History:  Procedure Laterality Date  . ABDOMINAL AORTAGRAM  10/20/2013   Procedure: ABDOMINAL Maxcine Ham;  Surgeon: Elam Dutch, MD;  Location: [facility] CATH LAB;  Service: Cardiovascular;;  . ABDOMINAL AORTIC ANEURYSM REPAIR N/A 10/31/2013   Procedure: aorto to left external iliac and right external iliac and left internal iliac, reimplantation of inferior mesenteric artery and ligation of left iliac artery aneursym;  Surgeon: Elam Dutch, MD;  Location: [place];  Service: Vascular;  Laterality: N/A;  . ABDOMINAL WOUND DEHISCENCE N/A 11/10/2013   Procedure: ABDOMINAL WOUND CLOSURE ;  Surgeon: Serafina Mitchell, MD;  Location: [place];  Service: Vascular;  Laterality: N/A;  . bletharoplasty    . BOWEL RESECTION N/A 07/10/2014   Procedure: SMALL BOWEL RESECTION;  Surgeon: [name], MD;  Location: [place];  Service: General;  Laterality: N/A;  . CARDIAC CATHETERIZATION  2003  . CATARACT EXTRACTION, BILATERAL    . COLONOSCOPY W/ BIOPSIES     multiple   . ESOPHAGOGASTRODUODENOSCOPY  2006  . FINGER SURGERY  2008   left pointer finger  . HERNIA REPAIR  70's   x3  . INCISIONAL HERNIA REPAIR N/A 07/10/2014  Procedure: HERNIA REPAIR INCISIONAL;  Surgeon: [name], MD;  Location: [place];  Service: General;  Laterality: N/A;  . KNEE ARTHROSCOPY  2008   left knee  . LAPAROTOMY N/A 07/10/2014   Procedure: EXPLORATORY LAPAROTOMY;  Surgeon: [name], MD;  Location: [place];  Service: General;  Laterality: N/A;  . LOWER EXTREMITY ANGIOGRAM Bilateral 10/20/2013   Procedure: LOWER EXTREMITY ANGIOGRAM;  Surgeon: Elam Dutch, MD;  Location[facility] CATH LAB;  Service: Cardiovascular;  Laterality: Bilateral;  . LYSIS OF ADHESION N/A 07/10/2014   Procedure: LYSIS OF ADHESION - 90 minutes;  Surgeon: [name], MD;  Location: [place];  Service: General;  Laterality: N/A;  . PORTACATH PLACEMENT  08/10/2011   Procedure:  INSERTION PORT-A-CATH;  Surgeon: Pierre Bali, MD;  Location: [place];  Service: Thoracic;  Laterality: Left;  Left Subclavian  . PTOSIS REPAIR Bilateral 12-02-2015  . THROMBECTOMY ILIAC ARTERY Bilateral 10/31/2013   Procedure: THROMBECTOMY ILIAC ARTERY;  Surgeon: Elam Dutch, MD;  Location: [place];  Service: Vascular;  Laterality: Bilateral;  . TONSILLECTOMY     as a child  . TOTAL KNEE ARTHROPLASTY Left 08/03/2017   Procedure: LEFT TOTAL KNEE ARTHROPLASTY;  Surgeon: [name], MD;  Location: [place];  Service: Orthopedics;  Laterality: Left;  Marland Kitchen VASECTOMY  70's  . VENTRAL HERNIA REPAIR N/A 04/04/2015   Procedure: OPEN REPAIR OF RECURRENT ABDOMINAL WALL HERNIA WITH MESH, COMPLEX LYSIS OF ADHESIONS X1 HR TAR COMPONENT SEPARATION;  Surgeon: Greer Pickerel, MD;  [facility];  Service: General;  Laterality: N/A;    There were no vitals filed for this visit.  Subjective Assessment - 10/21/17 1018    Subjective  PM pain at night and that helps a bit. Went to the gym yesterday.     Currently in Pain?  No/denies         [facility] Adult PT Treatment/Exercise - 10/21/17 0001      Knee/Hip Exercises: Stretches   Active Hamstring Stretch  Left;3 reps;30 seconds    Other Knee/Hip Stretches  knee to chest, knee to opposite shoulder   x2 each side       Knee/Hip Exercises: Aerobic   Stationary Bike  8 min Level 2       Knee/Hip Exercises: Machines for Strengthening   Cybex Knee Extension  20 reps, 10 lbs bilat., focus on eccentric with LLE x 10       Knee/Hip Exercises: Standing   Other Standing Knee Exercises  knee control exercises LLE on 4 inch step facing FW and facing laterally, stepping down x 10 each  used 1 UE intermittently.    Other Standing Knee Exercises  step up and over knee buckled      Knee/Hip Exercises: Seated   Long Arc Quad  Strengthening;Left;3 sets;10 reps;Weights    Long Arc Quad Weight  6 lbs.      Knee/Hip Exercises: Supine   Bridges with Diona Foley Squeeze   Strengthening;Both;1 set;10 reps    Single Leg Bridge  Strengthening;Left;1 set;10 reps      Knee/Hip Exercises: Sidelying   Hip ABduction  Strengthening;Both;1 set;10 reps    Other Sidelying Knee/Hip Exercises  hip and knee flexion side leg series x 10       Vasopneumatic   Number Minutes Vasopneumatic   15 minutes    Vasopnuematic Location   Knee    Vasopneumatic Pressure  Medium    Vasopneumatic Temperature   32  PT Education - 10/21/17 1048    Education provided  Yes    Education Details  eccentric quad control, contraction, HEP     Person(s) Educated  Patient    Methods  Explanation    Comprehension  Verbalized understanding       PT Short Term Goals - 10/12/17 1034      PT SHORT TERM GOAL #1   Title  He will be independent with initial HEp    Status  Achieved      PT SHORT TERM GOAL #2   Title  pain with activity decreased >/= 25%    Status  Achieved        PT Long Term Goals - 10/19/17 1115      PT LONG TERM GOAL #1   Title  Pt will improve FOTO score to <50% impaired to demo improvement in functional mobility.     Time  6    Period  Weeks    Status  Achieved      PT LONG TERM GOAL #2   Title  He will be independent with all HEP issued    Baseline  cues needed    Time  6    Period  Weeks    Status  On-going      PT LONG TERM GOAL #3   Title  Pt will be able to walk as needed in the community without limitation of pain.     Time  6    Period  Weeks    Status  Unable to assess      PT LONG TERM GOAL #4   Title  He will report gradual return to gym to increase fitness and lose weight    Baseline  gym not yet consistant    Time  6    Period  Weeks    Status  On-going      PT LONG TERM GOAL #5   Title  Pt will demo Lt knee AROM in extension to lacking no more than 5 deg for proper gait mechanics     Baseline  lacks 5 degrees after manual,  Consistant?    Time  6    Period  Weeks    Status  Partially Met            Plan  - 10/21/17 1059    Clinical Impression Statement  Showed patient how to work quads eccentrically at home.  02 sats 89 % on bike today but quickly went backup to 93%.  Working towards goals.      PT Next Visit Plan  gait, tolerance for walking, endurance , ROM, strength in core and hips , seated mobs? DGI? Quad strebgth.  eccentric quads.      PT Home Exercise Plan  issued HEP for knee flexibility, seated HSS, sidelying ITB stretch, gastroc stretch on stairs, heel raises on step,  step downs, wall sit,  mini squat. step downs 3 way    Consulted and Agree with Plan of Care  Patient       Patient will benefit from skilled therapeutic intervention in order to improve the following deficits and impairments:  Decreased range of motion, Difficulty walking, Increased fascial restricitons, Pain, Impaired flexibility, Hypomobility, Decreased balance, Decreased mobility, Decreased strength, Increased edema  Visit Diagnosis: Localized edema  Acute pain of left knee  Difficulty in walking, not elsewhere classified  Chronic low back pain, unspecified back pain laterality, with sciatica presence unspecified  Abnormal posture  Problem List Patient Active Problem List   Diagnosis Date Noted  . Primary osteoarthritis of left knee 08/03/2017  . S/P total knee replacement using cement, left 08/03/2017  . SBO (small bowel obstruction) ([place]) 03/18/2017  . Elevated d-dimer   . Hypoxia   . SOB (shortness of breath)   . Small bowel obstruction ([place]) 10/20/2016  . AKI (acute kidney injury) ([place]) 10/20/2016  . Acute respiratory failure with hypoxia ([place]) 10/20/2016  . Chronic pain of left knee 07/06/2016  . Port catheter in place 02/27/2016  . Claudication ([place]) 02/18/2016  . Statin intolerance 02/18/2016  . Essential hypertension 02/18/2016  . Coronary artery disease due to lipid rich plaque 08/29/2015  . Acute on chronic diastolic CHF (congestive heart failure), NYHA class 2 ([place]) 08/29/2015  .  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 Hypoxemia requiring supplemental oxygen   . Atrial fibrillation ([place]) 07/23/2014  . COPD (chronic obstructive pulmonary disease) ([place]) 07/06/2014  . Hypothyroidism 07/06/2014  . Numbness-Left Thigh 12/14/2013  . AAA- s/p Ao-Iliac BPG March 2015 10/20/2013  . Hyperlipidemia 10/09/2013  . Hepatic cirrhosis ([place]) 12/02/2012  . Cancer of right lung [facility]) 08/06/2011    PAA,JENNIFER 10/21/2017, 12:26 PM  [place] [facility] [address] [place], [place], [address] Phone: [phone]   Fax:  [phone]  Name: Joseph [name] MRN: [id number] Date of Birth: Jan 01, 1941  Raeford Razor, PT 10/21/17 12:26 PM Phone: [phone] Fax: [phone]

## 2017-10-26 ENCOUNTER — Encounter: Payer: Self-pay | Admitting: Physical Therapy

## 2017-10-26 ENCOUNTER — Ambulatory Visit: Payer: PPO | Admitting: Physical Therapy

## 2017-10-26 DIAGNOSIS — M62830 Muscle spasm of back: Secondary | ICD-10-CM

## 2017-10-26 DIAGNOSIS — R600 Localized edema: Secondary | ICD-10-CM | POA: Diagnosis not present

## 2017-10-26 DIAGNOSIS — R262 Difficulty in walking, not elsewhere classified: Secondary | ICD-10-CM

## 2017-10-26 DIAGNOSIS — M545 Low back pain: Secondary | ICD-10-CM

## 2017-10-26 DIAGNOSIS — R293 Abnormal posture: Secondary | ICD-10-CM

## 2017-10-26 DIAGNOSIS — G8929 Other chronic pain: Secondary | ICD-10-CM

## 2017-10-26 DIAGNOSIS — M25562 Pain in left knee: Secondary | ICD-10-CM

## 2017-10-26 NOTE — Therapy (Signed)
[place], [place], [address] Phone: [phone]   Fax:  [phone]  Physical Therapy Treatment  Patient Details  Name: Joseph [name] MRN: [id number] Date of Birth: 1941-01-27 Referring Provider: Dr. [name]   Encounter Date: 10/26/2017  PT End of Session - 10/26/17 1500    Visit Number  16    Number of Visits  18    Date for PT Re-Evaluation  11/02/17    PT Start Time  9233    PT Stop Time  1550    PT Time Calculation (min)  54 min    Activity Tolerance  Patient tolerated treatment well    Behavior During Therapy  [facility] for tasks assessed/performed       Past Medical History:  Diagnosis Date  . Acute on chronic diastolic CHF (congestive heart failure), NYHA class 3 ([place]) 07/23/2014  . Arthritis    left knee and back arthrtis  . Atrial fibrillation with rapid ventricular response ([place]) 07/04/2014  . CAD (coronary artery disease)   . Cataract   . Cirrhosis ([place])    By radiography, seen on CT  . Diverticulosis   . Emphysema   . Enlarged prostate   . Headache    when younger but not any longer  . Heart murmur    teen  . History of abdominal aortic aneurysm (AAA)   . History of colon polyps 2006,2008  . History of transfusion 2015  . Hx of radiation therapy 09/02/11 to 10/19/11   chest  . Hx of radiation therapy   . Hyperlipidemia    takes Pravastatin  . Hypothyroidism    takes Synthroid daily  . Inguinal hernia   . Lung cancer ([place])    right upper lobe  . Personal history of adenomatous colonic polyps 07/04/2012   2006, adenomatous right colon polyp removed, then ablated in 2007 followup, 2008, adenomatous polyp of the right colon removed with hot biopsy forceps. All by Dr. Lajoyce Corners 01/03/2013 diminutive polyp ascending colon - adenoma    . Rosacea   . Shortness of breath    with exertion  . Small bowel obstruction ([place]) 07/05/2014  . Status post chemotherapy    last chemo 2013  .  Umbilical hernia, incarcerated- surgery 07/10/14 07/05/2014    Past Surgical History:  Procedure Laterality Date  . ABDOMINAL AORTAGRAM  10/20/2013   Procedure: ABDOMINAL Maxcine Ham;  Surgeon: Elam Dutch, MD;  Location: [facility] CATH LAB;  Service: Cardiovascular;;  . ABDOMINAL AORTIC ANEURYSM REPAIR N/A 10/31/2013   Procedure: aorto to left external iliac and right external iliac and left internal iliac, reimplantation of inferior mesenteric artery and ligation of left iliac artery aneursym;  Surgeon: Elam Dutch, MD;  Location: [place];  Service: Vascular;  Laterality: N/A;  . ABDOMINAL WOUND DEHISCENCE N/A 11/10/2013   Procedure: ABDOMINAL WOUND CLOSURE ;  Surgeon: Serafina Mitchell, MD;  Location: [place];  Service: Vascular;  Laterality: N/A;  . bletharoplasty    . BOWEL RESECTION N/A 07/10/2014   Procedure: SMALL BOWEL RESECTION;  Surgeon: [name], MD;  Location: [place];  Service: General;  Laterality: N/A;  . CARDIAC CATHETERIZATION  2003  . CATARACT EXTRACTION, BILATERAL    . COLONOSCOPY W/ BIOPSIES     multiple   . ESOPHAGOGASTRODUODENOSCOPY  2006  . FINGER SURGERY  2008   left pointer finger  . HERNIA REPAIR  70's   x3  . INCISIONAL HERNIA REPAIR N/A 07/10/2014  Procedure: HERNIA REPAIR INCISIONAL;  Surgeon: [name], MD;  Location: [place];  Service: General;  Laterality: N/A;  . KNEE ARTHROSCOPY  2008   left knee  . LAPAROTOMY N/A 07/10/2014   Procedure: EXPLORATORY LAPAROTOMY;  Surgeon: [name], MD;  Location: [place];  Service: General;  Laterality: N/A;  . LOWER EXTREMITY ANGIOGRAM Bilateral 10/20/2013   Procedure: LOWER EXTREMITY ANGIOGRAM;  Surgeon: Elam Dutch, MD;  Location: [facility] CATH LAB;  Service: Cardiovascular;  Laterality: Bilateral;  . LYSIS OF ADHESION N/A 07/10/2014   Procedure: LYSIS OF ADHESION - 90 minutes;  Surgeon: [name], MD;  Location: [place];  Service: General;  Laterality: N/A;  . PORTACATH PLACEMENT  08/10/2011   Procedure:  INSERTION PORT-A-CATH;  Surgeon: Pierre Bali, MD;  Location: [place];  Service: Thoracic;  Laterality: Left;  Left Subclavian  . PTOSIS REPAIR Bilateral 12-02-2015  . THROMBECTOMY ILIAC ARTERY Bilateral 10/31/2013   Procedure: THROMBECTOMY ILIAC ARTERY;  Surgeon: Elam Dutch, MD;  Location: [place];  Service: Vascular;  Laterality: Bilateral;  . TONSILLECTOMY     as a child  . TOTAL KNEE ARTHROPLASTY Left 08/03/2017   Procedure: LEFT TOTAL KNEE ARTHROPLASTY;  Surgeon: [name], MD;  Location: [place];  Service: Orthopedics;  Laterality: Left;  Marland Kitchen VASECTOMY  70's  . VENTRAL HERNIA REPAIR N/A 04/04/2015   Procedure: OPEN REPAIR OF RECURRENT ABDOMINAL WALL HERNIA WITH MESH, COMPLEX LYSIS OF ADHESIONS X1 HR TAR COMPONENT SEPARATION;  Surgeon: Greer Pickerel, MD;  [facility];  Service: General;  Laterality: N/A;    There were no vitals filed for this visit.  Subjective Assessment - 10/26/17 1456    Subjective  Nothing strenuous today. I almost fell doing step ups Sunday.  Its so weak.  No pain right now. Saturday went to the gym.     How long can you walk comfortably?  walks through the grocery store but needs a cart     Currently in Pain?  No/denies         [facility] Adult PT Treatment/Exercise - 10/26/17 0001      Knee/Hip Exercises: Stretches   Active Hamstring Stretch  Left;2 reps    ITB Stretch  Left;2 reps    Gastroc Stretch  Both;2 reps    Soleus Stretch  Both;2 reps      Knee/Hip Exercises: Aerobic   Stationary Bike  5 min Level 2       Knee/Hip Exercises: Machines for Strengthening   Cybex Knee Extension  20 reps, 10 lbs, 1 set x 15 lbs ,  bilat., focus on eccentric with LLE x 10       Knee/Hip Exercises: Standing   Forward Lunges  Both;1 set;10 reps    Wall Squat  2 sets;10 reps    Wall Squat Limitations  used ball on the wall     Other Standing Knee Exercises  3 way SLR foot on foam.  Cues for fully extended knee .  Dyspnea x 8 reps each side (8 reps x 3  directions)       Knee/Hip Exercises: Supine   Bridges  Strengthening;Both;1 set;10 reps    Bridges with Clamshell  Strengthening;Both;1 set;10 reps    Single Leg Bridge  --    Patellar Mobs  gentle , tension lateral leg on fibular head, ITB not tight     Other Supine Knee/Hip Exercises  max cues to maintain abdominal contraction with supine clam x 10  Vasopneumatic   Number Minutes Vasopneumatic   15 minutes    Vasopnuematic Location   Knee    Vasopneumatic Pressure  Medium    Vasopneumatic Temperature   32      Manual Therapy   Joint Mobilization  Gr III extension mobs to tolerance, felt good patellar               PT Short Term Goals - 10/12/17 1034      PT SHORT TERM GOAL #1   Title  He will be independent with initial HEp    Status  Achieved      PT SHORT TERM GOAL #2   Title  pain with activity decreased >/= 25%    Status  Achieved        PT Long Term Goals - 10/19/17 1115      PT LONG TERM GOAL #1   Title  Pt will improve FOTO score to <50% impaired to demo improvement in functional mobility.     Time  6    Period  Weeks    Status  Achieved      PT LONG TERM GOAL #2   Title  He will be independent with all HEP issued    Baseline  cues needed    Time  6    Period  Weeks    Status  On-going      PT LONG TERM GOAL #3   Title  Pt will be able to walk as needed in the community without limitation of pain.     Time  6    Period  Weeks    Status  Unable to assess      PT LONG TERM GOAL #4   Title  He will report gradual return to gym to increase fitness and lose weight    Baseline  gym not yet consistant    Time  6    Period  Weeks    Status  On-going      PT LONG TERM GOAL #5   Title  Pt will demo Lt knee AROM in extension to lacking no more than 5 deg for proper gait mechanics     Baseline  lacks 5 degrees after manual,  Consistant?    Time  6    Period  Weeks    Status  Partially Met            Plan - 10/26/17 1539     Clinical Impression Statement  Pt doing well, cont to have PM pain posterolateral knee and hip.  Not limited by much pain during the day .  Doing independent gym exercises and has HEP.  Pt will be discharged soon, plateauing in progress.     PT Treatment/Interventions  ADLs/Self Care Home Management;Patient/family education;Passive range of motion;Therapeutic activities;Moist Heat;Cryotherapy;Electrical Stimulation;Gait training;Neuromuscular re-education;Manual techniques;Taping;Manual lymph drainage;Vasopneumatic Device;Therapeutic exercise;Balance training    PT Next Visit Plan  FOTO, eccentric quads, prep for DC, ROM     PT Home Exercise Plan  issued HEP for knee flexibility, seated HSS, sidelying ITB stretch, gastroc stretch on stairs, heel raises on step,  step downs, wall sit,  mini squat. step downs 3 way    Consulted and Agree with Plan of Care  Patient       Patient will benefit from skilled therapeutic intervention in order to improve the following deficits and impairments:  Decreased range of motion, Difficulty walking, Increased fascial restricitons, Pain, Impaired flexibility, Hypomobility, Decreased balance, Decreased mobility, Decreased  strength, Increased edema  Visit Diagnosis: Localized edema  Acute pain of left knee  Difficulty in walking, not elsewhere classified  Chronic low back pain, unspecified back pain laterality, with sciatica presence unspecified  Abnormal posture  Muscle spasm of back     Problem List Patient Active Problem List   Diagnosis Date Noted  . Primary osteoarthritis of left knee 08/03/2017  . S/P total knee replacement using cement, left 08/03/2017  . SBO (small bowel obstruction) ([place]) 03/18/2017  . Elevated d-dimer   . Hypoxia   . SOB (shortness of breath)   . Small bowel obstruction ([place]) 10/20/2016  . AKI (acute kidney injury) ([place]) 10/20/2016  . Acute respiratory failure with hypoxia ([place]) 10/20/2016  . Chronic pain of left knee  07/06/2016  . Port catheter in place 02/27/2016  . Claudication ([place]) 02/18/2016  . Statin intolerance 02/18/2016  . Essential hypertension 02/18/2016  . Coronary artery disease due to lipid rich plaque 08/29/2015  . Acute on chronic diastolic CHF (congestive heart failure), NYHA class 2 ([place]) 08/29/2015  . 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 Hypoxemia requiring supplemental oxygen   . Atrial fibrillation ([place]) 07/23/2014  . COPD (chronic obstructive pulmonary disease) ([place]) 07/06/2014  . Hypothyroidism 07/06/2014  . Numbness-Left Thigh 12/14/2013  . AAA- s/p Ao-Iliac BPG March 2015 10/20/2013  . Hyperlipidemia 10/09/2013  . Hepatic cirrhosis ([place]) 12/02/2012  . Cancer of right lung [facility]) 08/06/2011    Ziyan Schoon 10/26/2017, 3:44 PM  [facility] [address] [place], [place], [address] Phone: [phone]   Fax:  [phone]  Name: RUSHAWN CAPSHAW MRN: [id number] Date of Birth: April 05, 1941  Raeford Razor, PT 10/26/17 3:44 PM Phone: [phone] Fax: [phone]

## 2017-10-28 ENCOUNTER — Encounter: Payer: PPO | Admitting: Physical Therapy

## 2017-10-29 ENCOUNTER — Encounter: Payer: Self-pay | Admitting: Physical Therapy

## 2017-10-29 ENCOUNTER — Ambulatory Visit: Payer: PPO | Attending: Orthopaedic Surgery | Admitting: Physical Therapy

## 2017-10-29 DIAGNOSIS — G8929 Other chronic pain: Secondary | ICD-10-CM

## 2017-10-29 DIAGNOSIS — R262 Difficulty in walking, not elsewhere classified: Secondary | ICD-10-CM

## 2017-10-29 DIAGNOSIS — R293 Abnormal posture: Secondary | ICD-10-CM | POA: Insufficient documentation

## 2017-10-29 DIAGNOSIS — M545 Low back pain: Secondary | ICD-10-CM | POA: Diagnosis not present

## 2017-10-29 DIAGNOSIS — R600 Localized edema: Secondary | ICD-10-CM | POA: Diagnosis not present

## 2017-10-29 DIAGNOSIS — M25562 Pain in left knee: Secondary | ICD-10-CM | POA: Diagnosis not present

## 2017-10-29 NOTE — Therapy (Signed)
[place], [place], [address] Phone: [phone]   Fax:  [phone]  Physical Therapy Treatment  Patient Details  Name: Joseph [name] MRN: [id number] Date of Birth: 1941-01-11 Referring Provider: Dr. [name]   Encounter Date: 10/29/2017  PT End of Session - 10/29/17 1040    Visit Number  17    Number of Visits  18    Date for PT Re-Evaluation  11/02/17    PT Start Time  9201    PT Stop Time  1110    PT Time Calculation (min)  55 min    Activity Tolerance  Patient tolerated treatment well    Behavior During Therapy  [facility] for tasks assessed/performed       Past Medical History:  Diagnosis Date  . Acute on chronic diastolic CHF (congestive heart failure), NYHA class 3 ([place]) 07/23/2014  . Arthritis    left knee and back arthrtis  . Atrial fibrillation with rapid ventricular response ([place]) 07/04/2014  . CAD (coronary artery disease)   . Cataract   . Cirrhosis ([place])    By radiography, seen on CT  . Diverticulosis   . Emphysema   . Enlarged prostate   . Headache    when younger but not any longer  . Heart murmur    teen  . History of abdominal aortic aneurysm (AAA)   . History of colon polyps 2006,2008  . History of transfusion 2015  . Hx of radiation therapy 09/02/11 to 10/19/11   chest  . Hx of radiation therapy   . Hyperlipidemia    takes Pravastatin  . Hypothyroidism    takes Synthroid daily  . Inguinal hernia   . Lung cancer ([place])    right upper lobe  . Personal history of adenomatous colonic polyps 07/04/2012   2006, adenomatous right colon polyp removed, then ablated in 2007 followup, 2008, adenomatous polyp of the right colon removed with hot biopsy forceps. All by Dr. Lajoyce Corners 01/03/2013 diminutive polyp ascending colon - adenoma    . Rosacea   . Shortness of breath    with exertion  . Small bowel obstruction ([place]) 07/05/2014  . Status post chemotherapy    last chemo 2013  .  Umbilical hernia, incarcerated- surgery 07/10/14 07/05/2014    Past Surgical History:  Procedure Laterality Date  . ABDOMINAL AORTAGRAM  10/20/2013   Procedure: ABDOMINAL Maxcine Ham;  Surgeon: Elam Dutch, MD;  Location: [facility] CATH LAB;  Service: Cardiovascular;;  . ABDOMINAL AORTIC ANEURYSM REPAIR N/A 10/31/2013   Procedure: aorto to left external iliac and right external iliac and left internal iliac, reimplantation of inferior mesenteric artery and ligation of left iliac artery aneursym;  Surgeon: Elam Dutch, MD;  Location: [place];  Service: Vascular;  Laterality: N/A;  . ABDOMINAL WOUND DEHISCENCE N/A 11/10/2013   Procedure: ABDOMINAL WOUND CLOSURE ;  Surgeon: Serafina Mitchell, MD;  Location: [place];  Service: Vascular;  Laterality: N/A;  . bletharoplasty    . BOWEL RESECTION N/A 07/10/2014   Procedure: SMALL BOWEL RESECTION;  Surgeon: [name], MD;  Location: [place];  Service: General;  Laterality: N/A;  . CARDIAC CATHETERIZATION  2003  . CATARACT EXTRACTION, BILATERAL    . COLONOSCOPY W/ BIOPSIES     multiple   . ESOPHAGOGASTRODUODENOSCOPY  2006  . FINGER SURGERY  2008   left pointer finger  . HERNIA REPAIR  70's   x3  . INCISIONAL HERNIA REPAIR N/A 07/10/2014  Procedure: HERNIA REPAIR INCISIONAL;  Surgeon: [name], MD;  Location: [place];  Service: General;  Laterality: N/A;  . KNEE ARTHROSCOPY  2008   left knee  . LAPAROTOMY N/A 07/10/2014   Procedure: EXPLORATORY LAPAROTOMY;  Surgeon: [name], MD;  Location: [place];  Service: General;  Laterality: N/A;  . LOWER EXTREMITY ANGIOGRAM Bilateral 10/20/2013   Procedure: LOWER EXTREMITY ANGIOGRAM;  Surgeon: Elam Dutch, MD;  Location[facility] C[facility] LAB;  Service: Cardiovascular;  Laterality: Bilateral;  . LYSIS OF ADHESION N/A 07/10/2014   Procedure: LYSIS OF ADHESION - 90 minutes;  Surgeon: [name], MD;  Location: [place];  Service: General;  Laterality: N/A;  . PORTACATH PLACEMENT  08/10/2011   Procedure:  INSERTION PORT-A-CATH;  Surgeon: Pierre Bali, MD;  Location: [place];  Service: Thoracic;  Laterality: Left;  Left Subclavian  . PTOSIS REPAIR Bilateral 12-02-2015  . THROMBECTOMY ILIAC ARTERY Bilateral 10/31/2013   Procedure: THROMBECTOMY ILIAC ARTERY;  Surgeon: Elam Dutch, MD;  Location: [place];  Service: Vascular;  Laterality: Bilateral;  . TONSILLECTOMY     as a child  . TOTAL KNEE ARTHROPLASTY Left 08/03/2017   Procedure: LEFT TOTAL KNEE ARTHROPLASTY;  Surgeon: [name], MD;  Location: [place];  Service: Orthopedics;  Laterality: Left;  Marland Kitchen VASECTOMY  70's  . VENTRAL HERNIA REPAIR N/A 04/04/2015   Procedure: OPEN REPAIR OF RECURRENT ABDOMINAL WALL HERNIA WITH MESH, COMPLEX LYSIS OF ADHESIONS X1 HR TAR COMPONENT SEPARATION;  Surgeon: Greer Pickerel, MD;  [facility];  Service: General;  Laterality: N/A;    There were no vitals filed for this visit.  Subjective Assessment - 10/29/17 1026    Subjective  Did some yardwork yesterday. Knee was sore, iced it down, used icy hot.  Back was hurting from bending over.     Currently in Pain?  No/denies          [facility] Adult PT Treatment/Exercise - 10/29/17 0001      Knee/Hip Exercises: Stretches   Active Hamstring Stretch  Left;3 reps;30 seconds    Knee: Self-Stretch to increase Flexion  Left;3 reps;30 seconds      Knee/Hip Exercises: Aerobic   Stationary Bike  6 min L3       Knee/Hip Exercises: Machines for Strengthening   Cybex Leg Press  2 plates bilat.  x 30, 1 set x 10with LLE  focused on lowering phase for control       Knee/Hip Exercises: Supine   Straight Leg Raises  Strengthening;Left;2 sets;15 reps;Other (comment) 5 lbs     Other Supine Knee/Hip Exercises  used visual feedback for SLR       Knee/Hip Exercises: Sidelying   Hip ABduction  Strengthening;Left;1 set;20 reps 5 lbs       Vasopneumatic   Number Minutes Vasopneumatic   15 minutes    Vasopnuematic Location   Knee    Vasopneumatic Pressure  Medium     Vasopneumatic Temperature   32             PT Education - 10/29/17 1038    Education provided  Yes    Education Details  POC, HEP , stretching daily     Person(s) Educated  Patient    Methods  Explanation    Comprehension  Verbalized understanding       PT Short Term Goals - 10/12/17 1034      PT SHORT TERM GOAL #1   Title  He will be independent  with initial HEp    Status  Achieved      PT SHORT TERM GOAL #2   Title  pain with activity decreased >/= 25%    Status  Achieved        PT Long Term Goals - 10/19/17 1115      PT LONG TERM GOAL #1   Title  Pt will improve FOTO score to <50% impaired to demo improvement in functional mobility.     Time  6    Period  Weeks    Status  Achieved      PT LONG TERM GOAL #2   Title  He will be independent with all HEP issued    Baseline  cues needed    Time  6    Period  Weeks    Status  On-going      PT LONG TERM GOAL #3   Title  Pt will be able to walk as needed in the community without limitation of pain.     Time  6    Period  Weeks    Status  Unable to assess      PT LONG TERM GOAL #4   Title  He will report gradual return to gym to increase fitness and lose weight    Baseline  gym not yet consistant    Time  6    Period  Weeks    Status  On-going      PT LONG TERM GOAL #5   Title  Pt will demo Lt knee AROM in extension to lacking no more than 5 deg for proper gait mechanics     Baseline  lacks 5 degrees after manual,  Consistant?    Time  6    Period  Weeks    Status  Partially Met            Plan - 10/29/17 1056    Clinical Impression Statement  Ready for DC next visit.  Needs cueing for full knee extension and quad control. He knows how to manage his pain effectively.  Limited only 30% on FOTO which surpassed his predicted outcome.     PT Treatment/Interventions  ADLs/Self Care Home Management;Patient/family education;Passive range of motion;Therapeutic activities;Moist Heat;Cryotherapy;Electrical  Stimulation;Gait training;Neuromuscular re-education;Manual techniques;Taping;Manual lymph drainage;Vasopneumatic Device;Therapeutic exercise;Balance training    PT Next Visit Plan  full HEP review and wrap up, DC.  FOTO done.     PT Home Exercise Plan  issued HEP for knee flexibility, seated HSS, sidelying ITB stretch, gastroc stretch on stairs, heel raises on step,  step downs, wall sit,  mini squat. step downs 3 way    Consulted and Agree with Plan of Care  Patient       Patient will benefit from skilled therapeutic intervention in order to improve the following deficits and impairments:  Decreased range of motion, Difficulty walking, Increased fascial restricitons, Pain, Impaired flexibility, Hypomobility, Decreased balance, Decreased mobility, Decreased strength, Increased edema  Visit Diagnosis: Localized edema  Acute pain of left knee  Difficulty in walking, not elsewhere classified  Chronic low back pain, unspecified back pain laterality, with sciatica presence unspecified  Abnormal posture     Problem List Patient Active Problem List   Diagnosis Date Noted  . Primary osteoarthritis of left knee 08/03/2017  . S/P total knee replacement using cement, left 08/03/2017  . SBO (small bowel obstruction) ([place]) 03/18/2017  . Elevated d-dimer   . Hypoxia   . SOB (shortness of breath)   .  Small bowel obstruction ([place]) 10/20/2016  . AKI (acute kidney injury) ([place]) 10/20/2016  . Acute respiratory failure with hypoxia ([place]) 10/20/2016  . Chronic pain of left knee 07/06/2016  . Port catheter in place 02/27/2016  . Claudication ([place]) 02/18/2016  . Statin intolerance 02/18/2016  . Essential hypertension 02/18/2016  . Coronary artery disease due to lipid rich plaque 08/29/2015  . Acute on chronic diastolic CHF (congestive heart failure), NYHA class 2 ([place]) 08/29/2015  . 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 Hypoxemia requiring  supplemental oxygen   . Atrial fibrillation ([place]) 07/23/2014  . COPD (chronic obstructive pulmonary disease) ([place]) 07/06/2014  . Hypothyroidism 07/06/2014  . Numbness-Left Thigh 12/14/2013  . AAA- s/p Ao-Iliac BPG March 2015 10/20/2013  . Hyperlipidemia 10/09/2013  . Hepatic cirrhosis ([place]) 12/02/2012  . Cancer of right lung [facility]) 08/06/2011    Athea Haley 10/29/2017, 12:15 PM  [facility] [address] [place], [place], [address] Phone: [phone]   Fax:  [phone]  Name: Joseph [name] MRN: [id number] Date of Birth: 10/25/1940   Raeford Razor, PT 10/29/17 12:15 PM Phone: [phone] Fax: [phone]

## 2017-11-02 ENCOUNTER — Ambulatory Visit: Payer: PPO | Admitting: Physical Therapy

## 2017-11-02 ENCOUNTER — Encounter: Payer: Self-pay | Admitting: Physical Therapy

## 2017-11-02 DIAGNOSIS — R600 Localized edema: Secondary | ICD-10-CM

## 2017-11-02 DIAGNOSIS — R262 Difficulty in walking, not elsewhere classified: Secondary | ICD-10-CM

## 2017-11-02 DIAGNOSIS — M25562 Pain in left knee: Secondary | ICD-10-CM

## 2017-11-02 NOTE — Therapy (Addendum)
[place], [place], [address] Phone: [phone]   Fax:  [phone]  Physical Therapy Treatment/Discharge  Patient Details  Name: Joseph [name] MRN: [id number] Date of Birth: 1941-05-08 Referring Provider: Dr. [name]   Encounter Date: 11/02/2017    PT End of Session - 11/02/17 1236    Visit Number  18    Number of Visits  18    Date for PT Re-Evaluation  11/02/17    PT Start Time  1018    PT Stop Time  1100    PT Time Calculation (min)  42 min    Activity Tolerance  Patient tolerated treatment well    Behavior During Therapy  [facility] for tasks assessed/performed       Past Medical History:  Diagnosis Date  . Acute on chronic diastolic CHF (congestive heart failure), NYHA class 3 ([place]) 07/23/2014  . Arthritis    left knee and back arthrtis  . Atrial fibrillation with rapid ventricular response ([place]) 07/04/2014  . CAD (coronary artery disease)   . Cataract   . Cirrhosis ([place])    By radiography, seen on CT  . Diverticulosis   . Emphysema   . Enlarged prostate   . Headache    when younger but not any longer  . Heart murmur    teen  . History of abdominal aortic aneurysm (AAA)   . History of colon polyps 2006,2008  . History of transfusion 2015  . Hx of radiation therapy 09/02/11 to 10/19/11   chest  . Hx of radiation therapy   . Hyperlipidemia    takes Pravastatin  . Hypothyroidism    takes Synthroid daily  . Inguinal hernia   . Lung cancer ([place])    right upper lobe  . Personal history of adenomatous colonic polyps 07/04/2012   2006, adenomatous right colon polyp removed, then ablated in 2007 followup, 2008, adenomatous polyp of the right colon removed with hot biopsy forceps. All by Dr. Lajoyce Corners 01/03/2013 diminutive polyp ascending colon - adenoma    . Rosacea   . Shortness of breath    with exertion  . Small bowel obstruction ([place]) 07/05/2014  . Status post chemotherapy    last  chemo 2013  . Umbilical hernia, incarcerated- surgery 07/10/14 07/05/2014    Past Surgical History:  Procedure Laterality Date  . ABDOMINAL AORTAGRAM  10/20/2013   Procedure: ABDOMINAL Maxcine Ham;  Surgeon: Elam Dutch, MD;  Location: [facility] C[facility] LAB;  Service: Cardiovascular;;  . ABDOMINAL AORTIC ANEURYSM REPAIR N/A 10/31/2013   Procedure: aorto to left external iliac and right external iliac and left internal iliac, reimplantation of inferior mesenteric artery and ligation of left iliac artery aneursym;  Surgeon: Elam Dutch, MD;  Location: [place];  Service: Vascular;  Laterality: N/A;  . ABDOMINAL WOUND DEHISCENCE N/A 11/10/2013   Procedure: ABDOMINAL WOUND CLOSURE ;  Surgeon: Serafina Mitchell, MD;  Location: [place];  Service: Vascular;  Laterality: N/A;  . bletharoplasty    . BOWEL RESECTION N/A 07/10/2014   Procedure: SMALL BOWEL RESECTION;  Surgeon: [name], MD;  Location: [place];  Service: General;  Laterality: N/A;  . CARDIAC CATHETERIZATION  2003  . CATARACT EXTRACTION, BILATERAL    . COLONOSCOPY W/ BIOPSIES     multiple   . ESOPHAGOGASTRODUODENOSCOPY  2006  . FINGER SURGERY  2008   left pointer finger  . HERNIA REPAIR  70's   x3  . INCISIONAL HERNIA REPAIR N/A  07/10/2014   Procedure: HERNIA REPAIR INCISIONAL;  Surgeon: [name], MD;  Location: [place];  Service: General;  Laterality: N/A;  . KNEE ARTHROSCOPY  2008   left knee  . LAPAROTOMY N/A 07/10/2014   Procedure: EXPLORATORY LAPAROTOMY;  Surgeon: [name], MD;  Location: [place];  Service: General;  Laterality: N/A;  . LOWER EXTREMITY ANGIOGRAM Bilateral 10/20/2013   Procedure: LOWER EXTREMITY ANGIOGRAM;  Surgeon: Elam Dutch, MD;  Location: [facility] CATH LAB;  Service: Cardiovascular;  Laterality: Bilateral;  . LYSIS OF ADHESION N/A 07/10/2014   Procedure: LYSIS OF ADHESION - 90 minutes;  Surgeon: [name], MD;  Location: [place];  Service: General;  Laterality: N/A;  . PORTACATH PLACEMENT  08/10/2011    Procedure: INSERTION PORT-A-CATH;  Surgeon: Pierre Bali, MD;  Location: [place];  Service: Thoracic;  Laterality: Left;  Left Subclavian  . PTOSIS REPAIR Bilateral 12-02-2015  . THROMBECTOMY ILIAC ARTERY Bilateral 10/31/2013   Procedure: THROMBECTOMY ILIAC ARTERY;  Surgeon: Elam Dutch, MD;  Location: [place];  Service: Vascular;  Laterality: Bilateral;  . TONSILLECTOMY     as a child  . TOTAL KNEE ARTHROPLASTY Left 08/03/2017   Procedure: LEFT TOTAL KNEE ARTHROPLASTY;  Surgeon: [name], MD;  Location: [place];  Service: Orthopedics;  Laterality: Left;  Marland Kitchen VASECTOMY  70's  . VENTRAL HERNIA REPAIR N/A 04/04/2015   Procedure: OPEN REPAIR OF RECURRENT ABDOMINAL WALL HERNIA WITH MESH, COMPLEX LYSIS OF ADHESIONS X1 HR TAR COMPONENT SEPARATION;  Surgeon: Greer Pickerel, MD;  Location: [facility];  Service: General;  Laterality: N/A;    There were no vitals filed for this visit.  Subjective Assessment - 11/02/17 1224    Subjective  Today is my last day.  i have been working hard on ROM.  i tried the steps and I did them 3 X and I stopped.  (Worried too diddicult)  I understand my HEP.  I am sleeping better now.    Currently in Pain?  No/denies    Pain Location  Knee         [facility] PT Assessment - 11/02/17 0001      AROM   Left Knee Extension  -3 after manual    Left Knee Flexion  130                  [facility] Adult PT Treatment/Exercise - 11/02/17 0001      Self-Care   Other Self-Care Comments   scar care how to apply lotion with out irritating scar.        Knee/Hip Exercises: Aerobic   Stationary Bike  6 min L1      Knee/Hip Exercises: Machines for Strengthening   Cybex Leg Press  3 plates bilat.  x 30, 1 set x 10with LLE  focused on lowering phase for control       Knee/Hip Exercises: Standing   Lateral Step Up  10 reps    Forward Step Up  10 reps;Step Height: 4" slow lower.      Knee/Hip Exercises: Seated   Other Seated Knee/Hip Exercises  Discussed in length  exercise progression, suggestions, options, what to do ,  what not to do.  multiple questions answered      Cryotherapy   Number Minutes Cryotherapy  10 Minutes    Cryotherapy Location  Knee    Type of Cryotherapy  -- cold pack      Manual Therapy   Soft tissue mobilization  IT band posterior glides and soft tissue work followed by gentle PROM              PT Education - 11/02/17 1233    Education provided  Yes    Education Details  Ex suggestions,  self care    Person(s) Educated  Patient    Methods  Explanation    Comprehension  Verbalized understanding       PT Short Term Goals - 10/12/17 1034      PT SHORT TERM GOAL #1   Title  He will be independent with initial HEp    Status  Achieved      PT SHORT TERM GOAL #2   Title  pain with activity decreased >/= 25%    Status  Achieved        PT Long Term Goals - 11/02/17 1230      PT LONG TERM GOAL #1   Title  Pt will improve FOTO score to <50% impaired to demo improvement in functional mobility.     Time  6    Period  Weeks    Status  Achieved      PT LONG TERM GOAL #2   Title  He will be independent with all HEP issued    Baseline  independent    Time  6    Period  Weeks    Status  Achieved      PT LONG TERM GOAL #3   Title  Pt will be able to walk as needed in the community without limitation of pain.     Baseline  Can walk in community without pain.  Walking in community is limited by his lungs.    Time  6    Period  Weeks    Status  Achieved      PT LONG TERM GOAL #4   Title  He will report gradual return to gym to increase fitness and lose weight    Baseline  has returned to gym    Time  6    Period  Weeks    Status  Achieved      PT LONG TERM GOAL #5   Title  Pt will demo Lt knee AROM in extension to lacking no more than 5 deg for proper gait mechanics     Baseline  -3 to -5    Time  6    Period  Weeks    Status  Achieved           Plan - 11/02/17 1235    Clinical Impression  Statement  D/ C to HEP.  All goals met.  -3 to 130.  No PAIN.     PT Next Visit Plan  DISCHARGE    PT Home Exercise Plan  issued HEP for knee flexibility, seated HSS, sidelying ITB stretch, gastroc stretch on stairs, heel raises on step,  step downs, wall sit,  mini squat. step downs 3 way    Consulted and Agree with Plan of Care  Patient        Patient will benefit from skilled therapeutic intervention in order to improve the following deficits and impairments:     Visit Diagnosis: Localized edema  Acute pain of left knee  Difficulty in walking, not elsewhere classified     Problem List Patient Active Problem List   Diagnosis Date Noted  . Primary osteoarthritis of left knee 08/03/2017  . S/P total knee replacement using cement, left 08/03/2017  . SBO (  small bowel obstruction) ([place]) 03/18/2017  . Elevated d-dimer   . Hypoxia   . SOB (shortness of breath)   . Small bowel obstruction ([place]) 10/20/2016  . AKI (acute kidney injury) ([place]) 10/20/2016  . Acute respiratory failure with hypoxia ([place]) 10/20/2016  . Chronic pain of left knee 07/06/2016  . Port catheter in place 02/27/2016  . Claudication ([place]) 02/18/2016  . Statin intolerance 02/18/2016  . Essential hypertension 02/18/2016  . Coronary artery disease due to lipid rich plaque 08/29/2015  . Acute on chronic diastolic CHF (congestive heart failure), NYHA class 2 ([place]) 08/29/2015  . 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 Hypoxemia requiring supplemental oxygen   . Atrial fibrillation ([place]) 07/23/2014  . COPD (chronic obstructive pulmonary disease) ([place]) 07/06/2014  . Hypothyroidism 07/06/2014  . Numbness-Left Thigh 12/14/2013  . AAA- s/p Ao-Iliac BPG March 2015 10/20/2013  . Hyperlipidemia 10/09/2013  . Hepatic cirrhosis ([place]) 12/02/2012  . Cancer of right lung ([place]) 08/06/2011    Kaizley Aja  PTA 11/02/2017, 12:34 PM  [place]  [facility] [address] [place], [place], [address] Phone: [phone]   Fax:  [phone]  Name: DAKAI BRAITHWAITE MRN: [id number] Date of Birth: 06/26/1941  PHYSICAL THERAPY DISCHARGE SUMMARY  Visits from Start of Care: 18  Current functional level related to goals / functional outcomes: See above for current function   Remaining deficits: AROM but much improved.  Has mild swelling.  Some back pain , hip weakness   Education / Equipment: HEP, gait, safety at the gym  Plan: Patient agrees to discharge.  Patient goals were met. Patient is being discharged due to meeting the stated rehab goals.  ?????    Raeford Razor, PT 11/03/17 3:31 PM Phone: [phone] Fax: [phone]

## 2017-11-03 DIAGNOSIS — H31012 Macula scars of posterior pole (postinflammatory) (post-traumatic), left eye: Secondary | ICD-10-CM | POA: Diagnosis not present

## 2017-11-03 DIAGNOSIS — H35033 Hypertensive retinopathy, bilateral: Secondary | ICD-10-CM | POA: Diagnosis not present

## 2017-11-03 DIAGNOSIS — H5310 Unspecified subjective visual disturbances: Secondary | ICD-10-CM | POA: Diagnosis not present

## 2017-11-03 DIAGNOSIS — H353122 Nonexudative age-related macular degeneration, left eye, intermediate dry stage: Secondary | ICD-10-CM | POA: Diagnosis not present

## 2017-11-03 IMAGING — US US ABDOMEN COMPLETE
1 series · 13 of 25 positions shown · non-contrast
Comparison: CT, 11/08/2014

CLINICAL DATA: Elevated transaminase level 4PL.R (7QM-YK-CM)
Hepatic cirrhosis, unspecified hepatic cirrhosis type (HCC)
(7QM-YK-CM)
Abdominal pain, RUQ QKO.KK (7QM-YK-CM)

EXAM:
ABDOMEN ULTRASOUND COMPLETE

[Series 1: us abdomen complete · 0.24mm/px · 13 of 74 slices shown]
[im 1/74]
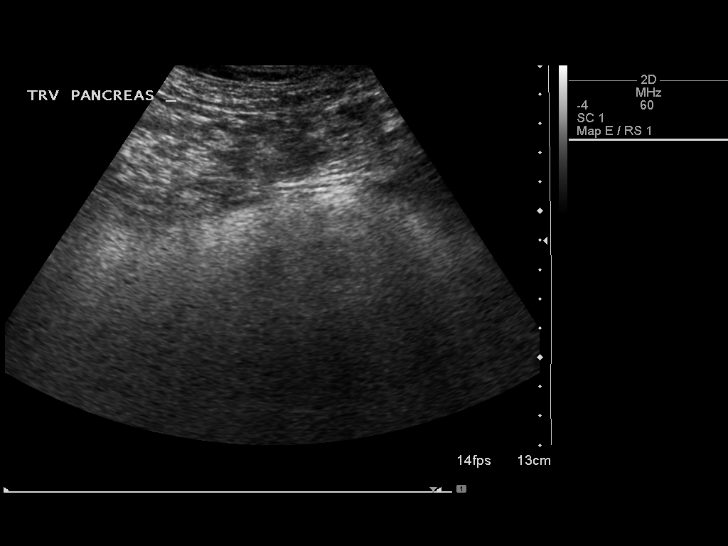
[im 7/74]
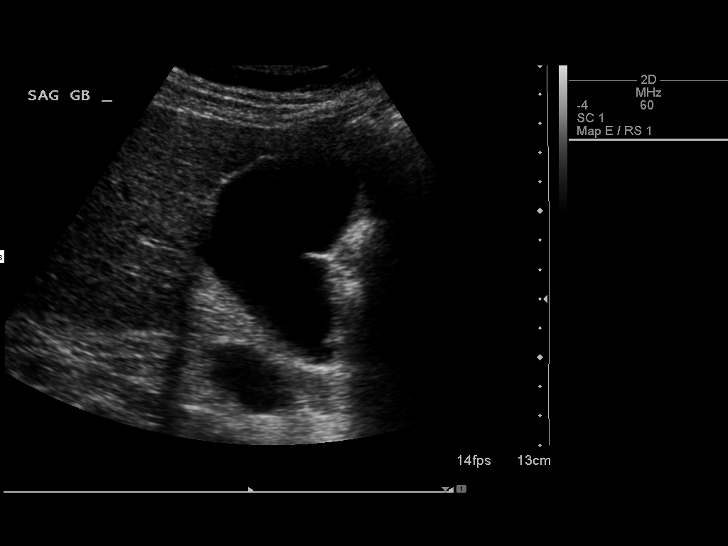
[im 13/74]
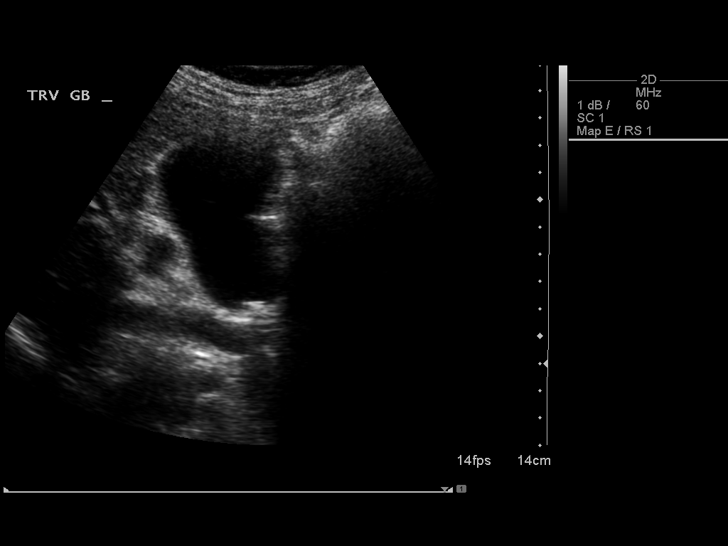
[im 19/74]
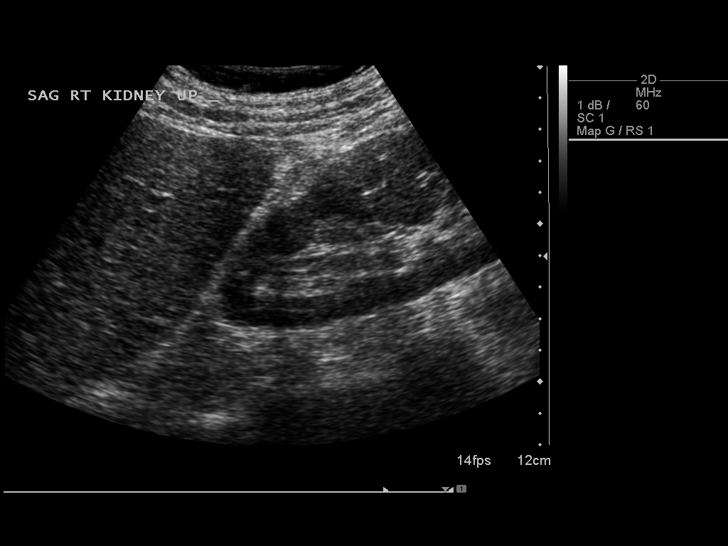
[im 25/74]
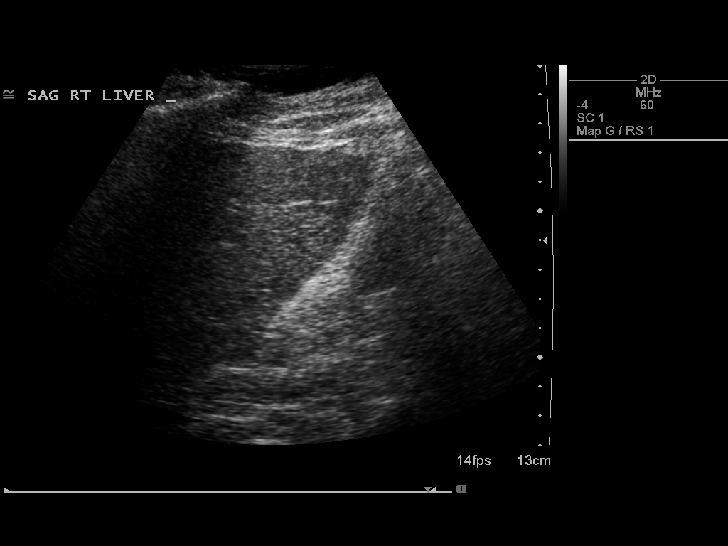
[im 31/74]
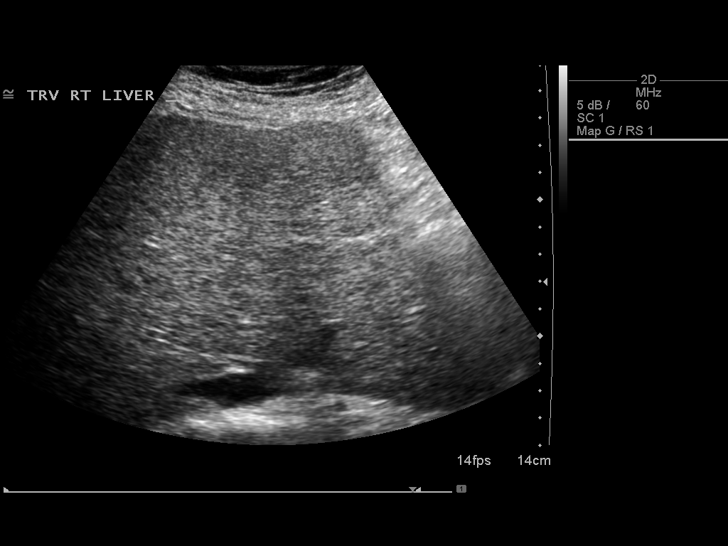
[im 37/74]
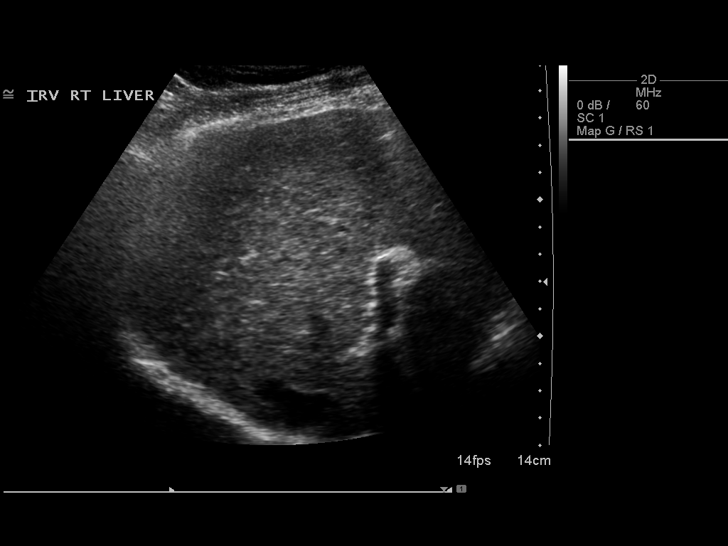
[im 43/74]
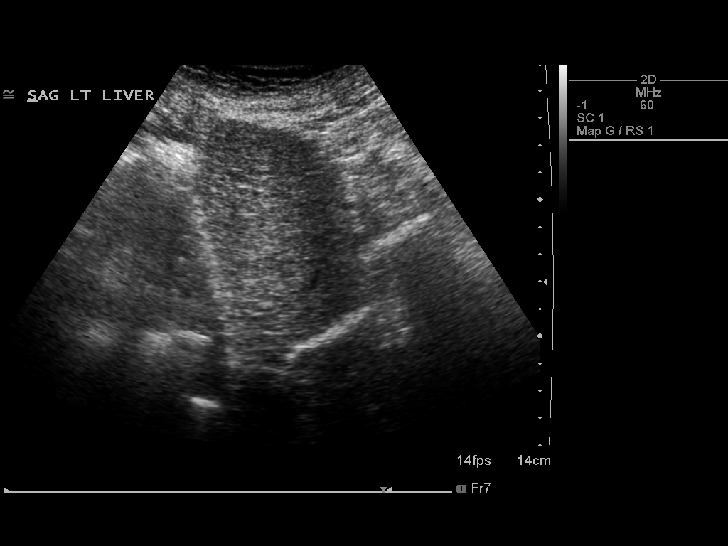
[im 49/74]
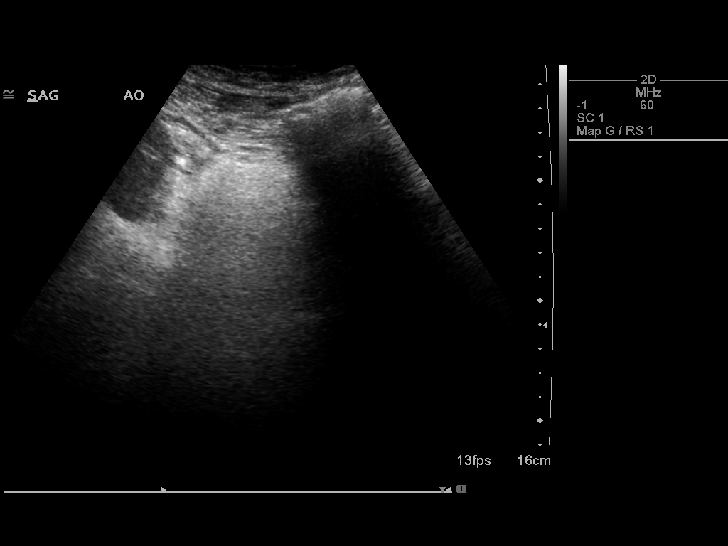
[im 55/74]
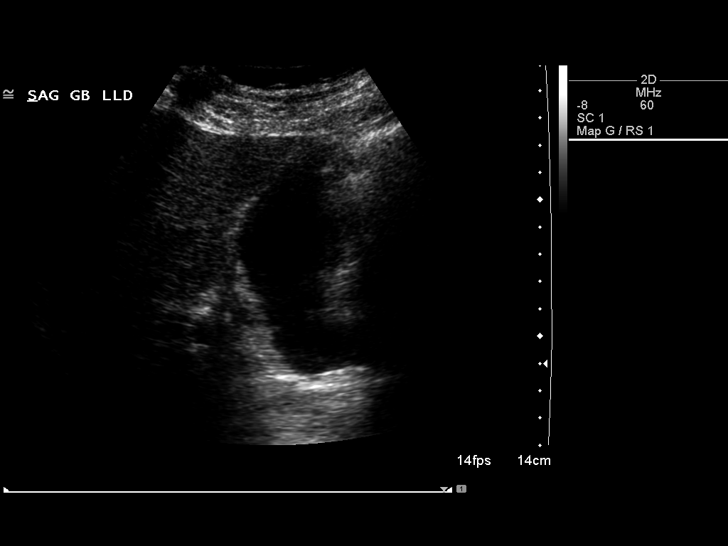
[im 61/74]
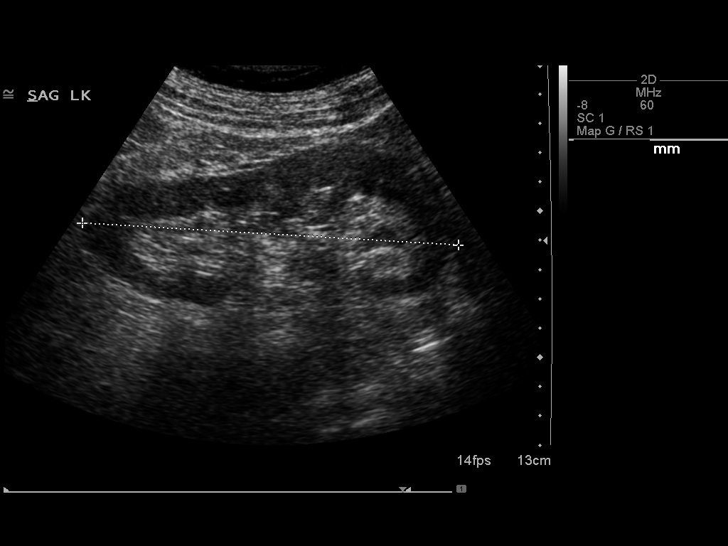
[im 67/74]
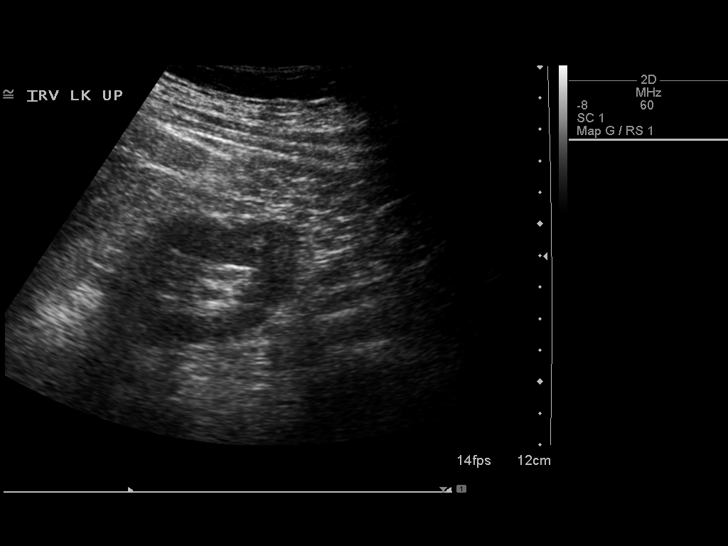
[im 74/74]
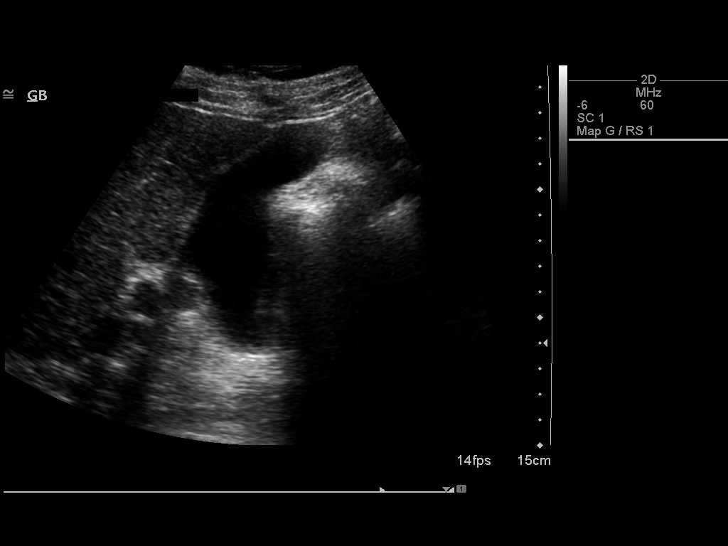

[13 of 25 positions shown; findings below may reference images not displayed]

FINDINGS: Gallbladder: Small mobile gallstone measuring 6 mm. No wall
thickening. No pericholecystic fluid.

Common bile duct: Diameter: 3.2 mm

Liver: Coarsened, heterogeneous echotexture with mild increased
echogenicity. No discrete mass or focal lesion.

IVC: No abnormality visualized.

Pancreas: Not well visualized.  No gross abnormality.

Spleen: Size and appearance within normal limits.

Right Kidney: Length: 11.8 cm. Echogenicity within normal limits. No
mass or hydronephrosis visualized.

Left Kidney: Length: 12.9 cm. Echogenicity within normal limits. No
mass or hydronephrosis visualized.

Abdominal aorta: Ectatic. Maximum AP diameter of 3 cm.
Atherosclerotic irregularity.

Other findings: None.
IMPRESSION: 1. No acute findings.
2. Single gallstone.  No acute cholecystitis.
3. Appearance of the liver is consistent with the given diagnosis of
cirrhosis. There may be an element of hepatic steatosis. No liver
mass is seen.
4. Ectatic abdominal aorta measuring 3 cm in greatest
anterior-posterior dimension, stable from the prior CT. Recommend
followup by ultrasound in 3 years. This recommendation follows ACR
consensus guidelines: White Paper of the ACR Incidental Findings

## 2017-11-12 ENCOUNTER — Inpatient Hospital Stay: Payer: PPO | Attending: Internal Medicine

## 2017-11-12 DIAGNOSIS — Z452 Encounter for adjustment and management of vascular access device: Secondary | ICD-10-CM | POA: Diagnosis not present

## 2017-11-12 DIAGNOSIS — C3411 Malignant neoplasm of upper lobe, right bronchus or lung: Secondary | ICD-10-CM | POA: Diagnosis not present

## 2017-11-12 MED ORDER — SODIUM CHLORIDE 0.9% FLUSH
10.0000 mL | Freq: Once | INTRAVENOUS | Status: AC
  Administered 2017-11-12: 10 mL via INTRAVENOUS
  Filled 2017-11-12: qty 10

## 2017-11-12 MED ORDER — HEPARIN SOD (PORK) LOCK FLUSH 100 UNIT/ML IV SOLN
500.0000 [IU] | Freq: Once | INTRAVENOUS | Status: AC
  Administered 2017-11-12: 500 [IU] via INTRAVENOUS
  Filled 2017-11-12: qty 5

## 2017-11-12 NOTE — Patient Instructions (Signed)

## 2017-11-18 ENCOUNTER — Encounter: Payer: Self-pay | Admitting: Cardiology

## 2017-11-18 DIAGNOSIS — E039 Hypothyroidism, unspecified: Secondary | ICD-10-CM | POA: Diagnosis not present

## 2017-11-18 DIAGNOSIS — R82998 Other abnormal findings in urine: Secondary | ICD-10-CM | POA: Diagnosis not present

## 2017-11-18 DIAGNOSIS — I10 Essential (primary) hypertension: Secondary | ICD-10-CM | POA: Diagnosis not present

## 2017-11-18 DIAGNOSIS — Z125 Encounter for screening for malignant neoplasm of prostate: Secondary | ICD-10-CM | POA: Diagnosis not present

## 2017-11-18 DIAGNOSIS — E785 Hyperlipidemia, unspecified: Secondary | ICD-10-CM | POA: Diagnosis not present

## 2017-11-25 DIAGNOSIS — Z0000 Encounter for general adult medical examination without abnormal findings: Secondary | ICD-10-CM | POA: Diagnosis not present

## 2017-11-25 DIAGNOSIS — Z1389 Encounter for screening for other disorder: Secondary | ICD-10-CM | POA: Diagnosis not present

## 2017-11-25 DIAGNOSIS — I5032 Chronic diastolic (congestive) heart failure: Secondary | ICD-10-CM | POA: Diagnosis not present

## 2017-11-25 DIAGNOSIS — E7849 Other hyperlipidemia: Secondary | ICD-10-CM | POA: Diagnosis not present

## 2017-11-25 DIAGNOSIS — K7460 Unspecified cirrhosis of liver: Secondary | ICD-10-CM | POA: Diagnosis not present

## 2017-11-25 DIAGNOSIS — D696 Thrombocytopenia, unspecified: Secondary | ICD-10-CM | POA: Diagnosis not present

## 2017-11-25 DIAGNOSIS — Z23 Encounter for immunization: Secondary | ICD-10-CM | POA: Diagnosis not present

## 2017-11-25 DIAGNOSIS — I10 Essential (primary) hypertension: Secondary | ICD-10-CM | POA: Diagnosis not present

## 2017-11-25 DIAGNOSIS — J449 Chronic obstructive pulmonary disease, unspecified: Secondary | ICD-10-CM | POA: Diagnosis not present

## 2017-11-25 DIAGNOSIS — I700 Atherosclerosis of aorta: Secondary | ICD-10-CM | POA: Diagnosis not present

## 2017-11-25 DIAGNOSIS — E038 Other specified hypothyroidism: Secondary | ICD-10-CM | POA: Diagnosis not present

## 2017-11-25 DIAGNOSIS — I480 Paroxysmal atrial fibrillation: Secondary | ICD-10-CM | POA: Diagnosis not present

## 2017-11-26 ENCOUNTER — Other Ambulatory Visit (HOSPITAL_COMMUNITY): Payer: Self-pay | Admitting: Internal Medicine

## 2017-11-26 DIAGNOSIS — I714 Abdominal aortic aneurysm, without rupture, unspecified: Secondary | ICD-10-CM

## 2017-11-29 ENCOUNTER — Ambulatory Visit (HOSPITAL_COMMUNITY)
Admission: RE | Admit: 2017-11-29 | Discharge: 2017-11-29 | Disposition: A | Discharge status: Home / Self Care | Payer: PPO | Source: Ambulatory Visit | Attending: Surgery | Admitting: Surgery

## 2017-11-29 DIAGNOSIS — E669 Obesity, unspecified: Secondary | ICD-10-CM | POA: Insufficient documentation

## 2017-11-29 DIAGNOSIS — I714 Abdominal aortic aneurysm, without rupture, unspecified: Secondary | ICD-10-CM

## 2017-12-23 ENCOUNTER — Encounter: Payer: Self-pay | Admitting: Internal Medicine

## 2018-01-03 ENCOUNTER — Encounter (INDEPENDENT_AMBULATORY_CARE_PROVIDER_SITE_OTHER): Payer: Self-pay | Admitting: Orthopaedic Surgery

## 2018-01-03 ENCOUNTER — Ambulatory Visit (INDEPENDENT_AMBULATORY_CARE_PROVIDER_SITE_OTHER): Payer: PPO | Admitting: Orthopaedic Surgery

## 2018-01-03 VITALS — BP 123/78 | HR 82 | Resp 18 | Ht 72.0 in | Wt 208.0 lb

## 2018-01-03 DIAGNOSIS — Z96652 Presence of left artificial knee joint: Secondary | ICD-10-CM

## 2018-01-03 NOTE — Progress Notes (Signed)
Office Visit Note   Patient: Joseph [name]           Date of Birth: Jan 27, 1941           MRN: [id number] Visit Date: 01/03/2018              Requested by: [name], MD [address] [place], [place] [address] PCP: [name], MD   Assessment & Plan: Visit Diagnoses:  1. History of left knee replacement     Plan: 5 months status post primary left total knee replacement.  Doing very well.  Does have some thigh weakness.  I have encouraged strengthening exercises.  Office 6 months  Follow-Up Instructions: Return in about 6 months (around 07/06/2018).   Orders:  No orders of the defined types were placed in this encounter.  No orders of the defined types were placed in this encounter.     Procedures: No procedures performed   Clinical Data: No additional findings.   Subjective: Chief Complaint  Patient presents with  . Left Knee - Follow-up  . Follow-up    08-03-17 L KNEE REPLACEMENT, STILL SORE ON LATERAL SIDE  5 months status post primary right total knee replacement.  No related fever chills shortness of breath or chest pain.  Very minimal discomfort.  Some achiness at night.  HPI  Review of Systems  Constitutional: Negative for fatigue and fever.  HENT: Negative for ear pain.   Eyes: Negative for pain.  Respiratory: Positive for shortness of breath. Negative for cough.   Cardiovascular: Negative for leg swelling.  Gastrointestinal: Positive for constipation. Negative for diarrhea.  Genitourinary: Negative for difficulty urinating.  Musculoskeletal: Positive for back pain. Negative for neck pain.  Skin: Negative for rash.  Allergic/Immunologic: Negative for food allergies.  Neurological: Positive for weakness. Negative for numbness.  Hematological: Does not bruise/bleed easily.  Psychiatric/Behavioral: Positive for sleep disturbance.     Objective: Vital Signs: BP 123/78 (BP Location: Left Arm, Patient Position: Sitting, Cuff Size: Normal)    Pulse 82   Resp 18   Ht 6' (1.829 m)   Wt 208 lb (94.3 kg)   BMI 28.21 kg/m   Physical Exam  Constitutional: He is oriented to person, place, and time. He appears well-developed and well-nourished.  HENT:  Mouth/Throat: Oropharynx is clear and moist.  Eyes: Pupils are equal, round, and reactive to light. EOM are normal.  Pulmonary/Chest: Effort normal.  Neurological: He is alert and oriented to person, place, and time.  Skin: Skin is warm and dry.  Psychiatric: He has a normal mood and affect. His behavior is normal.    Ortho Exam awake alert and oriented x3.  Comfortable sitting semination of the left knee reveals no effusion.  Full quick extension 120 degrees of flexion with a goniometer.  No instability.  Left thigh is at least an inch less and circumference than the right consistent with weakness.  No distal edema.  Good pulses distally.  Motor exam intact.  Specialty Comments:  No specialty comments available.  Imaging: No results found.   PMFS History: Patient Active Problem List   Diagnosis Date Noted  . Primary osteoarthritis of left knee 08/03/2017  . S/P total knee replacement using cement, left 08/03/2017  . SBO (small bowel obstruction) ([place]) 03/18/2017  . Elevated d-dimer   . Hypoxia   . SOB (shortness of breath)   . Small bowel obstruction ([place]) 10/20/2016  . AKI (acute kidney injury) ([place]) 10/20/2016  . Acute respiratory failure with  hypoxia ([place]) 10/20/2016  . Chronic pain of left knee 07/06/2016  . Port catheter in place 02/27/2016  . Claudication ([place]) 02/18/2016  . Statin intolerance 02/18/2016  . Essential hypertension 02/18/2016  . Coronary artery disease due to lipid rich plaque 08/29/2015  . Acute on chronic diastolic CHF (congestive heart failure), NYHA class 2 ([place]) 08/29/2015  . 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 Hypoxemia requiring supplemental oxygen   . Atrial fibrillation ([place])  07/23/2014  . COPD (chronic obstructive pulmonary disease) ([place]) 07/06/2014  . Hypothyroidism 07/06/2014  . Numbness-Left Thigh 12/14/2013  . AAA- s/p Ao-Iliac BPG March 2015 10/20/2013  . Hyperlipidemia 10/09/2013  . Hepatic cirrhosis ([place]) 12/02/2012  . Cancer of right lung ([place]) 08/06/2011   Past Medical History:  Diagnosis Date  . Acute on chronic diastolic CHF (congestive heart failure), NYHA class 3 ([place]) 07/23/2014  . Arthritis    left knee and back arthrtis  . Atrial fibrillation with rapid ventricular response ([place]) 07/04/2014  . CAD (coronary artery disease)   . Cataract   . Cirrhosis ([place])    By radiography, seen on CT  . Diverticulosis   . Emphysema   . Enlarged prostate   . Headache    when younger but not any longer  . Heart murmur    teen  . History of abdominal aortic aneurysm (AAA)   . History of colon polyps 2006,2008  . History of transfusion 2015  . Hx of radiation therapy 09/02/11 to 10/19/11   chest  . Hx of radiation therapy   . Hyperlipidemia    takes Pravastatin  . Hypothyroidism    takes Synthroid daily  . Inguinal hernia   . Lung cancer ([place])    right upper lobe  . Personal history of adenomatous colonic polyps 07/04/2012   2006, adenomatous right colon polyp removed, then ablated in 2007 followup, 2008, adenomatous polyp of the right colon removed with hot biopsy forceps. All by Dr. Lajoyce Corners 01/03/2013 diminutive polyp ascending colon - adenoma    . Rosacea   . Shortness of breath    with exertion  . Small bowel obstruction ([place]) 07/05/2014  . Status post chemotherapy    last chemo 2013  . Umbilical hernia, incarcerated- surgery 07/10/14 07/05/2014    Family History  Problem Relation Age of Onset  . Cancer Father   . Diabetes Father   . Hyperlipidemia Mother   . Hypertension Mother   . Hyperlipidemia Brother   . AAA (abdominal aortic aneurysm) Brother   . Hypertension Brother   . Hyperlipidemia Sister   . Hypertension Sister   . Anesthesia  problems Neg Hx   . Hypotension Neg Hx   . Malignant hyperthermia Neg Hx   . Pseudochol deficiency Neg Hx   . Colon cancer Neg Hx     Past Surgical History:  Procedure Laterality Date  . ABDOMINAL AORTAGRAM  10/20/2013   Procedure: ABDOMINAL Maxcine Ham;  Surgeon: Elam Dutch, MD;  Location: [facility] CATH LAB;  Service: Cardiovascular;;  . ABDOMINAL AORTIC ANEURYSM REPAIR N/A 10/31/2013   Procedure: aorto to left external iliac and right external iliac and left internal iliac, reimplantation of inferior mesenteric artery and ligation of left iliac artery aneursym;  Surgeon: Elam Dutch, MD;  Location: [place];  Service: Vascular;  Laterality: N/A;  . ABDOMINAL WOUND DEHISCENCE N/A 11/10/2013   Procedure: ABDOMINAL WOUND CLOSURE ;  Surgeon: Serafina Mitchell, MD;  Location: [place];  Service:  Vascular;  Laterality: N/A;  . bletharoplasty    . BOWEL RESECTION N/A 07/10/2014   Procedure: SMALL BOWEL RESECTION;  Surgeon: [name], MD;  Location: [place];  Service: General;  Laterality: N/A;  . CARDIAC CATHETERIZATION  2003  . CATARACT EXTRACTION, BILATERAL    . COLONOSCOPY W/ BIOPSIES     multiple   . ESOPHAGOGASTRODUODENOSCOPY  2006  . FINGER SURGERY  2008   left pointer finger  . HERNIA REPAIR  70's   x3  . INCISIONAL HERNIA REPAIR N/A 07/10/2014   Procedure: HERNIA REPAIR INCISIONAL;  Surgeon: [name], MD;  Location: [place];  Service: General;  Laterality: N/A;  . KNEE ARTHROSCOPY  2008   left knee  . LAPAROTOMY N/A 07/10/2014   Procedure: EXPLORATORY LAPAROTOMY;  Surgeon: [name], MD;  Location: [place];  Service: General;  Laterality: N/A;  . LOWER EXTREMITY ANGIOGRAM Bilateral 10/20/2013   Procedure: LOWER EXTREMITY ANGIOGRAM;  Surgeon: Elam Dutch, MD;  Location: [facility] CATH LAB;  Service: Cardiovascular;  Laterality: Bilateral;  . LYSIS OF ADHESION N/A 07/10/2014   Procedure: LYSIS OF ADHESION - 90 minutes;  Surgeon: [name], MD;  Location: [place];  Service: General;   Laterality: N/A;  . PORTACATH PLACEMENT  08/10/2011   Procedure: INSERTION PORT-A-CATH;  Surgeon: Pierre Bali, MD;  Location: [place];  Service: Thoracic;  Laterality: Left;  Left Subclavian  . PTOSIS REPAIR Bilateral 12-02-2015  . THROMBECTOMY ILIAC ARTERY Bilateral 10/31/2013   Procedure: THROMBECTOMY ILIAC ARTERY;  Surgeon: Elam Dutch, MD;  Location: [place];  Service: Vascular;  Laterality: Bilateral;  . TONSILLECTOMY     as a child  . TOTAL KNEE ARTHROPLASTY Left 08/03/2017   Procedure: LEFT TOTAL KNEE ARTHROPLASTY;  Surgeon: [name], MD;  Location: [place];  Service: Orthopedics;  Laterality: Left;  Marland Kitchen VASECTOMY  70's  . VENTRAL HERNIA REPAIR N/A 04/04/2015   Procedure: OPEN REPAIR OF RECURRENT ABDOMINAL WALL HERNIA WITH MESH, COMPLEX LYSIS OF ADHESIONS X1 HR TAR COMPONENT SEPARATION;  Surgeon: Greer Pickerel, MD;  Location: [facility];  Service: General;  Laterality: N/A;   Social History   Occupational History  . Not on file  Tobacco Use  . Smoking status: Former Smoker    Packs/day: 1.50    Years: 40.00    Pack years: 60.00    Types: Cigarettes    Last attempt to quit: 07/02/2011    Years since quitting: 6.5  . Smokeless tobacco: Former Network engineer and Sexual Activity  . Alcohol use: Yes    Comment: social - 2 MONTHLY  . Drug use: No  . Sexual activity: Never

## 2018-01-07 ENCOUNTER — Inpatient Hospital Stay: Payer: PPO

## 2018-01-07 ENCOUNTER — Inpatient Hospital Stay: Payer: PPO | Attending: Internal Medicine

## 2018-01-07 ENCOUNTER — Ambulatory Visit (HOSPITAL_COMMUNITY)
Admission: RE | Admit: 2018-01-07 | Discharge: 2018-01-07 | Disposition: A | Discharge status: Home / Self Care | Payer: PPO | Source: Ambulatory Visit | Attending: Internal Medicine | Admitting: Internal Medicine

## 2018-01-07 DIAGNOSIS — Z79899 Other long term (current) drug therapy: Secondary | ICD-10-CM | POA: Diagnosis not present

## 2018-01-07 DIAGNOSIS — C3490 Malignant neoplasm of unspecified part of unspecified bronchus or lung: Secondary | ICD-10-CM | POA: Diagnosis not present

## 2018-01-07 DIAGNOSIS — I2510 Atherosclerotic heart disease of native coronary artery without angina pectoris: Secondary | ICD-10-CM | POA: Insufficient documentation

## 2018-01-07 DIAGNOSIS — I700 Atherosclerosis of aorta: Secondary | ICD-10-CM | POA: Diagnosis not present

## 2018-01-07 DIAGNOSIS — Z923 Personal history of irradiation: Secondary | ICD-10-CM | POA: Insufficient documentation

## 2018-01-07 DIAGNOSIS — C3491 Malignant neoplasm of unspecified part of right bronchus or lung: Secondary | ICD-10-CM

## 2018-01-07 DIAGNOSIS — Z95828 Presence of other vascular implants and grafts: Secondary | ICD-10-CM

## 2018-01-07 DIAGNOSIS — Z9221 Personal history of antineoplastic chemotherapy: Secondary | ICD-10-CM | POA: Diagnosis not present

## 2018-01-07 DIAGNOSIS — E039 Hypothyroidism, unspecified: Secondary | ICD-10-CM | POA: Insufficient documentation

## 2018-01-07 DIAGNOSIS — K7460 Unspecified cirrhosis of liver: Secondary | ICD-10-CM | POA: Diagnosis not present

## 2018-01-07 DIAGNOSIS — J439 Emphysema, unspecified: Secondary | ICD-10-CM | POA: Diagnosis not present

## 2018-01-07 DIAGNOSIS — C3411 Malignant neoplasm of upper lobe, right bronchus or lung: Secondary | ICD-10-CM | POA: Diagnosis not present

## 2018-01-07 LAB — COMPREHENSIVE METABOLIC PANEL
ALBUMIN: 3.4 g/dL — AB (ref 3.5–5.0)
ALT: 26 U/L (ref 0–55)
AST: 32 U/L (ref 5–34)
Alkaline Phosphatase: 152 U/L — ABNORMAL HIGH (ref 40–150)
Anion gap: 6 (ref 3–11)
BUN: 13 mg/dL (ref 7–26)
CHLORIDE: 110 mmol/L — AB (ref 98–109)
CO2: 23 mmol/L (ref 22–29)
CREATININE: 0.9 mg/dL (ref 0.70–1.30)
Calcium: 8.7 mg/dL (ref 8.4–10.4)
GFR calc non Af Amer: >60 mL/min (ref >60)
Glucose, Bld: 85 mg/dL (ref 70–140)
Potassium: 4.4 mmol/L (ref 3.5–5.1)
Sodium: 139 mmol/L (ref 136–145)
Total Bilirubin: 1.1 mg/dL (ref 0.2–1.2)
Total Protein: 6.8 g/dL (ref 6.4–8.3)

## 2018-01-07 LAB — CBC WITH DIFFERENTIAL/PLATELET
BASOS ABS: 0 10*3/uL (ref 0.0–0.1)
Basophils Relative: 0 %
EOS PCT: 3 %
Eosinophils Absolute: 0.1 10*3/uL (ref 0.0–0.5)
HCT: 43.9 % (ref 38.4–49.9)
Hemoglobin: 14.8 g/dL (ref 13.0–17.1)
Lymphocytes Relative: 35 %
Lymphs Abs: 1.6 10*3/uL (ref 0.9–3.3)
MCH: 32.9 pg (ref 27.2–33.4)
MCHC: 33.6 g/dL (ref 32.0–36.0)
MCV: 97.9 fL (ref 79.3–98.0)
Monocytes Absolute: 0.4 10*3/uL (ref 0.1–0.9)
Monocytes Relative: 9 %
Neutro Abs: 2.3 10*3/uL (ref 1.5–6.5)
Neutrophils Relative %: 53 %
PLATELETS: 113 10*3/uL — AB (ref 140–400)
RBC: 4.49 MIL/uL (ref 4.20–5.82)
RDW: 14.3 % (ref 11.0–14.6)
WBC: 4.5 10*3/uL (ref 4.0–10.3)

## 2018-01-07 MED ORDER — HEPARIN SOD (PORK) LOCK FLUSH 100 UNIT/ML IV SOLN
INTRAVENOUS | Status: AC
  Filled 2018-01-07: qty 5

## 2018-01-07 MED ORDER — SODIUM CHLORIDE 0.9 % IJ SOLN
10.0000 mL | INTRAMUSCULAR | Status: DC | PRN
  Administered 2018-01-07: 10 mL via INTRAVENOUS
  Filled 2018-01-07: qty 10

## 2018-01-07 MED ORDER — IOHEXOL 300 MG/ML  SOLN
75.0000 mL | Freq: Once | INTRAMUSCULAR | Status: AC | PRN
  Administered 2018-01-07: 75 mL via INTRAVENOUS

## 2018-01-07 MED ORDER — HEPARIN SOD (PORK) LOCK FLUSH 100 UNIT/ML IV SOLN
500.0000 [IU] | Freq: Once | INTRAVENOUS | Status: AC
  Administered 2018-01-07: 500 [IU] via INTRAVENOUS

## 2018-01-10 ENCOUNTER — Inpatient Hospital Stay (HOSPITAL_BASED_OUTPATIENT_CLINIC_OR_DEPARTMENT_OTHER): Payer: PPO | Admitting: Internal Medicine

## 2018-01-10 ENCOUNTER — Encounter: Payer: Self-pay | Admitting: *Deleted

## 2018-01-10 ENCOUNTER — Encounter: Payer: Self-pay | Admitting: Internal Medicine

## 2018-01-10 ENCOUNTER — Telehealth: Payer: Self-pay | Admitting: Internal Medicine

## 2018-01-10 DIAGNOSIS — C3490 Malignant neoplasm of unspecified part of unspecified bronchus or lung: Secondary | ICD-10-CM

## 2018-01-10 DIAGNOSIS — C3491 Malignant neoplasm of unspecified part of right bronchus or lung: Secondary | ICD-10-CM

## 2018-01-10 DIAGNOSIS — C3411 Malignant neoplasm of upper lobe, right bronchus or lung: Secondary | ICD-10-CM | POA: Diagnosis not present

## 2018-01-10 NOTE — Telephone Encounter (Signed)
Scheduled appt per 5/13 los - Gave patient aVS and calender per los. Central radiology to contact patient with ct scan.

## 2018-01-10 NOTE — Progress Notes (Signed)
Oncology Nurse Navigator Documentation  Oncology Nurse Navigator Flowsheets 01/10/2018  Navigator Location [facility]  Navigator Encounter Type Other/per Dr. Julien Nordmann, placed referral to [facility] for port removal.    Treatment Phase Follow-up  Barriers/Navigation Needs Coordination of Care  Interventions Coordination of Care  Coordination of Care Other  Acuity Level 1  Time Spent with Patient 15

## 2018-01-10 NOTE — Progress Notes (Signed)
[facility] [phone]   [phone]  OFFICE PROGRESS NOTE  Joseph Redwood, MD [place] [address]  DIAGNOSIS: Stage IIIA/IIIB non-small cell lung cancer diagnosed in December 2012.  PRIOR THERAPY:  1) Concurrent chemoradiation with chemotherapy in the form of weekly carboplatin for an AUC of 2 and paclitaxel at 45 mg/m2.  2) Consolidation chemotherapy with carboplatin for AUC of 5 and paclitaxel 175 mg/M2 every 3 weeks with Neulasta support, status post 3 cycles, last cycle was given on 01/05/2012 with partial response.   CURRENT THERAPY: Observation.  INTERVAL HISTORY: Joseph [name] 77 y.o. male returns to the clinic today for annual follow-up visit accompanied by his wife.  The patient is feeling fine today with no concerning complaints.  He denied having any recent chest pain, shortness of breath, cough or hemoptysis.  He has no nausea, vomiting, diarrhea or constipation.  He denied having any weight loss or night sweats.  He has been in observation since 2013 and has been doing very well.  He had repeat CT scan of the chest performed recently and he is here for evaluation and discussion of his discuss results.  MEDICAL HISTORY: Past Medical History:  Diagnosis Date  . Acute on chronic diastolic CHF (congestive heart failure), NYHA class 3 ([place]) 07/23/2014  . Arthritis    left knee and back arthrtis  . Atrial fibrillation with rapid ventricular response ([place]) 07/04/2014  . CAD (coronary artery disease)   . Cataract   . Cirrhosis ([place])    By radiography, seen on CT  . Diverticulosis   . Emphysema   . Enlarged prostate   . Headache    when younger but not any longer  . Heart murmur    teen  . History of abdominal aortic aneurysm (AAA)   . History of colon polyps 2006,2008  . History of transfusion 2015  . Hx of radiation therapy 09/02/11 to 10/19/11   chest  . Hx of radiation therapy   . Hyperlipidemia    takes Pravastatin  . Hypothyroidism    takes Synthroid daily  . Inguinal hernia   . Lung cancer ([name])    right upper lobe  . Personal history of adenomatous colonic polyps 07/04/2012   2006, adenomatous right colon polyp removed, then ablated in 2007 followup, 2008, adenomatous polyp of the right colon removed with hot biopsy forceps. All by Dr. Lajoyce Corners 01/03/2013 diminutive polyp ascending colon - adenoma    . Rosacea   . Shortness of breath    with exertion  . Small bowel obstruction ([place]) 07/05/2014  . Status post chemotherapy    last chemo 2013  . Umbilical hernia, incarcerated- surgery 07/10/14 07/05/2014    ALLERGIES:  has No Known Allergies.  MEDICATIONS:  Current Outpatient Medications  Medication Sig Dispense Refill  . furosemide (LASIX) 20 MG tablet Take 1 tablet (20 mg total) by mouth daily. 90 tablet 3  . Garlic 106 MG TABS Take 2 tablets by mouth 2 (two) times daily.    . metoprolol tartrate (LOPRESSOR) 25 MG tablet TAKE 1 TABLET (25 MG TOTAL) BY MOUTH 2 (TWO) TIMES DAILY. 180 tablet 3  . Multiple Vitamin (MULTIVITAMIN WITH MINERALS) TABS tablet Take 1 tablet by mouth daily.    . Multiple Vitamins-Minerals (PRESERVISION AREDS 2+MULTI VIT PO) Take 1 capsule by mouth 2 (two) times daily.    . Probiotic Product (PROBIOTIC DAILY PO) Take 1 capsule by mouth daily. [place]  Specialty Vitamins Products (PROSTATE PO) Take 1 tablet by mouth 2 (two) times daily.    Marland Kitchen SYNTHROID 175 MCG tablet Take 175 mcg by mouth daily before breakfast.     . tiotropium (SPIRIVA) 18 MCG inhalation capsule Place 18 mcg into inhaler and inhale daily.     . vitamin B-12 (CYANOCOBALAMIN) 1000 MCG tablet Take 1,000 mcg by mouth daily.    Marland Kitchen albuterol (PROVENTIL HFA;VENTOLIN HFA) 108 (90 Base) MCG/ACT inhaler Inhale 1-2 puffs into the lungs every 6 (six) hours as needed for wheezing or shortness of breath.    . methocarbamol (ROBAXIN) 500 MG tablet Take 1 tablet (500 mg total) by mouth every 6 (six) hours  as needed for muscle spasms. (Patient not taking: Reported on 01/10/2018) 30 tablet 0  . metroNIDAZOLE (METROCREAM) 0.75 % cream Apply 1 application topically 2 (two) times daily as needed.    . nystatin cream (MYCOSTATIN) Apply 1 application topically 2 (two) times daily as needed for dry skin.    Marland Kitchen oxyCODONE (OXY IR/ROXICODONE) 5 MG immediate release tablet Take 1 tablet (5 mg total) by mouth every 8 (eight) hours as needed for moderate pain or severe pain ((score 4 to 6)). (Patient not taking: Reported on 01/03/2018) 30 tablet 0  . triamcinolone cream (KENALOG) 0.1 % Apply 1 application topically 3 times/day as needed-between meals & bedtime (skin irriation).     No current facility-administered medications for this visit.     SURGICAL HISTORY:  Past Surgical History:  Procedure Laterality Date  . ABDOMINAL AORTAGRAM  10/20/2013   Procedure: ABDOMINAL Maxcine Ham;  Surgeon: Elam Dutch, MD;  Location: [facility] CATH LAB;  Service: Cardiovascular;;  . ABDOMINAL AORTIC ANEURYSM REPAIR N/A 10/31/2013   Procedure: aorto to left external iliac and right external iliac and left internal iliac, reimplantation of inferior mesenteric artery and ligation of left iliac artery aneursym;  Surgeon: Elam Dutch, MD;  Location: [facility];  Service: Vascular;  Laterality: N/A;  . ABDOMINAL WOUND DEHISCENCE N/A 11/10/2013   Procedure: ABDOMINAL WOUND CLOSURE ;  Surgeon: Serafina Mitchell, MD;  Location: [facility];  Service: Vascular;  Laterality: N/A;  . bletharoplasty    . BOWEL RESECTION N/A 07/10/2014   Procedure: SMALL BOWEL RESECTION;  Surgeon: [name], MD;  Location: [facility];  Service: General;  Laterality: N/A;  . CARDIAC CATHETERIZATION  2003  . CATARACT EXTRACTION, BILATERAL    . COLONOSCOPY W/ BIOPSIES     multiple   . ESOPHAGOGASTRODUODENOSCOPY  2006  . FINGER SURGERY  2008   left pointer finger  . HERNIA REPAIR  70's   x3  . INCISIONAL HERNIA REPAIR N/A 07/10/2014   Procedure: HERNIA REPAIR  INCISIONAL;  Surgeon: [name], MD;  Location: [facility];  Service: General;  Laterality: N/A;  . KNEE ARTHROSCOPY  2008   left knee  . LAPAROTOMY N/A 07/10/2014   Procedure: EXPLORATORY LAPAROTOMY;  Surgeon: [name], MD;  Location: [facility];  Service: General;  Laterality: N/A;  . LOWER EXTREMITY ANGIOGRAM Bilateral 10/20/2013   Procedure: LOWER EXTREMITY ANGIOGRAM;  Surgeon: Elam Dutch, MD;  Location: [facility] CATH LAB;  Service: Cardiovascular;  Laterality: Bilateral;  . LYSIS OF ADHESION N/A 07/10/2014   Procedure: LYSIS OF ADHESION - 90 minutes;  Surgeon: [name], MD;  Location: [facility];  Service: General;  Laterality: N/A;  . PORTACATH PLACEMENT  08/10/2011   Procedure: INSERTION PORT-A-CATH;  Surgeon: Pierre Bali, MD;  Location: [facility];  Service:  Thoracic;  Laterality: Left;  Left Subclavian  . PTOSIS REPAIR Bilateral 12-02-2015  . THROMBECTOMY ILIAC ARTERY Bilateral 10/31/2013   Procedure: THROMBECTOMY ILIAC ARTERY;  Surgeon: Elam Dutch, MD;  Location: [facility];  Service: Vascular;  Laterality: Bilateral;  . TONSILLECTOMY     as a child  . TOTAL KNEE ARTHROPLASTY Left 08/03/2017   Procedure: LEFT TOTAL KNEE ARTHROPLASTY;  Surgeon: [name], MD;  Location: [facility];  Service: Orthopedics;  Laterality: Left;  Marland Kitchen VASECTOMY  70's  . VENTRAL HERNIA REPAIR N/A 04/04/2015   Procedure: OPEN REPAIR OF RECURRENT ABDOMINAL WALL HERNIA WITH MESH, COMPLEX LYSIS OF ADHESIONS X1 HR TAR COMPONENT SEPARATION;  Surgeon: Greer Pickerel, MD;  Location: [facility];  Service: General;  Laterality: N/A;    REVIEW OF SYSTEMS:  A comprehensive review of systems was negative.   PHYSICAL EXAMINATION: General appearance: alert, cooperative and no distress Head: Normocephalic, without obvious abnormality, atraumatic Neck: no adenopathy Lymph nodes: Cervical, supraclavicular, and axillary nodes normal. Resp: clear to auscultation bilaterally Back: symmetric, no curvature. ROM normal. No CVA  tenderness. Cardio: regular rate and rhythm, S1, S2 normal, no murmur, click, rub or gallop GI: soft, non-tender; bowel sounds normal; no masses,  no organomegaly Extremities: extremities normal, atraumatic, no cyanosis or edema  ECOG PERFORMANCE STATUS: 1 - Symptomatic but completely ambulatory  Blood pressure (!) 141/86, pulse 84, temperature 98.3 F (36.8 C), temperature source Oral, resp. rate 16, height 6' (1.829 m), weight 212 lb 12.8 oz (96.5 kg), SpO2 95 %.  LABORATORY DATA: Lab Results  Component Value Date   WBC 4.5 01/07/2018   HGB 14.8 01/07/2018   HCT 43.9 01/07/2018   MCV 97.9 01/07/2018   PLT 113 (L) 01/07/2018      Chemistry      Component Value Date/Time   NA 139 01/07/2018 1016   NA 140 01/06/2017 1105   K 4.4 01/07/2018 1016   K 4.3 01/06/2017 1105   CL 110 (H) 01/07/2018 1016   CL 109 (H) 08/01/2012 1046   CO2 23 01/07/2018 1016   CO2 24 01/06/2017 1105   BUN 13 01/07/2018 1016   BUN 14.4 01/06/2017 1105   CREATININE 0.90 01/07/2018 1016   CREATININE 0.8 01/06/2017 1105      Component Value Date/Time   CALCIUM 8.7 01/07/2018 1016   CALCIUM 8.8 01/06/2017 1105   ALKPHOS 152 (H) 01/07/2018 1016   ALKPHOS 147 01/06/2017 1105   AST 32 01/07/2018 1016   AST 39 (H) 01/06/2017 1105   ALT 26 01/07/2018 1016   ALT 32 01/06/2017 1105   BILITOT 1.1 01/07/2018 1016   BILITOT 1.57 (H) 01/06/2017 1105       RADIOGRAPHIC STUDIES: Ct Chest W Contrast  Result Date: 01/08/2018 CLINICAL DATA:  Followup lung cancer. EXAM: CT CHEST WITH CONTRAST TECHNIQUE: Multidetector CT imaging of the chest was performed during intravenous contrast administration. CONTRAST:  20mL OMNIPAQUE IOHEXOL 300 MG/ML  SOLN COMPARISON:  01/06/2017 FINDINGS: Cardiovascular: Normal heart size. No pericardial effusion. Aortic atherosclerosis. Calcifications in the RCA, LAD coronary arteries noted. Mediastinum/Nodes: Thyroid gland is not visualized. The trachea appears patent and is midline.  Normal appearance of the esophagus. Posterior mediastinal lymph node adjacent to the esophagus and descending aorta measures 1.1 cm, image 137/2. Previously 1 cm. No additional enlarged mediastinal or hilar lymph nodes. Lungs/Pleura: No pleural effusion identified. Advanced changes of emphysema. Paramediastinal radiation change within the right lung is again noted. Perifissural right upper lobe nodule measures 0.7 cm, image  78/5. Previously 0.9 cm Subpleural thickening along the left heart border within the lingula measures 6 mm in thickness, image 47/6. Previously 3 mm. Upper Abdomen: The liver appears cirrhotic with a nodular contour. No focal liver abnormalities noted. 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 Musculoskeletal: Spondylosis in the thoracic spine. No suspicious bone lesions. IMPRESSION: 1. Stable exam. No specific findings identified to suggest recurrent tumor or metastatic disease. 2. Mild subpleural thickening within the lingula has increased from previous exam. Nonspecific but worthy of attention on follow-up imaging. 3. Unchanged borderline enlarged posterior mediastinal node measuring 1.1 cm. 4. Aortic Atherosclerosis (ICD10-I70.0) and Emphysema (ICD10-J43.9). RCA and LAD coronary artery atherosclerotic calcifications noted. 5. 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 Electronically Signed   By: Kerby Moors M.D.   On: 01/08/2018 19:07   ASSESSMENT AND PLAN:  This is a very pleasant 77 years old white male with stage IIIB non-small cell lung cancer status post concurrent chemoradiation followed by consolidation chemotherapy and has been observation since May 2013. The patient has no concerning complaints today.  He had repeat CT scan of the chest performed recently.  His a scan showed no concerning findings for disease recurrence.  I discussed the scan results with the patient and his wife and  recommended for him to continue on observation with repeat CT scan of the chest in 1 year.   He was advised to call immediately if he has any concerning symptoms in the interval. All questions were answered. The patient knows to call the clinic with any problems, questions or concerns. We can certainly see the patient much sooner if necessary. I spent 10 minutes counseling the patient face to face. The total time spent in the appointment was 15 minutes.  Disclaimer: This note was dictated with voice recognition software. Similar sounding words can inadvertently be transcribed and may not be corrected upon review.

## 2018-01-12 ENCOUNTER — Institutional Professional Consult (permissible substitution): Payer: PPO | Admitting: Cardiothoracic Surgery

## 2018-01-12 ENCOUNTER — Other Ambulatory Visit: Payer: Self-pay | Admitting: *Deleted

## 2018-01-12 VITALS — BP 115/70 | HR 76 | Resp 20 | Ht 72.0 in | Wt 212.0 lb

## 2018-01-12 DIAGNOSIS — C3490 Malignant neoplasm of unspecified part of unspecified bronchus or lung: Secondary | ICD-10-CM

## 2018-01-12 DIAGNOSIS — Z95828 Presence of other vascular implants and grafts: Secondary | ICD-10-CM | POA: Diagnosis not present

## 2018-01-12 NOTE — Progress Notes (Signed)
[address]       [place] [address]             [phone]                    Kerney J Fiebig [facility] Medical Record #[id number] Date of Birth: 1940-09-24  Referring: Curt Bears, MD Primary Care: [name], MD Primary Cardiologist: No primary care provider on file.  Chief Complaint:    Chief Complaint  Patient presents with  . Lung Cancer    discuss removing Port-A- Cath    History of Present Illness:    Joseph [name] 78 y.o. male is seen in the office  today for consideration of removal of a Port-A-Cath.  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  Dr. Arlyce Dice placed a left subclavian vein Port-A-Cath at that time that was used for his treatment with combined radiation and chemotherapy.  The patient has been followed in the oncology clinic since that time without evidence of recurrence.  His Port-A-Cath is occasionally used when he gets a CT scan, but he has adequate vein for peripheral sticks.  He brought up that he was tired of having to go get it flushed every 6 weeks.  Patient was referred by Dr. Julien Nordmann for removal of the Port-A-Cath.   Patient does have underlying emphysematous changes in the lung, he has had environmental exposures both of tobacco and also powdered metals used as electronic coatings.  He quit smoking at the time of his diagnosis of lung cancer and has remained smoke-free since that time.  Patient has had a history of atrial fibrillation in the past, he is not currently on any anticoagulation   Cancer Staging Cancer of right lung ([place]) Staging form: Lung, AJCC 7th Edition - Clinical: Stage IIIA (T2a, N2, M0) - Signed by Curt Bears, MD on 08/06/2011 - Pathologic: No stage assigned - Unsigned   Current Activity/ Functional Status:  Patient is independent with mobility/ambulation, transfers, ADL's, IADL's.   Zubrod Score: At the time of surgery this patient's  most appropriate activity status/level should be described as: _0     0    Normal activity, no symptoms _1     1    Restricted in physical strenuous activity but ambulatory, able to do out light work _2     2    Ambulatory and capable of self care, unable to do work activities, up and about               >50 % of waking hours                              _3     3    Only limited self care, in bed greater than 50% of waking hours _4     4    Completely disabled, no self care, confined to bed or chair _5     5    Moribund   Past Medical History:  Diagnosis Date  . Acute on chronic diastolic CHF (congestive heart failure), NYHA class 3 ([place]) 07/23/2014  . Arthritis    left knee and back arthrtis  . Atrial fibrillation with rapid ventricular response ([place]) 07/04/2014  . CAD (coronary artery disease)   . Cataract   . Cirrhosis ([place])    By radiography, seen on CT  . Diverticulosis   . Emphysema   .  Enlarged prostate   . Headache    when younger but not any longer  . Heart murmur    teen  . History of abdominal aortic aneurysm (AAA)   . History of colon polyps 2006,2008  . History of transfusion 2015  . Hx of radiation therapy 09/02/11 to 10/19/11   chest  . Hx of radiation therapy   . Hyperlipidemia    takes Pravastatin  . Hypothyroidism    takes Synthroid daily  . Inguinal hernia   . Lung cancer ([place])    right upper lobe  . Personal history of adenomatous colonic polyps 07/04/2012   2006, adenomatous right colon polyp removed, then ablated in 2007 followup, 2008, adenomatous polyp of the right colon removed with hot biopsy forceps. All by Dr. Lajoyce Corners 01/03/2013 diminutive polyp ascending colon - adenoma    . Rosacea   . Shortness of breath    with exertion  . Small bowel obstruction ([place]) 07/05/2014  . Status post chemotherapy    last chemo 2013  . Umbilical hernia, incarcerated- surgery 07/10/14 07/05/2014    Past Surgical History:  Procedure Laterality Date  . ABDOMINAL AORTAGRAM   10/20/2013   Procedure: ABDOMINAL Maxcine Ham;  Surgeon: Elam Dutch, MD;  Location: [facility] CATH LAB;  Service: Cardiovascular;;  . ABDOMINAL AORTIC ANEURYSM REPAIR N/A 10/31/2013   Procedure: aorto to left external iliac and right external iliac and left internal iliac, reimplantation of inferior mesenteric artery and ligation of left iliac artery aneursym;  Surgeon: Elam Dutch, MD;  Location: [facility];  Service: Vascular;  Laterality: N/A;  . ABDOMINAL WOUND DEHISCENCE N/A 11/10/2013   Procedure: ABDOMINAL WOUND CLOSURE ;  Surgeon: Serafina Mitchell, MD;  Location: [facility];  Service: Vascular;  Laterality: N/A;  . bletharoplasty    . BOWEL RESECTION N/A 07/10/2014   Procedure: SMALL BOWEL RESECTION;  Surgeon: [name], MD;  Location: [facility];  Service: General;  Laterality: N/A;  . CARDIAC CATHETERIZATION  2003  . CATARACT EXTRACTION, BILATERAL    . COLONOSCOPY W/ BIOPSIES     multiple   . ESOPHAGOGASTRODUODENOSCOPY  2006  . FINGER SURGERY  2008   left pointer finger  . HERNIA REPAIR  70's   x3  . INCISIONAL HERNIA REPAIR N/A 07/10/2014   Procedure: HERNIA REPAIR INCISIONAL;  Surgeon: [name], MD;  Location: [facility];  Service: General;  Laterality: N/A;  . KNEE ARTHROSCOPY  2008   left knee  . LAPAROTOMY N/A 07/10/2014   Procedure: EXPLORATORY LAPAROTOMY;  Surgeon: [name], MD;  Location: [facility];  Service: General;  Laterality: N/A;  . LOWER EXTREMITY ANGIOGRAM Bilateral 10/20/2013   Procedure: LOWER EXTREMITY ANGIOGRAM;  Surgeon: Elam Dutch, MD;  Location: [facility] CATH LAB;  Service: Cardiovascular;  Laterality: Bilateral;  . LYSIS OF ADHESION N/A 07/10/2014   Procedure: LYSIS OF ADHESION - 90 minutes;  Surgeon: [name], MD;  Location: [facility];  Service: General;  Laterality: N/A;  . PORTACATH PLACEMENT  08/10/2011   Procedure: INSERTION PORT-A-CATH;  Surgeon: Pierre Bali, MD;  Location: [facility];  Service: Thoracic;  Laterality: Left;  Left Subclavian  . PTOSIS REPAIR  Bilateral 12-02-2015  . THROMBECTOMY ILIAC ARTERY Bilateral 10/31/2013   Procedure: THROMBECTOMY ILIAC ARTERY;  Surgeon: Elam Dutch, MD;  Location: [facility];  Service: Vascular;  Laterality: Bilateral;  . TONSILLECTOMY     as a child  . TOTAL KNEE ARTHROPLASTY Left 08/03/2017   Procedure: LEFT TOTAL KNEE  ARTHROPLASTY;  Surgeon: [name], MD;  Location: [facility];  Service: Orthopedics;  Laterality: Left;  Marland Kitchen VASECTOMY  70's  . VENTRAL HERNIA REPAIR N/A 04/04/2015   Procedure: OPEN REPAIR OF RECURRENT ABDOMINAL WALL HERNIA WITH MESH, COMPLEX LYSIS OF ADHESIONS X1 HR TAR COMPONENT SEPARATION;  Surgeon: Greer Pickerel, MD;  Location: [facility];  Service: General;  Laterality: N/A;    Family History  Problem Relation Age of Onset  . Cancer Father   . Diabetes Father   . Hyperlipidemia Mother   . Hypertension Mother   . Hyperlipidemia Brother   . AAA (abdominal aortic aneurysm) Brother   . Hypertension Brother   . Hyperlipidemia Sister   . Hypertension Sister   . Anesthesia problems Neg Hx   . Hypotension Neg Hx   . Malignant hyperthermia Neg Hx   . Pseudochol deficiency Neg Hx   . Colon cancer Neg Hx      Social History   Tobacco Use  Smoking Status Former Smoker  . Packs/day: 1.50  . Years: 40.00  . Pack years: 60.00  . Types: Cigarettes  . Last attempt to quit: 07/02/2011  . Years since quitting: 6.5  Smokeless Tobacco Former Systems developer    Social History   Substance and Sexual Activity  Alcohol Use Yes   Comment: social - 2 MONTHLY     No Known Allergies  Current Outpatient Medications  Medication Sig Dispense Refill  . albuterol (PROVENTIL HFA;VENTOLIN HFA) 108 (90 Base) MCG/ACT inhaler Inhale 1-2 puffs into the lungs every 6 (six) hours as needed for wheezing or shortness of breath.    . furosemide (LASIX) 20 MG tablet Take 1 tablet (20 mg total) by mouth daily. 90 tablet 3  . Garlic 035 MG TABS Take 2 tablets by mouth 2 (two) times daily.    . methocarbamol (ROBAXIN)  500 MG tablet Take 1 tablet (500 mg total) by mouth every 6 (six) hours as needed for muscle spasms. 30 tablet 0  . metoprolol tartrate (LOPRESSOR) 25 MG tablet TAKE 1 TABLET (25 MG TOTAL) BY MOUTH 2 (TWO) TIMES DAILY. 180 tablet 3  . metroNIDAZOLE (METROCREAM) 0.75 % cream Apply 1 application topically 2 (two) times daily as needed.    . Multiple Vitamin (MULTIVITAMIN WITH MINERALS) TABS tablet Take 1 tablet by mouth daily.    . Multiple Vitamins-Minerals (PRESERVISION AREDS 2+MULTI VIT PO) Take 1 capsule by mouth 2 (two) times daily.    Marland Kitchen nystatin cream (MYCOSTATIN) Apply 1 application topically 2 (two) times daily as needed for dry skin.    Marland Kitchen oxyCODONE (OXY IR/ROXICODONE) 5 MG immediate release tablet Take 1 tablet (5 mg total) by mouth every 8 (eight) hours as needed for moderate pain or severe pain ((score 4 to 6)). 30 tablet 0  . Probiotic Product (PROBIOTIC DAILY PO) Take 1 capsule by mouth daily. [employer] Vitamins Products (PROSTATE PO) Take 1 tablet by mouth 2 (two) times daily.    Marland Kitchen SYNTHROID 175 MCG tablet Take 175 mcg by mouth daily before breakfast.     . tiotropium (SPIRIVA) 18 MCG inhalation capsule Place 18 mcg into inhaler and inhale daily.     Marland Kitchen triamcinolone cream (KENALOG) 0.1 % Apply 1 application topically 3 times/day as needed-between meals & bedtime (skin irriation).    . vitamin B-12 (CYANOCOBALAMIN) 1000 MCG tablet Take 1,000 mcg by mouth daily.     No current facility-administered medications for this visit.     Pertinent  items are noted in HPI.   Review of Systems:     Cardiac Review of Systems: [Y] = yes  or   [ N ] = no   Chest Pain [  n  ]  Resting SOB Florencio.Farrier   ] Exertional SOB  Blue.Reese  ]  Orthopnea Florencio.Farrier  ]   Pedal Edema [ n  ]    Palpitations [ n ] Syncope  [ n ]   Presyncope [  n ]   General Review of Systems: [Y] = yes [  ]=no Constitional: recent weight change [  ];  Wt loss over the last 3 months [   ] anorexia [  ]; fatigue [  ]; nausea [  ];  night sweats [  ]; fever [  ]; or chills [  ];           Eye : blurred vision [  ]; diplopia [   ]; vision changes [  ];  Amaurosis fugax[  ]; Resp: cough [  ];  wheezing[  ];  hemoptysis[  ]; shortness of breath[  ]; paroxysmal nocturnal dyspnea[  ]; dyspnea on exertion[  ]; or orthopnea[  ];  GI:  gallstones[  ], vomiting[  ];  dysphagia[  ]; melena[  ];  hematochezia [  ]; heartburn[  ];   Hx of  Colonoscopy[  ]; GU: kidney stones [  ]; hematuria[  ];   dysuria [  ];  nocturia[  ];  history of     obstruction [  ]; urinary frequency [  ]             Skin: rash, swelling[  ];, hair loss[  ];  peripheral edema[  ];  or itching[  ]; Musculosketetal: myalgias[  ];  joint swelling[  ];  joint erythema[  ];  joint pain[  ];  back pain[  ];  Heme/Lymph: bruising[  ];  bleeding[  ];  anemia[  ];  Neuro: TIA[  ];  headaches[  ];  stroke[  ];  vertigo[  ];  seizures[  ];   paresthesias[  ];  difficulty walking[  ];  Psych:depression[  ]; anxiety[  ];  Endocrine: diabetes[  ];  thyroid dysfunction[  ];  Immunizations: Flu up to date [  ]; Pneumococcal up to date [  ];  Other:    PHYSICAL EXAMINATION: BP 115/70   Pulse 76   Resp 20   Ht 6' (1.829 m)   Wt 212 lb (96.2 kg)   SpO2 93% Comment: RA  BMI 28.75 kg/m  General appearance: alert and cooperative Head: Normocephalic, without obvious abnormality, atraumatic Neck: no adenopathy, no carotid bruit, no JVD, supple, symmetrical, trachea midline and thyroid not enlarged, symmetric, no tenderness/mass/nodules Lymph nodes: Cervical, supraclavicular, and axillary nodes normal. Resp: clear to auscultation bilaterally Back: symmetric, no curvature. ROM normal. No CVA tenderness. Cardio: regular rate and rhythm, S1, S2 normal, no murmur, click, rub or gallop GI: soft, non-tender; bowel sounds normal; no masses,  no organomegaly and Previous abdominal incisional hernia repair intact Extremities: extremities normal, atraumatic, no cyanosis or edema and  Homans sign is negative, no sign of DVT Neurologic: Grossly normal  Diagnostic Studies & Laboratory data:     Recent Radiology Findings:   Ct Chest W Contrast  Result Date: 01/08/2018 CLINICAL DATA:  Followup lung cancer. EXAM: CT CHEST WITH CONTRAST TECHNIQUE: Multidetector CT imaging of the chest was performed during intravenous contrast administration. CONTRAST:  2m OMNIPAQUE IOHEXOL 300 MG/ML  SOLN COMPARISON:  01/06/2017 FINDINGS: Cardiovascular: Normal heart size. No pericardial effusion. Aortic atherosclerosis. Calcifications in the RCA, LAD coronary arteries noted. Mediastinum/Nodes: Thyroid gland is not visualized. The trachea appears patent and is midline. Normal appearance of the esophagus. Posterior mediastinal lymph node adjacent to the esophagus and descending aorta measures 1.1 cm, image 137/2. Previously 1 cm. No additional enlarged mediastinal or hilar lymph nodes. Lungs/Pleura: No pleural effusion identified. Advanced changes of emphysema. Paramediastinal radiation change within the right lung is again noted. Perifissural right upper lobe nodule measures 0.7 cm, image 78/5. Previously 0.9 cm Subpleural thickening along the left heart border within the lingula measures 6 mm in thickness, image 47/6. Previously 3 mm. Upper Abdomen: The liver appears cirrhotic with a nodular contour. No focal liver abnormalities noted. 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 Musculoskeletal: Spondylosis in the thoracic spine. No suspicious bone lesions. IMPRESSION: 1. Stable exam. No specific findings identified to suggest recurrent tumor or metastatic disease. 2. Mild subpleural thickening within the lingula has increased from previous exam. Nonspecific but worthy of attention on follow-up imaging. 3. Unchanged borderline enlarged posterior mediastinal node measuring 1.1 cm. 4. Aortic Atherosclerosis (ICD10-I70.0) and Emphysema (ICD10-J43.9). RCA and LAD  coronary artery atherosclerotic calcifications noted. 5. 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 Electronically Signed   By: TKerby MoorsM.D.   On: 01/08/2018 19:07     I have independently reviewed the above radiology studies  and reviewed the findings with the patient.   Recent Lab Findings: Lab Results  Component Value Date   WBC 4.5 01/07/2018   HGB 14.8 01/07/2018   HCT 43.9 01/07/2018   PLT 113 (L) 01/07/2018   GLUCOSE 85 01/07/2018   CHOL 188 10/24/2014   TRIG 195 (H) 10/24/2014   HDL 35 (L) 10/24/2014   LDLCALC 114 (H) 10/24/2014   ALT 26 01/07/2018   AST 32 01/07/2018   NA 139 01/07/2018   K 4.4 01/07/2018   CL 110 (H) 01/07/2018   CREATININE 0.90 01/07/2018   BUN 13 01/07/2018   CO2 23 01/07/2018   TSH 0.587 10/21/2016   INR 1.08 07/21/2017      Assessment / Plan:   Patient with history of stage IIIa non-small cell lung cancer treated with combined radiation chemotherapy in 2012, now without recurrence on CT scan.  Patient referred for removal of Port-A-Cath I discussed with the patient the procedure to remove the Port-A-Cath under MAC anesthesia, we also discussed both the risk of leaving it in place and some risk of removal.  He is willing to proceed tentatively planned for May 22     I  spent 25 minutes with  the patient face to face and greater then 50% of the time was spent in counseling and coordination of care.    EGrace IsaacMD      [address] [place] [id number] [phone]   Beeper [phone] 01/12/2018 1:58 PM

## 2018-01-14 NOTE — Pre-Procedure Instructions (Signed)
Joseph [name]  01/14/2018      [id number] - [place], [place] [address] [place] [address] Phone: [phone] Fax: [phone]    Your procedure is scheduled on Wed., Jan 19, 2018 from 7:30AM- 8:12AM  Report to [facility] Admitting Entrance "A" at 5:30AM  Call this number if you have problems the morning of surgery:  [phone]   Remember:  No food after midnight on May 21st  You may drink Clear Liquids until 3 hours (4:30AM) prior to surgery.  Clear liquids allowed are: Water, Juice (non-citric and without pulp), Carbonated beverages, Clear Tea, Black Coffee only, Plain Jell-O only, Gatorade and Plain Popsicles only    Take these medicines the morning of surgery with A SIP OF WATER:  Metoprolol tartrate (LOPRESSOR), SYNTHROID, and Tiotropium (SPIRIVA). If needed Methocarbamol (ROBAXIN) for spasms and Albuterol Inhaler for cough or wheezing (Bring with you the day of surgery).  Follow your doctors instructions regarding your Aspirin.  If no instructions were given by your doctor, then you will need to call the prescribing office office to get instructions.    As of today, stop taking all Other Aspirin Products, Vitamins, Fish oils, and Herbal medications. Also stop all NSAIDS i.e. Advil, Ibuprofen, Motrin, Aleve, Anaprox, Naproxen, BC and Goody Powders.    Do not wear jewelry.  Do not wear lotions, powders, colognes, or deodorant.  Do not shave 48 hours prior to surgery.  Men may shave face.  Do not bring valuables to the hospital.  [facility] is not responsible for any belongings or valuables.  Contacts, dentures or bridgework may not be worn into surgery.  Leave your suitcase in the car.  After surgery it may be brought to your room.  For patients admitted to the hospital, discharge time will be determined by your treatment team.  Patients discharged the day of surgery will  not be allowed to drive home.   Special instructions:  [facility]- Preparing For Surgery  Before surgery, you can play an important role. Because skin is not sterile, your skin needs to be as free of germs as possible. You can reduce the number of germs on your skin by washing with CHG (chlorahexidine gluconate) Soap before surgery.  CHG is an antiseptic cleaner which kills germs and bonds with the skin to continue killing germs even after washing.  Oral Hygiene is also important to reduce your risk of infection.  Remember - BRUSH YOUR TEETH THE MORNING OF SURGERY  Please do not use if you have an allergy to CHG or antibacterial soaps. If your skin becomes reddened/irritated stop using the CHG.  Do not shave (including legs and underarms) for at least 48 hours prior to first CHG shower. It is OK to shave your face.  Please follow these instructions carefully.   1. Shower the NIGHT BEFORE SURGERY and the MORNING OF SURGERY with CHG.   2. If you chose to wash your hair, wash your hair first as usual with your normal shampoo.  3. After you shampoo, rinse your hair and body thoroughly to remove the shampoo.  4. Use CHG as you would any other liquid soap. You can apply CHG directly to the skin and wash gently with a scrungie or a clean washcloth.   5. Apply the CHG Soap to your body ONLY FROM THE NECK DOWN.  Do not use on open wounds or open sores. Avoid contact  with your eyes, ears, mouth and genitals (private parts). Wash Face and genitals (private parts)  with your normal soap.  6. Wash thoroughly, paying special attention to the area where your surgery will be performed.  7. Thoroughly rinse your body with warm water from the neck down.  8. DO NOT shower/wash with your normal soap after using and rinsing off the CHG Soap.  9. Pat yourself dry with a CLEAN TOWEL.  10. Wear CLEAN PAJAMAS to bed the night before surgery, wear comfortable clothes the morning of surgery  11. Place CLEAN  SHEETS on your bed the night of your first shower and DO NOT SLEEP WITH PETS.  Day of Surgery:  Do not apply any deodorants/lotions.  Please wear clean clothes to the hospital/surgery center.   Remember to brush your teeth.    Please read over the following fact sheets that you were given. Pain Booklet, Coughing and Deep Breathing and Surgical Site Infection Prevention

## 2018-01-17 ENCOUNTER — Inpatient Hospital Stay (HOSPITAL_COMMUNITY)
Admission: RE | Admit: 2018-01-17 | Discharge: 2018-01-17 | Disposition: A | Discharge status: Home / Self Care | Payer: PPO | Source: Ambulatory Visit

## 2018-01-17 NOTE — Progress Notes (Addendum)
PCP:  Cardiologist:    EKG: 05/21/17 in [facility]  Stress test: 02/07/15 in EP[facility]  ECHO: 10/12/13 in EP[facility]  Cardiac Cath:  Chest x-ray: 07/22/17 in [facility]

## 2018-01-17 NOTE — Progress Notes (Addendum)
Joseph [name]            01/14/2018                          [id number] - [place], [place] [address] [place] [address] Phone: [phone] Fax: [phone]              Your procedure is scheduled on Wed., Jan 19, 2018             Report to [facility] Admitting Entrance "A" at 5:30AM            Call this number if you have problems the morning of surgery:            [phone]             Remember:            No food or drink after midnight on May 21st  Continue all medications as directed by your physician except follow these medication instructions before surgery below                        Take these medicines the morning of surgery with A SIP OF WATER:  Metoprolol tartrate (LOPRESSOR), SYNTHROID, and Tiotropium ([place]). If needed Methocarbamol (ROBAXIN) for spasms and Albuterol Inhaler for cough or wheezing (Bring with you the day of surgery).  Follow your doctors instructions regarding your Aspirin.  If no instructions were given by your doctor, then you will need to call the prescribing office office to get instructions.    As of today, stop taking all Other Aspirin Products, Vitamins, Fish oils, and Herbal medications. Also stop all NSAIDS i.e. Advil, Ibuprofen, Motrin, Aleve, Anaprox, Naproxen, BC and Goody Powders.                        Do not wear jewelry.            Do not wear lotions, powders, colognes, or deodorant.            Do not shave 48 hours prior to surgery.  Men may shave face.            Do not bring valuables to the hospital.            [employer] is not responsible for any belongings or valuables.  Contacts, dentures or bridgework may not be worn into surgery.  Leave your suitcase in the car.  After surgery it may be brought to your room.  For patients admitted to the hospital, discharge time will be determined by your treatment  team.  Patients discharged the day of surgery will not be allowed to drive home.   Special instructions:  [facility]- Preparing For Surgery  Before surgery, you can play an important role. Because skin is not sterile, your skin needs to be as free of germs as possible. You can reduce the number of germs on your skin by washing with CHG (chlorahexidine gluconate) Soap before surgery.  CHG is an antiseptic cleaner which kills germs and bonds with the skin to continue killing germs even after washing.  Oral Hygiene is also important to reduce your risk of infection.  Remember - BRUSH YOUR TEETH THE MORNING OF SURGERY  Please do  not use if you have an allergy to CHG or antibacterial soaps. If your skin becomes reddened/irritated stop using the CHG.  Do not shave (including legs and underarms) for at least 48 hours prior to first CHG shower. It is OK to shave your face.  Please follow these instructions carefully.                                                                                                                     1. Shower the NIGHT BEFORE SURGERY and the MORNING OF SURGERY with CHG.   2. If you chose to wash your hair, wash your hair first as usual with your normal shampoo.  3. After you shampoo, rinse your hair and body thoroughly to remove the shampoo.  4. Use CHG as you would any other liquid soap. You can apply CHG directly to the skin and wash gently with a scrungie or a clean washcloth.   5. Apply the CHG Soap to your body ONLY FROM THE NECK DOWN.  Do not use on open wounds or open sores. Avoid contact with your eyes, ears, mouth and genitals (private parts). Wash Face and genitals (private parts)  with your normal soap.  6. Wash thoroughly, paying special attention to the area where your surgery will be performed.  7. Thoroughly rinse your body with warm water from the neck down.  8. DO NOT shower/wash with your normal soap after using and rinsing off the  CHG Soap.  9. Pat yourself dry with a CLEAN TOWEL.  10. Wear CLEAN PAJAMAS to bed the night before surgery, wear comfortable clothes the morning of surgery  11. Place CLEAN SHEETS on your bed the night of your first shower and DO NOT SLEEP WITH PETS.  Day of Surgery:  Do not apply any deodorants/lotions.  Please wear clean clothes to the hospital/surgery center.   Remember to brush your teeth.    Please read over the following fact sheets that you were given. Pain Booklet, Coughing and Deep Breathing and Surgical Site Infection Prevention

## 2018-01-18 ENCOUNTER — Other Ambulatory Visit: Payer: Self-pay

## 2018-01-18 ENCOUNTER — Encounter (HOSPITAL_COMMUNITY): Payer: Self-pay | Admitting: *Deleted

## 2018-01-19 ENCOUNTER — Encounter (HOSPITAL_COMMUNITY): Payer: Self-pay | Admitting: Anesthesiology

## 2018-01-19 ENCOUNTER — Encounter (HOSPITAL_COMMUNITY)
Admission: RE | Disposition: A | Discharge status: Home / Self Care | Payer: Self-pay | Source: Ambulatory Visit | Attending: Cardiothoracic Surgery

## 2018-01-19 ENCOUNTER — Ambulatory Visit (HOSPITAL_COMMUNITY)
Admission: RE | Admit: 2018-01-19 | Discharge: 2018-01-19 | Disposition: A | Discharge status: Home / Self Care | Payer: PPO | Source: Ambulatory Visit | Attending: Cardiothoracic Surgery | Admitting: Cardiothoracic Surgery

## 2018-01-19 ENCOUNTER — Ambulatory Visit (HOSPITAL_COMMUNITY): Payer: PPO | Admitting: Anesthesiology

## 2018-01-19 DIAGNOSIS — Z85118 Personal history of other malignant neoplasm of bronchus and lung: Secondary | ICD-10-CM | POA: Insufficient documentation

## 2018-01-19 DIAGNOSIS — J439 Emphysema, unspecified: Secondary | ICD-10-CM | POA: Insufficient documentation

## 2018-01-19 DIAGNOSIS — Z452 Encounter for adjustment and management of vascular access device: Secondary | ICD-10-CM | POA: Diagnosis not present

## 2018-01-19 DIAGNOSIS — I739 Peripheral vascular disease, unspecified: Secondary | ICD-10-CM | POA: Diagnosis not present

## 2018-01-19 DIAGNOSIS — E785 Hyperlipidemia, unspecified: Secondary | ICD-10-CM | POA: Insufficient documentation

## 2018-01-19 DIAGNOSIS — Z79899 Other long term (current) drug therapy: Secondary | ICD-10-CM | POA: Insufficient documentation

## 2018-01-19 DIAGNOSIS — C3490 Malignant neoplasm of unspecified part of unspecified bronchus or lung: Secondary | ICD-10-CM | POA: Diagnosis not present

## 2018-01-19 DIAGNOSIS — Z87891 Personal history of nicotine dependence: Secondary | ICD-10-CM | POA: Diagnosis not present

## 2018-01-19 DIAGNOSIS — Z9221 Personal history of antineoplastic chemotherapy: Secondary | ICD-10-CM | POA: Insufficient documentation

## 2018-01-19 DIAGNOSIS — E039 Hypothyroidism, unspecified: Secondary | ICD-10-CM | POA: Insufficient documentation

## 2018-01-19 DIAGNOSIS — I509 Heart failure, unspecified: Secondary | ICD-10-CM | POA: Insufficient documentation

## 2018-01-19 DIAGNOSIS — N400 Enlarged prostate without lower urinary tract symptoms: Secondary | ICD-10-CM | POA: Insufficient documentation

## 2018-01-19 DIAGNOSIS — I700 Atherosclerosis of aorta: Secondary | ICD-10-CM | POA: Insufficient documentation

## 2018-01-19 DIAGNOSIS — Z8679 Personal history of other diseases of the circulatory system: Secondary | ICD-10-CM | POA: Insufficient documentation

## 2018-01-19 DIAGNOSIS — I4891 Unspecified atrial fibrillation: Secondary | ICD-10-CM | POA: Insufficient documentation

## 2018-01-19 DIAGNOSIS — I110 Hypertensive heart disease with heart failure: Secondary | ICD-10-CM | POA: Insufficient documentation

## 2018-01-19 DIAGNOSIS — I2510 Atherosclerotic heart disease of native coronary artery without angina pectoris: Secondary | ICD-10-CM | POA: Diagnosis not present

## 2018-01-19 DIAGNOSIS — Z86010 Personal history of colonic polyps: Secondary | ICD-10-CM | POA: Diagnosis not present

## 2018-01-19 DIAGNOSIS — K7460 Unspecified cirrhosis of liver: Secondary | ICD-10-CM | POA: Insufficient documentation

## 2018-01-19 DIAGNOSIS — Z96652 Presence of left artificial knee joint: Secondary | ICD-10-CM | POA: Insufficient documentation

## 2018-01-19 DIAGNOSIS — Z7982 Long term (current) use of aspirin: Secondary | ICD-10-CM | POA: Diagnosis not present

## 2018-01-19 DIAGNOSIS — Z923 Personal history of irradiation: Secondary | ICD-10-CM | POA: Insufficient documentation

## 2018-01-19 HISTORY — DX: Unspecified injury of head, initial encounter: S09.90XA

## 2018-01-19 HISTORY — PX: PORT-A-CATH REMOVAL: SHX5289

## 2018-01-19 LAB — COMPREHENSIVE METABOLIC PANEL
ALT: 21 U/L (ref 17–63)
AST: 33 U/L (ref 15–41)
Albumin: 3.1 g/dL — ABNORMAL LOW (ref 3.5–5.0)
Alkaline Phosphatase: 126 U/L (ref 38–126)
Anion gap: 7 (ref 5–15)
BUN: 12 mg/dL (ref 6–20)
CO2: 24 mmol/L (ref 22–32)
Calcium: 8.7 mg/dL — ABNORMAL LOW (ref 8.9–10.3)
Chloride: 111 mmol/L (ref 101–111)
Creatinine, Ser: 0.93 mg/dL (ref 0.61–1.24)
GFR calc Af Amer: >60 mL/min (ref >60)
GFR calc non Af Amer: >60 mL/min (ref >60)
Glucose, Bld: 99 mg/dL (ref 65–99)
Potassium: 3.8 mmol/L (ref 3.5–5.1)
Sodium: 142 mmol/L (ref 135–145)
Total Bilirubin: 1.5 mg/dL — ABNORMAL HIGH (ref 0.3–1.2)
Total Protein: 6.2 g/dL — ABNORMAL LOW (ref 6.5–8.1)

## 2018-01-19 LAB — CBC
HCT: 40.4 % (ref 39.0–52.0)
Hemoglobin: 13.7 g/dL (ref 13.0–17.0)
MCH: 32.9 pg (ref 26.0–34.0)
MCHC: 33.9 g/dL (ref 30.0–36.0)
MCV: 96.9 fL (ref 78.0–100.0)
Platelets: 106 10*3/uL — ABNORMAL LOW (ref 150–400)
RBC: 4.17 MIL/uL — ABNORMAL LOW (ref 4.22–5.81)
RDW: 13.8 % (ref 11.5–15.5)
WBC: 5.2 10*3/uL (ref 4.0–10.5)

## 2018-01-19 SURGERY — REMOVAL PORT-A-CATH
Anesthesia: Monitor Anesthesia Care | Site: Chest | Laterality: Left

## 2018-01-19 MED ORDER — LIDOCAINE HCL (PF) 1 % IJ SOLN
INTRAMUSCULAR | Status: AC
  Filled 2018-01-19: qty 30

## 2018-01-19 MED ORDER — 0.9 % SODIUM CHLORIDE (POUR BTL) OPTIME
TOPICAL | Status: DC | PRN
  Administered 2018-01-19: 1000 mL

## 2018-01-19 MED ORDER — FENTANYL CITRATE (PF) 100 MCG/2ML IJ SOLN
INTRAMUSCULAR | Status: DC | PRN
  Administered 2018-01-19: 50 ug via INTRAVENOUS

## 2018-01-19 MED ORDER — ONDANSETRON HCL 4 MG/2ML IJ SOLN
INTRAMUSCULAR | Status: DC | PRN
  Administered 2018-01-19: 4 mg via INTRAVENOUS

## 2018-01-19 MED ORDER — FENTANYL CITRATE (PF) 250 MCG/5ML IJ SOLN
INTRAMUSCULAR | Status: AC
  Filled 2018-01-19: qty 5

## 2018-01-19 MED ORDER — CEFAZOLIN SODIUM-DEXTROSE 2-4 GM/100ML-% IV SOLN
2.0000 g | INTRAVENOUS | Status: AC
  Administered 2018-01-19: 2 g via INTRAVENOUS
  Filled 2018-01-19: qty 100

## 2018-01-19 MED ORDER — PROPOFOL 500 MG/50ML IV EMUL
INTRAVENOUS | Status: DC | PRN
  Administered 2018-01-19: 100 ug/kg/min via INTRAVENOUS

## 2018-01-19 MED ORDER — LIDOCAINE HCL 1 % IJ SOLN
INTRAMUSCULAR | Status: DC | PRN
  Administered 2018-01-19: 30 mL

## 2018-01-19 MED ORDER — LACTATED RINGERS IV SOLN
INTRAVENOUS | Status: DC | PRN
  Administered 2018-01-19: 07:00:00 via INTRAVENOUS

## 2018-01-19 MED ORDER — PROPOFOL 10 MG/ML IV BOLUS
INTRAVENOUS | Status: AC
  Filled 2018-01-19: qty 20

## 2018-01-19 MED ORDER — PHENYLEPHRINE HCL 10 MG/ML IJ SOLN
INTRAVENOUS | Status: DC | PRN
  Administered 2018-01-19: 30 ug/min via INTRAVENOUS

## 2018-01-19 SURGICAL SUPPLY — 30 items
ADH SKN CLS APL DERMABOND .7 (GAUZE/BANDAGES/DRESSINGS) ×1
BLADE SURG 11 STRL SS (BLADE) ×2 IMPLANT
CANISTER SUCT 3000ML PPV (MISCELLANEOUS) ×2 IMPLANT
COVER SURGICAL LIGHT HANDLE (MISCELLANEOUS) ×2 IMPLANT
DERMABOND ADVANCED (GAUZE/BANDAGES/DRESSINGS) ×1
DERMABOND ADVANCED .7 DNX12 (GAUZE/BANDAGES/DRESSINGS) ×1 IMPLANT
DRAPE LAPAROTOMY T 102X78X121 (DRAPES) ×2 IMPLANT
ELECT REM PT RETURN 9FT ADLT (ELECTROSURGICAL) ×2
ELECTRODE REM PT RTRN 9FT ADLT (ELECTROSURGICAL) ×1 IMPLANT
GAUZE SPONGE 4X4 12PLY STRL (GAUZE/BANDAGES/DRESSINGS) ×2 IMPLANT
GLOVE BIO SURGEON STRL SZ 6.5 (GLOVE) ×5 IMPLANT
GLOVE BIO SURGEON STRL SZ7.5 (GLOVE) ×1 IMPLANT
GLOVE BIOGEL PI IND STRL 7.0 (GLOVE) IMPLANT
GLOVE BIOGEL PI IND STRL 8 (GLOVE) IMPLANT
GLOVE BIOGEL PI INDICATOR 7.0 (GLOVE) ×1
GLOVE BIOGEL PI INDICATOR 8 (GLOVE) ×1
GOWN STRL REUS W/ TWL LRG LVL3 (GOWN DISPOSABLE) ×2 IMPLANT
GOWN STRL REUS W/TWL LRG LVL3 (GOWN DISPOSABLE) ×4
KIT BASIN OR (CUSTOM PROCEDURE TRAY) ×2 IMPLANT
KIT TURNOVER KIT B (KITS) ×2 IMPLANT
NEEDLE 22X1 1/2 (OR ONLY) (NEEDLE) ×2 IMPLANT
NS IRRIG 1000ML POUR BTL (IV SOLUTION) ×2 IMPLANT
PACK GENERAL/GYN (CUSTOM PROCEDURE TRAY) ×2 IMPLANT
PAD ARMBOARD 7.5X6 YLW CONV (MISCELLANEOUS) ×4 IMPLANT
SUT VIC AB 3-0 SH 8-18 (SUTURE) ×2 IMPLANT
SUT VICRYL 4-0 PS2 18IN ABS (SUTURE) ×2 IMPLANT
SYR CONTROL 10ML LL (SYRINGE) ×2 IMPLANT
TOWEL GREEN STERILE (TOWEL DISPOSABLE) ×2 IMPLANT
TOWEL GREEN STERILE FF (TOWEL DISPOSABLE) ×2 IMPLANT
WATER STERILE IRR 1000ML POUR (IV SOLUTION) ×2 IMPLANT

## 2018-01-19 NOTE — Anesthesia Postprocedure Evaluation (Signed)
Anesthesia Post Note  Patient: Joseph [name]  Procedure(s) Performed: REMOVAL PORT-A-CATH (Left Chest)     Patient location during evaluation: PACU Anesthesia Type: MAC Level of consciousness: awake and alert Pain management: pain level controlled Vital Signs Assessment: post-procedure vital signs reviewed and stable Respiratory status: spontaneous breathing, nonlabored ventilation, respiratory function stable and patient connected to nasal cannula oxygen Cardiovascular status: stable and blood pressure returned to baseline Postop Assessment: no apparent nausea or vomiting Anesthetic complications: no    Last Vitals:  Vitals:   01/19/18 0915 01/19/18 0918  BP: 113/75 116/75  Pulse: 72 70  Resp: 15 16  Temp: (!) 36.4 C (!) 36.4 C  SpO2: 95% 96%    Last Pain:  Vitals:   01/19/18 0918  TempSrc:   PainSc: 0-No pain                 Jeanett Antonopoulos,JAMES TERRILL

## 2018-01-19 NOTE — Anesthesia Procedure Notes (Signed)
Procedure Name: MAC Date/Time: 01/19/2018 7:25 AM Performed by: Neldon Newport, CRNA Pre-anesthesia Checklist: Timeout performed, Patient being monitored, Emergency Drugs available, Suction available and Patient identified Oxygen Delivery Method: Simple face mask

## 2018-01-19 NOTE — H&P (Signed)
[address]       [place], [address]             [phone]                    Quadry J Stairs [facility] Medical Record #[id number] Date of Birth: 08/20/1941    Referring: Curt Bears, MD Primary Care: [name], MD Primary Cardiologist: No primary care provider on file.  Chief Complaint:    Chief Complaint  Patient presents with  . Lung Cancer    Remove  Port-A- Cath   Chief Complaint:    Remove  Port-A- Cath  History of Present Illness:    Joseph [name] 77 y.o. male was  seen in the office  today for consideration of removal of a Port-A-Cath.  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  Dr. Arlyce Dice placed a left subclavian vein Port-A-Cath at that time that was used for his treatment with combined radiation and chemotherapy.  The patient has been followed in the oncology clinic since that time without evidence of recurrence.  His Port-A-Cath is occasionally used when he gets a CT scan, but he has adequate vein for peripheral sticks.  He brought up that he was tired of having to go get it flushed every 6 weeks.  Patient was referred by Dr. Julien Nordmann for removal of the Port-A-Cath.   Patient does have underlying emphysematous changes in the lung, he has had environmental exposures both of tobacco and also powdered metals used as electronic coatings.  He quit smoking at the time of his diagnosis of lung cancer and has remained smoke-free since that time.  Patient has had a history of atrial fibrillation in the past, he is not currently on any anticoagulation   Cancer Staging Cancer of right lung ([place]) Staging form: Lung, AJCC 7th Edition - Clinical: Stage IIIA (T2a, N2, M0) - Signed by Curt Bears, MD on 08/06/2011 - Pathologic: No stage assigned - Unsigned   Current Activity/ Functional Status:  Patient is independent with mobility/ambulation, transfers, ADL's, IADL's.   Zubrod  Score: At the time of surgery this patient's most appropriate activity status/level should be described as: _0     0    Normal activity, no symptoms _1     1    Restricted in physical strenuous activity but ambulatory, able to do out light work _2     2    Ambulatory and capable of self care, unable to do work activities, up and about               >50 % of waking hours                              _3     3    Only limited self care, in bed greater than 50% of waking hours _4     4    Completely disabled, no self care, confined to bed or chair _5     5    Moribund   Past Medical History:  Diagnosis Date  . Acute on chronic diastolic CHF (congestive heart failure), NYHA class 3 ([place]) 07/23/2014  . Arthritis    left knee and back arthrtis  . Atrial fibrillation with rapid ventricular response ([place]) 07/04/2014  . CAD (coronary artery disease)   . Cataract   . Cirrhosis ([place])    By  radiography, seen on CT  . Diverticulosis   . Emphysema   . Enlarged prostate   . Head injury    teenager  . Headache    when younger but not any longer  . Heart murmur    teen  . History of abdominal aortic aneurysm (AAA)   . History of colon polyps 2006,2008  . History of transfusion 2015  . Hx of radiation therapy 09/02/11 to 10/19/11   chest  . Hx of radiation therapy   . Hyperlipidemia    takes Pravastatin  . Hypothyroidism    takes Synthroid daily  . Inguinal hernia   . Lung cancer ([place])    right upper lobe  . Personal history of adenomatous colonic polyps 07/04/2012   2006, adenomatous right colon polyp removed, then ablated in 2007 followup, 2008, adenomatous polyp of the right colon removed with hot biopsy forceps. All by Dr. Lajoyce Corners 01/03/2013 diminutive polyp ascending colon - adenoma    . Rosacea   . Shortness of breath    with exertion  . Small bowel obstruction ([place]) 07/05/2014  . Status post chemotherapy    last chemo 2013  . Umbilical hernia, incarcerated- surgery 07/10/14 07/05/2014    Past  Surgical History:  Procedure Laterality Date  . ABDOMINAL AORTAGRAM  10/20/2013   Procedure: ABDOMINAL Maxcine Ham;  Surgeon: Elam Dutch, MD;  Location: [facility] CATH LAB;  Service: Cardiovascular;;  . ABDOMINAL AORTIC ANEURYSM REPAIR N/A 10/31/2013   Procedure: aorto to left external iliac and right external iliac and left internal iliac, reimplantation of inferior mesenteric artery and ligation of left iliac artery aneursym;  Surgeon: Elam Dutch, MD;  Location: [facility];  Service: Vascular;  Laterality: N/A;  . ABDOMINAL WOUND DEHISCENCE N/A 11/10/2013   Procedure: ABDOMINAL WOUND CLOSURE ;  Surgeon: Serafina Mitchell, MD;  Location: [place];  Service: Vascular;  Laterality: N/A;  . bletharoplasty    . BOWEL RESECTION N/A 07/10/2014   Procedure: SMALL BOWEL RESECTION;  Surgeon: [name], MD;  Location: [facility];  Service: General;  Laterality: N/A;  . CARDIAC CATHETERIZATION  2003  . CATARACT EXTRACTION, BILATERAL    . COLONOSCOPY W/ BIOPSIES     multiple   . ESOPHAGOGASTRODUODENOSCOPY  2006  . FINGER SURGERY  2008   left pointer finger  . HERNIA REPAIR  70's   x3 2 Inguinal  . INCISIONAL HERNIA REPAIR N/A 07/10/2014   Procedure: HERNIA REPAIR INCISIONAL;  Surgeon: [name], MD;  Location: [facility];  Service: General;  Laterality: N/A;  . KNEE ARTHROSCOPY  2008   left knee  . LAPAROTOMY N/A 07/10/2014   Procedure: EXPLORATORY LAPAROTOMY;  Surgeon: [name], MD;  Location: [facility];  Service: General;  Laterality: N/A;  . LOWER EXTREMITY ANGIOGRAM Bilateral 10/20/2013   Procedure: LOWER EXTREMITY ANGIOGRAM;  Surgeon: Elam Dutch, MD;  Location: [facility] CATH LAB;  Service: Cardiovascular;  Laterality: Bilateral;  . LYSIS OF ADHESION N/A 07/10/2014   Procedure: LYSIS OF ADHESION - 90 minutes;  Surgeon: [name], MD;  Location: [facility];  Service: General;  Laterality: N/A;  . PORTACATH PLACEMENT  08/10/2011   Procedure: INSERTION PORT-A-CATH;  Surgeon: Pierre Bali, MD;  Location:  [facility];  Service: Thoracic;  Laterality: Left;  Left Subclavian  . PTOSIS REPAIR Bilateral 12-02-2015  . THROMBECTOMY ILIAC ARTERY Bilateral 10/31/2013   Procedure: THROMBECTOMY ILIAC ARTERY;  Surgeon: Elam Dutch, MD;  Location: [facility];  Service: Vascular;  Laterality:  Bilateral;  . TONSILLECTOMY     as a child  . TOTAL KNEE ARTHROPLASTY Left 08/03/2017   Procedure: LEFT TOTAL KNEE ARTHROPLASTY;  Surgeon: [name], MD;  Location: [facility];  Service: Orthopedics;  Laterality: Left;  Marland Kitchen VASECTOMY  70's  . VENTRAL HERNIA REPAIR N/A 04/04/2015   Procedure: OPEN REPAIR OF RECURRENT ABDOMINAL WALL HERNIA WITH MESH, COMPLEX LYSIS OF ADHESIONS X1 HR TAR COMPONENT SEPARATION;  Surgeon: Greer Pickerel, MD;  Location: [facility];  Service: General;  Laterality: N/A;    Family History  Problem Relation Age of Onset  . Cancer Father   . Diabetes Father   . Hyperlipidemia Mother   . Hypertension Mother   . Hyperlipidemia Brother   . AAA (abdominal aortic aneurysm) Brother   . Hypertension Brother   . Hyperlipidemia Sister   . Hypertension Sister   . Anesthesia problems Neg Hx   . Hypotension Neg Hx   . Malignant hyperthermia Neg Hx   . Pseudochol deficiency Neg Hx   . Colon cancer Neg Hx      Social History   Tobacco Use  Smoking Status Former Smoker  . Packs/day: 1.50  . Years: 40.00  . Pack years: 60.00  . Types: Cigarettes  . Last attempt to quit: 07/02/2011  . Years since quitting: 6.5  Smokeless Tobacco Former Systems developer    Social History   Substance and Sexual Activity  Alcohol Use Yes   Comment: social - 2 MONTHLY     No Known Allergies  Current Facility-Administered Medications  Medication Dose Route Frequency Provider Last Rate Last Dose  . 0.9 % irrigation (POUR BTL)    PRN Grace Isaac, MD   1,000 mL at 01/19/18 0709  . ceFAZolin (ANCEF) IVPB 2g/100 mL premix  2 g Intravenous 30 min Pre-Op Grace Isaac, MD      . lidocaine (XYLOCAINE) 1 % (with pres)  injection    PRN Grace Isaac, MD   30 mL at 01/19/18 9528    Pertinent items are noted in HPI.   Review of Systems:     Cardiac Review of Systems: [Y] = yes  or   [ N ] = no   Chest Pain [  n  ]  Resting SOB Florencio.Farrier   ] Exertional SOB  Blue.Reese  ]  Orthopnea Florencio.Farrier  ]   Pedal Edema [ n  ]    Palpitations [ n ] Syncope  [ n ]   Presyncope [  n ]   General Review of Systems: [Y] = yes [  ]=no Constitional: recent weight change [  ];  Wt loss over the last 3 months [   ] anorexia [  ]; fatigue [  ]; nausea [  ]; night sweats [  ]; fever [  ]; or chills [  ];           Eye : blurred vision [  ]; diplopia [   ]; vision changes [  ];  Amaurosis fugax[  ]; Resp: cough [  ];  wheezing[  ];  hemoptysis[  ]; shortness of breath[  ]; paroxysmal nocturnal dyspnea[  ]; dyspnea on exertion[  ]; or orthopnea[  ];  GI:  gallstones[  ], vomiting[  ];  dysphagia[  ]; melena[  ];  hematochezia [  ]; heartburn[  ];   Hx of  Colonoscopy[  ]; GU: kidney stones [  ]; hematuria[  ];   dysuria [  ];  nocturia[  ];  history of     obstruction [  ]; urinary frequency [  ]             Skin: rash, swelling[  ];, hair loss[  ];  peripheral edema[  ];  or itching[  ]; Musculosketetal: myalgias[  ];  joint swelling[  ];  joint erythema[  ];  joint pain[  ];  back pain[  ];  Heme/Lymph: bruising[  ];  bleeding[  ];  anemia[  ];  Neuro: TIA[  ];  headaches[  ];  stroke[  ];  vertigo[  ];  seizures[  ];   paresthesias[  ];  difficulty walking[  ];  Psych:depression[  ]; anxiety[  ];  Endocrine: diabetes[  ];  thyroid dysfunction[  ];  Immunizations: Flu up to date [  y]; Pneumococcal up to date Blue.Reese  ];  Other:    PHYSICAL EXAMINATION: BP 119/71   Pulse 70   Temp 98.3 F (36.8 C) (Oral)   Resp 20   Ht 6' (1.829 m)   Wt 210 lb (95.3 kg)   SpO2 95%   BMI 28.48 kg/m  General appearance: alert and cooperative Head: Normocephalic, without obvious abnormality, atraumatic Neck: no adenopathy, no carotid bruit, no JVD, supple,  symmetrical, trachea midline and thyroid not enlarged, symmetric, no tenderness/mass/nodules Lymph nodes: Cervical, supraclavicular, and axillary nodes normal. Resp: clear to auscultation bilaterally Back: symmetric, no curvature. ROM normal. No CVA tenderness. Cardio: regular rate and rhythm, S1, S2 normal, no murmur, click, rub or gallop GI: soft, non-tender; bowel sounds normal; no masses,  no organomegaly and Previous abdominal incisional hernia repair intact Extremities: extremities normal, atraumatic, no cyanosis or edema and Homans sign is negative, no sign of DVT Neurologic: Grossly normal Left subclavian vein port is present   Diagnostic Studies & Laboratory data:     Recent Radiology Findings:   Ct Chest W Contrast  Result Date: 01/08/2018 CLINICAL DATA:  Followup lung cancer. EXAM: CT CHEST WITH CONTRAST TECHNIQUE: Multidetector CT imaging of the chest was performed during intravenous contrast administration. CONTRAST:  42m OMNIPAQUE IOHEXOL 300 MG/ML  SOLN COMPARISON:  01/06/2017 FINDINGS: Cardiovascular: Normal heart size. No pericardial effusion. Aortic atherosclerosis. Calcifications in the RCA, LAD coronary arteries noted. Mediastinum/Nodes: Thyroid gland is not visualized. The trachea appears patent and is midline. Normal appearance of the esophagus. Posterior mediastinal lymph node adjacent to the esophagus and descending aorta measures 1.1 cm, image 137/2. Previously 1 cm. No additional enlarged mediastinal or hilar lymph nodes. Lungs/Pleura: No pleural effusion identified. Advanced changes of emphysema. Paramediastinal radiation change within the right lung is again noted. Perifissural right upper lobe nodule measures 0.7 cm, image 78/5. Previously 0.9 cm Subpleural thickening along the left heart border within the lingula measures 6 mm in thickness, image 47/6. Previously 3 mm. Upper Abdomen: The liver appears cirrhotic with a nodular contour. No focal liver abnormalities noted.  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 Musculoskeletal: Spondylosis in the thoracic spine. No suspicious bone lesions. IMPRESSION: 1. Stable exam. No specific findings identified to suggest recurrent tumor or metastatic disease. 2. Mild subpleural thickening within the lingula has increased from previous exam. Nonspecific but worthy of attention on follow-up imaging. 3. Unchanged borderline enlarged posterior mediastinal node measuring 1.1 cm. 4. Aortic Atherosclerosis (ICD10-I70.0) and Emphysema (ICD10-J43.9). RCA and LAD coronary artery atherosclerotic calcifications noted. 5. 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 Electronically Signed   By: TLovena Le  Clovis Riley M.D.   On: 01/08/2018 19:07     I have independently reviewed the above radiology studies  and reviewed the findings with the patient.   Recent Lab Findings: Lab Results  Component Value Date   WBC 5.2 01/19/2018   HGB 13.7 01/19/2018   HCT 40.4 01/19/2018   PLT PENDING 01/19/2018   GLUCOSE 99 01/19/2018   CHOL 188 10/24/2014   TRIG 195 (H) 10/24/2014   HDL 35 (L) 10/24/2014   LDLCALC 114 (H) 10/24/2014   ALT 21 01/19/2018   AST 33 01/19/2018   NA 142 01/19/2018   K 3.8 01/19/2018   CL 111 01/19/2018   CREATININE 0.93 01/19/2018   BUN 12 01/19/2018   CO2 24 01/19/2018   TSH 0.587 10/21/2016   INR 1.08 07/21/2017      Assessment / Plan:   Patient with history of stage IIIa non-small cell lung cancer treated with combined radiation chemotherapy in 2012, now without recurrence on CT scan.  Patient referred for removal of Port-A-Cath I discussed with the patient the procedure to remove the Port-A-Cath under MAC anesthesia, we also discussed both the risk of leaving it in place and some risk of removal.  He is willing to proceed .    The goals risks and alternatives of the planned surgical procedure Procedure(s): REMOVAL PORT-A-CATH (Left)   have been discussed with the patient in detail. The risks of the procedure including death, infection, stroke, myocardial infarction, bleeding, blood transfusion have all been discussed specifically.  I have quoted Doniven Vanpatten Para a <1%  of perioperative mortality and a complication rate as high as 5 %. The patient's questions have been answered.Drew Lips Almond is willing  to proceed with the planned procedure.   Grace Isaac MD      [address] [place] [address] Office [phone]   Beeper [phone]  01/19/2018 7:14 AM

## 2018-01-19 NOTE — Discharge Instructions (Signed)
Tissue Adhesive Wound Care Some cuts and wounds can be closed with skin glue (tissue adhesive). Skin glue holds the skin together and helps your wound heal faster. Skin glue goes away on its own as your wound gets better. Follow these instructions at home: Wound care   Showers are allowed 24 hours after treatment. Do not soak the wound in water. Do not take baths, swim, or use hot tubs. Do not use soaps or creams on your wound.  If a bandage (dressing) was put on the wound: ? Wash your hands with soap and water before you change your bandage. ? Change the bandage as often as told by your doctor. ? Leave skin glue in place. It will fall off on its own after 7-10 days. ? Keep the bandage dry.  Do not scratch, rub, or pick at the skin glue.     Do not put tape over the skin glue. The skin glue could come off when you take the tape off.  Protect the wound from another injury.  Protect the wound from sun and tanning beds. General instructions  Take over-the-counter and prescription medicines only as told by your doctor.  Keep all follow-up visits as told by your doctor. This is important. Get help right away if:  Your wound is red, puffy (swollen), hot, or tender.  You get a rash after the glue is put on.  You have more pain in the wound.  You have a red streak going away from the wound.  You have yellowish-white fluid (pus) coming from the wound.  You have more bleeding.  You have a fever.  You have chills and you start to shake.  You notice a bad smell coming from the wound.  Your wound or skin glue breaks open. This information is not intended to replace advice given to you by your health care provider. Make sure you discuss any questions you have with your health care provider. Document Released: 05/26/2008 Document Revised: 07/10/2016 Document Reviewed: 07/10/2016 Elsevier Interactive Patient Education  2017 [employer].

## 2018-01-19 NOTE — Anesthesia Preprocedure Evaluation (Addendum)
Anesthesia Evaluation  Patient identified by MRN, date of birth, ID band Patient awake    Reviewed: Allergy & Precautions, NPO status , Patient's Chart, lab work & pertinent test results  Airway Mallampati: I  TM Distance: >3 FB Neck ROM: Full    Dental no notable dental hx. (+) Dental Advidsory Given   Pulmonary shortness of breath, COPD, former smoker,    breath sounds clear to auscultation       Cardiovascular hypertension, Pt. on home beta blockers + CAD, + Peripheral Vascular Disease and +CHF   Rhythm:Regular Rate:Normal     Neuro/Psych  Headaches,    GI/Hepatic   Endo/Other  Hypothyroidism   Renal/GU Renal disease     Musculoskeletal  (+) Arthritis ,   Abdominal   Peds  Hematology   Anesthesia Other Findings   Reproductive/Obstetrics                           Anesthesia Physical Anesthesia Plan  ASA: IV  Anesthesia Plan: MAC   Post-op Pain Management:    Induction: Intravenous  PONV Risk Score and Plan: 2 and Ondansetron  Airway Management Planned: Natural Airway and Simple Face Mask  Additional Equipment:   Intra-op Plan:   Post-operative Plan:   Informed Consent: I have reviewed the patients History and Physical, chart, labs and discussed the procedure including the risks, benefits and alternatives for the proposed anesthesia with the patient or authorized representative who has indicated his/her understanding and acceptance.   Dental Advisory Given  Plan Discussed with:   Anesthesia Plan Comments:        Anesthesia Quick Evaluation

## 2018-01-19 NOTE — Brief Op Note (Signed)
      [address]       [place] [address]             [phone]      01/19/2018  8:01 AM  PATIENT:  Joseph [name]  77 y.o. male  PRE-OPERATIVE DIAGNOSIS:  HX OF LUNG CANCER, unused port left   POST-OPERATIVE DIAGNOSIS:  Same   PROCEDURE:  Procedure(s): REMOVAL PORT-A-CATH (Left)  SURGEON:  Surgeon(s) and Role:    * Grace Isaac, MD - Primary   ANESTHESIA:   general  EBL:  None   BLOOD ADMINISTERED:none  DRAINS: none   LOCAL MEDICATIONS USED:  LIDOCAINE  and Amount: 8 ml  SPECIMEN:  No Specimen  DISPOSITION OF SPECIMEN:  N/A  COUNTS:  YES   DICTATION: .Dragon Dictation  PLAN OF CARE: Discharge to home after PACU  PATIENT DISPOSITION:  PACU - hemodynamically stable.   Delay start of Pharmacological VTE agent (>24hrs) due to surgical blood loss or risk of bleeding: not applicable

## 2018-01-19 NOTE — Transfer of Care (Signed)
Immediate Anesthesia Transfer of Care Note  Patient: Joseph [name]  Procedure(s) Performed: REMOVAL PORT-A-CATH (Left Chest)  Patient Location: PACU  Anesthesia Type:MAC  Level of Consciousness: awake, alert  and oriented  Airway & Oxygen Therapy: Patient Spontanous Breathing and Patient connected to nasal cannula oxygen  Post-op Assessment: Report given to RN, Post -op Vital signs reviewed and stable and Patient moving all extremities X 4  Post vital signs: Reviewed and stable  Last Vitals:  Vitals Value Taken Time  BP    Temp    Pulse    Resp    SpO2      Last Pain:  Vitals:   01/19/18 0635  TempSrc:   PainSc: 0-No pain         Complications: No apparent anesthesia complications

## 2018-01-20 ENCOUNTER — Encounter (HOSPITAL_COMMUNITY): Payer: Self-pay | Admitting: Cardiothoracic Surgery

## 2018-01-20 NOTE — Op Note (Signed)
NAME: ANSLEY, STANWOOD MEDICAL RECORD ZD:[id number] ACCOUNT [id number] DATE OF BIRTH:04-16-1941 FACILITY: [facility] LOCATION: [facility] PHYSICIAN:Kamiryn Bezanson Maryruth Bun, MD  OPERATIVE REPORT  DATE OF PROCEDURE:  01/19/2018  PREOPERATIVE DIAGNOSIS:  History of stage III lung cancer with left subclavian vein Port-A-Cath in place, no longer needed.  POSTOPERATIVE DIAGNOSIS: History of stage III lung cancer with left subclavian vein Port-A-Cath in place, no longer needed.  SURGICAL PROCEDURE:  Removal of Port-A-Cath.  SURGEON:   Lanelle Bal, MD  BRIEF HISTORY:  The patient is a 77 year old male referred by Dr. Earlie Server for removal of Port-A-Cath since the patient was no longer using it.  Removal of the device was discussed with the patient who agreed and signed informed consent.  DESCRIPTION OF PROCEDURE:  Under light MAC anesthesia, the patient's chest was prepped with Betadine, draped in a sterile manner.  The port had previously been placed in the left subclavian vein.  Appropriate timeout was performed.  After this, the 1%, 8  mL total of lidocaine was infiltrated in the subcutaneous tissue around the port.  The previous incision was reopened, dissected down to the port.  Small capsule around the port was opened.  The tacking sutures holding the port in place were divided.   The patient's head was put in a down position and the port was removed in its entirety without difficulty.  Pressure was held over the left infraclavicular area.  The fibrous tract of the port was oversewn with 3-0 Vicryl pop-off suture.  There was no  evidence of infection around the port.  This incision was then closed in the subcutaneous tissue with interrupted 3-0 Vicryl and a 4-0 subcuticular stitch was placed in the skin edge.  Dermabond was applied.    The patient's blood loss was none.  Sponge and needle count was reported as correct.  The patient tolerated the procedure without difficulty and was transferred  to the recovery room for postoperative observation.  AN/NUANCE  D:01/20/2018 T:01/20/2018 JOB:[id number]

## 2018-02-03 ENCOUNTER — Ambulatory Visit: Payer: PPO | Admitting: Cardiology

## 2018-02-07 ENCOUNTER — Ambulatory Visit (INDEPENDENT_AMBULATORY_CARE_PROVIDER_SITE_OTHER): Payer: PPO | Admitting: Internal Medicine

## 2018-02-07 DIAGNOSIS — Z86010 Personal history of colonic polyps: Secondary | ICD-10-CM | POA: Diagnosis not present

## 2018-02-07 DIAGNOSIS — K7460 Unspecified cirrhosis of liver: Secondary | ICD-10-CM | POA: Diagnosis not present

## 2018-02-07 NOTE — Assessment & Plan Note (Signed)
Repeat colonoscopy The risks and benefits as well as alternatives of endoscopic procedure(s) have been discussed and reviewed. All questions answered. The patient agrees to proceed.  

## 2018-02-07 NOTE — Assessment & Plan Note (Addendum)
Stable UTD w/ imaging - no liver lesions on 12/2017 Chest CT Continue f/u PCP See me prn I suspect Screen for varices again (combine w/ colonoscopy)

## 2018-02-07 NOTE — Progress Notes (Signed)
Joseph [name] 77 y.o. [birth year] [id number]  Assessment & Plan:  Hepatic cirrhosis Stable UTD w/ imaging - no liver lesions on 12/2017 Chest CT Continue f/u PCP See me prn I suspect Screen for varices again (combine w/ colonoscopy)  Hx of adenomatous colonic polyps Repeat colonoscopy The risks and benefits as well as alternatives of endoscopic procedure(s) have been discussed and reviewed. All questions answered. The patient agrees to proceed.   I appreciate the opportunity to care for this patient. CC: [name], MD     Subjective:   Chief Complaint: Follow-up of cirrhosis, arrange repeat colonoscopy and endoscopy  HPI The patient is here for follow-up of his cirrhosis and history of adenomatous colon polyps.  He is due for a repeat colonoscopy because of prior adenomas last 5 years ago.  He has not had any problems related to his cirrhosis he is tolerated a knee replacement.  I have reviewed primary care notes from December 08, 2017.  Laboratory testing shows persistent thrombocytopenia with platelet count 86 but a hemoglobin 16.3, he had a normal INR and he has normal LFTs and kidney function.  He had his Port-A-Cath removed by Dr. Pia Mau last month.  He had an EGD in 2017 without any signs of varices and we were planning 1 for next year he is wondering if he can combine an EGD and colonoscopy at this point.  He had a CT of the chest as part of lung cancer follow-up last month showed his mildly cirrhotic liver but no lesions.  He is not noticing any swelling increasing abdominal girth or excessive weight gain.  He is thinking of selling his house and downsizing.  Plans to go back to [facility] for a visit in taking a [employer] game this summer.   Wt Readings from Last 3 Encounters:  02/07/18 218 lb 2 oz (98.9 kg)  01/19/18 210 lb (95.3 kg)  01/12/18 212 lb (96.2 kg)    No Known Allergies Current Meds  Medication Sig  . albuterol (PROVENTIL HFA;VENTOLIN HFA)  108 (90 Base) MCG/ACT inhaler Inhale 2 puffs into the lungs every 6 (six) hours as needed for wheezing or shortness of breath.   Marland Kitchen aspirin EC 81 MG tablet Take 81 mg by mouth daily.  . Carboxymethylcellul-Glycerin (LUBRICATING EYE DROPS OP) Place 2 drops into both eyes daily as needed (dry eyes).  . furosemide (LASIX) 20 MG tablet Take 1 tablet (20 mg total) by mouth daily.  . Garlic 759 MG TABS Take 600 mg by mouth 2 (two) times daily.   Marland Kitchen ibuprofen (ADVIL,MOTRIN) 200 MG tablet Take 400 mg by mouth daily as needed (back pain).  . metoprolol tartrate (LOPRESSOR) 25 MG tablet TAKE 1 TABLET (25 MG TOTAL) BY MOUTH 2 (TWO) TIMES DAILY.  . metroNIDAZOLE (METROCREAM) 0.75 % cream Apply 1 application topically 2 (two) times daily as needed (jock itch).   . Multiple Vitamin (MULTIVITAMIN WITH MINERALS) TABS tablet Take 1 tablet by mouth 2 (two) times daily.   . Multiple Vitamins-Minerals (PRESERVISION AREDS 2+MULTI VIT PO) Take 1 capsule by mouth 2 (two) times daily.  Marland Kitchen nystatin cream (MYCOSTATIN) Apply 1 application topically 2 (two) times daily as needed (jock itch).   . Probiotic Product (PROBIOTIC DAILY PO) Take 1 capsule by mouth daily. [place] [facility] Vitamins Products (PROSTATE PO) Take 1 tablet by mouth 2 (two) times daily.  Marland Kitchen SYNTHROID 175 MCG tablet Take 175 mcg by mouth daily before breakfast.   . tiotropium (SPIRIVA) 18  MCG inhalation capsule Place 18 mcg into inhaler and inhale daily.   Marland Kitchen triamcinolone cream (KENALOG) 0.1 % Apply 1 application topically 3 times/day as needed-between meals & bedtime (jock itch).   . vitamin B-12 (CYANOCOBALAMIN) 1000 MCG tablet Take 1,000 mcg by mouth daily.   Past Medical History:  Diagnosis Date  . AAA (abdominal aortic aneurysm) ([place])   . Acute on chronic diastolic CHF (congestive heart failure), NYHA class 3 ([place]) 07/23/2014  . Arthritis    left knee and back arthrtis  . Atrial fibrillation with rapid ventricular response ([place]) 07/04/2014  .  CAD (coronary artery disease)   . Cataract   . Cirrhosis ([place])    By radiography, seen on CT  . COPD (chronic obstructive pulmonary disease) ([place])   . Diverticulosis   . Emphysema   . Enlarged prostate   . Head injury    teenager  . Headache    when younger but not any longer  . Heart murmur    teen  . History of abdominal aortic aneurysm (AAA)   . History of colon polyps 2006,2008  . History of transfusion 2015  . Hx of radiation therapy 09/02/11 to 10/19/11   chest  . Hx of radiation therapy   . Hyperlipidemia    takes Pravastatin  . Hypothyroidism    takes Synthroid daily  . Inguinal hernia   . Lung cancer ([place]) 07/2011   right upper lobe, non -small cell lung cancer, did chemo  . MI (myocardial infarction) [facility])    old MI on EKG  . Personal history of adenomatous colonic polyps 07/04/2012   2006, adenomatous right colon polyp removed, then ablated in 2007 followup, 2008, adenomatous polyp of the right colon removed with hot biopsy forceps. All by Dr. Lajoyce Corners 01/03/2013 diminutive polyp ascending colon - adenoma    . Rosacea   . Shortness of breath    with exertion  . Small bowel obstruction ([place]) 07/05/2014  . Status post chemotherapy    last chemo 2013  . Thrombocytopenia ([place])   . Umbilical hernia, incarcerated- surgery 07/10/14 07/05/2014   Past Surgical History:  Procedure Laterality Date  . ABDOMINAL AORTAGRAM  10/20/2013   Procedure: ABDOMINAL Maxcine Ham;  Surgeon: Elam Dutch, MD;  Location: [facility] CATH LAB;  Service: Cardiovascular;;  . ABDOMINAL AORTIC ANEURYSM REPAIR N/A 10/31/2013   Procedure: aorto to left external iliac and right external iliac and left internal iliac, reimplantation of inferior mesenteric artery and ligation of left iliac artery aneursym;  Surgeon: Elam Dutch, MD;  Location: [place];  Service: Vascular;  Laterality: N/A;  . ABDOMINAL WOUND DEHISCENCE N/A 11/10/2013   Procedure: ABDOMINAL WOUND CLOSURE ;  Surgeon: Serafina Mitchell, MD;  Location: [place];  Service: Vascular;  Laterality: N/A;  . bletharoplasty    . BOWEL RESECTION N/A 07/10/2014   Procedure: SMALL BOWEL RESECTION;  Surgeon: [name], MD;  Location: [place];  Service: General;  Laterality: N/A;  . CARDIAC CATHETERIZATION  2003  . CATARACT EXTRACTION, BILATERAL    . COLONOSCOPY W/ BIOPSIES     multiple   . ESOPHAGOGASTRODUODENOSCOPY  2006  . FINGER SURGERY  2008   left pointer finger  . HERNIA REPAIR  70's   x3 2 Inguinal  . INCISIONAL HERNIA REPAIR N/A 07/10/2014   Procedure: HERNIA REPAIR INCISIONAL;  Surgeon: [name], MD;  Location: [place];  Service: General;  Laterality: N/A;  . KNEE ARTHROSCOPY  2008   left knee  .  LAPAROTOMY N/A 07/10/2014   Procedure: EXPLORATORY LAPAROTOMY;  Surgeon: [name], MD;  Location: [place];  Service: General;  Laterality: N/A;  . LOWER EXTREMITY ANGIOGRAM Bilateral 10/20/2013   Procedure: LOWER EXTREMITY ANGIOGRAM;  Surgeon: Elam Dutch, MD;  Location: [facility] CATH LAB;  Service: Cardiovascular;  Laterality: Bilateral;  . LYSIS OF ADHESION N/A 07/10/2014   Procedure: LYSIS OF ADHESION - 90 minutes;  Surgeon: [name], MD;  Location: [place];  Service: General;  Laterality: N/A;  . PORT-A-CATH REMOVAL Left 01/19/2018   Procedure: REMOVAL PORT-A-CATH;  Surgeon: Grace Isaac, MD;  Location: [place];  Service: Thoracic;  Laterality: Left;  . PORTACATH PLACEMENT  08/10/2011   Procedure: INSERTION PORT-A-CATH;  Surgeon: Pierre Bali, MD;  Location: [place];  Service: Thoracic;  Laterality: Left;  Left Subclavian  . PTOSIS REPAIR Bilateral 12-02-2015  . THROMBECTOMY ILIAC ARTERY Bilateral 10/31/2013   Procedure: THROMBECTOMY ILIAC ARTERY;  Surgeon: Elam Dutch, MD;  Location: [place];  Service: Vascular;  Laterality: Bilateral;  . TONSILLECTOMY     as a child  . TOTAL KNEE ARTHROPLASTY Left 08/03/2017   Procedure: LEFT TOTAL KNEE ARTHROPLASTY;  Surgeon: [name], MD;  Location: [place];  Service: Orthopedics;   Laterality: Left;  Marland Kitchen VASECTOMY  70's  . 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   Review of Systems As per HPI  Objective:   Physical Exam @BP  114/70 (BP Location: Left Arm, Patient Position: Sitting, Cuff Size: Normal)   Pulse 80   Ht 5' 10.5" (1.791 m) Comment: height measured without shoes  Wt 218 lb 2 oz (98.9 kg)   BMI 30.86 kg/m @  General:  NAD Eyes:   anicteric Lungs:  clear Heart::  S1S2 no rubs, murmurs or gallops Abdomen:  soft and nontender, BS+ Ext:   no edema, cyanosis or clubbing    Data Reviewed:  See HPI

## 2018-02-07 NOTE — Patient Instructions (Signed)
  You have been scheduled for an endoscopy and colonoscopy. Please follow the written instructions given to you at your visit today. Please pick up your prep supplies at the pharmacy. If you use inhalers (even only as needed), please bring them with you on the day of your procedure. Your physician has requested that you go to www.startemmi.com and enter the access code given to you at your visit today. This web site gives a general overview about your procedure. However, you should still follow specific instructions given to you by our office regarding your preparation for the procedure.    I appreciate the opportunity to care for you. Silvano Rusk, MD, [facility]

## 2018-02-14 ENCOUNTER — Ambulatory Visit (INDEPENDENT_AMBULATORY_CARE_PROVIDER_SITE_OTHER): Payer: PPO

## 2018-02-14 DIAGNOSIS — Z4889 Encounter for other specified surgical aftercare: Secondary | ICD-10-CM | POA: Diagnosis not present

## 2018-02-28 ENCOUNTER — Encounter: Payer: Self-pay | Admitting: Cardiology

## 2018-03-14 ENCOUNTER — Encounter: Payer: Self-pay | Admitting: Cardiology

## 2018-03-14 ENCOUNTER — Ambulatory Visit: Payer: PPO | Admitting: Cardiology

## 2018-03-14 VITALS — BP 112/74 | HR 78 | Ht 70.5 in | Wt 214.6 lb

## 2018-03-14 DIAGNOSIS — I2583 Coronary atherosclerosis due to lipid rich plaque: Secondary | ICD-10-CM

## 2018-03-14 DIAGNOSIS — K7460 Unspecified cirrhosis of liver: Secondary | ICD-10-CM | POA: Diagnosis not present

## 2018-03-14 DIAGNOSIS — I2510 Atherosclerotic heart disease of native coronary artery without angina pectoris: Secondary | ICD-10-CM | POA: Diagnosis not present

## 2018-03-14 DIAGNOSIS — I739 Peripheral vascular disease, unspecified: Secondary | ICD-10-CM

## 2018-03-14 DIAGNOSIS — E785 Hyperlipidemia, unspecified: Secondary | ICD-10-CM | POA: Diagnosis not present

## 2018-03-14 DIAGNOSIS — Z789 Other specified health status: Secondary | ICD-10-CM | POA: Diagnosis not present

## 2018-03-14 NOTE — Patient Instructions (Addendum)
Medication Instructions:  Your physician recommends that you continue on your current medications as directed. Please refer to the Current Medication list given to you today.   Labwork: None    You have been referred to [facility] to see the Pharmacist as soon as appt available      Testing/Procedures: None  Follow-Up: Your physician wants you to follow-up in: 6 Month with Dr. Meda Coffee. You will receive a reminder letter in the mail two months in advance. If you don't receive a letter, please call our office to schedule the follow-up appointment.  Appointment with [facility] to see the Pharmacist as soon as appt is available   If you need a refill on your cardiac medications before your next appointment, please call your pharmacy.

## 2018-03-14 NOTE — Progress Notes (Signed)
Cardiology Office Note:    Date:  03/16/2018   ID:  Zan, Orlick Aug 25, 1941, MRN [id number]  PCP:  [name], MD  Cardiologist:  Ena Dawley, MD   Referring MD: [name], MD   Chief complain: 9 months follow up  History of Present Illness:    Joseph [name] is a 77 y.o. male with a hx of hyperlipidemia, known nonobstructive coronary artery disease was originally referred to [place] for preop evaluation prior to 4.4 cm AAA repair. Approximately 10 years ago he was evaluated for coronary artery disease because of abnormal EKG that was followed by abnormal stress test and a cardiac catheterization that only showed minimal atherosclerotic plaque. The patient also has a history of lung cancer for which she has been treated with chemotherapy and radiation therapy in 2012 and is currently in remission. The patient quit smoking 2 years ago and is being treated for COPD and emphysema with Spiriva.   On 10/31/2013 the patient underwent Aorto to left and right external iliac bypass, left internal iliac artery bypass, ligation right internal iliac aneurysm, reimplantation of inferior mesenteric artery. Thrombectomy of left and right iliac and femoral arteries. This was complicated with respiratory failure in addition to acute blood loss anemia and the patient received 6 units of FFP and 4 units of PRBCs in addition to 2 units of platelets during his admission. He was intubated and sedated for several days. He is feeling much better today and recovered very well. He denied having any significant chest pain, shortness of breath, cough or hemoptysis. He feels overall very tired and hasn't been back to preop baseline yet. He states that post surgery he retained 20 lbs of fluids but now is bak to baseline. Has some residual minimal LE edema.   He has known hyperlipidemia however he developed liver enzymes elevation after his chemotherapy and is being followed a GI specialist Dr.  Charlott Holler, he has never been started on statins. He denies any alcohol intake.  03/14/2018 - this is a 9 months follow up, he is doing well, denies any chest pain, DOE other than on moderate exercise, denies palpitations, dizziness or syncope. He has been complaint with his meds . His LFTs are now normalized. No claudications.   Past Medical History:  Diagnosis Date  . AAA (abdominal aortic aneurysm) ([place])   . Acute on chronic diastolic CHF (congestive heart failure), NYHA class 3 ([place]) 07/23/2014  . Arthritis    left knee and back arthrtis  . Atrial fibrillation with rapid ventricular response ([place]) 07/04/2014  . CAD (coronary artery disease)   . Cataract   . Cirrhosis ([place])    By radiography, seen on CT  . COPD (chronic obstructive pulmonary disease) ([place])   . Diverticulosis   . Emphysema   . Enlarged prostate   . Head injury    teenager  . Headache    when younger but not any longer  . Heart murmur    teen  . History of abdominal aortic aneurysm (AAA)   . History of colon polyps 2006,2008  . History of transfusion 2015  . Hx of radiation therapy 09/02/11 to 10/19/11   chest  . Hx of radiation therapy   . Hyperlipidemia    takes Pravastatin  . Hypothyroidism    takes Synthroid daily  . Inguinal hernia   . Lung cancer ([place]) 07/2011   right upper lobe, non -small cell lung cancer, did chemo  . MI (myocardial infarction) ([place])  old MI on EKG  . Personal history of adenomatous colonic polyps 07/04/2012   2006, adenomatous right colon polyp removed, then ablated in 2007 followup, 2008, adenomatous polyp of the right colon removed with hot biopsy forceps. All by Dr. Lajoyce Corners 01/03/2013 diminutive polyp ascending colon - adenoma    . Rosacea   . Shortness of breath    with exertion  . Small bowel obstruction ([place]) 07/05/2014  . Status post chemotherapy    last chemo 2013  . Thrombocytopenia ([place])   . Umbilical hernia, incarcerated- surgery 07/10/14 07/05/2014    Past Surgical  History:  Procedure Laterality Date  . ABDOMINAL AORTAGRAM  10/20/2013   Procedure: ABDOMINAL Maxcine Ham;  Surgeon: Elam Dutch, MD;  Location: [facility] CATH LAB;  Service: Cardiovascular;;  . ABDOMINAL AORTIC ANEURYSM REPAIR N/A 10/31/2013   Procedure: aorto to left external iliac and right external iliac and left internal iliac, reimplantation of inferior mesenteric artery and ligation of left iliac artery aneursym;  Surgeon: Elam Dutch, MD;  Location: [place];  Service: Vascular;  Laterality: N/A;  . ABDOMINAL WOUND DEHISCENCE N/A 11/10/2013   Procedure: ABDOMINAL WOUND CLOSURE ;  Surgeon: Serafina Mitchell, MD;  Location: [place];  Service: Vascular;  Laterality: N/A;  . bletharoplasty    . BOWEL RESECTION N/A 07/10/2014   Procedure: SMALL BOWEL RESECTION;  Surgeon: [name], MD;  Location: [place];  Service: General;  Laterality: N/A;  . CARDIAC CATHETERIZATION  2003  . CATARACT EXTRACTION, BILATERAL    . COLONOSCOPY W/ BIOPSIES     multiple   . ESOPHAGOGASTRODUODENOSCOPY  2006  . FINGER SURGERY  2008   left pointer finger  . HERNIA REPAIR  70's   x3 2 Inguinal  . INCISIONAL HERNIA REPAIR N/A 07/10/2014   Procedure: HERNIA REPAIR INCISIONAL;  Surgeon: [name], MD;  Location: [place];  Service: General;  Laterality: N/A;  . KNEE ARTHROSCOPY  2008   left knee  . LAPAROTOMY N/A 07/10/2014   Procedure: EXPLORATORY LAPAROTOMY;  Surgeon: [name], MD;  Location: [place];  Service: General;  Laterality: N/A;  . LOWER EXTREMITY ANGIOGRAM Bilateral 10/20/2013   Procedure: LOWER EXTREMITY ANGIOGRAM;  Surgeon: Elam Dutch, MD;  Location: [facility] CATH LAB;  Service: Cardiovascular;  Laterality: Bilateral;  . LYSIS OF ADHESION N/A 07/10/2014   Procedure: LYSIS OF ADHESION - 90 minutes;  Surgeon: [name], MD;  Location: [place];  Service: General;  Laterality: N/A;  . PORT-A-CATH REMOVAL Left 01/19/2018   Procedure: REMOVAL PORT-A-CATH;  Surgeon: Grace Isaac, MD;  Location: [place];   Service: Thoracic;  Laterality: Left;  . PORTACATH PLACEMENT  08/10/2011   Procedure: INSERTION PORT-A-CATH;  Surgeon: Pierre Bali, MD;  Location: [place];  Service: Thoracic;  Laterality: Left;  Left Subclavian  . PTOSIS REPAIR Bilateral 12-02-2015  . THROMBECTOMY ILIAC ARTERY Bilateral 10/31/2013   Procedure: THROMBECTOMY ILIAC ARTERY;  Surgeon: Elam Dutch, MD;  Location: [place];  Service: Vascular;  Laterality: Bilateral;  . TONSILLECTOMY     as a child  . TOTAL KNEE ARTHROPLASTY Left 08/03/2017   Procedure: LEFT TOTAL KNEE ARTHROPLASTY;  Surgeon: [name], MD;  Location: [place];  Service: Orthopedics;  Laterality: Left;  Marland Kitchen VASECTOMY  70's  . VENTRAL HERNIA REPAIR N/A 04/04/2015   Procedure: OPEN REPAIR OF RECURRENT ABDOMINAL WALL HERNIA WITH MESH, COMPLEX LYSIS OF ADHESIONS X1 HR TAR COMPONENT SEPARATION;  Surgeon: Greer Pickerel, MD;  Location: [facility];  Service: General;  Laterality: N/A;    Current Medications: Current Meds  Medication Sig  . albuterol (PROVENTIL HFA;VENTOLIN HFA) 108 (90 Base) MCG/ACT inhaler Inhale 2 puffs into the lungs every 6 (six) hours as needed for wheezing or shortness of breath.   Marland Kitchen aspirin EC 81 MG tablet Take 81 mg by mouth daily.  . furosemide (LASIX) 20 MG tablet Take 1 tablet (20 mg total) by mouth daily.  . Garlic 716 MG TABS Take 600 mg by mouth 2 (two) times daily.   Marland Kitchen ibuprofen (ADVIL,MOTRIN) 200 MG tablet Take 400 mg by mouth daily as needed (back pain).  . metoprolol tartrate (LOPRESSOR) 25 MG tablet TAKE 1 TABLET (25 MG TOTAL) BY MOUTH 2 (TWO) TIMES DAILY.  . metroNIDAZOLE (METROCREAM) 0.75 % cream Apply 1 application topically 2 (two) times daily as needed (jock itch).   . Multiple Vitamin (MULTIVITAMIN WITH MINERALS) TABS tablet Take 1 tablet by mouth 2 (two) times daily.   . Multiple Vitamins-Minerals (PRESERVISION AREDS 2+MULTI VIT PO) Take 1 capsule by mouth 2 (two) times daily.  Marland Kitchen nystatin cream (MYCOSTATIN) Apply 1 application  topically 2 (two) times daily as needed (jock itch).   Marland Kitchen oxyCODONE (OXY IR/ROXICODONE) 5 MG immediate release tablet Take 1 tablet (5 mg total) by mouth every 8 (eight) hours as needed for moderate pain or severe pain ((score 4 to 6)).  . Probiotic Product (PROBIOTIC DAILY PO) Take 1 capsule by mouth daily. [place] Specialty Vitamins Products (PROSTATE PO) Take 1 tablet by mouth 2 (two) times daily.  Marland Kitchen SYNTHROID 175 MCG tablet Take 175 mcg by mouth daily before breakfast.   . tiotropium (SPIRIVA) 18 MCG inhalation capsule Place 18 mcg into inhaler and inhale daily.   Marland Kitchen triamcinolone cream (KENALOG) 0.1 % Apply 1 application topically 3 times/day as needed-between meals & bedtime (jock itch).   . vitamin B-12 (CYANOCOBALAMIN) 1000 MCG tablet Take 1,000 mcg by mouth daily.     Allergies:   Patient has no known allergies.   Social History   Socioeconomic History  . Marital status: Married    Spouse name: Not on file  . Number of children: 2  . Years of education: Not on file  . Highest education level: Not on file  Occupational History  . Occupation: retired  Scientific laboratory technician  . Financial resource strain: Not on file  . Food insecurity:    Worry: Not on file    Inability: Not on file  . Transportation needs:    Medical: Not on file    Non-medical: Not on file  Tobacco Use  . Smoking status: Former Smoker    Packs/day: 1.50    Years: 40.00    Pack years: 60.00    Types: Cigarettes    Last attempt to quit: 07/02/2011    Years since quitting: 6.7  . Smokeless tobacco: Former Network engineer and Sexual Activity  . Alcohol use: Yes    Comment: social - 2 MONTHLY  . Drug use: No  . Sexual activity: Never  Lifestyle  . Physical activity:    Days per week: Not on file    Minutes per session: Not on file  . Stress: Not on file  Relationships  . Social connections:    Talks on phone: Not on file    Gets together: Not on file    Attends religious service: Not on file     Active member of club or organization: Not on file  Attends meetings of clubs or organizations: Not on file    Relationship status: Not on file  Other Topics Concern  . 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 There is no history of Anesthesia problems, Hypotension, Malignant hyperthermia, Pseudochol deficiency, or Colon cancer. ROS:   Please see the history of present illness.    All other systems reviewed and are negative.  EKGs/Labs/Other Studies Reviewed:    The following studies were reviewed today:  EKG:  EKG is  ordered today.  The ekg ordered today demonstrates Sinus rhythm normal EKG unchanged from prior, this was personally reviewed.  Recent Labs: 01/19/2018: ALT 21; BUN 12; Creatinine, Ser 0.93; Hemoglobin 13.7; Platelets 106; Potassium 3.8; Sodium 142  Recent Lipid Panel    Component Value Date/Time   CHOL 188 10/24/2014 1313   TRIG 195 (H) 10/24/2014 1313   HDL 35 (L) 10/24/2014 1313   LDLCALC 114 (H) 10/24/2014 1313    Physical Exam:    VS:  BP 112/74   Pulse 78   Ht 5' 10.5" (1.791 m)   Wt 214 lb 9.6 oz (97.3 kg)   SpO2 94%   BMI 30.36 kg/m     Wt Readings from Last 3 Encounters:  03/14/18 214 lb 9.6 oz (97.3 kg)  02/07/18 218 lb 2 oz (98.9 kg)  01/19/18 210 lb (95.3 kg)     GEN: Well nourished, well developed in no acute distress HEENT: Normal NECK: No JVD; No carotid bruits LYMPHATICS: No  lymphadenopathy CARDIAC: RRR, no murmurs, rubs, gallops RESPIRATORY:  Clear to auscultation without rales, wheezing or rhonchi  ABDOMEN: Soft, non-tender, non-distended MUSCULOSKELETAL:  No edema; No deformity  SKIN: Warm and dry NEUROLOGIC:  Alert and oriented x 3 PSYCHIATRIC:  Normal affect   ASSESSMENT:    1. Hyperlipidemia, unspecified hyperlipidemia type   2. Statin intolerance   3. Cirrhosis of liver without ascites, unspecified hepatic cirrhosis type ([place])   4. Coronary artery disease due to lipid rich plaque   5. PAD (peripheral artery disease) ([facility])    PLAN:    In order of problems listed above:  1. Hyperlipidemia - with known severe PAD and non-obstructive CAD, he is intolerant to statins sec to LFTs elevation and h/o cirrhosis. We will refer him to the lipid clinic for PSCK 9 inhibitors.  2. He has had no symptoms of chest pain and has stable dyspnea on moderate exertion, will continue aspirin, metoprolol. 3. No recent palpitations, in sinus rhythm today. On aspirin only if prior bleeding.  Follow up in 6 months.  Medication Adjustments/Labs and Tests Ordered: Current medicines are reviewed at length with the patient today.  Concerns regarding medicines are outlined above.  Orders Placed This Encounter  Procedures  . AMB Referral to [facility]  . EKG 12-Lead   No orders of the defined types were placed in this encounter.   Signed, Ena Dawley, MD  03/16/2018 5:18 PM  [facility]

## 2018-03-30 ENCOUNTER — Encounter

## 2018-03-31 ENCOUNTER — Ambulatory Visit: Payer: PPO

## 2018-04-11 ENCOUNTER — Ambulatory Visit (INDEPENDENT_AMBULATORY_CARE_PROVIDER_SITE_OTHER): Payer: PPO | Admitting: Pharmacist

## 2018-04-11 DIAGNOSIS — E785 Hyperlipidemia, unspecified: Secondary | ICD-10-CM

## 2018-04-11 DIAGNOSIS — I2510 Atherosclerotic heart disease of native coronary artery without angina pectoris: Secondary | ICD-10-CM

## 2018-04-11 DIAGNOSIS — I2583 Coronary atherosclerosis due to lipid rich plaque: Secondary | ICD-10-CM

## 2018-04-11 NOTE — Patient Instructions (Addendum)
It was great to see you today!  The medications we discussed are called Praluent and Repatha. The medication is an injection that you would keep in the refrigerator. We will check with your insurance about the co-pay for the medication. We are sending you home with the patient assistance forms for both medications. After we find out your co-pay we can discuss which form to fill out if you are interested in starting the medication.  If you have any questions please call the clinic at [phone].

## 2018-04-11 NOTE — Progress Notes (Signed)
Patient ID: [name]                 DOB: 06-28-1941                    MRN: [id number]     HPI: Joseph [name] is a 77 y.o. male patient of Dr. Meda [name] that presents today for lipid evaluation.  PMH includes HLD, nonobstructive CAD, PAD, lung cancer, CHF, A fib, AAA. Previously has had elevated LFTs due to initiation of chemotherapy for lung cancer. Now LFTs have normalized. Last time a statin was prescribed was in 2016.  Patient presents for lipid management. He has been referred to lipid clinic for consideration of PCSK9i. Atorvastatin caused weakness in his legs. When he stopped the medication the weakness in his leg did improve however he still does have some weakness. Patient states he has fatty liver. He has never tried ezetimibe before. Patient is wary about giving himself shots at home. The co-pay for the medication may also be high for patient.   Risk Factors: HLD, CAD, CHF, A fib, AAA, PAD LDL Goal: <70 mg/dL, LDL-p <1000, TG <150  Current Medications: none Intolerances: atorvastatin- weakness, Livalo- elevated bili, pravastatin- muscle aches & fatigue, simvastatin  Diet: mostly eats at home. Eats lamb/steak, spaghetti, hot dogs, hamburgers. Mostly eats pork, does not eat a lot of fish. Does not like eating vegetables. Sometimes has toast, pancakes, but not a lot of bread. Drinks 2 cups of [name] with cream and Splenda, iced tea, and water.  Exercise: does not do much exercise since having his knee surgery. Does house and yard work sometimes.  Family History: Brother- AAA, HLD, HTN; father- DM; paternal grandfather- heart attack; Mother- HLD, HTN; Sister- HLD, HTN  Social History: Former smoker- 1.5 ppd, 60 pack year history, quit in 2012. 2 drinks monthly  Labs:  11/18/2017: TC 176, TG 124, HDL 39, LDL 112 (no therapy) 10/24/2014: TC: 188; HDL-C: 35; HDL particle number: 19.8; LDL: 114; LDL particle number: 1,430; Small LDL Particle Number 206; LP-IR score: 45, TG:  195.   Past Medical History:  Diagnosis Date  . AAA (abdominal aortic aneurysm) ([place])   . Acute on chronic diastolic CHF (congestive heart failure), NYHA class 3 ([place]) 07/23/2014  . Arthritis    left knee and back arthrtis  . Atrial fibrillation with rapid ventricular response ([place]) 07/04/2014  . CAD (coronary artery disease)   . Cataract   . Cirrhosis ([place])    By radiography, seen on CT  . COPD (chronic obstructive pulmonary disease) ([place])   . Diverticulosis   . Emphysema   . Enlarged prostate   . Head injury    teenager  . Headache    when younger but not any longer  . Heart murmur    teen  . History of abdominal aortic aneurysm (AAA)   . History of colon polyps 2006,2008  . History of transfusion 2015  . Hx of radiation therapy 09/02/11 to 10/19/11   chest  . Hx of radiation therapy   . Hyperlipidemia    takes Pravastatin  . Hypothyroidism    takes Synthroid daily  . Inguinal hernia   . Lung cancer ([place]) 07/2011   right upper lobe, non -small cell lung cancer, did chemo  . MI (myocardial infarction) [facility])    old MI on EKG  . Personal history of adenomatous colonic polyps 07/04/2012   2006, adenomatous right colon polyp removed, then ablated in 2007 followup,  2008, adenomatous polyp of the right colon removed with hot biopsy forceps. All by Dr. Lajoyce Corners 01/03/2013 diminutive polyp ascending colon - adenoma    . Rosacea   . Shortness of breath    with exertion  . Small bowel obstruction ([place]) 07/05/2014  . Status post chemotherapy    last chemo 2013  . Thrombocytopenia ([place])   . Umbilical hernia, incarcerated- surgery 07/10/14 07/05/2014    Current Outpatient Medications on File Prior to Visit  Medication Sig Dispense Refill  . albuterol (PROVENTIL HFA;VENTOLIN HFA) 108 (90 Base) MCG/ACT inhaler Inhale 2 puffs into the lungs every 6 (six) hours as needed for wheezing or shortness of breath.     Marland Kitchen aspirin EC 81 MG tablet Take 81 mg by mouth daily.    . furosemide (LASIX) 20 MG  tablet Take 1 tablet (20 mg total) by mouth daily. 90 tablet 3  . Garlic 468 MG TABS Take 600 mg by mouth 2 (two) times daily.     Marland Kitchen ibuprofen (ADVIL,MOTRIN) 200 MG tablet Take 400 mg by mouth daily as needed (back pain).    . metoprolol tartrate (LOPRESSOR) 25 MG tablet TAKE 1 TABLET (25 MG TOTAL) BY MOUTH 2 (TWO) TIMES DAILY. 180 tablet 3  . metroNIDAZOLE (METROCREAM) 0.75 % cream Apply 1 application topically 2 (two) times daily as needed (jock itch).     . Multiple Vitamin (MULTIVITAMIN WITH MINERALS) TABS tablet Take 1 tablet by mouth 2 (two) times daily.     . Multiple Vitamins-Minerals (PRESERVISION AREDS 2+MULTI VIT PO) Take 1 capsule by mouth 2 (two) times daily.    Marland Kitchen nystatin cream (MYCOSTATIN) Apply 1 application topically 2 (two) times daily as needed (jock itch).     . Probiotic Product (PROBIOTIC DAILY PO) Take 1 capsule by mouth daily. [place] Specialty Vitamins Products (PROSTATE PO) Take 1 tablet by mouth 2 (two) times daily.    Marland Kitchen SYNTHROID 175 MCG tablet Take 175 mcg by mouth daily before breakfast.     . tiotropium (SPIRIVA) 18 MCG inhalation capsule Place 18 mcg into inhaler and inhale daily.     Marland Kitchen triamcinolone cream (KENALOG) 0.1 % Apply 1 application topically 3 times/day as needed-between meals & bedtime (jock itch).     . vitamin B-12 (CYANOCOBALAMIN) 1000 MCG tablet Take 1,000 mcg by mouth daily.     No current facility-administered medications on file prior to visit.     No Known Allergies  Assessment/Plan: Hyperlipidemia: LDL above goal (LDL<70). Discussed both Repatha and Praluent with patient. Provided medication education about both medications, including injection technique, side effects, and cost obligations. Patient wants to know co-pay of medication before he starts the medication. Discussed filling out patient assistance with patient as well. Discussed cholesterol goals with patient. Will find out the co-pay for medication and contact patient. Sent  patient home with patient assistance forms for both Praluent and Repatha.   Thank you,  Lelan Pons. Patterson Hammersmith, [facility]  04/11/2018 4:09 PM  Patient seen with Danella Penton, PharmD Candidate and Claiborne Billings PharmD, PGY2 Cardiology Resident.

## 2018-04-14 ENCOUNTER — Telehealth: Payer: Self-pay | Admitting: Pharmacist

## 2018-04-14 MED ORDER — EVOLOCUMAB 140 MG/ML ~~LOC~~ SOAJ: 1.0000 | SUBCUTANEOUS | 11 refills | Status: DC

## 2018-04-14 NOTE — Telephone Encounter (Signed)
Co-pay at [facility] is $147 for one month. Patient will fill out Repatha patient assistance forms and bring to clinic front desk to drop off.

## 2018-04-14 NOTE — Telephone Encounter (Signed)
Spoke with patient about Repatha. Patient's insurance approved the PA for [place]. Will send prescription to specialty pharmacy in [place] to find out what the co-pay is as patient wants to know co-pay before receiving the medication.

## 2018-04-19 ENCOUNTER — Encounter: Payer: PPO | Admitting: Internal Medicine

## 2018-05-24 ENCOUNTER — Encounter: Payer: Self-pay | Admitting: Internal Medicine

## 2018-05-24 DIAGNOSIS — I10 Essential (primary) hypertension: Secondary | ICD-10-CM | POA: Diagnosis not present

## 2018-05-24 DIAGNOSIS — R1011 Right upper quadrant pain: Secondary | ICD-10-CM | POA: Diagnosis not present

## 2018-05-24 DIAGNOSIS — Z85118 Personal history of other malignant neoplasm of bronchus and lung: Secondary | ICD-10-CM | POA: Diagnosis not present

## 2018-05-24 DIAGNOSIS — E7849 Other hyperlipidemia: Secondary | ICD-10-CM | POA: Diagnosis not present

## 2018-05-24 DIAGNOSIS — Z6830 Body mass index (BMI) 30.0-30.9, adult: Secondary | ICD-10-CM | POA: Diagnosis not present

## 2018-05-24 DIAGNOSIS — J449 Chronic obstructive pulmonary disease, unspecified: Secondary | ICD-10-CM | POA: Diagnosis not present

## 2018-05-24 DIAGNOSIS — K7460 Unspecified cirrhosis of liver: Secondary | ICD-10-CM | POA: Diagnosis not present

## 2018-05-24 DIAGNOSIS — D696 Thrombocytopenia, unspecified: Secondary | ICD-10-CM | POA: Diagnosis not present

## 2018-05-26 ENCOUNTER — Other Ambulatory Visit: Payer: Self-pay | Admitting: Internal Medicine

## 2018-05-26 DIAGNOSIS — K7469 Other cirrhosis of liver: Secondary | ICD-10-CM

## 2018-05-26 DIAGNOSIS — R1011 Right upper quadrant pain: Secondary | ICD-10-CM

## 2018-05-30 ENCOUNTER — Ambulatory Visit
Admission: RE | Admit: 2018-05-30 | Discharge: 2018-05-30 | Disposition: A | Discharge status: Home / Self Care | Payer: PPO | Source: Ambulatory Visit | Attending: Internal Medicine | Admitting: Internal Medicine

## 2018-05-30 DIAGNOSIS — K7460 Unspecified cirrhosis of liver: Secondary | ICD-10-CM | POA: Diagnosis not present

## 2018-05-30 DIAGNOSIS — R1011 Right upper quadrant pain: Secondary | ICD-10-CM

## 2018-05-30 DIAGNOSIS — K7469 Other cirrhosis of liver: Secondary | ICD-10-CM

## 2018-06-02 ENCOUNTER — Encounter: Payer: PPO | Admitting: Internal Medicine

## 2018-06-08 DIAGNOSIS — R972 Elevated prostate specific antigen [PSA]: Secondary | ICD-10-CM | POA: Diagnosis not present

## 2018-06-08 DIAGNOSIS — N5201 Erectile dysfunction due to arterial insufficiency: Secondary | ICD-10-CM | POA: Diagnosis not present

## 2018-06-08 DIAGNOSIS — R3912 Poor urinary stream: Secondary | ICD-10-CM | POA: Diagnosis not present

## 2018-06-08 DIAGNOSIS — N401 Enlarged prostate with lower urinary tract symptoms: Secondary | ICD-10-CM | POA: Diagnosis not present

## 2018-06-28 ENCOUNTER — Encounter: Payer: Self-pay | Admitting: Internal Medicine

## 2018-06-28 ENCOUNTER — Ambulatory Visit (AMBULATORY_SURGERY_CENTER): Payer: Self-pay | Admitting: *Deleted

## 2018-06-28 VITALS — Ht 72.0 in | Wt 216.0 lb

## 2018-06-28 DIAGNOSIS — K7460 Unspecified cirrhosis of liver: Secondary | ICD-10-CM

## 2018-06-28 DIAGNOSIS — K5900 Constipation, unspecified: Secondary | ICD-10-CM | POA: Diagnosis not present

## 2018-06-28 DIAGNOSIS — R194 Change in bowel habit: Secondary | ICD-10-CM | POA: Diagnosis not present

## 2018-06-28 DIAGNOSIS — Z86010 Personal history of colonic polyps: Secondary | ICD-10-CM

## 2018-06-28 DIAGNOSIS — Z6829 Body mass index (BMI) 29.0-29.9, adult: Secondary | ICD-10-CM | POA: Diagnosis not present

## 2018-06-28 DIAGNOSIS — I10 Essential (primary) hypertension: Secondary | ICD-10-CM | POA: Diagnosis not present

## 2018-06-28 NOTE — Progress Notes (Signed)
On arrival patient stating he felt that he had a problem. Patient relays a history of intestinal obstruction. Patient stating 3-5 years ago he was admitted with this problem and had surgery. Since that time he has had 2 more admissions. Patient stating in April he was able to stay home with similar symptoms and found relief. Patient stating since 10/24 he has had bloating, little BM, no fever and slight abd distention. Patient plans to go to Dr Raul Del office today and "they will do an xray". Patient requesting to reschedule his previsit. previst rescheduled for Tuesday, 07/05/18 at 2 pm. Patient plans to inform Dr Celesta Aver office if any thing is found on xray.Colon/endo is scheduled for 07/12/18. Note sent to Dr Carlean Purl as Juluis Rainier

## 2018-07-04 ENCOUNTER — Telehealth: Payer: Self-pay | Admitting: Student-PharmD

## 2018-07-04 NOTE — Telephone Encounter (Signed)
Called patient to inform him that the prior authorization for Repatha was approved by his insurance and that a prescription had been sent to [facility] in [place] back in August. Patient stated that he is not wanting to start therapy just yet as he is moving to [place] soon.

## 2018-07-04 NOTE — Telephone Encounter (Signed)
Agree with note as documented.

## 2018-07-05 ENCOUNTER — Encounter: Payer: Self-pay | Admitting: Internal Medicine

## 2018-07-05 ENCOUNTER — Other Ambulatory Visit: Payer: Self-pay

## 2018-07-05 ENCOUNTER — Ambulatory Visit (AMBULATORY_SURGERY_CENTER): Payer: Self-pay | Admitting: *Deleted

## 2018-07-05 VITALS — Ht 72.0 in | Wt 215.6 lb

## 2018-07-05 DIAGNOSIS — Z86010 Personal history of colonic polyps: Secondary | ICD-10-CM

## 2018-07-05 DIAGNOSIS — K7469 Other cirrhosis of liver: Secondary | ICD-10-CM

## 2018-07-05 NOTE — Progress Notes (Signed)
Patient denies any allergies to egg or soy products. Patient denies complications with anesthesia/sedation.  Patient denies oxygen use at home and denies diet medications. Patient denies information on colonoscopy. 

## 2018-07-08 ENCOUNTER — Ambulatory Visit (INDEPENDENT_AMBULATORY_CARE_PROVIDER_SITE_OTHER): Payer: PPO

## 2018-07-08 ENCOUNTER — Ambulatory Visit (INDEPENDENT_AMBULATORY_CARE_PROVIDER_SITE_OTHER): Payer: PPO | Admitting: Orthopaedic Surgery

## 2018-07-08 ENCOUNTER — Encounter (INDEPENDENT_AMBULATORY_CARE_PROVIDER_SITE_OTHER): Payer: Self-pay | Admitting: Orthopaedic Surgery

## 2018-07-08 VITALS — BP 117/68 | HR 80 | Resp 16 | Ht 72.0 in | Wt 215.0 lb

## 2018-07-08 DIAGNOSIS — G8929 Other chronic pain: Secondary | ICD-10-CM

## 2018-07-08 DIAGNOSIS — M25562 Pain in left knee: Secondary | ICD-10-CM

## 2018-07-08 NOTE — Progress Notes (Signed)
Office Visit Note   Patient: Joseph [name]           Date of Birth: 08/23/1941           MRN: [id number] Visit Date: 07/08/2018              Requested by: [name], MD [address] [place], [place] [address] PCP: [name], MD   Assessment & Plan: Visit Diagnoses:  1. Chronic pain of left knee     Plan: 11 months status post primary left total knee replacement doing well.  Occasional ache or pain.  Not exercising.  Had some recent onset of pain after some heavy lifting along the medial aspect of his knee that does not appear to be significant by exam.  Would like to see him again in 6 months.  Urged him to work with exercises to strengthen quads and hamstrings  Follow-Up Instructions: Return in about 6 months (around 01/06/2019).   Orders:  Orders Placed This Encounter  Procedures  . XR KNEE 3 VIEW LEFT   No orders of the defined types were placed in this encounter.     Procedures: No procedures performed   Clinical Data: No additional findings.   Subjective: Chief Complaint  Patient presents with  . Left Knee - Pain  . Knee Pain    Left knee pain x 2 months, difficulty sleeping, no injury, moving table, not diabtic  Status post primary left total knee replacement and December 2018.  Several months ago was helping his wife lift a heavy object and developed some pain on the medial aspect of his knee.  Still having a little bit of trouble.  No swelling.  No fever or chills.  Recently had the Port-A-Cath removed from his chest and is 6 years status post treatment for lung cancer without evidence of residual.  HPI  Review of Systems  Constitutional: Negative for fatigue.  HENT: Negative for trouble swallowing.   Eyes: Negative for pain.  Respiratory: Positive for shortness of breath.   Cardiovascular: Negative for leg swelling.  Gastrointestinal: Negative for constipation.  Endocrine: Negative for cold intolerance.  Genitourinary: Negative for  difficulty urinating.  Musculoskeletal: Positive for joint swelling.  Skin: Negative for rash.  Allergic/Immunologic: Negative for food allergies.  Neurological: Negative for weakness.  Hematological: Does not bruise/bleed easily.  Psychiatric/Behavioral: Positive for sleep disturbance.     Objective: Vital Signs: BP 117/68 (BP Location: Left Arm, Patient Position: Sitting, Cuff Size: Normal)   Pulse 80   Resp 16   Ht 6' (1.829 m)   Wt 215 lb (97.5 kg)   BMI 29.16 kg/m   Physical Exam  Constitutional: He is oriented to person, place, and time. He appears well-developed and well-nourished.  HENT:  Mouth/Throat: Oropharynx is clear and moist.  Eyes: Pupils are equal, round, and reactive to light. EOM are normal.  Pulmonary/Chest: Effort normal.  Neurological: He is alert and oriented to person, place, and time.  Skin: Skin is warm and dry.  Psychiatric: He has a normal mood and affect. His behavior is normal.    Ortho Exam awake alert and oriented x3.  Comfortable sitting.  Does not use any ambulatory aid.  No limp.  Left knee was not hot, red or swollen.  No effusion.  No instability with varus valgus stress.  Negative anterior drawer sign.  Little bit of tenderness along the anterior medial tibia but without any skin change.  No distal edema.  Motor exam intact.  Range of motion from 0 to almost 120 degrees  Specialty Comments:  No specialty comments available.  Imaging: Xr Knee 3 View Left  Result Date: 07/08/2018 Films of the left total knee replacement obtained in 3 projections.  No evidence of any complications.  No ectopic calcification.  No evidence of loosening.    PMFS History: Patient Active Problem List   Diagnosis Date Noted  . Primary osteoarthritis of left knee 08/03/2017  . S/P total knee replacement using cement, left 08/03/2017  . SOB (shortness of breath)   . Chronic pain of left knee 07/06/2016  . Claudication ([place]) 02/18/2016  . Statin intolerance  02/18/2016  . Essential hypertension 02/18/2016  . Coronary artery disease due to lipid rich plaque 08/29/2015  . Acute on chronic diastolic CHF (congestive heart failure), NYHA class 2 ([place]) 08/29/2015  . 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 Atrial fibrillation ([place]) 07/23/2014  . COPD (chronic obstructive pulmonary disease) ([place]) 07/06/2014  . Hypothyroidism 07/06/2014  . Numbness-Left Thigh 12/14/2013  . AAA- s/p Ao-Iliac BPG March 2015 10/20/2013  . Hyperlipidemia 10/09/2013  . Hepatic cirrhosis ([place]) 12/02/2012  . Hx of adenomatous colonic polyps 07/04/2012  . Cancer of right lung ([place]) 08/06/2011   Past Medical History:  Diagnosis Date  . AAA (abdominal aortic aneurysm) ([place])   . Acute on chronic diastolic CHF (congestive heart failure), NYHA class 3 ([place]) 07/23/2014  . Anemia   . Arthritis    left knee and back arthrtis  . Atrial fibrillation with rapid ventricular response ([place]) 07/04/2014  . CAD (coronary artery disease)   . Cataract    surgery to remove  . Cirrhosis ([place])    By radiography, seen on CT  . COPD (chronic obstructive pulmonary disease) ([place])   . Diverticulosis   . Emphysema   . Emphysema of lung ([place])   . Enlarged prostate   . GERD (gastroesophageal reflux disease)    diet  controlled, no meds  . Head injury    teenager  . Headache    when younger but no longer an issue  . Heart murmur    teen  . History of abdominal aortic aneurysm (AAA)   . History of blood transfusion    with AAA surgery  . History of colon polyps 2006,2008  . History of transfusion 2015  . Hx of radiation therapy 09/02/11 to 10/19/11   chest  . Hx of radiation therapy   . Hyperlipidemia    no meds, diet controlled  . Hypothyroidism    takes Synthroid daily  . Inguinal hernia   . Lung cancer ([place]) 07/2011   right upper lobe, non -small cell lung cancer, did chemo  . MI (myocardial infarction) [facility])    old MI on EKG  .  Personal history of adenomatous colonic polyps 07/04/2012   2006, adenomatous right colon polyp removed, then ablated in 2007 followup, 2008, adenomatous polyp of the right colon removed with hot biopsy forceps. All by Dr. Lajoyce Corners 01/03/2013 diminutive polyp ascending colon - adenoma    . Rosacea   . Shortness of breath    with exertion  . Small bowel obstruction ([place]) 07/05/2014  . Status post chemotherapy    last chemo 2013  . Thrombocytopenia ([place])   . Umbilical hernia, incarcerated- surgery 07/10/14 07/05/2014    Family History  Problem Relation Age of Onset  . Cancer Father   . Diabetes Father   . Hyperlipidemia Mother   .  Hypertension Mother   . Alzheimer's disease Mother   . Hyperlipidemia Brother   . AAA (abdominal aortic aneurysm) Brother   . Hypertension Brother   . Other Brother        Pre-diabetic  . Hyperlipidemia Sister   . Hypertension Sister   . Macular degeneration Sister   . Heart attack Paternal Grandfather   . Anesthesia problems Neg Hx   . Hypotension Neg Hx   . Malignant hyperthermia Neg Hx   . Pseudochol deficiency Neg Hx   . Colon cancer Neg Hx   . Rectal cancer Neg Hx   . Stomach cancer Neg Hx     Past Surgical History:  Procedure Laterality Date  . ABDOMINAL AORTAGRAM  10/20/2013   Procedure: ABDOMINAL Maxcine Ham;  Surgeon: Elam Dutch, MD;  Location: [facility] CATH LAB;  Service: Cardiovascular;;  . ABDOMINAL AORTIC ANEURYSM REPAIR N/A 10/31/2013   Procedure: aorto to left external iliac and right external iliac and left internal iliac, reimplantation of inferior mesenteric artery and ligation of left iliac artery aneursym;  Surgeon: Elam Dutch, MD;  Location: [facility];  Service: Vascular;  Laterality: N/A;  . ABDOMINAL WOUND DEHISCENCE N/A 11/10/2013   Procedure: ABDOMINAL WOUND CLOSURE ;  Surgeon: Serafina Mitchell, MD;  Location: [facility];  Service: Vascular;  Laterality: N/A;  . bletharoplasty    . BOWEL RESECTION N/A 07/10/2014   Procedure: SMALL BOWEL  RESECTION;  Surgeon: [name], MD;  Location: [facility];  Service: General;  Laterality: N/A;  . CARDIAC CATHETERIZATION  2003  . CATARACT EXTRACTION, BILATERAL Bilateral    x 2  . COLONOSCOPY W/ BIOPSIES     multiple   . ESOPHAGOGASTRODUODENOSCOPY  2006  . FINGER SURGERY  2008   left pointer finger  . HERNIA REPAIR  70's   x3 2 Inguinal  . INCISIONAL HERNIA REPAIR N/A 07/10/2014   Procedure: HERNIA REPAIR INCISIONAL;  Surgeon: [name], MD;  Location: [facility];  Service: General;  Laterality: N/A;  . JOINT REPLACEMENT    . KNEE ARTHROSCOPY  2008   left knee  . LAPAROTOMY N/A 07/10/2014   Procedure: EXPLORATORY LAPAROTOMY;  Surgeon: [name], MD;  Location: [facility];  Service: General;  Laterality: N/A;  . LOWER EXTREMITY ANGIOGRAM Bilateral 10/20/2013   Procedure: LOWER EXTREMITY ANGIOGRAM;  Surgeon: Elam Dutch, MD;  Location: [facility] CATH LAB;  Service: Cardiovascular;  Laterality: Bilateral;  . LYSIS OF ADHESION N/A 07/10/2014   Procedure: LYSIS OF ADHESION - 90 minutes;  Surgeon: [name], MD;  Location: [facility];  Service: General;  Laterality: N/A;  . PORT-A-CATH REMOVAL Left 01/19/2018   Procedure: REMOVAL PORT-A-CATH;  Surgeon: Grace Isaac, MD;  Location: [facility];  Service: Thoracic;  Laterality: Left;  . PORTACATH PLACEMENT  08/10/2011   Procedure: INSERTION PORT-A-CATH;  Surgeon: Pierre Bali, MD;  Location: [facility];  Service: Thoracic;  Laterality: Left;  Left Subclavian  . PTOSIS REPAIR Bilateral 12-02-2015  . THROMBECTOMY ILIAC ARTERY Bilateral 10/31/2013   Procedure: THROMBECTOMY ILIAC ARTERY;  Surgeon: Elam Dutch, MD;  Location: [facility];  Service: Vascular;  Laterality: Bilateral;  . TONSILLECTOMY     as a child  . TOTAL KNEE ARTHROPLASTY Left 08/03/2017   Procedure: LEFT TOTAL KNEE ARTHROPLASTY;  Surgeon: [name], MD;  Location: [facility];  Service: Orthopedics;  Laterality: Left;  Marland Kitchen VASECTOMY  70's  . VENTRAL HERNIA REPAIR N/A 04/04/2015    Procedure: OPEN REPAIR OF  RECURRENT ABDOMINAL WALL HERNIA WITH MESH, COMPLEX LYSIS OF ADHESIONS X1 HR TAR COMPONENT SEPARATION;  Surgeon: Greer Pickerel, MD;  Location: [facility];  Service: General;  Laterality: N/A;   Social History   Occupational History  . Occupation: retired  Tobacco Use  . Smoking status: Former Smoker    Packs/day: 1.50    Years: 40.00    Pack years: 60.00    Types: Cigarettes    Last attempt to quit: 07/02/2011    Years since quitting: 7.0  . Smokeless tobacco: Former Systems developer    Types: Snuff  Substance and Sexual Activity  . Alcohol use: Yes    Comment: social - 2 MONTHLY  . Drug use: No  . Sexual activity: Not Currently

## 2018-07-12 ENCOUNTER — Ambulatory Visit (AMBULATORY_SURGERY_CENTER): Payer: PPO | Admitting: Internal Medicine

## 2018-07-12 ENCOUNTER — Encounter: Payer: Self-pay | Admitting: Internal Medicine

## 2018-07-12 VITALS — BP 111/60 | HR 91 | Temp 96.8°F | Resp 15 | Ht 72.0 in | Wt 215.0 lb

## 2018-07-12 DIAGNOSIS — K2960 Other gastritis without bleeding: Secondary | ICD-10-CM | POA: Diagnosis not present

## 2018-07-12 DIAGNOSIS — K7469 Other cirrhosis of liver: Secondary | ICD-10-CM | POA: Diagnosis not present

## 2018-07-12 DIAGNOSIS — K7460 Unspecified cirrhosis of liver: Secondary | ICD-10-CM | POA: Diagnosis not present

## 2018-07-12 DIAGNOSIS — Z86010 Personal history of colonic polyps: Secondary | ICD-10-CM | POA: Diagnosis not present

## 2018-07-12 DIAGNOSIS — K2950 Unspecified chronic gastritis without bleeding: Secondary | ICD-10-CM | POA: Diagnosis not present

## 2018-07-12 MED ORDER — SODIUM CHLORIDE 0.9 % IV SOLN: 500.0000 mL | Freq: Once | INTRAVENOUS | Status: DC

## 2018-07-12 MED ORDER — PANTOPRAZOLE SODIUM 20 MG PO TBEC: 20.0000 mg | DELAYED_RELEASE_TABLET | Freq: Every day | ORAL | 3 refills | Status: DC

## 2018-07-12 NOTE — Op Note (Signed)
[name] Procedure Date: 07/12/2018 11:02 AM MRN: [id number] Endoscopist: Gatha Mayer , MD Age: 77 Referring MD:  Date of Birth: 04-22-1941 Gender: Male Account #: [id number] Procedure:                Colonoscopy Indications:              Surveillance: Personal history of adenomatous                            polyps on last colonoscopy 5 years ago Medicines:                Propofol per Anesthesia, Monitored Anesthesia Care Procedure:                Pre-Anesthesia Assessment:                           - Prior to the procedure, a History and Physical                            was performed, and patient medications and                            allergies were reviewed. The patient's tolerance of                            previous anesthesia was also reviewed. The risks                            and benefits of the procedure and the sedation                            options and risks were discussed with the patient.                            All questions were answered, and informed consent                            was obtained. Prior Anticoagulants: The patient has                            taken no previous anticoagulant or antiplatelet                            agents. ASA Grade Assessment: III - A patient with                            severe systemic disease. After reviewing the risks                            and benefits, the patient was deemed in                            satisfactory condition to undergo the procedure.  After obtaining informed consent, the colonoscope                            was passed under direct vision. Throughout the                            procedure, the patient's blood pressure, pulse, and                            oxygen saturations were monitored continuously. The                            Model CF-HQ190L [id number]) scope was introduced          through the anus and advanced to the the cecum,                            identified by appendiceal orifice and ileocecal                            valve. The colonoscopy was performed without                            difficulty. The patient tolerated the procedure                            well. The quality of the bowel preparation was                            excellent. The ileocecal valve, appendiceal                            orifice, and rectum were photographed. The bowel                            preparation used was Miralax. Scope In: 11:22:48 AM Scope Out: 11:34:53 AM Scope Withdrawal Time: 0 hours 10 minutes 1 second  Total Procedure Duration: 0 hours 12 minutes 5 seconds  Findings:                 The perianal and digital rectal examinations were                            normal. Pertinent negatives include normal prostate                            (size, shape, and consistency).                           Many small and large-mouthed diverticula were found                            in the entire colon.                           Internal hemorrhoids were found during  retroflexion.                           The exam was otherwise without abnormality on                            direct and retroflexion views. Complications:            No immediate complications. Estimated Blood Loss:     Estimated blood loss: none. Impression:               - Diverticulosis in the entire examined colon.                           - Internal hemorrhoids.                           - The examination was otherwise normal on direct                            and retroflexion views.                           - No specimens collected.                           - Personal history of colonic polyps - adenomas Recommendation:           - Patient has a contact number available for                            emergencies. The signs and symptoms of potential                             delayed complications were discussed with the                            patient. Return to normal activities tomorrow.                            Written discharge instructions were provided to the                            patient.                           - Continue present medications.                           - No repeat colonoscopy due to age and the absence                            of colonic polyps today Gatha Mayer, MD 07/12/2018 11:50:10 AM This report has been signed electronically.

## 2018-07-12 NOTE — Op Note (Signed)
[facility] Patient Name: Joseph [name] Procedure Date: 07/12/2018 11:03 AM MRN: [id number] Endoscopist: Gatha Mayer , MD Age: 77 Referring MD:  Date of Birth: 07/26/1941 Gender: Male Account #: [id number] Procedure:                Upper GI endoscopy Indications:              Cirrhosis rule out esophageal varices Medicines:                Propofol per Anesthesia, Monitored Anesthesia Care Procedure:                Pre-Anesthesia Assessment:                           - Prior to the procedure, a History and Physical                            was performed, and patient medications and                            allergies were reviewed. The patient's tolerance of                            previous anesthesia was also reviewed. The risks                            and benefits of the procedure and the sedation                            options and risks were discussed with the patient.                            All questions were answered, and informed consent                            was obtained. Prior Anticoagulants: The patient has                            taken no previous anticoagulant or antiplatelet                            agents. ASA Grade Assessment: III - A patient with                            severe systemic disease. After reviewing the risks                            and benefits, the patient was deemed in                            satisfactory condition to undergo the procedure.                           After obtaining informed consent, the endoscope was  passed under direct vision. Throughout the                            procedure, the patient's blood pressure, pulse, and                            oxygen saturations were monitored continuously. The                            Endoscope was introduced through the mouth, and                            advanced to the second part of duodenum. The upper                 GI endoscopy was accomplished without difficulty.                            The patient tolerated the procedure well. Scope In: Scope Out: Findings:                 Multiple dispersed, 1 to 4 mm non-bleeding erosions                            were found in the gastric antrum. There were                            stigmata of recent bleeding. Biopsies were taken                            with a cold forceps for histology. Verification of                            patient identification for the specimen was done.                            Estimated blood loss was minimal.                           The examined esophagus was normal.                           The examined duodenum was normal.                           The exam was otherwise without abnormality.                           The cardia and gastric fundus were normal on                            retroflexion. Complications:            No immediate complications. Estimated Blood Loss:     Estimated blood loss was minimal. Impression:               - Non-bleeding erosive gastropathy. Biopsied.                           -  Normal esophagus.                           - Normal examined duodenum.                           - The examination was otherwise normal. Recommendation:           - Patient has a contact number available for                            emergencies. The signs and symptoms of potential                            delayed complications were discussed with the                            patient. Return to normal activities tomorrow.                            Written discharge instructions were provided to the                            patient.                           - Resume previous diet.                           - Continue present medications.                           - Await pathology results.                           - Use Protonix (pantoprazole) 20 mg PO daily.                            - If H pylori is present will treat.                           Ibuprofen on med list so that may be causative                            agent for gastritis. Gatha Mayer, MD 07/12/2018 11:46:19 AM This report has been signed electronically.

## 2018-07-12 NOTE — Progress Notes (Signed)
PT taken to PACU. Monitors in place. VSS. Report given to RN. 

## 2018-07-12 NOTE — Patient Instructions (Addendum)
You have multiple erosions in the stomach - likely from ibuprofen if you are taking that with regularity.  I have prescribed pantoprazole to heal these and also took biopsies to see if there is another cause like an infection.  Ibuprofen is not a great medication for the liver, either.  Will discuss alternatives after I review things.  No colon polyps - still have diverticulosis. No more routine colonoscopy needed.  I appreciate the opportunity to care for you. Gatha Mayer, MD, FACG YOU HAD AN ENDOSCOPIC PROCEDURE TODAY AT [facility]:   Refer to the procedure report that was given to you for any specific questions about what was found during the examination.  If the procedure report does not answer your questions, please call your gastroenterologist to clarify.  If you requested that your care partner not be given the details of your procedure findings, then the procedure report has been included in a sealed envelope for you to review at your convenience later.  YOU SHOULD EXPECT: Some feelings of bloating in the abdomen. Passage of more gas than usual.  Walking can help get rid of the air that was put into your GI tract during the procedure and reduce the bloating. If you had a lower endoscopy (such as a colonoscopy or flexible sigmoidoscopy) you may notice spotting of blood in your stool or on the toilet paper. If you underwent a bowel prep for your procedure, you may not have a normal bowel movement for a few days.  Please Note:  You might notice some irritation and congestion in your nose or some drainage.  This is from the oxygen used during your procedure.  There is no need for concern and it should clear up in a day or so.  SYMPTOMS TO REPORT IMMEDIATELY:   Following lower endoscopy (colonoscopy or flexible sigmoidoscopy):  Excessive amounts of blood in the stool  Significant tenderness or worsening of abdominal pains  Swelling of the abdomen that is  new, acute  Fever of 100F or higher   Following upper endoscopy (EGD)  Vomiting of blood or coffee ground material  New chest pain or pain under the shoulder blades  Painful or persistently difficult swallowing  New shortness of breath  Fever of 100F or higher  Black, tarry-looking stools  For urgent or emergent issues, a gastroenterologist can be reached at any hour by calling [phone].   DIET:  We do recommend a small meal at first, but then you may proceed to your regular diet.  Drink plenty of fluids but you should avoid alcoholic beverages for 24 hours.  MEDICATIONS: Continue present medications. Use Protonix (pantoprazole) 20 mg by mouth daily. Ibuprofen may be a causative agent for gastritis.  Please see handouts given to you by your recovery nurse.  ACTIVITY:  You should plan to take it easy for the rest of today and you should NOT DRIVE or use heavy machinery until tomorrow (because of the sedation medicines used during the test).    FOLLOW UP: Our staff will call the number listed on your records the next business day following your procedure to check on you and address any questions or concerns that you may have regarding the information given to you following your procedure. If we do not reach you, we will leave a message.  However, if you are feeling well and you are not experiencing any problems, there is no need to return our call.  We will assume that you  have returned to your regular daily activities without incident.  If any biopsies were taken you will be contacted by phone or by letter within the next 1-3 weeks.  Please call us at [phone] if you have not heard about the biopsies in 3 weeks.   Thank you for allowing [place] to provide for your healthcare needs today.  SIGNATURES/CONFIDENTIALITY: You and/or your care partner have signed paperwork which will be entered into your electronic medical record.  These signatures attest to the fact that that the  information above on your After Visit Summary has been reviewed and is understood.  Full responsibility of the confidentiality of this discharge information lies with you and/or your care-partner.

## 2018-07-12 NOTE — Progress Notes (Signed)
Called to room to assist during endoscopic procedure.  Patient ID and intended procedure confirmed with present staff. Received instructions for my participation in the procedure from the performing physician.  

## 2018-07-12 NOTE — Progress Notes (Signed)
Pt's states no medical or surgical changes since previsit or office visit. 

## 2018-07-13 ENCOUNTER — Telehealth: Payer: Self-pay

## 2018-07-13 NOTE — Telephone Encounter (Signed)
  Follow up Call-  Call back number 07/12/2018 01/15/2016  Post procedure Call Back phone  # [phone]  Permission to leave phone message Yes Yes  Some recent data might be hidden     Patient questions:  Do you have a fever, pain , or abdominal swelling? No. Pain Score  0 *  Have you tolerated food without any problems? Yes.    Have you been able to return to your normal activities? Yes.    Do you have any questions about your discharge instructions: Diet   No. Medications  No. Follow up visit  No.  Do you have questions or concerns about your Care? No.  Actions: * If pain score is 4 or above: No action needed, pain <4.

## 2018-07-20 ENCOUNTER — Encounter: Payer: Self-pay | Admitting: Internal Medicine

## 2018-07-20 DIAGNOSIS — K2960 Other gastritis without bleeding: Secondary | ICD-10-CM | POA: Insufficient documentation

## 2018-07-20 HISTORY — DX: Other gastritis without bleeding: K29.60

## 2018-07-20 NOTE — Progress Notes (Signed)
Benign gastric erosions Stay on PPI  EGD recall 2 years (cirrhosis)

## 2018-07-21 ENCOUNTER — Other Ambulatory Visit: Payer: Self-pay | Admitting: Cardiology

## 2018-07-21 DIAGNOSIS — Z23 Encounter for immunization: Secondary | ICD-10-CM

## 2018-07-21 DIAGNOSIS — I5032 Chronic diastolic (congestive) heart failure: Secondary | ICD-10-CM

## 2018-07-21 DIAGNOSIS — I2510 Atherosclerotic heart disease of native coronary artery without angina pectoris: Secondary | ICD-10-CM

## 2018-07-21 DIAGNOSIS — I480 Paroxysmal atrial fibrillation: Secondary | ICD-10-CM

## 2018-07-21 DIAGNOSIS — I2583 Coronary atherosclerosis due to lipid rich plaque: Secondary | ICD-10-CM

## 2018-09-05 ENCOUNTER — Other Ambulatory Visit: Payer: Self-pay | Admitting: Cardiology

## 2018-09-05 DIAGNOSIS — I2583 Coronary atherosclerosis due to lipid rich plaque: Secondary | ICD-10-CM

## 2018-09-05 DIAGNOSIS — I2510 Atherosclerotic heart disease of native coronary artery without angina pectoris: Secondary | ICD-10-CM

## 2018-09-05 DIAGNOSIS — Z23 Encounter for immunization: Secondary | ICD-10-CM

## 2018-09-05 DIAGNOSIS — I5032 Chronic diastolic (congestive) heart failure: Secondary | ICD-10-CM

## 2018-09-05 DIAGNOSIS — I480 Paroxysmal atrial fibrillation: Secondary | ICD-10-CM

## 2018-09-12 ENCOUNTER — Telehealth: Payer: Self-pay | Admitting: *Deleted

## 2018-09-12 NOTE — Telephone Encounter (Signed)
t called states " I'M moving to [place] on 1/28. Could Dr. Julien Nordmann recommend anyone in the area of [place] that I could transfer my care to? Pt will be going to [facility]. Discussed with pt I will review with MD and call back with additional information

## 2018-09-13 ENCOUNTER — Telehealth: Payer: Self-pay | Admitting: *Deleted

## 2018-09-13 NOTE — Telephone Encounter (Signed)
Unfortunately, I do not know anyone in [place]. I wish him the best. Please cancel his future appointments. Thank you.

## 2018-09-13 NOTE — Telephone Encounter (Signed)
TCT patient with message from Dr. Julien Nordmann. Informed pt that Dr. Julien Nordmann does not know any oncologists in the area of [place] that pt is moving to @ the end of the month. Pt voiced understanding. Also told pt that Dr. Julien Nordmann wishes him the best of luck. Pt expressed his gratitude for all Dr. Julien Nordmann has done for him.  Future appts cancelled

## 2018-09-19 ENCOUNTER — Ambulatory Visit: Payer: PPO | Admitting: Cardiology

## 2018-09-19 ENCOUNTER — Encounter: Payer: Self-pay | Admitting: Cardiology

## 2018-09-19 VITALS — BP 122/72 | HR 82 | Ht 72.0 in | Wt 221.4 lb

## 2018-09-19 DIAGNOSIS — E785 Hyperlipidemia, unspecified: Secondary | ICD-10-CM

## 2018-09-19 DIAGNOSIS — Z789 Other specified health status: Secondary | ICD-10-CM | POA: Diagnosis not present

## 2018-09-19 DIAGNOSIS — I2510 Atherosclerotic heart disease of native coronary artery without angina pectoris: Secondary | ICD-10-CM

## 2018-09-19 DIAGNOSIS — I739 Peripheral vascular disease, unspecified: Secondary | ICD-10-CM

## 2018-09-19 NOTE — Progress Notes (Signed)
Cardiology Office Note:    Date:  09/19/2018   ID:  Joseph [name], Joseph [name] 08-Feb-1941, MRN [id number]  PCP:  [name], MD  Cardiologist:  Ena Dawley, MD   Referring MD: [name], MD   Chief complain: 6 months follow up  History of Present Illness:    Joseph [name] is a 78 y.o. male with a hx of hyperlipidemia, known nonobstructive coronary artery disease was originally referred to [place] for preop evaluation prior to 4.4 cm AAA repair. Approximately 10 years ago he was evaluated for coronary artery disease because of abnormal EKG that was followed by abnormal stress test and a cardiac catheterization that only showed minimal atherosclerotic plaque. The patient also has a history of lung cancer for which she has been treated with chemotherapy and radiation therapy in 2012 and is currently in remission. The patient quit smoking 5 years ago and is being treated for COPD and emphysema with Spiriva.  In 2015 he underwent Aorto to left and right external iliac bypass, left internal iliac artery bypass, ligation right internal iliac aneurysm, reimplantation of inferior mesenteric artery. Thrombectomy of left and right iliac and femoral arteries.  He has known hyperlipidemia however he developed liver enzymes elevation after his chemotherapy and is being followed a GI specialist Dr. Charlott Holler, he has never been started on statins. He denies any alcohol intake.  Today he states that he feels great, he has not had time to exercise regularly as he has been busy preparing for his move to [place].  He recently sold his house.  He denies any chest pain or shortness of breath, no lower extremity edema or claudications.  No dizziness or syncope.  He has been compliant with his meds.  He was approved for Repatha however he decided not to start as he is moving and will try to get it from a new provider in [place].  Past Medical History:  Diagnosis Date  . AAA (abdominal aortic aneurysm) ([place])     . Acute on chronic diastolic CHF (congestive heart failure), NYHA class 3 ([place]) 07/23/2014  . Anemia   . Arthritis    left knee and back arthrtis  . Atrial fibrillation with rapid ventricular response ([place]) 07/04/2014  . CAD (coronary artery disease)   . Cataract    surgery to remove  . Cirrhosis ([place])    By radiography, seen on CT  . COPD (chronic obstructive pulmonary disease) ([place])   . Diverticulosis   . Emphysema   . Emphysema of lung ([place])   . Enlarged prostate   . Erosive gastritis - probably due to NSAID 07/20/2018  . GERD (gastroesophageal reflux disease)    diet  controlled, no meds  . Head injury    teenager  . Headache    when younger but no longer an issue  . Heart murmur    teen  . History of abdominal aortic aneurysm (AAA)   . History of blood transfusion    with AAA surgery  . History of colon polyps 2006,2008  . History of transfusion 2015  . Hx of radiation therapy 09/02/11 to 10/19/11   chest  . Hx of radiation therapy   . Hyperlipidemia    no meds, diet controlled  . Hypothyroidism    takes Synthroid daily  . Inguinal hernia   . Lung cancer ([place]) 07/2011   right upper lobe, non -small cell lung cancer, did chemo  . MI (myocardial infarction) [facility])    old MI on EKG  .  Personal history of adenomatous colonic polyps 07/04/2012   2006, adenomatous right colon polyp removed, then ablated in 2007 followup, 2008, adenomatous polyp of the right colon removed with hot biopsy forceps. All by Dr. Lajoyce Corners 01/03/2013 diminutive polyp ascending colon - adenoma    . Rosacea   . Shortness of breath    with exertion  . Small bowel obstruction ([place]) 07/05/2014  . Status post chemotherapy    last chemo 2013  . Thrombocytopenia ([place])   . Umbilical hernia, incarcerated- surgery 07/10/14 07/05/2014    Past Surgical History:  Procedure Laterality Date  . ABDOMINAL AORTAGRAM  10/20/2013   Procedure: ABDOMINAL Maxcine Ham;  Surgeon: Elam Dutch, MD;  Location[facility] CATH LAB;   Service: Cardiovascular;;  . ABDOMINAL AORTIC ANEURYSM REPAIR N/A 10/31/2013   Procedure: aorto to left external iliac and right external iliac and left internal iliac, reimplantation of inferior mesenteric artery and ligation of left iliac artery aneursym;  Surgeon: Elam Dutch, MD;  Location: [place];  Service: Vascular;  Laterality: N/A;  . ABDOMINAL WOUND DEHISCENCE N/A 11/10/2013   Procedure: ABDOMINAL WOUND CLOSURE ;  Surgeon: Serafina Mitchell, MD;  Location: [place];  Service: Vascular;  Laterality: N/A;  . bletharoplasty    . BOWEL RESECTION N/A 07/10/2014   Procedure: SMALL BOWEL RESECTION;  Surgeon: [name], MD;  Location: [place];  Service: General;  Laterality: N/A;  . CARDIAC CATHETERIZATION  2003  . CATARACT EXTRACTION, BILATERAL Bilateral    x 2  . COLONOSCOPY W/ BIOPSIES     multiple   . ESOPHAGOGASTRODUODENOSCOPY  2006  . FINGER SURGERY  2008   left pointer finger  . HERNIA REPAIR  70's   x3 2 Inguinal  . INCISIONAL HERNIA REPAIR N/A 07/10/2014   Procedure: HERNIA REPAIR INCISIONAL;  Surgeon: [name], MD;  Location: [place] [place];  Service: General;  Laterality: N/A;  . JOINT REPLACEMENT    . KNEE ARTHROSCOPY  2008   left knee  . LAPAROTOMY N/A 07/10/2014   Procedure: EXPLORATORY LAPAROTOMY;  Surgeon: [name], MD;  Location: [facility];  Service: General;  Laterality: N/A;  . LOWER EXTREMITY ANGIOGRAM Bilateral 10/20/2013   Procedure: LOWER EXTREMITY ANGIOGRAM;  Surgeon: Elam Dutch, MD;  Location: [facility] CATH LAB;  Service: Cardiovascular;  Laterality: Bilateral;  . LYSIS OF ADHESION N/A 07/10/2014   Procedure: LYSIS OF ADHESION - 90 minutes;  Surgeon: [name], MD;  Location: [place];  Service: General;  Laterality: N/A;  . PORT-A-CATH REMOVAL Left 01/19/2018   Procedure: REMOVAL PORT-A-CATH;  Surgeon: Grace Isaac, MD;  Location: [place];  Service: Thoracic;  Laterality: Left;  . PORTACATH PLACEMENT  08/10/2011   Procedure: INSERTION PORT-A-CATH;  Surgeon: Pierre Bali, MD;  Location: [place];  Service: Thoracic;  Laterality: Left;  Left Subclavian  . PTOSIS REPAIR Bilateral 12-02-2015  . THROMBECTOMY ILIAC ARTERY Bilateral 10/31/2013   Procedure: THROMBECTOMY ILIAC ARTERY;  Surgeon: Elam Dutch, MD;  Location: [place];  Service: Vascular;  Laterality: Bilateral;  . TONSILLECTOMY     as a child  . TOTAL KNEE ARTHROPLASTY Left 08/03/2017   Procedure: LEFT TOTAL KNEE ARTHROPLASTY;  Surgeon: [name], MD;  Location: [place];  Service: Orthopedics;  Laterality: Left;  Marland Kitchen VASECTOMY  70's  . VENTRAL HERNIA REPAIR N/A 04/04/2015   Procedure: OPEN REPAIR OF RECURRENT ABDOMINAL WALL HERNIA WITH MESH, COMPLEX LYSIS OF ADHESIONS X1 HR TAR COMPONENT SEPARATION;  Surgeon: Greer Pickerel, MD;  Location: [facility];  Service: General;  Laterality: N/A;    Current Medications: Current Meds  Medication Sig  . aspirin EC 81 MG tablet Take 81 mg by mouth daily.  . furosemide (LASIX) 20 MG tablet TAKE 1 TABLET BY MOUTH EVERY DAY  . ibuprofen (ADVIL,MOTRIN) 200 MG tablet Take 400 mg by mouth daily as needed (back pain).  . metoprolol tartrate (LOPRESSOR) 25 MG tablet TAKE 1 TABLET BY MOUTH TWICE A DAY  . metroNIDAZOLE (METROCREAM) 0.75 % cream Apply 1 application topically 2 (two) times daily as needed (jock itch).   . Multiple Vitamin (MULTIVITAMIN WITH MINERALS) TABS tablet Take 1 tablet by mouth 2 (two) times daily.   . Multiple Vitamins-Minerals (PRESERVISION AREDS 2+MULTI VIT PO) Take 1 capsule by mouth 2 (two) times daily.  Marland Kitchen nystatin cream (MYCOSTATIN) Apply 1 application topically 2 (two) times daily as needed (jock itch).   . OVER THE COUNTER MEDICATION Take 1 capsule by mouth daily.  . pantoprazole (PROTONIX) 20 MG tablet Take 1 tablet (20 mg total) by mouth daily before breakfast.  . Probiotic Product (PROBIOTIC DAILY PO) Take 1 capsule by mouth daily. Alfalfa sildenafil (REVATIO) 20 MG tablet Take 1-5 tablets by mouth as needed.  Marland Kitchen Specialty  Vitamins Products (PROSTATE PO) Take 1 tablet by mouth 2 (two) times daily.  Marland Kitchen SYNTHROID 175 MCG tablet Take 175 mcg by mouth daily before breakfast.   . tiotropium (SPIRIVA) 18 MCG inhalation capsule Place 18 mcg into inhaler and inhale daily.   Marland Kitchen triamcinolone cream (KENALOG) 0.1 % Apply 1 application topically 3 times/day as needed-between meals & bedtime (jock itch).    Current Facility-Administered Medications for the 09/19/18 encounter (Office Visit) with Dorothy Spark, MD  Medication  . 0.9 %  sodium chloride infusion     Allergies:   Patient has no known allergies.   Social History   Socioeconomic History  . Marital status: Married    Spouse name: Not on file  . Number of children: 2  . Years of education: Not on file  . Highest education level: Not on file  Occupational History  . Occupation: retired  Scientific laboratory technician  . Financial resource strain: Not on file  . Food insecurity:    Worry: Not on file    Inability: Not on file  . Transportation needs:    Medical: Not on file    Non-medical: Not on file  Tobacco Use  . Smoking status: Former Smoker    Packs/day: 1.50    Years: 40.00    Pack years: 60.00    Types: Cigarettes    Last attempt to quit: 07/02/2011    Years since quitting: 7.2  . Smokeless tobacco: Former Systems developer    Types: Snuff  Substance and Sexual Activity  . Alcohol use: Yes    Comment: social - 2 MONTHLY  . Drug use: No  . Sexual activity: Not Currently  Lifestyle  . Physical activity:    Days per week: Not on file    Minutes per session: Not on file  . Stress: Not on file  Relationships  . Social connections:    Talks on phone: Not on file    Gets together: Not on file    Attends religious service: Not on file    Active member of club or organization: Not on file    Attends meetings of clubs or organizations: Not on file    Relationship status: Not on file  Other Topics  Concern  . 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 There is no history of Anesthesia problems, Hypotension, Malignant hyperthermia, Pseudochol deficiency, Colon cancer, Rectal cancer, or Stomach cancer. ROS:   Please see the history of present illness.    All other systems reviewed and are negative.  EKGs/Labs/Other Studies Reviewed:    The following studies were reviewed today:  EKG:  EKG is  ordered today.  The ekg ordered today demonstrates Sinus rhythm normal EKG unchanged from prior, this was personally reviewed.  Recent Labs: 01/19/2018: ALT 21; BUN 12; Creatinine, Ser 0.93; Hemoglobin 13.7; Platelets 106; Potassium 3.8; Sodium 142  Recent Lipid Panel    Component Value Date/Time   CHOL 188 10/24/2014 1313   TRIG 195 (H) 10/24/2014 1313   HDL 35 (L) 10/24/2014 1313   LDLCALC 114 (H) 10/24/2014 1313    Physical Exam:    VS:  BP 122/72   Pulse 82   Ht 6' (1.829 m)   Wt 221 lb 6.4 oz (100.4 kg)   SpO2 94%   BMI 30.03 kg/m     Wt Readings from Last 3 Encounters:  09/19/18 221 lb 6.4 oz (100.4 kg)  07/12/18 215 lb (97.5 kg)  07/08/18 215 lb (97.5 kg)    GEN: Well nourished, well developed in no acute distress HEENT: Normal NECK: No JVD; No carotid bruits LYMPHATICS: No lymphadenopathy CARDIAC: RRR, no murmurs, rubs, gallops RESPIRATORY:  Clear to auscultation without rales, wheezing or rhonchi  ABDOMEN: Soft, non-tender, non-distended MUSCULOSKELETAL:  No edema; No deformity  SKIN:  Warm and dry NEUROLOGIC:  Alert and oriented x 3 PSYCHIATRIC:  Normal affect   ASSESSMENT:    1. Coronary artery disease involving native coronary artery of native heart without angina pectoris   2. PAD (peripheral artery disease) ([place])   3. Hyperlipidemia, unspecified hyperlipidemia type   4. Statin intolerance    PLAN:    In order of problems listed above:  1. Hyperlipidemia - with known severe PAD and non-obstructive CAD, he is intolerant to statins sec to LFTs elevation and h/o cirrhosis.  He was approved for Repatha however he wants to wait until he moves to [place] and have it prescribed there.   2. Nonobstructive CAD -we will continue secondary prevention with aspirin and metoprolol.  He should be started on Repatha soon. 3. Hypertension -well controlled 4. Peripheral arterial disease -he is asymptomatic.  Follow-up with a new provider in [place].  We will fax his records once we know new office number.  Medication Adjustments/Labs and Tests Ordered: Current medicines are reviewed at length with the patient today.  Concerns regarding medicines are outlined above.  No orders of the defined types were placed in this encounter.  No orders of the defined types were placed in this encounter.   Signed, Ena Dawley, MD  09/19/2018 2:55 PM    [place]

## 2018-09-19 NOTE — Patient Instructions (Signed)
Medication Instructions:   Your physician recommends that you continue on your current medications as directed. Please refer to the Current Medication list given to you today.    Follow-Up:  AS NEEDED WITH DR NELSON--GOOD LUCK ON YOUR MOVE WE WILL MISS YOU       If you need a refill on your cardiac medications before your next appointment, please call your pharmacy.

## 2018-09-21 ENCOUNTER — Other Ambulatory Visit: Payer: Self-pay | Admitting: Cardiology

## 2018-09-21 DIAGNOSIS — Z23 Encounter for immunization: Secondary | ICD-10-CM

## 2018-09-21 DIAGNOSIS — I5032 Chronic diastolic (congestive) heart failure: Secondary | ICD-10-CM

## 2018-09-21 DIAGNOSIS — I2510 Atherosclerotic heart disease of native coronary artery without angina pectoris: Secondary | ICD-10-CM

## 2018-09-21 DIAGNOSIS — I2583 Coronary atherosclerosis due to lipid rich plaque: Secondary | ICD-10-CM

## 2018-09-21 DIAGNOSIS — I480 Paroxysmal atrial fibrillation: Secondary | ICD-10-CM

## 2018-09-30 IMAGING — DX DG ABDOMEN 2V
3 series · 3 of 3 positions shown · non-contrast
Comparison: 10/21/2016 radiographs and 10/20/2016 CT

CLINICAL DATA: Followup small bowel obstruction.

EXAM:
ABDOMEN - 2 VIEW

[abdomen erect (1 of 2)]
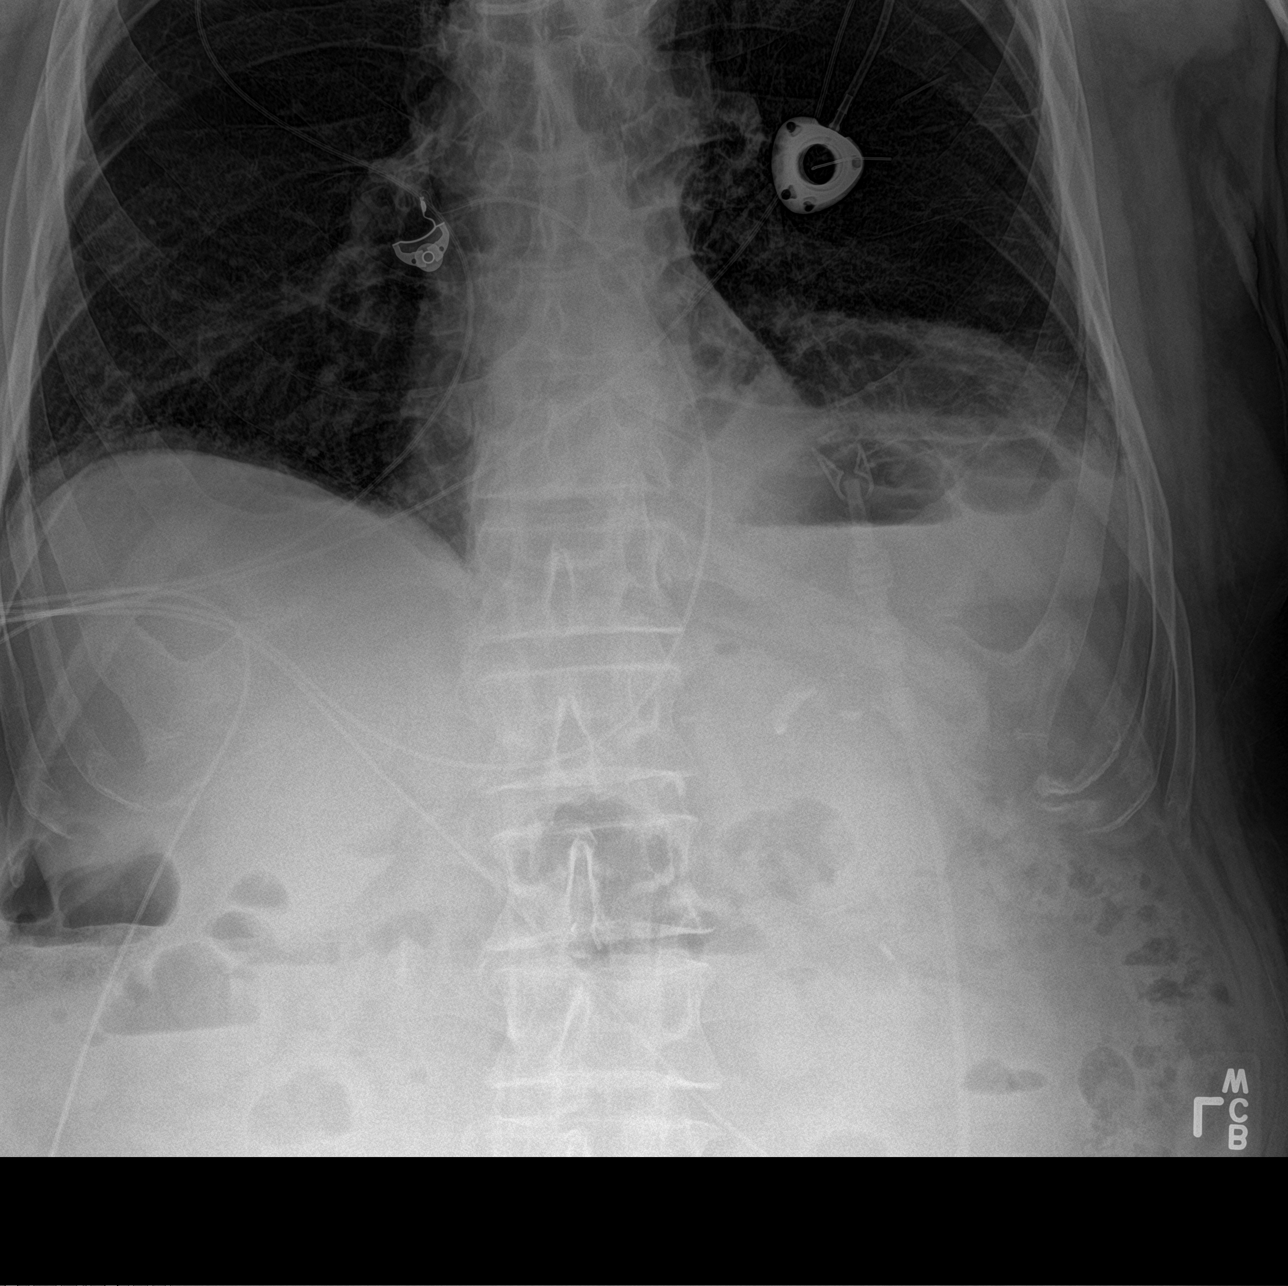

[abdomen supine]
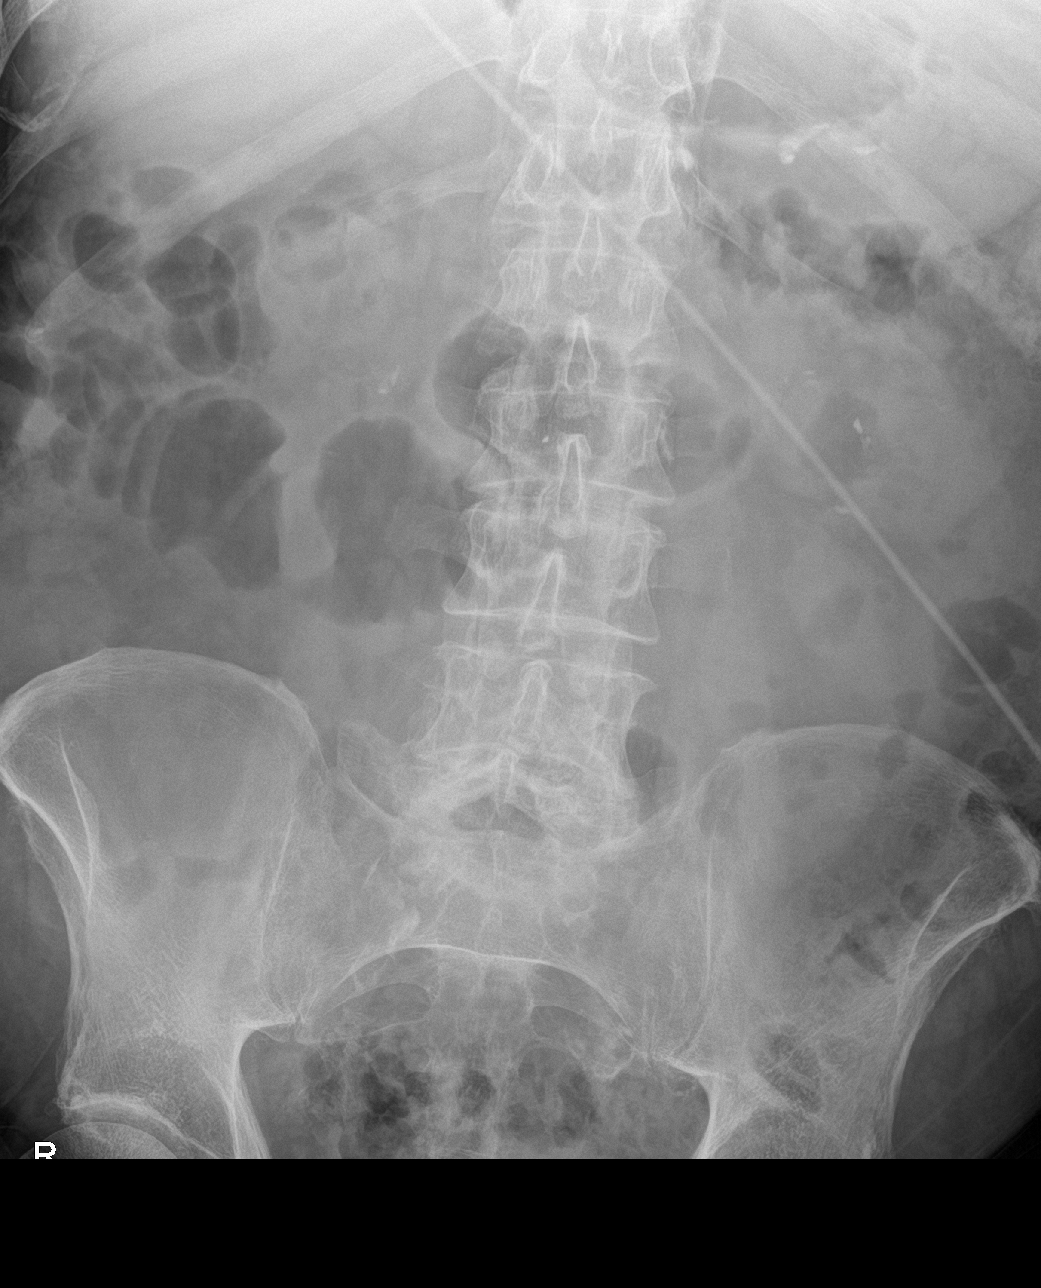

[abdomen erect (2 of 2)]
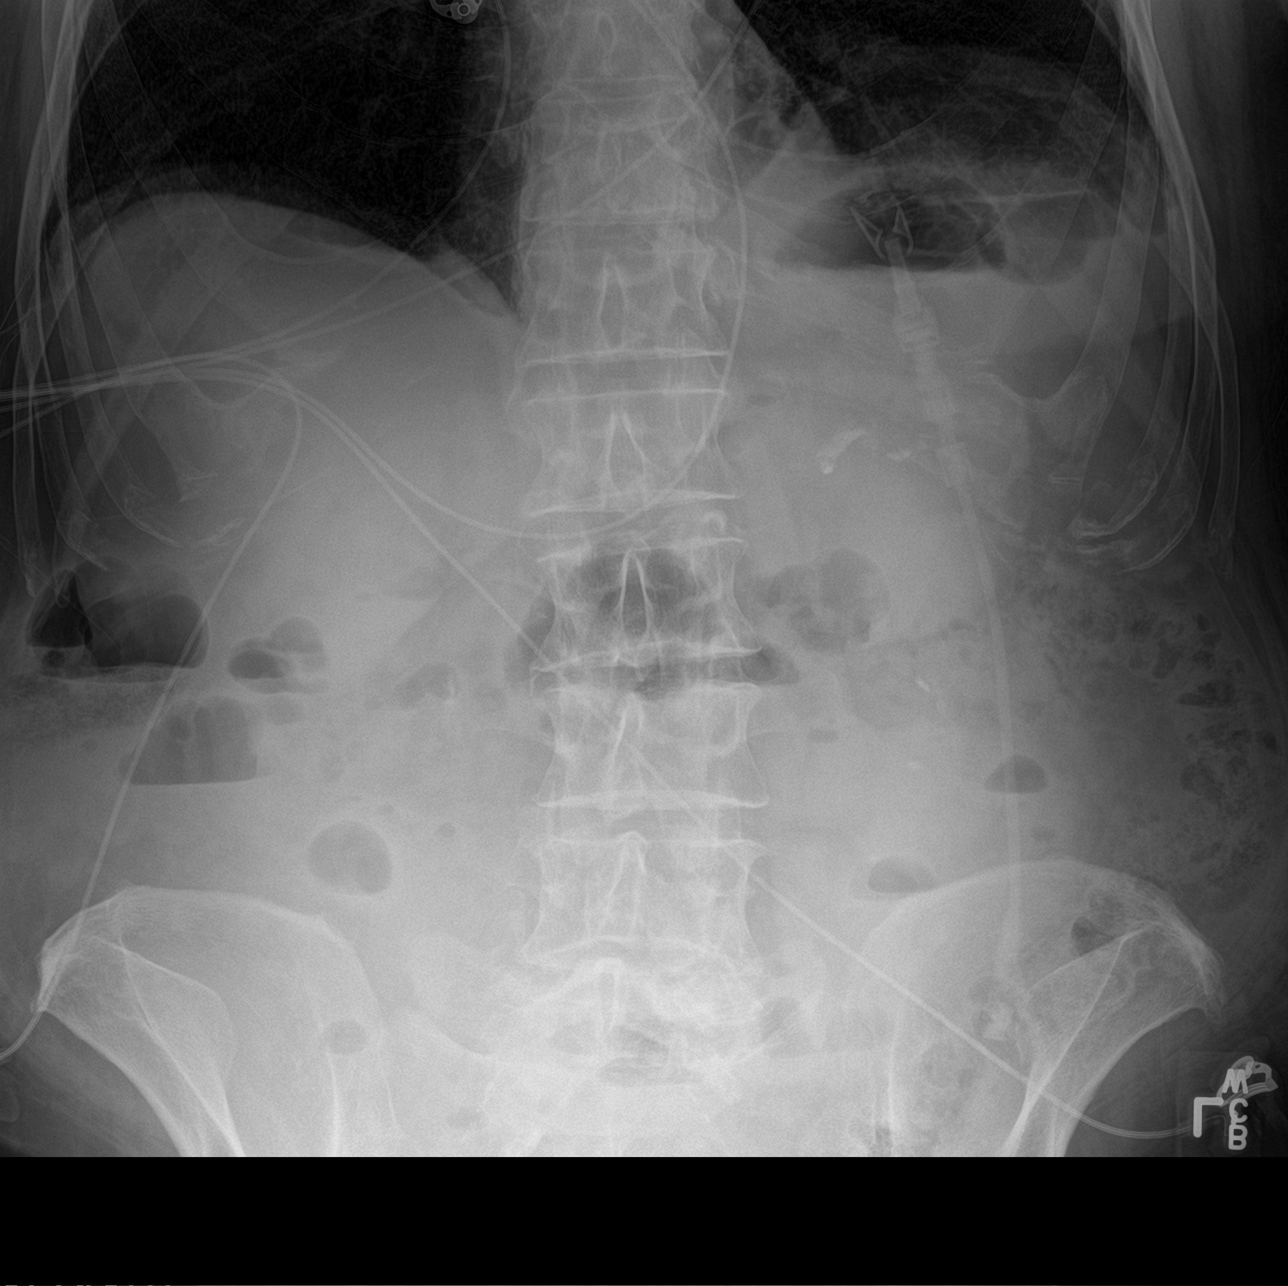

[3 of 3 positions shown; findings below may reference images not displayed]

FINDINGS: Small bowel distention has decreased with only a few mildly
distended gas and fluid-filled small bowel loops remaining. There is
no evidence of pneumoperitoneum. No other significant changes are
identified.
IMPRESSION: Decreased small bowel distension compatible with improving small
bowel obstruction.

## 2018-11-28 ENCOUNTER — Telehealth: Payer: Self-pay | Admitting: Internal Medicine

## 2018-11-28 NOTE — Telephone Encounter (Signed)
Faxed medical records to [place] [phone], Release [id number]

## 2019-01-06 ENCOUNTER — Other Ambulatory Visit: Payer: PPO

## 2019-01-06 ENCOUNTER — Ambulatory Visit (HOSPITAL_COMMUNITY): Payer: PPO

## 2019-01-09 ENCOUNTER — Ambulatory Visit: Payer: PPO | Admitting: Internal Medicine

## 2019-01-11 ENCOUNTER — Encounter: Payer: Self-pay | Admitting: Internal Medicine

## 2019-02-25 IMAGING — DX DG CHEST 2V
2 series · 2 of 2 positions shown · non-contrast
Comparison: 01/06/2017

CLINICAL DATA: Respiratory failure.  History of lung cancer

EXAM:
CHEST  2 VIEW

[chest pa]
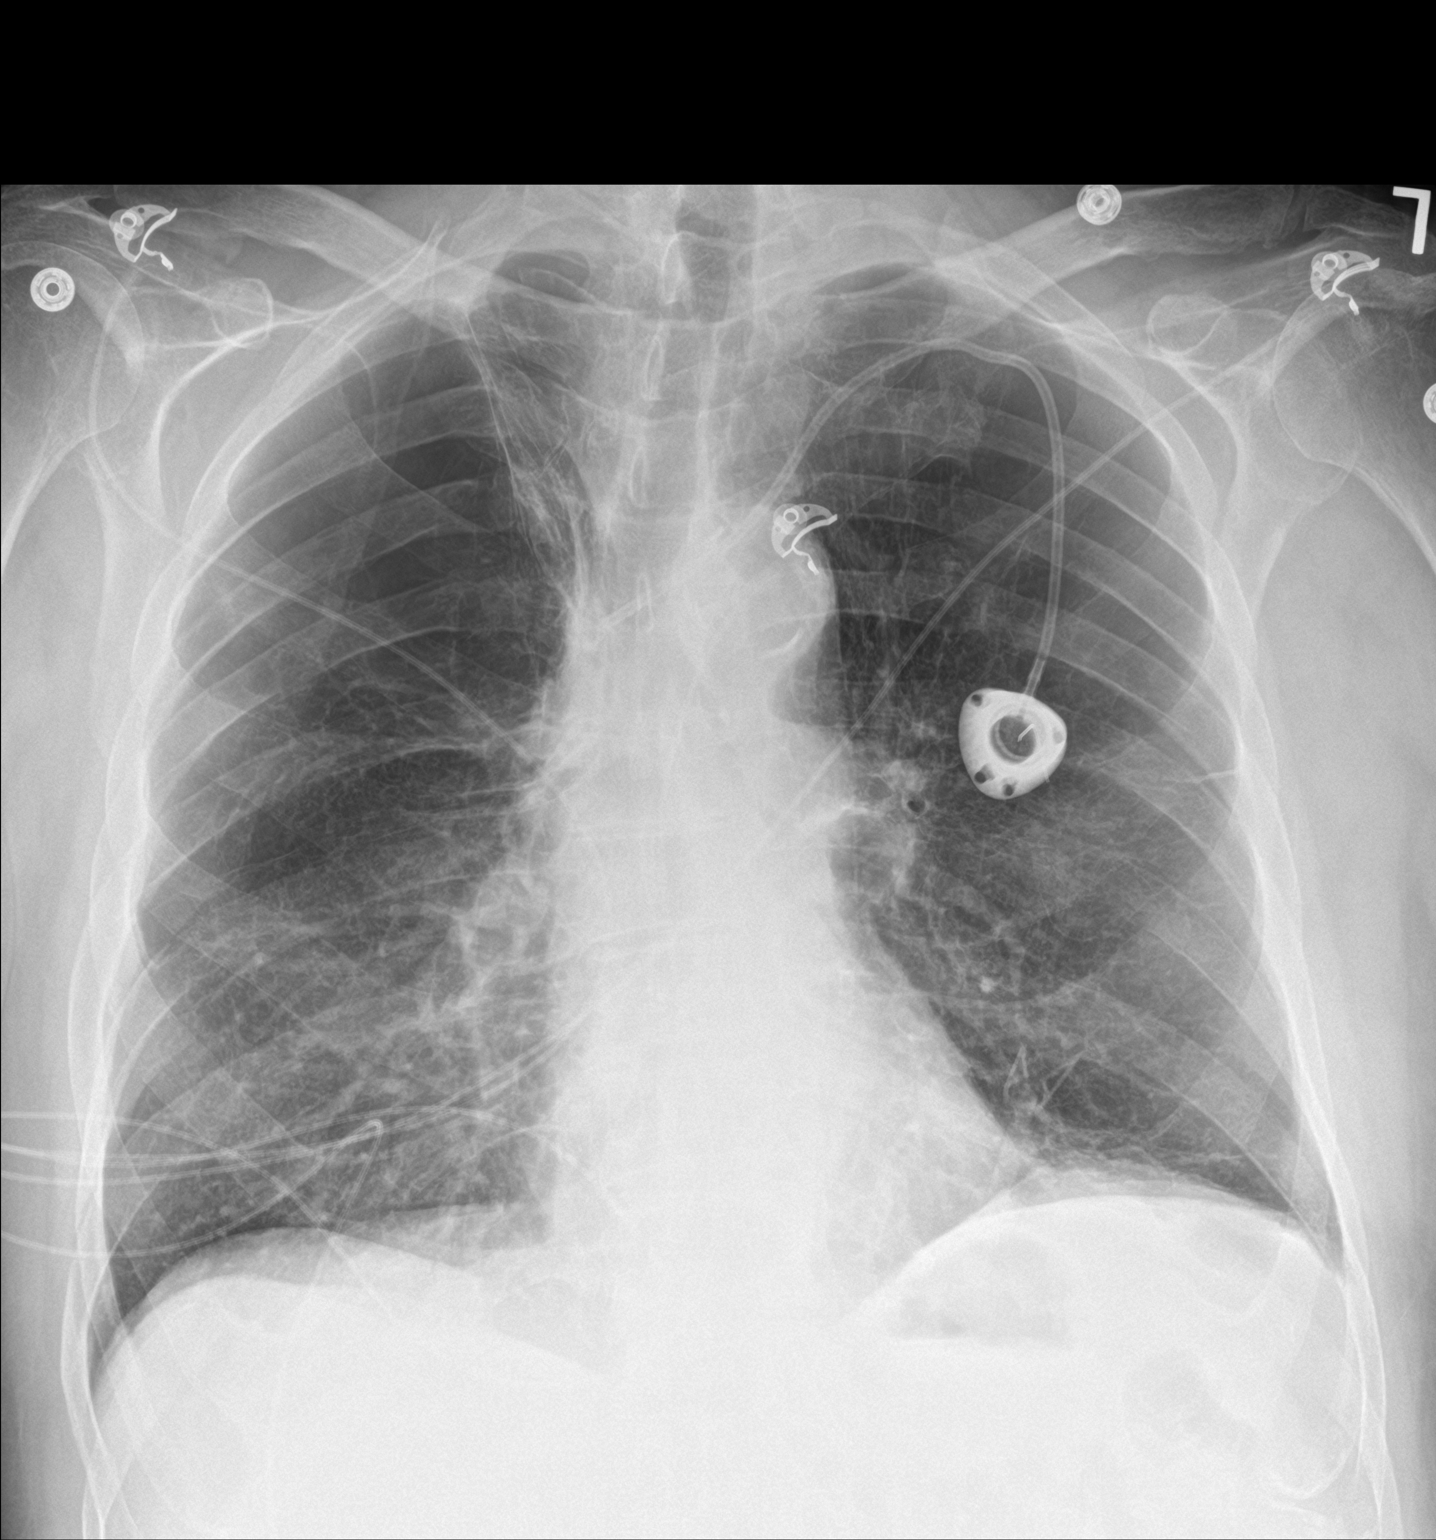

[chest lat]
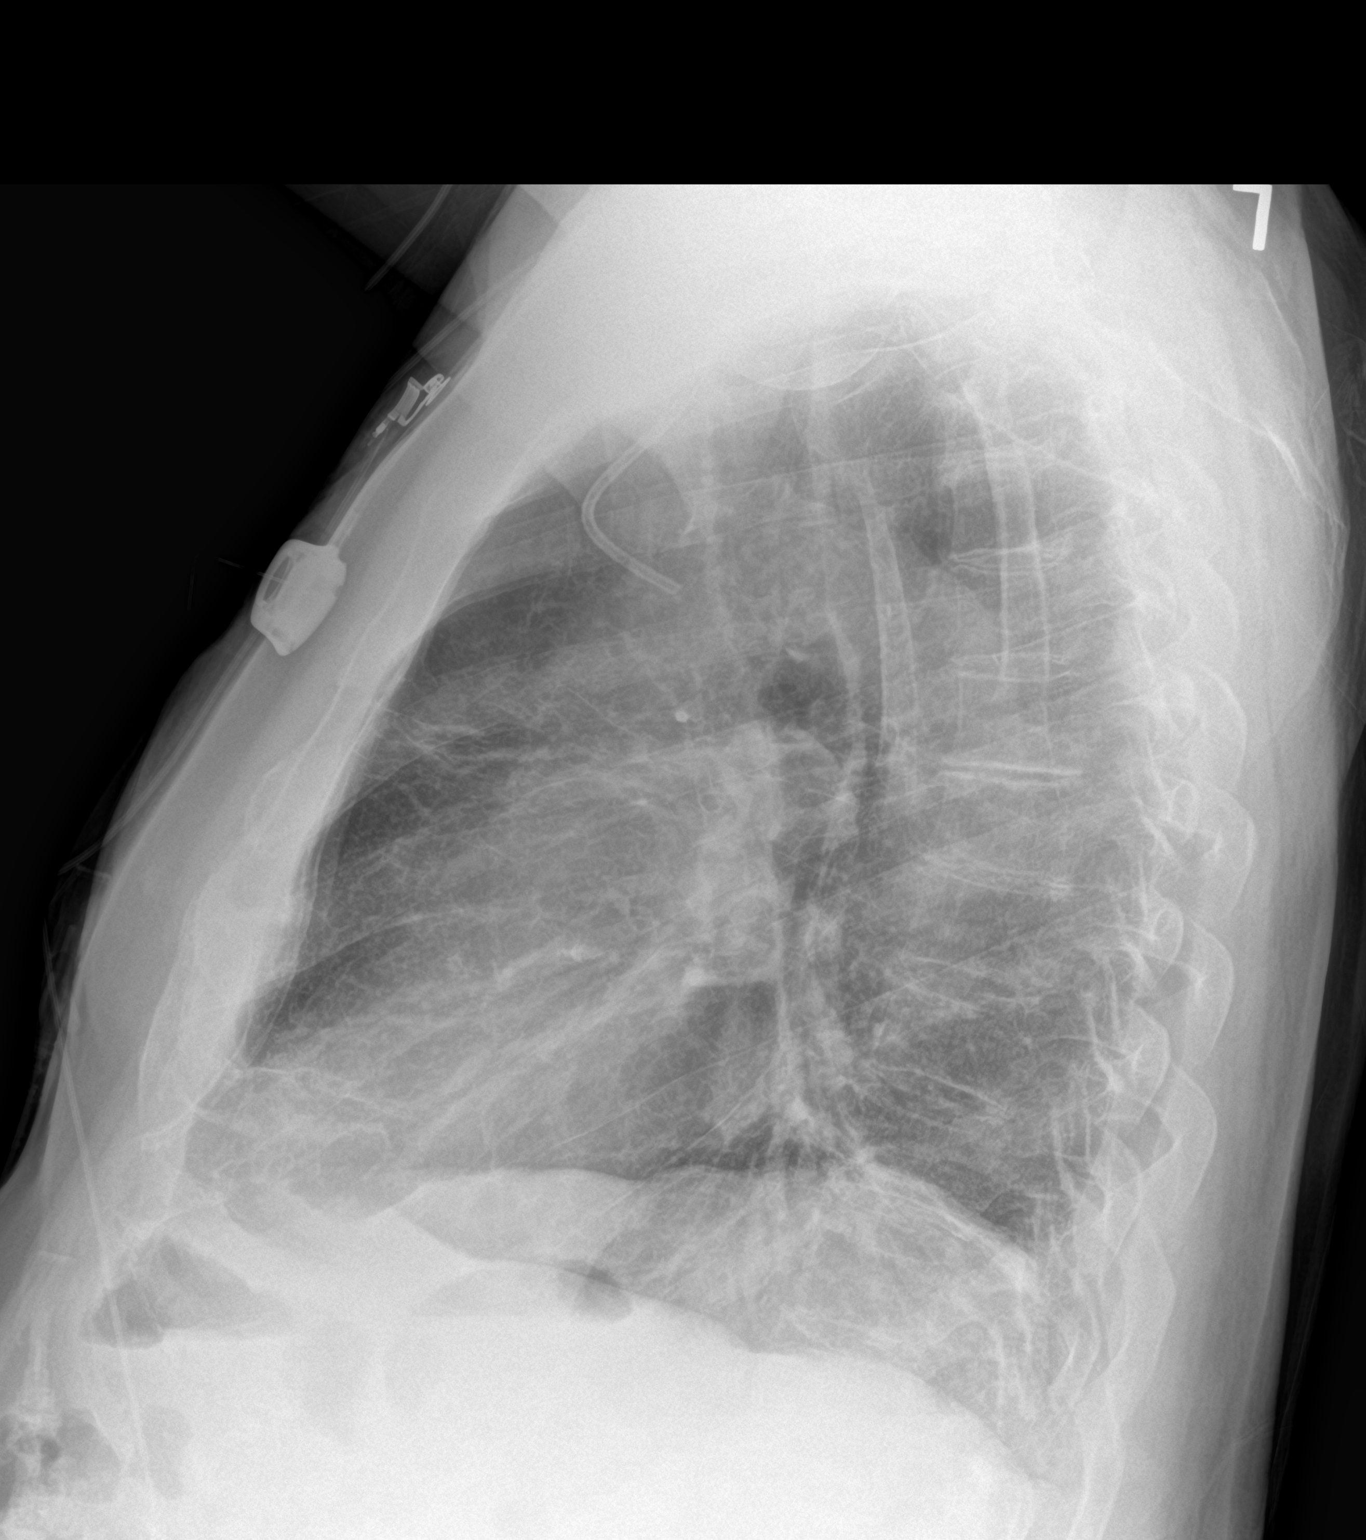

[2 of 2 positions shown; findings below may reference images not displayed]

FINDINGS: Port-A-Cath tip:  Brachiocephalic confluence.

Atherosclerotic calcification of the aortic arch. Severe emphysema.
Scarring at the left lung base. Scarring medially in the right upper
lobe. Heart size within normal limits. Thoracic spondylosis. No
pleural effusion. Chronic interstitial accentuation.
IMPRESSION: 1. Stable scarring of the right lung apex and left lung base.
2. Aortic Atherosclerosis (K4MMS-BBC.C) and Emphysema (K4MMS-BU8.7).

## 2019-03-06 ENCOUNTER — Telehealth: Payer: Self-pay | Admitting: Medical Oncology

## 2019-03-06 NOTE — Telephone Encounter (Signed)
Faxed path report  ,(specifically tissue type) to Dr Dr Jackelyn Knife in [place], [facility] oncology

## 2019-03-06 NOTE — Telephone Encounter (Signed)
Requested to send path report with tissue type to MD in [place].

## 2019-11-16 ENCOUNTER — Other Ambulatory Visit: Payer: Self-pay

## 2019-11-16 MED ORDER — PANTOPRAZOLE SODIUM 20 MG PO TBEC: 20.0000 mg | DELAYED_RELEASE_TABLET | Freq: Every day | ORAL | 0 refills | Status: DC

## 2019-11-16 NOTE — Telephone Encounter (Signed)
Pantoprazole refilled as pharmacy requested. 

## 2020-12-19 ENCOUNTER — Ambulatory Visit: Payer: Medicare Other | Admitting: Vascular Surgery

## 2022-12-30 DEATH — deceased

## 2023-02-09 ENCOUNTER — Encounter: Payer: Self-pay | Admitting: Internal Medicine
